# Patient Record
Sex: Male | Born: 1940 | Race: White | Hispanic: No | Smoking: Former smoker
Health system: Southern US, Community
[De-identification: ages and names within clinical notes are randomized; demographics above are authoritative.]

## PROBLEM LIST (undated history)

## (undated) DIAGNOSIS — I10 Essential (primary) hypertension: Secondary | ICD-10-CM

## (undated) DIAGNOSIS — E785 Hyperlipidemia, unspecified: Secondary | ICD-10-CM

## (undated) DIAGNOSIS — N289 Disorder of kidney and ureter, unspecified: Secondary | ICD-10-CM

## (undated) DIAGNOSIS — K219 Gastro-esophageal reflux disease without esophagitis: Secondary | ICD-10-CM

## (undated) DIAGNOSIS — R0600 Dyspnea, unspecified: Secondary | ICD-10-CM

## (undated) DIAGNOSIS — T4145XA Adverse effect of unspecified anesthetic, initial encounter: Secondary | ICD-10-CM

## (undated) DIAGNOSIS — T8859XA Other complications of anesthesia, initial encounter: Secondary | ICD-10-CM

## (undated) DIAGNOSIS — E119 Type 2 diabetes mellitus without complications: Secondary | ICD-10-CM

## (undated) DIAGNOSIS — J449 Chronic obstructive pulmonary disease, unspecified: Secondary | ICD-10-CM

## (undated) DIAGNOSIS — I509 Heart failure, unspecified: Secondary | ICD-10-CM

## (undated) DIAGNOSIS — C801 Malignant (primary) neoplasm, unspecified: Secondary | ICD-10-CM

## (undated) DIAGNOSIS — N189 Chronic kidney disease, unspecified: Secondary | ICD-10-CM

## (undated) DIAGNOSIS — N186 End stage renal disease: Secondary | ICD-10-CM

## (undated) DIAGNOSIS — T884XXA Failed or difficult intubation, initial encounter: Secondary | ICD-10-CM

## (undated) DIAGNOSIS — J90 Pleural effusion, not elsewhere classified: Secondary | ICD-10-CM

## (undated) HISTORY — DX: Chronic obstructive pulmonary disease, unspecified: J44.9

## (undated) HISTORY — PX: APPENDECTOMY: SHX54

## (undated) HISTORY — DX: Hyperlipidemia, unspecified: E78.5

## (undated) HISTORY — DX: Heart failure, unspecified: I50.9

## (undated) HISTORY — PX: JOINT REPLACEMENT: SHX530

## (undated) HISTORY — DX: Type 2 diabetes mellitus without complications: E11.9

## (undated) HISTORY — DX: Chronic kidney disease, unspecified: N18.9

## (undated) HISTORY — DX: Essential (primary) hypertension: I10

## (undated) HISTORY — PX: PROSTATE SURGERY: SHX751

## (undated) HISTORY — PX: DIALYSIS/PERMA CATHETER INSERTION: CATH118288

## (undated) HISTORY — DX: Malignant (primary) neoplasm, unspecified: C80.1

## (undated) HISTORY — PX: OTHER SURGICAL HISTORY: SHX169

---

## 2016-04-23 ENCOUNTER — Ambulatory Visit (INDEPENDENT_AMBULATORY_CARE_PROVIDER_SITE_OTHER): Payer: Medicare Other | Admitting: Vascular Surgery

## 2016-04-23 ENCOUNTER — Encounter (INDEPENDENT_AMBULATORY_CARE_PROVIDER_SITE_OTHER): Payer: Self-pay | Admitting: Vascular Surgery

## 2016-04-23 VITALS — BP 156/60 | HR 75 | Resp 14 | Ht 67.5 in | Wt 234.0 lb

## 2016-04-23 DIAGNOSIS — Z992 Dependence on renal dialysis: Secondary | ICD-10-CM | POA: Diagnosis not present

## 2016-04-23 DIAGNOSIS — I10 Essential (primary) hypertension: Secondary | ICD-10-CM | POA: Diagnosis not present

## 2016-04-23 DIAGNOSIS — N186 End stage renal disease: Secondary | ICD-10-CM | POA: Diagnosis not present

## 2016-04-23 DIAGNOSIS — E785 Hyperlipidemia, unspecified: Secondary | ICD-10-CM | POA: Diagnosis not present

## 2016-04-23 DIAGNOSIS — E1122 Type 2 diabetes mellitus with diabetic chronic kidney disease: Secondary | ICD-10-CM | POA: Diagnosis not present

## 2016-04-23 DIAGNOSIS — Z794 Long term (current) use of insulin: Secondary | ICD-10-CM

## 2016-04-23 NOTE — Assessment & Plan Note (Signed)
blood pressure control important in reducing the progression of atherosclerotic disease. On appropriate oral medications.  

## 2016-04-23 NOTE — Assessment & Plan Note (Signed)
Recommend:  At this time the patient does not have appropriate extremity access for dialysis  We will obtain a vein mapping for further evaluation of his options for fistula or graft creation. We discussed the differences between fistulas and grafts. We discussed while both are superior to catheter-based therapies.  The risks, benefits and alternative therapies were reviewed in detail with the patient.  All questions were answered.  The patient agrees to proceed with evaluation for access surgery.

## 2016-04-23 NOTE — Patient Instructions (Signed)
AV Fistula Placement Introduction Arteriovenous (AV) fistula placement is a surgical procedure to create a connection between a blood vessel that carries blood away from your heart (artery) and a blood vessel that returns blood to your heart (vein). The connection is called a fistula. It is often made in the forearm or upper arm. You may need this procedure if you are getting hemodialysis treatments for kidney disease. An AV fistula makes your vein larger and stronger over several months. This makes the vein a safe and easy spot to insert the needles that are used for hemodialysis. Tell a health care provider about:  Any allergies you have.  All medicines you are taking, including vitamins, herbs, eye drops, creams, and over-the-counter medicines.  Any problems you or family members have had with anesthetic medicines.  Any blood disorders you have.  Any surgeries you have had.  Any medical conditions you have. What are the risks? Generally, this is a safe procedure. However, problems may occur, including:  Infection.  Blood clot (thrombosis).  Reduced blood flow (stenosis).  Weakening or ballooning out of the fistula (aneurysm).  Bleeding.  Allergic reactions to medicines.  Nerve damage.  Swelling near the fistula (lymphedema).  Weakening of your heart (congestive heart failure).  Failure of the procedure. What happens before the procedure?  Imaging tests of your arm may be done to find the best place for the fistula.  Ask your health care provider about:  Changing or stopping your regular medicines. This is especially important if you are taking diabetes medicines or blood thinners.  Taking medicines such as aspirin and ibuprofen. These medicines can thin your blood. Do not take these medicines before your procedure if your health care provider instructs you not to.  Follow instructions from your health care provider about eating or drinking restrictions.  You may  be given antibiotic medicine to help prevent infection.  Ask your health care provider how your surgical site will be marked or identified.  Plan to have someone take you home after the procedure. What happens during the procedure?  To reduce your risk of infection:  Your health care team will wash or sanitize their hands.  Your skin will be washed with soap.  Hair may be removed from the surgical area.  An IV tube will be started in one of your veins.  You will be given one or more of the following:  A medicine to help you relax (sedative).  A medicine to numb the area (local anesthetic).  A medicine to make you fall asleep (general anesthetic).  A medicine that is injected into an area of your body to numb everything below the injection site (regional anesthetic).  The fistula site will be cleaned with a germ-killing solution (antiseptic).  A cut (incision) will be made on the inner side of your arm.  A vein and an artery will be opened and connected with stitches (sutures).  The incision will be closed with sutures or clips.  A bandage (dressing) will be placed over the area. The procedure may vary among health care providers and hospitals. What happens after the procedure?  Your blood pressure, heart rate, breathing rate, and blood oxygen level will be monitored often until the medicines you were given have worn off.  Your fistula site will be checked for bleeding or swelling.  You will be given pain medication as needed.  Do not drive for 24 hours if you received a sedative. This information is not intended to replace   advice given to you by your health care provider. Make sure you discuss any questions you have with your health care provider. Document Released: 02/27/2015 Document Revised: 08/24/2015 Document Reviewed: 06/08/2014  2017 Elsevier  

## 2016-04-23 NOTE — Progress Notes (Signed)
Patient ID: Calvin Good, male   DOB: 04-Sep-1940, 76 y.o.   MRN: [id number]  Chief Complaint  Patient presents with  . New Evaluation    AV graft placement    HPI Calvin Good is a 76 y.o. male.  I am asked to see the patient by Dr. Juleen China for evaluation of permanent dialysis access for renal insufficiency.  The patient is seen for evaluation for dialysis access. The patient has ESRD and has been on dialysis for two months with a catheter.  The catheter is not working that well.  This was placed in [place].  Patient's blood pressures been relatively well controlled. There are mild uremic symptoms which appear to be relatively well tolerated at this time. The patient is right-handed.  The patient has been considering the various methods of dialysis and wishes to proceed with hemodialysis and therefore creation of AV access.  The patient denies amaurosis fugax or recent TIA symptoms. There are no recent neurological changes noted. The patient denies claudication symptoms or rest pain symptoms. The patient denies history of DVT, PE or superficial thrombophlebitis. The patient denies recent episodes of angina or shortness of breath.    Past Medical History:  Diagnosis Date  . Cancer [facility])    Prostate  . CHF (congestive heart failure) ([place])   . Chronic kidney disease   . COPD (chronic obstructive pulmonary disease) ([place])   . Diabetes mellitus without complication ([place])   . Hyperlipidemia   . Hypertension     No past surgical history on file.  Family History  Problem Relation Age of Onset  . Cancer Father   No bleeding disorders, clotting disorders, or autoimmune diseases  Social History Social History  Substance Use Topics  . Smoking status: Former Research scientist (life sciences)  . Smokeless tobacco: Never Used  . Alcohol use Yes  No IV drug use  Allergies  Allergen Reactions  . Tetracyclines & Related   . Morphine Other (See Comments)    confusion    Current Outpatient Prescriptions    Medication Sig Dispense Refill  . amLODipine (NORVASC) 10 MG tablet Take 10 mg by mouth daily.    Marland Kitchen aspirin 81 MG chewable tablet Chew by mouth daily.    Marland Kitchen atorvastatin (LIPITOR) 20 MG tablet Take 20 mg by mouth daily.    . carvedilol (COREG) 25 MG tablet Take 25 mg by mouth 2 (two) times daily with a meal.    . doxazosin (CARDURA) 1 MG tablet Take 1 mg by mouth daily.    Marland Kitchen lisinopril (PRINIVIL,ZESTRIL) 2.5 MG tablet Take 2.5 mg by mouth daily.    Marland Kitchen omeprazole (PRILOSEC) 20 MG capsule Take 20 mg by mouth 2 (two) times daily before a meal.     No current facility-administered medications for this visit.       REVIEW OF SYSTEMS (Negative unless checked)  Constitutional: [] Weight loss  [] Fever  [] Chills Cardiac: [] Chest pain   [] Chest pressure   [] Palpitations   [] Shortness of breath when laying flat   [] Shortness of breath at rest   [] Shortness of breath with exertion. Vascular:  [] Pain in legs with walking   [] Pain in legs at rest   [] Pain in legs when laying flat   [] Claudication   [] Pain in feet when walking  [] Pain in feet at rest  [] Pain in feet when laying flat   [] History of DVT   [] Phlebitis   [x] Swelling in legs   [] Varicose veins   [] Non-healing ulcers Pulmonary:   [] Uses  home oxygen   [] Productive cough   [] Hemoptysis   [] Wheeze  [] COPD   [] Asthma Neurologic:  [] Dizziness  [] Blackouts   [] Seizures   [] History of stroke   [] History of TIA  [] Aphasia   [] Temporary blindness   [] Dysphagia   [] Weakness or numbness in arms   [] Weakness or numbness in legs Musculoskeletal:  [] Arthritis   [] Joint swelling   [] Joint pain   [] Low back pain Hematologic:  [] Easy bruising  [] Easy bleeding   [] Hypercoagulable state   [] Anemic  [] Hepatitis Gastrointestinal:  [] Blood in stool   [] Vomiting blood  [] Gastroesophageal reflux/heartburn   [] Abdominal pain Genitourinary:  [x] Chronic kidney disease   [] Difficult urination  [] Frequent urination  [] Burning with urination   [] Hematuria Skin:  [] Rashes    [] Ulcers   [] Wounds Psychological:  [] History of anxiety   []  History of major depression.    Physical Exam BP (!) 156/60 (BP Location: Right Arm)   Pulse 75   Resp 14   Ht 5' 7.5" (1.715 m)   Wt 234 lb (106.1 kg)   BMI 36.11 kg/m  Gen:  WD/WN, NAD Head: [place]/AT, No temporalis wasting. Prominent temp pulse not noted. Ear/Nose/Throat: Hearing grossly intact, nares w/o erythema or drainage, oropharynx w/o Erythema/Exudate Eyes: Conjunctiva clear, sclera non-icteric  Neck: trachea midline.  No JVD.  Pulmonary:  Good air movement, respirations not labored, no use of accessory muscles.  Cardiac: RRR, normal S1, S2 Vascular: PermCath in place exiting in the right subclavicular region Vessel Right Left  Radial Palpable Palpable                                   Gastrointestinal: soft, non-tender/non-distended. No guarding/reflex. No masses, surgical incisions, or scars. Musculoskeletal: M/S 5/5 throughout.  Extremities without ischemic changes.  No deformity or atrophy. 1+ lower extremity edema. Neurologic: Sensation grossly intact in extremities.  Symmetrical.  Speech is fluent. Motor exam as listed above. Psychiatric: Judgment intact, Mood & affect appropriate for pt's clinical situation. Dermatologic: No rashes or ulcers noted.  No cellulitis or open wounds. Lymph : No Cervical, Axillary, or Inguinal lymphadenopathy.   Radiology No results found.  Labs No results found for this or any previous visit (from the past 2160 hour(s)).  Assessment/Plan:  Essential hypertension, benign blood pressure control important in reducing the progression of atherosclerotic disease. On appropriate oral medications.   Hyperlipemia lipid control important in reducing the progression of atherosclerotic disease. Continue statin therapy   Diabetes ([place]) blood glucose control important in reducing the progression of atherosclerotic disease. Also, involved in wound healing. On appropriate  medications.   ESRD on dialysis [facility]) Recommend:  At this time the patient does not have appropriate extremity access for dialysis  We will obtain a vein mapping for further evaluation of his options for fistula or graft creation. We discussed the differences between fistulas and grafts. We discussed while both are superior to catheter-based therapies.  The risks, benefits and alternative therapies were reviewed in detail with the patient.  All questions were answered.  The patient agrees to proceed with evaluation for access surgery.        Leotis Pain 04/23/2016, 4:05 PM   This note was created with Dragon medical transcription system.  Any errors from dictation are unintentional.

## 2016-04-23 NOTE — Assessment & Plan Note (Signed)
lipid control important in reducing the progression of atherosclerotic disease. Continue statin therapy  

## 2016-04-23 NOTE — Assessment & Plan Note (Signed)
blood glucose control important in reducing the progression of atherosclerotic disease. Also, involved in wound healing. On appropriate medications.  

## 2016-05-03 ENCOUNTER — Encounter (INDEPENDENT_AMBULATORY_CARE_PROVIDER_SITE_OTHER): Payer: Medicare Other | Admitting: Vascular Surgery

## 2016-05-13 ENCOUNTER — Encounter: Payer: Self-pay | Admitting: Podiatry

## 2016-05-13 ENCOUNTER — Ambulatory Visit (INDEPENDENT_AMBULATORY_CARE_PROVIDER_SITE_OTHER): Payer: Medicare Other | Admitting: Podiatry

## 2016-05-13 DIAGNOSIS — E1149 Type 2 diabetes mellitus with other diabetic neurological complication: Secondary | ICD-10-CM | POA: Diagnosis not present

## 2016-05-13 DIAGNOSIS — B351 Tinea unguium: Secondary | ICD-10-CM

## 2016-05-13 DIAGNOSIS — M205X9 Other deformities of toe(s) (acquired), unspecified foot: Secondary | ICD-10-CM | POA: Diagnosis not present

## 2016-05-13 DIAGNOSIS — M2020 Hallux rigidus, unspecified foot: Secondary | ICD-10-CM

## 2016-05-13 NOTE — Progress Notes (Signed)
   Subjective:    Patient ID: Calvin Good, male    DOB: 04-02-1940, 76 y.o.   MRN: [id number]  Diabetes   This patient presents to the office  for preventive footcare services. He says his nails are painful walking and wearing his shoes.  He is diabetic and requests diabetic shoes.  He admits to pain in calf walking.  He presents to the office for evaluation and treatment.   Review of Systems  HENT: Positive for hearing loss.   Respiratory: Positive for apnea, chest tightness and shortness of breath.   Cardiovascular: Positive for leg swelling.  Endocrine: Positive for cold intolerance.  Musculoskeletal:       Muscle pain in calf        Objective:   Physical Exam GENERAL APPEARANCE: Alert, conversant. Appropriately groomed. No acute distress.  VASCULAR: Pedal pulses are barely  palpable at  [facility] and PT bilateral.  Capillary refill time is immediate to all digits,  Normal temperature gradient.   NEUROLOGIC: sensation is absent to 5.07 monofilament at 5/5 sites left foot.  LOPS barely felt right foot.  Light touch is intact bilateral, Muscle strength normal.  MUSCULOSKELETAL: acceptable muscle strength, tone and stability bilateral.  Intrinsic muscluature intact bilateral.  Limited ROM  1st MPJ  B/L  DERMATOLOGIC: skin color, texture, and turgor are within normal limits.  No preulcerative lesions or ulcers  are seen, no interdigital maceration noted.  No open lesions present.   No drainage noted.  NAILS  Thick disfigured discolored nails both feet.         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   Gardiner Barefoot DPM

## 2016-05-14 ENCOUNTER — Ambulatory Visit (INDEPENDENT_AMBULATORY_CARE_PROVIDER_SITE_OTHER): Payer: Medicare Other

## 2016-05-14 ENCOUNTER — Encounter (INDEPENDENT_AMBULATORY_CARE_PROVIDER_SITE_OTHER): Payer: Self-pay | Admitting: Vascular Surgery

## 2016-05-14 ENCOUNTER — Ambulatory Visit (INDEPENDENT_AMBULATORY_CARE_PROVIDER_SITE_OTHER): Payer: Medicare Other | Admitting: Vascular Surgery

## 2016-05-14 ENCOUNTER — Encounter (INDEPENDENT_AMBULATORY_CARE_PROVIDER_SITE_OTHER): Payer: Self-pay

## 2016-05-14 VITALS — BP 149/61 | HR 72 | Resp 16 | Wt 233.0 lb

## 2016-05-14 DIAGNOSIS — Z992 Dependence on renal dialysis: Secondary | ICD-10-CM

## 2016-05-14 DIAGNOSIS — I10 Essential (primary) hypertension: Secondary | ICD-10-CM | POA: Diagnosis not present

## 2016-05-14 DIAGNOSIS — E785 Hyperlipidemia, unspecified: Secondary | ICD-10-CM

## 2016-05-14 DIAGNOSIS — N186 End stage renal disease: Secondary | ICD-10-CM | POA: Diagnosis not present

## 2016-05-14 DIAGNOSIS — E1122 Type 2 diabetes mellitus with diabetic chronic kidney disease: Secondary | ICD-10-CM

## 2016-05-14 NOTE — Assessment & Plan Note (Signed)
His vein mapping study today shows adequate cephalic vein throughout the right upper extremity and from the antecubital fossa proximally in the left upper extremity. He has triphasic arterial waveforms throughout both upper extremities. He is right-hand dominant. Given these findings, a left brachiocephalic AV fistula will be created. Risks and benefits of this procedure were discussed including non-maturation, need for adjuvant treatments, bleeding, infection, and injury to nearby structures. In addition, he has elected to have a peritoneal dialysis catheter placed. He would like to do the simultaneously which is certainly reasonable. I discussed the risks and benefits of peritoneal dialysis catheter placement as well. These will be scheduled at his convenience in the near future.

## 2016-05-14 NOTE — Progress Notes (Signed)
MRN : [id number]  Calvin Good is a 76 y.o. (02-16-1941) male who presents with chief complaint of  Chief Complaint  Patient presents with  . Follow-up  .  History of Present Illness: Patient returns today in follow up of renal failure and evaluation for dialysis access.  He wants to do peritoneal dialysis, but has decided to have a fistula placed as well as a PD catheter to have access both ways.  His vein mapping study today Good adequate cephalic vein throughout the right upper extremity and from the antecubital fossa proximally in the left upper extremity. He has triphasic arterial waveforms throughout both upper extremities. He is right-hand dominant.       Past Medical History:  Diagnosis Date  . Cancer [facility])    Prostate  . CHF (congestive heart failure) ([place])   . Chronic kidney disease   . COPD (chronic obstructive pulmonary disease) ([place])   . Diabetes mellitus without complication ([place])   . Hyperlipidemia   . Hypertension     No past surgical history on file.       Family History  Problem Relation Age of Onset  . Cancer Father   No bleeding disorders, clotting disorders, or autoimmune diseases  Social History     Social History  Substance Use Topics  . Smoking status: Former Research scientist (life sciences)  . Smokeless tobacco: Never Used  . Alcohol use Yes  No IV drug use       Allergies  Allergen Reactions  . Tetracyclines & Related   . Morphine Other (See Comments)    confusion          Current Outpatient Prescriptions  Medication Sig Dispense Refill  . amLODipine (NORVASC) 10 MG tablet Take 10 mg by mouth daily.    Marland Kitchen aspirin 81 MG chewable tablet Chew by mouth daily.    Marland Kitchen atorvastatin (LIPITOR) 20 MG tablet Take 20 mg by mouth daily.    . carvedilol (COREG) 25 MG tablet Take 25 mg by mouth 2 (two) times daily with a meal.    . doxazosin (CARDURA) 1 MG tablet Take 1 mg by mouth daily.    Marland Kitchen lisinopril (PRINIVIL,ZESTRIL) 2.5 MG tablet Take  2.5 mg by mouth daily.    Marland Kitchen omeprazole (PRILOSEC) 20 MG capsule Take 20 mg by mouth 2 (two) times daily before a meal.     No current facility-administered medications for this visit.       REVIEW OF SYSTEMS (Negative unless checked)  Constitutional: [] Weight loss  [] Fever  [] Chills Cardiac: [] Chest pain   [] Chest pressure   [] Palpitations   [] Shortness of breath when laying flat   [] Shortness of breath at rest   [] Shortness of breath with exertion. Vascular:  [] Pain in legs with walking   [] Pain in legs at rest   [] Pain in legs when laying flat   [] Claudication   [] Pain in feet when walking  [] Pain in feet at rest  [] Pain in feet when laying flat   [] History of DVT   [] Phlebitis   [x] Swelling in legs   [] Varicose veins   [] Non-healing ulcers Pulmonary:   [] Uses home oxygen   [] Productive cough   [] Hemoptysis   [] Wheeze  [] COPD   [] Asthma Neurologic:  [] Dizziness  [] Blackouts   [] Seizures   [] History of stroke   [] History of TIA  [] Aphasia   [] Temporary blindness   [] Dysphagia   [] Weakness or numbness in arms   [] Weakness or numbness in legs Musculoskeletal:  [] Arthritis   []   Joint swelling   [] Joint pain   [] Low back pain Hematologic:  [] Easy bruising  [] Easy bleeding   [] Hypercoagulable state   [] Anemic  [] Hepatitis Gastrointestinal:  [] Blood in stool   [] Vomiting blood  [] Gastroesophageal reflux/heartburn   [] Abdominal pain Genitourinary:  [x] Chronic kidney disease   [] Difficult urination  [] Frequent urination  [] Burning with urination   [] Hematuria Skin:  [] Rashes   [] Ulcers   [] Wounds Psychological:  [] History of anxiety   []  History of major depression.    Physical Exam BP (!) 156/60 (BP Location: Right Arm)   Pulse 75   Resp 14   Ht 5' 7.5" (1.715 m)   Wt 234 lb (106.1 kg)   BMI 36.11 kg/m  Gen:  WD/WN, NAD Head: [place]/AT, No temporalis wasting. Prominent temp pulse not noted. Ear/Nose/Throat: Hearing grossly intact, nares w/o erythema or drainage, oropharynx w/o  Erythema/Exudate Eyes: Conjunctiva clear, sclera non-icteric  Neck: trachea midline.  No JVD.  Pulmonary:  Good air movement, respirations not labored, no use of accessory muscles.  Cardiac: RRR, normal S1, S2 Vascular: PermCath in place exiting in the right subclavicular region Vessel Right Left  Radial Palpable Palpable                                   Gastrointestinal: soft, non-tender/non-distended. No guarding/reflex. No masses, surgical incisions, or scars. Musculoskeletal: M/S 5/5 throughout.  Extremities without ischemic changes.  No deformity or atrophy. 1+ lower extremity edema. Neurologic: Sensation grossly intact in extremities.  Symmetrical.  Speech is fluent. Motor exam as listed above. Psychiatric: Judgment intact, Mood & affect appropriate for pt's clinical situation. Dermatologic: No rashes or ulcers noted.  No cellulitis or open wounds. Lymph : No Cervical, Axillary, or Inguinal lymphadenopathy.    Labs No results found for this or any previous visit (from the past 2160 hour(s)).  Radiology No results found.    Assessment/Plan  Essential hypertension, benign blood pressure control important in reducing the progression of atherosclerotic disease. On appropriate oral medications.   Hyperlipemia lipid control important in reducing the progression of atherosclerotic disease. Continue statin therapy   Diabetes ([place]) blood glucose control important in reducing the progression of atherosclerotic disease. Also, involved in wound healing. On appropriate medications.  ESRD on dialysis [facility]) His vein mapping study today Good adequate cephalic vein throughout the right upper extremity and from the antecubital fossa proximally in the left upper extremity. He has triphasic arterial waveforms throughout both upper extremities. He is right-hand dominant. Given these findings, a left brachiocephalic AV fistula will be created. Risks  and benefits of this procedure were discussed including non-maturation, need for adjuvant treatments, bleeding, infection, and injury to nearby structures. In addition, he has elected to have a peritoneal dialysis catheter placed. He would like to do the simultaneously which is certainly reasonable. I discussed the risks and benefits of peritoneal dialysis catheter placement as well. These will be scheduled at his convenience in the near future.    Leotis Pain, MD  05/14/2016 2:27 PM    This note was created with Dragon medical transcription system.  Any errors from dictation are purely unintentional

## 2016-05-16 ENCOUNTER — Other Ambulatory Visit (INDEPENDENT_AMBULATORY_CARE_PROVIDER_SITE_OTHER): Payer: Self-pay

## 2016-05-16 DIAGNOSIS — N186 End stage renal disease: Secondary | ICD-10-CM

## 2016-05-16 DIAGNOSIS — I5032 Chronic diastolic (congestive) heart failure: Secondary | ICD-10-CM | POA: Insufficient documentation

## 2016-05-16 DIAGNOSIS — Z992 Dependence on renal dialysis: Principal | ICD-10-CM

## 2016-05-16 DIAGNOSIS — C61 Malignant neoplasm of prostate: Secondary | ICD-10-CM | POA: Insufficient documentation

## 2016-05-16 DIAGNOSIS — E1142 Type 2 diabetes mellitus with diabetic polyneuropathy: Secondary | ICD-10-CM | POA: Insufficient documentation

## 2016-05-21 ENCOUNTER — Encounter
Admission: RE | Admit: 2016-05-21 | Discharge: 2016-05-21 | Disposition: A | Discharge status: Home / Self Care | Payer: Medicare Other | Source: Ambulatory Visit | Attending: Vascular Surgery | Admitting: Vascular Surgery

## 2016-05-21 DIAGNOSIS — Z01812 Encounter for preprocedural laboratory examination: Secondary | ICD-10-CM | POA: Diagnosis present

## 2016-05-21 DIAGNOSIS — I10 Essential (primary) hypertension: Secondary | ICD-10-CM | POA: Diagnosis present

## 2016-05-21 DIAGNOSIS — Z01818 Encounter for other preprocedural examination: Secondary | ICD-10-CM

## 2016-05-21 DIAGNOSIS — Z01810 Encounter for preprocedural cardiovascular examination: Secondary | ICD-10-CM | POA: Insufficient documentation

## 2016-05-21 HISTORY — DX: Gastro-esophageal reflux disease without esophagitis: K21.9

## 2016-05-21 HISTORY — DX: Dyspnea, unspecified: R06.00

## 2016-05-21 LAB — BASIC METABOLIC PANEL
Anion gap: 11 (ref 5–15)
BUN: 46 mg/dL — ABNORMAL HIGH (ref 6–20)
CO2: 25 mmol/L (ref 22–32)
Calcium: 8.3 mg/dL — ABNORMAL LOW (ref 8.9–10.3)
Chloride: 95 mmol/L — ABNORMAL LOW (ref 101–111)
Creatinine, Ser: 5.01 mg/dL — ABNORMAL HIGH (ref 0.61–1.24)
GFR calc non Af Amer: 10 mL/min — ABNORMAL LOW (ref >60)
GFR, EST AFRICAN AMERICAN: 12 mL/min — AB (ref >60)
Glucose, Bld: 224 mg/dL — ABNORMAL HIGH (ref 65–99)
Potassium: 3.3 mmol/L — ABNORMAL LOW (ref 3.5–5.1)
SODIUM: 131 mmol/L — AB (ref 135–145)

## 2016-05-21 LAB — CBC
HEMATOCRIT: 33 % — AB (ref 40.0–52.0)
Hemoglobin: 11.7 g/dL — ABNORMAL LOW (ref 13.0–18.0)
MCH: 30.3 pg (ref 26.0–34.0)
MCHC: 35.4 g/dL (ref 32.0–36.0)
MCV: 85.6 fL (ref 80.0–100.0)
PLATELETS: 177 10*3/uL (ref 150–440)
RBC: 3.85 MIL/uL — ABNORMAL LOW (ref 4.40–5.90)
RDW: 13.8 % (ref 11.5–14.5)
WBC: 10.4 10*3/uL (ref 3.8–10.6)

## 2016-05-21 LAB — TYPE AND SCREEN
ABO/RH(D): O POS
Antibody Screen: NEGATIVE

## 2016-05-21 LAB — SURGICAL PCR SCREEN
MRSA, PCR: NEGATIVE
STAPHYLOCOCCUS AUREUS: NEGATIVE

## 2016-05-21 NOTE — Patient Instructions (Signed)
  Your procedure is scheduled on: 05/30/16 Thurs Report to Same Day Surgery 2nd floor medical mall [facility] Entrance-take elevator on left to 2nd floor.  Check in with surgery information desk.) To find out your arrival time please call [phone] between 1PM - 3PM on 05/29/16 Wed  Remember: Instructions that are not followed completely may result in serious medical risk, up to and including death, or upon the discretion of your surgeon and anesthesiologist your surgery may need to be rescheduled.    _x___ 1. Do not eat food or drink liquids after midnight. No gum chewing or hard candies.     __x__ 2. No Alcohol for 24 hours before or after surgery.   __x__3. No Smoking for 24 prior to surgery.   ____  4. Bring all medications with you on the day of surgery if instructed.    __x__ 5. Notify your doctor if there is any change in your medical condition     (cold, fever, infections).     Do not wear jewelry, make-up, hairpins, clips or nail polish.  Do not wear lotions, powders, or perfumes. You may wear deodorant.  Do not shave 48 hours prior to surgery. Men may shave face and neck.  Do not bring valuables to the hospital.    [facility] is not responsible for any belongings or valuables.               Contacts, dentures or bridgework may not be worn into surgery.  Leave your suitcase in the car. After surgery it may be brought to your room.  For patients admitted to the hospital, discharge time is determined by your treatment team.   Patients discharged the day of surgery will not be allowed to drive home.  You will need someone to drive you home and stay with you the night of your procedure.    Please read over the following fact sheets that you were given:   [facility] Preparing for Surgery and or MRSA Information   _x___ Take these medicines the morning of surgery with A SIP OF WATER:    1. atorvastatin (LIPITOR  2.carvedilol (COREG  3.doxazosin (CARDURA  4.omeprazole  (PRILOSEC)   5.  6.  ____Fleets enema or Magnesium Citrate as directed.   _x___ Use CHG Soap or sage wipes as directed on instruction sheet   ____ Use inhalers on the day of surgery and bring to hospital day of surgery  ____ Stop metformin 2 days prior to surgery    _x___ Take 1/2 of usual insulin dose the night before surgery and none on the morning of           surgery.   ____ Stop Aspirin, Coumadin, Pllavix ,Eliquis, Effient, or Pradaxa  x__ Stop Anti-inflammatories such as Advil, Aleve, Ibuprofen, Motrin, Naproxen,          Naprosyn, Goodies powders or aspirin products. Ok to take Tylenol.   ____ Stop supplements until after surgery.    ____ Bring C-Pap to the hospital.

## 2016-05-21 NOTE — Pre-Procedure Instructions (Signed)
MEDICAL CLEARANCE REQUEST/EKG,AS INSTRUCTED BY DR Donata Duff, CALLED AND FAXED TO DR Ouida Sills AND DR Lucky Cowboy. LM AT NURSES'S LINE DR Lucky Cowboy AND SPOKE WITH ASHLEY AT DR ANDERSON'S

## 2016-05-21 NOTE — Pre-Procedure Instructions (Signed)
Abnormal EKG reported to [facility].

## 2016-05-21 NOTE — Pre-Procedure Instructions (Signed)
Potassium 3.3 a supplement requested.

## 2016-05-23 NOTE — Pre-Procedure Instructions (Signed)
CLEARED BY DR Ouida Sills. SUGGESTS NEPHROLOGY FOLLOW CLOSE FOR FLUID STATUS POST OP

## 2016-05-30 ENCOUNTER — Encounter: Payer: Self-pay | Admitting: Anesthesiology

## 2016-05-30 ENCOUNTER — Ambulatory Visit
Admission: RE | Admit: 2016-05-30 | Discharge: 2016-05-30 | Disposition: A | Discharge status: Home / Self Care | Payer: Medicare Other | Source: Ambulatory Visit | Attending: Vascular Surgery | Admitting: Vascular Surgery

## 2016-05-30 ENCOUNTER — Encounter
Admission: RE | Disposition: A | Discharge status: Home / Self Care | Payer: Self-pay | Source: Ambulatory Visit | Attending: Vascular Surgery

## 2016-05-30 ENCOUNTER — Ambulatory Visit: Payer: Medicare Other | Admitting: Anesthesiology

## 2016-05-30 DIAGNOSIS — Z992 Dependence on renal dialysis: Secondary | ICD-10-CM | POA: Insufficient documentation

## 2016-05-30 DIAGNOSIS — Z881 Allergy status to other antibiotic agents status: Secondary | ICD-10-CM | POA: Insufficient documentation

## 2016-05-30 DIAGNOSIS — K219 Gastro-esophageal reflux disease without esophagitis: Secondary | ICD-10-CM | POA: Diagnosis not present

## 2016-05-30 DIAGNOSIS — I5032 Chronic diastolic (congestive) heart failure: Secondary | ICD-10-CM | POA: Insufficient documentation

## 2016-05-30 DIAGNOSIS — Z87891 Personal history of nicotine dependence: Secondary | ICD-10-CM | POA: Diagnosis not present

## 2016-05-30 DIAGNOSIS — E1122 Type 2 diabetes mellitus with diabetic chronic kidney disease: Secondary | ICD-10-CM | POA: Insufficient documentation

## 2016-05-30 DIAGNOSIS — J449 Chronic obstructive pulmonary disease, unspecified: Secondary | ICD-10-CM | POA: Diagnosis not present

## 2016-05-30 DIAGNOSIS — Z794 Long term (current) use of insulin: Secondary | ICD-10-CM | POA: Diagnosis not present

## 2016-05-30 DIAGNOSIS — Z8546 Personal history of malignant neoplasm of prostate: Secondary | ICD-10-CM | POA: Diagnosis not present

## 2016-05-30 DIAGNOSIS — Z885 Allergy status to narcotic agent status: Secondary | ICD-10-CM | POA: Diagnosis not present

## 2016-05-30 DIAGNOSIS — E1142 Type 2 diabetes mellitus with diabetic polyneuropathy: Secondary | ICD-10-CM | POA: Insufficient documentation

## 2016-05-30 DIAGNOSIS — I132 Hypertensive heart and chronic kidney disease with heart failure and with stage 5 chronic kidney disease, or end stage renal disease: Secondary | ICD-10-CM | POA: Insufficient documentation

## 2016-05-30 DIAGNOSIS — Z7982 Long term (current) use of aspirin: Secondary | ICD-10-CM | POA: Insufficient documentation

## 2016-05-30 DIAGNOSIS — N186 End stage renal disease: Secondary | ICD-10-CM | POA: Diagnosis not present

## 2016-05-30 DIAGNOSIS — E782 Mixed hyperlipidemia: Secondary | ICD-10-CM | POA: Diagnosis not present

## 2016-05-30 HISTORY — PX: CAPD INSERTION: SHX5233

## 2016-05-30 HISTORY — DX: Failed or difficult intubation, initial encounter: T88.4XXA

## 2016-05-30 HISTORY — PX: AV FISTULA PLACEMENT: SHX1204

## 2016-05-30 LAB — GLUCOSE, CAPILLARY: Glucose-Capillary: 279 mg/dL — ABNORMAL HIGH (ref 65–99)

## 2016-05-30 LAB — POCT I-STAT 4, (NA,K, GLUC, HGB,HCT)
GLUCOSE: 299 mg/dL — AB (ref 65–99)
HCT: 33 % — ABNORMAL LOW (ref 39.0–52.0)
Hemoglobin: 11.2 g/dL — ABNORMAL LOW (ref 13.0–17.0)
Potassium: 3.7 mmol/L (ref 3.5–5.1)
Sodium: 132 mmol/L — ABNORMAL LOW (ref 135–145)

## 2016-05-30 LAB — ABO/RH: ABO/RH(D): O POS

## 2016-05-30 SURGERY — ARTERIOVENOUS (AV) FISTULA CREATION
Anesthesia: General | Wound class: Clean

## 2016-05-30 MED ORDER — PHENYLEPHRINE HCL 10 MG/ML IJ SOLN
INTRAMUSCULAR | Status: DC | PRN
  Administered 2016-05-30 (×2): 100 ug via INTRAVENOUS

## 2016-05-30 MED ORDER — ROPIVACAINE HCL 5 MG/ML IJ SOLN
INTRAMUSCULAR | Status: DC | PRN
  Administered 2016-05-30: 20 mL via PERINEURAL
  Administered 2016-05-30: 10 mL via PERINEURAL

## 2016-05-30 MED ORDER — MIDAZOLAM HCL 2 MG/2ML IJ SOLN
1.0000 mg | Freq: Once | INTRAMUSCULAR | Status: AC
Start: 2016-05-30 — End: 2016-05-30
  Administered 2016-05-30: 1 mg via INTRAVENOUS

## 2016-05-30 MED ORDER — GLYCOPYRROLATE 0.2 MG/ML IJ SOLN
INTRAMUSCULAR | Status: DC | PRN
  Administered 2016-05-30: 1 mg via INTRAVENOUS

## 2016-05-30 MED ORDER — SODIUM CHLORIDE 0.9 % IV SOLN
INTRAVENOUS | Status: DC | PRN
  Administered 2016-05-30: 30 ug/min via INTRAVENOUS

## 2016-05-30 MED ORDER — ROCURONIUM BROMIDE 100 MG/10ML IV SOLN
INTRAVENOUS | Status: DC | PRN
  Administered 2016-05-30: 30 mg via INTRAVENOUS
  Administered 2016-05-30 (×2): 5 mg via INTRAVENOUS

## 2016-05-30 MED ORDER — FENTANYL CITRATE (PF) 100 MCG/2ML IJ SOLN
25.0000 ug | INTRAMUSCULAR | Status: AC | PRN
  Administered 2016-05-30 (×6): 25 ug via INTRAVENOUS

## 2016-05-30 MED ORDER — GLYCOPYRROLATE 0.2 MG/ML IJ SOLN
INTRAMUSCULAR | Status: AC
  Filled 2016-05-30: qty 5

## 2016-05-30 MED ORDER — MIDAZOLAM HCL 2 MG/2ML IJ SOLN
INTRAMUSCULAR | Status: AC
  Administered 2016-05-30: 1 mg via INTRAVENOUS
  Filled 2016-05-30: qty 2

## 2016-05-30 MED ORDER — SODIUM CHLORIDE 0.9 % IV SOLN
INTRAVENOUS | Status: DC
  Administered 2016-05-30: 11:00:00 via INTRAVENOUS

## 2016-05-30 MED ORDER — OXYCODONE HCL 5 MG PO TABS
5.0000 mg | ORAL_TABLET | Freq: Once | ORAL | Status: AC | PRN
  Administered 2016-05-30: 5 mg via ORAL

## 2016-05-30 MED ORDER — ROPIVACAINE HCL 5 MG/ML IJ SOLN
INTRAMUSCULAR | Status: AC
  Filled 2016-05-30: qty 40

## 2016-05-30 MED ORDER — CEFAZOLIN IN D5W 1 GM/50ML IV SOLN
INTRAVENOUS | Status: AC
  Filled 2016-05-30: qty 50

## 2016-05-30 MED ORDER — LIDOCAINE HCL (PF) 1 % IJ SOLN
INTRAMUSCULAR | Status: AC
  Filled 2016-05-30: qty 5

## 2016-05-30 MED ORDER — HEPARIN SODIUM (PORCINE) 5000 UNIT/ML IJ SOLN
INTRAMUSCULAR | Status: AC
  Filled 2016-05-30: qty 1

## 2016-05-30 MED ORDER — ONDANSETRON HCL 4 MG/2ML IJ SOLN
INTRAMUSCULAR | Status: DC | PRN
  Administered 2016-05-30: 4 mg via INTRAVENOUS

## 2016-05-30 MED ORDER — OXYCODONE HCL 5 MG PO TABS
ORAL_TABLET | ORAL | Status: AC
  Administered 2016-05-30: 5 mg via ORAL
  Filled 2016-05-30: qty 1

## 2016-05-30 MED ORDER — LIDOCAINE HCL (PF) 1 % IJ SOLN
INTRAMUSCULAR | Status: AC
  Filled 2016-05-30: qty 2

## 2016-05-30 MED ORDER — NEOSTIGMINE METHYLSULFATE 5 MG/5ML IV SOSY
PREFILLED_SYRINGE | INTRAVENOUS | Status: AC
Start: 2016-05-30 — End: ?
  Filled 2016-05-30: qty 5

## 2016-05-30 MED ORDER — ONDANSETRON HCL 4 MG/2ML IJ SOLN
INTRAMUSCULAR | Status: AC
  Filled 2016-05-30: qty 2

## 2016-05-30 MED ORDER — SODIUM CHLORIDE 0.9 % IV SOLN: INTRAVENOUS | Status: DC

## 2016-05-30 MED ORDER — SUCCINYLCHOLINE CHLORIDE 20 MG/ML IJ SOLN
INTRAMUSCULAR | Status: AC
Start: 2016-05-30 — End: ?
  Filled 2016-05-30: qty 1

## 2016-05-30 MED ORDER — LIDOCAINE HCL (PF) 2 % IJ SOLN
INTRAMUSCULAR | Status: AC
  Filled 2016-05-30: qty 2

## 2016-05-30 MED ORDER — HEPARIN SODIUM (PORCINE) 1000 UNIT/ML IJ SOLN
INTRAMUSCULAR | Status: DC | PRN
Start: 2016-05-30 — End: 2016-05-30
  Administered 2016-05-30: 3000 [IU] via INTRAVENOUS

## 2016-05-30 MED ORDER — FENTANYL CITRATE (PF) 100 MCG/2ML IJ SOLN
INTRAMUSCULAR | Status: AC
  Administered 2016-05-30: 25 ug via INTRAVENOUS
  Filled 2016-05-30: qty 2

## 2016-05-30 MED ORDER — CEFAZOLIN IN D5W 1 GM/50ML IV SOLN
INTRAVENOUS | Status: DC | PRN
  Administered 2016-05-30: 1 g via INTRAVENOUS

## 2016-05-30 MED ORDER — CEFAZOLIN IN D5W 1 GM/50ML IV SOLN: 1.0000 g | Freq: Once | INTRAVENOUS | Status: DC

## 2016-05-30 MED ORDER — PAPAVERINE HCL 30 MG/ML IJ SOLN
INTRAMUSCULAR | Status: AC
  Filled 2016-05-30: qty 2

## 2016-05-30 MED ORDER — OXYCODONE HCL 5 MG/5ML PO SOLN: 5.0000 mg | Freq: Once | ORAL | Status: AC | PRN

## 2016-05-30 MED ORDER — NEOSTIGMINE METHYLSULFATE 10 MG/10ML IV SOLN
INTRAVENOUS | Status: DC | PRN
  Administered 2016-05-30: 5 mg via INTRAVENOUS

## 2016-05-30 MED ORDER — LIDOCAINE HCL (CARDIAC) 20 MG/ML IV SOLN
INTRAVENOUS | Status: DC | PRN
  Administered 2016-05-30: 80 mg via INTRAVENOUS

## 2016-05-30 MED ORDER — HYDROCODONE-ACETAMINOPHEN 5-325 MG PO TABS: 1.0000 | ORAL_TABLET | Freq: Four times a day (QID) | ORAL | 0 refills | Status: DC | PRN

## 2016-05-30 MED ORDER — SUCCINYLCHOLINE CHLORIDE 20 MG/ML IJ SOLN
INTRAMUSCULAR | Status: DC | PRN
  Administered 2016-05-30: 120 mg via INTRAVENOUS

## 2016-05-30 MED ORDER — LIDOCAINE HCL (PF) 1 % IJ SOLN
INTRAMUSCULAR | Status: DC | PRN
  Administered 2016-05-30: 1 mL via INTRADERMAL

## 2016-05-30 MED ORDER — BUPIVACAINE-EPINEPHRINE (PF) 0.5% -1:200000 IJ SOLN
INTRAMUSCULAR | Status: AC
  Filled 2016-05-30: qty 30

## 2016-05-30 MED ORDER — PROPOFOL 10 MG/ML IV BOLUS
INTRAVENOUS | Status: AC
  Filled 2016-05-30: qty 20

## 2016-05-30 MED ORDER — FENTANYL CITRATE (PF) 100 MCG/2ML IJ SOLN
INTRAMUSCULAR | Status: DC | PRN
  Administered 2016-05-30: 50 ug via INTRAVENOUS
  Administered 2016-05-30: 25 ug via INTRAVENOUS

## 2016-05-30 MED ORDER — FENTANYL CITRATE (PF) 100 MCG/2ML IJ SOLN
INTRAMUSCULAR | Status: AC
  Filled 2016-05-30: qty 2

## 2016-05-30 MED ORDER — ROCURONIUM BROMIDE 50 MG/5ML IV SOLN
INTRAVENOUS | Status: AC
  Filled 2016-05-30: qty 1

## 2016-05-30 MED ORDER — PROPOFOL 10 MG/ML IV BOLUS
INTRAVENOUS | Status: DC | PRN
  Administered 2016-05-30: 150 mg via INTRAVENOUS

## 2016-05-30 SURGICAL SUPPLY — 67 items
ADAPTER BETA CAP QUINTON DIALY (ADAPTER) IMPLANT
ADAPTER CATH DIALYSIS 18.75 (CATHETERS) ×3 IMPLANT
ADAPTER CATH DIALYSIS 18.75CM (CATHETERS) ×1
BAG DECANTER FOR FLEXI CONT (MISCELLANEOUS) ×4 IMPLANT
BLADE CLIPPER SURG (BLADE) ×4 IMPLANT
BLADE SURG SZ11 CARB STEEL (BLADE) ×4 IMPLANT
BOOT SUTURE AID YELLOW STND (SUTURE) ×4 IMPLANT
BRUSH SCRUB 4% CHG (MISCELLANEOUS) ×4 IMPLANT
CANISTER SUCT 1200ML W/VALVE (MISCELLANEOUS) ×4 IMPLANT
CATH DLYS SWAN NECK 62.5CM (CATHETERS) ×4 IMPLANT
CHLORAPREP W/TINT 26ML (MISCELLANEOUS) ×4 IMPLANT
CLIP SPRNG 6MM S-JAW DBL (CLIP) ×4
COVER LIGHT HANDLE STERIS (MISCELLANEOUS) ×8 IMPLANT
DERMABOND ADVANCED (GAUZE/BANDAGES/DRESSINGS) ×4
DERMABOND ADVANCED .7 DNX12 (GAUZE/BANDAGES/DRESSINGS) ×4 IMPLANT
ELECT CAUTERY BLADE 6.4 (BLADE) ×4 IMPLANT
ELECT REM PT RETURN 9FT ADLT (ELECTROSURGICAL) ×4
ELECTRODE REM PT RTRN 9FT ADLT (ELECTROSURGICAL) ×2 IMPLANT
GAUZE SPONGE 4X4 12PLY STRL (GAUZE/BANDAGES/DRESSINGS) ×4 IMPLANT
GEL ULTRASOUND 20GR AQUASONIC (MISCELLANEOUS) IMPLANT
GLOVE BIO SURGEON STRL SZ7 (GLOVE) ×20 IMPLANT
GLOVE INDICATOR 7.5 STRL GRN (GLOVE) ×4 IMPLANT
GOWN STRL REUS W/ TWL LRG LVL3 (GOWN DISPOSABLE) ×6 IMPLANT
GOWN STRL REUS W/ TWL XL LVL3 (GOWN DISPOSABLE) ×2 IMPLANT
GOWN STRL REUS W/TWL LRG LVL3 (GOWN DISPOSABLE) ×6
GOWN STRL REUS W/TWL XL LVL3 (GOWN DISPOSABLE) ×2
HEMOSTAT SURGICEL 2X3 (HEMOSTASIS) ×4 IMPLANT
IV NS 500ML (IV SOLUTION) ×2
IV NS 500ML BAXH (IV SOLUTION) ×2 IMPLANT
KIT RM TURNOVER STRD PROC AR (KITS) ×4 IMPLANT
LABEL OR SOLS (LABEL) ×4 IMPLANT
LOOP RED MAXI  1X406MM (MISCELLANEOUS) ×2
LOOP VESSEL MAXI 1X406 RED (MISCELLANEOUS) ×2 IMPLANT
LOOP VESSEL MINI 0.8X406 BLUE (MISCELLANEOUS) ×2 IMPLANT
LOOPS BLUE MINI 0.8X406MM (MISCELLANEOUS) ×2
MINICAP W/POVIDONE IODINE SOL (MISCELLANEOUS) ×4 IMPLANT
NEEDLE FILTER BLUNT 18X 1/2SAF (NEEDLE) ×2
NEEDLE FILTER BLUNT 18X1 1/2 (NEEDLE) ×2 IMPLANT
NEEDLE HYPO 30X.5 LL (NEEDLE) IMPLANT
NS IRRIG 500ML POUR BTL (IV SOLUTION) ×4 IMPLANT
PACK EXTREMITY ARMC (MISCELLANEOUS) ×4 IMPLANT
PACK LAP CHOLECYSTECTOMY (MISCELLANEOUS) ×4 IMPLANT
PAD PREP 24X41 OB/GYN DISP (PERSONAL CARE ITEMS) ×4 IMPLANT
PENCIL ELECTRO HAND CTR (MISCELLANEOUS) ×4 IMPLANT
SET CYSTO W/LG BORE CLAMP LF (SET/KITS/TRAYS/PACK) ×4 IMPLANT
SET TRANSFER 6 W/TWIST CLAMP 5 (SET/KITS/TRAYS/PACK) ×4 IMPLANT
SOLUTION CELL SAVER (CLIP) ×2 IMPLANT
SPONGE VERSALON 4X4 4PLY (MISCELLANEOUS) ×4 IMPLANT
STOCKINETTE STRL 4IN 9604848 (GAUZE/BANDAGES/DRESSINGS) ×4 IMPLANT
SUT MNCRL AB 4-0 PS2 18 (SUTURE) ×8 IMPLANT
SUT PROLENE 6 0 BV (SUTURE) ×16 IMPLANT
SUT SILK 2 0 (SUTURE) ×2
SUT SILK 2-0 18XBRD TIE 12 (SUTURE) ×2 IMPLANT
SUT SILK 3 0 (SUTURE) ×2
SUT SILK 3-0 18XBRD TIE 12 (SUTURE) ×2 IMPLANT
SUT SILK 4 0 (SUTURE) ×2
SUT SILK 4-0 18XBRD TIE 12 (SUTURE) ×2 IMPLANT
SUT VIC AB 2-0 UR6 27 (SUTURE) ×4 IMPLANT
SUT VIC AB 3-0 SH 27 (SUTURE) ×4
SUT VIC AB 3-0 SH 27X BRD (SUTURE) ×4 IMPLANT
SUT VICRYL+ 3-0 36IN CT-1 (SUTURE) ×4 IMPLANT
SYR 20CC LL (SYRINGE) ×4 IMPLANT
SYR 3ML LL SCALE MARK (SYRINGE) ×4 IMPLANT
SYR TB 1ML 27GX1/2 LL (SYRINGE) IMPLANT
TOWEL OR 17X26 4PK STRL BLUE (TOWEL DISPOSABLE) IMPLANT
TROCAR XCEL NON-BLD 11X100MML (ENDOMECHANICALS) ×4 IMPLANT
TUBING INSUFFLATOR HI FLOW (MISCELLANEOUS) ×4 IMPLANT

## 2016-05-30 NOTE — Op Note (Signed)
  OPERATIVE NOTE   PROCEDURE: 1. Laparoscopic peritoneal dialysis catheter placement.  PRE-OPERATIVE DIAGNOSIS: 1. ESRD   POST-OPERATIVE DIAGNOSIS: Same  SURGEON: Leotis Pain, MD  ASSISTANT(S): Arvilla Meres, RNFA-S  ANESTHESIA: general  ESTIMATED BLOOD LOSS: Minimal   FINDING(S): 1. None  SPECIMEN(S): None  INDICATIONS:  Patient presents with ESRD. The patient is considering peritoneal dialysis for his long-term dialysis. We are also placing an AVF as he is unsure which he will use long term. Risks and benefits of placement were discussed and he is agreeable to proceed.  Differences between peritoneal dialysis and hemodialysis were discussed.    DESCRIPTION: After obtaining full informed written consent, the patient was brought back to the operating room and placed supine upon the operating table. The patient received IV antibiotics prior to induction. After obtaining adequate anesthesia, the abdomen was prepped and draped in the standard fashion. A small transverse incision was created just to the left of the umbilicus and we dissected down to the fascia and placed a pursestring Vicryl suture. I then entered the peritoneum with an 73mm Optiview trocar placed in the right upper quadrant and insufflated the abdomen with carbon dioxide. I then entered the peritoneum just beside the umbilicus with a trocar and the peritoneal dialysis catheter under direct visualization. The coiled portion of the catheter was parked into the pelvis under direct laparoscopic guidance. The deep cuff was secured to the fascial pursestring suture. A small counterincision was made in the left abdomen and the catheter was brought out this site. The appropriate distal connectors were placed, and I then placed 500 cc of saline through the catheter into the pelvis. The abdomen was desufflated. Immediately, over 400 cc of effluent returned through the catheter when the bag was placed to gravity. I took one more  look with the camera to ensure that the catheter was in the pelvis and it was. The 27mm trocar was then removed. I then closed the incisions with 3-0 Vicryl and 4-0 Monocryl and placed Dermabond as dressing. Dry dressing was placed around the catheter exit site. The patient was then awakened from anesthesia and taken to the recovery room in stable condition having tolerated the procedure well.  COMPLICATIONS: None  CONDITION: None  Leotis Pain, MD 05/30/2016 2:03 PM   This note was created with Dragon Medical transcription system. Any errors in dictation are purely unintentional.

## 2016-05-30 NOTE — Anesthesia Procedure Notes (Signed)
Anesthesia Regional Block: Supraclavicular block   Pre-Anesthetic Checklist: ,, timeout performed, Correct Patient, Correct Site, Correct Laterality, Correct Procedure, Correct Position, site marked, Risks and benefits discussed,  Surgical consent,  Pre-op evaluation,  At surgeon's request and post-op pain management  Laterality: Upper and Left  Prep: chloraprep       Needles:  Injection technique: Single-shot  Needle Type: Stimiplex     Needle Length: 5cm  Needle Gauge: 22     Additional Needles:   Procedures: ultrasound guided,,,,,,,,  Narrative:  Start time: 05/30/2016 11:37 AM End time: 05/30/2016 11:40 AM Injection made incrementally with aspirations every 5 mL.  Performed by: Personally  Anesthesiologist: Katy Fitch K  Additional Notes: Functioning IV was confirmed and monitors were applied.  A 70mm 22ga Stimuplex needle was used. Sterile prep,hand hygiene and sterile gloves were used.  Negative aspiration and negative test dose prior to incremental administration of local anesthetic. The patient tolerated the procedure well with no immediate complications.

## 2016-05-30 NOTE — Discharge Instructions (Signed)

## 2016-05-30 NOTE — Transfer of Care (Signed)
Immediate Anesthesia Transfer of Care Note  Patient: [name]  Procedure(s) Performed: Procedure(s): ARTERIOVENOUS (AV) FISTULA CREATION ( BRACHIOCEPHALIC ) (Left) LAPAROSCOPIC INSERTION CONTINUOUS AMBULATORY PERITONEAL DIALYSIS  (CAPD) CATHETER (N/A)  Patient Location: PACU  Anesthesia Type:General  Level of Consciousness: sedated and responds to stimulation  Airway & Oxygen Therapy: Patient Spontanous Breathing and Patient connected to nasal cannula oxygen  Post-op Assessment: Report given to RN and Post -op Vital signs reviewed and stable  Post vital signs: Reviewed and stable  Last Vitals:  Vitals:   05/30/16 1200 05/30/16 1403  BP: (!) 145/59 (!) 157/60  Pulse: 64 76  Resp:  (!) 21  Temp:      Last Pain: There were no vitals filed for this visit.       Complications: No apparent anesthesia complications

## 2016-05-30 NOTE — Anesthesia Postprocedure Evaluation (Signed)
Anesthesia Post Note  Patient: [name]  Procedure(s) Performed: Procedure(s) (LRB): ARTERIOVENOUS (AV) FISTULA CREATION ( BRACHIOCEPHALIC ) (Left) LAPAROSCOPIC INSERTION CONTINUOUS AMBULATORY PERITONEAL DIALYSIS  (CAPD) CATHETER (N/A)  Patient location during evaluation: PACU Anesthesia Type: General Level of consciousness: awake and alert Pain management: pain level controlled Vital Signs Assessment: post-procedure vital signs reviewed and stable Respiratory status: spontaneous breathing, nonlabored ventilation, respiratory function stable and patient connected to nasal cannula oxygen Cardiovascular status: blood pressure returned to baseline and stable Postop Assessment: no signs of nausea or vomiting Anesthetic complications: no     Last Vitals:  Vitals:   05/30/16 1455 05/30/16 1500  BP: (!) 158/49   Pulse: 62 63  Resp: 14 17  Temp:      Last Pain:  Vitals:   05/30/16 1450  PainSc: 3                  Loida Calamia K Brigido Mera

## 2016-05-30 NOTE — Anesthesia Procedure Notes (Signed)
Procedure Name: Intubation Performed by: Lance Muss Pre-anesthesia Checklist: Patient identified, Patient being monitored, Timeout performed, Emergency Drugs available and Suction available Patient Re-evaluated:Patient Re-evaluated prior to inductionOxygen Delivery Method: Circle system utilized Preoxygenation: Pre-oxygenation with 100% oxygen Intubation Type: IV induction Ventilation: Mask ventilation without difficulty Laryngoscope Size: Glidescope and 4 Grade View: Grade IV Tube type: Oral Tube size: 7.5 mm Number of attempts: 2 Airway Equipment and Method: Stylet Placement Confirmation: ETT inserted through vocal cords under direct vision,  positive ETCO2 and breath sounds checked- equal and bilateral Secured at: 22 cm Tube secured with: Tape Dental Injury: Teeth and Oropharynx as per pre-operative assessment  Difficulty Due To: Difficult Airway- due to anterior larynx Future Recommendations: Recommend- induction with short-acting agent, and alternative techniques readily available Comments: First attempt with #3 MAC, grade 4 view, anterior airway. Second attempt with Glidescope #4, grade 2 view. Recommend using video-laryngoscopy.

## 2016-05-30 NOTE — Anesthesia Post-op Follow-up Note (Cosign Needed)
Anesthesia QCDR form completed.        

## 2016-05-30 NOTE — Op Note (Signed)
[facility]   OPERATIVE NOTE   PROCEDURE: Left brachiocephalic arteriovenous fistula placement  PRE-OPERATIVE DIAGNOSIS: 1.  ESRD        POST-OPERATIVE DIAGNOSIS: 1. ESRD       SURGEON: Leotis Pain, MD  ASSISTANT(S): Arvilla Meres, RNFA-S  ANESTHESIA: general  ESTIMATED BLOOD LOSS: 10 cc  FINDING(S): Adequate cephalic vein for fistula creation  SPECIMEN(S):  none  INDICATIONS:   Calvin Good is a 76 y.o. male who presents with renal failure in need of pemanent dialysis acces.  The patient is scheduled for left arm AVF placement. We are also placing a PD catheter as he is not sure what type of dialysis he will do long term. The patient is aware the risks include but are not limited to: bleeding, infection, steal syndrome, nerve damage, ischemic monomelic neuropathy, failure to mature, and need for additional procedures.  The patient is aware of the risks of the procedure and elects to proceed forward.  DESCRIPTION: After full informed written consent was obtained from the patient, the patient was brought back to the operating room and placed supine upon the operating table.  Prior to induction, the patient received IV antibiotics.   After obtaining adequate anesthesia, the patient was then prepped and draped in the standard fashion for a left arm access procedure.  I made a curvilinear incision at the level of the antecubital fossa and dissected through the subcutaneous tissue and fascia to gain exposure of the brachial artery.  This was noted to be patent and adequate in size for fistula creation.  This was dissected out proximally and distally and prepared for control with vessel loops .  I then dissected out the cephalic vein.  This was noted to be patent and adequate in size for fistula creation.  I then gave the patient 3000 units of intravenous heparin.  The vein was marked for orientation and the distal segment of the vein was ligated with a  2-0 silk, and  the vein was transected.  I then instilled the heparinized saline into the vein and clamped it.  At this point, I reset my exposure of the brachial artery and pulled up control on the vessel loops.  I made an arteriotomy with a #11 blade, and then I extended the arteriotomy with a Potts scissor.  I injected heparinized saline proximal and distal to this arteriotomy.  The vein was then sewn to the artery in an end-to-side configuration with a running stitch of 6-0 Prolene.  Prior to completing this anastomosis, I allowed the vein and artery to backbleed.  There was no evidence of clot from any vessels.  I completed the anastomosis in the usual fashion and then released all vessel loops and clamps.  There was a palpable  thrill in the venous outflow, and there was a palpable pulse in the artery distal to the anastomosis.  At this point, I irrigated out the surgical wound.  Surgicel was placed. There was no further active bleeding.  The subcutaneous tissue was reapproximated with a running stitch of 3-0 Vicryl.  The skin was then closed with a 4-0 Monocryl suture.  The skin was then cleaned, dried, and reinforced with Dermabond.  The patient tolerated this procedure well and was taken to the recovery room in stable condition  COMPLICATIONS: None  CONDITION: Stable   Leotis Pain    05/30/2016, 2:00 PM  This note was created with Dragon Medical transcription system. Any errors in dictation are purely unintentional.

## 2016-05-30 NOTE — Anesthesia Preprocedure Evaluation (Signed)
Anesthesia Evaluation  Patient identified by MRN, date of birth, ID band Patient awake    Reviewed: Allergy & Precautions, H&P , NPO status , Patient's Chart, lab work & pertinent test results  History of Anesthesia Complications Negative for: history of anesthetic complications  Airway Mallampati: III  TM Distance: >3 FB Neck ROM: limited    Dental  (+) Poor Dentition, Chipped, Caps   Pulmonary shortness of breath and with exertion, COPD, former smoker,    Pulmonary exam normal breath sounds clear to auscultation       Cardiovascular Exercise Tolerance: Good hypertension, (-) angina+CHF  (-) Past MI Normal cardiovascular exam Rhythm:regular Rate:Normal     Neuro/Psych negative neurological ROS  negative psych ROS   GI/Hepatic Neg liver ROS, GERD  Controlled and Medicated,  Endo/Other  diabetes, Type 2  Renal/GU DialysisRenal disease     Musculoskeletal   Abdominal   Peds  Hematology negative hematology ROS (+)   Anesthesia Other Findings Past Medical History: No date: Cancer ([place])     Comment: Prostate No date: CHF (congestive heart failure) (HCC) No date: Chronic kidney disease No date: COPD (chronic obstructive pulmonary disease) (* No date: Diabetes mellitus without complication (HCC) No date: Dyspnea No date: GERD (gastroesophageal reflux disease) No date: Hyperlipidemia No date: Hypertension  Past Surgical History: No date: APPENDECTOMY No date: DIALYSIS/PERMA CATHETER INSERTION No date: JOINT REPLACEMENT     Comment: right knee  No date: postate removal  No date: PROSTATE SURGERY  BMI    Body Mass Index:  35.28 kg/m      Reproductive/Obstetrics negative OB ROS                             Anesthesia Physical Anesthesia Plan  ASA: IV  Anesthesia Plan: General ETT   Post-op Pain Management: GA combined w/ Regional for post-op pain   Induction:   Airway  Management Planned:   Additional Equipment:   Intra-op Plan:   Post-operative Plan:   Informed Consent: I have reviewed the patients History and Physical, chart, labs and discussed the procedure including the risks, benefits and alternatives for the proposed anesthesia with the patient or authorized representative who has indicated his/her understanding and acceptance.   Dental Advisory Given  Plan Discussed with: Anesthesiologist, CRNA and Surgeon  Anesthesia Plan Comments:         Anesthesia Quick Evaluation

## 2016-05-30 NOTE — H&P (Signed)
[facility] History & Physical Update  The patient was interviewed and re-examined.  The patient's previous History and Physical has been reviewed and is unchanged.  There is no change in the plan of care. We plan to proceed with the scheduled procedure.  Leotis Pain, MD  05/30/2016, 11:06 AM

## 2016-05-31 ENCOUNTER — Encounter: Payer: Self-pay | Admitting: Vascular Surgery

## 2016-06-11 ENCOUNTER — Telehealth: Payer: Self-pay | Admitting: *Deleted

## 2016-06-11 NOTE — Telephone Encounter (Signed)
Pt would like to know the status of the Diabetic Shoes he is to get from Medicare.

## 2016-06-12 ENCOUNTER — Encounter (INDEPENDENT_AMBULATORY_CARE_PROVIDER_SITE_OTHER): Payer: Self-pay

## 2016-06-12 ENCOUNTER — Telehealth (INDEPENDENT_AMBULATORY_CARE_PROVIDER_SITE_OTHER): Payer: Self-pay

## 2016-06-12 NOTE — Telephone Encounter (Signed)
Daughter called and stated that her Dad's arm is a little red and swollen form the fistula creation on 05/30/16/, and she wants to know if this is normal or regular? I spoke to [place] and she stated that the redness and swelling are normal.

## 2016-06-20 ENCOUNTER — Telehealth: Payer: Self-pay | Admitting: Podiatry

## 2016-06-20 NOTE — Telephone Encounter (Signed)
Patients daughter called the office said the patient was seen here on 05/13/16 by Dr. Prudence Davidson for diabetic foot care. Patient requested new diabetic shoes with Dr. Prudence Davidson and wanted to know the status of his paperwork being sent / started with his PCP Frazier Richards.   Daughter [name] would like a call back to advise her if the paperwork was ever sent out to the PCP and if there is anything that she needs to do to expedite this.  Its been over a month and the patient thought we had already ordered his shoes.  Daughter can be reached at [phone]. Thanks!

## 2016-06-20 NOTE — Telephone Encounter (Signed)
Thank you. I will follow up.

## 2016-06-21 NOTE — Telephone Encounter (Signed)
Per [employer], forms were faxed to Dr. Ouida Sills yesterday.  I contacted patient and informed him that we are now waiting on the doctor to sign and send back to [employer].

## 2016-06-25 ENCOUNTER — Encounter (INDEPENDENT_AMBULATORY_CARE_PROVIDER_SITE_OTHER): Payer: Self-pay | Admitting: Vascular Surgery

## 2016-06-25 ENCOUNTER — Encounter (INDEPENDENT_AMBULATORY_CARE_PROVIDER_SITE_OTHER): Payer: Self-pay

## 2016-06-25 ENCOUNTER — Ambulatory Visit (INDEPENDENT_AMBULATORY_CARE_PROVIDER_SITE_OTHER): Payer: Medicare Other | Admitting: Vascular Surgery

## 2016-06-25 VITALS — BP 158/56 | HR 79 | Resp 17 | Wt 232.0 lb

## 2016-06-25 DIAGNOSIS — Z992 Dependence on renal dialysis: Secondary | ICD-10-CM

## 2016-06-25 DIAGNOSIS — E785 Hyperlipidemia, unspecified: Secondary | ICD-10-CM

## 2016-06-25 DIAGNOSIS — E1122 Type 2 diabetes mellitus with diabetic chronic kidney disease: Secondary | ICD-10-CM

## 2016-06-25 DIAGNOSIS — N186 End stage renal disease: Secondary | ICD-10-CM

## 2016-06-25 NOTE — Assessment & Plan Note (Signed)
blood glucose control important in reducing the progression of atherosclerotic disease. Also, involved in wound healing. On appropriate medications.  

## 2016-06-25 NOTE — Assessment & Plan Note (Signed)
His AV fistula is healing well and should have the ability to be used for dialysis in about 2 weeks. He can start using his peritoneal dialysis catheter as well. Once we know we have an alternative permanent access that is working, his PermCath to be removed. I will see him back in 3-4 months in the office.

## 2016-06-25 NOTE — Progress Notes (Signed)
Patient ID: Calvin Good, male   DOB: 12-02-1940, 76 y.o.   MRN: [id number]  Chief Complaint  Patient presents with  . Wound Check    HPI Calvin Good is a 76 y.o. male.  Patient returns in follow-up 4 weeks after left brachiocephalic AV fistula creation and peritoneal dialysis catheter. His incisions are all well-healed. His fistula has a nice thrill. He starts training for PD next week. He is still using his PermCath currently.   Past Medical History:  Diagnosis Date  . Cancer [facility])    Prostate  . CHF (congestive heart failure) ([place])   . Chronic kidney disease   . COPD (chronic obstructive pulmonary disease) ([place])   . Diabetes mellitus without complication ([place])   . Difficult intubation   . Dyspnea   . GERD (gastroesophageal reflux disease)   . Hyperlipidemia   . Hypertension     Past Surgical History:  Procedure Laterality Date  . APPENDECTOMY    . AV FISTULA PLACEMENT Left 05/30/2016   Procedure: ARTERIOVENOUS (AV) FISTULA CREATION ( BRACHIOCEPHALIC );  Surgeon: [name], MD;  Location: [facility] ORS;  Service: Vascular;  Laterality: Left;  . CAPD INSERTION N/A 05/30/2016   Procedure: LAPAROSCOPIC INSERTION CONTINUOUS AMBULATORY PERITONEAL DIALYSIS  (CAPD) CATHETER;  Surgeon: [name], MD;  Location: [facility] ORS;  Service: Vascular;  Laterality: N/A;  . DIALYSIS/PERMA CATHETER INSERTION    . JOINT REPLACEMENT     right knee   . postate removal     . PROSTATE SURGERY        Allergies  Allergen Reactions  . Tetracyclines & Related Other (See Comments)    unknown  . Morphine Other (See Comments)    confusion    Current Outpatient Prescriptions  Medication Sig Dispense Refill  . acetaminophen (TYLENOL) 650 MG CR tablet Take 1,300 mg by mouth every 8 (eight) hours as needed for pain.    Marland Kitchen albuterol (PROVENTIL HFA;VENTOLIN HFA) 108 (90 Base) MCG/ACT inhaler Inhale 2 puffs into the lungs every 6 (six) hours as needed for wheezing or shortness of breath.    Marland Kitchen  amLODipine (NORVASC) 10 MG tablet Take 10 mg by mouth every evening.     . Ascorbic Acid (VITAMIN C ADULT GUMMIES PO) Take 3 Doses by mouth daily.    Marland Kitchen aspirin 81 MG chewable tablet Chew 81 mg by mouth daily.     Marland Kitchen atorvastatin (LIPITOR) 20 MG tablet Take 20 mg by mouth daily.    . calcium acetate (PHOSLO) 667 MG capsule Take 1,334 mg by mouth 3 (three) times daily with meals.     . carvedilol (COREG) 25 MG tablet Take 12.5 mg by mouth 2 (two) times daily with a meal.     . doxazosin (CARDURA) 8 MG tablet Take 8 mg by mouth daily.    . furosemide (LASIX) 40 MG tablet Take 40 mg by mouth 2 (two) times daily.    Marland Kitchen HYDROcodone-acetaminophen (NORCO) 5-325 MG tablet Take 1 tablet by mouth every 6 (six) hours as needed for moderate pain. 30 tablet 0  . indomethacin (INDOCIN) 50 MG capsule Take 50 mg by mouth 2 (two) times daily as needed (gout).    . insulin lispro (HUMALOG) 100 UNIT/ML injection Inject 10 Units into the skin 3 (three) times daily before meals.    . insulin NPH Human (HUMULIN N,NOVOLIN N) 100 UNIT/ML injection Inject 60-65 Units into the skin 2 (two) times daily before a meal. 65 units in the morning  and 60 units in the evening    . lisinopril (PRINIVIL,ZESTRIL) 10 MG tablet Take 10 mg by mouth every evening.    . Melatonin 3 MG CAPS Take 1 capsule by mouth daily.    . Multiple Vitamins-Minerals (ADULT GUMMY PO) Take 2 tablets by mouth daily.    Marland Kitchen omeprazole (PRILOSEC) 20 MG capsule Take 20 mg by mouth 2 (two) times daily before a meal.    . VITAMIN D-VITAMIN K PO Take 1 drop by mouth daily. Vit D3 and Vit K liquid 25 mcg of Vit K2 1208 units of D3     Current Facility-Administered Medications  Medication Dose Route Frequency Provider Last Rate Last Dose  . ceFAZolin (ANCEF) IVPB 1 g/50 mL premix  1 g Intravenous Once [name], MD            Physical Exam BP (!) 158/56   Pulse 79   Resp 17   Wt 232 lb (105.2 kg)   BMI 35.28 kg/m  Gen:  WD/WN, NAD Skin: incision  C/D/I. AV fistula with good thrill present. PD catheter site clean, dry, and intact     Assessment/Plan:  Hyperlipemia lipid control important in reducing the progression of atherosclerotic disease. Continue statin therapy   Diabetes ([place]) blood glucose control important in reducing the progression of atherosclerotic disease. Also, involved in wound healing. On appropriate medications.   ESRD on dialysis [facility]) His AV fistula is healing well and should have the ability to be used for dialysis in about 2 weeks. He can start using his peritoneal dialysis catheter as well. Once we know we have an alternative permanent access that is working, his PermCath to be removed. I will see him back in 3-4 months in the office.      Leotis Pain 06/25/2016, 3:15 PM   This note was created with Dragon medical transcription system.  Any errors from dictation are unintentional.

## 2016-06-25 NOTE — Assessment & Plan Note (Signed)
lipid control important in reducing the progression of atherosclerotic disease. Continue statin therapy  

## 2016-07-22 ENCOUNTER — Telehealth (INDEPENDENT_AMBULATORY_CARE_PROVIDER_SITE_OTHER): Payer: Self-pay

## 2016-07-26 ENCOUNTER — Encounter (INDEPENDENT_AMBULATORY_CARE_PROVIDER_SITE_OTHER): Payer: Self-pay

## 2016-07-29 ENCOUNTER — Other Ambulatory Visit (INDEPENDENT_AMBULATORY_CARE_PROVIDER_SITE_OTHER): Payer: Self-pay | Admitting: Vascular Surgery

## 2016-08-01 ENCOUNTER — Encounter
Admission: RE | Admit: 2016-08-01 | Discharge: 2016-08-01 | Disposition: A | Discharge status: Home / Self Care | Payer: Medicare Other | Source: Ambulatory Visit | Attending: Vascular Surgery | Admitting: Vascular Surgery

## 2016-08-01 DIAGNOSIS — Z01810 Encounter for preprocedural cardiovascular examination: Secondary | ICD-10-CM | POA: Diagnosis present

## 2016-08-01 DIAGNOSIS — I252 Old myocardial infarction: Secondary | ICD-10-CM | POA: Insufficient documentation

## 2016-08-01 DIAGNOSIS — N186 End stage renal disease: Secondary | ICD-10-CM | POA: Diagnosis not present

## 2016-08-01 DIAGNOSIS — I4510 Unspecified right bundle-branch block: Secondary | ICD-10-CM | POA: Diagnosis not present

## 2016-08-01 DIAGNOSIS — Z01812 Encounter for preprocedural laboratory examination: Secondary | ICD-10-CM | POA: Insufficient documentation

## 2016-08-01 HISTORY — DX: Adverse effect of unspecified anesthetic, initial encounter: T41.45XA

## 2016-08-01 HISTORY — DX: Other complications of anesthesia, initial encounter: T88.59XA

## 2016-08-01 LAB — TYPE AND SCREEN
ABO/RH(D): O POS
Antibody Screen: NEGATIVE

## 2016-08-01 LAB — APTT: APTT: 30 s (ref 24–36)

## 2016-08-01 LAB — CBC WITH DIFFERENTIAL/PLATELET
BASOS ABS: 0 10*3/uL (ref 0–0.1)
BASOS PCT: 0 %
EOS ABS: 0.4 10*3/uL (ref 0–0.7)
EOS PCT: 5 %
HCT: 33.7 % — ABNORMAL LOW (ref 40.0–52.0)
Hemoglobin: 11.8 g/dL — ABNORMAL LOW (ref 13.0–18.0)
Lymphocytes Relative: 23 %
Lymphs Abs: 2 10*3/uL (ref 1.0–3.6)
MCH: 30.8 pg (ref 26.0–34.0)
MCHC: 35.1 g/dL (ref 32.0–36.0)
MCV: 87.9 fL (ref 80.0–100.0)
MONO ABS: 0.8 10*3/uL (ref 0.2–1.0)
Monocytes Relative: 9 %
NEUTROS ABS: 5.6 10*3/uL (ref 1.4–6.5)
Neutrophils Relative %: 63 %
PLATELETS: 177 10*3/uL (ref 150–440)
RBC: 3.83 MIL/uL — ABNORMAL LOW (ref 4.40–5.90)
RDW: 14.5 % (ref 11.5–14.5)
WBC: 8.9 10*3/uL (ref 3.8–10.6)

## 2016-08-01 LAB — BASIC METABOLIC PANEL
ANION GAP: 10 (ref 5–15)
BUN: 41 mg/dL — AB (ref 6–20)
CALCIUM: 8.8 mg/dL — AB (ref 8.9–10.3)
CO2: 27 mmol/L (ref 22–32)
Chloride: 98 mmol/L — ABNORMAL LOW (ref 101–111)
Creatinine, Ser: 4.4 mg/dL — ABNORMAL HIGH (ref 0.61–1.24)
GFR calc Af Amer: 14 mL/min — ABNORMAL LOW (ref >60)
GFR, EST NON AFRICAN AMERICAN: 12 mL/min — AB (ref >60)
GLUCOSE: 101 mg/dL — AB (ref 65–99)
Potassium: 3.3 mmol/L — ABNORMAL LOW (ref 3.5–5.1)
Sodium: 135 mmol/L (ref 135–145)

## 2016-08-01 LAB — SURGICAL PCR SCREEN

## 2016-08-01 LAB — PROTIME-INR
INR: 1
PROTHROMBIN TIME: 13.2 s (ref 11.4–15.2)

## 2016-08-01 NOTE — Patient Instructions (Signed)
Your procedure is scheduled on: Thursday 08/08/16 Report to [facility]. 2ND FLOOR MEDICAL MALL ENTRANCE. To find out your arrival time please call [phone] between 1PM - 3PM on Wednesday 08/07/16.  Remember: Instructions that are not followed completely may result in serious medical risk, up to and including death, or upon the discretion of your surgeon and anesthesiologist your surgery may need to be rescheduled.    __X__ 1. Do not eat food or drink liquids after midnight. No gum chewing or hard candies.     __X__ 2. No Alcohol for 24 hours before or after surgery.   ____ 3. Bring all medications with you on the day of surgery if instructed.    __X__ 4. Notify your doctor if there is any change in your medical condition     (cold, fever, infections).             ___X__5. No smoking within 24 hours of your surgery.     Do not wear jewelry, make-up, hairpins, clips or nail polish.  Do not wear lotions, powders, or perfumes.   Do not shave 48 hours prior to surgery. Men may shave face and neck.  Do not bring valuables to the hospital.    [facility] is not responsible for any belongings or valuables.               Contacts, dentures or bridgework may not be worn into surgery.  Leave your suitcase in the car. After surgery it may be brought to your room.  For patients admitted to the hospital, discharge time is determined by your                treatment team.   Patients discharged the day of surgery will not be allowed to drive home.   Please read over the following fact sheets that you were given:   MRSA Information   __X__ Take these medicines the morning of surgery with A SIP OF WATER:    1. ATORVASTATIN  2. CARVEDILOL  3. DOXAZOSIN  4. OMEPRAZOLE  5.  6.  ____ Fleet Enema (as directed)   __X__ Use CHG Soap as directed  __X__ Use inhalers on the day of surgery  ____ Stop metformin 2 days prior to surgery    __X__ Take 1/2 of usual insulin dose the night before  surgery and none on the morning of surgery.   __X__ Stop Coumadin/Plavix/aspirin on CAN CONTINUE ASPIRIN BUT DO NOT TAKE THE DAY OF SURGERY  __X__ Stop Anti-inflammatories such as Advil, Aleve, Ibuprofen, Motrin, Naproxen, Naprosyn, Goodies,powder, or aspirin products.  OK to take Tylenol.   __X__ Stop supplements until after surgery.  STOP 7 DAYS BEFORE SURGERY  ____ Bring C-Pap to the hospital.

## 2016-08-01 NOTE — Pre-Procedure Instructions (Signed)
EKG VIEWED BY DR Randa Lynn AND SAME AS 2/18

## 2016-08-02 NOTE — Pre-Procedure Instructions (Signed)
Faxed request for new H&P (outdated).

## 2016-08-05 NOTE — Pre-Procedure Instructions (Signed)
EKG TO DR Ouida Sills AFTER REVIEW BY DR Marcello Moores. SPOKE WITH ASHLEY. SPOKE WITH April AT DR DEW'S

## 2016-08-05 NOTE — Pre-Procedure Instructions (Signed)
SPOKE WITH PATTI ,NURSE AT DR ANDERSON'S.STATES IF NEEDS TO BE CLEARED FOR SURGERY AND HAS NOT BEEN SEEN WITHIN 2 WEEKS WILL NEED TO BE SEEN IN OFFICE. SHE IS TO CONTACT PATIENT. DR Marcello Moores NOTIFED AND STILL REQUESTS PATENT BE OPTIMIZED FOR SURGERY NOTING PREOP EKG

## 2016-08-06 ENCOUNTER — Encounter
Admission: RE | Admit: 2016-08-06 | Discharge: 2016-08-06 | Disposition: A | Discharge status: Home / Self Care | Payer: Medicare Other | Source: Ambulatory Visit | Attending: Vascular Surgery | Admitting: Vascular Surgery

## 2016-08-06 DIAGNOSIS — Z8546 Personal history of malignant neoplasm of prostate: Secondary | ICD-10-CM | POA: Diagnosis not present

## 2016-08-06 DIAGNOSIS — Z87891 Personal history of nicotine dependence: Secondary | ICD-10-CM | POA: Diagnosis not present

## 2016-08-06 DIAGNOSIS — Z794 Long term (current) use of insulin: Secondary | ICD-10-CM | POA: Diagnosis not present

## 2016-08-06 DIAGNOSIS — Z992 Dependence on renal dialysis: Secondary | ICD-10-CM | POA: Diagnosis not present

## 2016-08-06 DIAGNOSIS — J449 Chronic obstructive pulmonary disease, unspecified: Secondary | ICD-10-CM | POA: Diagnosis not present

## 2016-08-06 DIAGNOSIS — Z7982 Long term (current) use of aspirin: Secondary | ICD-10-CM | POA: Diagnosis not present

## 2016-08-06 DIAGNOSIS — Z4902 Encounter for fitting and adjustment of peritoneal dialysis catheter: Secondary | ICD-10-CM | POA: Diagnosis present

## 2016-08-06 DIAGNOSIS — E1122 Type 2 diabetes mellitus with diabetic chronic kidney disease: Secondary | ICD-10-CM | POA: Diagnosis not present

## 2016-08-06 DIAGNOSIS — E785 Hyperlipidemia, unspecified: Secondary | ICD-10-CM | POA: Diagnosis not present

## 2016-08-06 DIAGNOSIS — I5032 Chronic diastolic (congestive) heart failure: Secondary | ICD-10-CM | POA: Diagnosis not present

## 2016-08-06 DIAGNOSIS — I132 Hypertensive heart and chronic kidney disease with heart failure and with stage 5 chronic kidney disease, or end stage renal disease: Secondary | ICD-10-CM | POA: Diagnosis not present

## 2016-08-06 DIAGNOSIS — N186 End stage renal disease: Secondary | ICD-10-CM | POA: Diagnosis not present

## 2016-08-06 DIAGNOSIS — Z79899 Other long term (current) drug therapy: Secondary | ICD-10-CM | POA: Diagnosis not present

## 2016-08-06 LAB — SURGICAL PCR SCREEN
MRSA, PCR: NEGATIVE
Staphylococcus aureus: NEGATIVE

## 2016-08-07 NOTE — Pre-Procedure Instructions (Addendum)
PATTI FROM DR Ouida Sills CALLED AND PATIENT SEEING ROB TUMEY PA IN AM AT 1000 BEFORE  SURGERY. LAURA AT DR DEW'S NOTIFIED

## 2016-08-08 ENCOUNTER — Ambulatory Visit: Payer: Medicare Other | Admitting: Podiatry

## 2016-08-08 ENCOUNTER — Encounter
Admission: RE | Disposition: A | Discharge status: Home / Self Care | Payer: Self-pay | Source: Ambulatory Visit | Attending: Vascular Surgery

## 2016-08-08 ENCOUNTER — Ambulatory Visit
Admission: RE | Admit: 2016-08-08 | Discharge: 2016-08-08 | Disposition: A | Discharge status: Home / Self Care | Payer: Medicare Other | Source: Ambulatory Visit | Attending: Vascular Surgery | Admitting: Vascular Surgery

## 2016-08-08 ENCOUNTER — Ambulatory Visit: Payer: Medicare Other | Admitting: Anesthesiology

## 2016-08-08 ENCOUNTER — Ambulatory Visit: Payer: Medicare Other | Admitting: Certified Registered"

## 2016-08-08 DIAGNOSIS — Z87891 Personal history of nicotine dependence: Secondary | ICD-10-CM | POA: Insufficient documentation

## 2016-08-08 DIAGNOSIS — Z8546 Personal history of malignant neoplasm of prostate: Secondary | ICD-10-CM | POA: Insufficient documentation

## 2016-08-08 DIAGNOSIS — I132 Hypertensive heart and chronic kidney disease with heart failure and with stage 5 chronic kidney disease, or end stage renal disease: Secondary | ICD-10-CM | POA: Insufficient documentation

## 2016-08-08 DIAGNOSIS — I5032 Chronic diastolic (congestive) heart failure: Secondary | ICD-10-CM | POA: Insufficient documentation

## 2016-08-08 DIAGNOSIS — E1122 Type 2 diabetes mellitus with diabetic chronic kidney disease: Secondary | ICD-10-CM | POA: Insufficient documentation

## 2016-08-08 DIAGNOSIS — Z794 Long term (current) use of insulin: Secondary | ICD-10-CM | POA: Insufficient documentation

## 2016-08-08 DIAGNOSIS — Z79899 Other long term (current) drug therapy: Secondary | ICD-10-CM | POA: Insufficient documentation

## 2016-08-08 DIAGNOSIS — J449 Chronic obstructive pulmonary disease, unspecified: Secondary | ICD-10-CM | POA: Insufficient documentation

## 2016-08-08 DIAGNOSIS — Z992 Dependence on renal dialysis: Secondary | ICD-10-CM | POA: Insufficient documentation

## 2016-08-08 DIAGNOSIS — N186 End stage renal disease: Secondary | ICD-10-CM | POA: Insufficient documentation

## 2016-08-08 DIAGNOSIS — Z4902 Encounter for fitting and adjustment of peritoneal dialysis catheter: Secondary | ICD-10-CM | POA: Diagnosis not present

## 2016-08-08 DIAGNOSIS — Z7982 Long term (current) use of aspirin: Secondary | ICD-10-CM | POA: Insufficient documentation

## 2016-08-08 DIAGNOSIS — E785 Hyperlipidemia, unspecified: Secondary | ICD-10-CM | POA: Insufficient documentation

## 2016-08-08 HISTORY — PX: REMOVAL OF A DIALYSIS CATHETER: SHX6053

## 2016-08-08 LAB — POCT I-STAT 4, (NA,K, GLUC, HGB,HCT)
Glucose, Bld: 201 mg/dL — ABNORMAL HIGH (ref 65–99)
HEMATOCRIT: 34 % — AB (ref 39.0–52.0)
Hemoglobin: 11.6 g/dL — ABNORMAL LOW (ref 13.0–17.0)
Potassium: 3.8 mmol/L (ref 3.5–5.1)
Sodium: 138 mmol/L (ref 135–145)

## 2016-08-08 LAB — GLUCOSE, CAPILLARY: GLUCOSE-CAPILLARY: 214 mg/dL — AB (ref 65–99)

## 2016-08-08 SURGERY — REMOVAL, DIALYSIS CATHETER
Anesthesia: Monitor Anesthesia Care

## 2016-08-08 MED ORDER — FENTANYL CITRATE (PF) 100 MCG/2ML IJ SOLN
INTRAMUSCULAR | Status: AC
  Filled 2016-08-08: qty 2

## 2016-08-08 MED ORDER — PROPOFOL 10 MG/ML IV BOLUS
INTRAVENOUS | Status: DC | PRN
  Administered 2016-08-08: 30 mg via INTRAVENOUS

## 2016-08-08 MED ORDER — CHLORHEXIDINE GLUCONATE CLOTH 2 % EX PADS: 6.0000 | MEDICATED_PAD | Freq: Once | CUTANEOUS | Status: DC

## 2016-08-08 MED ORDER — BUPIVACAINE HCL (PF) 0.5 % IJ SOLN
INTRAMUSCULAR | Status: AC
  Filled 2016-08-08: qty 30

## 2016-08-08 MED ORDER — BUPIVACAINE HCL (PF) 0.5 % IJ SOLN
INTRAMUSCULAR | Status: DC | PRN
  Administered 2016-08-08: 25 mL

## 2016-08-08 MED ORDER — BACITRACIN ZINC 500 UNIT/GM EX OINT
TOPICAL_OINTMENT | CUTANEOUS | Status: DC | PRN
  Administered 2016-08-08: 1 via TOPICAL

## 2016-08-08 MED ORDER — CEFAZOLIN SODIUM-DEXTROSE 2-4 GM/100ML-% IV SOLN
INTRAVENOUS | Status: AC
  Filled 2016-08-08: qty 100

## 2016-08-08 MED ORDER — SODIUM CHLORIDE 0.9 % IV SOLN
INTRAVENOUS | Status: DC
  Administered 2016-08-08: 12:00:00 via INTRAVENOUS

## 2016-08-08 MED ORDER — BACITRACIN ZINC 500 UNIT/GM EX OINT
TOPICAL_OINTMENT | CUTANEOUS | Status: AC
  Filled 2016-08-08: qty 0.9

## 2016-08-08 MED ORDER — LIDOCAINE HCL (CARDIAC) 20 MG/ML IV SOLN
INTRAVENOUS | Status: DC | PRN
  Administered 2016-08-08: 60 mg via INTRAVENOUS

## 2016-08-08 MED ORDER — CEFAZOLIN SODIUM-DEXTROSE 2-4 GM/100ML-% IV SOLN
2.0000 g | INTRAVENOUS | Status: AC
  Administered 2016-08-08: 2 g via INTRAVENOUS

## 2016-08-08 MED ORDER — MIDAZOLAM HCL 2 MG/2ML IJ SOLN
INTRAMUSCULAR | Status: AC
  Filled 2016-08-08: qty 2

## 2016-08-08 MED ORDER — LIDOCAINE HCL (PF) 2 % IJ SOLN
INTRAMUSCULAR | Status: AC
  Filled 2016-08-08: qty 2

## 2016-08-08 MED ORDER — HYDROCODONE-ACETAMINOPHEN 5-325 MG PO TABS: 1.0000 | ORAL_TABLET | Freq: Four times a day (QID) | ORAL | 0 refills | Status: DC | PRN

## 2016-08-08 MED ORDER — PROPOFOL 500 MG/50ML IV EMUL
INTRAVENOUS | Status: DC | PRN
  Administered 2016-08-08: 25 ug/kg/min via INTRAVENOUS

## 2016-08-08 MED ORDER — ONDANSETRON HCL 4 MG/2ML IJ SOLN: 4.0000 mg | Freq: Once | INTRAMUSCULAR | Status: DC | PRN

## 2016-08-08 MED ORDER — FENTANYL CITRATE (PF) 100 MCG/2ML IJ SOLN: 25.0000 ug | INTRAMUSCULAR | Status: DC | PRN

## 2016-08-08 MED ORDER — PROPOFOL 10 MG/ML IV BOLUS
INTRAVENOUS | Status: AC
  Filled 2016-08-08: qty 20

## 2016-08-08 SURGICAL SUPPLY — 21 items
CANISTER SUCT 1200ML W/VALVE (MISCELLANEOUS) ×3 IMPLANT
CHLORAPREP W/TINT 26ML (MISCELLANEOUS) ×3 IMPLANT
DERMABOND ADVANCED (GAUZE/BANDAGES/DRESSINGS) ×2
DERMABOND ADVANCED .7 DNX12 (GAUZE/BANDAGES/DRESSINGS) ×1 IMPLANT
DRAPE LAPAROTOMY TRNSV 106X77 (MISCELLANEOUS) ×3 IMPLANT
ELECT REM PT RETURN 9FT ADLT (ELECTROSURGICAL) ×3
ELECTRODE REM PT RTRN 9FT ADLT (ELECTROSURGICAL) ×1 IMPLANT
GAUZE SPONGE 4X4 12PLY STRL (GAUZE/BANDAGES/DRESSINGS) ×3 IMPLANT
GLOVE BIO SURGEON STRL SZ7 (GLOVE) ×3 IMPLANT
GOWN STRL REUS W/ TWL LRG LVL3 (GOWN DISPOSABLE) ×1 IMPLANT
GOWN STRL REUS W/ TWL XL LVL3 (GOWN DISPOSABLE) ×1 IMPLANT
GOWN STRL REUS W/TWL LRG LVL3 (GOWN DISPOSABLE) ×2
GOWN STRL REUS W/TWL XL LVL3 (GOWN DISPOSABLE) ×2
KIT RM TURNOVER STRD PROC AR (KITS) ×3 IMPLANT
LABEL OR SOLS (LABEL) ×3 IMPLANT
NEEDLE HYPO 25X1 1.5 SAFETY (NEEDLE) ×3 IMPLANT
NS IRRIG 500ML POUR BTL (IV SOLUTION) ×3 IMPLANT
PACK BASIN MINOR ARMC (MISCELLANEOUS) ×3 IMPLANT
SUT MNCRL AB 4-0 PS2 18 (SUTURE) ×3 IMPLANT
SUT VICRYL+ 3-0 36IN CT-1 (SUTURE) ×3 IMPLANT
SYRINGE 10CC LL (SYRINGE) ×3 IMPLANT

## 2016-08-08 NOTE — H&P (Signed)
[facility] History & Physical Update  The patient was interviewed and re-examined.  The patient's previous History and Physical has been reviewed and is unchanged.  There is no change in the plan of care. We plan to proceed with the scheduled procedure.  Janaria Mccammon, MD  08/08/2016, 12:23 PM    

## 2016-08-08 NOTE — Anesthesia Post-op Follow-up Note (Cosign Needed)
Anesthesia QCDR form completed.        

## 2016-08-08 NOTE — Op Note (Signed)
[facility]   OPERATIVE NOTE  DATE: 08/08/2016  PRE-OPERATIVE DIAGNOSIS: ESRD, going to Hemodialysis  POST-OPERATIVE DIAGNOSIS: same as above  PROCEDURE: 1.   Removal of peritoneal dialysis catheter  SURGEON: Leotis Pain, MD  ASSISTANT(S): Hezzie Bump, PA-C  ANESTHESIA: general  ESTIMATED BLOOD LOSS: 5 cc  FINDING(S): 1.  none  SPECIMEN(S):  PD catheter  INDICATIONS:   Patient is a 76 y.o.male with ESRD and is now going to hemodialysis.  The peritoneal catheter needs to be removed.  Risks and benefits are discussed.  DESCRIPTION: After obtaining full informed written consent, the patient was brought back to the operating room and placed supine upon the operating table.  The patient received IV antibiotics prior to induction.  After obtaining adequate anesthesia, the patient was prepped and draped in the standard fashion. The previous incision beside the umbilicus were the deep cuff was located was reopened, and the cuff was dissected out at the fascia without difficulty.  The catheter was then removed from the peritoneal cavity and a 0 Vicryl pursestring suture was used to close this opening.  The superficial cuff was then dissected out and freed and the catheter was then transected and removed from the field.  The transverse periumbilical incision was then closed with a 3-0 Vicryl and a 4-0 Monocryl and dermabond was placed.  A dry dressing was placed at the previous catheter exit site.  The patient was then awakened from anesthesia and taken to the recovery room in stable condition.  COMPLICATIONS: None  CONDITION: Stable   Leotis Pain 08/08/2016 3:16 PM

## 2016-08-08 NOTE — Anesthesia Procedure Notes (Signed)
Procedure Name: MAC Performed by: Dionne Bucy Pre-anesthesia Checklist: Patient identified, Emergency Drugs available, Suction available, Patient being monitored and Timeout performed Patient Re-evaluated:Patient Re-evaluated prior to inductionOxygen Delivery Method: Nasal cannula

## 2016-08-08 NOTE — Discharge Instructions (Signed)
AMBULATORY SURGERY  °DISCHARGE INSTRUCTIONS ° ° °1) The drugs that you were given will stay in your system until tomorrow so for the next 24 hours you should not: ° °A) Drive an automobile °B) Make any legal decisions °C) Drink any alcoholic beverage ° ° °2) You may resume regular meals tomorrow.  Today it is better to start with liquids and gradually work up to solid foods. ° °You may eat anything you prefer, but it is better to start with liquids, then soup and crackers, and gradually work up to solid foods. ° ° °3) Please notify your doctor immediately if you have any unusual bleeding, trouble breathing, redness and pain at the surgery site, drainage, fever, or pain not relieved by medication. ° °

## 2016-08-08 NOTE — Anesthesia Postprocedure Evaluation (Signed)
Anesthesia Post Note  Patient: Calvin Good  Procedure(s) Performed: Procedure(s) (LRB): REMOVAL OF A DIALYSIS CATHETER (N/A)  Patient location during evaluation: PACU Anesthesia Type: MAC Level of consciousness: awake and alert and oriented Pain management: pain level controlled Vital Signs Assessment: post-procedure vital signs reviewed and stable Respiratory status: spontaneous breathing Cardiovascular status: blood pressure returned to baseline Anesthetic complications: no     Last Vitals:  Vitals:   08/08/16 1617 08/08/16 1641  BP: (!) 169/52 (!) 168/48  Pulse: 85 83  Resp: 16 18  Temp: 36.4 C     Last Pain:  Vitals:   08/08/16 1617  TempSrc: Temporal  PainSc:                  Buffi Ewton

## 2016-08-08 NOTE — Transfer of Care (Signed)
Immediate Anesthesia Transfer of Care Note  Patient: Calvin Good  Procedure(s) Performed: Procedure(s): REMOVAL OF A DIALYSIS CATHETER (N/A)  Patient Location: PACU  Anesthesia Type:MAC   Level of Consciousness: awake, alert  and oriented  Airway & Oxygen Therapy: Patient Spontanous Breathing  Post-op Assessment: Report given to RN and Post -op Vital signs reviewed and stable  Post vital signs: Reviewed and stable  Last Vitals:  Vitals:   08/08/16 1213 08/08/16 1533  BP: (!) 157/64 (!) 144/51  Pulse: 72 70  Resp: 18 16  Temp: 36.5 C 36.3 C    Last Pain:  Vitals:   08/08/16 1213  TempSrc: Oral         Complications: No apparent anesthesia complications

## 2016-08-08 NOTE — Anesthesia Preprocedure Evaluation (Signed)
Anesthesia Evaluation  Patient identified by MRN, date of birth, ID band Patient awake    Reviewed: Allergy & Precautions, H&P , NPO status , Patient's Chart, lab work & pertinent test results, reviewed documented beta blocker date and time   History of Anesthesia Complications (+) DIFFICULT AIRWAY and history of anesthetic complications  Airway Mallampati: II  TM Distance: >3 FB Neck ROM: full    Dental no notable dental hx. (+) Teeth Intact   Pulmonary neg pulmonary ROS, shortness of breath and with exertion, COPD, former smoker,    Pulmonary exam normal breath sounds clear to auscultation       Cardiovascular Exercise Tolerance: Good hypertension, +CHF  negative cardio ROS   Rhythm:regular Rate:Normal     Neuro/Psych negative neurological ROS  negative psych ROS   GI/Hepatic negative GI ROS, Neg liver ROS, GERD  Medicated,  Endo/Other  negative endocrine ROSdiabetes  Renal/GU ESRFRenal disease     Musculoskeletal   Abdominal   Peds  Hematology negative hematology ROS (+)   Anesthesia Other Findings   Reproductive/Obstetrics negative OB ROS                             Anesthesia Physical Anesthesia Plan  ASA: II  Anesthesia Plan: General   Post-op Pain Management:    Induction:   Airway Management Planned:   Additional Equipment:   Intra-op Plan:   Post-operative Plan:   Informed Consent: I have reviewed the patients History and Physical, chart, labs and discussed the procedure including the risks, benefits and alternatives for the proposed anesthesia with the patient or authorized representative who has indicated his/her understanding and acceptance.     Plan Discussed with: CRNA  Anesthesia Plan Comments:         Anesthesia Quick Evaluation

## 2016-08-08 NOTE — Pre-Procedure Instructions (Signed)
RECEIVED CLEARANCE NOTE FROM ROB TIUMEY PA AND FAXED TO [employer]. SPOKE WITH SUSAN

## 2016-08-09 ENCOUNTER — Encounter: Payer: Self-pay | Admitting: Vascular Surgery

## 2016-08-19 ENCOUNTER — Inpatient Hospital Stay
Admission: EM | Admit: 2016-08-19 | Discharge: 2016-08-22 | DRG: 280 | Disposition: A | Discharge status: Home / Self Care | Payer: Medicare Other | Attending: Internal Medicine | Admitting: Internal Medicine

## 2016-08-19 ENCOUNTER — Other Ambulatory Visit (INDEPENDENT_AMBULATORY_CARE_PROVIDER_SITE_OTHER): Payer: Self-pay | Admitting: Vascular Surgery

## 2016-08-19 ENCOUNTER — Encounter: Payer: Self-pay | Admitting: Emergency Medicine

## 2016-08-19 ENCOUNTER — Encounter (INDEPENDENT_AMBULATORY_CARE_PROVIDER_SITE_OTHER): Payer: Self-pay

## 2016-08-19 ENCOUNTER — Emergency Department: Payer: Medicare Other

## 2016-08-19 DIAGNOSIS — E1122 Type 2 diabetes mellitus with diabetic chronic kidney disease: Secondary | ICD-10-CM

## 2016-08-19 DIAGNOSIS — Z992 Dependence on renal dialysis: Secondary | ICD-10-CM

## 2016-08-19 DIAGNOSIS — J449 Chronic obstructive pulmonary disease, unspecified: Secondary | ICD-10-CM | POA: Diagnosis present

## 2016-08-19 DIAGNOSIS — I509 Heart failure, unspecified: Secondary | ICD-10-CM | POA: Diagnosis present

## 2016-08-19 DIAGNOSIS — Z6833 Body mass index (BMI) 33.0-33.9, adult: Secondary | ICD-10-CM

## 2016-08-19 DIAGNOSIS — R0789 Other chest pain: Secondary | ICD-10-CM | POA: Diagnosis present

## 2016-08-19 DIAGNOSIS — N186 End stage renal disease: Secondary | ICD-10-CM | POA: Diagnosis not present

## 2016-08-19 DIAGNOSIS — Z9079 Acquired absence of other genital organ(s): Secondary | ICD-10-CM

## 2016-08-19 DIAGNOSIS — E785 Hyperlipidemia, unspecified: Secondary | ICD-10-CM | POA: Diagnosis present

## 2016-08-19 DIAGNOSIS — R079 Chest pain, unspecified: Secondary | ICD-10-CM | POA: Diagnosis not present

## 2016-08-19 DIAGNOSIS — D631 Anemia in chronic kidney disease: Secondary | ICD-10-CM | POA: Diagnosis present

## 2016-08-19 DIAGNOSIS — Z794 Long term (current) use of insulin: Secondary | ICD-10-CM

## 2016-08-19 DIAGNOSIS — I10 Essential (primary) hypertension: Secondary | ICD-10-CM | POA: Diagnosis present

## 2016-08-19 DIAGNOSIS — I132 Hypertensive heart and chronic kidney disease with heart failure and with stage 5 chronic kidney disease, or end stage renal disease: Secondary | ICD-10-CM | POA: Diagnosis present

## 2016-08-19 DIAGNOSIS — Z87891 Personal history of nicotine dependence: Secondary | ICD-10-CM

## 2016-08-19 DIAGNOSIS — Z8546 Personal history of malignant neoplasm of prostate: Secondary | ICD-10-CM

## 2016-08-19 DIAGNOSIS — J810 Acute pulmonary edema: Secondary | ICD-10-CM

## 2016-08-19 DIAGNOSIS — N2581 Secondary hyperparathyroidism of renal origin: Secondary | ICD-10-CM | POA: Diagnosis present

## 2016-08-19 DIAGNOSIS — I214 Non-ST elevation (NSTEMI) myocardial infarction: Secondary | ICD-10-CM | POA: Diagnosis not present

## 2016-08-19 DIAGNOSIS — K219 Gastro-esophageal reflux disease without esophagitis: Secondary | ICD-10-CM | POA: Diagnosis present

## 2016-08-19 DIAGNOSIS — R0902 Hypoxemia: Secondary | ICD-10-CM | POA: Diagnosis present

## 2016-08-19 DIAGNOSIS — Z7982 Long term (current) use of aspirin: Secondary | ICD-10-CM

## 2016-08-19 LAB — TROPONIN I: TROPONIN I: 0.11 ng/mL — AB (ref <0.03)

## 2016-08-19 LAB — BASIC METABOLIC PANEL
ANION GAP: 8 (ref 5–15)
BUN: 24 mg/dL — AB (ref 6–20)
CALCIUM: 8.7 mg/dL — AB (ref 8.9–10.3)
CO2: 29 mmol/L (ref 22–32)
CREATININE: 3.85 mg/dL — AB (ref 0.61–1.24)
Chloride: 97 mmol/L — ABNORMAL LOW (ref 101–111)
GFR calc Af Amer: 16 mL/min — ABNORMAL LOW (ref >60)
GFR, EST NON AFRICAN AMERICAN: 14 mL/min — AB (ref >60)
GLUCOSE: 380 mg/dL — AB (ref 65–99)
Potassium: 4 mmol/L (ref 3.5–5.1)
Sodium: 134 mmol/L — ABNORMAL LOW (ref 135–145)

## 2016-08-19 LAB — CBC
HCT: 33.3 % — ABNORMAL LOW (ref 40.0–52.0)
Hemoglobin: 11.5 g/dL — ABNORMAL LOW (ref 13.0–18.0)
MCH: 31 pg (ref 26.0–34.0)
MCHC: 34.6 g/dL (ref 32.0–36.0)
MCV: 89.7 fL (ref 80.0–100.0)
PLATELETS: 154 10*3/uL (ref 150–440)
RBC: 3.71 MIL/uL — ABNORMAL LOW (ref 4.40–5.90)
RDW: 14.4 % (ref 11.5–14.5)
WBC: 9.4 10*3/uL (ref 3.8–10.6)

## 2016-08-19 MED ORDER — FUROSEMIDE 10 MG/ML IJ SOLN
40.0000 mg | Freq: Once | INTRAMUSCULAR | Status: AC
  Administered 2016-08-20: 40 mg via INTRAVENOUS
  Filled 2016-08-19: qty 4

## 2016-08-19 MED ORDER — ASPIRIN 81 MG PO CHEW: 324.0000 mg | CHEWABLE_TABLET | Freq: Once | ORAL | Status: DC

## 2016-08-19 NOTE — ED Notes (Signed)
Pt's room air saturation reading dropped to 88%. Pt placed on 2L via nasal canula. Pt's current saturation 93% on 2L

## 2016-08-19 NOTE — ED Triage Notes (Signed)
Pt arrived via ems from home. Pt reports that roughly 2 hours ago he had mid stern pain that felt like pressure. EMS was called and ems gave pt 324 of asa. Upon arrival pt has no complaints of pain. Pt is alert and oriented. Pt goes to dialysis on MWF and has a fistula in his left arm and port in his right chest. Pt reports going to dialysis today.

## 2016-08-19 NOTE — ED Notes (Signed)
EMS reports initial o2 reading of 89%. Pt placed on 2L nasal canula with an oxygen increase to 97%.

## 2016-08-19 NOTE — ED Provider Notes (Signed)
[facility] Emergency Department Provider Note  Time seen: 10:52 PM  I have reviewed the triage vital signs and the nursing notes.   HISTORY  Chief Complaint Chest Pain    HPI Calvin Good is a 76 y.o. male with a past medical history of CHF, CKD, diabetes, gastric reflux, hypertension, hyperlipidemia, presents to the emergency department for difficulty breathing and chest discomfort. According to the patient he is on dialysis Monday, Wednesday, Friday. He did a full dialysis session today which ended around 4 PM. He states around 7 PM he developed mid central chest discomfort a tightness sensation and difficulty breathing. He also notes over the past few days his feet have been more swollen than normal. Patient does not have a baseline oxygen requirement. Upon arrival patient has a low O2 saturation which case it dips into the upper 80s on room air. States very mild chest discomfort currently which he describes as more of a tightness sensation with mild shortness of breath. Patient states a mild headache, otherwise largely negative review of systems.  Past Medical History:  Diagnosis Date  . Cancer [facility])    Prostate  . CHF (congestive heart failure) ([place])   . Chronic kidney disease   . Complication of anesthesia    hard to wake up  . COPD (chronic obstructive pulmonary disease) ([place])   . Diabetes mellitus without complication ([place])   . Difficult intubation   . Dyspnea   . GERD (gastroesophageal reflux disease)   . Hyperlipidemia   . Hypertension     Patient Active Problem List   Diagnosis Date Noted  . Onychomycosis of toenail 05/13/2016  . Essential hypertension, benign 04/23/2016  . Hyperlipemia 04/23/2016  . Diabetes ([place]) 04/23/2016  . ESRD on dialysis [facility]) 04/23/2016    Past Surgical History:  Procedure Laterality Date  . APPENDECTOMY    . AV FISTULA PLACEMENT Left 05/30/2016   Procedure: ARTERIOVENOUS (AV) FISTULA CREATION ( BRACHIOCEPHALIC  );  Surgeon: [name], MD;  Location: [facility] ORS;  Service: Vascular;  Laterality: Left;  . CAPD INSERTION N/A 05/30/2016   Procedure: LAPAROSCOPIC INSERTION CONTINUOUS AMBULATORY PERITONEAL DIALYSIS  (CAPD) CATHETER;  Surgeon: [name], MD;  Location: [facility] ORS;  Service: Vascular;  Laterality: N/A;  . DIALYSIS/PERMA CATHETER INSERTION    . JOINT REPLACEMENT     right knee   . postate removal     . PROSTATE SURGERY    . REMOVAL OF A DIALYSIS CATHETER N/A 08/08/2016   Procedure: REMOVAL OF A DIALYSIS CATHETER;  Surgeon: [name], MD;  Location: [facility] ORS;  Service: Vascular;  Laterality: N/A;    Prior to Admission medications   Medication Sig Start Date End Date Taking? Authorizing Provider  acetaminophen (TYLENOL) 650 MG CR tablet Take 1,300 mg by mouth every 8 (eight) hours as needed for pain.    [provider]  albuterol (PROVENTIL HFA;VENTOLIN HFA) 108 (90 Base) MCG/ACT inhaler Inhale 2 puffs into the lungs every 6 (six) hours as needed for wheezing or shortness of breath.    [provider]  amLODipine (NORVASC) 10 MG tablet Take 10 mg by mouth every evening.     [provider]  Ascorbic Acid (VITAMIN C ADULT GUMMIES PO) Take 3 Doses by mouth daily.    [provider]  aspirin 81 MG chewable tablet Chew 81 mg by mouth daily.     [provider]  atorvastatin (LIPITOR) 20 MG tablet Take 20 mg by mouth daily.  [provider]  calcium acetate (PHOSLO) 667 MG capsule Take 1,334 mg by mouth 3 (three) times daily with meals.     [provider]  carvedilol (COREG) 25 MG tablet Take 12.5 mg by mouth 2 (two) times daily with a meal.     [provider]  doxazosin (CARDURA) 8 MG tablet Take 8 mg by mouth daily.    [provider]  furosemide (LASIX) 40 MG tablet Take 40 mg by mouth 2 (two) times daily.    [provider]  HYDROcodone-acetaminophen (NORCO) 5-325 MG tablet Take 1 tablet by mouth every  6 (six) hours as needed for moderate pain. 08/08/16   [name], MD  indomethacin (INDOCIN) 50 MG capsule Take 50 mg by mouth 2 (two) times daily as needed (gout).    [provider]  insulin lispro (HUMALOG) 100 UNIT/ML injection Inject 10-15 Units into the skin 3 (three) times daily before meals.     [provider]  insulin NPH Human (HUMULIN N,NOVOLIN N) 100 UNIT/ML injection Inject 60-65 Units into the skin 2 (two) times daily before a meal. 65 units in the morning and 60 units in the evening    [provider]  lisinopril (PRINIVIL,ZESTRIL) 40 MG tablet Take 40 mg by mouth every evening.     [provider]  Melatonin 3 MG CAPS Take 1 capsule by mouth at bedtime.     [provider]  Multiple Vitamins-Minerals (ADULT GUMMY PO) Take 2 tablets by mouth daily.    [provider]  omeprazole (PRILOSEC) 20 MG capsule Take 20 mg by mouth 2 (two) times daily before a meal.    [provider]  VITAMIN D-VITAMIN K PO Take 1 drop by mouth daily. Vit D3 and Vit K liquid 25 mcg of Vit K2 1208 units of D3    [provider]    Allergies  Allergen Reactions  . Tetracyclines & Related Other (See Comments)    unknown  . Morphine Other (See Comments)    confusion    Family History  Problem Relation Age of Onset  . Cancer Father     Social History Social History  Substance Use Topics  . Smoking status: Former Smoker    Quit date: 05/21/1982  . Smokeless tobacco: Never Used  . Alcohol use 1.8 oz/week    3 Glasses of wine per week    Review of Systems Constitutional: Negative for fever. Eyes: Negative for visual changes. ENT: Negative for congestion Cardiovascular: Mild chest pain Respiratory: Mild shortness of breath Gastrointestinal: Negative for abdominal pain Genitourinary: Negative for dysuria. Musculoskeletal: Negative for back pain. Skin: Negative for rash. Neurological: Mild headache. Denies focal  weakness or numbness. All other ROS negative  ____________________________________________   PHYSICAL EXAM:  VITAL SIGNS: ED Triage Vitals  Enc Vitals Group     BP 08/19/16 2229 (!) 148/79     Pulse Rate 08/19/16 2229 (!) 101     Resp 08/19/16 2229 15     Temp 08/19/16 2229 97.8 F (36.6 C)     Temp Source 08/19/16 2229 Oral     SpO2 08/19/16 2229 94 %     Weight 08/19/16 2223 235 lb (106.6 kg)     Height 08/19/16 2223 5\' 8"  (1.727 m)     Head Circumference --      Peak Flow --      Pain Score 08/19/16 2222 0     Pain Loc --  Pain Edu? --      Excl. in [place]? --     Constitutional: Alert and oriented. Well appearing and in no distress. Eyes: Normal exam ENT   Head: Normocephalic and atraumatic.Marland Kitchen   Mouth/Throat: Mucous membranes are moist. Cardiovascular: Normal rate, regular rhythm.  Respiratory: Normal respiratory effort without tachypnea nor retractions. Breath sounds are clear. Right subclavian dialysis line present. Gastrointestinal: Soft and nontender. No distention.   Musculoskeletal: Nontender with normal range of motion in all extremities. Moderate pedal edema, equal bilaterally. Neurologic:  Normal speech and language. No gross focal neurologic deficits Skin:  Skin is warm, dry and intact.  Psychiatric: Mood and affect are normal.   ____________________________________________    EKG  EKG reviewed and interpreted by myself shows normal sinus rhythm at 99 bpm, widened QRS with a normal axis, prolonged QTC of 528 ms, nonspecific ST changes. Lateral ST depressions. ST depressions are somewhat present on prior EKG 08/01/16.  ____________________________________________    RADIOLOGY  Chest x-ray shows bibasilar airspace opacities likely interstitial edema.  ____________________________________________   INITIAL IMPRESSION / ASSESSMENT AND PLAN / ED COURSE  Pertinent labs & imaging results that were available during my care of the patient were  reviewed by me and considered in my medical decision making (see chart for details).  Patient presents to the emergency department for mild chest discomfort/shortness of breath which started several hours ago. The patient's EKG is largely unchanged morphology from prior EKG. Patient's chest x-ray shows interstitial edema. Patient has a low O2 saturation, 88% on room air, placed on 2 L via nasal cannula. No home O2 requirement. Given interstitial edema on chest x-ray with hypoxia on room air anticipate likely admission to the hospital for further diuresis. Labs are pending at this time.  Patient's heart enzyme is elevated 0.11 which is likely due to his renal disease however we have no old troponins for comparison. Patient remains borderline hypoxic 90% on room air with no home O2 requirement. Given the patient's interstitial edema on chest x-ray with hypoxia and elevated troponin we will admit the patient to the hospital for further treatment and likely diuresis. Patient states he urinates most days but not a large amount.  ____________________________________________   FINAL CLINICAL IMPRESSION(S) / ED DIAGNOSES  Pulmonary edema Hypoxia Chest pain    Harvest Dark, MD 08/19/16 2337

## 2016-08-20 ENCOUNTER — Encounter: Payer: Self-pay | Admitting: Emergency Medicine

## 2016-08-20 DIAGNOSIS — Z87891 Personal history of nicotine dependence: Secondary | ICD-10-CM | POA: Diagnosis not present

## 2016-08-20 DIAGNOSIS — I214 Non-ST elevation (NSTEMI) myocardial infarction: Secondary | ICD-10-CM | POA: Diagnosis present

## 2016-08-20 DIAGNOSIS — K219 Gastro-esophageal reflux disease without esophagitis: Secondary | ICD-10-CM | POA: Diagnosis present

## 2016-08-20 DIAGNOSIS — Z9079 Acquired absence of other genital organ(s): Secondary | ICD-10-CM | POA: Diagnosis not present

## 2016-08-20 DIAGNOSIS — I509 Heart failure, unspecified: Secondary | ICD-10-CM | POA: Diagnosis present

## 2016-08-20 DIAGNOSIS — E1122 Type 2 diabetes mellitus with diabetic chronic kidney disease: Secondary | ICD-10-CM | POA: Diagnosis present

## 2016-08-20 DIAGNOSIS — J449 Chronic obstructive pulmonary disease, unspecified: Secondary | ICD-10-CM | POA: Diagnosis present

## 2016-08-20 DIAGNOSIS — N186 End stage renal disease: Secondary | ICD-10-CM | POA: Diagnosis present

## 2016-08-20 DIAGNOSIS — R079 Chest pain, unspecified: Secondary | ICD-10-CM | POA: Diagnosis present

## 2016-08-20 DIAGNOSIS — D631 Anemia in chronic kidney disease: Secondary | ICD-10-CM | POA: Diagnosis present

## 2016-08-20 DIAGNOSIS — Z992 Dependence on renal dialysis: Secondary | ICD-10-CM | POA: Diagnosis not present

## 2016-08-20 DIAGNOSIS — Z8546 Personal history of malignant neoplasm of prostate: Secondary | ICD-10-CM | POA: Diagnosis not present

## 2016-08-20 DIAGNOSIS — R0902 Hypoxemia: Secondary | ICD-10-CM | POA: Diagnosis present

## 2016-08-20 DIAGNOSIS — Z794 Long term (current) use of insulin: Secondary | ICD-10-CM | POA: Diagnosis not present

## 2016-08-20 DIAGNOSIS — N2581 Secondary hyperparathyroidism of renal origin: Secondary | ICD-10-CM | POA: Diagnosis present

## 2016-08-20 DIAGNOSIS — R0789 Other chest pain: Secondary | ICD-10-CM | POA: Diagnosis present

## 2016-08-20 DIAGNOSIS — I132 Hypertensive heart and chronic kidney disease with heart failure and with stage 5 chronic kidney disease, or end stage renal disease: Secondary | ICD-10-CM | POA: Diagnosis present

## 2016-08-20 DIAGNOSIS — Z7982 Long term (current) use of aspirin: Secondary | ICD-10-CM | POA: Diagnosis not present

## 2016-08-20 DIAGNOSIS — Z6833 Body mass index (BMI) 33.0-33.9, adult: Secondary | ICD-10-CM | POA: Diagnosis not present

## 2016-08-20 DIAGNOSIS — E785 Hyperlipidemia, unspecified: Secondary | ICD-10-CM | POA: Diagnosis present

## 2016-08-20 LAB — BASIC METABOLIC PANEL
Anion gap: 9 (ref 5–15)
BUN: 27 mg/dL — AB (ref 6–20)
CALCIUM: 8.3 mg/dL — AB (ref 8.9–10.3)
CO2: 28 mmol/L (ref 22–32)
CREATININE: 4.07 mg/dL — AB (ref 0.61–1.24)
Chloride: 97 mmol/L — ABNORMAL LOW (ref 101–111)
GFR calc Af Amer: 15 mL/min — ABNORMAL LOW (ref >60)
GFR calc non Af Amer: 13 mL/min — ABNORMAL LOW (ref >60)
GLUCOSE: 354 mg/dL — AB (ref 65–99)
Potassium: 3.8 mmol/L (ref 3.5–5.1)
Sodium: 134 mmol/L — ABNORMAL LOW (ref 135–145)

## 2016-08-20 LAB — GLUCOSE, CAPILLARY
GLUCOSE-CAPILLARY: 263 mg/dL — AB (ref 65–99)
GLUCOSE-CAPILLARY: 196 mg/dL — AB (ref 65–99)
GLUCOSE-CAPILLARY: 178 mg/dL — AB (ref 65–99)
GLUCOSE-CAPILLARY: 228 mg/dL — AB (ref 65–99)

## 2016-08-20 LAB — CBC
HCT: 32.8 % — ABNORMAL LOW (ref 40.0–52.0)
HEMOGLOBIN: 11.4 g/dL — AB (ref 13.0–18.0)
MCH: 31.2 pg (ref 26.0–34.0)
MCHC: 34.7 g/dL (ref 32.0–36.0)
MCV: 89.9 fL (ref 80.0–100.0)
PLATELETS: 152 10*3/uL (ref 150–440)
RBC: 3.65 MIL/uL — ABNORMAL LOW (ref 4.40–5.90)
RDW: 14.4 % (ref 11.5–14.5)
WBC: 8.9 10*3/uL (ref 3.8–10.6)

## 2016-08-20 LAB — TROPONIN I
TROPONIN I: 2.61 ng/mL — AB (ref <0.03)
TROPONIN I: 3.13 ng/mL — AB (ref <0.03)
TROPONIN I: 2.28 ng/mL — AB (ref <0.03)

## 2016-08-20 LAB — PROTIME-INR
INR: 1.11
Prothrombin Time: 14.4 seconds (ref 11.4–15.2)

## 2016-08-20 MED ORDER — INSULIN ASPART 100 UNIT/ML ~~LOC~~ SOLN
0.0000 [IU] | Freq: Every day | SUBCUTANEOUS | Status: DC
  Administered 2016-08-20: 2 [IU] via SUBCUTANEOUS
  Filled 2016-08-20: qty 2

## 2016-08-20 MED ORDER — ASPIRIN EC 81 MG PO TBEC
81.0000 mg | DELAYED_RELEASE_TABLET | Freq: Every day | ORAL | Status: DC
  Administered 2016-08-20: 81 mg via ORAL
  Filled 2016-08-20: qty 1

## 2016-08-20 MED ORDER — CALCIUM ACETATE (PHOS BINDER) 667 MG PO CAPS
1334.0000 mg | ORAL_CAPSULE | Freq: Three times a day (TID) | ORAL | Status: DC
  Administered 2016-08-20 – 2016-08-22 (×4): 1334 mg via ORAL
  Filled 2016-08-20 (×4): qty 2

## 2016-08-20 MED ORDER — ONDANSETRON HCL 4 MG PO TABS: 4.0000 mg | ORAL_TABLET | Freq: Four times a day (QID) | ORAL | Status: DC | PRN

## 2016-08-20 MED ORDER — HEPARIN SODIUM (PORCINE) 5000 UNIT/ML IJ SOLN
5000.0000 [IU] | Freq: Three times a day (TID) | INTRAMUSCULAR | Status: DC
  Administered 2016-08-20 – 2016-08-22 (×6): 5000 [IU] via SUBCUTANEOUS
  Filled 2016-08-20 (×6): qty 1

## 2016-08-20 MED ORDER — ATORVASTATIN CALCIUM 20 MG PO TABS: 20.0000 mg | ORAL_TABLET | Freq: Every day | ORAL | Status: DC

## 2016-08-20 MED ORDER — PANTOPRAZOLE SODIUM 40 MG PO TBEC
40.0000 mg | DELAYED_RELEASE_TABLET | Freq: Two times a day (BID) | ORAL | Status: DC
  Administered 2016-08-20 – 2016-08-21 (×3): 40 mg via ORAL
  Filled 2016-08-20 (×3): qty 1

## 2016-08-20 MED ORDER — AMLODIPINE BESYLATE 10 MG PO TABS
10.0000 mg | ORAL_TABLET | Freq: Every evening | ORAL | Status: DC
  Administered 2016-08-20: 10 mg via ORAL
  Filled 2016-08-20: qty 2

## 2016-08-20 MED ORDER — OXYCODONE HCL 5 MG PO TABS: 5.0000 mg | ORAL_TABLET | ORAL | Status: DC | PRN

## 2016-08-20 MED ORDER — SODIUM CHLORIDE 0.9 % IV SOLN
INTRAVENOUS | Status: DC
  Administered 2016-08-21: 07:00:00 via INTRAVENOUS

## 2016-08-20 MED ORDER — DOXAZOSIN MESYLATE 4 MG PO TABS
8.0000 mg | ORAL_TABLET | Freq: Every day | ORAL | Status: DC
  Administered 2016-08-20: 8 mg via ORAL
  Filled 2016-08-20 (×2): qty 1

## 2016-08-20 MED ORDER — ACETAMINOPHEN 650 MG RE SUPP: 650.0000 mg | Freq: Four times a day (QID) | RECTAL | Status: DC | PRN

## 2016-08-20 MED ORDER — SODIUM CHLORIDE 0.9 % IV SOLN
250.0000 mL | INTRAVENOUS | Status: DC | PRN
Start: 2016-08-20 — End: 2016-08-21

## 2016-08-20 MED ORDER — ACETAMINOPHEN 325 MG PO TABS: 650.0000 mg | ORAL_TABLET | Freq: Four times a day (QID) | ORAL | Status: DC | PRN

## 2016-08-20 MED ORDER — FUROSEMIDE 40 MG PO TABS
40.0000 mg | ORAL_TABLET | Freq: Two times a day (BID) | ORAL | Status: DC
  Administered 2016-08-20 – 2016-08-22 (×3): 40 mg via ORAL
  Filled 2016-08-20 (×3): qty 1

## 2016-08-20 MED ORDER — SODIUM CHLORIDE 0.9% FLUSH: 3.0000 mL | INTRAVENOUS | Status: DC | PRN

## 2016-08-20 MED ORDER — SODIUM CHLORIDE 0.9% FLUSH
3.0000 mL | Freq: Two times a day (BID) | INTRAVENOUS | Status: DC
  Administered 2016-08-20: 3 mL via INTRAVENOUS

## 2016-08-20 MED ORDER — ONDANSETRON HCL 4 MG/2ML IJ SOLN: 4.0000 mg | Freq: Four times a day (QID) | INTRAMUSCULAR | Status: DC | PRN

## 2016-08-20 MED ORDER — ATORVASTATIN CALCIUM 20 MG PO TABS
40.0000 mg | ORAL_TABLET | Freq: Every day | ORAL | Status: DC
  Administered 2016-08-20: 40 mg via ORAL
  Filled 2016-08-20: qty 2

## 2016-08-20 MED ORDER — LISINOPRIL 20 MG PO TABS
40.0000 mg | ORAL_TABLET | Freq: Every evening | ORAL | Status: DC
  Administered 2016-08-20: 40 mg via ORAL
  Filled 2016-08-20: qty 4

## 2016-08-20 MED ORDER — ASPIRIN 81 MG PO CHEW
81.0000 mg | CHEWABLE_TABLET | ORAL | Status: AC
  Administered 2016-08-21: 81 mg via ORAL
  Filled 2016-08-20: qty 1

## 2016-08-20 MED ORDER — INSULIN ASPART 100 UNIT/ML ~~LOC~~ SOLN
0.0000 [IU] | Freq: Three times a day (TID) | SUBCUTANEOUS | Status: DC
  Administered 2016-08-20 (×2): 2 [IU] via SUBCUTANEOUS
  Administered 2016-08-20: 6 [IU] via SUBCUTANEOUS
  Administered 2016-08-21 – 2016-08-22 (×2): 3 [IU] via SUBCUTANEOUS
  Filled 2016-08-20: qty 3
  Filled 2016-08-20: qty 6
  Filled 2016-08-20: qty 3
  Filled 2016-08-20 (×2): qty 2

## 2016-08-20 MED ORDER — CARVEDILOL 12.5 MG PO TABS
12.5000 mg | ORAL_TABLET | Freq: Two times a day (BID) | ORAL | Status: DC
  Administered 2016-08-20 – 2016-08-22 (×4): 12.5 mg via ORAL
  Filled 2016-08-20: qty 1
  Filled 2016-08-20 (×3): qty 2

## 2016-08-20 NOTE — Care Management (Signed)
Patient placed in observation for chest pain pain and has since been admitted due to continued rise in his troponins.  Patient is esrd and receives chronic dialysis at [facility] Elvera Bicker with [employer] notified

## 2016-08-20 NOTE — Progress Notes (Signed)
[facility]  ROUNDING NOTE   Subjective:   [name] admitted to [facility] on 08/19/2016 for Acute pulmonary edema ([place]) [J81.0] Hypoxia [R09.02] Chest pain, unspecified type [R07.9]   Last dialysis Monday  Objective:  Vital signs in last 24 hours:  Temp:  [97.8 F (36.6 C)-98.3 F (36.8 C)] 98.3 F (36.8 C) (05/22 1137) Pulse Rate:  [71-101] 71 (05/22 1137) Resp:  [15-20] 20 (05/22 1137) BP: (131-154)/(46-79) 144/46 (05/22 1137) SpO2:  [85 %-96 %] 91 % (05/22 1137) Weight:  [105.8 kg (233 lb 3.2 oz)-106.6 kg (235 lb)] 105.8 kg (233 lb 3.2 oz) (05/22 0334)  Weight change:  Filed Weights   08/19/16 2223 08/20/16 0334  Weight: 106.6 kg (235 lb) 105.8 kg (233 lb 3.2 oz)    Intake/Output: I/O last 3 completed shifts: In: -  Out: 200 [Urine:200]   Intake/Output this shift:  No intake/output data recorded.  Physical Exam: General: NAD, sitting up in bed  Head: Normocephalic, atraumatic. Moist oral mucosal membranes  Eyes: Anicteric, PERRL  Neck: Supple, trachea midline  Lungs:  Clear to auscultation  Heart: Regular rate and rhythm  Abdomen:  Soft, nontender,   Extremities: no peripheral edema.  Neurologic: Nonfocal, moving all four extremities  Skin: No lesions  Access: Right AVF    Basic Metabolic Panel:  Recent Labs Lab 08/19/16 2233 08/20/16 0314  NA 134* 134*  K 4.0 3.8  CL 97* 97*  CO2 29 28  GLUCOSE 380* 354*  BUN 24* 27*  CREATININE 3.85* 4.07*  CALCIUM 8.7* 8.3*    Liver Function Tests: No results for input(s): AST, ALT, ALKPHOS, BILITOT, PROT, ALBUMIN in the last 168 hours. No results for input(s): LIPASE, AMYLASE in the last 168 hours. No results for input(s): AMMONIA in the last 168 hours.  CBC:  Recent Labs Lab 08/19/16 2233 08/20/16 0314  WBC 9.4 8.9  HGB 11.5* 11.4*  HCT 33.3* 32.8*  MCV 89.7 89.9  PLT 154 152    Cardiac Enzymes:  Recent Labs Lab 08/19/16 2233 08/20/16 0203 08/20/16 0314 08/20/16 0846   TROPONINI 0.11* 1.81* 2.61* 3.13*    BNP: Invalid input(s): POCBNP  CBG:  Recent Labs Lab 08/20/16 0759 08/20/16 1137  GLUCAP 263* 196*    Microbiology: Results for orders placed or performed during the hospital encounter of 08/06/16  Surgical pcr screen     Status: None   Collection Time: 08/06/16 11:53 AM  Result Value Ref Range Status   MRSA, PCR NEGATIVE NEGATIVE Final   Staphylococcus aureus NEGATIVE NEGATIVE Final    Comment:        The Xpert SA Assay (FDA approved for NASAL specimens in patients over 78 years of age), is one component of a comprehensive surveillance program.  Test performance has been validated by [facility] for patients greater than or equal to 78 year old. It is not intended to diagnose infection nor to guide or monitor treatment.     Coagulation Studies: No results for input(s): LABPROT, INR in the last 72 hours.  Urinalysis: No results for input(s): COLORURINE, LABSPEC, PHURINE, GLUCOSEU, HGBUR, BILIRUBINUR, KETONESUR, PROTEINUR, UROBILINOGEN, NITRITE, LEUKOCYTESUR in the last 72 hours.  Invalid input(s): APPERANCEUR    Imaging: Dg Chest Portable 1 View  Result Date: 08/19/2016 CLINICAL DATA:  Acute onset of midsternal chest pain. Initial encounter. EXAM: PORTABLE CHEST 1 VIEW COMPARISON:  None. FINDINGS: The lungs are well-aerated. Mild bibasilar opacities may reflect mild interstitial edema or atelectasis. There is no evidence of pleural  effusion or pneumothorax. The cardiomediastinal silhouette is borderline enlarged. No acute osseous abnormalities are seen. A right-sided dual-lumen catheter is noted ending about the cavoatrial junction. IMPRESSION: Mild bibasilar airspace opacities may reflect mild interstitial edema or atelectasis. Borderline cardiomegaly. Electronically Signed   By: Garald Balding M.D.   On: 08/19/2016 22:50     Medications:    . amLODipine  10 mg Oral QPM  . aspirin EC  81 mg Oral Daily  . atorvastatin   20 mg Oral Daily  . calcium acetate  1,334 mg Oral TID WC  . carvedilol  12.5 mg Oral BID WC  . doxazosin  8 mg Oral Daily  . furosemide  40 mg Oral BID  . heparin  5,000 Units Subcutaneous Q8H  . insulin aspart  0-5 Units Subcutaneous QHS  . insulin aspart  0-9 Units Subcutaneous TID WC  . lisinopril  40 mg Oral QPM  . pantoprazole  40 mg Oral BID   acetaminophen **OR** acetaminophen, ondansetron **OR** ondansetron (ZOFRAN) IV, oxyCODONE  Assessment/ Plan:  [name] is a 76 y.o. white male with diabetes mellitus type II insulin dependent, hypertension, hyperlipidemia, prostate cancer status post prostatectomy, right total knee, appendectomy.   MWF [facility] [place] AVF  1. End Stage Renal Disease MWF: last dialysis Monday. No acute indication for dialysis. Next treatment for tomorrow.   2. Hypertension: blood pressure at goal.  - carvedilol, amlodipine, lisinopril, doxazosin  3. Anemia of chronic kidney disease: hemoglobin 11.4. Mircera as outpatient.   4. Secondary Hyperparathyroidism: outpatient PTH 263 on 5/18.  - calcium acetate    LOS: 0 Wilmont Olund 5/22/201811:49 AM

## 2016-08-20 NOTE — Progress Notes (Signed)
Inpatient Diabetes Program Recommendations  AACE/ADA: New Consensus Statement on Inpatient Glycemic Control (2015)  Target Ranges:  Prepandial:   less than 140 mg/dL      Peak postprandial:   less than 180 mg/dL (1-2 hours)      Critically ill patients:  140 - 180 mg/dL   Lab Results  Component Value Date   GLUCAP 263 (H) 08/20/2016    Review of Glycemic Control:  Results for DAKOTAH, ORREGO (MRN [id number]) as of 08/20/2016 11:00  Ref. Range 08/20/2016 07:59  Glucose-Capillary Latest Ref Range: 65 - 99 mg/dL 263 (H)    Diabetes history: Type 2 diabetes Outpatient Diabetes medications: NPH 60 units q HS, Humalog 10 units tid with meals Current orders for Inpatient glycemic control:  Novolog sensitive tid with meals and HS  Inpatient Diabetes Program Recommendations:    If CBG's>200 mg/dL, consider adding Levemir 20 units daily. Note lunchtime CBG down to 196 mg/dL.  Will follow.    Thanks, Adah Perl, RN, BC-ADM Inpatient Diabetes Coordinator Pager [phone] (8a-5p)

## 2016-08-20 NOTE — Progress Notes (Signed)
Patient resting in the bed at this time, awaiting to see dr Jerrye Beavers , trop, 2.28 called to dr patel, no c/o of chest pain at this time

## 2016-08-20 NOTE — Care Management Obs Status (Signed)
[facility] NOTIFICATION   Patient Details  Name: Calvin Good MRN: [id number] Date of Birth: [birth year]   Medicare Observation Status Notification Given:  No Made inpatient < 24 hours   Katrina Stack, RN 08/20/2016, 10:02 AM

## 2016-08-20 NOTE — H&P (Signed)
[facility] [place] NAME: Calvin Good    MR#:  [id number]  DATE OF BIRTH:  January 31, 1941  DATE OF ADMISSION:  08/19/2016  PRIMARY CARE PHYSICIAN: [name], MD   REQUESTING/REFERRING PHYSICIAN: Kerman Passey, MD  CHIEF COMPLAINT:   Chief Complaint  Patient presents with  . Chest Pain    HISTORY OF PRESENT ILLNESS:  Calvin Good  is a 76 y.o. male who presents with Chest heaviness. Patient is dialysis patient, and states that he had transient episodes of chest heaviness the past, but these always improved with rest. Tonight the sensation would not go away. He came to the ED for evaluation. Here his troponin was found to be mildly elevated at 0.11. Hospitalists were called for admission and further evaluation  PAST MEDICAL HISTORY:   Past Medical History:  Diagnosis Date  . Cancer [facility])    Prostate  . CHF (congestive heart failure) ([place])   . Chronic kidney disease   . Complication of anesthesia    hard to wake up  . COPD (chronic obstructive pulmonary disease) ([place])   . Diabetes mellitus without complication ([place])   . Difficult intubation   . Dyspnea   . GERD (gastroesophageal reflux disease)   . Hyperlipidemia   . Hypertension     PAST SURGICAL HISTORY:   Past Surgical History:  Procedure Laterality Date  . APPENDECTOMY    . AV FISTULA PLACEMENT Left 05/30/2016   Procedure: ARTERIOVENOUS (AV) FISTULA CREATION ( BRACHIOCEPHALIC );  Surgeon: [name], MD;  Location: [facility] ORS;  Service: Vascular;  Laterality: Left;  . CAPD INSERTION N/A 05/30/2016   Procedure: LAPAROSCOPIC INSERTION CONTINUOUS AMBULATORY PERITONEAL DIALYSIS  (CAPD) CATHETER;  Surgeon: [name], MD;  Location: [facility] ORS;  Service: Vascular;  Laterality: N/A;  . DIALYSIS/PERMA CATHETER INSERTION    . JOINT REPLACEMENT     right knee   . postate removal     . PROSTATE SURGERY    . REMOVAL OF A DIALYSIS CATHETER N/A 08/08/2016   Procedure:  REMOVAL OF A DIALYSIS CATHETER;  Surgeon: [name], MD;  Location: [facility] ORS;  Service: Vascular;  Laterality: N/A;    SOCIAL HISTORY:   Social History  Substance Use Topics  . Smoking status: Former Smoker    Quit date: 05/21/1982  . Smokeless tobacco: Never Used  . Alcohol use 1.8 oz/week    3 Glasses of wine per week    FAMILY HISTORY:   Family History  Problem Relation Age of Onset  . Cancer Father     DRUG ALLERGIES:   Allergies  Allergen Reactions  . Tetracyclines & Related Other (See Comments)    Reaction: unknown  . Morphine Other (See Comments)    Reaction: confusion    MEDICATIONS AT HOME:   Prior to Admission medications   Medication Sig Start Date End Date Taking? Authorizing Provider  amLODipine (NORVASC) 10 MG tablet Take 10 mg by mouth every evening.    Yes [provider]  aspirin 81 MG EC tablet Chew 81 mg by mouth daily.    Yes [provider]  atorvastatin (LIPITOR) 20 MG tablet Take 20 mg by mouth daily.   Yes [provider]  calcium acetate (PHOSLO) 667 MG capsule Take 1,334 mg by mouth 3 (three) times daily with meals.    Yes [provider]  carvedilol (COREG) 25 MG tablet Take 12.5 mg by mouth 2 (two) times daily with a  meal.    Yes [provider]  doxazosin (CARDURA) 8 MG tablet Take 8 mg by mouth daily.   Yes [provider]  furosemide (LASIX) 40 MG tablet Take 40 mg by mouth 2 (two) times daily.   Yes [provider]  indomethacin (INDOCIN) 50 MG capsule Take 50 mg by mouth 2 (two) times daily as needed (gout).   Yes [provider]  insulin lispro (HUMALOG) 100 UNIT/ML injection Inject 10 Units into the skin 3 (three) times daily before meals.    Yes [provider]  insulin NPH Human (HUMULIN N,NOVOLIN N) 100 UNIT/ML injection Inject 60 Units into the skin at bedtime.    Yes [provider]  lisinopril (PRINIVIL,ZESTRIL) 40 MG tablet Take 40 mg by  mouth every evening.    Yes [provider]  omeprazole (PRILOSEC) 20 MG capsule Take 20 mg by mouth 2 (two) times daily before a meal.   Yes [provider]    REVIEW OF SYSTEMS:  Review of Systems  Constitutional: Negative for chills, fever, malaise/fatigue and weight loss.  HENT: Negative for ear pain, hearing loss and tinnitus.   Eyes: Negative for blurred vision, double vision, pain and redness.  Respiratory: Negative for cough, hemoptysis and shortness of breath.   Cardiovascular: Positive for chest pain. Negative for palpitations, orthopnea and leg swelling.  Gastrointestinal: Negative for abdominal pain, constipation, diarrhea, nausea and vomiting.  Genitourinary: Negative for dysuria, frequency and hematuria.  Musculoskeletal: Negative for back pain, joint pain and neck pain.  Skin:       No acne, rash, or lesions  Neurological: Negative for dizziness, tremors, focal weakness and weakness.  Endo/Heme/Allergies: Negative for polydipsia. Does not bruise/bleed easily.  Psychiatric/Behavioral: Negative for depression. The patient is not nervous/anxious and does not have insomnia.      VITAL SIGNS:   Vitals:   08/19/16 2223 08/19/16 2229 08/20/16 0000 08/20/16 0030  BP:  (!) 148/79 (!) 131/50 (!) 154/54  Pulse:  (!) 101 76 76  Resp:  15  16  Temp:  97.8 F (36.6 C)    TempSrc:  Oral    SpO2:  94% (!) 89% 96%  Weight: 106.6 kg (235 lb)     Height: 5\' 8"  (1.727 m)      Wt Readings from Last 3 Encounters:  08/19/16 106.6 kg (235 lb)  08/01/16 104.3 kg (230 lb)  06/25/16 105.2 kg (232 lb)    PHYSICAL EXAMINATION:  Physical Exam  Vitals reviewed. Constitutional: He is oriented to person, place, and time. He appears well-developed and well-nourished. No distress.  HENT:  Head: Normocephalic and atraumatic.  Mouth/Throat: Oropharynx is clear and moist.  Eyes: Conjunctivae and EOM are normal. Pupils are equal, round, and reactive to light. No scleral  icterus.  Neck: Normal range of motion. Neck supple. No JVD present. No thyromegaly present.  Cardiovascular: Normal rate, regular rhythm and intact distal pulses.  Exam reveals no gallop and no friction rub.   No murmur heard. Respiratory: Effort normal and breath sounds normal. No respiratory distress. He has no wheezes. He has no rales.  GI: Soft. Bowel sounds are normal. He exhibits no distension. There is no tenderness.  Musculoskeletal: Normal range of motion. He exhibits no edema.  No arthritis, no gout  Lymphadenopathy:    He has no cervical adenopathy.  Neurological: He is alert and oriented to person, place, and time. No cranial nerve deficit.  No dysarthria, no aphasia  Skin: Skin is warm  and dry. No rash noted. No erythema.  Psychiatric: He has a normal mood and affect. His behavior is normal. Judgment and thought content normal.    LABORATORY PANEL:   CBC  Recent Labs Lab 08/19/16 2233  WBC 9.4  HGB 11.5*  HCT 33.3*  PLT 154   ------------------------------------------------------------------------------------------------------------------  Chemistries   Recent Labs Lab 08/19/16 2233  NA 134*  K 4.0  CL 97*  CO2 29  GLUCOSE 380*  BUN 24*  CREATININE 3.85*  CALCIUM 8.7*   ------------------------------------------------------------------------------------------------------------------  Cardiac Enzymes  Recent Labs Lab 08/19/16 2233  TROPONINI 0.11*   ------------------------------------------------------------------------------------------------------------------  RADIOLOGY:  Dg Chest Portable 1 View  Result Date: 08/19/2016 CLINICAL DATA:  Acute onset of midsternal chest pain. Initial encounter. EXAM: PORTABLE CHEST 1 VIEW COMPARISON:  None. FINDINGS: The lungs are well-aerated. Mild bibasilar opacities may reflect mild interstitial edema or atelectasis. There is no evidence of pleural effusion or pneumothorax. The cardiomediastinal silhouette is  borderline enlarged. No acute osseous abnormalities are seen. A right-sided dual-lumen catheter is noted ending about the cavoatrial junction. IMPRESSION: Mild bibasilar airspace opacities may reflect mild interstitial edema or atelectasis. Borderline cardiomegaly. Electronically Signed   By: Garald Balding M.D.   On: 08/19/2016 22:50    EKG:   Orders placed or performed during the hospital encounter of 08/19/16  . ED EKG within 10 minutes  . ED EKG within 10 minutes    IMPRESSION AND PLAN:  Principal Problem:   Chest tightness - troponin mildly elevated, we will trend them serially tonight, get an echocardiogram and cardiology consult the morning. Active Problems:   ESRD on dialysis [facility]) - nephrology consult for dialysis support   Essential hypertension, benign - stable, continue home meds   Diabetes ([place]) - sliding scale insulin with corresponding glucose checks   Hyperlipemia - continue home meds   GERD (gastroesophageal reflux disease) - home dose PPI  All the records are reviewed and case discussed with ED provider. Management plans discussed with the patient and/or family.  DVT PROPHYLAXIS: SubQ heparin  GI PROPHYLAXIS: PPI  ADMISSION STATUS: Observation  CODE STATUS: Full Code Status History    This patient does not have a recorded code status. Please follow your organizational policy for patients in this situation.    Advance Directive Documentation     Most Recent Value  Type of Advance Directive  Healthcare Power of Attorney  Pre-existing out of facility DNR order (yellow form or pink MOST form)  -  "MOST" Form in Place?  -      TOTAL TIME TAKING CARE OF THIS PATIENT: 40 minutes.   Spyridon Hornstein [place] 08/20/2016, 12:46 AM  [facility]  Office  [phone]  CC: Primary care physician; [name], MD  Note:  This document was prepared using Dragon voice recognition software and may include unintentional dictation errors.

## 2016-08-20 NOTE — Progress Notes (Addendum)
[facility] [place] NAME: Calvin Good    MR#:  [id number]  DATE OF BIRTH:  July 19, 1940  SUBJECTIVE:   Came in with chest tighness and found to have elevated troponin REVIEW OF SYSTEMS:   Review of Systems  Constitutional: Negative for chills, fever and weight loss.  HENT: Negative for ear discharge, ear pain and nosebleeds.   Eyes: Negative for blurred vision, pain and discharge.  Respiratory: Negative for sputum production, shortness of breath, wheezing and stridor.   Cardiovascular: Negative for chest pain, palpitations, orthopnea and PND.  Gastrointestinal: Negative for abdominal pain, diarrhea, nausea and vomiting.  Genitourinary: Negative for frequency and urgency.  Musculoskeletal: Negative for back pain and joint pain.  Neurological: Negative for sensory change, speech change, focal weakness and weakness.  Psychiatric/Behavioral: Negative for depression and hallucinations. The patient is not nervous/anxious.    Tolerating Diet:npo Tolerating PT: pending  DRUG ALLERGIES:   Allergies  Allergen Reactions  . Tetracyclines & Related Other (See Comments)    Reaction: unknown  . Morphine Other (See Comments)    Reaction: confusion    VITALS:  Blood pressure (!) 144/47, pulse 72, temperature 98.1 F (36.7 C), temperature source Oral, resp. rate 18, height 5\' 9"  (1.753 m), weight 105.8 kg (233 lb 3.2 oz), SpO2 95 %.  PHYSICAL EXAMINATION:   Physical Exam  GENERAL:  76 y.o.-year-old patient lying in the bed with no acute distress.  EYES: Pupils equal, round, reactive to light and accommodation. No scleral icterus. Extraocular muscles intact.  HEENT: Head atraumatic, normocephalic. Oropharynx and nasopharynx clear.  NECK:  Supple, no jugular venous distention. No thyroid enlargement, no tenderness.  LUNGS: Normal breath sounds bilaterally, no wheezing, rales, rhonchi. No use of accessory muscles of respiration.   CARDIOVASCULAR: S1, S2 normal. No murmurs, rubs, or gallops.  ABDOMEN: Soft, nontender, nondistended. Bowel sounds present. No organomegaly or mass.  EXTREMITIES: No cyanosis, clubbing or edema b/l.    NEUROLOGIC: Cranial nerves II through XII are intact. No focal Motor or sensory deficits b/l.   PSYCHIATRIC:  patient is alert and oriented x 3.  SKIN: No obvious rash, lesion, or ulcer.   LABORATORY PANEL:  CBC  Recent Labs Lab 08/20/16 0314  WBC 8.9  HGB 11.4*  HCT 32.8*  PLT 152    Chemistries   Recent Labs Lab 08/20/16 0314  NA 134*  K 3.8  CL 97*  CO2 28  GLUCOSE 354*  BUN 27*  CREATININE 4.07*  CALCIUM 8.3*   Cardiac Enzymes  Recent Labs Lab 08/20/16 0846  TROPONINI 3.13*   RADIOLOGY:  Dg Chest Portable 1 View  Result Date: 08/19/2016 CLINICAL DATA:  Acute onset of midsternal chest pain. Initial encounter. EXAM: PORTABLE CHEST 1 VIEW COMPARISON:  None. FINDINGS: The lungs are well-aerated. Mild bibasilar opacities may reflect mild interstitial edema or atelectasis. There is no evidence of pleural effusion or pneumothorax. The cardiomediastinal silhouette is borderline enlarged. No acute osseous abnormalities are seen. A right-sided dual-lumen catheter is noted ending about the cavoatrial junction. IMPRESSION: Mild bibasilar airspace opacities may reflect mild interstitial edema or atelectasis. Borderline cardiomegaly. Electronically Signed   By: Garald Balding M.D.   On: 08/19/2016 22:50   ASSESSMENT AND PLAN:  Calvin Good  is a 76 y.o. male who presents with Chest heaviness. Patient is dialysis patient, and states that he had transient episodes of chest heaviness the past, but these always improved with rest. Tonight the sensation would not go  away. He came to the ED for evaluation. Here his troponin was found to be mildly elevated at 0.11  1. Non-Q wave MI  Patient came in with -Chest tightness/heaviness yesterday without radiation of pain -pt has risk  factors with hypertension and diabetes -Troponin 0.11 --1.81 --2.61 --- 3.13  -EKG shows normal sinus rhythm with T-wave changes in lateral leads -Dr Clayborn Bigness to see pt. Likely cath this afternoon  2.ESRD on dialysis [facility]) - nephrology consult for dialysis support  3.Essential hypertension, benign - stable, continue home meds  4.Diabetes ([place]) - sliding scale insulin with corresponding glucose checks  5.Hyperlipemia - continue home meds  6.GERD (gastroesophageal reflux disease) - home dose PPI  Case discussed with Care Management/Social Worker. Management plans discussed with the patient, family and they are in agreement.  CODE STATUS: FULL  DVT Prophylaxis: heparin  TOTAL TIME TAKING CARE OF THIS PATIENT: 30 minutes.  >50% time spent on counselling and coordination of care  POSSIBLE D/C IN 1-2 DAYS, DEPENDING ON CLINICAL CONDITION.  Note: This dictation was prepared with Dragon dictation along with smaller phrase technology. Any transcriptional errors that result from this process are unintentional.  Emersen Mascari M.D on 08/20/2016 at 10:22 AM  Between 7am to 6pm - Pager - [phone]  After 6pm go to [email or web address] - password [email or web address] [facility]  Office  [phone]  CC: Primary care physician; [name], MD

## 2016-08-21 ENCOUNTER — Ambulatory Visit: Admission: RE | Admit: 2016-08-21 | Payer: Medicare Other | Source: Ambulatory Visit | Admitting: Vascular Surgery

## 2016-08-21 ENCOUNTER — Inpatient Hospital Stay
Admit: 2016-08-21 | Discharge: 2016-08-21 | Disposition: A | Discharge status: Home / Self Care | Payer: Medicare Other | Attending: Internal Medicine | Admitting: Internal Medicine

## 2016-08-21 ENCOUNTER — Encounter
Admission: EM | Disposition: A | Discharge status: Home / Self Care | Payer: Self-pay | Source: Home / Self Care | Attending: Internal Medicine

## 2016-08-21 HISTORY — PX: DIALYSIS/PERMA CATHETER REMOVAL: CATH118289

## 2016-08-21 HISTORY — PX: LEFT HEART CATH AND CORONARY ANGIOGRAPHY: CATH118249

## 2016-08-21 LAB — RENAL FUNCTION PANEL
Albumin: 3.2 g/dL — ABNORMAL LOW (ref 3.5–5.0)
Anion gap: 10 (ref 5–15)
BUN: 44 mg/dL — AB (ref 6–20)
CALCIUM: 8.5 mg/dL — AB (ref 8.9–10.3)
CO2: 26 mmol/L (ref 22–32)
Chloride: 98 mmol/L — ABNORMAL LOW (ref 101–111)
Creatinine, Ser: 6.41 mg/dL — ABNORMAL HIGH (ref 0.61–1.24)
GFR calc Af Amer: 9 mL/min — ABNORMAL LOW (ref >60)
GFR calc non Af Amer: 7 mL/min — ABNORMAL LOW (ref >60)
GLUCOSE: 300 mg/dL — AB (ref 65–99)
PHOSPHORUS: 5.2 mg/dL — AB (ref 2.5–4.6)
Potassium: 4 mmol/L (ref 3.5–5.1)
SODIUM: 134 mmol/L — AB (ref 135–145)

## 2016-08-21 LAB — GLUCOSE, CAPILLARY
GLUCOSE-CAPILLARY: 240 mg/dL — AB (ref 65–99)
GLUCOSE-CAPILLARY: 223 mg/dL — AB (ref 65–99)
GLUCOSE-CAPILLARY: 150 mg/dL — AB (ref 65–99)

## 2016-08-21 LAB — CBC
HCT: 30.7 % — ABNORMAL LOW (ref 40.0–52.0)
Hemoglobin: 10.6 g/dL — ABNORMAL LOW (ref 13.0–18.0)
MCH: 30.7 pg (ref 26.0–34.0)
MCHC: 34.5 g/dL (ref 32.0–36.0)
MCV: 89 fL (ref 80.0–100.0)
Platelets: 130 10*3/uL — ABNORMAL LOW (ref 150–440)
RBC: 3.45 MIL/uL — ABNORMAL LOW (ref 4.40–5.90)
RDW: 14.5 % (ref 11.5–14.5)
WBC: 6.9 10*3/uL (ref 3.8–10.6)

## 2016-08-21 LAB — TROPONIN I: Troponin I: 1.81 ng/mL (ref <0.03)

## 2016-08-21 SURGERY — DIALYSIS/PERMA CATHETER REMOVAL
Anesthesia: Moderate Sedation

## 2016-08-21 SURGERY — LEFT HEART CATH AND CORONARY ANGIOGRAPHY
Anesthesia: Moderate Sedation

## 2016-08-21 MED ORDER — FENTANYL CITRATE (PF) 100 MCG/2ML IJ SOLN
INTRAMUSCULAR | Status: DC | PRN
  Administered 2016-08-21: 25 ug via INTRAVENOUS

## 2016-08-21 MED ORDER — SODIUM CHLORIDE 0.9% FLUSH
3.0000 mL | Freq: Two times a day (BID) | INTRAVENOUS | Status: DC
  Administered 2016-08-21: 3 mL via INTRAVENOUS

## 2016-08-21 MED ORDER — ONDANSETRON HCL 4 MG/2ML IJ SOLN: 4.0000 mg | Freq: Four times a day (QID) | INTRAMUSCULAR | Status: DC | PRN

## 2016-08-21 MED ORDER — IOPAMIDOL (ISOVUE-300) INJECTION 61%
INTRAVENOUS | Status: DC | PRN
  Administered 2016-08-21: 180 mL via INTRA_ARTERIAL

## 2016-08-21 MED ORDER — SODIUM CHLORIDE 0.9% FLUSH
3.0000 mL | INTRAVENOUS | Status: DC | PRN
  Administered 2016-08-21: 3 mL via INTRAVENOUS
  Filled 2016-08-21: qty 3

## 2016-08-21 MED ORDER — LIDOCAINE-EPINEPHRINE (PF) 2 %-1:200000 IJ SOLN
INTRAMUSCULAR | Status: AC
  Filled 2016-08-21: qty 20

## 2016-08-21 MED ORDER — SODIUM CHLORIDE FLUSH 0.9 % IV SOLN
INTRAVENOUS | Status: AC
  Filled 2016-08-21: qty 10

## 2016-08-21 MED ORDER — MIDAZOLAM HCL 2 MG/2ML IJ SOLN
INTRAMUSCULAR | Status: AC
  Filled 2016-08-21: qty 2

## 2016-08-21 MED ORDER — ACETAMINOPHEN 325 MG PO TABS: 650.0000 mg | ORAL_TABLET | ORAL | Status: DC | PRN

## 2016-08-21 MED ORDER — MIDAZOLAM HCL 2 MG/2ML IJ SOLN
INTRAMUSCULAR | Status: DC | PRN
Start: 2016-08-21 — End: 2016-08-21
  Administered 2016-08-21: 0.5 mg via INTRAVENOUS

## 2016-08-21 MED ORDER — SODIUM CHLORIDE 0.9 % IV SOLN: 250.0000 mL | INTRAVENOUS | Status: DC | PRN

## 2016-08-21 MED ORDER — FENTANYL CITRATE (PF) 100 MCG/2ML IJ SOLN
INTRAMUSCULAR | Status: AC
Start: 2016-08-21 — End: 2016-08-21
  Filled 2016-08-21: qty 2

## 2016-08-21 MED ORDER — HEPARIN (PORCINE) IN NACL 2-0.9 UNIT/ML-% IJ SOLN
INTRAMUSCULAR | Status: AC
  Filled 2016-08-21: qty 500

## 2016-08-21 MED ORDER — LIDOCAINE-EPINEPHRINE (PF) 1 %-1:200000 IJ SOLN
INTRAMUSCULAR | Status: DC | PRN
  Administered 2016-08-21: 20 mL via INTRADERMAL

## 2016-08-21 SURGICAL SUPPLY — 9 items
CATH INFINITI 5 FR 3DRC (CATHETERS) ×3 IMPLANT
CATH INFINITI 5FR ANG PIGTAIL (CATHETERS) ×3 IMPLANT
CATH INFINITI 5FR JL4 (CATHETERS) ×3 IMPLANT
CATH INFINITI JR4 5F (CATHETERS) ×3 IMPLANT
GUIDEWIRE 3MM J TIP .035 145 (WIRE) ×3 IMPLANT
KIT MANI 3VAL PERCEP (MISCELLANEOUS) ×3 IMPLANT
NEEDLE PERC 18GX7CM (NEEDLE) ×3 IMPLANT
PACK CARDIAC CATH (CUSTOM PROCEDURE TRAY) ×3 IMPLANT
SHEATH AVANTI 5FR X 11CM (SHEATH) ×3 IMPLANT

## 2016-08-21 SURGICAL SUPPLY — 4 items
FCP FG STRG 5.5XNS LF DISP (INSTRUMENTS) ×1
FORCEPS FG STRG 5.5XNS LF DISP (INSTRUMENTS) ×1 IMPLANT
FORCEPS KELLY 5.5 STR (INSTRUMENTS) ×2
TRAY LACERAT/PLASTIC (MISCELLANEOUS) ×3 IMPLANT

## 2016-08-21 NOTE — Consult Note (Signed)
[facility] Vascular Consult Note  MRN : [id number]  Calvin Good is a 76 y.o. (12-17-1940) male who presents with chief complaint of  Chief Complaint  Patient presents with  . Chest Pain  .  History of Present Illness: I am asked by Dr. Juleen China to see the patient for Permcath removal. He has a functional fistula and no longer needs his PermCath. He is here with chest pain and is for a cardiac catheterization today to evaluate his coronary disease for possible intervention. He has no fevers or chills. He was originally scheduled to have this removed as an outpatient but as he is admitted, we are consult to remove this while he is here.  Current Facility-Administered Medications  Medication Dose Route Frequency Provider Last Rate Last Dose  . 0.9 %  sodium chloride infusion  250 mL Intravenous PRN Callwood, Dwayne D, MD      . 0.9 %  sodium chloride infusion   Intravenous Continuous Callwood, Dwayne D, MD 10 mL/hr at 08/21/16 [phone]    . [MAR Hold] acetaminophen (TYLENOL) tablet 650 mg  650 mg Oral Q6H PRN Lance Coon, MD       Or  . Doug Sou Hold] acetaminophen (TYLENOL) suppository 650 mg  650 mg Rectal Q6H PRN Lance Coon, MD      . Doug Sou Hold] amLODipine (NORVASC) tablet 10 mg  10 mg Oral QPM Lance Coon, MD   10 mg at 08/20/16 1709  . [MAR Hold] aspirin EC tablet 81 mg  81 mg Oral Daily Lance Coon, MD   81 mg at 08/20/16 1022  . [MAR Hold] atorvastatin (LIPITOR) tablet 40 mg  40 mg Oral Daily Fritzi Mandes, MD   40 mg at 08/20/16 1709  . [MAR Hold] calcium acetate (PHOSLO) capsule 1,334 mg  1,334 mg Oral TID WC Lance Coon, MD   1,334 mg at 08/20/16 1710  . [MAR Hold] carvedilol (COREG) tablet 12.5 mg  12.5 mg Oral BID WC Lance Coon, MD   12.5 mg at 08/21/16 1011  . [MAR Hold] doxazosin (CARDURA) tablet 8 mg  8 mg Oral Daily Lance Coon, MD   8 mg at 08/20/16 1022  . [MAR Hold] furosemide (LASIX) tablet 40 mg  40 mg Oral BID Lance Coon, MD   40 mg at  08/20/16 1710  . [MAR Hold] heparin injection 5,000 Units  5,000 Units Subcutaneous Camelia Phenes Lance Coon, MD   5,000 Units at 08/21/16 [phone]  . [MAR Hold] insulin aspart (novoLOG) injection 0-5 Units  0-5 Units Subcutaneous QHS Lance Coon, MD   2 Units at 08/20/16 2146  . [MAR Hold] insulin aspart (novoLOG) injection 0-9 Units  0-9 Units Subcutaneous TID WC Lance Coon, MD   3 Units at 08/21/16 [phone]  . [MAR Hold] lisinopril (PRINIVIL,ZESTRIL) tablet 40 mg  40 mg Oral QPM Lance Coon, MD   40 mg at 08/20/16 1709  . [MAR Hold] ondansetron (ZOFRAN) tablet 4 mg  4 mg Oral Q6H PRN Lance Coon, MD       Or  . Doug Sou Hold] ondansetron [facility]) injection 4 mg  4 mg Intravenous Q6H PRN Lance Coon, MD      . Doug Sou Hold] oxyCODONE (Oxy IR/ROXICODONE) immediate release tablet 5 mg  5 mg Oral Q4H PRN Lance Coon, MD      . Doug Sou Hold] pantoprazole (PROTONIX) EC tablet 40 mg  40 mg Oral BID Lance Coon, MD   40 mg at 08/20/16 2146  . sodium chloride flush (NS)  0.9 % injection 3 mL  3 mL Intravenous Q12H Callwood, Dwayne D, MD   3 mL at 08/20/16 2157  . sodium chloride flush (NS) 0.9 % injection 3 mL  3 mL Intravenous PRN Callwood, Dwayne D, MD        Past Medical History:  Diagnosis Date  . Cancer [facility])    Prostate  . CHF (congestive heart failure) ([place])   . Chronic kidney disease   . Complication of anesthesia    hard to wake up  . COPD (chronic obstructive pulmonary disease) ([place])   . Diabetes mellitus without complication ([place])   . Difficult intubation   . Dyspnea   . GERD (gastroesophageal reflux disease)   . Hyperlipidemia   . Hypertension     Past Surgical History:  Procedure Laterality Date  . APPENDECTOMY    . AV FISTULA PLACEMENT Left 05/30/2016   Procedure: ARTERIOVENOUS (AV) FISTULA CREATION ( BRACHIOCEPHALIC );  Surgeon: [name], MD;  Location: [facility] ORS;  Service: Vascular;  Laterality: Left;  . CAPD INSERTION N/A 05/30/2016   Procedure: LAPAROSCOPIC INSERTION CONTINUOUS  AMBULATORY PERITONEAL DIALYSIS  (CAPD) CATHETER;  Surgeon: [name], MD;  Location: [facility] ORS;  Service: Vascular;  Laterality: N/A;  . DIALYSIS/PERMA CATHETER INSERTION    . JOINT REPLACEMENT     right knee   . postate removal     . PROSTATE SURGERY    . REMOVAL OF A DIALYSIS CATHETER N/A 08/08/2016   Procedure: REMOVAL OF A DIALYSIS CATHETER;  Surgeon: [name], MD;  Location: [facility] ORS;  Service: Vascular;  Laterality: N/A;    Social History Social History  Substance Use Topics  . Smoking status: Former Smoker    Quit date: 05/21/1982  . Smokeless tobacco: Never Used  . Alcohol use 1.8 oz/week    3 Glasses of wine per week  No IV drug use  Family History Family History  Problem Relation Age of Onset  . Cancer Father   No bleeding disorders, clotting disorders, autoimmune diseases, or aneurysms  Allergies  Allergen Reactions  . Tetracyclines & Related Other (See Comments)    Reaction: unknown  . Morphine Other (See Comments)    Reaction: confusion     REVIEW OF SYSTEMS (Negative unless checked)  Constitutional: [] Weight loss  [] Fever  [] Chills Cardiac: [x] Chest pain   [] Chest pressure   [x] Palpitations   [] Shortness of breath when laying flat   [] Shortness of breath at rest   [x] Shortness of breath with exertion. Vascular:  [] Pain in legs with walking   [] Pain in legs at rest   [] Pain in legs when laying flat   [] Claudication   [] Pain in feet when walking  [] Pain in feet at rest  [] Pain in feet when laying flat   [] History of DVT   [] Phlebitis   [x] Swelling in legs   [] Varicose veins   [] Non-healing ulcers Pulmonary:   [] Uses home oxygen   [x] Productive cough   [] Hemoptysis   [] Wheeze  [] COPD   [] Asthma Neurologic:  [] Dizziness  [] Blackouts   [] Seizures   [] History of stroke   [] History of TIA  [] Aphasia   [] Temporary blindness   [] Dysphagia   [] Weakness or numbness in arms   [] Weakness or numbness in legs Musculoskeletal:  [] Arthritis   [] Joint swelling   [] Joint pain    [] Low back pain Hematologic:  [] Easy bruising  [] Easy bleeding   [] Hypercoagulable state   [] Anemic  [] Hepatitis Gastrointestinal:  [] Blood in stool   [] Vomiting blood  []   Gastroesophageal reflux/heartburn   [] Difficulty swallowing. Genitourinary:  [x] Chronic kidney disease   [] Difficult urination  [] Frequent urination  [] Burning with urination   [] Blood in urine Skin:  [] Rashes   [] Ulcers   [] Wounds Psychological:  [] History of anxiety   []  History of major depression.  Physical Examination  Vitals:   08/20/16 2157 08/21/16 0410 08/21/16 0500 08/21/16 1048  BP:  (!) 151/52  (!) 147/58  Pulse:  73  72  Resp:  19  20  Temp:  98.1 F (36.7 C)  98.1 F (36.7 C)  TempSrc:  Oral  Oral  SpO2:  95%  92%  Weight: 105 kg (231 lb 6.4 oz)  105.6 kg (232 lb 12.8 oz) 105.2 kg (232 lb)  Height:    5\' 9"  (1.753 m)   Body mass index is 34.26 kg/m. Gen:  WD/WN, NAD Head: /AT, No temporalis wasting. Prominent temp pulse not noted. Ear/Nose/Throat: Hearing grossly intact, nares w/o erythema or drainage, oropharynx w/o Erythema/Exudate Eyes: Sclera non-icteric, conjunctiva clear Neck: Trachea midline.  No JVD.  Pulmonary:  Good air movement, respirations not labored, equal bilaterally.  Cardiac: RRR, normal S1, S2. Vascular: Good thrill in AV fistula Vessel Right Left  Radial Palpable Palpable                                   Gastrointestinal: soft, non-tender/non-distended.   Musculoskeletal: M/S 5/5 throughout.  Extremities without ischemic changes.  No deformity or atrophy Neurologic: Sensation grossly intact in extremities.  Symmetrical.  Speech is fluent. Motor exam as listed above. Psychiatric: Judgment intact, Mood & affect appropriate for pt's clinical situation. Dermatologic: No rashes or ulcers noted.  No cellulitis or open wounds.       CBC Lab Results  Component Value Date   WBC 8.9 08/20/2016   HGB 11.4 (L) 08/20/2016   HCT 32.8 (L) 08/20/2016   MCV 89.9  08/20/2016   PLT 152 08/20/2016    BMET    Component Value Date/Time   NA 134 (L) 08/20/2016 0314   K 3.8 08/20/2016 0314   CL 97 (L) 08/20/2016 0314   CO2 28 08/20/2016 0314   GLUCOSE 354 (H) 08/20/2016 0314   BUN 27 (H) 08/20/2016 0314   CREATININE 4.07 (H) 08/20/2016 0314   CALCIUM 8.3 (L) 08/20/2016 0314   GFRNONAA 13 (L) 08/20/2016 0314   GFRAA 15 (L) 08/20/2016 0314   Estimated Creatinine Clearance: 17.9 mL/min (A) (by C-G formula based on SCr of 4.07 mg/dL (H)).  COAG Lab Results  Component Value Date   INR 1.11 08/20/2016   INR 1.00 08/01/2016    Radiology Dg Chest Portable 1 View  Result Date: 08/19/2016 CLINICAL DATA:  Acute onset of midsternal chest pain. Initial encounter. EXAM: PORTABLE CHEST 1 VIEW COMPARISON:  None. FINDINGS: The lungs are well-aerated. Mild bibasilar opacities may reflect mild interstitial edema or atelectasis. There is no evidence of pleural effusion or pneumothorax. The cardiomediastinal silhouette is borderline enlarged. No acute osseous abnormalities are seen. A right-sided dual-lumen catheter is noted ending about the cavoatrial junction. IMPRESSION: Mild bibasilar airspace opacities may reflect mild interstitial edema or atelectasis. Borderline cardiomegaly. Electronically Signed   By: Garald Balding M.D.   On: 08/19/2016 22:50      Assessment/Plan 1. ESRD with functional AVF.  Can remove permcath and asked to do so while he is here today. Was originally scheduled as an outpatient but nephrology would like  to have this removed now which I think is reasonable. Risks and benefits discussed.  2. Hypertension. Stable outpatient medications. 3. Coronary disease. For cardiac catheterization today. 4. Diabetes. Stable outpatient medications and blood glucose control important in reducing the progression of atherosclerotic disease. Also, involved in wound healing. On appropriate medications.    Leotis Pain, MD  08/21/2016 10:53 AM    This  note was created with Dragon medical transcription system.  Any error is purely unintentional

## 2016-08-21 NOTE — Op Note (Signed)
Operative Note     Preoperative diagnosis:   1. ESRD with functional permanent access  Postoperative diagnosis:  1. ESRD with functional permanent access  Procedure:  Removal of right jugular Permcath  Surgeon:  Leotis Pain, MD  Anesthesia:  Local  EBL:  Minimal  Indication for the Procedure:  The patient has a functional permanent dialysis access and no longer needs their permcath.  This can be removed.  Risks and benefits are discussed and informed consent is obtained.  Description of the Procedure:  The patient's right neck, chest and existing catheter were sterilely prepped and draped. The area around the catheter was anesthetized copiously with 1% lidocaine. The catheter was dissected out with curved hemostats until the cuff was freed from the surrounding fibrous sheath. The fiber sheath was transected, and the catheter was then removed in its entirety using gentle traction. Pressure was held and sterile dressings were placed. The patient tolerated the procedure well and was taken to the recovery room in stable condition.     Leotis Pain  08/21/2016, 11:24 AM This note was created with Dragon Medical transcription system. Any errors in dictation are purely unintentional.

## 2016-08-21 NOTE — Plan of Care (Signed)
Problem: Safety: Goal: Ability to remain free from injury will improve Outcome: Progressing Pt up independently, tolerating well.   Problem: Pain Managment: Goal: General experience of comfort will improve Outcome: Progressing No complaints of pain this shift, will continue to monitor.  Problem: Tissue Perfusion: Goal: Risk factors for ineffective tissue perfusion will decrease Outcome: Progressing Heparin subcutaneously for VTE.   Problem: Cardiac: Goal: Ability to achieve and maintain adequate cardiovascular perfusion will improve Outcome: Progressing Pt is scheduled to go for a cardiac cath today.

## 2016-08-21 NOTE — Consult Note (Signed)
Reason for Consult: Unstable angina elevated troponin possible non-STEMI Referring Physician: Fritzi Mandes hospitalist Frazier Richards  Calvin Good is an 76 y.o. male.  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 Patient was admitted found to have elevated troponin so cardiology was recommended for further assessment and evaluation possibly cardiac catheter. Patient denies any significant recent cardiac evaluation. Patient had persistent recurrent moderate chest discomfort over the last several weeks but got particularly worse immediately prior to coming to the emergency room.  Past Medical History:  Diagnosis Date  . Cancer [facility])    Prostate  . CHF (congestive heart failure) ([place])   . Chronic kidney disease   . Complication of anesthesia    hard to wake up  . COPD (chronic obstructive pulmonary disease) ([place])   . Diabetes mellitus without complication ([place])   . Difficult intubation   . Dyspnea   . GERD (gastroesophageal reflux disease)   . Hyperlipidemia   . Hypertension     Past Surgical History:  Procedure Laterality Date  . APPENDECTOMY    . AV FISTULA PLACEMENT Left 05/30/2016   Procedure: ARTERIOVENOUS (AV) FISTULA CREATION ( BRACHIOCEPHALIC );  Surgeon: [name], MD;  Location: [facility] ORS;  Service: Vascular;  Laterality: Left;  . CAPD INSERTION N/A 05/30/2016   Procedure: LAPAROSCOPIC INSERTION CONTINUOUS AMBULATORY PERITONEAL DIALYSIS  (CAPD) CATHETER;  Surgeon: [name], MD;  Location: [facility] ORS;  Service: Vascular;  Laterality: N/A;  . DIALYSIS/PERMA CATHETER INSERTION    . JOINT  REPLACEMENT     right knee   . postate removal     . PROSTATE SURGERY    . REMOVAL OF A DIALYSIS CATHETER N/A 08/08/2016   Procedure: REMOVAL OF A DIALYSIS CATHETER;  Surgeon: [name], MD;  Location: [facility] ORS;  Service: Vascular;  Laterality: N/A;    Family History  Problem Relation Age of Onset  . Cancer Father     Social History:  reports that he quit smoking about 34 years ago. He has never used smokeless tobacco. He reports that he drinks about 1.8 oz of alcohol per week . He reports that he does not use drugs.  Allergies:  Allergies  Allergen Reactions  . Tetracyclines & Related Other (See Comments)    Reaction: unknown  . Morphine Other (See Comments)    Reaction: confusion    Medications: I have reviewed the patient's current medications.  Results for orders placed or performed during the hospital encounter of 08/19/16 (from the past 48 hour(s))  Basic metabolic panel     Status: Abnormal   Collection Time: 08/19/16 10:33 PM  Result Value Ref Range   Sodium 134 (L) 135 - 145 mmol/L   Potassium 4.0 3.5 - 5.1 mmol/L   Chloride 97 (L) 101 - 111 mmol/L   CO2 29 22 - 32 mmol/L   Glucose, Bld 380 (H) 65 - 99 mg/dL   BUN 24 (H) 6 - 20 mg/dL   Creatinine, Ser 3.85 (H) 0.61 - 1.24 mg/dL   Calcium 8.7 (L) 8.9 - 10.3 mg/dL   GFR calc non Af Amer 14 (L) >60 mL/min   GFR calc Af Wyvonnia Lora  16 (L) >60 mL/min    Comment: (NOTE) The eGFR has been calculated using the CKD EPI equation. This calculation has not been validated in all clinical situations. eGFR's persistently <60 mL/min signify possible Chronic Kidney Disease.    Anion gap 8 5 - 15  CBC     Status: Abnormal   Collection Time: 08/19/16 10:33 PM  Result Value Ref Range   WBC 9.4 3.8 - 10.6 K/uL   RBC 3.71 (L) 4.40 - 5.90 MIL/uL   Hemoglobin 11.5 (L) 13.0 - 18.0 g/dL   HCT 33.3 (L) 40.0 - 52.0 %   MCV 89.7 80.0 - 100.0 fL   MCH 31.0 26.0 - 34.0 pg   MCHC 34.6 32.0 - 36.0 g/dL   RDW 14.4 11.5 - 14.5 %    Platelets 154 150 - 440 K/uL  Troponin I     Status: Abnormal   Collection Time: 08/19/16 10:33 PM  Result Value Ref Range   Troponin I 0.11 (HH) <0.03 ng/mL    Comment: CRITICAL RESULT CALLED TO, READ BACK BY AND VERIFIED WITH KAYLIE WALKER RN AT 2320 08/19/16 MSS.   Troponin I     Status: Abnormal   Collection Time: 08/20/16  2:03 AM  Result Value Ref Range   Troponin I 1.81 (HH) <0.03 ng/mL    Comment: CRITICAL VALUE NOTED. VALUE IS CONSISTENT WITH PREVIOUSLY REPORTED/CALLED VALUE.[facility]  Basic metabolic panel     Status: Abnormal   Collection Time: 08/20/16  3:14 AM  Result Value Ref Range   Sodium 134 (L) 135 - 145 mmol/L   Potassium 3.8 3.5 - 5.1 mmol/L   Chloride 97 (L) 101 - 111 mmol/L   CO2 28 22 - 32 mmol/L   Glucose, Bld 354 (H) 65 - 99 mg/dL   BUN 27 (H) 6 - 20 mg/dL   Creatinine, Ser 4.07 (H) 0.61 - 1.24 mg/dL   Calcium 8.3 (L) 8.9 - 10.3 mg/dL   GFR calc non Af Amer 13 (L) >60 mL/min   GFR calc Af Amer 15 (L) >60 mL/min    Comment: (NOTE) The eGFR has been calculated using the CKD EPI equation. This calculation has not been validated in all clinical situations. eGFR's persistently <60 mL/min signify possible Chronic Kidney Disease.    Anion gap 9 5 - 15  CBC     Status: Abnormal   Collection Time: 08/20/16  3:14 AM  Result Value Ref Range   WBC 8.9 3.8 - 10.6 K/uL   RBC 3.65 (L) 4.40 - 5.90 MIL/uL   Hemoglobin 11.4 (L) 13.0 - 18.0 g/dL   HCT 32.8 (L) 40.0 - 52.0 %   MCV 89.9 80.0 - 100.0 fL   MCH 31.2 26.0 - 34.0 pg   MCHC 34.7 32.0 - 36.0 g/dL   RDW 14.4 11.5 - 14.5 %   Platelets 152 150 - 440 K/uL  Troponin I     Status: Abnormal   Collection Time: 08/20/16  3:14 AM  Result Value Ref Range   Troponin I 2.61 (HH) <0.03 ng/mL    Comment: CRITICAL VALUE NOTED. VALUE IS CONSISTENT WITH PREVIOUSLY REPORTED/CALLED VALUE...[facility]  Glucose, capillary     Status: Abnormal   Collection Time: 08/20/16  7:59 AM  Result Value Ref Range   Glucose-Capillary 263 (H) 65 -  99 mg/dL  Troponin I     Status: Abnormal   Collection Time: 08/20/16  8:46 AM  Result Value Ref Range   Troponin I 3.13 (HH) <0.03 ng/mL      Comment: CRITICAL VALUE NOTED. VALUE IS CONSISTENT WITH PREVIOUSLY REPORTED/CALLED VALUE K[facility]  Glucose, capillary     Status: Abnormal   Collection Time: 08/20/16 11:37 AM  Result Value Ref Range   Glucose-Capillary 196 (H) 65 - 99 mg/dL  Troponin I     Status: Abnormal   Collection Time: 08/20/16  3:13 PM  Result Value Ref Range   Troponin I 2.28 (HH) <0.03 ng/mL    Comment: CRITICAL RESULT CALLED TO, READ BACK BY AND VERIFIED WITH SIMONE COURE AT 1552 ON 08/20/2016 JJB   Glucose, capillary     Status: Abnormal   Collection Time: 08/20/16  4:55 PM  Result Value Ref Range   Glucose-Capillary 178 (H) 65 - 99 mg/dL  Glucose, capillary     Status: Abnormal   Collection Time: 08/20/16  9:18 PM  Result Value Ref Range   Glucose-Capillary 228 (H) 65 - 99 mg/dL  Protime-INR     Status: None   Collection Time: 08/20/16 10:29 PM  Result Value Ref Range   Prothrombin Time 14.4 11.4 - 15.2 seconds   INR 1.11   Glucose, capillary     Status: Abnormal   Collection Time: 08/21/16  5:42 AM  Result Value Ref Range   Glucose-Capillary 240 (H) 65 - 99 mg/dL  Glucose, capillary     Status: Abnormal   Collection Time: 08/21/16  7:39 AM  Result Value Ref Range   Glucose-Capillary 223 (H) 65 - 99 mg/dL    Dg Chest Portable 1 View  Result Date: 08/19/2016 CLINICAL DATA:  Acute onset of midsternal chest pain. Initial encounter. EXAM: PORTABLE CHEST 1 VIEW COMPARISON:  None. FINDINGS: The lungs are well-aerated. Mild bibasilar opacities may reflect mild interstitial edema or atelectasis. There is no evidence of pleural effusion or pneumothorax. The cardiomediastinal silhouette is borderline enlarged. No acute osseous abnormalities are seen. A right-sided dual-lumen catheter is noted ending about the cavoatrial junction. IMPRESSION: Mild bibasilar airspace  opacities may reflect mild interstitial edema or atelectasis. Borderline cardiomegaly. Electronically Signed   By: Garald Balding M.D.   On: 08/19/2016 22:50    Review of Systems  Constitutional: Positive for diaphoresis and malaise/fatigue.  HENT: Positive for congestion.   Eyes: Negative.   Respiratory: Positive for shortness of breath.   Cardiovascular: Positive for orthopnea, claudication, leg swelling and PND.  Gastrointestinal: Negative.   Genitourinary: Negative.   Musculoskeletal: Positive for myalgias.  Skin: Negative.   Neurological: Positive for dizziness and weakness.  Endo/Heme/Allergies: Negative.   Psychiatric/Behavioral: Negative.    Blood pressure (!) 151/52, pulse 73, temperature 98.1 F (36.7 C), temperature source Oral, resp. rate 19, height 5' 9" (1.753 m), weight 105.6 kg (232 lb 12.8 oz), SpO2 95 %. Physical Exam  Nursing note and vitals reviewed. Constitutional: He is oriented to person, place, and time. He appears well-developed and well-nourished.  HENT:  Head: Normocephalic and atraumatic.  Eyes: Conjunctivae and EOM are normal. Pupils are equal, round, and reactive to light.  Neck: Normal range of motion. Neck supple.  Cardiovascular: Normal rate and regular rhythm.   Murmur heard. Respiratory: Effort normal and breath sounds normal.  GI: Soft. Bowel sounds are normal.  Musculoskeletal: Normal range of motion. He exhibits edema.  Neurological: He is alert and oriented to person, place, and time. He has normal reflexes.  Skin: Skin is warm and dry.  Psychiatric: He has a normal mood and affect.    Assessment/Plan: Unstable angina End-stage renal disease on dialysis Hypertension Congestive heart failure COPD  Diabetes Elevated troponin GERD Hyperlipidemia Former smoker . Plan Agree with admission to telemetry rule out for myocardial infarction Continue to follow-up EKGs and troponins Echocardiogram may be helpful for assessment of left  ventricular function wall motion Continue dialysis therapy for renal insufficiency Elevated troponin may be related to end-stage renal disease but cannot completely rule out minor non-STEMI versus demand ischemia Continue hypertension control amlodipine Coreg and Prinivil Continue omeprazole for reflux symptoms Diabetes management control with Humalog insulin Continue diuretic therapy Consider cardiac catheter for what appears to be unstable anginal symptoms possible non-STEMI with elevated troponin    D  08/21/2016, 8:10 AM     

## 2016-08-21 NOTE — Progress Notes (Signed)
[facility]  ROUNDING NOTE   Subjective:   Son at bedside.  Cardiac catheterization for today.  Also will have permcath removed.   Objective:  Vital signs in last 24 hours:  Temp:  [97.8 F (36.6 C)-98.1 F (36.7 C)] 98.1 F (36.7 C) (05/23 1048) Pulse Rate:  [71-79] 73 (05/23 1430) Resp:  [14-20] 18 (05/23 1430) BP: (147-167)/(48-63) 154/58 (05/23 1430) SpO2:  [81 %-97 %] 96 % (05/23 1430) Weight:  [105 kg (231 lb 6.4 oz)-105.6 kg (232 lb 12.8 oz)] 105.2 kg (232 lb) (05/23 1048)  Weight change: -1.633 kg (-3 lb 9.6 oz) Filed Weights   08/20/16 2157 08/21/16 0500 08/21/16 1048  Weight: 105 kg (231 lb 6.4 oz) 105.6 kg (232 lb 12.8 oz) 105.2 kg (232 lb)    Intake/Output: I/O last 3 completed shifts: In: 65 [P.O.:600; I.V.:3] Out: 200 [Urine:200]   Intake/Output this shift:  No intake/output data recorded.  Physical Exam: General: NAD, sitting up in bed  Head: Normocephalic, atraumatic. Moist oral mucosal membranes  Eyes: Anicteric, PERRL  Neck: Supple, trachea midline  Lungs:  Clear to auscultation  Heart: Regular rate and rhythm  Abdomen:  Soft, nontender,   Extremities: no peripheral edema.  Neurologic: Nonfocal, moving all four extremities  Skin: No lesions  Access: Right AVF, RIJ permcath    Basic Metabolic Panel:  Recent Labs Lab 08/19/16 2233 08/20/16 0314  NA 134* 134*  K 4.0 3.8  CL 97* 97*  CO2 29 28  GLUCOSE 380* 354*  BUN 24* 27*  CREATININE 3.85* 4.07*  CALCIUM 8.7* 8.3*    Liver Function Tests: No results for input(s): AST, ALT, ALKPHOS, BILITOT, PROT, ALBUMIN in the last 168 hours. No results for input(s): LIPASE, AMYLASE in the last 168 hours. No results for input(s): AMMONIA in the last 168 hours.  CBC:  Recent Labs Lab 08/19/16 2233 08/20/16 0314  WBC 9.4 8.9  HGB 11.5* 11.4*  HCT 33.3* 32.8*  MCV 89.7 89.9  PLT 154 152    Cardiac Enzymes:  Recent Labs Lab 08/19/16 2233 08/20/16 0203 08/20/16 0314  08/20/16 0846 08/20/16 1513  TROPONINI 0.11* 1.81* 2.61* 3.13* 2.28*    BNP: Invalid input(s): POCBNP  CBG:  Recent Labs Lab 08/20/16 1137 08/20/16 1655 08/20/16 2118 08/21/16 0542 08/21/16 0739  GLUCAP 196* 178* 228* 240* 223*    Microbiology: Results for orders placed or performed during the hospital encounter of 08/06/16  Surgical pcr screen     Status: None   Collection Time: 08/06/16 11:53 AM  Result Value Ref Range Status   MRSA, PCR NEGATIVE NEGATIVE Final   Staphylococcus aureus NEGATIVE NEGATIVE Final    Comment:        The Xpert SA Assay (FDA approved for NASAL specimens in patients over 84 years of age), is one component of a comprehensive surveillance program.  Test performance has been validated by [facility] for patients greater than or equal to 24 year old. It is not intended to diagnose infection nor to guide or monitor treatment.     Coagulation Studies:  Recent Labs  08/20/16 2229  LABPROT 14.4  INR 1.11    Urinalysis: No results for input(s): COLORURINE, LABSPEC, PHURINE, GLUCOSEU, HGBUR, BILIRUBINUR, KETONESUR, PROTEINUR, UROBILINOGEN, NITRITE, LEUKOCYTESUR in the last 72 hours.  Invalid input(s): APPERANCEUR    Imaging: Dg Chest Portable 1 View  Result Date: 08/19/2016 CLINICAL DATA:  Acute onset of midsternal chest pain. Initial encounter. EXAM: PORTABLE CHEST 1 VIEW COMPARISON:  None. FINDINGS:  The lungs are well-aerated. Mild bibasilar opacities may reflect mild interstitial edema or atelectasis. There is no evidence of pleural effusion or pneumothorax. The cardiomediastinal silhouette is borderline enlarged. No acute osseous abnormalities are seen. A right-sided dual-lumen catheter is noted ending about the cavoatrial junction. IMPRESSION: Mild bibasilar airspace opacities may reflect mild interstitial edema or atelectasis. Borderline cardiomegaly. Electronically Signed   By: Garald Balding M.D.   On: 08/19/2016 22:50      Medications:   . sodium chloride    . sodium chloride 10 mL/hr at 08/21/16 0635  . sodium chloride     . [MAR Hold] amLODipine  10 mg Oral QPM  . [MAR Hold] aspirin EC  81 mg Oral Daily  . [MAR Hold] atorvastatin  40 mg Oral Daily  . [MAR Hold] calcium acetate  1,334 mg Oral TID WC  . [MAR Hold] carvedilol  12.5 mg Oral BID WC  . [MAR Hold] doxazosin  8 mg Oral Daily  . [MAR Hold] furosemide  40 mg Oral BID  . [MAR Hold] heparin  5,000 Units Subcutaneous Q8H  . [MAR Hold] insulin aspart  0-5 Units Subcutaneous QHS  . [MAR Hold] insulin aspart  0-9 Units Subcutaneous TID WC  . [MAR Hold] lisinopril  40 mg Oral QPM  . [MAR Hold] pantoprazole  40 mg Oral BID  . sodium chloride flush  3 mL Intravenous Q12H  . sodium chloride flush  3 mL Intravenous Q12H  . sodium chloride flush       sodium chloride, sodium chloride, [MAR Hold] acetaminophen **OR** [MAR Hold] acetaminophen, acetaminophen, [MAR Hold] ondansetron **OR** [MAR Hold] ondansetron (ZOFRAN) IV, ondansetron (ZOFRAN) IV, [MAR Hold] oxyCODONE, sodium chloride flush, sodium chloride flush  Assessment/ Plan:  [name] is a 76 y.o. white male with diabetes mellitus type II insulin dependent, hypertension, hyperlipidemia, prostate cancer status post prostatectomy, right total knee, appendectomy.   MWF [facility] [place] AVF  1. End Stage Renal Disease MWF: Dialysis for later today. Will see if vascular may remove permcath today.   2. Hypertension: blood pressure at goal.  - carvedilol, amlodipine, lisinopril, doxazosin  3. Anemia of chronic kidney disease: hemoglobin 11.4. Mircera as outpatient.   4. Secondary Hyperparathyroidism: outpatient PTH 263 on 5/18.  - calcium acetate    LOS: 1 Calvin Good 5/23/20183:23 PM

## 2016-08-22 ENCOUNTER — Encounter: Payer: Self-pay | Admitting: Vascular Surgery

## 2016-08-22 LAB — GLUCOSE, CAPILLARY: Glucose-Capillary: 223 mg/dL — ABNORMAL HIGH (ref 65–99)

## 2016-08-22 LAB — CARDIAC CATHETERIZATION: CATHEFQUANT: 50 %

## 2016-08-22 LAB — ECHOCARDIOGRAM COMPLETE
Height: 69 in
Weight: 3668.45 oz

## 2016-08-22 LAB — HEPATITIS B SURFACE ANTIGEN: Hepatitis B Surface Ag: NEGATIVE

## 2016-08-22 MED ORDER — ATORVASTATIN CALCIUM 20 MG PO TABS: 40.0000 mg | ORAL_TABLET | Freq: Every day | ORAL | 0 refills | Status: DC

## 2016-08-22 MED ORDER — ISOSORBIDE MONONITRATE ER 30 MG PO TB24: 30.0000 mg | ORAL_TABLET | Freq: Every day | ORAL | 0 refills | Status: DC

## 2016-08-22 NOTE — Discharge Summary (Signed)
[facility] [name] at [facility], 76 y.o., DOB [birth year], MRN [id number]. 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 male who presents with Chest heaviness. Patient is dialysis patient, and states that he had transient episodes of chest heaviness the past, but these always improved with rest. Tonight the sensation would not go away. He came to the ED for evaluation. Patient's troponin subsequently went up into a MI range. Patient was seen by cardiology and underwent a cardiac catheterization. Cardiac catheter showed chronically occluded RCA with collaterals likely the culprit. Per cardiology medical management was recommended. They recommended continuing his aspirin therapy cholesterol lowering regimen as well as start him on Imdur. Patient also had a PermCath that was removed during the hospitalization. His fistula is being used to dialyze him.           Consults  cardiology, nephrology  Significant Tests:  See full reports for all details    Dg Chest Portable 1 View  Result Date: 08/19/2016 CLINICAL DATA:  Acute onset of midsternal chest pain. Initial encounter. EXAM: PORTABLE CHEST 1 VIEW COMPARISON:  None. FINDINGS: The lungs are well-aerated. Mild bibasilar opacities may reflect mild interstitial edema or atelectasis. There is no evidence of pleural effusion or pneumothorax. The cardiomediastinal silhouette is borderline enlarged.  No acute osseous abnormalities are seen. A right-sided dual-lumen catheter is noted ending about the cavoatrial junction. IMPRESSION: Mild bibasilar airspace opacities may reflect mild interstitial edema or atelectasis. Borderline cardiomegaly. Electronically Signed   By: Garald Balding M.D.   On: 08/19/2016 22:50       Today   Subjective:   Calvin Good  patient feeling better wants to go home  Objective:   Blood pressure (!) 151/45, pulse 80, temperature 98.3 F (36.8 C), temperature source Oral, resp. rate 18, height 5\' 9"  (1.753 m), weight 227 lb 3.2 oz (103.1 kg), SpO2 94 %.  .  Intake/Output Summary (Last 24 hours) at 08/22/16 1255 Last data filed at 08/22/16 0910  Gross per 24 hour  Intake              240 ml  Output             1500 ml  Net            -1260 ml    Exam VITAL SIGNS: Blood pressure (!) 151/45, pulse 80, temperature 98.3 F (36.8 C), temperature source Oral, resp. rate 18, height 5\' 9"  (1.753 m), weight 227 lb 3.2 oz (103.1 kg), SpO2 94 %.  GENERAL:  76 y.o.-year-old patient lying in the bed with no acute distress.  EYES: Pupils equal, round, reactive to light and accommodation. No scleral icterus. Extraocular muscles intact.  HEENT: Head atraumatic, normocephalic. Oropharynx and nasopharynx clear.  NECK:  Supple, no jugular venous distention. No thyroid enlargement, no tenderness.  LUNGS: Normal breath sounds bilaterally, no wheezing, rales,rhonchi or crepitation. No use of accessory muscles of respiration.  CARDIOVASCULAR: S1, S2 normal. No  murmurs, rubs, or gallops.  ABDOMEN: Soft, nontender, nondistended. Bowel sounds present. No organomegaly or mass.  EXTREMITIES: No pedal edema, cyanosis, or clubbing.  NEUROLOGIC: Cranial nerves II through XII are intact. Muscle strength 5/5 in all extremities. Sensation intact. Gait not checked.  PSYCHIATRIC: The patient is alert and oriented x 3.  SKIN: No obvious rash, lesion, or ulcer.   Data Review     CBC w  Diff: Lab Results  Component Value Date   WBC 6.9 08/21/2016   HGB 10.6 (L) 08/21/2016   HCT 30.7 (L) 08/21/2016   PLT 130 (L) 08/21/2016   LYMPHOPCT 23 08/01/2016   MONOPCT 9 08/01/2016   EOSPCT 5 08/01/2016   BASOPCT 0 08/01/2016   CMP: Lab Results  Component Value Date   NA 134 (L) 08/21/2016   K 4.0 08/21/2016   CL 98 (L) 08/21/2016   CO2 26 08/21/2016   BUN 44 (H) 08/21/2016   CREATININE 6.41 (H) 08/21/2016   ALBUMIN 3.2 (L) 08/21/2016  .  Micro Results No results found for this or any previous visit (from the past 240 hour(s)).      Code Status Orders        Start     Ordered   08/20/16 0306  Full code  Continuous     08/20/16 0305    Code Status History    Date Active Date Inactive Code Status Order ID Comments User Context   This patient has a current code status but no historical code status.    Advance Directive Documentation     Most Recent Value  Type of Advance Directive  Healthcare Power of Attorney  Pre-existing out of facility DNR order (yellow form or pink MOST form)  -  "MOST" Form in Place?  -          Follow-up Information    [name], MD. Go on 08/28/2016.   Specialty:  Internal Medicine Why:  Time: 8:15 a.m. Contact information: [place] [address] [phone]        [name], MD. Go on 09/05/2016.   Specialties:  Cardiology, Internal Medicine Why:  Time: 11:15 a.m. Contact information: [place] [address] [phone]           Discharge Medications   Allergies as of 08/22/2016      Reactions   Tetracyclines & Related Other (See Comments)   Reaction: unknown   Morphine Other (See Comments)   Reaction: confusion      Medication List    TAKE these medications   amLODipine 10 MG tablet Commonly known as:  NORVASC Take 10 mg by mouth every evening.   aspirin 81 MG EC tablet Chew 81  mg by mouth daily.   atorvastatin 20 MG tablet Commonly known as:  LIPITOR Take 2 tablets (40 mg total) by mouth daily. What changed:  how much to take   calcium acetate 667 MG capsule Commonly known as:  PHOSLO Take 1,334 mg by mouth 3 (three) times daily with meals.   carvedilol 25 MG tablet Commonly known as:  COREG Take 12.5 mg by mouth 2 (two) times daily with a meal.   doxazosin 8 MG tablet Commonly known as:  CARDURA Take 8 mg by mouth daily.   furosemide 40 MG tablet Commonly known as:  LASIX Take 40 mg by mouth 2 (two) times daily.   indomethacin 50 MG  capsule Commonly known as:  INDOCIN Take 50 mg by mouth 2 (two) times daily as needed (gout).   insulin lispro 100 UNIT/ML injection Commonly known as:  HUMALOG Inject 10 Units into the skin 3 (three) times daily before meals.   insulin NPH Human 100 UNIT/ML injection Commonly known as:  HUMULIN N,NOVOLIN N Inject 60 Units into the skin at bedtime.   isosorbide mononitrate 30 MG 24 hr tablet Commonly known as:  IMDUR Take 1 tablet (30 mg total) by mouth daily.   lisinopril 40 MG tablet Commonly known as:  PRINIVIL,ZESTRIL Take 40 mg by mouth every evening.   omeprazole 20 MG capsule Commonly known as:  PRILOSEC Take 20 mg by mouth 2 (two) times daily before a meal.          Total Time in preparing paper work, data evaluation and todays exam - 35 minutes  Dustin Flock M.D on 08/22/2016 at 12:55 PM  [facility]   Office  [phone]

## 2016-08-22 NOTE — Progress Notes (Signed)
Calvin Good at [place] NAME: Calvin Good    MR#:  [id number]  DATE OF BIRTH:  29-Nov-1940  SUBJECTIVE:   Pt awating cath has no complaints today REVIEW OF SYSTEMS:   Review of Systems  Constitutional: Negative for chills, fever and weight loss.  HENT: Negative for ear discharge, ear pain and nosebleeds.   Eyes: Negative for blurred vision, pain and discharge.  Respiratory: Negative for sputum production, shortness of breath, wheezing and stridor.   Cardiovascular: Negative for chest pain, palpitations, orthopnea and PND.  Gastrointestinal: Negative for abdominal pain, diarrhea, nausea and vomiting.  Genitourinary: Negative for frequency and urgency.  Musculoskeletal: Negative for back pain and joint pain.  Neurological: Negative for sensory change, speech change, focal weakness and weakness.  Psychiatric/Behavioral: Negative for depression and hallucinations. The patient is not nervous/anxious.    Tolerating Diet:npo Tolerating PT: pending  DRUG ALLERGIES:   Allergies  Allergen Reactions  . Tetracyclines & Related Other (See Comments)    Reaction: unknown  . Morphine Other (See Comments)    Reaction: confusion    VITALS:  Blood pressure (!) 151/45, pulse 80, temperature 98.3 F (36.8 C), temperature source Oral, resp. rate 18, height 5\' 9"  (1.753 m), weight 227 lb 3.2 oz (103.1 kg), SpO2 94 %.  PHYSICAL EXAMINATION:   Physical Exam  GENERAL:  76 y.o.-year-old patient lying in the bed with no acute distress.  EYES: Pupils equal, round, reactive to light and accommodation. No scleral icterus. Extraocular muscles intact.  HEENT: Head atraumatic, normocephalic. Oropharynx and nasopharynx clear.  NECK:  Supple, no jugular venous distention. No thyroid enlargement, no tenderness.  LUNGS: Normal breath sounds bilaterally, no wheezing, rales, rhonchi. No use of accessory muscles of respiration.  CARDIOVASCULAR: S1, S2 normal. No  murmurs, rubs, or gallops.  ABDOMEN: Soft, nontender, nondistended. Bowel sounds present. No organomegaly or mass.  EXTREMITIES: No cyanosis, clubbing or edema b/l.    NEUROLOGIC: Cranial nerves II through XII are intact. No focal Motor or sensory deficits b/l.   PSYCHIATRIC:  patient is alert and oriented x 3.  SKIN: No obvious rash, lesion, or ulcer.   LABORATORY PANEL:  CBC  Recent Labs Lab 08/21/16 0830  WBC 6.9  HGB 10.6*  HCT 30.7*  PLT 130*    Chemistries   Recent Labs Lab 08/21/16 1610  NA 134*  K 4.0  CL 98*  CO2 26  GLUCOSE 300*  BUN 44*  CREATININE 6.41*  CALCIUM 8.5*   Cardiac Enzymes  Recent Labs Lab 08/20/16 1513  TROPONINI 2.28*   RADIOLOGY:  No results found. ASSESSMENT AND PLAN:  Calvin Good  is a 76 y.o. male who presents with Chest heaviness. Patient is dialysis patient, and states that he had transient episodes of chest heaviness the past, but these always improved with rest. Tonight the sensation would not go away. He came to the ED for evaluation. Here his troponin was found to be mildly elevated at 0.11  1. Non-Q wave MI  cariac cath later today  2.ESRD on dialysis [facility]) - nephrology consult for dialysis support he will have hd today  3.Essential hypertension, benign - stable, continue home meds  4.Diabetes ([place]) - sliding scale insulin with corresponding glucose checks  5.Hyperlipemia - continue home meds  6.GERD (gastroesophageal reflux disease) - home dose PPI  Case discussed with Care Management/Social Worker. Management plans discussed with the patient, family and they are in agreement.  CODE STATUS: FULL  DVT Prophylaxis:  heparin  TOTAL TIME TAKING CARE OF THIS PATIENT: 30 minutes.  >50% time spent on counselling and coordination of care  POSSIBLE D/C IN 1-2 DAYS, DEPENDING ON CLINICAL CONDITION.  Note: This dictation was prepared with Dragon dictation along with smaller phrase technology. Any transcriptional errors  that result from this process are unintentional.  Dustin Flock M.D on 08/22/2016 at 7:57 AM  Between 7am to 6pm - Pager - [phone]  After 6pm go to [email or web address] - password [email or web address] [facility]  Office  [phone]  CC: Primary care physician; [name], MD

## 2016-08-22 NOTE — Progress Notes (Signed)
Pt oxygen saturation is 91% at rest on room air. Pt ambulated around the nurses station, oxygen saturation is 93-94 with exertion. I will continue to assess.

## 2016-08-22 NOTE — Progress Notes (Signed)
Pt is impatient, waiting on MD to round. MD notified via text that patient is threatening to walk out after breakfast. I will continue to assess.

## 2016-08-23 ENCOUNTER — Encounter: Payer: Self-pay | Admitting: Internal Medicine

## 2016-08-26 ENCOUNTER — Encounter: Payer: Self-pay | Admitting: Internal Medicine

## 2016-08-26 ENCOUNTER — Inpatient Hospital Stay
Admission: EM | Admit: 2016-08-26 | Discharge: 2016-08-28 | DRG: 299 | Disposition: A | Discharge status: Home Health | Payer: Medicare Other | Attending: Internal Medicine | Admitting: Internal Medicine

## 2016-08-26 ENCOUNTER — Emergency Department: Payer: Medicare Other

## 2016-08-26 DIAGNOSIS — R229 Localized swelling, mass and lump, unspecified: Secondary | ICD-10-CM | POA: Diagnosis not present

## 2016-08-26 DIAGNOSIS — K219 Gastro-esophageal reflux disease without esophagitis: Secondary | ICD-10-CM | POA: Diagnosis present

## 2016-08-26 DIAGNOSIS — I724 Aneurysm of artery of lower extremity: Secondary | ICD-10-CM

## 2016-08-26 DIAGNOSIS — I729 Aneurysm of unspecified site: Secondary | ICD-10-CM | POA: Diagnosis not present

## 2016-08-26 DIAGNOSIS — Z96651 Presence of right artificial knee joint: Secondary | ICD-10-CM | POA: Diagnosis present

## 2016-08-26 DIAGNOSIS — I9789 Other postprocedural complications and disorders of the circulatory system, not elsewhere classified: Secondary | ICD-10-CM

## 2016-08-26 DIAGNOSIS — I252 Old myocardial infarction: Secondary | ICD-10-CM

## 2016-08-26 DIAGNOSIS — R0902 Hypoxemia: Secondary | ICD-10-CM | POA: Diagnosis present

## 2016-08-26 DIAGNOSIS — Z7982 Long term (current) use of aspirin: Secondary | ICD-10-CM

## 2016-08-26 DIAGNOSIS — Z881 Allergy status to other antibiotic agents status: Secondary | ICD-10-CM

## 2016-08-26 DIAGNOSIS — N186 End stage renal disease: Secondary | ICD-10-CM | POA: Diagnosis present

## 2016-08-26 DIAGNOSIS — I509 Heart failure, unspecified: Secondary | ICD-10-CM | POA: Diagnosis present

## 2016-08-26 DIAGNOSIS — E1122 Type 2 diabetes mellitus with diabetic chronic kidney disease: Secondary | ICD-10-CM | POA: Diagnosis present

## 2016-08-26 DIAGNOSIS — I132 Hypertensive heart and chronic kidney disease with heart failure and with stage 5 chronic kidney disease, or end stage renal disease: Secondary | ICD-10-CM | POA: Diagnosis present

## 2016-08-26 DIAGNOSIS — I2510 Atherosclerotic heart disease of native coronary artery without angina pectoris: Secondary | ICD-10-CM | POA: Diagnosis present

## 2016-08-26 DIAGNOSIS — J449 Chronic obstructive pulmonary disease, unspecified: Secondary | ICD-10-CM | POA: Diagnosis present

## 2016-08-26 DIAGNOSIS — Z794 Long term (current) use of insulin: Secondary | ICD-10-CM

## 2016-08-26 DIAGNOSIS — Z992 Dependence on renal dialysis: Secondary | ICD-10-CM

## 2016-08-26 DIAGNOSIS — D631 Anemia in chronic kidney disease: Secondary | ICD-10-CM | POA: Diagnosis present

## 2016-08-26 DIAGNOSIS — Z8546 Personal history of malignant neoplasm of prostate: Secondary | ICD-10-CM

## 2016-08-26 DIAGNOSIS — T81718A Complication of other artery following a procedure, not elsewhere classified, initial encounter: Secondary | ICD-10-CM

## 2016-08-26 DIAGNOSIS — Z809 Family history of malignant neoplasm, unspecified: Secondary | ICD-10-CM

## 2016-08-26 DIAGNOSIS — Z885 Allergy status to narcotic agent status: Secondary | ICD-10-CM

## 2016-08-26 DIAGNOSIS — E785 Hyperlipidemia, unspecified: Secondary | ICD-10-CM | POA: Diagnosis present

## 2016-08-26 DIAGNOSIS — S301XXA Contusion of abdominal wall, initial encounter: Secondary | ICD-10-CM

## 2016-08-26 DIAGNOSIS — R1909 Other intra-abdominal and pelvic swelling, mass and lump: Secondary | ICD-10-CM

## 2016-08-26 DIAGNOSIS — Z87891 Personal history of nicotine dependence: Secondary | ICD-10-CM

## 2016-08-26 DIAGNOSIS — N2581 Secondary hyperparathyroidism of renal origin: Secondary | ICD-10-CM | POA: Diagnosis present

## 2016-08-26 LAB — CBC WITH DIFFERENTIAL/PLATELET
BASOS ABS: 0.1 10*3/uL (ref 0–0.1)
BASOS PCT: 1 %
Eosinophils Absolute: 0.4 10*3/uL (ref 0–0.7)
Eosinophils Relative: 4 %
HEMATOCRIT: 29.7 % — AB (ref 40.0–52.0)
Hemoglobin: 10.1 g/dL — ABNORMAL LOW (ref 13.0–18.0)
LYMPHS PCT: 21 %
Lymphs Abs: 2.1 10*3/uL (ref 1.0–3.6)
MCH: 30.2 pg (ref 26.0–34.0)
MCHC: 34.1 g/dL (ref 32.0–36.0)
MCV: 88.6 fL (ref 80.0–100.0)
Monocytes Absolute: 0.7 10*3/uL (ref 0.2–1.0)
Monocytes Relative: 7 %
NEUTROS ABS: 6.6 10*3/uL — AB (ref 1.4–6.5)
NEUTROS PCT: 67 %
Platelets: 146 10*3/uL — ABNORMAL LOW (ref 150–440)
RBC: 3.36 MIL/uL — AB (ref 4.40–5.90)
RDW: 14.4 % (ref 11.5–14.5)
WBC: 9.9 10*3/uL (ref 3.8–10.6)

## 2016-08-26 LAB — COMPREHENSIVE METABOLIC PANEL
ALBUMIN: 3.5 g/dL (ref 3.5–5.0)
ALT: 23 U/L (ref 17–63)
AST: 29 U/L (ref 15–41)
Alkaline Phosphatase: 64 U/L (ref 38–126)
Anion gap: 14 (ref 5–15)
BILIRUBIN TOTAL: 0.7 mg/dL (ref 0.3–1.2)
BUN: 53 mg/dL — AB (ref 6–20)
CHLORIDE: 96 mmol/L — AB (ref 101–111)
CO2: 26 mmol/L (ref 22–32)
CREATININE: 6.72 mg/dL — AB (ref 0.61–1.24)
Calcium: 8.5 mg/dL — ABNORMAL LOW (ref 8.9–10.3)
GFR calc Af Amer: 8 mL/min — ABNORMAL LOW (ref >60)
GFR, EST NON AFRICAN AMERICAN: 7 mL/min — AB (ref >60)
GLUCOSE: 254 mg/dL — AB (ref 65–99)
Potassium: 3.9 mmol/L (ref 3.5–5.1)
Sodium: 136 mmol/L (ref 135–145)
Total Protein: 7 g/dL (ref 6.5–8.1)

## 2016-08-26 LAB — CBC
HCT: 28.2 % — ABNORMAL LOW (ref 40.0–52.0)
Hemoglobin: 9.6 g/dL — ABNORMAL LOW (ref 13.0–18.0)
MCH: 30.7 pg (ref 26.0–34.0)
MCHC: 34.1 g/dL (ref 32.0–36.0)
MCV: 90 fL (ref 80.0–100.0)
PLATELETS: 121 10*3/uL — AB (ref 150–440)
RBC: 3.14 MIL/uL — AB (ref 4.40–5.90)
RDW: 14.7 % — ABNORMAL HIGH (ref 11.5–14.5)
WBC: 8 10*3/uL (ref 3.8–10.6)

## 2016-08-26 LAB — BASIC METABOLIC PANEL
Anion gap: 10 (ref 5–15)
BUN: 58 mg/dL — ABNORMAL HIGH (ref 6–20)
CALCIUM: 8.4 mg/dL — AB (ref 8.9–10.3)
CO2: 28 mmol/L (ref 22–32)
CREATININE: 6.92 mg/dL — AB (ref 0.61–1.24)
Chloride: 100 mmol/L — ABNORMAL LOW (ref 101–111)
GFR calc non Af Amer: 7 mL/min — ABNORMAL LOW (ref >60)
GFR, EST AFRICAN AMERICAN: 8 mL/min — AB (ref >60)
GLUCOSE: 243 mg/dL — AB (ref 65–99)
Potassium: 4 mmol/L (ref 3.5–5.1)
Sodium: 138 mmol/L (ref 135–145)

## 2016-08-26 LAB — APTT: aPTT: 32 seconds (ref 24–36)

## 2016-08-26 LAB — PROTIME-INR
INR: 1.06
PROTHROMBIN TIME: 13.8 s (ref 11.4–15.2)

## 2016-08-26 LAB — GLUCOSE, CAPILLARY
GLUCOSE-CAPILLARY: 278 mg/dL — AB (ref 65–99)
Glucose-Capillary: 346 mg/dL — ABNORMAL HIGH (ref 65–99)
Glucose-Capillary: 267 mg/dL — ABNORMAL HIGH (ref 65–99)

## 2016-08-26 MED ORDER — DOXAZOSIN MESYLATE 4 MG PO TABS
8.0000 mg | ORAL_TABLET | Freq: Every day | ORAL | Status: DC
  Administered 2016-08-26 – 2016-08-28 (×3): 8 mg via ORAL
  Filled 2016-08-26 (×3): qty 2

## 2016-08-26 MED ORDER — SODIUM CHLORIDE 0.9% FLUSH
3.0000 mL | Freq: Two times a day (BID) | INTRAVENOUS | Status: DC
  Administered 2016-08-26 – 2016-08-28 (×4): 3 mL via INTRAVENOUS

## 2016-08-26 MED ORDER — ISOSORBIDE MONONITRATE ER 30 MG PO TB24
30.0000 mg | ORAL_TABLET | Freq: Every day | ORAL | Status: DC
  Administered 2016-08-26 – 2016-08-27 (×2): 30 mg via ORAL
  Filled 2016-08-26 (×2): qty 1

## 2016-08-26 MED ORDER — SENNOSIDES-DOCUSATE SODIUM 8.6-50 MG PO TABS: 1.0000 | ORAL_TABLET | Freq: Every evening | ORAL | Status: DC | PRN

## 2016-08-26 MED ORDER — ATORVASTATIN CALCIUM 20 MG PO TABS
40.0000 mg | ORAL_TABLET | Freq: Every day | ORAL | Status: DC
  Administered 2016-08-26 – 2016-08-28 (×3): 40 mg via ORAL
  Filled 2016-08-26 (×3): qty 2

## 2016-08-26 MED ORDER — INSULIN DETEMIR 100 UNIT/ML ~~LOC~~ SOLN
60.0000 [IU] | Freq: Every day | SUBCUTANEOUS | Status: DC
  Administered 2016-08-26: 60 [IU] via SUBCUTANEOUS
  Filled 2016-08-26 (×3): qty 0.6

## 2016-08-26 MED ORDER — CALCIUM ACETATE (PHOS BINDER) 667 MG PO CAPS
1334.0000 mg | ORAL_CAPSULE | Freq: Three times a day (TID) | ORAL | Status: DC
  Administered 2016-08-26 – 2016-08-28 (×6): 1334 mg via ORAL
  Filled 2016-08-26 (×6): qty 2

## 2016-08-26 MED ORDER — ONDANSETRON HCL 4 MG PO TABS: 4.0000 mg | ORAL_TABLET | Freq: Four times a day (QID) | ORAL | Status: DC | PRN

## 2016-08-26 MED ORDER — SODIUM CHLORIDE 0.9% FLUSH: 3.0000 mL | INTRAVENOUS | Status: DC | PRN

## 2016-08-26 MED ORDER — SODIUM CHLORIDE 0.9 % IV SOLN: 250.0000 mL | INTRAVENOUS | Status: DC | PRN

## 2016-08-26 MED ORDER — ACETAMINOPHEN 650 MG RE SUPP: 650.0000 mg | Freq: Four times a day (QID) | RECTAL | Status: DC | PRN

## 2016-08-26 MED ORDER — AMLODIPINE BESYLATE 10 MG PO TABS
10.0000 mg | ORAL_TABLET | Freq: Every evening | ORAL | Status: DC
  Administered 2016-08-26 – 2016-08-27 (×2): 10 mg via ORAL
  Filled 2016-08-26 (×2): qty 1

## 2016-08-26 MED ORDER — INSULIN ASPART 100 UNIT/ML ~~LOC~~ SOLN
0.0000 [IU] | Freq: Three times a day (TID) | SUBCUTANEOUS | Status: DC
  Administered 2016-08-26: 7 [IU] via SUBCUTANEOUS
  Administered 2016-08-27 (×2): 1 [IU] via SUBCUTANEOUS
  Administered 2016-08-27 – 2016-08-28 (×2): 2 [IU] via SUBCUTANEOUS
  Filled 2016-08-26 (×2): qty 1
  Filled 2016-08-26: qty 7
  Filled 2016-08-26 (×2): qty 2

## 2016-08-26 MED ORDER — PANTOPRAZOLE SODIUM 40 MG PO TBEC
40.0000 mg | DELAYED_RELEASE_TABLET | Freq: Every day | ORAL | Status: DC
  Administered 2016-08-26 – 2016-08-28 (×3): 40 mg via ORAL
  Filled 2016-08-26 (×3): qty 1

## 2016-08-26 MED ORDER — CARVEDILOL 12.5 MG PO TABS
12.5000 mg | ORAL_TABLET | Freq: Two times a day (BID) | ORAL | Status: DC
  Administered 2016-08-26 – 2016-08-27 (×4): 12.5 mg via ORAL
  Filled 2016-08-26 (×4): qty 1

## 2016-08-26 MED ORDER — ONDANSETRON HCL 4 MG/2ML IJ SOLN: 4.0000 mg | Freq: Four times a day (QID) | INTRAMUSCULAR | Status: DC | PRN

## 2016-08-26 MED ORDER — INSULIN ASPART 100 UNIT/ML ~~LOC~~ SOLN
10.0000 [IU] | Freq: Three times a day (TID) | SUBCUTANEOUS | Status: DC
  Administered 2016-08-26 – 2016-08-28 (×5): 10 [IU] via SUBCUTANEOUS
  Filled 2016-08-26 (×5): qty 10

## 2016-08-26 MED ORDER — ACETAMINOPHEN 325 MG PO TABS: 650.0000 mg | ORAL_TABLET | Freq: Four times a day (QID) | ORAL | Status: DC | PRN

## 2016-08-26 MED ORDER — LISINOPRIL 20 MG PO TABS
40.0000 mg | ORAL_TABLET | Freq: Every evening | ORAL | Status: DC
  Administered 2016-08-26 – 2016-08-27 (×2): 40 mg via ORAL
  Filled 2016-08-26 (×2): qty 2

## 2016-08-26 MED ORDER — ALUM & MAG HYDROXIDE-SIMETH 200-200-20 MG/5ML PO SUSP
30.0000 mL | Freq: Once | ORAL | Status: AC
  Administered 2016-08-26: 30 mL via ORAL
  Filled 2016-08-26: qty 30

## 2016-08-26 MED ORDER — INSULIN NPH (HUMAN) (ISOPHANE) 100 UNIT/ML ~~LOC~~ SUSP: 60.0000 [IU] | Freq: Every day | SUBCUTANEOUS | Status: DC

## 2016-08-26 MED ORDER — INDOMETHACIN 50 MG PO CAPS
50.0000 mg | ORAL_CAPSULE | Freq: Two times a day (BID) | ORAL | Status: DC | PRN
  Filled 2016-08-26: qty 1

## 2016-08-26 NOTE — ED Provider Notes (Signed)
[facility] Emergency Department Provider Note  ____________________________________________  Time seen: Approximately 2:17 AM  I have reviewed the triage vital signs and the nursing notes.   HISTORY  Chief Complaint Post-op Problem   HPI Calvin Good is a 76 y.o. male POD 5 from [place] with arterial access via R groin who presents for evaluation of swelling and bruising of the R groin area. Patient reports that he was in his usual state of health until this evening. When he laid in bed he felt a warm sensation in his right groin. When he looked he noticed swelling and significant amount of bruising. He denies any bruising and swelling this morning when he took a shower. He has no pain. No numbness, weakness, or paresthesias of his right lower extremities. Patient denies chest pain or shortness of breath. His symptoms have been constant since 30 min PTA. Bruising is moderate.   Past Medical History:  Diagnosis Date  . Cancer [facility])    Prostate  . CHF (congestive heart failure) ([place])   . Chronic kidney disease   . Complication of anesthesia    hard to wake up  . COPD (chronic obstructive pulmonary disease) ([place])   . Diabetes mellitus without complication ([place])   . Difficult intubation   . Dyspnea   . GERD (gastroesophageal reflux disease)   . Hyperlipidemia   . Hypertension     Patient Active Problem List   Diagnosis Date Noted  . Chest tightness 08/20/2016  . GERD (gastroesophageal reflux disease) 08/20/2016  . NSTEMI (non-ST elevated myocardial infarction) ([place]) 08/20/2016  . Onychomycosis of toenail 05/13/2016  . Essential hypertension, benign 04/23/2016  . Hyperlipemia 04/23/2016  . Diabetes ([place]) 04/23/2016  . ESRD on dialysis [facility]) 04/23/2016    Past Surgical History:  Procedure Laterality Date  . APPENDECTOMY    . AV FISTULA PLACEMENT Left 05/30/2016   Procedure: ARTERIOVENOUS (AV) FISTULA CREATION ( BRACHIOCEPHALIC );  Surgeon: [name], MD;  Location: [facility] ORS;  Service: Vascular;  Laterality: Left;  . CAPD INSERTION N/A 05/30/2016   Procedure: LAPAROSCOPIC INSERTION CONTINUOUS AMBULATORY PERITONEAL DIALYSIS  (CAPD) CATHETER;  Surgeon: [name], MD;  Location: [facility] ORS;  Service: Vascular;  Laterality: N/A;  . DIALYSIS/PERMA CATHETER INSERTION    . DIALYSIS/PERMA CATHETER REMOVAL N/A 08/21/2016   Procedure: Dialysis/Perma Catheter Removal;  Surgeon: [name], MD;  Location: [place] CV LAB;  Service: Cardiovascular;  Laterality: N/A;  . JOINT REPLACEMENT     right knee   . LEFT HEART CATH AND CORONARY ANGIOGRAPHY N/A 08/21/2016   Procedure: Left Heart Cath and Coronary Angiography possible PCI;  Surgeon: [name], MD;  Location: [place] CV LAB;  Service: Cardiovascular;  Laterality: N/A;  . postate removal     . PROSTATE SURGERY    . REMOVAL OF A DIALYSIS CATHETER N/A 08/08/2016   Procedure: REMOVAL OF A DIALYSIS CATHETER;  Surgeon: [name], MD;  Location: [facility] ORS;  Service: Vascular;  Laterality: N/A;    Prior to Admission medications   Medication Sig Start Date End Date Taking? Authorizing Provider  amLODipine (NORVASC) 10 MG tablet Take 10 mg by mouth every evening.     [provider]  aspirin 81 MG EC tablet Chew 81 mg by mouth daily.     [provider]  atorvastatin (LIPITOR) 20 MG tablet Take 2 tablets (40 mg total) by mouth daily. 08/22/16   Dustin Flock, MD  calcium acetate [facility]) [phone]  MG capsule Take 1,334 mg by mouth 3 (three) times daily with meals.     [provider]  carvedilol (COREG) 25 MG tablet Take 12.5 mg by mouth 2 (two) times daily with a meal.     [provider]  doxazosin (CARDURA) 8 MG tablet Take 8 mg by mouth daily.    [provider]  furosemide (LASIX) 40 MG tablet Take 40 mg by mouth 2 (two) times daily.    [provider]  indomethacin (INDOCIN) 50 MG capsule Take 50 mg by mouth 2 (two) times daily as  needed (gout).    [provider]  insulin lispro (HUMALOG) 100 UNIT/ML injection Inject 10 Units into the skin 3 (three) times daily before meals.     [provider]  insulin NPH Human (HUMULIN N,NOVOLIN N) 100 UNIT/ML injection Inject 60 Units into the skin at bedtime.     [provider]  isosorbide mononitrate (IMDUR) 30 MG 24 hr tablet Take 1 tablet (30 mg total) by mouth daily. 08/22/16   Dustin Flock, MD  lisinopril (PRINIVIL,ZESTRIL) 40 MG tablet Take 40 mg by mouth every evening.     [provider]  omeprazole (PRILOSEC) 20 MG capsule Take 20 mg by mouth 2 (two) times daily before a meal.    [provider]    Allergies Tetracyclines & related and Morphine  Family History  Problem Relation Age of Onset  . Cancer Father     Social History Social History  Substance Use Topics  . Smoking status: Former Smoker    Quit date: 05/21/1982  . Smokeless tobacco: Never Used  . Alcohol use 1.8 oz/week    3 Glasses of wine per week    Review of Systems  Constitutional: Negative for fever. Eyes: Negative for visual changes. ENT: Negative for sore throat. Neck: No neck pain  Cardiovascular: Negative for chest pain. + R groin swelling and bruising Respiratory: Negative for shortness of breath. Gastrointestinal: Negative for abdominal pain, vomiting or diarrhea. Genitourinary: Negative for dysuria.  Musculoskeletal: Negative for back pain. Skin: Negative for rash. Neurological: Negative for headaches, weakness or numbness. Psych: No SI or HI  ____________________________________________   PHYSICAL EXAM:  VITAL SIGNS: ED Triage Vitals [08/26/16 0033]  Enc Vitals Group     BP (!) 141/45     Pulse Rate 74     Resp (!) 22     Temp 98 F (36.7 C)     Temp src      SpO2 92 %     Weight      Height      Head Circumference      Peak Flow      Pain Score      Pain Loc      Pain Edu?      Excl. in [place]?     Constitutional:  Alert and oriented. Well appearing and in no apparent distress. HEENT:      Head: Normocephalic and atraumatic.         Eyes: Conjunctivae are normal. Sclera is non-icteric.       Mouth/Throat: Mucous membranes are moist.       Neck: Supple with no signs of meningismus. Cardiovascular: Regular rate and rhythm. No murmurs, gallops, or rubs. 2+ symmetrical distal pulses are present in all extremities. No JVD.  Respiratory: Normal respiratory effort. Lungs are clear to auscultation bilaterally. No wheezes, crackles, or rhonchi.  Gastrointestinal: Soft, non tender, and non distended with positive  bowel sounds. No rebound or guarding. Musculoskeletal:There is significant amount of swelling and bruising overlying the site of catheterization, no ttp. RLE is warm and well perfused with normal distal pulses. Neurologic: Normal speech and language. Face is symmetric. Moving all extremities. No gross focal neurologic deficits are appreciated. Skin: Skin is warm, dry and intact. No rash noted. Psychiatric: Mood and affect are normal. Speech and behavior are normal.  ____________________________________________   LABS (all labs ordered are listed, but only abnormal results are displayed)  Labs Reviewed  CBC WITH DIFFERENTIAL/PLATELET - Abnormal; Notable for the following:       Result Value   RBC 3.36 (*)    Hemoglobin 10.1 (*)    HCT 29.7 (*)    Platelets 146 (*)    Neutro Abs 6.6 (*)    All other components within normal limits  COMPREHENSIVE METABOLIC PANEL - Abnormal; Notable for the following:    Chloride 96 (*)    Glucose, Bld 254 (*)    BUN 53 (*)    Creatinine, Ser 6.72 (*)    Calcium 8.5 (*)    GFR calc non Af Amer 7 (*)    GFR calc Af Amer 8 (*)    All other components within normal limits  PROTIME-INR  APTT   ____________________________________________  EKG  none  ____________________________________________  RADIOLOGY  [place]: 1. Focal 2.4 x 1.6 x 1.4 cm patent  pseudoaneurysm noted at the right inguinal region, 2 cm below the skin surface, with an associated patent neck to the common femoral artery, demonstrating to and fro flow. 2. Complex elongated fluid collection at the right inguinal region measures 5.8 x 3.9 x 1.0 cm, likely reflecting a soft tissue hematoma. ____________________________________________   PROCEDURES  Procedure(s) performed: None Procedures Critical Care performed:  None ____________________________________________   INITIAL IMPRESSION / ASSESSMENT AND PLAN / ED COURSE  76 y.o. male POD 5 from [place] with arterial access via R groin who presents for evaluation of swelling and bruising of the R groin area overlying the site of catheterization. RLE is warm and well perfused. Ddx pseudoaneurysm, aneurysm, hematoma, DVT. Discussed with Radiologist who recommended [place]. [place] ordered.      _________________________ 4:00 AM on 08/26/2016 -----------------------------------------  Spoke with Radiologist Dr. Radene Knee who interpreted patient's [place]. US showed 2.4 x 1.6 x 1.4 cm pseudoaneurysm of the R femoral artery with associated hematoma. Patient will be admitted to the Hospitalist service.  Pertinent labs & imaging results that were available during my care of the patient were reviewed by me and considered in my medical decision making (see chart for details).    ____________________________________________   FINAL CLINICAL IMPRESSION(S) / ED DIAGNOSES  Final diagnoses:  Groin swelling  Pseudoaneurysm of right femoral artery (HCC)  Hematoma of groin, initial encounter      NEW MEDICATIONS STARTED DURING THIS VISIT:  New Prescriptions   No medications on file     Note:  This document was prepared using Dragon voice recognition software and may include unintentional dictation errors.    Alfred Levins, [place], MD 08/26/16 [phone]

## 2016-08-26 NOTE — Care Management Obs Status (Signed)
[facility] NOTIFICATION   Patient Details  Name: Calvin Good MRN: [id number] Date of Birth: 01/06/1941   Medicare Observation Status Notification Given:  Yes    Katrina Stack, RN 08/26/2016, 11:24 AM

## 2016-08-26 NOTE — Consult Note (Signed)
Reason for Consult: Right CFA Pseudoaneurysm Referring Physician: Dr. Royal Hawthorn Good is an 76 y.o. male.  HPI: Calvin Good with CKD on HD via Good left brachiocephalic AV fistula, CAD- s/p cardiac cath on 08/21/2016 for chest pain- no intervention performed- via Good right 5Fr sheath/Minx closure. No periprocedural events or complaints. Patient noted discoloration of the right groin and thigh last night and presented to the ED. Denied groin or leg pain. US revealed Good 2.4cm X 1.6 cm CFA pseudoaneurysm and 5.8cm hematoma- no active extravasation. Patient evaluated in HD Unit.  Past Medical History:  Diagnosis Date  . Cancer [facility])    Prostate  . CHF (congestive heart failure) ([place])   . Chronic kidney disease   . Complication of anesthesia    hard to wake up  . COPD (chronic obstructive pulmonary disease) ([place])   . Diabetes mellitus without complication ([place])   . Difficult intubation   . Dyspnea   . GERD (gastroesophageal reflux disease)   . Hyperlipidemia   . Hypertension     Past Surgical History:  Procedure Laterality Date  . APPENDECTOMY    . AV FISTULA PLACEMENT Left 05/30/2016   Procedure: ARTERIOVENOUS (AV) FISTULA CREATION ( BRACHIOCEPHALIC );  Surgeon: [name], MD;  Location: [facility] ORS;  Service: Vascular;  Laterality: Left;  . CAPD INSERTION N/Good 05/30/2016   Procedure: LAPAROSCOPIC INSERTION CONTINUOUS AMBULATORY PERITONEAL DIALYSIS  (CAPD) CATHETER;  Surgeon: [name], MD;  Location: [facility] ORS;  Service: Vascular;  Laterality: N/Good;  . DIALYSIS/PERMA CATHETER INSERTION    . DIALYSIS/PERMA CATHETER REMOVAL N/Good 08/21/2016   Procedure: Dialysis/Perma Catheter Removal;  Surgeon: [name], MD;  Location: [place] CV LAB;  Service: Cardiovascular;  Laterality: N/Good;  . JOINT REPLACEMENT     right knee   . LEFT HEART CATH AND CORONARY ANGIOGRAPHY N/Good 08/21/2016   Procedure: Left Heart Cath and Coronary Angiography possible PCI;  Surgeon: [name], MD;  Location: [place] CV LAB;  Service: Cardiovascular;  Laterality: N/Good;  . postate removal     . PROSTATE SURGERY    . REMOVAL OF Good DIALYSIS CATHETER N/Good 08/08/2016   Procedure: REMOVAL OF Good DIALYSIS CATHETER;  Surgeon: [name], MD;  Location: [facility] ORS;  Service: Vascular;  Laterality: N/Good;    Family History  Problem Relation Age of Onset  . Cancer Father   . CAD Neg Hx   . Diabetes Neg Hx     Social History:  reports that he quit smoking about 34 years ago. He has never used smokeless tobacco. He reports that he drinks about 1.8 oz of alcohol per week . He reports that he does not use drugs.  Allergies:  Allergies  Allergen Reactions  . Tetracyclines & Related Other (See Comments)    Reaction: unknown  . Morphine Other (See Comments)    Reaction: confusion    Medications: I have reviewed the patient's current medications.  Results for orders placed or performed during the hospital encounter of 08/26/16 (from the past 48 hour(s))  CBC with Differential     Status: Abnormal   Collection Time: 08/26/16 12:46 AM  Result Value Ref Range   WBC 9.9 3.8 - 10.6 K/uL   RBC 3.36 (L) 4.40 - 5.90 MIL/uL   Hemoglobin 10.1 (L) 13.0 - 18.0 g/dL   HCT 29.7 (L) 40.0 - 52.0 %   MCV 88.6 80.0 - 100.0 fL   MCH 30.2 26.0 - 34.0 pg   MCHC  34.1 32.0 - 36.0 g/dL   RDW 14.4 11.5 - 14.5 %   Platelets 146 (L) 150 - 440 K/uL   Neutrophils Relative % 67 %   Neutro Abs 6.6 (H) 1.4 - 6.5 K/uL   Lymphocytes Relative 21 %   Lymphs Abs 2.1 1.0 - 3.6 K/uL   Monocytes Relative 7 %   Monocytes Absolute 0.7 0.2 - 1.0 K/uL   Eosinophils Relative 4 %   Eosinophils Absolute 0.4 0 - 0.7 K/uL   Basophils Relative 1 %   Basophils Absolute 0.1 0 - 0.1 K/uL  Comprehensive metabolic panel     Status: Abnormal   Collection Time: 08/26/16 12:46 AM  Result Value Ref Range   Sodium 136 135 - 145 mmol/L   Potassium 3.9 3.5 - 5.1 mmol/L   Chloride 96 (L) 101 - 111 mmol/L   CO2 26 22 - 32 mmol/L   Glucose, Bld 254 (H)  65 - 99 mg/dL   BUN 53 (H) 6 - 20 mg/dL   Creatinine, Ser 6.72 (H) 0.61 - 1.24 mg/dL   Calcium 8.5 (L) 8.9 - 10.3 mg/dL   Total Protein 7.0 6.5 - 8.1 g/dL   Albumin 3.5 3.5 - 5.0 g/dL   AST 29 15 - 41 U/L   ALT 23 17 - 63 U/L   Alkaline Phosphatase 64 38 - 126 U/L   Total Bilirubin 0.7 0.3 - 1.2 mg/dL   GFR calc non Af Amer 7 (L) >60 mL/min   GFR calc Af Amer 8 (L) >60 mL/min    Comment: (NOTE) The eGFR has been calculated using the CKD EPI equation. This calculation has not been validated in all clinical situations. eGFR's persistently <60 mL/min signify possible Chronic Kidney Disease.    Anion gap 14 5 - 15  Protime-INR     Status: None   Collection Time: 08/26/16 12:46 AM  Result Value Ref Range   Prothrombin Time 13.8 11.4 - 15.2 seconds   INR 1.06   APTT     Status: None   Collection Time: 08/26/16 12:46 AM  Result Value Ref Range   aPTT 32 24 - 36 seconds  Basic metabolic panel     Status: Abnormal   Collection Time: 08/26/16  7:33 AM  Result Value Ref Range   Sodium 138 135 - 145 mmol/L   Potassium 4.0 3.5 - 5.1 mmol/L   Chloride 100 (L) 101 - 111 mmol/L   CO2 28 22 - 32 mmol/L   Glucose, Bld 243 (H) 65 - 99 mg/dL   BUN 58 (H) 6 - 20 mg/dL   Creatinine, Ser 6.92 (H) 0.61 - 1.24 mg/dL   Calcium 8.4 (L) 8.9 - 10.3 mg/dL   GFR calc non Af Amer 7 (L) >60 mL/min   GFR calc Af Amer 8 (L) >60 mL/min    Comment: (NOTE) The eGFR has been calculated using the CKD EPI equation. This calculation has not been validated in all clinical situations. eGFR's persistently <60 mL/min signify possible Chronic Kidney Disease.    Anion gap 10 5 - 15  CBC     Status: Abnormal   Collection Time: 08/26/16  7:33 AM  Result Value Ref Range   WBC 8.0 3.8 - 10.6 K/uL   RBC 3.14 (L) 4.40 - 5.90 MIL/uL   Hemoglobin 9.6 (L) 13.0 - 18.0 g/dL   HCT 28.2 (L) 40.0 - 52.0 %   MCV 90.0 80.0 - 100.0 fL   MCH 30.7 26.0 - 34.0 pg  MCHC 34.1 32.0 - 36.0 g/dL   RDW 14.7 (H) 11.5 - 14.5 %    Platelets 121 (L) 150 - 440 K/uL  Glucose, capillary     Status: Abnormal   Collection Time: 08/26/16  8:35 AM  Result Value Ref Range   Glucose-Capillary 278 (H) 65 - 99 mg/dL    [place] Lower Ext Art Right Ltd  Result Date: 08/26/2016 CLINICAL DATA:  Acute onset of right groin bruising blood status post recent cardiac catheterization at the right groin. EXAM: UNILATERAL RIGHT LOWER EXTREMITY ARTERIAL DUPLEX SCAN TECHNIQUE: Gray-scale sonography as well as color Doppler and duplex ultrasound was performed to evaluate the arteries of the lower extremity. COMPARISON:  None. FINDINGS: There appears to be Good focal 2.4 x 1.6 x 1.4 cm patent pseudoaneurysm at the right inguinal region, 2 cm below the skin surface, with an associated patent neck to the common femoral artery, demonstrating to and fro flow. There is also Good nearby complex elongated fluid collection at the right inguinal region, measuring 5.8 x 3.9 x 1.0 cm, likely reflecting Good soft tissue hematoma. IMPRESSION: 1. Focal 2.4 x 1.6 x 1.4 cm patent pseudoaneurysm noted at the right inguinal region, 2 cm below the skin surface, with an associated patent neck to the common femoral artery, demonstrating to and fro flow. 2. Complex elongated fluid collection at the right inguinal region measures 5.8 x 3.9 x 1.0 cm, likely reflecting Good soft tissue hematoma. These results were called by telephone at the time of interpretation on 08/26/2016 at 3:52 am to Dr. Rudene Re, who verbally acknowledged these results. Electronically Signed   By: Garald Balding M.D.   On: 08/26/2016 03:53    Review of Systems  Constitutional: Negative.  Negative for fever.  Respiratory: Negative.   Cardiovascular: Negative for chest pain, palpitations, claudication and leg swelling.  Musculoskeletal: Negative.    Blood pressure 112/70, pulse 68, temperature 97.9 F (36.6 C), temperature source Oral, resp. rate 16, height 5' 9"  (1.753 m), weight 108.1 kg (238 lb 5.1 oz), SpO2  96 %. Physical Exam  Nursing note and vitals reviewed. Constitutional: He is oriented to person, place, and time. He appears well-developed and well-nourished.  Cardiovascular: Normal rate and regular rhythm.   Right Groin: ecchymosis over medial thigh and groin, nontender, small soft hematoma, skin intact- no ulceration, Palpable femoral pulse, foot warm, well perfused  Respiratory: Effort normal and breath sounds normal.  GI: Soft. He exhibits no distension. There is no tenderness. There is no rebound and no guarding.  Musculoskeletal: He exhibits no edema or tenderness.  Neurological: He is alert and oriented to person, place, and time.  Skin: Skin is warm and dry.    Assessment/Plan:  Small right CFA pseudoaneurysm and hematoma  The pseudoaneurysm will likely resolve on its own without intervention.  Hold anticoagulation for now. Repeat US tomorrow/Wednesday. If increase in size may require US guided compression or Thrombin injection.   Calvin Good, Calvin Good 08/26/2016, 12:28 PM

## 2016-08-26 NOTE — ED Notes (Signed)
Pt transported to US

## 2016-08-26 NOTE — Progress Notes (Addendum)
[place] at [facility]                                                                                                                                                                                  Patient Demographics   Calvin Good, is a 76 y.o. male, DOB - 27-Feb-1941, MVE:[id number]  Admit date - 08/26/2016   Admitting Physician Saundra Shelling, MD  Outpatient Primary MD for the patient is [name], MD   LOS - 0  Subjective: Patient was recently hospitalized and had a cardiac catheterization at that time was noted to have RCA occlusion and medical management was recommended. Patient returns back with right groin discoloration. Noted to have some hematoma as well as pseudoaneurysm. Patient denies any chest pain or shortness of breath currently    Review of Systems:   CONSTITUTIONAL: No documented fever. No fatigue, weakness. No weight gain, no weight loss.  EYES: No blurry or double vision.  ENT: No tinnitus. No postnasal drip. No redness of the oropharynx.  RESPIRATORY: No cough, no wheeze, no hemoptysis. No dyspnea.  CARDIOVASCULAR: No chest pain. No orthopnea. No palpitations. No syncope.  GASTROINTESTINAL: No nausea, no vomiting or diarrhea. No abdominal pain. No melena or hematochezia.  GENITOURINARY: No dysuria or hematuria.  ENDOCRINE: No polyuria or nocturia. No heat or cold intolerance.  HEMATOLOGY: No anemia. No bruising. No bleeding.  INTEGUMENTARY: Right groin discoloration  MUSCULOSKELETAL: No arthritis. No swelling. No gout.  NEUROLOGIC: No numbness, tingling, or ataxia. No seizure-type activity.  PSYCHIATRIC: No anxiety. No insomnia. No ADD.    Vitals:   Vitals:   08/26/16 1100 08/26/16 1130 08/26/16 1200 08/26/16 1229  BP: 112/70 (!) 152/52 (!) 156/51 (!) 153/56  Pulse: 68  72 67  Resp: 16 16 16 16   Temp:      TempSrc:      SpO2: 96%     Weight:      Height:        Wt Readings from Last 3 Encounters:  08/26/16  238 lb 5.1 oz (108.1 kg)  08/22/16 227 lb 3.2 oz (103.1 kg)  08/01/16 230 lb (104.3 kg)     Intake/Output Summary (Last 24 hours) at 08/26/16 1252 Last data filed at 08/26/16 1031  Gross per 24 hour  Intake              240 ml  Output                0 ml  Net              240 ml    Physical Exam:   GENERAL: Pleasant-appearing in no apparent  distress.  HEAD, EYES, EARS, NOSE AND THROAT: Atraumatic, normocephalic. Extraocular muscles are intact. Pupils equal and reactive to light. Sclerae anicteric. No conjunctival injection. No oro-pharyngeal erythema.  NECK: Supple. There is no jugular venous distention. No bruits, no lymphadenopathy, no thyromegaly.  HEART: Regular rate and rhythm,. No murmurs, no rubs, no clicks.  LUNGS: Clear to auscultation bilaterally. No rales or rhonchi. No wheezes.  ABDOMEN: Soft, flat, nontender, nondistended. Has good bowel sounds. No hepatosplenomegaly appreciated.  EXTREMITIES: Right groin hematoma and discoloration NEUROLOGIC: The patient is alert, awake, and oriented x3 with no focal motor or sensory deficits appreciated bilaterally.  SKIN: Moist and warm with no rashes appreciated.  Psych: Not anxious, depressed LN: No inguinal LN enlargement    Antibiotics   Anti-infectives    None      Medications   Scheduled Meds: . amLODipine  10 mg Oral QPM  . atorvastatin  40 mg Oral Daily  . calcium acetate  1,334 mg Oral TID WC  . carvedilol  12.5 mg Oral BID WC  . doxazosin  8 mg Oral Daily  . insulin aspart  0-9 Units Subcutaneous TID WC  . insulin aspart  10 Units Subcutaneous TID WC  . insulin detemir  60 Units Subcutaneous QHS  . isosorbide mononitrate  30 mg Oral Daily  . lisinopril  40 mg Oral QPM  . pantoprazole  40 mg Oral Daily  . sodium chloride flush  3 mL Intravenous Q12H   Continuous Infusions: . sodium chloride     PRN Meds:.sodium chloride, acetaminophen **OR** acetaminophen, indomethacin, ondansetron **OR** ondansetron  (ZOFRAN) IV, senna-docusate, sodium chloride flush   Data Review:   Micro Results No results found for this or any previous visit (from the past 240 hour(s)).  Radiology Reports [place]  Result Date: 08/26/2016 CLINICAL DATA:  Acute onset of right groin bruising blood status post recent cardiac catheterization at the right groin. EXAM: UNILATERAL RIGHT LOWER EXTREMITY ARTERIAL DUPLEX SCAN TECHNIQUE: Gray-scale sonography as well as color Doppler and duplex ultrasound was performed to evaluate the arteries of the lower extremity. COMPARISON:  None. FINDINGS: There appears to be a focal 2.4 x 1.6 x 1.4 cm patent pseudoaneurysm at the right inguinal region, 2 cm below the skin surface, with an associated patent neck to the common femoral artery, demonstrating to and fro flow. There is also a nearby complex elongated fluid collection at the right inguinal region, measuring 5.8 x 3.9 x 1.0 cm, likely reflecting a soft tissue hematoma. IMPRESSION: 1. Focal 2.4 x 1.6 x 1.4 cm patent pseudoaneurysm noted at the right inguinal region, 2 cm below the skin surface, with an associated patent neck to the common femoral artery, demonstrating to and fro flow. 2. Complex elongated fluid collection at the right inguinal region measures 5.8 x 3.9 x 1.0 cm, likely reflecting a soft tissue hematoma. These results were called by telephone at the time of interpretation on 08/26/2016 at 3:52 am to Dr. Rudene Re, who verbally acknowledged these results. Electronically Signed   By: Garald Balding M.D.   On: 08/26/2016 03:53   Dg Chest Portable 1 View  Result Date: 08/19/2016 CLINICAL DATA:  Acute onset of midsternal chest pain. Initial encounter. EXAM: PORTABLE CHEST 1 VIEW COMPARISON:  None. FINDINGS: The lungs are well-aerated. Mild bibasilar opacities may reflect mild interstitial edema or atelectasis. There is no evidence of pleural effusion or pneumothorax. The cardiomediastinal silhouette is  borderline enlarged. No acute osseous abnormalities are seen.  A right-sided dual-lumen catheter is noted ending about the cavoatrial junction. IMPRESSION: Mild bibasilar airspace opacities may reflect mild interstitial edema or atelectasis. Borderline cardiomegaly. Electronically Signed   By: Garald Balding M.D.   On: 08/19/2016 22:50     CBC  Recent Labs Lab 08/19/16 2233 08/20/16 0314 08/21/16 0830 08/26/16 0046 08/26/16 0733  WBC 9.4 8.9 6.9 9.9 8.0  HGB 11.5* 11.4* 10.6* 10.1* 9.6*  HCT 33.3* 32.8* 30.7* 29.7* 28.2*  PLT 154 152 130* 146* 121*  MCV 89.7 89.9 89.0 88.6 90.0  MCH 31.0 31.2 30.7 30.2 30.7  MCHC 34.6 34.7 34.5 34.1 34.1  RDW 14.4 14.4 14.5 14.4 14.7*  LYMPHSABS  --   --   --  2.1  --   MONOABS  --   --   --  0.7  --   EOSABS  --   --   --  0.4  --   BASOSABS  --   --   --  0.1  --     Chemistries   Recent Labs Lab 08/19/16 2233 08/20/16 0314 08/21/16 1610 08/26/16 0046 08/26/16 0733  NA 134* 134* 134* 136 138  K 4.0 3.8 4.0 3.9 4.0  CL 97* 97* 98* 96* 100*  CO2 29 28 26 26 28   GLUCOSE 380* 354* 300* 254* 243*  BUN 24* 27* 44* 53* 58*  CREATININE 3.85* 4.07* 6.41* 6.72* 6.92*  CALCIUM 8.7* 8.3* 8.5* 8.5* 8.4*  AST  --   --   --  29  --   ALT  --   --   --  23  --   ALKPHOS  --   --   --  64  --   BILITOT  --   --   --  0.7  --    ------------------------------------------------------------------------------------------------------------------ estimated creatinine clearance is 10.7 mL/min (A) (by C-G formula based on SCr of 6.92 mg/dL (H)). ------------------------------------------------------------------------------------------------------------------ No results for input(s): HGBA1C in the last 72 hours. ------------------------------------------------------------------------------------------------------------------ No results for input(s): CHOL, HDL, LDLCALC, TRIG, CHOLHDL, LDLDIRECT in the last 72  hours. ------------------------------------------------------------------------------------------------------------------ No results for input(s): TSH, T4TOTAL, T3FREE, THYROIDAB in the last 72 hours.  Invalid input(s): FREET3 ------------------------------------------------------------------------------------------------------------------ No results for input(s): VITAMINB12, FOLATE, FERRITIN, TIBC, IRON, RETICCTPCT in the last 72 hours.  Coagulation profile  Recent Labs Lab 08/20/16 2229 08/26/16 0046  INR 1.11 1.06    No results for input(s): DDIMER in the last 72 hours.  Cardiac Enzymes  Recent Labs Lab 08/20/16 0314 08/20/16 0846 08/20/16 1513  TROPONINI 2.61* 3.13* 2.28*   ------------------------------------------------------------------------------------------------------------------ Invalid input(s): POCBNP    Assessment & Plan  Patient is a 76 year old with recent cardiac catheterization 1. Right inguinal hematoma and right inguinal pseuoaneursm vascular surgery consult pending, seen by cards, hemoglobin is stable 2. End-stage renal disease patient will have hemodialysis later today 3. Type 2 diabetes meters continue sliding scale insulin and Levemir 4. Coronary artery disease continue Coreg and Imdur 5. Hyperlipidemia unspecified continue Lipitor 6. Essential hypertension continue therapy with amlodipine and Coreg and Cardura 7. Miscellaneous SCDs for DVT prophylaxis     Code Status Orders        Start     Ordered   08/26/16 0645  Full code  Continuous     08/26/16 0644    Code Status History    Date Active Date Inactive Code Status Order ID Comments User Context   08/20/2016  3:05 AM 08/22/2016  1:01 PM Full Code [id number]  Lance Coon, MD ED  Advance Directive Documentation     Most Recent Value  Type of Advance Directive  Healthcare Power of Attorney  Pre-existing out of facility DNR order (yellow form or pink MOST form)  -  "MOST" Form in  Place?  -           Consults  Cadriology, vascular, nephrology  DVT Prophylaxis  scd's  Lab Results  Component Value Date   PLT 121 (L) 08/26/2016     Time Spent in minutes  58min Greater than 50% of time spent in care coordination and counseling patient regarding the condition and plan of care.   Dustin Flock M.D on 08/26/2016 at 12:52 PM  Between 7am to 6pm - Pager - [phone]  After 6pm go to [email or web address] - password [email or web address] [place]   Office  [phone]

## 2016-08-26 NOTE — ED Notes (Signed)
Patient with noted bruising to right groin area.

## 2016-08-26 NOTE — Consult Note (Signed)
[facility] Cardiology  CARDIOLOGY CONSULT NOTE  Patient ID: Calvin Good MRN: [id number] DOB/AGE: [birth year] 78 y.o.  Admit date: 08/26/2016 Referring Physician Posey Pronto Primary Physician [facility] Primary Cardiologist [facility] Reason for Consultation Right femoral artery pseudoanuerysm  HPI: 76 year old gentleman referred for evaluation of right femoral artery pseudoaneurysm. The patient underwent cardiac catheterization 12 days ago which revealed normal left ventricular function, diffuse disease RCA with collaterals from the LAD, and 75% stenosis proximal left circumflex. The patient has been chest pain-free. Last evening, he noted bruising in the right groin and presented to [facility] emergency room. Doppler ultrasound was performed which revealed evidence for pseudoaneurysm.  Review of systems complete and found to be negative unless listed above     Past Medical History:  Diagnosis Date  . Cancer [facility])    Prostate  . CHF (congestive heart failure) ([place])   . Chronic kidney disease   . Complication of anesthesia    hard to wake up  . COPD (chronic obstructive pulmonary disease) ([place])   . Diabetes mellitus without complication ([place])   . Difficult intubation   . Dyspnea   . GERD (gastroesophageal reflux disease)   . Hyperlipidemia   . Hypertension     Past Surgical History:  Procedure Laterality Date  . APPENDECTOMY    . AV FISTULA PLACEMENT Left 05/30/2016   Procedure: ARTERIOVENOUS (AV) FISTULA CREATION ( BRACHIOCEPHALIC );  Surgeon: [name], MD;  Location: [facility] ORS;  Service: Vascular;  Laterality: Left;  . CAPD INSERTION N/A 05/30/2016   Procedure: LAPAROSCOPIC INSERTION CONTINUOUS AMBULATORY PERITONEAL DIALYSIS  (CAPD) CATHETER;  Surgeon: [name], MD;  Location: [facility] ORS;  Service: Vascular;  Laterality: N/A;  . DIALYSIS/PERMA CATHETER INSERTION    . DIALYSIS/PERMA CATHETER REMOVAL N/A 08/21/2016   Procedure: Dialysis/Perma Catheter Removal;  Surgeon: [name], MD;  Location:  [place] CV LAB;  Service: Cardiovascular;  Laterality: N/A;  . JOINT REPLACEMENT     right knee   . LEFT HEART CATH AND CORONARY ANGIOGRAPHY N/A 08/21/2016   Procedure: Left Heart Cath and Coronary Angiography possible PCI;  Surgeon: [name], MD;  Location: [place] CV LAB;  Service: Cardiovascular;  Laterality: N/A;  . postate removal     . PROSTATE SURGERY    . REMOVAL OF A DIALYSIS CATHETER N/A 08/08/2016   Procedure: REMOVAL OF A DIALYSIS CATHETER;  Surgeon: [name], MD;  Location: [facility] ORS;  Service: Vascular;  Laterality: N/A;    Prescriptions Prior to Admission  Medication Sig Dispense Refill Last Dose  . amLODipine (NORVASC) 10 MG tablet Take 10 mg by mouth every evening.    08/19/2016 at Unknown time  . aspirin 81 MG EC tablet Chew 81 mg by mouth daily.    08/19/2016 at Unknown time  . atorvastatin (LIPITOR) 20 MG tablet Take 2 tablets (40 mg total) by mouth daily. 30 tablet 0   . calcium acetate (PHOSLO) 667 MG capsule Take 1,334 mg by mouth 3 (three) times daily with meals.    08/19/2016 at Unknown time  . carvedilol (COREG) 25 MG tablet Take 12.5 mg by mouth 2 (two) times daily with a meal.    08/19/2016 at Unknown time  . doxazosin (CARDURA) 8 MG tablet Take 8 mg by mouth daily.   08/19/2016 at Unknown time  . furosemide (LASIX) 40 MG tablet Take 40 mg by mouth 2 (two) times daily.   08/19/2016 at Unknown time  . indomethacin (INDOCIN) 50 MG capsule Take 50 mg  by mouth 2 (two) times daily as needed (gout).   prn at prn  . insulin lispro (HUMALOG) 100 UNIT/ML injection Inject 10 Units into the skin 3 (three) times daily before meals.    08/19/2016 at Unknown time  . insulin NPH Human (HUMULIN N,NOVOLIN N) 100 UNIT/ML injection Inject 60 Units into the skin at bedtime.    08/18/2016 at Unknown time  . isosorbide mononitrate (IMDUR) 30 MG 24 hr tablet Take 1 tablet (30 mg total) by mouth daily. 60 tablet 0   . lisinopril (PRINIVIL,ZESTRIL) 40 MG tablet Take 40 mg by  mouth every evening.    08/19/2016 at Unknown time  . omeprazole (PRILOSEC) 20 MG capsule Take 20 mg by mouth 2 (two) times daily before a meal.   08/19/2016 at Unknown time   Social History   Social History  . Marital status: Widowed    Spouse name: N/A  . Number of children: N/A  . Years of education: N/A   Occupational History  . retired    Social History Main Topics  . Smoking status: Former Smoker    Quit date: 05/21/1982  . Smokeless tobacco: Never Used  . Alcohol use 1.8 oz/week    3 Glasses of wine per week  . Drug use: No  . Sexual activity: No   Other Topics Concern  . Not on file   Social History Narrative  . No narrative on file    Family History  Problem Relation Age of Onset  . Cancer Father   . CAD Neg Hx   . Diabetes Neg Hx       Review of systems complete and found to be negative unless listed above      PHYSICAL EXAM  General: Well developed, well nourished, in no acute distress HEENT:  Normocephalic and atramatic Neck:  No JVD.  Lungs: Clear bilaterally to auscultation and percussion. Heart: HRRR . Normal S1 and S2 without gallops or murmurs.  Abdomen: Bowel sounds are positive, abdomen soft and non-tender  Msk:  Back normal, normal gait. Normal strength and tone for age. Extremities: Small hematoma right groin with surrounding bruising and discoloration  Neuro: Alert and oriented X 3. Psych:  Good affect, responds appropriately  Labs:   Lab Results  Component Value Date   WBC 8.0 08/26/2016   HGB 9.6 (L) 08/26/2016   HCT 28.2 (L) 08/26/2016   MCV 90.0 08/26/2016   PLT 121 (L) 08/26/2016    Recent Labs Lab 08/26/16 0046 08/26/16 0733  NA 136 138  K 3.9 4.0  CL 96* 100*  CO2 26 28  BUN 53* 58*  CREATININE 6.72* 6.92*  CALCIUM 8.5* 8.4*  PROT 7.0  --   BILITOT 0.7  --   ALKPHOS 64  --   ALT 23  --   AST 29  --   GLUCOSE 254* 243*   Lab Results  Component Value Date   TROPONINI 2.28 ([place]) 08/20/2016   No results found  for: CHOL No results found for: HDL No results found for: LDLCALC No results found for: TRIG No results found for: CHOLHDL No results found for: LDLDIRECT    Radiology: [place] Lower Ext Art Right Ltd  Result Date: 08/26/2016 CLINICAL DATA:  Acute onset of right groin bruising blood status post recent cardiac catheterization at the right groin. EXAM: UNILATERAL RIGHT LOWER EXTREMITY ARTERIAL DUPLEX SCAN TECHNIQUE: Gray-scale sonography as well as color Doppler and duplex ultrasound was performed to evaluate the arteries of the lower  extremity. COMPARISON:  None. FINDINGS: There appears to be a focal 2.4 x 1.6 x 1.4 cm patent pseudoaneurysm at the right inguinal region, 2 cm below the skin surface, with an associated patent neck to the common femoral artery, demonstrating to and fro flow. There is also a nearby complex elongated fluid collection at the right inguinal region, measuring 5.8 x 3.9 x 1.0 cm, likely reflecting a soft tissue hematoma. IMPRESSION: 1. Focal 2.4 x 1.6 x 1.4 cm patent pseudoaneurysm noted at the right inguinal region, 2 cm below the skin surface, with an associated patent neck to the common femoral artery, demonstrating to and fro flow. 2. Complex elongated fluid collection at the right inguinal region measures 5.8 x 3.9 x 1.0 cm, likely reflecting a soft tissue hematoma. These results were called by telephone at the time of interpretation on 08/26/2016 at 3:52 am to Dr. Rudene Re, who verbally acknowledged these results. Electronically Signed   By: Garald Balding M.D.   On: 08/26/2016 03:53   Dg Chest Portable 1 View  Result Date: 08/19/2016 CLINICAL DATA:  Acute onset of midsternal chest pain. Initial encounter. EXAM: PORTABLE CHEST 1 VIEW COMPARISON:  None. FINDINGS: The lungs are well-aerated. Mild bibasilar opacities may reflect mild interstitial edema or atelectasis. There is no evidence of pleural effusion or pneumothorax. The cardiomediastinal silhouette is borderline  enlarged. No acute osseous abnormalities are seen. A right-sided dual-lumen catheter is noted ending about the cavoatrial junction. IMPRESSION: Mild bibasilar airspace opacities may reflect mild interstitial edema or atelectasis. Borderline cardiomegaly. Electronically Signed   By: Garald Balding M.D.   On: 08/19/2016 22:50    EKG: Normal sinus rhythm, right bundle branch block  ASSESSMENT AND PLAN:   1. Right femoral artery pseudoaneurysm following cardiac catheterization, without evidence for infection or vascular compromise 2. Two-vessel coronary artery disease, stable, no chest pain 3. End-stage renal disease  Recommendations  1. Agree with current therapy 2. Vascular consult pending 3. Hold aspirin for now  Signed: Isaias Cowman MD,PhD, [facility] 08/26/2016, 9:25 AM

## 2016-08-26 NOTE — Progress Notes (Signed)
[facility]  ROUNDING NOTE   Subjective:   Admitted for Groin swelling [R19.09] Pseudoaneurysm of right femoral artery (HCC) [I72.4] Hematoma of groin, initial encounter [S30.1XXA]   Seen and examined on hemodialysis. Tolerating treatment well. UF of 1.5 litres  Objective:  Vital signs in last 24 hours:  Temp:  [97.8 F (36.6 C)-98 F (36.7 C)] 97.9 F (36.6 C) (05/28 0925) Pulse Rate:  [68-74] 68 (05/28 1100) Resp:  [16-22] 16 (05/28 1100) BP: (112-160)/(45-140) 112/70 (05/28 1100) SpO2:  [92 %-97 %] 96 % (05/28 1100) Weight:  [106.9 kg (235 lb 9.6 oz)-108.1 kg (238 lb 5.1 oz)] 108.1 kg (238 lb 5.1 oz) (05/28 0925)  Weight change:  Filed Weights   08/26/16 0702 08/26/16 0925  Weight: 106.9 kg (235 lb 9.6 oz) 108.1 kg (238 lb 5.1 oz)    Intake/Output: No intake/output data recorded.   Intake/Output this shift:  Total I/O In: 240 [P.O.:240] Out: -   Physical Exam: General: NAD, sitting up in bed  Head: Normocephalic, atraumatic. Moist oral mucosal membranes  Eyes: Anicteric, PERRL  Neck: Supple, trachea midline  Lungs:  Clear to auscultation  Heart: Regular rate and rhythm  Abdomen:  Soft, nontender,   Extremities: no peripheral edema.  Neurologic: Nonfocal, moving all four extremities  Skin: No lesions  GU: Right femoral hematoma  Access: Right AVF, RIJ permcath    Basic Metabolic Panel:  Recent Labs Lab 08/19/16 2233 08/20/16 0314 08/21/16 1610 08/26/16 0046 08/26/16 0733  NA 134* 134* 134* 136 138  K 4.0 3.8 4.0 3.9 4.0  CL 97* 97* 98* 96* 100*  CO2 29 28 26 26 28   GLUCOSE 380* 354* 300* 254* 243*  BUN 24* 27* 44* 53* 58*  CREATININE 3.85* 4.07* 6.41* 6.72* 6.92*  CALCIUM 8.7* 8.3* 8.5* 8.5* 8.4*  PHOS  --   --  5.2*  --   --     Liver Function Tests:  Recent Labs Lab 08/21/16 1610 08/26/16 0046  AST  --  29  ALT  --  23  ALKPHOS  --  64  BILITOT  --  0.7  PROT  --  7.0  ALBUMIN 3.2* 3.5   No results for input(s):  LIPASE, AMYLASE in the last 168 hours. No results for input(s): AMMONIA in the last 168 hours.  CBC:  Recent Labs Lab 08/19/16 2233 08/20/16 0314 08/21/16 0830 08/26/16 0046 08/26/16 0733  WBC 9.4 8.9 6.9 9.9 8.0  NEUTROABS  --   --   --  6.6*  --   HGB 11.5* 11.4* 10.6* 10.1* 9.6*  HCT 33.3* 32.8* 30.7* 29.7* 28.2*  MCV 89.7 89.9 89.0 88.6 90.0  PLT 154 152 130* 146* 121*    Cardiac Enzymes:  Recent Labs Lab 08/19/16 2233 08/20/16 0203 08/20/16 0314 08/20/16 0846 08/20/16 1513  TROPONINI 0.11* 1.81* 2.61* 3.13* 2.28*    BNP: Invalid input(s): POCBNP  CBG:  Recent Labs Lab 08/21/16 0542 08/21/16 0739 08/21/16 2102 08/22/16 0754 08/26/16 0835  GLUCAP 240* 223* 150* 75* 34*    Microbiology: Results for orders placed or performed during the hospital encounter of 08/06/16  Surgical pcr screen     Status: None   Collection Time: 08/06/16 11:53 AM  Result Value Ref Range Status   MRSA, PCR NEGATIVE NEGATIVE Final   Staphylococcus aureus NEGATIVE NEGATIVE Final    Comment:        The Xpert SA Assay (FDA approved for NASAL specimens in patients over 23 years of age),  is one component of a comprehensive surveillance program.  Test performance has been validated by [facility] for patients greater than or equal to 7 year old. It is not intended to diagnose infection nor to guide or monitor treatment.     Coagulation Studies:  Recent Labs  08/26/16 0046  LABPROT 13.8  INR 1.06    Urinalysis: No results for input(s): COLORURINE, LABSPEC, PHURINE, GLUCOSEU, HGBUR, BILIRUBINUR, KETONESUR, PROTEINUR, UROBILINOGEN, NITRITE, LEUKOCYTESUR in the last 72 hours.  Invalid input(s): APPERANCEUR    Imaging: [place] Lower Ext Art Right Ltd  Result Date: 08/26/2016 CLINICAL DATA:  Acute onset of right groin bruising blood status post recent cardiac catheterization at the right groin. EXAM: UNILATERAL RIGHT LOWER EXTREMITY ARTERIAL DUPLEX SCAN TECHNIQUE:  Gray-scale sonography as well as color Doppler and duplex ultrasound was performed to evaluate the arteries of the lower extremity. COMPARISON:  None. FINDINGS: There appears to be a focal 2.4 x 1.6 x 1.4 cm patent pseudoaneurysm at the right inguinal region, 2 cm below the skin surface, with an associated patent neck to the common femoral artery, demonstrating to and fro flow. There is also a nearby complex elongated fluid collection at the right inguinal region, measuring 5.8 x 3.9 x 1.0 cm, likely reflecting a soft tissue hematoma. IMPRESSION: 1. Focal 2.4 x 1.6 x 1.4 cm patent pseudoaneurysm noted at the right inguinal region, 2 cm below the skin surface, with an associated patent neck to the common femoral artery, demonstrating to and fro flow. 2. Complex elongated fluid collection at the right inguinal region measures 5.8 x 3.9 x 1.0 cm, likely reflecting a soft tissue hematoma. These results were called by telephone at the time of interpretation on 08/26/2016 at 3:52 am to Dr. Rudene Re, who verbally acknowledged these results. Electronically Signed   By: Garald Balding M.D.   On: 08/26/2016 03:53     Medications:   . sodium chloride     . amLODipine  10 mg Oral QPM  . atorvastatin  40 mg Oral Daily  . calcium acetate  1,334 mg Oral TID WC  . carvedilol  12.5 mg Oral BID WC  . doxazosin  8 mg Oral Daily  . insulin aspart  0-9 Units Subcutaneous TID WC  . insulin aspart  10 Units Subcutaneous TID WC  . insulin detemir  60 Units Subcutaneous QHS  . isosorbide mononitrate  30 mg Oral Daily  . lisinopril  40 mg Oral QPM  . pantoprazole  40 mg Oral Daily  . sodium chloride flush  3 mL Intravenous Q12H   sodium chloride, acetaminophen **OR** acetaminophen, indomethacin, ondansetron **OR** ondansetron (ZOFRAN) IV, senna-docusate, sodium chloride flush  Assessment/ Plan:  [name] is a 76 y.o. white male with diabetes mellitus type II insulin dependent, hypertension,  hyperlipidemia, prostate cancer status post prostatectomy, right total knee, appendectomy.   MWF [facility] AVF  1. End Stage Renal Disease MWF: Seen and examined on hemodialysis. Tolerating treatment well.   2. Hypertension: blood pressure at goal.  - carvedilol, amlodipine, lisinopril, doxazosin  3. Anemia of chronic kidney disease: hemoglobin 9.6.  - Mircera as outpatient.   4. Secondary Hyperparathyroidism: outpatient PTH 263 on 5/18.  - calcium acetate    LOS: 0 Lakeyta Vandenheuvel 5/28/201811:35 AM

## 2016-08-26 NOTE — ED Notes (Signed)
Spoke with Dr. Alfred Levins, orders received.

## 2016-08-26 NOTE — H&P (Signed)
[place] [facility] [place] NAME: Calvin Good    MR#:  [id number]  DATE OF BIRTH:  1940/04/17  DATE OF ADMISSION:  08/26/2016  PRIMARY CARE PHYSICIAN: [name], MD   REQUESTING/REFERRING PHYSICIAN:   CHIEF COMPLAINT:   Chief Complaint  Patient presents with  . Post-op Problem    HISTORY OF PRESENT ILLNESS: Calvin Good  is a 76 y.o. male with a known history of prostate cancer, congestive heart failure, chronic kidney disease, COPD, diabetes mellitus type 2, GERD, hyperlipidemia, hypertension presented to the emergency room with the discoloration in the groin area. Patient had cardiac catheter done last Wednesday that is 11 days ago. He noticed a discoloration in the right groin area since yesterday. No complaints of any pain or any tenderness. No complaints of any chest pain, shortness of breath. Patient was evaluated with a vascular ultrasound of the right groin and thigh area which showed hematoma and pseudoaneurysm. Hospitalist service was consulted for further care of the patient. No fever, chills and cough.  PAST MEDICAL HISTORY:   Past Medical History:  Diagnosis Date  . Cancer [facility])    Prostate  . CHF (congestive heart failure) ([place])   . Chronic kidney disease   . Complication of anesthesia    hard to wake up  . COPD (chronic obstructive pulmonary disease) ([place])   . Diabetes mellitus without complication ([place])   . Difficult intubation   . Dyspnea   . GERD (gastroesophageal reflux disease)   . Hyperlipidemia   . Hypertension     PAST SURGICAL HISTORY: Past Surgical History:  Procedure Laterality Date  . APPENDECTOMY    . AV FISTULA PLACEMENT Left 05/30/2016   Procedure: ARTERIOVENOUS (AV) FISTULA CREATION ( BRACHIOCEPHALIC );  Surgeon: [name], MD;  Location: [facility] ORS;  Service: Vascular;  Laterality: Left;  . CAPD INSERTION N/A 05/30/2016   Procedure: LAPAROSCOPIC INSERTION CONTINUOUS AMBULATORY PERITONEAL  DIALYSIS  (CAPD) CATHETER;  Surgeon: [name], MD;  Location: [facility] ORS;  Service: Vascular;  Laterality: N/A;  . DIALYSIS/PERMA CATHETER INSERTION    . DIALYSIS/PERMA CATHETER REMOVAL N/A 08/21/2016   Procedure: Dialysis/Perma Catheter Removal;  Surgeon: [name], MD;  Location: [place] CV LAB;  Service: Cardiovascular;  Laterality: N/A;  . JOINT REPLACEMENT     right knee   . LEFT HEART CATH AND CORONARY ANGIOGRAPHY N/A 08/21/2016   Procedure: Left Heart Cath and Coronary Angiography possible PCI;  Surgeon: [name], MD;  Location: [place] CV LAB;  Service: Cardiovascular;  Laterality: N/A;  . postate removal     . PROSTATE SURGERY    . REMOVAL OF A DIALYSIS CATHETER N/A 08/08/2016   Procedure: REMOVAL OF A DIALYSIS CATHETER;  Surgeon: [name], MD;  Location: [facility] ORS;  Service: Vascular;  Laterality: N/A;    SOCIAL HISTORY:  Social History  Substance Use Topics  . Smoking status: Former Smoker    Quit date: 05/21/1982  . Smokeless tobacco: Never Used  . Alcohol use 1.8 oz/week    3 Glasses of wine per week    FAMILY HISTORY:  Family History  Problem Relation Age of Onset  . Cancer Father   . CAD Neg Hx   . Diabetes Neg Hx     DRUG ALLERGIES:  Allergies  Allergen Reactions  . Tetracyclines & Related Other (See Comments)    Reaction: unknown  . Morphine Other (See Comments)    Reaction: confusion  REVIEW OF SYSTEMS:   CONSTITUTIONAL: No fever, fatigue or weakness.  EYES: No blurred or double vision.  EARS, NOSE, AND THROAT: No tinnitus or ear pain.  RESPIRATORY: No cough, shortness of breath, wheezing or hemoptysis.  CARDIOVASCULAR: No chest pain, orthopnea, edema.  GASTROINTESTINAL: No nausea, vomiting, diarrhea or abdominal pain.  GENITOURINARY: No dysuria, hematuria.  ENDOCRINE: No polyuria, nocturia,  HEMATOLOGY: No anemia, easy bruising or bleeding SKIN: Discoloration of skin right groin area. MUSCULOSKELETAL: No joint pain or  arthritis.   NEUROLOGIC: No tingling, numbness, weakness.  PSYCHIATRY: No anxiety or depression.   MEDICATIONS AT HOME:  Prior to Admission medications   Medication Sig Start Date End Date Taking? Authorizing Provider  amLODipine (NORVASC) 10 MG tablet Take 10 mg by mouth every evening.    Yes [provider]  aspirin 81 MG EC tablet Chew 81 mg by mouth daily.    Yes [provider]  atorvastatin (LIPITOR) 20 MG tablet Take 2 tablets (40 mg total) by mouth daily. 08/22/16  Yes Dustin Flock, MD  calcium acetate (PHOSLO) 667 MG capsule Take 1,334 mg by mouth 3 (three) times daily with meals.    Yes [provider]  carvedilol (COREG) 25 MG tablet Take 12.5 mg by mouth 2 (two) times daily with a meal.    Yes [provider]  doxazosin (CARDURA) 8 MG tablet Take 8 mg by mouth daily.   Yes [provider]  furosemide (LASIX) 40 MG tablet Take 40 mg by mouth 2 (two) times daily.   Yes [provider]  indomethacin (INDOCIN) 50 MG capsule Take 50 mg by mouth 2 (two) times daily as needed (gout).   Yes [provider]  insulin lispro (HUMALOG) 100 UNIT/ML injection Inject 10 Units into the skin 3 (three) times daily before meals.    Yes [provider]  insulin NPH Human (HUMULIN N,NOVOLIN N) 100 UNIT/ML injection Inject 60 Units into the skin at bedtime.    Yes [provider]  isosorbide mononitrate (IMDUR) 30 MG 24 hr tablet Take 1 tablet (30 mg total) by mouth daily. 08/22/16  Yes Dustin Flock, MD  lisinopril (PRINIVIL,ZESTRIL) 40 MG tablet Take 40 mg by mouth every evening.    Yes [provider]  omeprazole (PRILOSEC) 20 MG capsule Take 20 mg by mouth 2 (two) times daily before a meal.   Yes [provider]      PHYSICAL EXAMINATION:   VITAL SIGNS: Blood pressure (!) 152/53, pulse 72, temperature 98 F (36.7 C), resp. rate (!) 22, SpO2 97 %.  GENERAL:  76 y.o.-year-old patient lying in the  bed with no acute distress.  EYES: Pupils equal, round, reactive to light and accommodation. No scleral icterus. Extraocular muscles intact.  HEENT: Head atraumatic, normocephalic. Oropharynx and nasopharynx clear.  NECK:  Supple, no jugular venous distention. No thyroid enlargement, no tenderness.  LUNGS: Normal breath sounds bilaterally, no wheezing, rales,rhonchi or crepitation. No use of accessory muscles of respiration.  CARDIOVASCULAR: S1, S2 normal. No murmurs, rubs, or gallops.  ABDOMEN: Soft, nontender, nondistended. Bowel sounds present. No organomegaly or mass.  EXTREMITIES: No pedal edema, cyanosis, or clubbing. Right groin hematoma and swelling noted  NEUROLOGIC: Cranial nerves II through XII are intact. Muscle strength 5/5 in all extremities. Sensation intact. Gait not checked.  PSYCHIATRIC: The patient is alert and oriented x 3.  SKIN: No obvious rash, lesion, or ulcer.   LABORATORY PANEL:   CBC  Recent Labs Lab 08/19/16 2233 08/20/16  2992 08/21/16 0830 08/26/16 0046  WBC 9.4 8.9 6.9 9.9  HGB 11.5* 11.4* 10.6* 10.1*  HCT 33.3* 32.8* 30.7* 29.7*  PLT 154 152 130* 146*  MCV 89.7 89.9 89.0 88.6  MCH 31.0 31.2 30.7 30.2  MCHC 34.6 34.7 34.5 34.1  RDW 14.4 14.4 14.5 14.4  LYMPHSABS  --   --   --  2.1  MONOABS  --   --   --  0.7  EOSABS  --   --   --  0.4  BASOSABS  --   --   --  0.1   ------------------------------------------------------------------------------------------------------------------  Chemistries   Recent Labs Lab 08/19/16 2233 08/20/16 0314 08/21/16 1610 08/26/16 0046  NA 134* 134* 134* 136  K 4.0 3.8 4.0 3.9  CL 97* 97* 98* 96*  CO2 29 28 26 26   GLUCOSE 380* 354* 300* 254*  BUN 24* 27* 44* 53*  CREATININE 3.85* 4.07* 6.41* 6.72*  CALCIUM 8.7* 8.3* 8.5* 8.5*  AST  --   --   --  29  ALT  --   --   --  23  ALKPHOS  --   --   --  64  BILITOT  --   --   --  0.7    ------------------------------------------------------------------------------------------------------------------ estimated creatinine clearance is 10.7 mL/min (A) (by C-G formula based on SCr of 6.72 mg/dL (H)). ------------------------------------------------------------------------------------------------------------------ No results for input(s): TSH, T4TOTAL, T3FREE, THYROIDAB in the last 72 hours.  Invalid input(s): FREET3   Coagulation profile  Recent Labs Lab 08/20/16 2229 08/26/16 0046  INR 1.11 1.06   ------------------------------------------------------------------------------------------------------------------- No results for input(s): DDIMER in the last 72 hours. -------------------------------------------------------------------------------------------------------------------  Cardiac Enzymes  Recent Labs Lab 08/20/16 0314 08/20/16 0846 08/20/16 1513  TROPONINI 2.61* 3.13* 2.28*   ------------------------------------------------------------------------------------------------------------------ Invalid input(s): POCBNP  ---------------------------------------------------------------------------------------------------------------  Urinalysis No results found for: COLORURINE, APPEARANCEUR, LABSPEC, PHURINE, GLUCOSEU, HGBUR, BILIRUBINUR, KETONESUR, PROTEINUR, UROBILINOGEN, NITRITE, LEUKOCYTESUR   RADIOLOGY: [place] Lower Ext Art Right Ltd  Result Date: 08/26/2016 CLINICAL DATA:  Acute onset of right groin bruising blood status post recent cardiac catheterization at the right groin. EXAM: UNILATERAL RIGHT LOWER EXTREMITY ARTERIAL DUPLEX SCAN TECHNIQUE: Gray-scale sonography as well as color Doppler and duplex ultrasound was performed to evaluate the arteries of the lower extremity. COMPARISON:  None. FINDINGS: There appears to be a focal 2.4 x 1.6 x 1.4 cm patent pseudoaneurysm at the right inguinal region, 2 cm below the skin surface, with an associated patent  neck to the common femoral artery, demonstrating to and fro flow. There is also a nearby complex elongated fluid collection at the right inguinal region, measuring 5.8 x 3.9 x 1.0 cm, likely reflecting a soft tissue hematoma. IMPRESSION: 1. Focal 2.4 x 1.6 x 1.4 cm patent pseudoaneurysm noted at the right inguinal region, 2 cm below the skin surface, with an associated patent neck to the common femoral artery, demonstrating to and fro flow. 2. Complex elongated fluid collection at the right inguinal region measures 5.8 x 3.9 x 1.0 cm, likely reflecting a soft tissue hematoma. These results were called by telephone at the time of interpretation on 08/26/2016 at 3:52 am to Dr. Rudene Re, who verbally acknowledged these results. Electronically Signed   By: Garald Balding M.D.   On: 08/26/2016 03:53    EKG: Orders placed or performed during the hospital encounter of 08/19/16  . ED EKG within 10 minutes  . ED EKG within 10 minutes  . EKG    IMPRESSION AND PLAN: 76 year old male  patient with history of end-stage renal disease, congestive heart failure, COPD, diabetes mellitus, GERD, hyperlipidemia, hypertension presented to the emergency room with discoloration and swelling in the right groin area. Admitting diagnosis 1. Right inguinal hematoma 2. Right inguinal pseudoaneurysm 3. Congestive heart failure 4. Type 2 diabetes meters 5. End-stage renal disease Treatment plan Admit patient to telemetry observation bed Hold aspirin and blood thinners Cardiology consultation Vascular surgery consultation Resume cardiac medications Monitor hemoglobin and hematocrit DVT prophylaxis sequential compression device to lower extremities  All the records are reviewed and case discussed with ED provider. Management plans discussed with the patient, family and they are in agreement.  CODE STATUS:FULL CODE Surrogate decision-maker : Daughter Code Status History    Date Active Date Inactive Code  Status Order ID Comments User Context   08/20/2016  3:05 AM 08/22/2016  1:01 PM Full Code [id number]  Lance Coon, MD ED       TOTAL TIME TAKING CARE OF THIS PATIENT: 50 minutes.    Saundra Shelling M.D on 08/26/2016 at 4:47 AM  Between 7am to 6pm - Pager - [phone]  After 6pm go to [email or web address] - password [email or web address] [facility]  [place] [facility]  Office  [phone]  CC: Primary care physician; [name], MD

## 2016-08-26 NOTE — ED Triage Notes (Signed)
Patient had cardiac cath 4 days ago reports area to right groin sticky and discolored approximately 30 minutes prior to arrival.  Reports today was walking around but denies any heavy lifting.

## 2016-08-27 ENCOUNTER — Observation Stay: Payer: Medicare Other

## 2016-08-27 ENCOUNTER — Encounter
Admission: EM | Disposition: A | Discharge status: Home Health | Payer: Self-pay | Source: Home / Self Care | Attending: Internal Medicine

## 2016-08-27 DIAGNOSIS — I724 Aneurysm of artery of lower extremity: Secondary | ICD-10-CM

## 2016-08-27 HISTORY — PX: PSEUDOANERYSM COMPRESSION: CATH118259

## 2016-08-27 LAB — CBC
HCT: 25 % — ABNORMAL LOW (ref 40.0–52.0)
Hemoglobin: 8.7 g/dL — ABNORMAL LOW (ref 13.0–18.0)
MCH: 30.8 pg (ref 26.0–34.0)
MCHC: 34.7 g/dL (ref 32.0–36.0)
MCV: 88.6 fL (ref 80.0–100.0)
PLATELETS: 123 10*3/uL — AB (ref 150–440)
RBC: 2.82 MIL/uL — ABNORMAL LOW (ref 4.40–5.90)
RDW: 14.4 % (ref 11.5–14.5)
WBC: 8.6 10*3/uL (ref 3.8–10.6)

## 2016-08-27 LAB — RENAL FUNCTION PANEL
Albumin: 3.2 g/dL — ABNORMAL LOW (ref 3.5–5.0)
Anion gap: 9 (ref 5–15)
BUN: 37 mg/dL — AB (ref 6–20)
CHLORIDE: 99 mmol/L — AB (ref 101–111)
CO2: 30 mmol/L (ref 22–32)
Calcium: 8.5 mg/dL — ABNORMAL LOW (ref 8.9–10.3)
Creatinine, Ser: 5.23 mg/dL — ABNORMAL HIGH (ref 0.61–1.24)
GFR calc Af Amer: 11 mL/min — ABNORMAL LOW (ref >60)
GFR calc non Af Amer: 9 mL/min — ABNORMAL LOW (ref >60)
Glucose, Bld: 161 mg/dL — ABNORMAL HIGH (ref 65–99)
Phosphorus: 3.7 mg/dL (ref 2.5–4.6)
Potassium: 3.9 mmol/L (ref 3.5–5.1)
Sodium: 138 mmol/L (ref 135–145)

## 2016-08-27 LAB — GLUCOSE, CAPILLARY
Glucose-Capillary: 134 mg/dL — ABNORMAL HIGH (ref 65–99)
Glucose-Capillary: 165 mg/dL — ABNORMAL HIGH (ref 65–99)
Glucose-Capillary: 131 mg/dL — ABNORMAL HIGH (ref 65–99)
Glucose-Capillary: 155 mg/dL — ABNORMAL HIGH (ref 65–99)

## 2016-08-27 SURGERY — PSEUDOANERYSM COMPRESSION
Anesthesia: IV Sedation (MBSC Only) | Laterality: Right

## 2016-08-27 MED ORDER — SODIUM CHLORIDE FLUSH 0.9 % IV SOLN
INTRAVENOUS | Status: AC
  Filled 2016-08-27: qty 10

## 2016-08-27 MED ORDER — MIDAZOLAM HCL 5 MG/5ML IJ SOLN
INTRAMUSCULAR | Status: AC
  Filled 2016-08-27: qty 5

## 2016-08-27 MED ORDER — THROMBIN 5000 UNITS EX SOLR
CUTANEOUS | Status: DC | PRN
  Administered 2016-08-27: 2000 [IU] via TOPICAL

## 2016-08-27 MED ORDER — SODIUM CHLORIDE 0.9 % IJ SOLN
INTRAMUSCULAR | Status: AC
  Filled 2016-08-27: qty 50

## 2016-08-27 MED ORDER — THROMBIN 20000 UNITS EX KIT
20000.0000 [IU] | PACK | Freq: Once | CUTANEOUS | Status: DC
  Filled 2016-08-27 (×2): qty 1

## 2016-08-27 MED ORDER — THROMBIN 5000 UNITS EX SOLR
10000.0000 [IU] | Freq: Once | CUTANEOUS | Status: AC
  Administered 2016-08-27: 5000 [IU] via TOPICAL
  Filled 2016-08-27: qty 10000

## 2016-08-27 MED ORDER — FENTANYL CITRATE (PF) 100 MCG/2ML IJ SOLN
INTRAMUSCULAR | Status: AC
  Filled 2016-08-27: qty 2

## 2016-08-27 MED ORDER — MIDAZOLAM HCL 2 MG/2ML IJ SOLN
INTRAMUSCULAR | Status: DC | PRN
  Administered 2016-08-27 (×2): 2 mg via INTRAVENOUS

## 2016-08-27 MED ORDER — LIDOCAINE-PRILOCAINE 2.5-2.5 % EX CREA
TOPICAL_CREAM | CUTANEOUS | Status: DC
  Administered 2016-08-28: 09:00:00 via TOPICAL
  Filled 2016-08-27: qty 5

## 2016-08-27 SURGICAL SUPPLY — 1 items: NEEDLE ENTRY 21GA 7CM ECHOTIP (NEEDLE) ×3 IMPLANT

## 2016-08-27 NOTE — Op Note (Signed)
    OPERATIVE NOTE   PROCEDURE: Right femoral pseudoaneurysm thrombin injection  PRE-OPERATIVE DIAGNOSIS: Right femoral pseudoaneurysm  POST-OPERATIVE DIAGNOSIS: Same  SURGEON: Hortencia Pilar  ASSISTANT(S): none  ANESTHESIA: IV sedation  ESTIMATED BLOOD LOSS: 10 cc  FINDING(S): 3 cm pseudoaneurysm  SPECIMEN(S):  None  INDICATIONS:   Calvin Good is a 76 y.o. male who presents with right groin pseudoaneurysm status post cardiac catheter we have documented increase in size of the pseudoaneurysm over the past 24 hours and therefore he is undergoing thrombin injection for resolution.  DESCRIPTION: After full informed written consent was obtained from the patient, the patient was brought back to the operating room and placed supine upon the operating table.  Prior to induction, the patient received IV antibiotics.   After obtaining adequate anesthesia, the patient was then prepped and draped in the standard fashion for a  thrombin injection under ultrasound guidance.  The microneedle is inserted into the pseudoaneurysm capsule under direct ultrasound visualization and 1/2 cc of thrombin was injected under direct ultrasound visualization. Immediately following the injection there is now heterogeneous material noted within the pseudoaneurysm and there is complete absence of flow.  Dorsalis pedis Doppler signal at the conclusion of the procedure remains triphasic   The patient tolerated this procedure well.   COMPLICATIONS: None  CONDITION: [name]  Office: [phone]   08/27/2016, 4:49 PM

## 2016-08-27 NOTE — Progress Notes (Signed)
[facility]  ROUNDING NOTE   Subjective:   Admitted for Groin swelling [R19.09] Pseudoaneurysm of right femoral artery (HCC) [I72.4] Hematoma of groin, initial encounter [S30.1XXA]   Still reports some SOB and requiring oxygen States hematoma site is better  Objective:  Vital signs in last 24 hours:  Temp:  [97.8 F (36.6 C)-98.3 F (36.8 C)] 98.3 F (36.8 C) (05/29 0350) Pulse Rate:  [67-72] 72 (05/29 0350) Resp:  [16-18] 18 (05/29 0350) BP: (132-156)/(42-113) 133/42 (05/29 0350) SpO2:  [95 %-96 %] 96 % (05/29 0350) Weight:  [106.6 kg (235 lb 0.2 oz)] 106.6 kg (235 lb 0.2 oz) (05/28 1405)  Weight change:  Filed Weights   08/26/16 0702 08/26/16 0925 08/26/16 1405  Weight: 106.9 kg (235 lb 9.6 oz) 108.1 kg (238 lb 5.1 oz) 106.6 kg (235 lb 0.2 oz)    Intake/Output: I/O last 3 completed shifts: In: 74 [P.O.:480; I.V.:3] Out: 1250 [Other:1250]   Intake/Output this shift:  Total I/O In: 240 [P.O.:240] Out: -   Physical Exam: General: NAD,    Head: Normocephalic, atraumatic. Moist oral mucosal membranes  Eyes: Anicteric,    Neck: Supple, trachea midline  Lungs:  Clear to auscultation  Heart: Regular rate and rhythm  Abdomen:  Soft, nontender,   Extremities: no peripheral edema.  Neurologic: Nonfocal, moving all four extremities  Skin: No lesions  GU: Right femoral hematoma  Access: Right AVF     Basic Metabolic Panel:  Recent Labs Lab 08/21/16 1610 08/26/16 0046 08/26/16 0733 08/27/16 0417  NA 134* 136 138 138  K 4.0 3.9 4.0 3.9  CL 98* 96* 100* 99*  CO2 26 26 28 30   GLUCOSE 300* 254* 243* 161*  BUN 44* 53* 58* 37*  CREATININE 6.41* 6.72* 6.92* 5.23*  CALCIUM 8.5* 8.5* 8.4* 8.5*  PHOS 5.2*  --   --  3.7    Liver Function Tests:  Recent Labs Lab 08/21/16 1610 08/26/16 0046 08/27/16 0417  AST  --  29  --   ALT  --  23  --   ALKPHOS  --  64  --   BILITOT  --  0.7  --   PROT  --  7.0  --   ALBUMIN 3.2* 3.5 3.2*   No results  for input(s): LIPASE, AMYLASE in the last 168 hours. No results for input(s): AMMONIA in the last 168 hours.  CBC:  Recent Labs Lab 08/21/16 0830 08/26/16 0046 08/26/16 0733 08/27/16 0417  WBC 6.9 9.9 8.0 8.6  NEUTROABS  --  6.6*  --   --   HGB 10.6* 10.1* 9.6* 8.7*  HCT 30.7* 29.7* 28.2* 25.0*  MCV 89.0 88.6 90.0 88.6  PLT 130* 146* 121* 123*    Cardiac Enzymes:  Recent Labs Lab 08/20/16 1513  TROPONINI 2.28*    BNP: Invalid input(s): POCBNP  CBG:  Recent Labs Lab 08/22/16 0754 08/26/16 0835 08/26/16 1709 08/26/16 2042 08/27/16 0739  GLUCAP 223* 278* 346* 74* 134*    Microbiology: Results for orders placed or performed during the hospital encounter of 08/06/16  Surgical pcr screen     Status: None   Collection Time: 08/06/16 11:53 AM  Result Value Ref Range Status   MRSA, PCR NEGATIVE NEGATIVE Final   Staphylococcus aureus NEGATIVE NEGATIVE Final    Comment:        The Xpert SA Assay (FDA approved for NASAL specimens in patients over 78 years of age), is one component of a comprehensive surveillance program.  Test  performance has been validated by [facility] for patients greater than or equal to 56 year old. It is not intended to diagnose infection nor to guide or monitor treatment.     Coagulation Studies:  Recent Labs  08/26/16 0046  LABPROT 13.8  INR 1.06    Urinalysis: No results for input(s): COLORURINE, LABSPEC, PHURINE, GLUCOSEU, HGBUR, BILIRUBINUR, KETONESUR, PROTEINUR, UROBILINOGEN, NITRITE, LEUKOCYTESUR in the last 72 hours.  Invalid input(s): APPERANCEUR    Imaging: [place] Lower Ext [employer]  Result Date: 08/26/2016 CLINICAL DATA:  Acute onset of right groin bruising blood status post recent cardiac catheterization at the right groin. EXAM: UNILATERAL RIGHT LOWER EXTREMITY ARTERIAL DUPLEX SCAN TECHNIQUE: Gray-scale sonography as well as color Doppler and duplex ultrasound was performed to evaluate the arteries of the  lower extremity. COMPARISON:  None. FINDINGS: There appears to be a focal 2.4 x 1.6 x 1.4 cm patent pseudoaneurysm at the right inguinal region, 2 cm below the skin surface, with an associated patent neck to the common femoral artery, demonstrating to and fro flow. There is also a nearby complex elongated fluid collection at the right inguinal region, measuring 5.8 x 3.9 x 1.0 cm, likely reflecting a soft tissue hematoma. IMPRESSION: 1. Focal 2.4 x 1.6 x 1.4 cm patent pseudoaneurysm noted at the right inguinal region, 2 cm below the skin surface, with an associated patent neck to the common femoral artery, demonstrating to and fro flow. 2. Complex elongated fluid collection at the right inguinal region measures 5.8 x 3.9 x 1.0 cm, likely reflecting a soft tissue hematoma. These results were called by telephone at the time of interpretation on 08/26/2016 at 3:52 am to Dr. Rudene Re, who verbally acknowledged these results. Electronically Signed   By: Garald Balding M.D.   On: 08/26/2016 03:53     Medications:   . sodium chloride     . amLODipine  10 mg Oral QPM  . atorvastatin  40 mg Oral Daily  . calcium acetate  1,334 mg Oral TID WC  . carvedilol  12.5 mg Oral BID WC  . doxazosin  8 mg Oral Daily  . insulin aspart  0-9 Units Subcutaneous TID WC  . insulin aspart  10 Units Subcutaneous TID WC  . insulin detemir  60 Units Subcutaneous QHS  . isosorbide mononitrate  30 mg Oral Daily  . lisinopril  40 mg Oral QPM  . pantoprazole  40 mg Oral Daily  . sodium chloride flush  3 mL Intravenous Q12H   sodium chloride, acetaminophen **OR** acetaminophen, indomethacin, ondansetron **OR** ondansetron (ZOFRAN) IV, senna-docusate, sodium chloride flush  Assessment/ Plan:  [name] is a 76 y.o. white male with diabetes mellitus type II insulin dependent, hypertension, hyperlipidemia, prostate cancer status post prostatectomy, right total knee, appendectomy.   MWF [facility] [place]  AVF  1. End Stage Renal Disease MWF:  Next HD tomorrow - inpatient or outpatient  2. Hypertension: blood pressure at goal.  - carvedilol, amlodipine, lisinopril, doxazosin  3. Anemia of chronic kidney disease: hemoglobin 8.7 - Mircera as outpatient.   4. Secondary Hyperparathyroidism: outpatient PTH 263 on 5/18.  - calcium acetate   5. Small rt femoral artery Pseudoaneurysm - follow vascular recommendations   LOS: 0 Teniya Filter 5/29/201811:56 AM

## 2016-08-27 NOTE — Progress Notes (Signed)
Pt returns from special. VSS, no complaints of pain, SR on telemetry, right groin dressing is clean, dry and intact. Area is soft with no change to bruise which was present on admission. I will continue to assess.

## 2016-08-27 NOTE — Progress Notes (Signed)
Inpatient Diabetes Program Recommendations  AACE/ADA: New Consensus Statement on Inpatient Glycemic Control (2015)  Target Ranges:  Prepandial:   less than 140 mg/dL      Peak postprandial:   less than 180 mg/dL (1-2 hours)      Critically ill patients:  140 - 180 mg/dL   Lab Results  Component Value Date   GLUCAP 165 (H) 08/27/2016    Review of Glycemic Control  Results for [name], Calvin Good (MRN [id number]) as of 08/27/2016 15:25  Ref. Range 08/26/2016 08:35 08/26/2016 17:09 08/26/2016 20:42 08/27/2016 07:39 08/27/2016 12:05  Glucose-Capillary Latest Ref Range: 65 - 99 mg/dL 278 (H) 346 (H) 267 (H) 134 (H) 165 (H)    Diabetes history: Type 2 Outpatient Diabetes medications: NPH 60 units qhs, Humalog 10 units tid Current orders for Inpatient glycemic control: Levemir 60 units qhs, Novolog 10 units tid, Novolog 0-9 units tid  Inpatient Diabetes Program Recommendations:   Agree with current medications for blood sugar management.   Gentry Fitz, RN, BA, MHA, CDE Diabetes Coordinator Inpatient Diabetes Program  [phone] (Team Pager) [phone] ([place]) 08/27/2016 3:27 PM

## 2016-08-27 NOTE — Progress Notes (Signed)
SATURATION QUALIFICATIONS: (This note is used to comply with regulatory documentation for home oxygen)  Patient Saturations on Room Air at Rest = 87%  Patient Saturations on Room Air while Ambulating = n/a%  Patient Saturations on 1.5 Liters of oxygen while resting = 92%  Please briefly explain why patient needs home oxygen:

## 2016-08-27 NOTE — Progress Notes (Signed)
[facility]  SUBJECTIVE: Patient reports feeling better, and wishes to be discharged. He denies pain of the hematoma. He denies chest pain.    Vitals:   08/26/16 1400 08/26/16 1405 08/26/16 1920 08/27/16 0350  BP: (!) 145/53 (!) 145/53 (!) 132/42 (!) 133/42  Pulse: 71 70 72 72  Resp: 16 16 16 18   Temp:  97.8 F (36.6 C) 98.1 F (36.7 C) 98.3 F (36.8 C)  TempSrc:  Oral Oral   SpO2:  95% 96% 96%  Weight:  106.6 kg (235 lb 0.2 oz)    Height:         Intake/Output Summary (Last 24 hours) at 08/27/16 0802 Last data filed at 08/27/16 0451  Gross per 24 hour  Intake              483 ml  Output             1250 ml  Net             -767 ml      PHYSICAL EXAM  General: Well developed, well nourished, in no acute distress HEENT:  Normocephalic and atramatic Neck:  No JVD.  Lungs: Clear bilaterally to auscultation Heart: HRRR . Normal S1 and S2 without gallops or murmurs.  Abdomen: Bowel sounds are positive, abdomen soft and non-tender  Msk:  Back normal, sitting on side of bed Extremities: ecchymosis of right groin to upper thigh, no ulceration, skin intact, nontender Neuro: Alert and oriented X 3. Psych:  Good affect, responds appropriately   LABS: Basic Metabolic Panel:  Recent Labs  08/26/16 0733 08/27/16 0417  NA 138 138  K 4.0 3.9  CL 100* 99*  CO2 28 30  GLUCOSE 243* 161*  BUN 58* 37*  CREATININE 6.92* 5.23*  CALCIUM 8.4* 8.5*  PHOS  --  3.7   Liver Function Tests:  Recent Labs  08/26/16 0046 08/27/16 0417  AST 29  --   ALT 23  --   ALKPHOS 64  --   BILITOT 0.7  --   PROT 7.0  --   ALBUMIN 3.5 3.2*   No results for input(s): LIPASE, AMYLASE in the last 72 hours. CBC:  Recent Labs  08/26/16 0046 08/26/16 0733 08/27/16 0417  WBC 9.9 8.0 8.6  NEUTROABS 6.6*  --   --   HGB 10.1* 9.6* 8.7*  HCT 29.7* 28.2* 25.0*  MCV 88.6 90.0 88.6  PLT 146* 121* 123*   Cardiac Enzymes: No results for input(s): CKTOTAL, CKMB, CKMBINDEX, TROPONINI in  the last 72 hours. BNP: Invalid input(s): POCBNP D-Dimer: No results for input(s): DDIMER in the last 72 hours. Hemoglobin A1C: No results for input(s): HGBA1C in the last 72 hours. Fasting Lipid Panel: No results for input(s): CHOL, HDL, LDLCALC, TRIG, CHOLHDL, LDLDIRECT in the last 72 hours. Thyroid Function Tests: No results for input(s): TSH, T4TOTAL, T3FREE, THYROIDAB in the last 72 hours.  Invalid input(s): FREET3 Anemia Panel: No results for input(s): VITAMINB12, FOLATE, FERRITIN, TIBC, IRON, RETICCTPCT in the last 72 hours.  [place] Lower Ext Art Right Ltd  Result Date: 08/26/2016 CLINICAL DATA:  Acute onset of right groin bruising blood status post recent cardiac catheterization at the right groin. EXAM: UNILATERAL RIGHT LOWER EXTREMITY ARTERIAL DUPLEX SCAN TECHNIQUE: Gray-scale sonography as well as color Doppler and duplex ultrasound was performed to evaluate the arteries of the lower extremity. COMPARISON:  None. FINDINGS: There appears to be a focal 2.4 x 1.6 x 1.4 cm patent pseudoaneurysm at the right  inguinal region, 2 cm below the skin surface, with an associated patent neck to the common femoral artery, demonstrating to and fro flow. There is also a nearby complex elongated fluid collection at the right inguinal region, measuring 5.8 x 3.9 x 1.0 cm, likely reflecting a soft tissue hematoma. IMPRESSION: 1. Focal 2.4 x 1.6 x 1.4 cm patent pseudoaneurysm noted at the right inguinal region, 2 cm below the skin surface, with an associated patent neck to the common femoral artery, demonstrating to and fro flow. 2. Complex elongated fluid collection at the right inguinal region measures 5.8 x 3.9 x 1.0 cm, likely reflecting a soft tissue hematoma. These results were called by telephone at the time of interpretation on 08/26/2016 at 3:52 am to Dr. Rudene Re, who verbally acknowledged these results. Electronically Signed   By: Garald Balding M.D.   On: 08/26/2016 03:53     Echo  60-65%  TELEMETRY: NSR, 71 bpm  ASSESSMENT AND PLAN:  Active Problems:   Pseudoaneurysm following procedure ([place])    1. Right femoral artery pseudoaneurysm and hematoma, likely to resolve on own without intervention, per vascular consult 2. Two-vessel coronary artery disease, stable, no chest pain 3. End-stage renal disease on HD  Recommendations: 1. Agree with current therapy 2. Continue to hold anticoagulation for now 3. Follow-up as outpatient in about 1 week  Sign off for now; call with any questions.  Clabe Seal, PA-C 08/27/2016 8:02 AM

## 2016-08-27 NOTE — Care Management (Signed)
Patient has qualified for home oxygen.  Went to discuss with patient and vascular was speaking with patient about necessary intervention for his hematoma and pseudoaneurysm.  Patient has qualified for home oxygen.  Notified attending and requested order. Went to speak with patient about need for home 02 and he had already been taken off the unit for the procedure .  Vascular was still on the unit entering orders for the procedure. heads up referral to [facility].  Anticipate discharge 5/30 if all goes well.

## 2016-08-27 NOTE — Progress Notes (Signed)
[place] [facility]                                                                                                                                                                                  Patient Demographics   Calvin Good, is a 76 y.o. 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 Patient returns back with right groin discoloration. Noted to have some hematoma as well as pseudoaneurysm.   c/o shortness of breath on exertion.  Review of Systems:   CONSTITUTIONAL: No documented fever. No fatigue, weakness. No weight gain, no weight loss.  EYES: No blurry or double vision.  ENT: No tinnitus. No postnasal drip. No redness of the oropharynx.  RESPIRATORY: No cough, no wheeze, no hemoptysis. No dyspnea.  CARDIOVASCULAR: No chest pain. No orthopnea. No palpitations. No syncope.  GASTROINTESTINAL: No nausea, no vomiting or diarrhea. No abdominal pain. No melena or hematochezia.  GENITOURINARY: No dysuria or hematuria.  ENDOCRINE: No polyuria or nocturia. No heat or cold intolerance.  HEMATOLOGY: No anemia. No bruising. No bleeding.  INTEGUMENTARY: Right groin discoloration  MUSCULOSKELETAL: No arthritis. No swelling. No gout.  NEUROLOGIC: No numbness, tingling, or ataxia. No seizure-type activity.  PSYCHIATRIC: No anxiety. No insomnia. No ADD.    Vitals:   Vitals:   08/26/16 1405 08/26/16 1920 08/27/16 0350 08/27/16 1207  BP: (!) 145/53 (!) 132/42 (!) 133/42 (!) 132/44  Pulse: 70 72 72 72  Resp: 16 16 18 18   Temp: 97.8 F (36.6 C) 98.1 F (36.7 C) 98.3 F (36.8 C) 98.2 F (36.8 C)  TempSrc: Oral Oral  Oral  SpO2: 95% 96% 96% 90%  Weight:  106.6 kg (235 lb 0.2 oz)     Height:        Wt Readings from Last 3 Encounters:  08/26/16 106.6 kg (235 lb 0.2 oz)  08/22/16 103.1 kg (227 lb 3.2 oz)  08/01/16 104.3 kg (230 lb)     Intake/Output Summary (Last 24 hours) at 08/27/16 1335 Last data filed at 08/27/16 0944  Gross per 24 hour  Intake              483 ml  Output             1250 ml  Net             -767 ml  Physical Exam:   GENERAL: Pleasant-appearing in no apparent distress.  HEAD, EYES, EARS, NOSE AND THROAT: Atraumatic, normocephalic. Extraocular muscles are intact. Pupils equal and reactive to light. Sclerae anicteric. No conjunctival injection. No oro-pharyngeal erythema.  NECK: Supple. There is no jugular venous distention. No bruits, no lymphadenopathy, no thyromegaly.  HEART: Regular rate and rhythm,. No murmurs, no rubs, no clicks.  LUNGS: Clear to auscultation bilaterally. No rales or rhonchi. No wheezes.  ABDOMEN: Soft, flat, nontender, nondistended. Has good bowel sounds. No hepatosplenomegaly appreciated.  EXTREMITIES: Right groin hematoma and discoloration NEUROLOGIC: The patient is alert, awake, and oriented x3 with no focal motor or sensory deficits appreciated bilaterally.  SKIN: Moist and warm with no rashes appreciated.  Psych: Not anxious, depressed LN: No inguinal LN enlargement    Antibiotics   Anti-infectives    None      Medications   Scheduled Meds: . amLODipine  10 mg Oral QPM  . atorvastatin  40 mg Oral Daily  . calcium acetate  1,334 mg Oral TID WC  . carvedilol  12.5 mg Oral BID WC  . doxazosin  8 mg Oral Daily  . insulin aspart  0-9 Units Subcutaneous TID WC  . insulin aspart  10 Units Subcutaneous TID WC  . insulin detemir  60 Units Subcutaneous QHS  . isosorbide mononitrate  30 mg Oral Daily  . lisinopril  40 mg Oral QPM  . pantoprazole  40 mg Oral Daily  . sodium chloride flush  3 mL Intravenous Q12H   Continuous Infusions: . sodium chloride     PRN Meds:.sodium  chloride, acetaminophen **OR** acetaminophen, indomethacin, ondansetron **OR** ondansetron (ZOFRAN) IV, senna-docusate, sodium chloride flush   Data Review:   Micro Results No results found for this or any previous visit (from the past 240 hour(s)).  Radiology Reports [place]  Result Date: 08/27/2016 CLINICAL DATA:  History of cardiac catheterization. Follow-up right groin pseudoaneurysm. EXAM: UNILATERAL RIGHT LOWER EXTREMITY ARTERIAL DUPLEX SCAN TECHNIQUE: Gray-scale sonography as well as color Doppler and duplex ultrasound was performed to evaluate the arteries of the lower extremity. COMPARISON:  08/26/2016 FINDINGS: Images were obtained in the right groin. There continues to be evidence for a right groin pseudoaneurysm with a narrow neck. Neck measures roughly 0.3 cm in diameter. The pseudoaneurysm measures 2.5 x 1.6 x 2.4 cm and previously measured 2.4 x 1.6 x 1.4 cm. Right femoral arteries in the right groin remain patent. There continues to be a heterogeneous hematoma in the right groin. Hematoma appears larger than the recent comparison examination. The hematoma measures roughly 8.5 x 2.4 x 6.2 cm and previously measured 5.8 x 3.9 x 1.0 cm. IMPRESSION: Right groin pseudoaneurysm has slightly enlarged in size. There continues to be a neck between a right femoral artery and the pseudo aneurysm. Enlargement of the right groin hematoma. Electronically Signed   By: Markus Daft M.D.   On: 08/27/2016 13:10   [place] Lower [employer]  Result Date: 08/26/2016 CLINICAL DATA:  Acute onset of right groin bruising blood status post recent cardiac catheterization at the right groin. EXAM: UNILATERAL RIGHT LOWER EXTREMITY ARTERIAL DUPLEX SCAN TECHNIQUE: Gray-scale sonography as well as color Doppler and duplex ultrasound was performed to evaluate the arteries of the lower extremity. COMPARISON:  None. FINDINGS: There appears to be a focal 2.4 x 1.6 x 1.4 cm patent pseudoaneurysm at the  right inguinal region, 2 cm below the skin surface, with an associated patent neck to  the common femoral artery, demonstrating to and fro flow. There is also a nearby complex elongated fluid collection at the right inguinal region, measuring 5.8 x 3.9 x 1.0 cm, likely reflecting a soft tissue hematoma. IMPRESSION: 1. Focal 2.4 x 1.6 x 1.4 cm patent pseudoaneurysm noted at the right inguinal region, 2 cm below the skin surface, with an associated patent neck to the common femoral artery, demonstrating to and fro flow. 2. Complex elongated fluid collection at the right inguinal region measures 5.8 x 3.9 x 1.0 cm, likely reflecting a soft tissue hematoma. These results were called by telephone at the time of interpretation on 08/26/2016 at 3:52 am to Dr. Rudene Re, who verbally acknowledged these results. Electronically Signed   By: Garald Balding M.D.   On: 08/26/2016 03:53   Dg Chest Portable 1 View  Result Date: 08/19/2016 CLINICAL DATA:  Acute onset of midsternal chest pain. Initial encounter. EXAM: PORTABLE CHEST 1 VIEW COMPARISON:  None. FINDINGS: The lungs are well-aerated. Mild bibasilar opacities may reflect mild interstitial edema or atelectasis. There is no evidence of pleural effusion or pneumothorax. The cardiomediastinal silhouette is borderline enlarged. No acute osseous abnormalities are seen. A right-sided dual-lumen catheter is noted ending about the cavoatrial junction. IMPRESSION: Mild bibasilar airspace opacities may reflect mild interstitial edema or atelectasis. Borderline cardiomegaly. Electronically Signed   By: Garald Balding M.D.   On: 08/19/2016 22:50     CBC  Recent Labs Lab 08/21/16 0830 08/26/16 0046 08/26/16 0733 08/27/16 0417  WBC 6.9 9.9 8.0 8.6  HGB 10.6* 10.1* 9.6* 8.7*  HCT 30.7* 29.7* 28.2* 25.0*  PLT 130* 146* 121* 123*  MCV 89.0 88.6 90.0 88.6  MCH 30.7 30.2 30.7 30.8  MCHC 34.5 34.1 34.1 34.7  RDW 14.5 14.4 14.7* 14.4  LYMPHSABS  --  2.1  --   --    MONOABS  --  0.7  --   --   EOSABS  --  0.4  --   --   BASOSABS  --  0.1  --   --     Chemistries   Recent Labs Lab 08/21/16 1610 08/26/16 0046 08/26/16 0733 08/27/16 0417  NA 134* 136 138 138  K 4.0 3.9 4.0 3.9  CL 98* 96* 100* 99*  CO2 26 26 28 30   GLUCOSE 300* 254* 243* 161*  BUN 44* 53* 58* 37*  CREATININE 6.41* 6.72* 6.92* 5.23*  CALCIUM 8.5* 8.5* 8.4* 8.5*  AST  --  29  --   --   ALT  --  23  --   --   ALKPHOS  --  64  --   --   BILITOT  --  0.7  --   --    ------------------------------------------------------------------------------------------------------------------ estimated creatinine clearance is 14 mL/min (A) (by C-G formula based on SCr of 5.23 mg/dL (H)). ------------------------------------------------------------------------------------------------------------------ No results for input(s): HGBA1C in the last 72 hours. ------------------------------------------------------------------------------------------------------------------ No results for input(s): CHOL, HDL, LDLCALC, TRIG, CHOLHDL, LDLDIRECT in the last 72 hours. ------------------------------------------------------------------------------------------------------------------ No results for input(s): TSH, T4TOTAL, T3FREE, THYROIDAB in the last 72 hours.  Invalid input(s): FREET3 ------------------------------------------------------------------------------------------------------------------ No results for input(s): VITAMINB12, FOLATE, FERRITIN, TIBC, IRON, RETICCTPCT in the last 72 hours.  Coagulation profile  Recent Labs Lab 08/20/16 2229 08/26/16 0046  INR 1.11 1.06    No results for input(s): DDIMER in the last 72 hours.  Cardiac Enzymes  Recent Labs Lab 08/20/16 1513  TROPONINI 2.28*   ------------------------------------------------------------------------------------------------------------------ Invalid input(s): POCBNP    Assessment &  Plan  Patient is a 76 year old  with recent cardiac catheterization 1. Right inguinal hematoma and right inguinal pseuoaneursm  In by vascular, ultrasound yesterday, today . today ultrasound showed slight increase in size. According to vascular note,from 5/28/he may need  US guided compression. Or thrombin  Injection.Will Discuss with vascular for final intervention and disposition plan.  2. End-stage renal disease patient will have hemodialysis and a, Wednesday, Friday.  3. Type 2 diabetes meters continue sliding scale insulin and Levemir 4. Coronary artery disease continue Coreg and Imdur 5. Hyperlipidemia unspecified continue Lipitor 6. Essential hypertension continue therapy with amlodipine and Coreg and Cardura 7. Miscellaneous SCDs for DVT prophylaxis #8 shortness of breath on exertion: Check 6 minute  Walking  challenge test to see if he qualifies for home oxygen D/w RN.    Code Status Orders        Start     Ordered   08/26/16 0645  Full code  Continuous     08/26/16 0644    Code Status History    Date Active Date Inactive Code Status Order ID Comments User Context   08/20/2016  3:05 AM 08/22/2016  1:01 PM Full Code [id number]  Lance Coon, MD ED    Advance Directive Documentation     Most Recent Value  Type of Advance Directive  Healthcare Power of Attorney  Pre-existing out of facility DNR order (yellow form or pink MOST form)  -  "MOST" Form in Place?  -           Consults  Cadriology, vascular, nephrology  DVT Prophylaxis  scd's  Lab Results  Component Value Date   PLT 123 (L) 08/27/2016     Time Spent in minutes  14min Greater than 50% of time spent in care coordination and counseling patient regarding the condition and plan of care.   Epifanio Lesches M.D on 08/27/2016 at 1:35 PM  Between 7am to 6pm - Pager - [phone]  After 6pm go to [email or web address] - password [email or web address]   Office  [phone]

## 2016-08-28 ENCOUNTER — Observation Stay: Payer: Medicare Other

## 2016-08-28 ENCOUNTER — Encounter: Payer: Self-pay | Admitting: Vascular Surgery

## 2016-08-28 DIAGNOSIS — Z7982 Long term (current) use of aspirin: Secondary | ICD-10-CM | POA: Diagnosis not present

## 2016-08-28 DIAGNOSIS — I729 Aneurysm of unspecified site: Secondary | ICD-10-CM | POA: Diagnosis present

## 2016-08-28 DIAGNOSIS — Z96651 Presence of right artificial knee joint: Secondary | ICD-10-CM | POA: Diagnosis present

## 2016-08-28 DIAGNOSIS — N2581 Secondary hyperparathyroidism of renal origin: Secondary | ICD-10-CM | POA: Diagnosis present

## 2016-08-28 DIAGNOSIS — I132 Hypertensive heart and chronic kidney disease with heart failure and with stage 5 chronic kidney disease, or end stage renal disease: Secondary | ICD-10-CM | POA: Diagnosis present

## 2016-08-28 DIAGNOSIS — E785 Hyperlipidemia, unspecified: Secondary | ICD-10-CM | POA: Diagnosis present

## 2016-08-28 DIAGNOSIS — R0902 Hypoxemia: Secondary | ICD-10-CM | POA: Diagnosis present

## 2016-08-28 DIAGNOSIS — J449 Chronic obstructive pulmonary disease, unspecified: Secondary | ICD-10-CM | POA: Diagnosis present

## 2016-08-28 DIAGNOSIS — I252 Old myocardial infarction: Secondary | ICD-10-CM | POA: Diagnosis not present

## 2016-08-28 DIAGNOSIS — N186 End stage renal disease: Secondary | ICD-10-CM | POA: Diagnosis present

## 2016-08-28 DIAGNOSIS — Z809 Family history of malignant neoplasm, unspecified: Secondary | ICD-10-CM | POA: Diagnosis not present

## 2016-08-28 DIAGNOSIS — Z885 Allergy status to narcotic agent status: Secondary | ICD-10-CM | POA: Diagnosis not present

## 2016-08-28 DIAGNOSIS — R229 Localized swelling, mass and lump, unspecified: Secondary | ICD-10-CM | POA: Diagnosis present

## 2016-08-28 DIAGNOSIS — Z8546 Personal history of malignant neoplasm of prostate: Secondary | ICD-10-CM | POA: Diagnosis not present

## 2016-08-28 DIAGNOSIS — I724 Aneurysm of artery of lower extremity: Secondary | ICD-10-CM | POA: Diagnosis present

## 2016-08-28 DIAGNOSIS — I509 Heart failure, unspecified: Secondary | ICD-10-CM | POA: Diagnosis present

## 2016-08-28 DIAGNOSIS — K219 Gastro-esophageal reflux disease without esophagitis: Secondary | ICD-10-CM | POA: Diagnosis present

## 2016-08-28 DIAGNOSIS — I2510 Atherosclerotic heart disease of native coronary artery without angina pectoris: Secondary | ICD-10-CM | POA: Diagnosis present

## 2016-08-28 DIAGNOSIS — Z881 Allergy status to other antibiotic agents status: Secondary | ICD-10-CM | POA: Diagnosis not present

## 2016-08-28 DIAGNOSIS — Z992 Dependence on renal dialysis: Secondary | ICD-10-CM | POA: Diagnosis not present

## 2016-08-28 DIAGNOSIS — Z87891 Personal history of nicotine dependence: Secondary | ICD-10-CM | POA: Diagnosis not present

## 2016-08-28 DIAGNOSIS — Z794 Long term (current) use of insulin: Secondary | ICD-10-CM | POA: Diagnosis not present

## 2016-08-28 DIAGNOSIS — D631 Anemia in chronic kidney disease: Secondary | ICD-10-CM | POA: Diagnosis present

## 2016-08-28 DIAGNOSIS — E1122 Type 2 diabetes mellitus with diabetic chronic kidney disease: Secondary | ICD-10-CM | POA: Diagnosis present

## 2016-08-28 LAB — GLUCOSE, CAPILLARY
Glucose-Capillary: 186 mg/dL — ABNORMAL HIGH (ref 65–99)
Glucose-Capillary: 113 mg/dL — ABNORMAL HIGH (ref 65–99)

## 2016-08-28 NOTE — Discharge Instructions (Signed)
Femoral Site Care Refer to this sheet in the next few weeks. These instructions provide you with information about caring for yourself after your procedure. Your health care provider may also give you more specific instructions. Your treatment has been planned according to current medical practices, but problems sometimes occur. Call your health care provider if you have any problems or questions after your procedure. What can I expect after the procedure? After your procedure, it is typical to have the following:  Bruising at the site that usually fades within 1-2 weeks.  Blood collecting in the tissue (hematoma) that may be painful to the touch. It should usually decrease in size and tenderness within 1-2 weeks. Follow these instructions at home:  Take medicines only as directed by your health care provider.  You may shower 24-48 hours after the procedure or as directed by your health care provider. Remove the bandage (dressing) and gently wash the site with plain soap and water. Pat the area dry with a clean towel. Do not rub the site, because this may cause bleeding.  Do not take baths, swim, or use a hot tub until your health care provider approves.  Check your insertion site every day for redness, swelling, or drainage.  Do not apply powder or lotion to the site.  Limit use of stairs to twice a day for the first 2-3 days or as directed by your health care provider.  Do not squat for the first 2-3 days or as directed by your health care provider.  Do not lift over 10 lb (4.5 kg) for 5 days after your procedure or as directed by your health care provider.  Ask your health care provider when it is okay to:  Return to work or school.  Resume usual physical activities or sports.  Resume sexual activity.  Do not drive home if you are discharged the same day as the procedure. Have someone else drive you.  You may drive 24 hours after the procedure unless otherwise instructed by  your health care provider.  Do not operate machinery or power tools for 24 hours after the procedure or as directed by your health care provider.  If your procedure was done as an outpatient procedure, which means that you went home the same day as your procedure, a responsible adult should be with you for the first 24 hours after you arrive home.  Keep all follow-up visits as directed by your health care provider. This is important. Contact a health care provider if:  You have a fever.  You have chills.  You have increased bleeding from the site. Hold pressure on the site. Get help right away if:  You have unusual pain at the site.  You have redness, warmth, or swelling at the site.  You have drainage (other than a small amount of blood on the dressing) from the site.  The site is bleeding, and the bleeding does not stop after 30 minutes of holding steady pressure on the site.  Your leg or foot becomes pale, cool, tingly, or numb. This information is not intended to replace advice given to you by your health care provider. Make sure you discuss any questions you have with your health care provider. Document Released: 11/19/2013 Document Revised: 08/24/2015 Document Reviewed: 10/05/2013 Elsevier Interactive Patient Education  2017 [employer]   Pseudoaneurysm An aneurysm is a bulge in an artery. A pseudoaneurysm happens when an artery is injured and blood leaks out to form a sac-like bulge. The  bulge can break open, causing bleeding in the nearby tissues. What are the causes? The most common cause of this condition is a procedure such as an angiogram in which a thin tube (catheter) is inserted into an artery. After an angiogram, the insertion site on the artery should close back up all the way. If it does not, blood may leak out of the artery. Other causes of a pseudoaneurysminclude:  Trauma to the walls of an artery, such as from a stabbing injury or a deep cut.  Bypass  artery grafting surgery, which is a type of surgery that makes blood flow to the heart better.  An infection that affects the walls of an artery.  A heart attack (myocardial infarction).  An aneurysm that bursts (ruptures). What are the signs or symptoms? Symptoms of this condition include:  Pain, soreness, or tenderness at the site of the pseudoaneurysm.  Swelling.  Bruising or a change in skin color.  A throbbing mass or lump at the site. How is this diagnosed? This condition may be diagnosed based on your symptoms, a physical exam, and an imaging test called a Doppler ultrasound. This imaging test uses sound waves to show the blood flow in the arteries and the pseudoaneurysm. How is this treated? This condition may go away on its own without treatment. To help prevent bleeding that cannot be controlled, or to help prevent other problems, your health care provider may suggest one of these treatments:  Injecting a blood-clotting enzyme, such as thrombin, into the site.  Fixing the artery with surgery.  Putting pressure (compression) on the pseudoaneurysm. Follow these instructions at home:  Take over-the-counter and prescription medicines only as told by your health care provider.  Return to your normal activities as told by your health care provider. Ask your health care provider what activities are safe for you.  Keep all follow-up visits as told by your health care provider. This is important. Contact a health care provider if:  Your pain, soreness, or tenderness at the pseudoaneurysm site keeps getting worse.  You have swelling at the site. Get help right away if:  You have severe or ongoing (persistent) pain at the site of the pseudoaneurysm.  There is bleeding or drainage from the site.  The part of your body where the pseudoaneurysm is located changes color or becomes painful, cold, or numb.  You have chest pain or shortness of breath.  You feel like you might  faint or you faint. This information is not intended to replace advice given to you by your health care provider. Make sure you discuss any questions you have with your health care provider. Document Released: 09/04/2007 Document Revised: 12/29/2015 Document Reviewed: 12/29/2015 Elsevier Interactive Patient Education  2017 [employer].

## 2016-08-28 NOTE — Care Management (Signed)
Continue to anticipate need for home oxygen and patient in agreement.  No agency preference.  Referral to [facility].  Patient is in agreement  with home health nurse. No agency preference. Heads up referral to [facility].  Patient leaves his home on [address] around 11A for his dialysis treatments.

## 2016-08-28 NOTE — Progress Notes (Signed)
Patient discharged via wheelchair and private vehicle. IV removed and catheter intact. All discharge instructions given and patient verbalizes understanding. Tele removed and returned. No prescriptions given to patient No distress noted. All discharge instructions reviewed with patients daughter over the phone she verbalized understanding

## 2016-08-28 NOTE — Care Management (Signed)
Patient continues with hypoxia due to his CHF despite changes in cardiac meds and dialysis

## 2016-08-28 NOTE — Progress Notes (Signed)
[facility]  ROUNDING NOTE   Subjective:   Admitted for Groin swelling [R19.09] Pseudoaneurysm of right femoral artery (HCC) [I72.4] Hematoma of groin, initial encounter [S30.1XXA]   Underwent HD earlier today 1.5 L removed Thrombin injected by Dr Delana Meyer over pseudoaneurysm, site  Objective:  Vital signs in last 24 hours:  Temp:  [97.7 F (36.5 C)-98.1 F (36.7 C)] 98.1 F (36.7 C) (05/30 1400) Pulse Rate:  [66-83] 83 (05/30 1400) Resp:  [8-19] 18 (05/30 1400) BP: (134-162)/(42-137) 150/137 (05/30 1400) SpO2:  [93 %-98 %] 97 % (05/30 1335) Weight:  [105.9 kg (233 lb 7.5 oz)-109.4 kg (241 lb 2.9 oz)] 108 kg (238 lb 1.6 oz) (05/30 1335)  Weight change: -0.272 kg (-9.6 oz) Filed Weights   08/28/16 0419 08/28/16 0950 08/28/16 1335  Weight: 105.9 kg (233 lb 7.5 oz) 109.4 kg (241 lb 2.9 oz) 108 kg (238 lb 1.6 oz)    Intake/Output: I/O last 3 completed shifts: In: 40 [P.O.:480; I.V.:3] Out: 0    Intake/Output this shift:  Total I/O In: -  Out: 1500 [Other:1500]  Physical Exam: General: NAD,    Head: Normocephalic, atraumatic. Moist oral mucosal membranes  Eyes: Anicteric,    Neck: Supple, trachea midline  Lungs:  Clear to auscultation  Heart: Regular rate and rhythm  Abdomen:  Soft, nontender,   Extremities: no peripheral edema.  Neurologic: Nonfocal, moving all four extremities  Skin: No lesions  GU: Right femoral hematoma  Access: Left upper arm AVF     Basic Metabolic Panel:  Recent Labs Lab 08/26/16 0046 08/26/16 0733 08/27/16 0417  NA 136 138 138  K 3.9 4.0 3.9  CL 96* 100* 99*  CO2 26 28 30   GLUCOSE 254* 243* 161*  BUN 53* 58* 37*  CREATININE 6.72* 6.92* 5.23*  CALCIUM 8.5* 8.4* 8.5*  PHOS  --   --  3.7    Liver Function Tests:  Recent Labs Lab 08/26/16 0046 08/27/16 0417  AST 29  --   ALT 23  --   ALKPHOS 64  --   BILITOT 0.7  --   PROT 7.0  --   ALBUMIN 3.5 3.2*   No results for input(s): LIPASE, AMYLASE in the  last 168 hours. No results for input(s): AMMONIA in the last 168 hours.  CBC:  Recent Labs Lab 08/26/16 0046 08/26/16 0733 08/27/16 0417  WBC 9.9 8.0 8.6  NEUTROABS 6.6*  --   --   HGB 10.1* 9.6* 8.7*  HCT 29.7* 28.2* 25.0*  MCV 88.6 90.0 88.6  PLT 146* 121* 123*    Cardiac Enzymes: No results for input(s): CKTOTAL, CKMB, CKMBINDEX, TROPONINI in the last 168 hours.  BNP: Invalid input(s): POCBNP  CBG:  Recent Labs Lab 08/27/16 1205 08/27/16 1713 08/27/16 2047 08/28/16 0756 08/28/16 1405  GLUCAP 165* 131* 155* 186* 113*    Microbiology: Results for orders placed or performed during the hospital encounter of 08/06/16  Surgical pcr screen     Status: None   Collection Time: 08/06/16 11:53 AM  Result Value Ref Range Status   MRSA, PCR NEGATIVE NEGATIVE Final   Staphylococcus aureus NEGATIVE NEGATIVE Final    Comment:        The Xpert SA Assay (FDA approved for NASAL specimens in patients over 62 years of age), is one component of a comprehensive surveillance program.  Test performance has been validated by [facility] for patients greater than or equal to 21 year old. It is not intended to diagnose infection  nor to guide or monitor treatment.     Coagulation Studies:  Recent Labs  08/26/16 0046  LABPROT 13.8  INR 1.06    Urinalysis: No results for input(s): COLORURINE, LABSPEC, PHURINE, GLUCOSEU, HGBUR, BILIRUBINUR, KETONESUR, PROTEINUR, UROBILINOGEN, NITRITE, LEUKOCYTESUR in the last 72 hours.  Invalid input(s): APPERANCEUR    Imaging: [place] [employer]  Result Date: 08/28/2016 CLINICAL DATA:  History of pseudo aneurysm, post thrombin injection. EXAM: UNILATERAL RIGHT LOWER EXTREMITY ARTERIAL DUPLEX SCAN TECHNIQUE: Gray-scale sonography as well as color Doppler and duplex ultrasound was performed to evaluate the arteries of the lower extremity. COMPARISON:  Unilateral right lower extremity arterial duplex ultrasound - 08/27/2016;  08/26/2016 FINDINGS: No flow is demonstrated within previously identified right groin pseudoaneurysm. The aneurysm appears to have decreased in size the interval, currently measuring 1.7 x 1.8 x 1.7 cm, previously, 2.5 x 1.6 x 2.4 cm. Previous identified pseudoaneurysm neck is no longer visualized. The adjacent right femoral vein and artery appear widely patent. Adjacent ill-defined hematoma is also likely minimally decreased in size the interval, currently measuring 7.4 x 3.3 x 2.5 cm, previously, 8.5 x 2.4 x 6.2 cm IMPRESSION: 1. 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 The now thrombosed pseudoaneurysm has also decreased in size, currently measuring 1.8 cm, previously, 2.5 cm. 2. Slight decrease in size of ill-defined right groin hematoma currently measuring 7.4 cm, previously, 8.5 cm. Electronically Signed   By: Sandi Mariscal M.D.   On: 08/28/2016 15:10   [place] [employer]  Result Date: 08/27/2016 CLINICAL DATA:  History of cardiac catheterization. Follow-up right groin pseudoaneurysm. EXAM: UNILATERAL RIGHT LOWER EXTREMITY ARTERIAL DUPLEX SCAN TECHNIQUE: Gray-scale sonography as well as color Doppler and duplex ultrasound was performed to evaluate the arteries of the lower extremity. COMPARISON:  08/26/2016 FINDINGS: Images were obtained in the right groin. There continues to be evidence for a right groin pseudoaneurysm with a narrow neck. Neck measures roughly 0.3 cm in diameter. The pseudoaneurysm measures 2.5 x 1.6 x 2.4 cm and previously measured 2.4 x 1.6 x 1.4 cm. Right femoral arteries in the right groin remain patent. There continues to be a heterogeneous hematoma in the right groin. Hematoma appears larger than the recent comparison examination. The hematoma measures roughly 8.5 x 2.4 x 6.2 cm and previously measured 5.8 x 3.9 x 1.0 cm. IMPRESSION: Right groin pseudoaneurysm has slightly enlarged in size.  There continues to be a neck between a right femoral artery and the pseudo aneurysm. Enlargement of the right groin hematoma. Electronically Signed   By: Markus Daft M.D.   On: 08/27/2016 13:10     Medications:   . sodium chloride     . amLODipine  10 mg Oral QPM  . atorvastatin  40 mg Oral Daily  . calcium acetate  1,334 mg Oral TID WC  . carvedilol  12.5 mg Oral BID WC  . doxazosin  8 mg Oral Daily  . insulin aspart  0-9 Units Subcutaneous TID WC  . insulin aspart  10 Units Subcutaneous TID WC  . insulin detemir  60 Units Subcutaneous QHS  . isosorbide mononitrate  30 mg Oral Daily  . lidocaine-prilocaine   Topical UD  . lisinopril  40 mg Oral QPM  . pantoprazole  40 mg Oral Daily  . sodium chloride flush  3 mL Intravenous Q12H  . thrombin  20,000 Units Topical Once   sodium chloride, acetaminophen **OR** acetaminophen, indomethacin,  ondansetron **OR** ondansetron (ZOFRAN) IV, senna-docusate, sodium chloride flush  Assessment/ Plan:  Mr. Varick Keys is a 76 y.o. white male with diabetes mellitus type II insulin dependent, hypertension, hyperlipidemia, prostate cancer status post prostatectomy, right total knee, appendectomy.   MWF [facility] AVF  1. End Stage Renal Disease MWF:  1.5 L removed with HD today  2. Hypertension: blood pressure at goal.  - carvedilol, amlodipine, lisinopril, doxazosin  3. Anemia of chronic kidney disease: hemoglobin 8.7 - Mircera as outpatient.   4. Secondary Hyperparathyroidism: outpatient PTH 263 on 5/18.  - calcium acetate   5. Small rt femoral artery Pseudoaneurysm - follow vascular recommendations   LOS: 0 Jaycee Pelzer 5/30/20184:50 PM

## 2016-08-28 NOTE — Progress Notes (Signed)
[place] [facility]                                                                                                                                                                                  Patient Demographics   Nasir Bright, is a 76 y.o. male, DOB - 1940-11-11, NLG:[id number]  Admit date - 08/26/2016   Admitting Physician Saundra Shelling, MD  Outpatient Primary MD for the patient is [name], MD   LOS - 0  Subjective: Status post . Thrombin injection for  pseudoaneurysm yesterday. Seen during hemodialysis today, he feels better, no shortness of breath, no groin pain,right  groin hematoma is fading away.  Review of Systems:   CONSTITUTIONAL: No documented fever. No fatigue, weakness. No weight gain, no weight loss.  EYES: No blurry or double vision.  ENT: No tinnitus. No postnasal drip. No redness of the oropharynx.  RESPIRATORY: No cough, no wheeze, no hemoptysis. No dyspnea.  CARDIOVASCULAR: No chest pain. No orthopnea. No palpitations. No syncope.  GASTROINTESTINAL: No nausea, no vomiting or diarrhea. No abdominal pain. No melena or hematochezia.  GENITOURINARY: No dysuria or hematuria.  ENDOCRINE: No polyuria or nocturia. No heat or cold intolerance.  HEMATOLOGY: No anemia. No bruising. No bleeding.  INTEGUMENTARY: Right groin discoloration  MUSCULOSKELETAL: No arthritis. No swelling. No gout.  NEUROLOGIC: No numbness, tingling, or ataxia. No seizure-type activity.  PSYCHIATRIC: No anxiety. No insomnia. No ADD.    Vitals:   Vitals:   08/28/16 1230 08/28/16 1300 08/28/16 1335 08/28/16 1400  BP: (!) 143/52 (!) 138/51 (!) 162/63 (!) 150/137  Pulse: 66 68 73 83  Resp: 16 19 (!) 8 18  Temp:   97.9 F (36.6 C) 98.1 F (36.7 C)  TempSrc:   Oral Oral  SpO2:  98% 97%   Weight:   108 kg (238 lb 1.6 oz)   Height:        Wt Readings from Last 3 Encounters:  08/28/16 108 kg (238 lb 1.6 oz)  08/22/16 103.1 kg (227 lb 3.2 oz)   08/01/16 104.3 kg (230 lb)     Intake/Output Summary (Last 24 hours) at 08/28/16 1408 Last data filed at 08/28/16 1335  Gross per 24 hour  Intake              240 ml  Output             1500 ml  Net            -1260 ml    Physical Exam:   GENERAL: Pleasant-appearing in no apparent distress.  HEAD, EYES, EARS, NOSE AND THROAT: Atraumatic, normocephalic. Extraocular muscles are intact.  Pupils equal and reactive to light. Sclerae anicteric. No conjunctival injection. No oro-pharyngeal erythema.  NECK: Supple. There is no jugular venous distention. No bruits, no lymphadenopathy, no thyromegaly.  HEART: Regular rate and rhythm,. No murmurs, no rubs, no clicks.  LUNGS: Clear to auscultation bilaterally. No rales or rhonchi. No wheezes.  ABDOMEN: Soft, flat, nontender, nondistended. Has good bowel sounds. No hepatosplenomegaly appreciated.  EXTREMITIES: Right groin hematoma and discoloration,fading  Less blue NEUROLOGIC: The patient is alert, awake, and oriented x3 with no focal motor or sensory deficits appreciated bilaterally.  SKIN: Moist and warm with no rashes appreciated.  Psych: Not anxious, depressed LN: No inguinal LN enlargement    Antibiotics   Anti-infectives    None      Medications   Scheduled Meds: . amLODipine  10 mg Oral QPM  . atorvastatin  40 mg Oral Daily  . calcium acetate  1,334 mg Oral TID WC  . carvedilol  12.5 mg Oral BID WC  . doxazosin  8 mg Oral Daily  . insulin aspart  0-9 Units Subcutaneous TID WC  . insulin aspart  10 Units Subcutaneous TID WC  . insulin detemir  60 Units Subcutaneous QHS  . isosorbide mononitrate  30 mg Oral Daily  . lidocaine-prilocaine   Topical UD  . lisinopril  40 mg Oral QPM  . pantoprazole  40 mg Oral Daily  . sodium chloride flush  3 mL Intravenous Q12H  . thrombin  20,000 Units Topical Once   Continuous Infusions: . sodium chloride     PRN Meds:.sodium chloride, acetaminophen **OR** acetaminophen, indomethacin,  ondansetron **OR** ondansetron (ZOFRAN) IV, senna-docusate, sodium chloride flush   Data Review:   Micro Results No results found for this or any previous visit (from the past 240 hour(s)).  Radiology Reports [place]  Result Date: 08/27/2016 CLINICAL DATA:  History of cardiac catheterization. Follow-up right groin pseudoaneurysm. EXAM: UNILATERAL RIGHT LOWER EXTREMITY ARTERIAL DUPLEX SCAN TECHNIQUE: Gray-scale sonography as well as color Doppler and duplex ultrasound was performed to evaluate the arteries of the lower extremity. COMPARISON:  08/26/2016 FINDINGS: Images were obtained in the right groin. There continues to be evidence for a right groin pseudoaneurysm with a narrow neck. Neck measures roughly 0.3 cm in diameter. The pseudoaneurysm measures 2.5 x 1.6 x 2.4 cm and previously measured 2.4 x 1.6 x 1.4 cm. Right femoral arteries in the right groin remain patent. There continues to be a heterogeneous hematoma in the right groin. Hematoma appears larger than the recent comparison examination. The hematoma measures roughly 8.5 x 2.4 x 6.2 cm and previously measured 5.8 x 3.9 x 1.0 cm. IMPRESSION: Right groin pseudoaneurysm has slightly enlarged in size. There continues to be a neck between a right femoral artery and the pseudo aneurysm. Enlargement of the right groin hematoma. Electronically Signed   By: Markus Daft M.D.   On: 08/27/2016 13:10   [place] Lower [employer]  Result Date: 08/26/2016 CLINICAL DATA:  Acute onset of right groin bruising blood status post recent cardiac catheterization at the right groin. EXAM: UNILATERAL RIGHT LOWER EXTREMITY ARTERIAL DUPLEX SCAN TECHNIQUE: Gray-scale sonography as well as color Doppler and duplex ultrasound was performed to evaluate the arteries of the lower extremity. COMPARISON:  None. FINDINGS: There appears to be a focal 2.4 x 1.6 x 1.4 cm patent pseudoaneurysm at the right inguinal region, 2 cm below the skin surface, with an  associated patent neck to the common femoral artery, demonstrating  to and fro flow. There is also a nearby complex elongated fluid collection at the right inguinal region, measuring 5.8 x 3.9 x 1.0 cm, likely reflecting a soft tissue hematoma. IMPRESSION: 1. Focal 2.4 x 1.6 x 1.4 cm patent pseudoaneurysm noted at the right inguinal region, 2 cm below the skin surface, with an associated patent neck to the common femoral artery, demonstrating to and fro flow. 2. Complex elongated fluid collection at the right inguinal region measures 5.8 x 3.9 x 1.0 cm, likely reflecting a soft tissue hematoma. These results were called by telephone at the time of interpretation on 08/26/2016 at 3:52 am to Dr. Rudene Re, who verbally acknowledged these results. Electronically Signed   By: Garald Balding M.D.   On: 08/26/2016 03:53   Dg Chest Portable 1 View  Result Date: 08/19/2016 CLINICAL DATA:  Acute onset of midsternal chest pain. Initial encounter. EXAM: PORTABLE CHEST 1 VIEW COMPARISON:  None. FINDINGS: The lungs are well-aerated. Mild bibasilar opacities may reflect mild interstitial edema or atelectasis. There is no evidence of pleural effusion or pneumothorax. The cardiomediastinal silhouette is borderline enlarged. No acute osseous abnormalities are seen. A right-sided dual-lumen catheter is noted ending about the cavoatrial junction. IMPRESSION: Mild bibasilar airspace opacities may reflect mild interstitial edema or atelectasis. Borderline cardiomegaly. Electronically Signed   By: Garald Balding M.D.   On: 08/19/2016 22:50     CBC  Recent Labs Lab 08/26/16 0046 08/26/16 0733 08/27/16 0417  WBC 9.9 8.0 8.6  HGB 10.1* 9.6* 8.7*  HCT 29.7* 28.2* 25.0*  PLT 146* 121* 123*  MCV 88.6 90.0 88.6  MCH 30.2 30.7 30.8  MCHC 34.1 34.1 34.7  RDW 14.4 14.7* 14.4  LYMPHSABS 2.1  --   --   MONOABS 0.7  --   --   EOSABS 0.4  --   --   BASOSABS 0.1  --   --     Chemistries   Recent Labs Lab  08/21/16 1610 08/26/16 0046 08/26/16 0733 08/27/16 0417  NA 134* 136 138 138  K 4.0 3.9 4.0 3.9  CL 98* 96* 100* 99*  CO2 26 26 28 30   GLUCOSE 300* 254* 243* 161*  BUN 44* 53* 58* 37*  CREATININE 6.41* 6.72* 6.92* 5.23*  CALCIUM 8.5* 8.5* 8.4* 8.5*  AST  --  29  --   --   ALT  --  23  --   --   ALKPHOS  --  64  --   --   BILITOT  --  0.7  --   --    ------------------------------------------------------------------------------------------------------------------ estimated creatinine clearance is 14.1 mL/min (A) (by C-G formula based on SCr of 5.23 mg/dL (H)). ------------------------------------------------------------------------------------------------------------------ No results for input(s): HGBA1C in the last 72 hours. ------------------------------------------------------------------------------------------------------------------ No results for input(s): CHOL, HDL, LDLCALC, TRIG, CHOLHDL, LDLDIRECT in the last 72 hours. ------------------------------------------------------------------------------------------------------------------ No results for input(s): TSH, T4TOTAL, T3FREE, THYROIDAB in the last 72 hours.  Invalid input(s): FREET3 ------------------------------------------------------------------------------------------------------------------ No results for input(s): VITAMINB12, FOLATE, FERRITIN, TIBC, IRON, RETICCTPCT in the last 72 hours.  Coagulation profile  Recent Labs Lab 08/26/16 0046  INR 1.06    No results for input(s): DDIMER in the last 72 hours.  Cardiac Enzymes No results for input(s): CKMB, TROPONINI, MYOGLOBIN in the last 168 hours.  Invalid input(s): CK ------------------------------------------------------------------------------------------------------------------ Invalid input(s): [place]  Patient is a 76 year old with recent cardiac catheterization 1. Right inguinal hematoma and right inguinal pseuoaneursm  In  by vascular,  Status  post thrombin injection yesterday for pseudoaneurysm resolution. Repeat ultrasound ordered for right groin by vascular.. If there is no worsening of pseudoaneurysm and its shows resolution patient can be discharged home. Discharge instructions are already in the computer. And patient agreed for this. \  2. End-stage renal disease patient will have hemodialysis and a, Wednesday, Friday.  S/p hemodialysis today. 3. Type 2 diabetes meters continue sliding scale insulin and Levemir 4. Coronary artery disease continue Coreg and Imdur 5. Hyperlipidemia unspecified continue Lipitor 6. Essential hypertension continue therapy with amlodipine and Coreg and Cardura 7. Miscellaneous SCDs for DVT prophylaxis #8 shortness of breath on exertion: Hypoxia on exertion with sats less than 88%, patient qualified for oxygen 2 L by nasal cannula for CHF. D/w RN.    Code Status Orders        Start     Ordered   08/26/16 0645  Full code  Continuous     08/26/16 0644    Code Status History    Date Active Date Inactive Code Status Order ID Comments User Context   08/20/2016  3:05 AM 08/22/2016  1:01 PM Full Code [id number]  Lance Coon, MD ED    Advance Directive Documentation     Most Recent Value  Type of Advance Directive  Healthcare Power of Attorney  Pre-existing out of facility DNR order (yellow form or pink MOST form)  -  "MOST" Form in Place?  -           Consults  Cadriology, vascular, nephrology  DVT Prophylaxis  scd's  Lab Results  Component Value Date   PLT 123 (L) 08/27/2016     Time Spent in minutes  99min Greater than 50% of time spent in care coordination and counseling patient regarding the condition and plan of care.   Epifanio Lesches M.D on 08/28/2016 at 2:08 PM  Between 7am to 6pm - Pager - [phone]  After 6pm go to [email or web address] - password [email or web address] [facility]   Office  [phone]

## 2016-08-28 NOTE — Plan of Care (Signed)
Problem: Pain Managment: Goal: General experience of comfort will improve Outcome: Completed/Met Date Met: 08/28/16 No complaints of pain this shift, will continue to monitor.  Problem: Fluid Volume: Goal: Compliance with measures to maintain balanced fluid volume will improve Outcome: Progressing Pt will go for dialysis today   Problem: Cardiovascular: Goal: Vascular access site(s) Level 0-1 will be maintained Outcome: Not Progressing Right groin site still with bruising, which is what he came in with, but it is not painful will continue to monitor.

## 2016-08-29 ENCOUNTER — Ambulatory Visit (INDEPENDENT_AMBULATORY_CARE_PROVIDER_SITE_OTHER): Payer: Medicare Other | Admitting: Podiatry

## 2016-08-29 ENCOUNTER — Encounter: Payer: Self-pay | Admitting: Podiatry

## 2016-08-29 DIAGNOSIS — E1149 Type 2 diabetes mellitus with other diabetic neurological complication: Secondary | ICD-10-CM | POA: Diagnosis not present

## 2016-08-29 DIAGNOSIS — M2020 Hallux rigidus, unspecified foot: Secondary | ICD-10-CM

## 2016-08-29 DIAGNOSIS — B351 Tinea unguium: Secondary | ICD-10-CM

## 2016-08-29 DIAGNOSIS — M205X9 Other deformities of toe(s) (acquired), unspecified foot: Secondary | ICD-10-CM

## 2016-08-29 NOTE — Progress Notes (Signed)
Complaint:  Visit Type: Patient returns to my office for continued preventative foot care services. Complaint: Patient states" my nails have grown long and thick and become painful to walk and wear shoes" Patient has been diagnosed with DM with no foot complications. The patient presents for preventative foot care services. No changes to ROS  Podiatric Exam: Vascular: dorsalis pedis and posterior tibial pulses are palpable bilateral. Capillary return is immediate. Temperature gradient is WNL. Skin turgor WNL  Sensorium: Normal Semmes Weinstein monofilament test. Normal tactile sensation bilaterally. Nail Exam: Pt has thick disfigured discolored nails with subungual debris noted bilateral entire nail hallux through fifth toenails Ulcer Exam: There is no evidence of ulcer or pre-ulcerative changes or infection. Orthopedic Exam: Muscle tone and strength are WNL. No limitations in general ROM. No crepitus or effusions noted. Foot type and digits show no abnormalities. Bony prominences are unremarkable. Skin: No Porokeratosis. No infection or ulcers  Diagnosis:  Onychomycosis, , Pain in right toe, pain in left toes,  Diabetes with angiopathy and neuropathy.  Treatment & Plan Procedures and Treatment: Consent by patient was obtained for treatment procedures. The patient understood the discussion of treatment and procedures well. All questions were answered thoroughly reviewed. Debridement of mycotic and hypertrophic toenails, 1 through 5 bilateral and clearing of subungual debris. No ulceration, no infection noted. 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 Return Visit-Office Procedure: Patient instructed to return to the office for a follow up visit 3 months for continued evaluation and treatment.    Gardiner Barefoot DPM

## 2016-09-03 DIAGNOSIS — Z720 Tobacco use: Secondary | ICD-10-CM | POA: Insufficient documentation

## 2016-09-03 NOTE — Discharge Summary (Signed)
Calvin Good, is a 76 y.o. male  DOB 30-Jul-1940  MRN [id number].  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 Cancer [facility])    Prostate  . CHF (congestive heart failure) ([place])   . Chronic kidney disease   . Complication of anesthesia    hard to wake up  . COPD (chronic obstructive pulmonary disease) ([place])   . Diabetes mellitus without complication ([place])   . Difficult intubation   . Dyspnea   . GERD (gastroesophageal reflux disease)   . Hyperlipidemia   . Hypertension     Past Surgical History:  Procedure Laterality Date  . APPENDECTOMY    . AV FISTULA PLACEMENT Left 05/30/2016   Procedure: ARTERIOVENOUS (AV) FISTULA CREATION ( BRACHIOCEPHALIC );  Surgeon: [name], MD;  Location: [facility] ORS;  Service: Vascular;  Laterality: Left;  . CAPD INSERTION N/A 05/30/2016   Procedure: LAPAROSCOPIC INSERTION CONTINUOUS AMBULATORY PERITONEAL DIALYSIS  (CAPD) CATHETER;  Surgeon: [name], MD;  Location: [facility] ORS;  Service: Vascular;  Laterality: N/A;  . DIALYSIS/PERMA CATHETER INSERTION    . DIALYSIS/PERMA CATHETER REMOVAL N/A 08/21/2016   Procedure: Dialysis/Perma Catheter Removal;  Surgeon: [name], MD;   Location: [place] CV LAB;  Service: Cardiovascular;  Laterality: N/A;  . JOINT REPLACEMENT     right knee   . LEFT HEART CATH AND CORONARY ANGIOGRAPHY N/A 08/21/2016   Procedure: Left Heart Cath and Coronary Angiography possible PCI;  Surgeon: [name], MD;  Location: [place] CV LAB;  Service: Cardiovascular;  Laterality: N/A;  . postate removal     . PROSTATE SURGERY    . PSEUDOANERYSM COMPRESSION Right 08/27/2016   Procedure: Pseudoanerysm Compression;  Surgeon: Katha Cabal, MD;  Location: [place] CV LAB;  Service: Cardiovascular;  Laterality: Right;  . REMOVAL OF A DIALYSIS CATHETER N/A 08/08/2016   Procedure: REMOVAL OF A DIALYSIS CATHETER;  Surgeon: [name], MD;  Location: [facility] ORS;  Service: Vascular;  Laterality: N/A;       History of present illness and  Hospital Course:     Kindly see H&P for history of present illness and admission details, please review complete Labs, Consult reports and Test reports for all details in brief  HPI  from the history and physical done on the day of admission    Hospital Course  1.Right inguinal hematoma and right inguinal pseuoaneursm Seen by vascular, ,Status post thrombin injection  for pseudoaneurysm resolution. Repeat ultrasound  Of  right groin not show worsening of aneurysm and decrease in the size of aneurysm after thrombin injection so we discharged the patient home.\  2. End-stage renal disease patient will have hemodialysis Monday, Wednesday, Friday.  S/p hemodialysis on discharge day. Continue with nephro for routine dialysis needs. 3.Type 2 diabetes meters ; Lisipro, NPH insulin. 4. Coronary artery disease ;continue Coreg and Imdur 5. Hyperlipidemia  unspecified continue Lipitor 6. Essential hypertension; continue therapy with amlodipine and Coreg and Cardura 7. Miscellaneous SCDs for DVT prophylaxis #8 shortness of breath on exertion: Hypoxia on exertion with sats less than 88%, patient  qualified for oxygen 2 L by nasal cannula for CHF.     Discharge Condition: stable   Follow UP  Follow-up Information    [place] [place] Follow up.   Specialty:  Home Health Services Why:  Home health nurse Contact information: [place] [address] [place] Follow up.   Why:  oxygen Contact information: [place] [address] [phone]        [name], MD Follow up in 1 week(s).   Specialty:  Internal Medicine Contact information: [address] [facility] [place] [address] [phone]             Discharge Instructions  and  Discharge Medications     Discharge Instructions    Face-to-face encounter (required for Medicare/Medicaid patients)    Complete by:  As directed    I [facility] certify that this patient is under my care and that I, or a nurse practitioner or physician's assistant working with me, had a face-to-face encounter that meets the physician face-to-face encounter requirements with this patient on 08/28/2016. The encounter with the patient was in whole, or in part for the following medical condition(s) which is the primary reason for home health care  Sob ESRD DMII   The encounter with the patient was in whole, or in part, for the following medical condition, which is the primary reason for home health care:  whole   I certify that, based on my findings, the following services are medically necessary home health services:  Nursing   Reason for Medically Necessary Home Health Services:  Therapy- Therapeutic Exercises to Increase Strength and Endurance   My clinical findings support the need for the above services:  Shortness of breath with activity   Further, I certify that my clinical findings support that this patient is homebound due to:  Shortness of Breath with activity   Home Health     Complete by:  As directed    To provide the following care/treatments:  RN     Allergies as of 08/28/2016      Reactions   Tetracyclines & Related Other (See Comments)   Reaction: unknown   Morphine Other (See Comments)   Reaction: confusion      Medication List    STOP taking these medications   indomethacin 50 MG capsule Commonly known as:  INDOCIN     TAKE these medications   amLODipine 10 MG tablet Commonly known as:  NORVASC Take 10 mg by mouth every evening.   aspirin 81 MG EC tablet Chew 81 mg by mouth daily.   atorvastatin 20 MG tablet Commonly known as:  LIPITOR Take 2 tablets (40 mg total) by mouth daily.   calcium acetate 667 MG capsule Commonly known as:  PHOSLO Take 1,334 mg by mouth 3 (three) times daily with meals.   carvedilol 25 MG tablet Commonly known as:  COREG Take 12.5 mg by mouth 2 (two) times daily with a meal.   doxazosin 8 MG tablet Commonly known as:  CARDURA Take 8 mg by mouth daily.   furosemide 40 MG tablet Commonly known as:  LASIX Take 40 mg  by mouth 2 (two) times daily.   insulin lispro 100 UNIT/ML injection Commonly known as:  HUMALOG Inject 10 Units into the skin 3 (three) times daily before meals.   insulin NPH Human 100 UNIT/ML injection Commonly known as:  HUMULIN N,NOVOLIN N Inject 60 Units into the skin at bedtime.   isosorbide mononitrate 30 MG 24 hr tablet Commonly known as:  IMDUR Take 1 tablet (30 mg total) by mouth daily.   lisinopril 40 MG tablet Commonly known as:  PRINIVIL,ZESTRIL Take 40 mg by mouth every evening.   omeprazole 20 MG capsule Commonly known as:  PRILOSEC Take 20 mg by mouth 2 (two) times daily before a meal.         Diet and Activity recommendation: See Discharge Instructions above   Consults obtained - vascular,nephrology,   Major procedures and Radiology Reports - PLEASE review detailed and final reports for all details, in brief -      [place]  Result Date: 08/28/2016 CLINICAL DATA:  History of pseudo aneurysm, post thrombin injection. EXAM: UNILATERAL RIGHT LOWER EXTREMITY ARTERIAL DUPLEX SCAN TECHNIQUE: Gray-scale sonography as well as color Doppler and duplex ultrasound was performed to evaluate the arteries of the lower extremity. COMPARISON:  Unilateral right lower extremity arterial duplex ultrasound - 08/27/2016; 08/26/2016 FINDINGS: No flow is demonstrated within previously identified right groin pseudoaneurysm. The aneurysm appears to have decreased in size the interval, currently measuring 1.7 x 1.8 x 1.7 cm, previously, 2.5 x 1.6 x 2.4 cm. Previous identified pseudoaneurysm neck is no longer visualized. The adjacent right femoral vein and artery appear widely patent. Adjacent ill-defined hematoma is also likely minimally decreased in size the interval, currently measuring 7.4 x 3.3 x 2.5 cm, previously, 8.5 x 2.4 x 6.2 cm IMPRESSION: 1. 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 The now thrombosed pseudoaneurysm has also decreased in size, currently measuring 1.8 cm, previously, 2.5 cm. 2. Slight decrease in size of ill-defined right groin hematoma currently measuring 7.4 cm, previously, 8.5 cm. Electronically Signed   By: Sandi Mariscal M.D.   On: 08/28/2016 15:10   [place] Lower Ext [employer]  Result Date: 08/27/2016 CLINICAL DATA:  History of cardiac catheterization. Follow-up right groin pseudoaneurysm. EXAM: UNILATERAL RIGHT LOWER EXTREMITY ARTERIAL DUPLEX SCAN TECHNIQUE: Gray-scale sonography as well as color Doppler and duplex ultrasound was performed to evaluate the arteries of the lower extremity. COMPARISON:  08/26/2016 FINDINGS: Images were obtained in the right groin. There continues to be evidence for a right groin pseudoaneurysm with a narrow neck. Neck measures roughly 0.3 cm in diameter. The pseudoaneurysm measures 2.5 x 1.6 x 2.4 cm and  previously measured 2.4 x 1.6 x 1.4 cm. Right femoral arteries in the right groin remain patent. There continues to be a heterogeneous hematoma in the right groin. Hematoma appears larger than the recent comparison examination. The hematoma measures roughly 8.5 x 2.4 x 6.2 cm and previously measured 5.8 x 3.9 x 1.0 cm. IMPRESSION: Right groin pseudoaneurysm has slightly enlarged in size. There continues to be a neck between a right femoral artery and the pseudo aneurysm. Enlargement of the right groin hematoma. Electronically Signed   By: Markus Daft M.D.   On: 08/27/2016 13:10   [place] Lower Ext Art Right [employer]  Result Date: 08/26/2016 CLINICAL DATA:  Acute onset of right groin bruising blood status post recent cardiac catheterization at the right groin. EXAM: UNILATERAL RIGHT LOWER  EXTREMITY ARTERIAL DUPLEX SCAN TECHNIQUE: Gray-scale sonography as well as color Doppler and duplex ultrasound was performed to evaluate the arteries of the lower extremity. COMPARISON:  None. FINDINGS: There appears to be a focal 2.4 x 1.6 x 1.4 cm patent pseudoaneurysm at the right inguinal region, 2 cm below the skin surface, with an associated patent neck to the common femoral artery, demonstrating to and fro flow. There is also a nearby complex elongated fluid collection at the right inguinal region, measuring 5.8 x 3.9 x 1.0 cm, likely reflecting a soft tissue hematoma. IMPRESSION: 1. Focal 2.4 x 1.6 x 1.4 cm patent pseudoaneurysm noted at the right inguinal region, 2 cm below the skin surface, with an associated patent neck to the common femoral artery, demonstrating to and fro flow. 2. Complex elongated fluid collection at the right inguinal region measures 5.8 x 3.9 x 1.0 cm, likely reflecting a soft tissue hematoma. These results were called by telephone at the time of interpretation on 08/26/2016 at 3:52 am to Dr. Rudene Re, who verbally acknowledged these results. Electronically Signed   By: Garald Balding M.D.   On:  08/26/2016 03:53   Dg Chest Portable 1 View  Result Date: 08/19/2016 CLINICAL DATA:  Acute onset of midsternal chest pain. Initial encounter. EXAM: PORTABLE CHEST 1 VIEW COMPARISON:  None. FINDINGS: The lungs are well-aerated. Mild bibasilar opacities may reflect mild interstitial edema or atelectasis. There is no evidence of pleural effusion or pneumothorax. The cardiomediastinal silhouette is borderline enlarged. No acute osseous abnormalities are seen. A right-sided dual-lumen catheter is noted ending about the cavoatrial junction. IMPRESSION: Mild bibasilar airspace opacities may reflect mild interstitial edema or atelectasis. Borderline cardiomegaly. Electronically Signed   By: Garald Balding M.D.   On: 08/19/2016 22:50    Micro Results    No results found for this or any previous visit (from the past 240 hour(s)).     Today   Subjective:   Calvin Good today has no headache,no chest abdominal pain,no new weakness tingling or numbness, feels much better wants to go home today.   Objective:   Blood pressure (!) 150/137, pulse 83, temperature 98.1 F (36.7 C), temperature source Oral, resp. rate 18, height 5\' 9"  (1.753 m), weight 108 kg (238 lb 1.6 oz), SpO2 97 %.  No intake or output data in the 24 hours ending 09/03/16 1207  Exam Awake Alert, Oriented x 3, No new F.N deficits, Normal affect [place].AT,PERRAL Supple Neck,No JVD, No cervical lymphadenopathy appriciated.  Symmetrical Chest wall movement, Good air movement bilaterally, CTAB RRR,No Gallops,Rubs or new Murmurs, No Parasternal Heave +ve B.Sounds, Abd Soft, Non tender, No organomegaly appriciated, No rebound -guarding or rigidity. No Cyanosis, Clubbing or edema, No new Rash or bruise  Data Review   CBC w Diff:  Lab Results  Component Value Date   WBC 8.6 08/27/2016   HGB 8.7 (L) 08/27/2016   HCT 25.0 (L) 08/27/2016   PLT 123 (L) 08/27/2016   LYMPHOPCT 21 08/26/2016   MONOPCT 7 08/26/2016   EOSPCT 4 08/26/2016    BASOPCT 1 08/26/2016    CMP:  Lab Results  Component Value Date   NA 138 08/27/2016   K 3.9 08/27/2016   CL 99 (L) 08/27/2016   CO2 30 08/27/2016   BUN 37 (H) 08/27/2016   CREATININE 5.23 (H) 08/27/2016   PROT 7.0 08/26/2016   ALBUMIN 3.2 (L) 08/27/2016   BILITOT 0.7 08/26/2016   ALKPHOS 64 08/26/2016   AST 29 08/26/2016  ALT 23 08/26/2016  .   Total Time in preparing paper work, data evaluation and todays exam - 69 minutes  Sophina Mitten M.D on 09/03/2016 at 12:07 PM    Note: This dictation was prepared with Dragon dictation along with smaller phrase technology. Any transcriptional errors that result from this process are unintentional.

## 2016-09-05 ENCOUNTER — Encounter (INDEPENDENT_AMBULATORY_CARE_PROVIDER_SITE_OTHER): Payer: Self-pay | Admitting: Vascular Surgery

## 2016-09-05 ENCOUNTER — Ambulatory Visit (INDEPENDENT_AMBULATORY_CARE_PROVIDER_SITE_OTHER): Payer: Medicare Other | Admitting: Vascular Surgery

## 2016-09-05 VITALS — BP 125/55 | HR 70 | Resp 16 | Wt 231.0 lb

## 2016-09-05 DIAGNOSIS — N186 End stage renal disease: Secondary | ICD-10-CM | POA: Diagnosis not present

## 2016-09-05 DIAGNOSIS — I729 Aneurysm of unspecified site: Secondary | ICD-10-CM | POA: Diagnosis not present

## 2016-09-05 DIAGNOSIS — I9789 Other postprocedural complications and disorders of the circulatory system, not elsewhere classified: Secondary | ICD-10-CM | POA: Diagnosis not present

## 2016-09-05 DIAGNOSIS — Z992 Dependence on renal dialysis: Secondary | ICD-10-CM | POA: Diagnosis not present

## 2016-09-05 DIAGNOSIS — T81718A Complication of other artery following a procedure, not elsewhere classified, initial encounter: Secondary | ICD-10-CM

## 2016-09-05 NOTE — Progress Notes (Signed)
Subjective:    Patient ID: Calvin Good, male    DOB: [birth year], 76 y.o.   MRN: [id number] Chief Complaint  Patient presents with  . Follow-up   The patient presents status post a repair of a right femoral pseudoaneurysm on Aug 27, 2016. His postprocedure course has been uneventful. He presents today without complaint. He has not removed his procedure dressing to the right groin. He denies any drainage to the dressing. He denies any lower extremity pain. He is experiencing some shortness of breath with activity. He is going to follow up with his cardiologist next. Denies any fever nausea vomiting.   Review of Systems  Constitutional: Negative.   HENT: Negative.   Eyes: Negative.   Respiratory: Negative.   Cardiovascular: Negative.   Gastrointestinal: Negative.   Endocrine: Negative.   Genitourinary: Negative.   Musculoskeletal: Negative.   Skin: Negative.   Allergic/Immunologic: Negative.   Neurological: Negative.   Hematological: Negative.   Psychiatric/Behavioral: Negative.        Objective:   Physical Exam  Constitutional: He is oriented to person, place, and time. He appears well-developed and well-nourished.  On oxygen  HENT:  Head: Normocephalic and atraumatic.  Eyes: Conjunctivae are normal. Pupils are equal, round, and reactive to light.  Neck: Normal range of motion.  Cardiovascular: Normal rate, regular rhythm, normal heart sounds and intact distal pulses.   Pulses:      Carotid pulses are 2+ on the right side, and 2+ on the left side.      Dorsalis pedis pulses are 1+ on the right side, and 1+ on the left side.       Posterior tibial pulses are 1+ on the right side, and 1+ on the left side.  Left upper extremity access: Good. And thrill. Skin intact. There is some ecchymosis around the site.  Right groin: Removed procedure dressing. Incision is healed. No swelling. No drainage. No infection noted.  Pulmonary/Chest: Effort normal.  Musculoskeletal: Normal  range of motion.  Neurological: He is alert and oriented to person, place, and time.  Skin: Skin is warm and dry.  Psychiatric: He has a normal mood and affect. His behavior is normal. Judgment and thought content normal.    BP (!) 125/55   Pulse 70   Resp 16   Wt 231 lb (104.8 kg)   BMI 34.11 kg/m   Past Medical History:  Diagnosis Date  . Cancer [facility])    Prostate  . CHF (congestive heart failure) ([place])   . Chronic kidney disease   . Complication of anesthesia    hard to wake up  . COPD (chronic obstructive pulmonary disease) ([place])   . Diabetes mellitus without complication ([place])   . Difficult intubation   . Dyspnea   . GERD (gastroesophageal reflux disease)   . Hyperlipidemia   . Hypertension     Social History   Social History  . Marital status: Widowed    Spouse name: N/A  . Number of children: N/A  . Years of education: N/A   Occupational History  . retired    Social History Main Topics  . Smoking status: Former Smoker    Quit date: 05/21/1982  . Smokeless tobacco: Never Used  . Alcohol use 1.8 oz/week    3 Glasses of wine per week  . Drug use: No  . Sexual activity: No   Other Topics Concern  . Not on file   Social History Narrative  . No narrative on file  Past Surgical History:  Procedure Laterality Date  . APPENDECTOMY    . AV FISTULA PLACEMENT Left 05/30/2016   Procedure: ARTERIOVENOUS (AV) FISTULA CREATION ( BRACHIOCEPHALIC );  Surgeon: [name], MD;  Location: [facility] ORS;  Service: Vascular;  Laterality: Left;  . CAPD INSERTION N/A 05/30/2016   Procedure: LAPAROSCOPIC INSERTION CONTINUOUS AMBULATORY PERITONEAL DIALYSIS  (CAPD) CATHETER;  Surgeon: [name], MD;  Location: [facility] ORS;  Service: Vascular;  Laterality: N/A;  . DIALYSIS/PERMA CATHETER INSERTION    . DIALYSIS/PERMA CATHETER REMOVAL N/A 08/21/2016   Procedure: Dialysis/Perma Catheter Removal;  Surgeon: [name], MD;  Location: [place] CV LAB;  Service: Cardiovascular;   Laterality: N/A;  . JOINT REPLACEMENT     right knee   . LEFT HEART CATH AND CORONARY ANGIOGRAPHY N/A 08/21/2016   Procedure: Left Heart Cath and Coronary Angiography possible PCI;  Surgeon: [name], MD;  Location: [place] CV LAB;  Service: Cardiovascular;  Laterality: N/A;  . postate removal     . PROSTATE SURGERY    . PSEUDOANERYSM COMPRESSION Right 08/27/2016   Procedure: Pseudoanerysm Compression;  Surgeon: Katha Cabal, MD;  Location: [place] CV LAB;  Service: Cardiovascular;  Laterality: Right;  . REMOVAL OF A DIALYSIS CATHETER N/A 08/08/2016   Procedure: REMOVAL OF A DIALYSIS CATHETER;  Surgeon: [name], MD;  Location: [facility] ORS;  Service: Vascular;  Laterality: N/A;    Family History  Problem Relation Age of Onset  . Cancer Father   . CAD Neg Hx   . Diabetes Neg Hx     Allergies  Allergen Reactions  . Tetracyclines & Related Other (See Comments)    Reaction: unknown  . Morphine Other (See Comments)    Reaction: confusion       Assessment & Plan:  The patient presents status post a repair of a right femoral pseudoaneurysm on Aug 27, 2016. His postprocedure course has been uneventful. He presents today without complaint. He has not removed his procedure dressing to the right groin. He denies any drainage to the dressing. He denies any lower extremity pain. He is experiencing some shortness of breath with activity. He is going to follow up with his cardiologist next. Denies any fever nausea vomiting.  1. ESRD on dialysis ([place]) - Stable Followed regularly with surveillance duplex. Patient states access is functioning well. No intervention at this time.  2. Pseudoaneurysm following procedure ([place]) - new Right groin access point healing well. Patient is without complaint. Denies any claudication or rest pain or ulcer development is lower extremity. No intervention at this time indicated. Patient to follow up in 1 month with duplex to assess  pseudoaneurysm.  - VAS [place] ABI WITH/WO TBI; Future - VAS US AORTA/IVC/ILIACS; Future - VAS [place] LOWER EXTREMITY ARTERIAL DUPLEX; Future  Current Outpatient Prescriptions on File Prior to Visit  Medication Sig Dispense Refill  . amLODipine (NORVASC) 10 MG tablet Take 10 mg by mouth every evening.     Marland Kitchen aspirin 81 MG EC tablet Chew 81 mg by mouth daily.     Marland Kitchen atorvastatin (LIPITOR) 20 MG tablet Take 2 tablets (40 mg total) by mouth daily. 30 tablet 0  . calcium acetate (PHOSLO) 667 MG capsule Take 1,334 mg by mouth 3 (three) times daily with meals.     . carvedilol (COREG) 25 MG tablet Take 12.5 mg by mouth 2 (two) times daily with a meal.     . doxazosin (CARDURA) 8 MG tablet Take 8  mg by mouth daily.    . furosemide (LASIX) 40 MG tablet Take 40 mg by mouth 2 (two) times daily.    . insulin lispro (HUMALOG) 100 UNIT/ML injection Inject 10 Units into the skin 3 (three) times daily before meals.     . insulin NPH Human (HUMULIN N,NOVOLIN N) 100 UNIT/ML injection Inject 60 Units into the skin at bedtime.     . isosorbide mononitrate (IMDUR) 30 MG 24 hr tablet Take 1 tablet (30 mg total) by mouth daily. 60 tablet 0  . lisinopril (PRINIVIL,ZESTRIL) 40 MG tablet Take 40 mg by mouth every evening.     Marland Kitchen omeprazole (PRILOSEC) 20 MG capsule Take 20 mg by mouth 2 (two) times daily before a meal.     No current facility-administered medications on file prior to visit.     There are no Patient Instructions on file for this visit. No Follow-up on file.   Salwa Bai A Effrey Davidow, PA-C

## 2016-09-14 ENCOUNTER — Inpatient Hospital Stay: Payer: Medicare Other

## 2016-09-14 ENCOUNTER — Emergency Department: Payer: Medicare Other

## 2016-09-14 ENCOUNTER — Encounter: Payer: Self-pay | Admitting: *Deleted

## 2016-09-14 ENCOUNTER — Inpatient Hospital Stay
Admission: EM | Admit: 2016-09-14 | Discharge: 2016-09-23 | DRG: 193 | Disposition: A | Discharge status: Skilled Nursing Facility | Payer: Medicare Other | Attending: Internal Medicine | Admitting: Internal Medicine

## 2016-09-14 DIAGNOSIS — Z8546 Personal history of malignant neoplasm of prostate: Secondary | ICD-10-CM | POA: Diagnosis not present

## 2016-09-14 DIAGNOSIS — I5032 Chronic diastolic (congestive) heart failure: Secondary | ICD-10-CM | POA: Diagnosis present

## 2016-09-14 DIAGNOSIS — Z79899 Other long term (current) drug therapy: Secondary | ICD-10-CM

## 2016-09-14 DIAGNOSIS — J90 Pleural effusion, not elsewhere classified: Secondary | ICD-10-CM | POA: Diagnosis present

## 2016-09-14 DIAGNOSIS — R0602 Shortness of breath: Secondary | ICD-10-CM

## 2016-09-14 DIAGNOSIS — J9622 Acute and chronic respiratory failure with hypercapnia: Secondary | ICD-10-CM | POA: Diagnosis present

## 2016-09-14 DIAGNOSIS — N186 End stage renal disease: Secondary | ICD-10-CM | POA: Diagnosis present

## 2016-09-14 DIAGNOSIS — J9612 Chronic respiratory failure with hypercapnia: Secondary | ICD-10-CM | POA: Diagnosis not present

## 2016-09-14 DIAGNOSIS — G9341 Metabolic encephalopathy: Secondary | ICD-10-CM | POA: Diagnosis present

## 2016-09-14 DIAGNOSIS — E1122 Type 2 diabetes mellitus with diabetic chronic kidney disease: Secondary | ICD-10-CM | POA: Diagnosis present

## 2016-09-14 DIAGNOSIS — Z87891 Personal history of nicotine dependence: Secondary | ICD-10-CM | POA: Diagnosis not present

## 2016-09-14 DIAGNOSIS — E1165 Type 2 diabetes mellitus with hyperglycemia: Secondary | ICD-10-CM | POA: Diagnosis present

## 2016-09-14 DIAGNOSIS — I132 Hypertensive heart and chronic kidney disease with heart failure and with stage 5 chronic kidney disease, or end stage renal disease: Secondary | ICD-10-CM | POA: Diagnosis present

## 2016-09-14 DIAGNOSIS — Z992 Dependence on renal dialysis: Secondary | ICD-10-CM | POA: Diagnosis not present

## 2016-09-14 DIAGNOSIS — J9621 Acute and chronic respiratory failure with hypoxia: Secondary | ICD-10-CM | POA: Diagnosis present

## 2016-09-14 DIAGNOSIS — R7881 Bacteremia: Secondary | ICD-10-CM | POA: Diagnosis present

## 2016-09-14 DIAGNOSIS — E785 Hyperlipidemia, unspecified: Secondary | ICD-10-CM | POA: Diagnosis present

## 2016-09-14 DIAGNOSIS — G253 Myoclonus: Secondary | ICD-10-CM | POA: Diagnosis present

## 2016-09-14 DIAGNOSIS — K219 Gastro-esophageal reflux disease without esophagitis: Secondary | ICD-10-CM | POA: Diagnosis present

## 2016-09-14 DIAGNOSIS — Z794 Long term (current) use of insulin: Secondary | ICD-10-CM | POA: Diagnosis not present

## 2016-09-14 DIAGNOSIS — I2510 Atherosclerotic heart disease of native coronary artery without angina pectoris: Secondary | ICD-10-CM | POA: Diagnosis present

## 2016-09-14 DIAGNOSIS — Y95 Nosocomial condition: Secondary | ICD-10-CM | POA: Diagnosis present

## 2016-09-14 DIAGNOSIS — G9340 Encephalopathy, unspecified: Secondary | ICD-10-CM | POA: Diagnosis not present

## 2016-09-14 DIAGNOSIS — Z96651 Presence of right artificial knee joint: Secondary | ICD-10-CM

## 2016-09-14 DIAGNOSIS — R4701 Aphasia: Secondary | ICD-10-CM | POA: Diagnosis not present

## 2016-09-14 DIAGNOSIS — J189 Pneumonia, unspecified organism: Secondary | ICD-10-CM | POA: Diagnosis present

## 2016-09-14 DIAGNOSIS — Z888 Allergy status to other drugs, medicaments and biological substances status: Secondary | ICD-10-CM

## 2016-09-14 DIAGNOSIS — B9689 Other specified bacterial agents as the cause of diseases classified elsewhere: Secondary | ICD-10-CM | POA: Diagnosis present

## 2016-09-14 DIAGNOSIS — Z9981 Dependence on supplemental oxygen: Secondary | ICD-10-CM

## 2016-09-14 DIAGNOSIS — R0902 Hypoxemia: Secondary | ICD-10-CM

## 2016-09-14 DIAGNOSIS — J440 Chronic obstructive pulmonary disease with acute lower respiratory infection: Secondary | ICD-10-CM | POA: Diagnosis present

## 2016-09-14 DIAGNOSIS — N2581 Secondary hyperparathyroidism of renal origin: Secondary | ICD-10-CM | POA: Diagnosis present

## 2016-09-14 DIAGNOSIS — D631 Anemia in chronic kidney disease: Secondary | ICD-10-CM | POA: Diagnosis present

## 2016-09-14 DIAGNOSIS — R278 Other lack of coordination: Secondary | ICD-10-CM | POA: Diagnosis not present

## 2016-09-14 DIAGNOSIS — Z885 Allergy status to narcotic agent status: Secondary | ICD-10-CM

## 2016-09-14 LAB — CBC WITH DIFFERENTIAL/PLATELET
BASOS ABS: 0.1 10*3/uL (ref 0–0.1)
Basophils Relative: 1 %
EOS PCT: 3 %
Eosinophils Absolute: 0.2 10*3/uL (ref 0–0.7)
HCT: 29.4 % — ABNORMAL LOW (ref 40.0–52.0)
Hemoglobin: 10.3 g/dL — ABNORMAL LOW (ref 13.0–18.0)
LYMPHS PCT: 20 %
Lymphs Abs: 1.6 10*3/uL (ref 1.0–3.6)
MCH: 30.3 pg (ref 26.0–34.0)
MCHC: 34.9 g/dL (ref 32.0–36.0)
MCV: 86.9 fL (ref 80.0–100.0)
Monocytes Absolute: 0.5 10*3/uL (ref 0.2–1.0)
Monocytes Relative: 7 %
NEUTROS ABS: 5.8 10*3/uL (ref 1.4–6.5)
Neutrophils Relative %: 71 %
PLATELETS: 175 10*3/uL (ref 150–440)
RBC: 3.39 MIL/uL — AB (ref 4.40–5.90)
RDW: 14.2 % (ref 11.5–14.5)
WBC: 8.2 10*3/uL (ref 3.8–10.6)

## 2016-09-14 LAB — BASIC METABOLIC PANEL
Anion gap: 10 (ref 5–15)
BUN: 27 mg/dL — ABNORMAL HIGH (ref 6–20)
CO2: 28 mmol/L (ref 22–32)
Calcium: 8.9 mg/dL (ref 8.9–10.3)
Chloride: 96 mmol/L — ABNORMAL LOW (ref 101–111)
Creatinine, Ser: 3.98 mg/dL — ABNORMAL HIGH (ref 0.61–1.24)
GFR calc Af Amer: 15 mL/min — ABNORMAL LOW (ref >60)
GFR, EST NON AFRICAN AMERICAN: 13 mL/min — AB (ref >60)
GLUCOSE: 280 mg/dL — AB (ref 65–99)
Potassium: 3.9 mmol/L (ref 3.5–5.1)
Sodium: 134 mmol/L — ABNORMAL LOW (ref 135–145)

## 2016-09-14 LAB — BODY FLUID CELL COUNT WITH DIFFERENTIAL
EOS FL: 0 %
Lymphs, Fluid: 76 %
Monocyte-Macrophage-Serous Fluid: 23 %
Neutrophil Count, Fluid: 1 %
Other Cells, Fluid: 0 %
WBC FLUID: 292 uL

## 2016-09-14 LAB — EXPECTORATED SPUTUM ASSESSMENT W REFEX TO RESP CULTURE

## 2016-09-14 LAB — PROTEIN, PLEURAL OR PERITONEAL FLUID

## 2016-09-14 LAB — GLUCOSE, CAPILLARY
GLUCOSE-CAPILLARY: 388 mg/dL — AB (ref 65–99)
Glucose-Capillary: 238 mg/dL — ABNORMAL HIGH (ref 65–99)

## 2016-09-14 LAB — TROPONIN I

## 2016-09-14 LAB — LACTATE DEHYDROGENASE, PLEURAL OR PERITONEAL FLUID: LD FL: 68 U/L — AB (ref 3–23)

## 2016-09-14 LAB — GLUCOSE, PLEURAL OR PERITONEAL FLUID: Glucose, Fluid: 269 mg/dL

## 2016-09-14 LAB — BRAIN NATRIURETIC PEPTIDE: B NATRIURETIC PEPTIDE 5: 639 pg/mL — AB (ref 0.0–100.0)

## 2016-09-14 MED ORDER — ENOXAPARIN SODIUM 30 MG/0.3ML ~~LOC~~ SOLN: 30.0000 mg | SUBCUTANEOUS | Status: DC

## 2016-09-14 MED ORDER — VANCOMYCIN HCL IN DEXTROSE 1-5 GM/200ML-% IV SOLN
1000.0000 mg | Freq: Once | INTRAVENOUS | Status: AC
  Administered 2016-09-14: 1000 mg via INTRAVENOUS
  Filled 2016-09-14: qty 200

## 2016-09-14 MED ORDER — CALCIUM ACETATE (PHOS BINDER) 667 MG PO CAPS
1334.0000 mg | ORAL_CAPSULE | Freq: Three times a day (TID) | ORAL | Status: DC
  Administered 2016-09-15 – 2016-09-23 (×23): 1334 mg via ORAL
  Filled 2016-09-14 (×26): qty 2

## 2016-09-14 MED ORDER — DEXTROSE 5 % IV SOLN
2.0000 g | Freq: Once | INTRAVENOUS | Status: AC
  Administered 2016-09-14: 2 g via INTRAVENOUS
  Filled 2016-09-14: qty 2

## 2016-09-14 MED ORDER — INSULIN DETEMIR 100 UNIT/ML ~~LOC~~ SOLN
40.0000 [IU] | Freq: Two times a day (BID) | SUBCUTANEOUS | Status: DC
  Administered 2016-09-15 (×2): 40 [IU] via SUBCUTANEOUS
  Filled 2016-09-14 (×4): qty 0.4

## 2016-09-14 MED ORDER — IPRATROPIUM-ALBUTEROL 0.5-2.5 (3) MG/3ML IN SOLN
3.0000 mL | Freq: Once | RESPIRATORY_TRACT | Status: AC
  Administered 2016-09-14: 3 mL via RESPIRATORY_TRACT
  Filled 2016-09-14: qty 3

## 2016-09-14 MED ORDER — LISINOPRIL 20 MG PO TABS
20.0000 mg | ORAL_TABLET | Freq: Every day | ORAL | Status: DC
Start: 2016-09-15 — End: 2016-09-24
  Administered 2016-09-15 – 2016-09-23 (×8): 20 mg via ORAL
  Filled 2016-09-14 (×10): qty 1

## 2016-09-14 MED ORDER — ONDANSETRON HCL 4 MG PO TABS: 4.0000 mg | ORAL_TABLET | Freq: Four times a day (QID) | ORAL | Status: DC | PRN

## 2016-09-14 MED ORDER — ACETAMINOPHEN 650 MG RE SUPP: 650.0000 mg | Freq: Four times a day (QID) | RECTAL | Status: DC | PRN

## 2016-09-14 MED ORDER — ORAL CARE MOUTH RINSE
15.0000 mL | Freq: Two times a day (BID) | OROMUCOSAL | Status: DC
  Administered 2016-09-14 – 2016-09-23 (×13): 15 mL via OROMUCOSAL

## 2016-09-14 MED ORDER — FUROSEMIDE 10 MG/ML IJ SOLN
20.0000 mg | Freq: Two times a day (BID) | INTRAMUSCULAR | Status: DC
  Administered 2016-09-14 – 2016-09-18 (×8): 20 mg via INTRAVENOUS
  Filled 2016-09-14 (×8): qty 2

## 2016-09-14 MED ORDER — PANTOPRAZOLE SODIUM 40 MG PO TBEC
40.0000 mg | DELAYED_RELEASE_TABLET | Freq: Every day | ORAL | Status: DC
  Administered 2016-09-15 – 2016-09-23 (×8): 40 mg via ORAL
  Filled 2016-09-14 (×8): qty 1

## 2016-09-14 MED ORDER — ATORVASTATIN CALCIUM 20 MG PO TABS
40.0000 mg | ORAL_TABLET | Freq: Every day | ORAL | Status: DC
  Administered 2016-09-15 – 2016-09-22 (×7): 40 mg via ORAL
  Filled 2016-09-14 (×7): qty 2

## 2016-09-14 MED ORDER — VANCOMYCIN HCL IN DEXTROSE 1-5 GM/200ML-% IV SOLN
1000.0000 mg | Freq: Once | INTRAVENOUS | Status: AC
Start: 2016-09-14 — End: 2016-09-14
  Administered 2016-09-14: 1000 mg via INTRAVENOUS
  Filled 2016-09-14: qty 200

## 2016-09-14 MED ORDER — ISOSORBIDE MONONITRATE ER 30 MG PO TB24
30.0000 mg | ORAL_TABLET | Freq: Every day | ORAL | Status: DC
  Administered 2016-09-15 – 2016-09-23 (×8): 30 mg via ORAL
  Filled 2016-09-14 (×9): qty 1

## 2016-09-14 MED ORDER — AMLODIPINE BESYLATE 5 MG PO TABS
5.0000 mg | ORAL_TABLET | Freq: Every evening | ORAL | Status: DC
  Administered 2016-09-14 – 2016-09-23 (×9): 5 mg via ORAL
  Filled 2016-09-14 (×11): qty 1

## 2016-09-14 MED ORDER — ACETAMINOPHEN 325 MG PO TABS
650.0000 mg | ORAL_TABLET | Freq: Four times a day (QID) | ORAL | Status: DC | PRN
  Administered 2016-09-18: 650 mg via ORAL
  Filled 2016-09-14 (×2): qty 2

## 2016-09-14 MED ORDER — BUPIVACAINE HCL (PF) 0.5 % IJ SOLN
30.0000 mL | Freq: Once | INTRAMUSCULAR | Status: AC
  Administered 2016-09-14: 10 mL
  Filled 2016-09-14: qty 30

## 2016-09-14 MED ORDER — CEFEPIME-DEXTROSE 1 GM/50ML IV SOLR
1.0000 g | INTRAVENOUS | Status: DC
  Filled 2016-09-14: qty 50

## 2016-09-14 MED ORDER — DOXAZOSIN MESYLATE 8 MG PO TABS
8.0000 mg | ORAL_TABLET | Freq: Every day | ORAL | Status: DC
  Administered 2016-09-15 – 2016-09-23 (×8): 8 mg via ORAL
  Filled 2016-09-14 (×9): qty 1

## 2016-09-14 MED ORDER — INSULIN ASPART 100 UNIT/ML ~~LOC~~ SOLN
0.0000 [IU] | Freq: Three times a day (TID) | SUBCUTANEOUS | Status: DC
  Administered 2016-09-15: 1 [IU] via SUBCUTANEOUS
  Administered 2016-09-15 – 2016-09-17 (×4): 5 [IU] via SUBCUTANEOUS
  Administered 2016-09-17 – 2016-09-18 (×3): 3 [IU] via SUBCUTANEOUS
  Administered 2016-09-18: 2 [IU] via SUBCUTANEOUS
  Administered 2016-09-19: 1 [IU] via SUBCUTANEOUS
  Administered 2016-09-19: 3 [IU] via SUBCUTANEOUS
  Administered 2016-09-19: 2 [IU] via SUBCUTANEOUS
  Administered 2016-09-20: 1 [IU] via SUBCUTANEOUS
  Administered 2016-09-20: 5 [IU] via SUBCUTANEOUS
  Administered 2016-09-20: 2 [IU] via SUBCUTANEOUS
  Administered 2016-09-21: 5 [IU] via SUBCUTANEOUS
  Administered 2016-09-21: 2 [IU] via SUBCUTANEOUS
  Administered 2016-09-21: 1 [IU] via SUBCUTANEOUS
  Administered 2016-09-22: 3 [IU] via SUBCUTANEOUS
  Administered 2016-09-22: 5 [IU] via SUBCUTANEOUS
  Administered 2016-09-22 – 2016-09-23 (×2): 3 [IU] via SUBCUTANEOUS
  Administered 2016-09-23: 2 [IU] via SUBCUTANEOUS
  Filled 2016-09-14 (×23): qty 1

## 2016-09-14 MED ORDER — INSULIN DETEMIR 100 UNIT/ML ~~LOC~~ SOLN
40.0000 [IU] | Freq: Two times a day (BID) | SUBCUTANEOUS | Status: DC
  Filled 2016-09-14 (×2): qty 0.4

## 2016-09-14 MED ORDER — VANCOMYCIN HCL IN DEXTROSE 1-5 GM/200ML-% IV SOLN: 1000.0000 mg | INTRAVENOUS | Status: DC

## 2016-09-14 MED ORDER — INSULIN ASPART 100 UNIT/ML ~~LOC~~ SOLN
0.0000 [IU] | Freq: Every day | SUBCUTANEOUS | Status: DC
  Administered 2016-09-14: 5 [IU] via SUBCUTANEOUS
  Administered 2016-09-15 – 2016-09-17 (×2): 2 [IU] via SUBCUTANEOUS
  Administered 2016-09-17 – 2016-09-21 (×2): 3 [IU] via SUBCUTANEOUS
  Administered 2016-09-22: 2 [IU] via SUBCUTANEOUS
  Filled 2016-09-14 (×8): qty 1

## 2016-09-14 MED ORDER — SPIRONOLACTONE 25 MG PO TABS
25.0000 mg | ORAL_TABLET | Freq: Every day | ORAL | Status: DC
  Administered 2016-09-15 – 2016-09-17 (×3): 25 mg via ORAL
  Filled 2016-09-14 (×3): qty 1

## 2016-09-14 MED ORDER — ONDANSETRON HCL 4 MG/2ML IJ SOLN
4.0000 mg | Freq: Four times a day (QID) | INTRAMUSCULAR | Status: DC | PRN
  Administered 2016-09-17: 4 mg via INTRAVENOUS
  Filled 2016-09-14: qty 2

## 2016-09-14 MED ORDER — AMLODIPINE BESYLATE 5 MG PO TABS
10.0000 mg | ORAL_TABLET | Freq: Once | ORAL | Status: AC
  Administered 2016-09-14: 10 mg via ORAL
  Filled 2016-09-14: qty 2

## 2016-09-14 MED ORDER — CARVEDILOL 6.25 MG PO TABS
6.2500 mg | ORAL_TABLET | Freq: Two times a day (BID) | ORAL | Status: DC
  Administered 2016-09-14 – 2016-09-23 (×17): 6.25 mg via ORAL
  Filled 2016-09-14 (×18): qty 1

## 2016-09-14 MED ORDER — ASPIRIN EC 81 MG PO TBEC
81.0000 mg | DELAYED_RELEASE_TABLET | Freq: Every day | ORAL | Status: DC
  Administered 2016-09-15 – 2016-09-23 (×8): 81 mg via ORAL
  Filled 2016-09-14 (×8): qty 1

## 2016-09-14 MED ORDER — HEPARIN SODIUM (PORCINE) 5000 UNIT/ML IJ SOLN
5000.0000 [IU] | Freq: Three times a day (TID) | INTRAMUSCULAR | Status: DC
  Administered 2016-09-14 – 2016-09-23 (×25): 5000 [IU] via SUBCUTANEOUS
  Filled 2016-09-14 (×25): qty 1

## 2016-09-14 NOTE — ED Notes (Signed)
Chlorohex applied, Local marcaine administered by Quentin Cornwall MD

## 2016-09-14 NOTE — ED Notes (Signed)
Patient assisted to seated position by [employer]. Right side confirmed and marked by Quentin Cornwall MD

## 2016-09-14 NOTE — Progress Notes (Signed)
PHARMACIST - PHYSICIAN COMMUNICATION  CONCERNING: NPH insulin   ACTION:  Per [facility] policy NPH insulin has been changed to Levemir.   Pernell Dupre, PharmD, BCPS Clinical Pharmacist 09/14/2016 6:50 PM

## 2016-09-14 NOTE — ED Notes (Signed)
Catheter in place, + fluid return

## 2016-09-14 NOTE — ED Provider Notes (Addendum)
[facility] Emergency Department Provider Note    First MD Initiated Contact with Patient 09/14/16 1255     (approximate)  I have reviewed the triage vital signs and the nursing notes.   HISTORY  Chief Complaint Shortness of Breath    HPI Calvin Good is a 76 y.o. male history of prostate cancer, congestive heart failure, chronic kidney disease on Monday Wednesday Friday dialysis as well as COPD and history of CAD  presents to the ER with several days of worsening exertional dyspnea. Patient with multiple recent hospitalizations with a history of chronic oxygen requirement typically on 2 L of nasal cannula but has been having significant desaturations at home requiring increase titration of his oxygen. States that he is having worsening orthopnea and is limited from ambulating any more than a few steps without having to stop and rest. Went to dialysis yesterday without any complications. States that he's actually euvolemic based on his weight right now. He denies any chest pains. No fevers. States his swelling and pain from the pseudoaneurysm and hematoma has resolved. He went to the urgent care where they immediately sent him to the ER.   Past Medical History:  Diagnosis Date  . Cancer [facility])    Prostate  . CHF (congestive heart failure) ([place])   . Chronic kidney disease   . Complication of anesthesia    hard to wake up  . COPD (chronic obstructive pulmonary disease) ([place])   . Diabetes mellitus without complication ([place])   . Difficult intubation   . Dyspnea   . GERD (gastroesophageal reflux disease)   . Hyperlipidemia   . Hypertension    Family History  Problem Relation Age of Onset  . Cancer Father   . CAD Neg Hx   . Diabetes Neg Hx    Past Surgical History:  Procedure Laterality Date  . APPENDECTOMY    . AV FISTULA PLACEMENT Left 05/30/2016   Procedure: ARTERIOVENOUS (AV) FISTULA CREATION ( BRACHIOCEPHALIC );  Surgeon: [name], MD;   Location: [facility] ORS;  Service: Vascular;  Laterality: Left;  . CAPD INSERTION N/A 05/30/2016   Procedure: LAPAROSCOPIC INSERTION CONTINUOUS AMBULATORY PERITONEAL DIALYSIS  (CAPD) CATHETER;  Surgeon: [name], MD;  Location: [facility] ORS;  Service: Vascular;  Laterality: N/A;  . DIALYSIS/PERMA CATHETER INSERTION    . DIALYSIS/PERMA CATHETER REMOVAL N/A 08/21/2016   Procedure: Dialysis/Perma Catheter Removal;  Surgeon: [name], MD;  Location: [place] [facility];  Service: Cardiovascular;  Laterality: N/A;  . JOINT REPLACEMENT     right knee   . LEFT HEART CATH AND CORONARY ANGIOGRAPHY N/A 08/21/2016   Procedure: Left Heart Cath and Coronary Angiography possible PCI;  Surgeon: [name], MD;  Location: [place] [facility];  Service: Cardiovascular;  Laterality: N/A;  . postate removal     . PROSTATE SURGERY    . PSEUDOANERYSM COMPRESSION Right 08/27/2016   Procedure: Pseudoanerysm Compression;  Surgeon: Katha Cabal, MD;  Location: [place] [facility];  Service: Cardiovascular;  Laterality: Right;  . REMOVAL OF A DIALYSIS CATHETER N/A 08/08/2016   Procedure: REMOVAL OF A DIALYSIS CATHETER;  Surgeon: [name], MD;  Location: [facility] ORS;  Service: Vascular;  Laterality: N/A;   Patient Active Problem List   Diagnosis Date Noted  . HCAP (healthcare-associated pneumonia) 09/14/2016  . Aneurysm ([place]) 08/28/2016  . Pseudoaneurysm following procedure ([place]) 08/26/2016  . Chest tightness 08/20/2016  . GERD (gastroesophageal reflux disease) 08/20/2016  . NSTEMI (non-ST  elevated myocardial infarction) ([place]) 08/20/2016  . Onychomycosis of toenail 05/13/2016  . Essential hypertension, benign 04/23/2016  . Hyperlipemia 04/23/2016  . Diabetes ([place]) 04/23/2016  . ESRD on dialysis ([place]) 04/23/2016      Prior to Admission medications   Medication Sig Start Date End Date Taking? Authorizing Provider  amLODipine (NORVASC) 5 MG tablet Take 5 mg by mouth every evening.    Yes [provider]  aspirin 81 MG EC tablet Chew 81 mg by mouth daily.    Yes [provider]  atorvastatin (LIPITOR) 20 MG tablet Take 2 tablets (40 mg total) by mouth daily. Patient taking differently: Take 20 mg by mouth daily.  08/22/16  Yes Dustin Flock, MD  calcium acetate (PHOSLO) 667 MG capsule Take 1,334 mg by mouth 3 (three) times daily with meals.    Yes [provider]  carvedilol (COREG) 6.25 MG tablet Take 6.25 mg by mouth 2 (two) times daily. 09/05/16  Yes [provider]  doxazosin (CARDURA) 8 MG tablet Take 8 mg by mouth daily.   Yes [provider]  furosemide (LASIX) 40 MG tablet Take 40 mg by mouth daily.    Yes [provider]  insulin lispro (HUMALOG) 100 UNIT/ML injection Inject 10 Units into the skin 3 (three) times daily before meals.    Yes [provider]  insulin NPH Human (HUMULIN N,NOVOLIN N) 100 UNIT/ML injection Inject 65 Units into the skin 2 (two) times daily before a meal.    Yes [provider]  isosorbide mononitrate (IMDUR) 30 MG 24 hr tablet Take 1 tablet (30 mg total) by mouth daily. 08/22/16  Yes Dustin Flock, MD  lidocaine-prilocaine (EMLA) cream Apply 1 application topically as needed.   Yes [provider]  lisinopril (PRINIVIL,ZESTRIL) 20 MG tablet Take 20 mg by mouth daily.    Yes [provider]  omeprazole (PRILOSEC) 20 MG capsule Take 20 mg by mouth 2 (two) times daily before a meal.   Yes [provider]  spironolactone (ALDACTONE) 25 MG tablet Take 25 mg by mouth daily.   Yes [provider]    Allergies Tetracyclines & related and Morphine    Social History Social History  Substance Use Topics  . Smoking status: Former Smoker    Quit date: 05/21/1982  . Smokeless tobacco: Never Used  . Alcohol use 1.8 oz/week    3 Glasses of wine per week    Review of Systems Patient denies headaches, rhinorrhea, blurry vision, numbness, shortness of  breath, chest pain, edema, cough, abdominal pain, nausea, vomiting, diarrhea, dysuria, fevers, rashes or hallucinations unless otherwise stated above in HPI. ____________________________________________   PHYSICAL EXAM:  VITAL SIGNS: Vitals:   09/14/16 1656 09/14/16 1659  BP: 121/66 (!) 184/76  Pulse: 73   Resp: 16   Temp:      Constitutional: Alert and oriented. Chronically ill appearing and in no acute distress Eyes: Conjunctivae are normal.  Head: Atraumatic. Nose: No congestion/rhinnorhea. Mouth/Throat: Mucous membranes are moist.   Neck: No stridor. Painless ROM.  Cardiovascular: Normal rate, regular rhythm. Grossly normal heart sounds.  Good peripheral circulation. Respiratory: resting tachypnea with diminished bibasilar breathsounds Gastrointestinal: Soft and nontender. No distention. No abdominal bruits. No CVA tenderness. Musculoskeletal: No lower extremity tenderness.  + bilateral LE edema.  No joint effusions.  Non tender non mobile mass and knot on left anterior chest wall Neurologic:  Normal speech and language. No gross focal neurologic deficits are appreciated. No facial droop Skin:  Skin is warm, dry and intact. No rash noted. Psychiatric: Mood and affect are normal. Speech and behavior are normal.  ____________________________________________   LABS (all labs ordered are listed, but only abnormal results are displayed)  Results for orders placed or performed during the hospital encounter of 09/14/16 (from the past 24 hour(s))  CBC with Differential/Platelet     Status: Abnormal   Collection Time: 09/14/16 12:49 PM  Result Value Ref Range   WBC 8.2 3.8 - 10.6 K/uL   RBC 3.39 (L) 4.40 - 5.90 MIL/uL   Hemoglobin 10.3 (L) 13.0 - 18.0 g/dL   HCT 29.4 (L) 40.0 - 52.0 %   MCV 86.9 80.0 - 100.0 fL   MCH 30.3 26.0 - 34.0 pg   MCHC 34.9 32.0 - 36.0 g/dL   RDW 14.2 11.5 - 14.5 %   Platelets 175 150 - 440 K/uL   Neutrophils Relative % 71 %   Neutro Abs 5.8 1.4 -  6.5 K/uL   Lymphocytes Relative 20 %   Lymphs Abs 1.6 1.0 - 3.6 K/uL   Monocytes Relative 7 %   Monocytes Absolute 0.5 0.2 - 1.0 K/uL   Eosinophils Relative 3 %   Eosinophils Absolute 0.2 0 - 0.7 K/uL   Basophils Relative 1 %   Basophils Absolute 0.1 0 - 0.1 K/uL  Basic metabolic panel     Status: Abnormal   Collection Time: 09/14/16 12:49 PM  Result Value Ref Range   Sodium 134 (L) 135 - 145 mmol/L   Potassium 3.9 3.5 - 5.1 mmol/L   Chloride 96 (L) 101 - 111 mmol/L   CO2 28 22 - 32 mmol/L   Glucose, Bld 280 (H) 65 - 99 mg/dL   BUN 27 (H) 6 - 20 mg/dL   Creatinine, Ser 3.98 (H) 0.61 - 1.24 mg/dL   Calcium 8.9 8.9 - 10.3 mg/dL   GFR calc non Af Amer 13 (L) >60 mL/min   GFR calc Af Amer 15 (L) >60 mL/min   Anion gap 10 5 - 15  Brain natriuretic peptide     Status: Abnormal   Collection Time: 09/14/16 12:49 PM  Result Value Ref Range   B Natriuretic Peptide 639.0 (H) 0.0 - 100.0 pg/mL  Troponin I     Status: None   Collection Time: 09/14/16 12:49 PM  Result Value Ref Range   Troponin I <0.03 <0.03 ng/mL  Glucose, pleural or peritoneal fluid     Status: None   Collection Time: 09/14/16  3:47 PM  Result Value Ref Range   Glucose, Fluid 269 mg/dL   Fluid Type-FGLU PLEURAL   Protein, pleural or peritoneal fluid     Status: None   Collection Time: 09/14/16  3:47 PM  Result Value Ref Range   Total protein, fluid <3.0 g/dL   Fluid Type-FTP PLEURAL   Lactate dehydrogenase (pleural or peritoneal fluid)     Status: Abnormal   Collection Time: 09/14/16  3:47 PM  Result Value Ref Range   LD, Fluid 68 (H) 3 - 23 U/L   Fluid Type-FLDH PLEURAL    ____________________________________________  EKG My review and personal interpretation at Time: 12:45   Indication: sob  Rate: 70  Rhythm: sinus Axis: normal Other: RBBB, inferior t wave changes consistent with previous 5/25 ____________________________________________  RADIOLOGY  I personally reviewed all radiographic images ordered  to evaluate for the above acute complaints and reviewed radiology reports and findings.  These findings were personally discussed with the patient.  Please see  medical record for radiology report.  ____________________________________________   PROCEDURES  Procedure(s) performed:  THORACENTESIS BEDSIDE Date/Time: 09/14/2016 5:15 PM Performed by: Merlyn Lot Authorized by: Merlyn Lot   Consent:    Consent obtained:  Written   Consent given by:  Patient   Risks discussed:  Bleeding, incomplete drainage, infection, nerve damage, pain and pneumothorax   Alternatives discussed:  No treatment Procedure details:    Patient position:  Sitting   Location:  R midscapular line   Intercostal space:  7th   Puncture method:  Over-the-needle catheter   Ultrasound guidance: yes     Indwelling catheter placed: no     Needle gauge:  18   Catheter size:  8 Fr   Number of attempts:  1   Drainage characteristics:  Clear Post-procedure details:    Chest x-ray performed: yes     Chest x-ray findings:  Pleural effusion improved   Patient tolerance of procedure:  Tolerated well, no immediate complications      Critical Care performed: no ____________________________________________   INITIAL IMPRESSION / ASSESSMENT AND PLAN / ED COURSE  Pertinent labs & imaging results that were available during my care of the patient were reviewed by me and considered in my medical decision making (see chart for details).  DDX: Asthma, copd, CHF, pna, ptx, malignancy, Pe, anemia   Jondavid Schreier is a 76 y.o. who presents to the ED with with several weeks of worsening dyspnea and acute on chronic respiratory failure with hypoxia resents for severe dyspnea. She afebrile but is in moderate respiratory distress with absent breath sounds on the right. Chest x-ray shows evidence of persistent effusion and given his failure of improvement with dialysis CT imaging ordered to evaluate for mass,  consolidation, located effusion. There is a large right-sided pleural effusion with dense consolidation of the right lower lobe. Fluid will be sent for culture and studies after thoracentesis  Clinical Course as of Sep 14 1725  Sat Sep 14, 2016  1614 Patient tolerated thoracentesis without any, location. Does feel some subjective improvement in dyspnea.  [PR]    Clinical Course User Index [PR] Merlyn Lot, MD   Have discussed with the patient and available family all diagnostics and treatments performed thus far and all questions were answered to the best of my ability. The patient demonstrates understanding and agreement with plan.   ____________________________________________   FINAL CLINICAL IMPRESSION(S) / ED DIAGNOSES  Final diagnoses:  Shortness of breath  Pleural effusion  Acute on chronic respiratory failure with hypoxia ([place])  HCAP (healthcare-associated pneumonia)      NEW MEDICATIONS STARTED DURING THIS VISIT:  New Prescriptions   No medications on file     Note:  This document was prepared using Dragon voice recognition software and may include unintentional dictation errors.    Merlyn Lot, MD 09/14/16 1726    Merlyn Lot, MD 09/14/16 [phone]

## 2016-09-14 NOTE — ED Notes (Signed)
60cc initial draw complete

## 2016-09-14 NOTE — Progress Notes (Addendum)
  Family Meeting Note  Advance Directive:yes  Today a meeting took place with the Patient, daughter at bedside     The following clinical team members were present during this meeting:MD  The following were discussed:Patient's diagnosis: Plan of care discussed with the patient and daughter at bedside , Patient's progosis: Unable to determine and Goals for treatment: Full Code, daughter is the healthcare power of attorney  Additional follow-up to be provided: M.D.  Time spent during discussion:16 min  Calvin Good, Illene Silver, MD

## 2016-09-14 NOTE — H&P (Signed)
Calvin Good    MR#:  [id number]  DATE OF BIRTH:  11-18-1940  DATE OF ADMISSION:  09/14/2016  PRIMARY CARE PHYSICIAN: [name], MD   REQUESTING/REFERRING PHYSICIAN: Dr. Quentin Cornwall  CHIEF COMPLAINT:   Shortness of breath HISTORY OF PRESENT ILLNESS:  Calvin Good  is a 76 y.o. male with a known history of End-stage renal disease on hemodialysis, diabetes mellitus, hypertension and other medical problems is presenting to the ED with a chief complaint of shortness of breath. Chest x-ray has revealed borderline mild CHF, patchy right lung base opacity within the right and left pleural effusions. Patient had a right-sided thoracentesis in the ED by the ED physician and one and half liters of amber-colored fluid was collected and patient started feeling better. Repeat chest x-ray with no pneumothorax. Hospitalist team is called to admit the patient and patient is started on empiric antibiotics  PAST MEDICAL HISTORY:   Past Medical History:  Diagnosis Date  . Cancer [facility])    Prostate  . CHF (congestive heart failure) ([place])   . Chronic kidney disease   . Complication of anesthesia    hard to wake up  . COPD (chronic obstructive pulmonary disease) ([place])   . Diabetes mellitus without complication ([place])   . Difficult intubation   . Dyspnea   . GERD (gastroesophageal reflux disease)   . Hyperlipidemia   . Hypertension     PAST SURGICAL HISTOIRY:   Past Surgical History:  Procedure Laterality Date  . APPENDECTOMY    . AV FISTULA PLACEMENT Left 05/30/2016   Procedure: ARTERIOVENOUS (AV) FISTULA CREATION ( BRACHIOCEPHALIC );  Surgeon: [name], MD;  Location: [facility] ORS;  Service: Vascular;  Laterality: Left;  . CAPD INSERTION N/A 05/30/2016   Procedure: LAPAROSCOPIC INSERTION CONTINUOUS AMBULATORY PERITONEAL DIALYSIS  (CAPD) CATHETER;  Surgeon: [name], MD;  Location: [facility] ORS;  Service: Vascular;   Laterality: N/A;  . DIALYSIS/PERMA CATHETER INSERTION    . DIALYSIS/PERMA CATHETER REMOVAL N/A 08/21/2016   Procedure: Dialysis/Perma Catheter Removal;  Surgeon: [name], MD;  Location: [place] CV LAB;  Service: Cardiovascular;  Laterality: N/A;  . JOINT REPLACEMENT     right knee   . LEFT HEART CATH AND CORONARY ANGIOGRAPHY N/A 08/21/2016   Procedure: Left Heart Cath and Coronary Angiography possible PCI;  Surgeon: [name], MD;  Location: [place] [facility] LAB;  Service: Cardiovascular;  Laterality: N/A;  . postate removal     . PROSTATE SURGERY    . PSEUDOANERYSM COMPRESSION Right 08/27/2016   Procedure: Pseudoanerysm Compression;  Surgeon: Katha Cabal, MD;  Location: [place] [facility];  Service: Cardiovascular;  Laterality: Right;  . REMOVAL OF A DIALYSIS CATHETER N/A 08/08/2016   Procedure: REMOVAL OF A DIALYSIS CATHETER;  Surgeon: [name], MD;  Location: [facility] ORS;  Service: Vascular;  Laterality: N/A;    SOCIAL HISTORY:   Social History  Substance Use Topics  . Smoking status: Former Smoker    Quit date: 05/21/1982  . Smokeless tobacco: Never Used  . Alcohol use 1.8 oz/week    3 Glasses of wine per week    FAMILY HISTORY:   Family History  Problem Relation Age of Onset  . Cancer Father   . CAD Neg Hx   . Diabetes Neg Hx     DRUG ALLERGIES:   Allergies  Allergen Reactions  . Tetracyclines & Related Other (See Comments)  Reaction: unknown happened a long time ago and pt doesn't remember reaction   . Morphine Other (See Comments)    Reaction: confusion    REVIEW OF SYSTEMS:  CONSTITUTIONAL: No fever, fatigue or weakness.  EYES: No blurred or double vision.  EARS, NOSE, AND THROAT: No tinnitus or ear pain.  RESPIRATORY: reports cough, shortness of breath, nowheezing or hemoptysis.  CARDIOVASCULAR: No chest pain, orthopnea, edema.  GASTROINTESTINAL: No nausea, vomiting, diarrhea or abdominal pain.  GENITOURINARY: No dysuria,  hematuria.  ENDOCRINE: No polyuria, nocturia,  HEMATOLOGY: No anemia, easy bruising or bleeding SKIN: No rash or lesion. MUSCULOSKELETAL: No joint pain or arthritis.   NEUROLOGIC: No tingling, numbness, weakness.  PSYCHIATRY: No anxiety or depression.   MEDICATIONS AT HOME:   Prior to Admission medications   Medication Sig Start Date End Date Taking? Authorizing Provider  amLODipine (NORVASC) 5 MG tablet Take 5 mg by mouth every evening.    Yes [provider]  aspirin 81 MG EC tablet Chew 81 mg by mouth daily.    Yes [provider]  atorvastatin (LIPITOR) 20 MG tablet Take 2 tablets (40 mg total) by mouth daily. Patient taking differently: Take 20 mg by mouth daily.  08/22/16  Yes Dustin Flock, MD  calcium acetate (PHOSLO) 667 MG capsule Take 1,334 mg by mouth 3 (three) times daily with meals.    Yes [provider]  carvedilol (COREG) 6.25 MG tablet Take 6.25 mg by mouth 2 (two) times daily. 09/05/16  Yes [provider]  doxazosin (CARDURA) 8 MG tablet Take 8 mg by mouth daily.   Yes [provider]  furosemide (LASIX) 40 MG tablet Take 40 mg by mouth daily.    Yes [provider]  insulin lispro (HUMALOG) 100 UNIT/ML injection Inject 10 Units into the skin 3 (three) times daily before meals.    Yes [provider]  insulin NPH Human (HUMULIN N,NOVOLIN N) 100 UNIT/ML injection Inject 65 Units into the skin 2 (two) times daily before a meal.    Yes [provider]  isosorbide mononitrate (IMDUR) 30 MG 24 hr tablet Take 1 tablet (30 mg total) by mouth daily. 08/22/16  Yes Dustin Flock, MD  lidocaine-prilocaine (EMLA) cream Apply 1 application topically as needed.   Yes [provider]  lisinopril (PRINIVIL,ZESTRIL) 20 MG tablet Take 20 mg by mouth daily.    Yes [provider]  omeprazole (PRILOSEC) 20 MG capsule Take 20 mg by mouth 2 (two) times daily before a meal.   Yes [provider]   spironolactone (ALDACTONE) 25 MG tablet Take 25 mg by mouth daily.   Yes [provider]      VITAL SIGNS:  Blood pressure (!) 184/76, pulse 73, temperature 97.4 F (36.3 C), temperature source Oral, resp. rate 16, height 5\' 9"  (1.753 m), weight 106.6 kg (235 lb), SpO2 95 %.  PHYSICAL EXAMINATION:  GENERAL:  76 y.o.-year-old patient lying in the bed with no acute distress.  EYES: Pupils equal, round, reactive to light and accommodation. No scleral icterus. Extraocular muscles intact.  HEENT: Head atraumatic, normocephalic. Oropharynx and nasopharynx clear.  NECK:  Supple, no jugular venous distention. No thyroid enlargement, no tenderness.  LUNGS: Moderate breath sounds bilaterally, no wheezing, has bilateral lower lobe rales,rhonchi or crepitation. No use of accessory muscles of respiration.  CARDIOVASCULAR: S1, S2 normal. No murmurs, rubs, or gallops.  ABDOMEN: Soft, nontender, nondistended. Bowel sounds present. No organomegaly or mass.  EXTREMITIES: 1+ pedal edema, no cyanosis,  or clubbing.  NEUROLOGIC: Cranial nerves II through XII are intact. Muscle strength 5/5 in all extremities. Sensation intact. Gait not checked.  PSYCHIATRIC: The patient is alert and oriented x 3.  SKIN: No obvious rash, lesion, or ulcer.   LABORATORY PANEL:   CBC  Recent Labs Lab 09/14/16 1249  WBC 8.2  HGB 10.3*  HCT 29.4*  PLT 175   ------------------------------------------------------------------------------------------------------------------  Chemistries   Recent Labs Lab 09/14/16 1249  NA 134*  K 3.9  CL 96*  CO2 28  GLUCOSE 280*  BUN 27*  CREATININE 3.98*  CALCIUM 8.9   ------------------------------------------------------------------------------------------------------------------  Cardiac Enzymes  Recent Labs Lab 09/14/16 1249  TROPONINI <0.03    ------------------------------------------------------------------------------------------------------------------  RADIOLOGY:  Dg Chest 2 View  Result Date: 09/14/2016 CLINICAL DATA:  Dyspnea EXAM: CHEST  2 VIEW COMPARISON:  08/19/2016 chest radiograph. FINDINGS: Stable cardiomediastinal silhouette with mild cardiomegaly and aortic atherosclerosis. No pneumothorax. Small right pleural effusion. Trace left pleural effusion. Borderline mild pulmonary edema. Patchy right lung base opacity. IMPRESSION: 1. Borderline mild congestive heart failure. 2. Small right and trace left pleural effusions. 3. Patchy right lung base opacity, favor atelectasis, difficult to exclude a component of aspiration or pneumonia. Electronically Signed   By: Ilona Sorrel M.D.   On: 09/14/2016 13:50   Ct Chest Wo Contrast  Result Date: 09/14/2016 CLINICAL DATA:  Increasing dyspnea. EXAM: CT CHEST WITHOUT CONTRAST TECHNIQUE: Multidetector CT imaging of the chest was performed following the standard protocol without IV contrast. COMPARISON:  Chest radiograph 09/14/2016 FINDINGS: Cardiovascular: Mildly enlarged heart size. No pericardial effusion. Heavy calcific atherosclerotic disease of the aorta and coronary arteries. Tortuosity of the aorta. Mediastinum/Nodes: Bilateral mediastinal lymph nodes with borderline thickening measuring up to 1.5 cm short axis. Similarly mildly prominent bilateral hilar lymph nodes. Lungs/Pleura: Large in size right pleural effusion. Right lower lobe peribronchial airspace consolidation. Minimal left pleural effusion. Minimal interstitial pulmonary edema. Upper Abdomen: Cholelithiasis. Visualized portion of the upper abdominal organs otherwise normal. Musculoskeletal: No chest wall mass or suspicious bone lesions identified. IMPRESSION: Mildly enlarged cardiac silhouette. Calcific atherosclerotic disease of the coronary arteries. Large right pleural effusion. Right lower lobe peribronchial airspace  consolidation may represent atelectasis or infectious consolidation. Tiny left pleural effusion. Minimal interstitial pulmonary edema. Mediastinal and hilar lymphadenopathy, which may be reactive or malignant. Aortic Atherosclerosis (ICD10-I70.0). Electronically Signed   By: Fidela Salisbury M.D.   On: 09/14/2016 14:45   Dg Chest Portable 1 View  Result Date: 09/14/2016 CLINICAL DATA:  POST THORACENTESIS. EXAM: PORTABLE CHEST 1 VIEW COMPARISON:  09/14/2016 FINDINGS: The heart is enlarged. There are interstitial changes consistent with pulmonary edema. There has been improvement in the aeration at right lung base since the prior study. No pneumothorax. IMPRESSION: Interstitial pulmonary edema. Improved aeration in the right lower lobe.  No pneumothorax. Electronically Signed   By: Nolon Nations M.D.   On: 09/14/2016 17:27    EKG:   Orders placed or performed during the hospital encounter of 09/14/16  . ED EKG  . ED EKG    IMPRESSION AND PLAN:   Calvin Good  is a 76 y.o. male with a known history of End-stage renal disease on hemodialysis, diabetes mellitus, hypertension and other medical problems is presenting to the ED with a chief complaint of shortness of breath. Chest x-ray has revealed borderline mild CHF, patchy right lung base opacity within the right and left pleural effusions. Patient had a right-sided thoracentesis in the ED by the ED physician and one and half  liters of amber-colored fluid was collected and patient started feeling better  #Acute respiratory distress secondary to healthcare associated pneumonia with parapneumonic effusions Admit to MedSurg unit Status post right-sided thoracentesis in the ED and one and half liters of fluid extracted Sputum culture and sensitivity and empiric IV antibiotics with cefepime and vancomycin  #Mild CHF exacerbation IV Lasix, continue home medication Aldactone, Coreg Intake and output Daily weights  #Diabetes mellitus Continue  NPH insulin changed to 50 units from 65 Sliding scale insulin  # hypertension continue Coreg, Cardura and Aldactone On IV Lasix    All the records are reviewed and case discussed with ED provider. Management plans discussed with the patient, family and they are in agreement.  CODE STATUS: fc, daughter is the healthcare power of attorney  TOTAL TIME TAKING CARE OF THIS PATIENT: 45 minutes.   Note: This dictation was prepared with Dragon dictation along with smaller phrase technology. Any transcriptional errors that result from this process are unintentional.  Nicholes Mango M.D on 09/14/2016 at 5:30 PM  Between 7am to 6pm - Pager - [phone]  After 6pm go to [email or web address] - password [email or web address] [facility]  [place] [facility]  Office  [phone]  CC: Primary care physician; [name], MD

## 2016-09-14 NOTE — ED Triage Notes (Signed)
Pt was placed on continuous oxygen during a hospital stay in May, pt complains of increased dyspnea, denies pain pt had dialysis yesterday, pt is dyspneic at rest , able to speak full sentence

## 2016-09-14 NOTE — Progress Notes (Signed)
Pharmacy Antibiotic Note  Calvin Good is a 76 y.o. male admitted on 09/14/2016 with pneumonia/HCAP.  Pharmacy has been consulted for Vancomycin and cefepime dosing. Patient received vancomycin 1gm IV and cefepime 2g IV x 1 dose. Patient has ESRD and received HD MWF. Patient aldo had recent admission on 5/28   Plan: Will order additional Vancomycin 1g IV (total: 2g loading dose), followed by vancomycin 1g IV every HD session (MWF). Random vanc level prior to 3rd HD.  MRSA PCR screen from 5/8 was negative. Recommend discontinuation of vancomycin.   Will start the patient on cefepime 1g IV daily, given after HD on HD days.    Height: 5\' 9"  (175.3 cm) Weight: 235 lb (106.6 kg) IBW/kg (Calculated) : 70.7  Temp (24hrs), Avg:97.4 F (36.3 C), Min:97.4 F (36.3 C), Max:97.4 F (36.3 C)   Recent Labs Lab 09/14/16 1249  WBC 8.2  CREATININE 3.98*    Estimated Creatinine Clearance: 18.4 mL/min (A) (by C-G formula based on SCr of 3.98 mg/dL (H)).    Allergies  Allergen Reactions  . Tetracyclines & Related Other (See Comments)    Reaction: unknown happened a long time ago and pt doesn't remember reaction   . Morphine Other (See Comments)    Reaction: confusion    Antimicrobials this admission: 6/16 vancomycin  >>  6/16 Cefepime >>   Dose adjustments this admission:  Microbiology results: 6/16 BCx: sent 5/8  MRSA PCR: negative   Thank you for allowing pharmacy to be a part of this patient's care.  Pernell Dupre, PharmD, BCPS Clinical Pharmacist 09/14/2016 5:48 PM

## 2016-09-14 NOTE — ED Notes (Signed)
Thoracentesis event beginning. Quentin Cornwall MD, Swindle RN, Christoper Fabian RN at bedside. Sterile procedure confirmed, time out complete.

## 2016-09-14 NOTE — ED Notes (Signed)
Incision made, patient tolerated well

## 2016-09-14 NOTE — ED Notes (Signed)
1020ml out at this point. Patient tolerating well.

## 2016-09-14 NOTE — ED Notes (Signed)
Dr Quentin Cornwall has evaluated pt - pt states that he started a few days ago with increased shortness of breath with any activity - he is concerned that the fluid is building up around his lungs despite dialysis 3 times a week (last went yesterday with a normal amount of fluid pulled off)

## 2016-09-15 ENCOUNTER — Inpatient Hospital Stay: Payer: Medicare Other

## 2016-09-15 LAB — COMPREHENSIVE METABOLIC PANEL
ALT: 13 U/L — AB (ref 17–63)
AST: 13 U/L — AB (ref 15–41)
Albumin: 3.4 g/dL — ABNORMAL LOW (ref 3.5–5.0)
Alkaline Phosphatase: 54 U/L (ref 38–126)
Anion gap: 8 (ref 5–15)
BILIRUBIN TOTAL: 0.9 mg/dL (ref 0.3–1.2)
BUN: 39 mg/dL — AB (ref 6–20)
CO2: 28 mmol/L (ref 22–32)
CREATININE: 4.81 mg/dL — AB (ref 0.61–1.24)
Calcium: 8.6 mg/dL — ABNORMAL LOW (ref 8.9–10.3)
Chloride: 99 mmol/L — ABNORMAL LOW (ref 101–111)
GFR, EST AFRICAN AMERICAN: 12 mL/min — AB (ref >60)
GFR, EST NON AFRICAN AMERICAN: 10 mL/min — AB (ref >60)
Glucose, Bld: 260 mg/dL — ABNORMAL HIGH (ref 65–99)
Potassium: 3.5 mmol/L (ref 3.5–5.1)
Sodium: 135 mmol/L (ref 135–145)
TOTAL PROTEIN: 6.5 g/dL (ref 6.5–8.1)

## 2016-09-15 LAB — GLUCOSE, CAPILLARY
GLUCOSE-CAPILLARY: 282 mg/dL — AB (ref 65–99)
Glucose-Capillary: 275 mg/dL — ABNORMAL HIGH (ref 65–99)
Glucose-Capillary: 145 mg/dL — ABNORMAL HIGH (ref 65–99)
Glucose-Capillary: 218 mg/dL — ABNORMAL HIGH (ref 65–99)

## 2016-09-15 LAB — MISC LABCORP TEST (SEND OUT): Labcorp test code: 19588

## 2016-09-15 LAB — BLOOD CULTURE ID PANEL (REFLEXED)
Acinetobacter baumannii: NOT DETECTED
CANDIDA GLABRATA: NOT DETECTED
CANDIDA TROPICALIS: NOT DETECTED
Candida albicans: NOT DETECTED
Candida krusei: NOT DETECTED
Candida parapsilosis: NOT DETECTED
Enterobacter cloacae complex: NOT DETECTED
Enterobacteriaceae species: NOT DETECTED
Enterococcus species: NOT DETECTED
Escherichia coli: NOT DETECTED
HAEMOPHILUS INFLUENZAE: NOT DETECTED
KLEBSIELLA PNEUMONIAE: NOT DETECTED
Klebsiella oxytoca: NOT DETECTED
Listeria monocytogenes: NOT DETECTED
NEISSERIA MENINGITIDIS: NOT DETECTED
PROTEUS SPECIES: NOT DETECTED
Pseudomonas aeruginosa: NOT DETECTED
SERRATIA MARCESCENS: NOT DETECTED
STREPTOCOCCUS AGALACTIAE: NOT DETECTED
STREPTOCOCCUS SPECIES: NOT DETECTED
Staphylococcus aureus (BCID): NOT DETECTED
Staphylococcus species: NOT DETECTED
Streptococcus pneumoniae: NOT DETECTED
Streptococcus pyogenes: NOT DETECTED

## 2016-09-15 LAB — CBC
HEMATOCRIT: 29 % — AB (ref 40.0–52.0)
Hemoglobin: 9.9 g/dL — ABNORMAL LOW (ref 13.0–18.0)
MCH: 29.7 pg (ref 26.0–34.0)
MCHC: 34.1 g/dL (ref 32.0–36.0)
MCV: 87 fL (ref 80.0–100.0)
Platelets: 160 10*3/uL (ref 150–440)
RBC: 3.34 MIL/uL — AB (ref 4.40–5.90)
RDW: 14 % (ref 11.5–14.5)
WBC: 10.5 10*3/uL (ref 3.8–10.6)

## 2016-09-15 MED ORDER — IPRATROPIUM-ALBUTEROL 0.5-2.5 (3) MG/3ML IN SOLN
3.0000 mL | Freq: Four times a day (QID) | RESPIRATORY_TRACT | Status: DC | PRN
  Administered 2016-09-15 – 2016-09-17 (×3): 3 mL via RESPIRATORY_TRACT
  Filled 2016-09-15 (×4): qty 3

## 2016-09-15 MED ORDER — HYDROCOD POLST-CPM POLST ER 10-8 MG/5ML PO SUER
5.0000 mL | Freq: Two times a day (BID) | ORAL | Status: DC
  Administered 2016-09-15 – 2016-09-17 (×5): 5 mL via ORAL
  Filled 2016-09-15 (×5): qty 5

## 2016-09-15 MED ORDER — DEXTROSE 5 % IV SOLN
1.0000 g | INTRAVENOUS | Status: DC
  Administered 2016-09-15 – 2016-09-16 (×2): 1 g via INTRAVENOUS
  Filled 2016-09-15 (×3): qty 1

## 2016-09-15 MED ORDER — HYDROCOD POLST-CPM POLST ER 10-8 MG/5ML PO SUER: 5.0000 mL | Freq: Two times a day (BID) | ORAL | Status: DC

## 2016-09-15 MED ORDER — CHLORHEXIDINE GLUCONATE CLOTH 2 % EX PADS
6.0000 | MEDICATED_PAD | Freq: Every day | CUTANEOUS | Status: AC
  Administered 2016-09-16 – 2016-09-20 (×5): 6 via TOPICAL

## 2016-09-15 MED ORDER — ALPRAZOLAM 0.25 MG PO TABS
0.2500 mg | ORAL_TABLET | Freq: Three times a day (TID) | ORAL | Status: DC | PRN
  Administered 2016-09-15: 0.25 mg via ORAL
  Filled 2016-09-15: qty 1

## 2016-09-15 MED ORDER — MUPIROCIN 2 % EX OINT
1.0000 "application " | TOPICAL_OINTMENT | Freq: Two times a day (BID) | CUTANEOUS | Status: AC
  Administered 2016-09-15 – 2016-09-20 (×10): 1 via NASAL
  Filled 2016-09-15 (×2): qty 22

## 2016-09-15 MED ORDER — BENZONATATE 100 MG PO CAPS
100.0000 mg | ORAL_CAPSULE | Freq: Three times a day (TID) | ORAL | Status: DC
  Administered 2016-09-15 – 2016-09-23 (×21): 100 mg via ORAL
  Filled 2016-09-15 (×23): qty 1

## 2016-09-15 MED ORDER — GUAIFENESIN-DM 100-10 MG/5ML PO SYRP
5.0000 mL | ORAL_SOLUTION | ORAL | Status: DC | PRN
  Administered 2016-09-15: 5 mL via ORAL
  Filled 2016-09-15 (×3): qty 5

## 2016-09-15 NOTE — Progress Notes (Signed)
[facility]  ROUNDING NOTE   Subjective:   [name] admitted to [facility] on 09/14/2016 for Shortness of breath [R06.02] Pleural effusion [J90] HCAP (healthcare-associated pneumonia) [J18.9] Acute on chronic respiratory failure with hypoxia ([place]) [J96.21]  Patient found to have right sided pleural effusion. Thoracentesis done removing 1060 mL pleural fluid removed.  Patient continues to have wheezing and shortness of breath.   Objective:  Vital signs in last 24 hours:  Temp:  [97.8 F (36.6 C)-98.2 F (36.8 C)] 98.2 F (36.8 C) (06/17 1223) Pulse Rate:  [71-95] 79 (06/17 1223) Resp:  [10-25] 18 (06/17 1223) BP: (121-234)/(46-147) 156/54 (06/17 1223) SpO2:  [90 %-100 %] 90 % (06/17 1223)  Weight change:  Filed Weights   09/14/16 1240  Weight: 106.6 kg (235 lb)    Intake/Output: I/O last 3 completed shifts: In: 450 [P.O.:250; IV Piggyback:200] Out: 100 [Urine:100]   Intake/Output this shift:  No intake/output data recorded.  Physical Exam: General: NAD,    Head: Normocephalic, atraumatic. Moist oral mucosal membranes  Eyes: Anicteric,    Neck: Supple, trachea midline  Lungs:  Bilateral wheezing  Heart: Regular rate and rhythm  Abdomen:  Soft, nontender,   Extremities: no peripheral edema.  Neurologic: Nonfocal, moving all four extremities  Skin: No lesions  GU: Right femoral hematoma  Access: Left upper arm AVF     Basic Metabolic Panel:  Recent Labs Lab 09/14/16 1249 09/15/16 0510  NA 134* 135  K 3.9 3.5  CL 96* 99*  CO2 28 28  GLUCOSE 280* 260*  BUN 27* 39*  CREATININE 3.98* 4.81*  CALCIUM 8.9 8.6*    Liver Function Tests:  Recent Labs Lab 09/15/16 0510  AST 13*  ALT 13*  ALKPHOS 54  BILITOT 0.9  PROT 6.5  ALBUMIN 3.4*   No results for input(s): LIPASE, AMYLASE in the last 168 hours. No results for input(s): AMMONIA in the last 168 hours.  CBC:  Recent Labs Lab 09/14/16 1249 09/15/16 0510  WBC 8.2 10.5   NEUTROABS 5.8  --   HGB 10.3* 9.9*  HCT 29.4* 29.0*  MCV 86.9 87.0  PLT 175 160    Cardiac Enzymes:  Recent Labs Lab 09/14/16 1249  TROPONINI <0.03    BNP: Invalid input(s): POCBNP  CBG:  Recent Labs Lab 09/14/16 1755 09/14/16 2014 09/15/16 0731 09/15/16 1127  GLUCAP 238* 388* 282* 275*    Microbiology: Results for orders placed or performed during the hospital encounter of 09/14/16  Blood Culture (routine x 2)     Status: None (Preliminary result)   Collection Time: 09/14/16  2:58 PM  Result Value Ref Range Status   Specimen Description BLOOD BLOOD LEFT HAND  Final   Special Requests   Final    BOTTLES DRAWN AEROBIC AND ANAEROBIC Blood Culture adequate volume   Culture  Setup Time   Final    GRAM POSITIVE RODS AEROBIC BOTTLE ONLY CRITICAL RESULT CALLED TO, READ BACK BY AND VERIFIED WITH: MATT MCBANE AT 3500 ON 09/15/16 RWW CONFIRMED BY [facility] Performed at [facility], [place] [address] [place], [place] [address]    Culture GRAM POSITIVE RODS  Final   Report Status PENDING  Incomplete  Blood Culture ID Panel (Reflexed)     Status: None   Collection Time: 09/14/16  2:58 PM  Result Value Ref Range Status   Enterococcus species NOT DETECTED NOT DETECTED Final   Listeria monocytogenes NOT DETECTED NOT DETECTED Final   Staphylococcus species NOT DETECTED  NOT DETECTED Final   Staphylococcus aureus NOT DETECTED NOT DETECTED Final   Streptococcus species NOT DETECTED NOT DETECTED Final   Streptococcus agalactiae NOT DETECTED NOT DETECTED Final   Streptococcus pneumoniae NOT DETECTED NOT DETECTED Final   Streptococcus pyogenes NOT DETECTED NOT DETECTED Final   Acinetobacter baumannii NOT DETECTED NOT DETECTED Final   Enterobacteriaceae species NOT DETECTED NOT DETECTED Final   Enterobacter cloacae complex NOT DETECTED NOT DETECTED Final   Escherichia coli NOT DETECTED NOT DETECTED Final   Klebsiella oxytoca NOT DETECTED NOT DETECTED Final   Klebsiella  pneumoniae NOT DETECTED NOT DETECTED Final   Proteus species NOT DETECTED NOT DETECTED Final   Serratia marcescens NOT DETECTED NOT DETECTED Final   Haemophilus influenzae NOT DETECTED NOT DETECTED Final   Neisseria meningitidis NOT DETECTED NOT DETECTED Final   Pseudomonas aeruginosa NOT DETECTED NOT DETECTED Final   Candida albicans NOT DETECTED NOT DETECTED Final   Candida glabrata NOT DETECTED NOT DETECTED Final   Candida krusei NOT DETECTED NOT DETECTED Final   Candida parapsilosis NOT DETECTED NOT DETECTED Final   Candida tropicalis NOT DETECTED NOT DETECTED Final  Body fluid culture (includes gram stain)     Status: None (Preliminary result)   Collection Time: 09/14/16  3:47 PM  Result Value Ref Range Status   Specimen Description PLEURAL  Final   Special Requests PLEURAL  Final   Gram Stain   Final    RARE WBC PRESENT, PREDOMINANTLY MONONUCLEAR NO ORGANISMS SEEN    Culture   Final    NO GROWTH < 12 HOURS Performed at [facility], [address] [place], [place] [address]    Report Status PENDING  Incomplete  Culture, sputum-assessment     Status: None   Collection Time: 09/14/16  6:32 PM  Result Value Ref Range Status   Specimen Description SPUTUM  Final   Special Requests Immunocompromised  Final   Sputum evaluation THIS SPECIMEN IS ACCEPTABLE FOR SPUTUM CULTURE  Final   Report Status 09/14/2016 FINAL  Final  Culture, respiratory (NON-Expectorated)     Status: None (Preliminary result)   Collection Time: 09/14/16  6:32 PM  Result Value Ref Range Status   Specimen Description SPUTUM  Final   Special Requests Immunocompromised Reflexed from [id number]  Final   Gram Stain   Final    FEW WBC PRESENT,BOTH PMN AND MONONUCLEAR FEW GRAM NEGATIVE RODS FEW GRAM NEGATIVE COCCI IN PAIRS    Culture   Final    TOO YOUNG TO READ Performed at [facility], [place] [address], [place], [place] [address]    Report Status PENDING  Incomplete    Coagulation Studies: No  results for input(s): LABPROT, INR in the last 72 hours.  Urinalysis: No results for input(s): COLORURINE, LABSPEC, PHURINE, GLUCOSEU, HGBUR, BILIRUBINUR, KETONESUR, PROTEINUR, UROBILINOGEN, NITRITE, LEUKOCYTESUR in the last 72 hours.  Invalid input(s): APPERANCEUR    Imaging: Dg Chest 2 View  Result Date: 09/15/2016 CLINICAL DATA:  SOB EXAM: CHEST  2 VIEW COMPARISON:  09/14/2016 FINDINGS: Heart size is upper limits normal. Right lower lobe infiltrate is again noted. Prominent interstitial markings appear stable. IMPRESSION: Persistent right lower lobe infiltrate. Electronically Signed   By: Nolon Nations M.D.   On: 09/15/2016 10:51   Dg Chest 2 View  Result Date: 09/14/2016 CLINICAL DATA:  Dyspnea EXAM: CHEST  2 VIEW COMPARISON:  08/19/2016 chest radiograph. FINDINGS: Stable cardiomediastinal silhouette with mild cardiomegaly and aortic atherosclerosis. No pneumothorax. Small right pleural effusion. Trace  left pleural effusion. Borderline mild pulmonary edema. Patchy right lung base opacity. IMPRESSION: 1. Borderline mild congestive heart failure. 2. Small right and trace left pleural effusions. 3. Patchy right lung base opacity, favor atelectasis, difficult to exclude a component of aspiration or pneumonia. Electronically Signed   By: Ilona Sorrel M.D.   On: 09/14/2016 13:50   Ct Chest Wo Contrast  Result Date: 09/14/2016 CLINICAL DATA:  Increasing dyspnea. EXAM: CT CHEST WITHOUT CONTRAST TECHNIQUE: Multidetector CT imaging of the chest was performed following the standard protocol without IV contrast. COMPARISON:  Chest radiograph 09/14/2016 FINDINGS: Cardiovascular: Mildly enlarged heart size. No pericardial effusion. Heavy calcific atherosclerotic disease of the aorta and coronary arteries. Tortuosity of the aorta. Mediastinum/Nodes: Bilateral mediastinal lymph nodes with borderline thickening measuring up to 1.5 cm short axis. Similarly mildly prominent bilateral hilar lymph nodes.  Lungs/Pleura: Large in size right pleural effusion. Right lower lobe peribronchial airspace consolidation. Minimal left pleural effusion. Minimal interstitial pulmonary edema. Upper Abdomen: Cholelithiasis. Visualized portion of the upper abdominal organs otherwise normal. Musculoskeletal: No chest wall mass or suspicious bone lesions identified. IMPRESSION: Mildly enlarged cardiac silhouette. Calcific atherosclerotic disease of the coronary arteries. Large right pleural effusion. Right lower lobe peribronchial airspace consolidation may represent atelectasis or infectious consolidation. Tiny left pleural effusion. Minimal interstitial pulmonary edema. Mediastinal and hilar lymphadenopathy, which may be reactive or malignant. Aortic Atherosclerosis (ICD10-I70.0). Electronically Signed   By: Fidela Salisbury M.D.   On: 09/14/2016 14:45   Dg Chest Portable 1 View  Result Date: 09/14/2016 CLINICAL DATA:  POST THORACENTESIS. EXAM: PORTABLE CHEST 1 VIEW COMPARISON:  09/14/2016 FINDINGS: The heart is enlarged. There are interstitial changes consistent with pulmonary edema. There has been improvement in the aeration at right lung base since the prior study. No pneumothorax. IMPRESSION: Interstitial pulmonary edema. Improved aeration in the right lower lobe.  No pneumothorax. Electronically Signed   By: Nolon Nations M.D.   On: 09/14/2016 17:27     Medications:   . ceFEPime (MAXIPIME) IV     . amLODipine  5 mg Oral QPM  . aspirin EC  81 mg Oral Daily  . atorvastatin  40 mg Oral Daily  . calcium acetate  1,334 mg Oral TID WC  . carvedilol  6.25 mg Oral BID  . doxazosin  8 mg Oral Daily  . furosemide  20 mg Intravenous Q12H  . heparin subcutaneous  5,000 Units Subcutaneous Q8H  . insulin aspart  0-5 Units Subcutaneous QHS  . insulin aspart  0-9 Units Subcutaneous TID WC  . insulin detemir  40 Units Subcutaneous BID  . isosorbide mononitrate  30 mg Oral Daily  . lisinopril  20 mg Oral Daily  .  mouth rinse  15 mL Mouth Rinse BID  . pantoprazole  40 mg Oral Daily  . spironolactone  25 mg Oral Daily   acetaminophen **OR** acetaminophen, ALPRAZolam, guaiFENesin-dextromethorphan, ipratropium-albuterol, ondansetron **OR** ondansetron (ZOFRAN) IV  Assessment/ Plan:  [name] is a 76 y.o. white male with diabetes mellitus type II insulin dependent, hypertension, coronary artery disease, hyperlipidemia, prostate cancer status post prostatectomy, right total knee, appendectomy.   MWF [facility] [place] [facility] [facility]  1. End Stage Renal Disease MWF: dialysis for tomorrow. Continue MWF schedule  2. Hypertension: blood pressure at goal.  - carvedilol, amlodipine, lisinopril, doxazosin, spironolactone and furosemide  3. Anemia of chronic kidney disease: hemoglobin 9.9 - Mircera as outpatient.   4. Secondary Hyperparathyroidism:   - calcium acetate with meals  5. Pneumonia: with pleural  effusion, not consistent with empyema. Status post right thoracentesis 6/16.  - IV cefepime - supportive care.   6. 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  - continue glucose    LOS: [place], [place] 6/17/20181:13 PM

## 2016-09-15 NOTE — Progress Notes (Signed)
PHARMACY - PHYSICIAN COMMUNICATION CRITICAL VALUE ALERT - BLOOD CULTURE IDENTIFICATION (BCID)  Results for orders placed or performed during the hospital encounter of 09/14/16  Blood Culture ID Panel (Reflexed) (Collected: 09/14/2016  2:58 PM)  Result Value Ref Range   Enterococcus species NOT DETECTED NOT DETECTED   Listeria monocytogenes NOT DETECTED NOT DETECTED   Staphylococcus species NOT DETECTED NOT DETECTED   Staphylococcus aureus NOT DETECTED NOT DETECTED   Streptococcus species NOT DETECTED NOT DETECTED   Streptococcus agalactiae NOT DETECTED NOT DETECTED   Streptococcus pneumoniae NOT DETECTED NOT DETECTED   Streptococcus pyogenes NOT DETECTED NOT DETECTED   Acinetobacter baumannii NOT DETECTED NOT DETECTED   Enterobacteriaceae species NOT DETECTED NOT DETECTED   Enterobacter cloacae complex NOT DETECTED NOT DETECTED   Escherichia coli NOT DETECTED NOT DETECTED   Klebsiella oxytoca NOT DETECTED NOT DETECTED   Klebsiella pneumoniae NOT DETECTED NOT DETECTED   Proteus species NOT DETECTED NOT DETECTED   Serratia marcescens NOT DETECTED NOT DETECTED   Haemophilus influenzae NOT DETECTED NOT DETECTED   Neisseria meningitidis NOT DETECTED NOT DETECTED   Pseudomonas aeruginosa NOT DETECTED NOT DETECTED   Candida albicans NOT DETECTED NOT DETECTED   Candida glabrata NOT DETECTED NOT DETECTED   Candida krusei NOT DETECTED NOT DETECTED   Candida parapsilosis NOT DETECTED NOT DETECTED   Candida tropicalis NOT DETECTED NOT DETECTED   Gram stain: GPR  Name of physician (or Provider) ContactedMarcille Blanco  Changes to prescribed antibiotics required: n/a  Lyndle Pang S 09/15/2016  5:36 AM

## 2016-09-15 NOTE — Progress Notes (Signed)
[place] [facility] [place] NAME: Calvin Good    MR#:  [id number]  DATE OF BIRTH:  03-Mar-1941  SUBJECTIVE:  CHIEF COMPLAINT:   Chief Complaint  Patient presents with  . Shortness of Breath   - Dialysis patient admitted with shortness of breath and noted to have pleural effusion, that was drained yesterday in the emergency room where thoracentesis. -Still continues to feel short of breath today. Requiring supplemental oxygen.  REVIEW OF SYSTEMS:  Review of Systems  Constitutional: Positive for malaise/fatigue. Negative for chills, diaphoresis and fever.  Eyes: Negative for blurred vision and double vision.  Respiratory: Positive for shortness of breath. Negative for cough and wheezing.   Cardiovascular: Negative for chest pain, palpitations and leg swelling.  Gastrointestinal: Negative for abdominal pain, constipation, diarrhea, nausea and vomiting.  Genitourinary: Negative for dysuria.  Musculoskeletal: Negative for myalgias.  Neurological: Negative for dizziness, speech change, focal weakness, seizures and headaches.  Psychiatric/Behavioral: Negative for depression.    DRUG ALLERGIES:   Allergies  Allergen Reactions  . Tetracyclines & Related Other (See Comments)    Reaction: unknown happened a long time ago and pt doesn't remember reaction   . Morphine Other (See Comments)    Reaction: confusion    VITALS:  Blood pressure (!) 156/54, pulse 79, temperature 98.2 F (36.8 C), temperature source Oral, resp. rate 18, height 5\' 9"  (1.753 m), weight 106.6 kg (235 lb), SpO2 90 %.  PHYSICAL EXAMINATION:  Physical Exam  GENERAL:  76 y.o.-year-old patient lying in the bed, Feels dyspneic EYES: Pupils equal, round, reactive to light and accommodation. No scleral icterus. Extraocular muscles intact.  HEENT: Head atraumatic, normocephalic. Oropharynx and nasopharynx clear.  NECK:  Supple, no jugular venous distention. No thyroid  enlargement, no tenderness.  LUNGS: Normal breath sounds bilaterally, no wheezing, rales,rhonchi or crepitation. No use of accessory muscles of respiration. Decreased bibasilar breath sounds significantly CARDIOVASCULAR: S1, S2 normal. No  rubs, or gallops. 2/6 systolic murmur present ABDOMEN: Soft, nontender, nondistended. Bowel sounds present. No organomegaly or mass.  EXTREMITIES: No pedal edema, cyanosis, or clubbing. Left arm AV fistula NEUROLOGIC: Cranial nerves II through XII are intact. Muscle strength 5/5 in all extremities. Sensation intact. Gait not checked.  PSYCHIATRIC: The patient is alert and oriented x 3. Very anxious at this time  SKIN: No obvious rash, lesion, or ulcer.    LABORATORY PANEL:   CBC  Recent Labs Lab 09/15/16 0510  WBC 10.5  HGB 9.9*  HCT 29.0*  PLT 160   ------------------------------------------------------------------------------------------------------------------  Chemistries   Recent Labs Lab 09/15/16 0510  NA 135  K 3.5  CL 99*  CO2 28  GLUCOSE 260*  BUN 39*  CREATININE 4.81*  CALCIUM 8.6*  AST 13*  ALT 13*  ALKPHOS 54  BILITOT 0.9   ------------------------------------------------------------------------------------------------------------------  Cardiac Enzymes  Recent Labs Lab 09/14/16 1249  TROPONINI <0.03   ------------------------------------------------------------------------------------------------------------------  RADIOLOGY:  Dg Chest 2 View  Result Date: 09/15/2016 CLINICAL DATA:  SOB EXAM: CHEST  2 VIEW COMPARISON:  09/14/2016 FINDINGS: Heart size is upper limits normal. Right lower lobe infiltrate is again noted. Prominent interstitial markings appear stable. IMPRESSION: Persistent right lower lobe infiltrate. Electronically Signed   By: Nolon Nations M.D.   On: 09/15/2016 10:51   Dg Chest 2 View  Result Date: 09/14/2016 CLINICAL DATA:  Dyspnea EXAM: CHEST  2 VIEW COMPARISON:  08/19/2016 chest  radiograph. FINDINGS: Stable cardiomediastinal silhouette with mild cardiomegaly and aortic atherosclerosis. No pneumothorax.  Small right pleural effusion. Trace left pleural effusion. Borderline mild pulmonary edema. Patchy right lung base opacity. IMPRESSION: 1. Borderline mild congestive heart failure. 2. Small right and trace left pleural effusions. 3. Patchy right lung base opacity, favor atelectasis, difficult to exclude a component of aspiration or pneumonia. Electronically Signed   By: Ilona Sorrel M.D.   On: 09/14/2016 13:50   Ct Chest Wo Contrast  Result Date: 09/14/2016 CLINICAL DATA:  Increasing dyspnea. EXAM: CT CHEST WITHOUT CONTRAST TECHNIQUE: Multidetector CT imaging of the chest was performed following the standard protocol without IV contrast. COMPARISON:  Chest radiograph 09/14/2016 FINDINGS: Cardiovascular: Mildly enlarged heart size. No pericardial effusion. Heavy calcific atherosclerotic disease of the aorta and coronary arteries. Tortuosity of the aorta. Mediastinum/Nodes: Bilateral mediastinal lymph nodes with borderline thickening measuring up to 1.5 cm short axis. Similarly mildly prominent bilateral hilar lymph nodes. Lungs/Pleura: Large in size right pleural effusion. Right lower lobe peribronchial airspace consolidation. Minimal left pleural effusion. Minimal interstitial pulmonary edema. Upper Abdomen: Cholelithiasis. Visualized portion of the upper abdominal organs otherwise normal. Musculoskeletal: No chest wall mass or suspicious bone lesions identified. IMPRESSION: Mildly enlarged cardiac silhouette. Calcific atherosclerotic disease of the coronary arteries. Large right pleural effusion. Right lower lobe peribronchial airspace consolidation may represent atelectasis or infectious consolidation. Tiny left pleural effusion. Minimal interstitial pulmonary edema. Mediastinal and hilar lymphadenopathy, which may be reactive or malignant. Aortic Atherosclerosis (ICD10-I70.0).  Electronically Signed   By: Fidela Salisbury M.D.   On: 09/14/2016 14:45   Dg Chest Portable 1 View  Result Date: 09/14/2016 CLINICAL DATA:  POST THORACENTESIS. EXAM: PORTABLE CHEST 1 VIEW COMPARISON:  09/14/2016 FINDINGS: The heart is enlarged. There are interstitial changes consistent with pulmonary edema. There has been improvement in the aeration at right lung base since the prior study. No pneumothorax. IMPRESSION: Interstitial pulmonary edema. Improved aeration in the right lower lobe.  No pneumothorax. Electronically Signed   By: Nolon Nations M.D.   On: 09/14/2016 17:27    EKG:   Orders placed or performed during the hospital encounter of 09/14/16  . ED EKG  . 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  #1 acute hypoxic respiratory failure-secondary to pleural effusion and also right lower lobe infiltrate. -MRSA PCR negative last month, discontinue vancomycin at this time. Continue cefepime. -Patient is status post thoracentesis which has only 1% neutrophils, less likely to be empyema. -Likely has lung reexpansion related to stress, encourage to do incentive spirometry. -Continue to be in oxygen as tolerated. Currently on 3 L.  #2 end-stage renal disease on hemodialysis-on Monday, Wednesday and Friday schedule. -Nephrology consulted and for dialysis tomorrow per schedule  #3 CAD-status post cardiac catheterization last month, has significant RCA disease and medical management was recommended at the time. -Continue aspirin, statin and Coreg.  #4 hypertension-continue home medications patient on Norvasc, Coreg, Lasix, Imdur, spironolactone and lisinopril.  #5 diabetes mellitus-on Levemir and sliding scale insulin in  the hospital.  #6 DVT prophylaxis-subcutaneous heparin.     All the records are reviewed and case discussed with Care Management/Social Workerr. Management plans discussed with the patient, family and they are in agreement.  CODE STATUS: Full code  TOTAL TIME TAKING CARE OF THIS PATIENT: 38 minutes.   POSSIBLE D/C IN 1-2 DAYS, DEPENDING ON  CLINICAL CONDITION.   Gladstone Lighter M.D on 09/15/2016 at 1:01 PM  Between 7am to 6pm - Pager - [phone]  After 6pm go to [email or web address] - password [email or web address] [facility]  Office  [phone]  CC: Primary care physician; [name], MD

## 2016-09-15 NOTE — Progress Notes (Signed)
Pnt had an uneventful night. Pnt appeared to be asleep and comfortable overnight. Pnt now awake and sitting on edge of bed with c/o SOB. Pnt does not appear to be any resp. distress. Pnt is able to speak in complete sentences, no nasal flaring noted or grunting. Oxygen 4L [place] and o2 sats 93%. Pnt continues to have productive cough with  clear, thin sputum. Pnt BP slightly elevated this AM SBP=168(MD aware). Pnt states "I just don't fell well".  Pnt concerned of being febrile, no fevers overnight this shift. Bed low, locked and call bell in reach. No other issues or concerns at this time. Will continue to monitor and assess.

## 2016-09-15 NOTE — Progress Notes (Signed)
Pt reports worsening cough which results in SOB causing pt to be anxious and is distress. MD notified and ordered tessalon and tussionex scheduled. Will administer these meds and continue to monitor.

## 2016-09-16 LAB — BASIC METABOLIC PANEL
ANION GAP: 8 (ref 5–15)
BUN: 49 mg/dL — ABNORMAL HIGH (ref 6–20)
CALCIUM: 8.8 mg/dL — AB (ref 8.9–10.3)
CO2: 28 mmol/L (ref 22–32)
Chloride: 100 mmol/L — ABNORMAL LOW (ref 101–111)
Creatinine, Ser: 6.16 mg/dL — ABNORMAL HIGH (ref 0.61–1.24)
GFR, EST AFRICAN AMERICAN: 9 mL/min — AB (ref >60)
GFR, EST NON AFRICAN AMERICAN: 8 mL/min — AB (ref >60)
Glucose, Bld: 98 mg/dL (ref 65–99)
POTASSIUM: 3.5 mmol/L (ref 3.5–5.1)
SODIUM: 136 mmol/L (ref 135–145)

## 2016-09-16 LAB — BLOOD CULTURE ID PANEL (REFLEXED)

## 2016-09-16 LAB — GLUCOSE, CAPILLARY
GLUCOSE-CAPILLARY: 72 mg/dL (ref 65–99)
GLUCOSE-CAPILLARY: 265 mg/dL — AB (ref 65–99)
GLUCOSE-CAPILLARY: 295 mg/dL — AB (ref 65–99)

## 2016-09-16 LAB — CBC
HCT: 27.4 % — ABNORMAL LOW (ref 40.0–52.0)
Hemoglobin: 9.3 g/dL — ABNORMAL LOW (ref 13.0–18.0)
MCH: 29.5 pg (ref 26.0–34.0)
MCHC: 33.7 g/dL (ref 32.0–36.0)
MCV: 87.6 fL (ref 80.0–100.0)
PLATELETS: 139 10*3/uL — AB (ref 150–440)
RBC: 3.13 MIL/uL — AB (ref 4.40–5.90)
RDW: 13.8 % (ref 11.5–14.5)
WBC: 8.6 10*3/uL (ref 3.8–10.6)

## 2016-09-16 LAB — MRSA PCR SCREENING: MRSA by PCR: POSITIVE — AB

## 2016-09-16 MED ORDER — VANCOMYCIN HCL IN DEXTROSE 1-5 GM/200ML-% IV SOLN
1000.0000 mg | INTRAVENOUS | Status: DC
  Administered 2016-09-16 – 2016-09-20 (×3): 1000 mg via INTRAVENOUS
  Filled 2016-09-16 (×7): qty 200

## 2016-09-16 MED ORDER — INSULIN DETEMIR 100 UNIT/ML ~~LOC~~ SOLN
20.0000 [IU] | Freq: Every day | SUBCUTANEOUS | Status: DC
  Administered 2016-09-17: 20 [IU] via SUBCUTANEOUS
  Filled 2016-09-16 (×2): qty 0.2

## 2016-09-16 NOTE — Progress Notes (Signed)
[place] [facility] [place] NAME: Calvin Good    MR#:  [id number]  DATE OF BIRTH:  08-14-1940  SUBJECTIVE:  CHIEF COMPLAINT:   Chief Complaint  Patient presents with  . Shortness of Breath   - Dialysis patient admitted with shortness of breath and noted to have pleural effusion, that was drained  in the emergency room where thoracentesis. -Still continues to feel short of breath today. Requiring supplemental oxygen.  REVIEW OF SYSTEMS:  Review of Systems  Constitutional: Positive for malaise/fatigue. Negative for chills, diaphoresis and fever.  Eyes: Negative for blurred vision and double vision.  Respiratory: Positive for shortness of breath. Negative for cough and wheezing.   Cardiovascular: Negative for chest pain, palpitations and leg swelling.  Gastrointestinal: Negative for abdominal pain, constipation, diarrhea, nausea and vomiting.  Genitourinary: Negative for dysuria.  Musculoskeletal: Negative for myalgias.  Neurological: Negative for dizziness, speech change, focal weakness, seizures and headaches.  Psychiatric/Behavioral: Negative for depression.    DRUG ALLERGIES:   Allergies  Allergen Reactions  . Tetracyclines & Related Other (See Comments)    Reaction: unknown happened a long time ago and pt doesn't remember reaction   . Morphine Other (See Comments)    Reaction: confusion    VITALS:  Blood pressure (!) 142/69, pulse 84, temperature 98 F (36.7 C), temperature source Oral, resp. rate 19, height 5\' 9"  (1.753 m), weight 109 kg (240 lb 4.8 oz), SpO2 90 %.  PHYSICAL EXAMINATION:  Physical Exam  GENERAL:  76 y.o.-year-old patient lying in the bed, Feels dyspneic EYES: Pupils equal, round, reactive to light and accommodation. No scleral icterus. Extraocular muscles intact.  HEENT: Head atraumatic, normocephalic. Oropharynx and nasopharynx clear.  NECK:  Supple, no jugular venous distention. No thyroid enlargement, no  tenderness.  LUNGS: Normal breath sounds bilaterally, no wheezing, rales,rhonchi or crepitation. No use of accessory muscles of respiration. Decreased bibasilar breath sounds significantly CARDIOVASCULAR: S1, S2 normal. No  rubs, or gallops. 2/6 systolic murmur present ABDOMEN: Soft, nontender, nondistended. Bowel sounds present. No organomegaly or mass.  EXTREMITIES: No pedal edema, cyanosis, or clubbing. Left arm AV fistula NEUROLOGIC: Cranial nerves II through XII are intact. Muscle strength 4/5 in all extremities. Sensation intact. Gait not checked.  PSYCHIATRIC: The patient is alert and oriented x 3. Very anxious at this time  SKIN: No obvious rash, lesion, or ulcer.    LABORATORY PANEL:   CBC  Recent Labs Lab 09/16/16 0450  WBC 8.6  HGB 9.3*  HCT 27.4*  PLT 139*   ------------------------------------------------------------------------------------------------------------------  Chemistries   Recent Labs Lab 09/15/16 0510 09/16/16 0450  NA 135 136  K 3.5 3.5  CL 99* 100*  CO2 28 28  GLUCOSE 260* 98  BUN 39* 49*  CREATININE 4.81* 6.16*  CALCIUM 8.6* 8.8*  AST 13*  --   ALT 13*  --   ALKPHOS 54  --   BILITOT 0.9  --    ------------------------------------------------------------------------------------------------------------------  Cardiac Enzymes  Recent Labs Lab 09/14/16 1249  TROPONINI <0.03   ------------------------------------------------------------------------------------------------------------------  RADIOLOGY:  Dg Chest 2 View  Result Date: 09/15/2016 CLINICAL DATA:  SOB EXAM: CHEST  2 VIEW COMPARISON:  09/14/2016 FINDINGS: Heart size is upper limits normal. Right lower lobe infiltrate is again noted. Prominent interstitial markings appear stable. IMPRESSION: Persistent right lower lobe infiltrate. Electronically Signed   By: Nolon Nations M.D.   On: 09/15/2016 10:51    EKG:   Orders placed or performed during the hospital  encounter of  09/14/16  . ED EKG  . 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  #1 acute hypoxic respiratory failure-secondary to pleural effusion and also right lower lobe infiltrate. -MRSA PCR negative last month, positive MRSA this time- cont vanc. Continue cefepime. -Patient is status post thoracentesis which has only 1% neutrophils, less likely to be empyema. -Likely has lung reexpansion related to stress, encourage to do incentive spirometry. -Continue to be in oxygen as tolerated. Currently on 3 L.  #2 end-stage renal disease on hemodialysis-on Monday, Wednesday and Friday schedule. -Nephrology consulted and for dialysis tomorrow per schedule  #3 CAD-status post cardiac catheterization last month, has significant RCA disease and medical management was recommended at the time. -Continue aspirin, statin and Coreg.  #4 hypertension-continue home medications patient on Norvasc, Coreg, Lasix, Imdur, spironolactone and lisinopril.  #5 diabetes mellitus-on Levemir and sliding scale insulin in the hospital.  #6 DVT prophylaxis-subcutaneous heparin.  PT eval to help d/c planning.   All the records are reviewed and case discussed with Care Management/Social Workerr. Management plans discussed with the patient, family and they are in agreement.  CODE STATUS: Full code  TOTAL TIME TAKING CARE OF THIS PATIENT: 35 minutes.   POSSIBLE D/C IN 1-2 DAYS, DEPENDING ON CLINICAL CONDITION.   Vaughan Basta M.D on 09/16/2016 at 5:19 PM  Between 7am to 6pm - Pager - [phone]  After 6pm go to [email or web address] - password [email or web address] [facility]  Office  [phone]  CC: Primary care physician; [name],  MD

## 2016-09-16 NOTE — Progress Notes (Signed)
Pre hd info 

## 2016-09-16 NOTE — Progress Notes (Signed)
Hd start 

## 2016-09-16 NOTE — Care Management (Signed)
Chronic HD patient [facility] [address]  Elvera Bicker HD liaison notified of admission.

## 2016-09-16 NOTE — Progress Notes (Signed)
Start of hd 

## 2016-09-16 NOTE — Progress Notes (Signed)
Pre hd assessment  

## 2016-09-16 NOTE — Progress Notes (Signed)
Post hd vitals 

## 2016-09-16 NOTE — Progress Notes (Signed)
Pharmacy Antibiotic Note  Calvin Good is a 76 y.o. male admitted on 09/14/2016 with pneumonia/HCAP.  Pharmacy has been consulted for Vancomycin and cefepime dosing. Patient received vancomycin 1gm IV and cefepime 2g IV x 1 dose. Patient has ESRD and received HD MWF. Patient also had recent admission on 5/28   6/18: Vancomycin was dcd on 6/17, then pharmacy consulted again for vancomycin on 6/18.   Plan: Will restart  vancomycin 1g IV every HD session (MWF). Random vanc level prior to 3rd HD.   Continue patient on cefepime 1g IV daily, given after HD on HD days.    Height: 5\' 9"  (175.3 cm) Weight: 235 lb (106.6 kg) IBW/kg (Calculated) : 70.7  Temp (24hrs), Avg:98.2 F (36.8 C), Min:97.8 F (36.6 C), Max:98.5 F (36.9 C)   Recent Labs Lab 09/14/16 1249 09/15/16 0510 09/16/16 0450  WBC 8.2 10.5  --   CREATININE 3.98* 4.81* 6.16*    Estimated Creatinine Clearance: 11.9 mL/min (A) (by C-G formula based on SCr of 6.16 mg/dL (H)).    Allergies  Allergen Reactions  . Tetracyclines & Related Other (See Comments)    Reaction: unknown happened a long time ago and pt doesn't remember reaction   . Morphine Other (See Comments)    Reaction: confusion    Antimicrobials this admission: 6/16 vancomycin  >>  6/16 Cefepime >>   Dose adjustments this admission:  Microbiology results: 6/16 BCx: sent 5/8  MRSA PCR: negative   Thank you for allowing pharmacy to be a part of this patient's care.  Pernell Dupre, PharmD, BCPS Clinical Pharmacist 09/16/2016 9:06 AM

## 2016-09-16 NOTE — Progress Notes (Signed)
  End of hd 

## 2016-09-16 NOTE — Progress Notes (Signed)
Post hd assessment 

## 2016-09-17 ENCOUNTER — Inpatient Hospital Stay: Payer: Medicare Other

## 2016-09-17 LAB — BLOOD GAS, ARTERIAL
ACID-BASE EXCESS: 5.2 mmol/L — AB (ref 0.0–2.0)
BICARBONATE: 31.6 mmol/L — AB (ref 20.0–28.0)
FIO2: 0.4
O2 SAT: 86.9 %
PCO2 ART: 56 mmHg — AB (ref 32.0–48.0)
Patient temperature: 37
pH, Arterial: 7.36 (ref 7.350–7.450)
pO2, Arterial: 55 mmHg — ABNORMAL LOW (ref 83.0–108.0)

## 2016-09-17 LAB — GLUCOSE, CAPILLARY
GLUCOSE-CAPILLARY: 254 mg/dL — AB (ref 65–99)
GLUCOSE-CAPILLARY: 207 mg/dL — AB (ref 65–99)
GLUCOSE-CAPILLARY: 246 mg/dL — AB (ref 65–99)
Glucose-Capillary: 206 mg/dL — ABNORMAL HIGH (ref 65–99)

## 2016-09-17 LAB — CULTURE, RESPIRATORY

## 2016-09-17 LAB — CYTOLOGY - NON PAP

## 2016-09-17 LAB — CULTURE, BLOOD (ROUTINE X 2)
SPECIAL REQUESTS: ADEQUATE
Special Requests: ADEQUATE

## 2016-09-17 LAB — CULTURE, RESPIRATORY W GRAM STAIN: Culture: NORMAL

## 2016-09-17 MED ORDER — EPOETIN ALFA 10000 UNIT/ML IJ SOLN
4000.0000 [IU] | INTRAMUSCULAR | Status: DC
  Administered 2016-09-18: 12:00:00 via INTRAVENOUS
  Administered 2016-09-20 – 2016-09-23 (×2): 4000 [IU] via INTRAVENOUS
  Filled 2016-09-17: qty 0.4
  Filled 2016-09-17 (×2): qty 1

## 2016-09-17 MED ORDER — DEXTROSE 5 % IV SOLN
2.0000 g | INTRAVENOUS | Status: DC
  Filled 2016-09-17: qty 2

## 2016-09-17 MED ORDER — INSULIN DETEMIR 100 UNIT/ML ~~LOC~~ SOLN
30.0000 [IU] | Freq: Every day | SUBCUTANEOUS | Status: DC
  Administered 2016-09-17 – 2016-09-22 (×5): 30 [IU] via SUBCUTANEOUS
  Filled 2016-09-17 (×7): qty 0.3

## 2016-09-17 MED ORDER — IPRATROPIUM-ALBUTEROL 0.5-2.5 (3) MG/3ML IN SOLN
3.0000 mL | RESPIRATORY_TRACT | Status: DC | PRN
  Administered 2016-09-18: 3 mL via RESPIRATORY_TRACT
  Filled 2016-09-17: qty 3

## 2016-09-17 MED ORDER — DEXTROSE 5 % IV SOLN
1.0000 g | Freq: Once | INTRAVENOUS | Status: AC
  Administered 2016-09-17: 1 g via INTRAVENOUS
  Filled 2016-09-17: qty 1

## 2016-09-17 NOTE — Progress Notes (Signed)
[facility]  ROUNDING NOTE   Subjective:   [name] admitted to [facility] on 09/14/2016 for Shortness of breath [R06.02] Pleural effusion [J90] HCAP (healthcare-associated pneumonia) [J18.9] Acute on chronic respiratory failure with hypoxia ([place]) [J96.21]  Patient found to have right sided pleural effusion. Thoracentesis done removing 1060 mL pleural fluid removed.    HEMODIALYSIS FLOWSHEET:  Blood Flow Rate (mL/min): 350 mL/min Arterial Pressure (mmHg): -130 mmHg Venous Pressure (mmHg): 140 mmHg Transmembrane Pressure (mmHg): 80 mmHg Ultrafiltration Rate (mL/min): 920 mL/min Dialysate Flow Rate (mL/min): 600 ml/min Conductivity: Machine : 15.4 Conductivity: Machine : 15.4 Dialysis Fluid Bolus: Normal Saline Bolus Amount (mL): 250 mL Dialysate Change:  (3k)    Objective:  Vital signs in last 24 hours:  Temp:  [97.7 F (36.5 C)-98.5 F (36.9 C)] 98.4 F (36.9 C) (06/19 0432) Pulse Rate:  [71-84] 84 (06/19 0432) Resp:  [14-20] 20 (06/19 0432) BP: (114-176)/(48-126) 147/50 (06/19 1024) SpO2:  [90 %-94 %] 90 % (06/19 1031)  Weight change:  Filed Weights   09/14/16 1240 09/16/16 0940  Weight: 106.6 kg (235 lb) 109 kg (240 lb 4.8 oz)    Intake/Output: I/O last 3 completed shifts: In: 170 [P.O.:120; IV Piggyback:50] Out: 2000 [Other:2000]   Intake/Output this shift:  No intake/output data recorded.  Physical Exam: General: NAD,    Head: Normocephalic, atraumatic. Moist oral mucosal membranes  Eyes: Anicteric,    Neck: Supple, trachea midline  Lungs:  Bilateral wheezing  Heart: Regular rate and rhythm  Abdomen:  Soft, nontender,   Extremities: no peripheral edema.  Neurologic: Nonfocal, moving all four extremities  Skin: No lesions  GU: Right femoral hematoma  Access: Left upper arm AVF     Basic Metabolic Panel:  Recent Labs Lab 09/14/16 1249 09/15/16 0510 09/16/16 0450  NA 134* 135 136  K 3.9 3.5 3.5  CL 96* 99* 100*  CO2 28 28  28   GLUCOSE 280* 260* 98  BUN 27* 39* 49*  CREATININE 3.98* 4.81* 6.16*  CALCIUM 8.9 8.6* 8.8*    Liver Function Tests:  Recent Labs Lab 09/15/16 0510  AST 13*  ALT 13*  ALKPHOS 54  BILITOT 0.9  PROT 6.5  ALBUMIN 3.4*   No results for input(s): LIPASE, AMYLASE in the last 168 hours. No results for input(s): AMMONIA in the last 168 hours.  CBC:  Recent Labs Lab 09/14/16 1249 09/15/16 0510 09/16/16 0450  WBC 8.2 10.5 8.6  NEUTROABS 5.8  --   --   HGB 10.3* 9.9* 9.3*  HCT 29.4* 29.0* 27.4*  MCV 86.9 87.0 87.6  PLT 175 160 139*    Cardiac Enzymes:  Recent Labs Lab 09/14/16 1249  TROPONINI <0.03    BNP: Invalid input(s): POCBNP  CBG:  Recent Labs Lab 09/15/16 2121 09/16/16 0737 09/16/16 1636 09/16/16 2203 09/17/16 0731  GLUCAP 218* 72 265* 295* 206*    Microbiology: Results for orders placed or performed during the hospital encounter of 09/14/16  Blood Culture (routine x 2)     Status: None (Preliminary result)   Collection Time: 09/14/16  2:53 PM  Result Value Ref Range Status   Specimen Description BLOOD RIGHT AC  Final   Special Requests   Final    BOTTLES DRAWN AEROBIC AND ANAEROBIC Blood Culture adequate volume   Culture  Setup Time   Final    GRAM POSITIVE COCCI AEROBIC BOTTLE ONLY CRITICAL RESULT CALLED TO, READ BACK BY AND VERIFIED WITH: Sheema Hallaji @ 0800 09/15/16 by [facility]  Culture   Final    GRAM POSITIVE COCCI IDENTIFICATION TO FOLLOW Performed at [facility], [place] [address] [place], [place] [address]    Report Status PENDING  Incomplete  Blood Culture ID Panel (Reflexed)     Status: Abnormal   Collection Time: 09/14/16  2:53 PM  Result Value Ref Range Status   Enterococcus species (A) NOT DETECTED Final    THIS TEST WAS ORDERED IN ERROR AND HAS BEEN CREDITED.    Comment: INCORRECT PANEL SET UP. SPECIMEN AGE LIMIT EXCEEDED WHEN ERROR WAS DISCOVERED. CALLED TO CHRISTINE KATSOUDAS 09/16/16 1100. SGD   Listeria  monocytogenes TEST WILL BE CREDITED (A) NOT DETECTED Final   Staphylococcus species TEST WILL BE CREDITED (A) NOT DETECTED Final   Staphylococcus aureus TEST WILL BE CREDITED (A) NOT DETECTED Final   Streptococcus species TEST WILL BE CREDITED (A) NOT DETECTED Final   Streptococcus agalactiae TEST WILL BE CREDITED (A) NOT DETECTED Final   Streptococcus pneumoniae TEST WILL BE CREDITED (A) NOT DETECTED Final   Streptococcus pyogenes TEST WILL BE CREDITED (A) NOT DETECTED Final   Acinetobacter baumannii TEST WILL BE CREDITED (A) NOT DETECTED Final   Enterobacteriaceae species TEST WILL BE CREDITED (A) NOT DETECTED Final   Enterobacter cloacae complex TEST WILL BE CREDITED (A) NOT DETECTED Final   Escherichia coli TEST WILL BE CREDITED (A) NOT DETECTED Final   Klebsiella oxytoca TEST WILL BE CREDITED (A) NOT DETECTED Final   Klebsiella pneumoniae TEST WILL BE CREDITED (A) NOT DETECTED Final   Proteus species TEST WILL BE CREDITED (A) NOT DETECTED Final   Serratia marcescens TEST WILL BE CREDITED (A) NOT DETECTED Final   Haemophilus influenzae TEST WILL BE CREDITED (A) NOT DETECTED Final   Neisseria meningitidis TEST WILL BE CREDITED (A) NOT DETECTED Final   Pseudomonas aeruginosa TEST WILL BE CREDITED (A) NOT DETECTED Final   Candida albicans TEST WILL BE CREDITED (A) NOT DETECTED Final   Candida glabrata TEST WILL BE CREDITED (A) NOT DETECTED Final   Candida krusei TEST WILL BE CREDITED (A) NOT DETECTED Final   Candida parapsilosis TEST WILL BE CREDITED (A) NOT DETECTED Final   Candida tropicalis TEST WILL BE CREDITED (A) NOT DETECTED Final  Blood Culture (routine x 2)     Status: Abnormal   Collection Time: 09/14/16  2:58 PM  Result Value Ref Range Status   Specimen Description BLOOD BLOOD LEFT HAND  Final   Special Requests   Final    BOTTLES DRAWN AEROBIC AND ANAEROBIC Blood Culture adequate volume   Culture  Setup Time   Final    GRAM POSITIVE RODS AEROBIC BOTTLE ONLY CRITICAL  RESULT CALLED TO, READ BACK BY AND VERIFIED WITH: MATT MCBANE AT 8315 ON 09/15/16 RWW CONFIRMED BY [facility]    Culture (A)  Final    BACILLUS SPECIES Standardized susceptibility testing for this organism is not available. Performed at [facility], [place] [address] [place], [place] [address]    Report Status 09/17/2016 FINAL  Final  Blood Culture ID Panel (Reflexed)     Status: None   Collection Time: 09/14/16  2:58 PM  Result Value Ref Range Status   Enterococcus species NOT DETECTED NOT DETECTED Final   Listeria monocytogenes NOT DETECTED NOT DETECTED Final   Staphylococcus species NOT DETECTED NOT DETECTED Final   Staphylococcus aureus NOT DETECTED NOT DETECTED Final   Streptococcus species NOT DETECTED NOT DETECTED Final   Streptococcus agalactiae NOT DETECTED NOT DETECTED Final  Streptococcus pneumoniae NOT DETECTED NOT DETECTED Final   Streptococcus pyogenes NOT DETECTED NOT DETECTED Final   Acinetobacter baumannii NOT DETECTED NOT DETECTED Final   Enterobacteriaceae species NOT DETECTED NOT DETECTED Final   Enterobacter cloacae complex NOT DETECTED NOT DETECTED Final   Escherichia coli NOT DETECTED NOT DETECTED Final   Klebsiella oxytoca NOT DETECTED NOT DETECTED Final   Klebsiella pneumoniae NOT DETECTED NOT DETECTED Final   Proteus species NOT DETECTED NOT DETECTED Final   Serratia marcescens NOT DETECTED NOT DETECTED Final   Haemophilus influenzae NOT DETECTED NOT DETECTED Final   Neisseria meningitidis NOT DETECTED NOT DETECTED Final   Pseudomonas aeruginosa NOT DETECTED NOT DETECTED Final   Candida albicans NOT DETECTED NOT DETECTED Final   Candida glabrata NOT DETECTED NOT DETECTED Final   Candida krusei NOT DETECTED NOT DETECTED Final   Candida parapsilosis NOT DETECTED NOT DETECTED Final   Candida tropicalis NOT DETECTED NOT DETECTED Final  Body fluid culture (includes gram stain)     Status: None (Preliminary result)   Collection Time: 09/14/16  3:47 PM  Result  Value Ref Range Status   Specimen Description PLEURAL  Final   Special Requests PLEURAL  Final   Gram Stain   Final    RARE WBC PRESENT, PREDOMINANTLY MONONUCLEAR NO ORGANISMS SEEN    Culture   Final    NO GROWTH 1 DAY Performed at [facility], [address], [place], [place] [address]    Report Status PENDING  Incomplete  Culture, sputum-assessment     Status: None   Collection Time: 09/14/16  6:32 PM  Result Value Ref Range Status   Specimen Description SPUTUM  Final   Special Requests Immunocompromised  Final   Sputum evaluation THIS SPECIMEN IS ACCEPTABLE FOR SPUTUM CULTURE  Final   Report Status 09/14/2016 FINAL  Final  Culture, respiratory (NON-Expectorated)     Status: None   Collection Time: 09/14/16  6:32 PM  Result Value Ref Range Status   Specimen Description SPUTUM  Final   Special Requests Immunocompromised Reflexed from [id number]  Final   Gram Stain   Final    FEW WBC PRESENT,BOTH PMN AND MONONUCLEAR FEW GRAM NEGATIVE RODS FEW GRAM NEGATIVE COCCI IN PAIRS    Culture   Final    Consistent with normal respiratory flora. Performed at [facility], [place] [address] [place], [place] [address]    Report Status 09/17/2016 FINAL  Final  MRSA PCR Screening     Status: Abnormal   Collection Time: 09/15/16  8:30 PM  Result Value Ref Range Status   MRSA by PCR POSITIVE (A) NEGATIVE Corrected    Comment: MARSHA HATCH  AT 2155 ON 09/15/16 RWW        The GeneXpert MRSA Assay (FDA approved for NASAL specimens only), is one component of a comprehensive MRSA colonization surveillance program. It is not intended to diagnose MRSA infection nor to guide or monitor treatment for MRSA infections. CORRECTED ON 06/18 AT 0544: PREVIOUSLY REPORTED AS POSITIVE        The GeneXpert MRSA Assay (FDA approved for NASAL specimens only), is one component of a comprehensive MRSA colonization surveillance program. It is not intended to diagnose MRSA infection  nor to guide or  monitor treatment for MRSA infections. RESULT CALLED TO, READ BACK BY AND VERIFIED WITH: MARSHAATCH AT 2155 ON 09/15/16 RWW     Coagulation Studies: No results for input(s): LABPROT, INR in the last 72 hours.  Urinalysis: No results  for input(s): COLORURINE, LABSPEC, [place]un[place], GLUCOSEU, HGBUR, BILIRUBINUR, KETONESUR, PROTEINUR, UROBILINOGEN, NITRITE, LEUKOCYTESUR in the last 72 hours.  Invalid input(s): APPERANCEUR    Imaging: Dg Chest 2 View  Result Date: 09/17/2016 CLINICAL DATA:  Shortness of breath. History of CHF and COPD. Former smoker. EXAM: CHEST  2 VIEW COMPARISON:  PA and lateral chest x-ray of September 15, 2016 FINDINGS: Dense infiltrate in the right lower lobe has improved. There is persistent increased interstitial density within both lungs. The heart is normal in size. The pulmonary vascularity is not engorged. There is calcification in the wall of the aortic arch. The bony thorax exhibits no acute abnormality. IMPRESSION: Improving right lower lobe pneumonia. Persistent increased interstitial markings likely reflect the patient's COPD and smoking history. Thoracic aortic atherosclerosis. Electronically Signed   By: David  Martinique M.D.   On: 09/17/2016 08:23     Medications:   . ceFEPime (MAXIPIME) IV Stopped (09/16/16 1859)  . vancomycin Stopped (09/16/16 1256)   . amLODipine  5 mg Oral QPM  . aspirin EC  81 mg Oral Daily  . atorvastatin  40 mg Oral Daily  . benzonatate  100 mg Oral TID  . calcium acetate  1,334 mg Oral TID WC  . carvedilol  6.25 mg Oral BID  . Chlorhexidine Gluconate Cloth  6 each Topical Q0600  . chlorpheniramine-HYDROcodone  5 mL Oral Q12H  . doxazosin  8 mg Oral Daily  . furosemide  20 mg Intravenous Q12H  . heparin subcutaneous  5,000 Units Subcutaneous Q8H  . insulin aspart  0-5 Units Subcutaneous QHS  . insulin aspart  0-9 Units Subcutaneous TID WC  . insulin detemir  20 Units Subcutaneous QHS  . isosorbide mononitrate  30 mg Oral Daily  .  lisinopril  20 mg Oral Daily  . mouth rinse  15 mL Mouth Rinse BID  . mupirocin ointment  1 application Nasal BID  . pantoprazole  40 mg Oral Daily  . spironolactone  25 mg Oral Daily   acetaminophen **OR** acetaminophen, ALPRAZolam, guaiFENesin-dextromethorphan, ipratropium-albuterol, ondansetron **OR** ondansetron (ZOFRAN) IV  Assessment/ Plan:  Mr. Deandre Brannan is a 76 y.o. white male with diabetes mellitus type II insulin dependent, hypertension, coronary artery disease, hyperlipidemia, prostate cancer status post prostatectomy, right total knee, appendectomy.   MWF [facility] [place] AVF  1. End Stage Renal Disease MWF: Patient seen during dialysis Tolerating well   2.  Anemia of chronic kidney disease: hemoglobin 9.9 - [facility] as outpatient.   3.  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  Continue good control of blood sugars  4. Secondary Hyperparathyroidism:   - calcium acetate with meals  5. Pneumonia: with pleural effusion, not consistent with empyema. Status post right thoracentesis 6/16.  - IV cefepime - supportive care.       LOS: 3 Esteban Kobashigawa 6/19/201810:43 AM  Late entry

## 2016-09-17 NOTE — Consult Note (Signed)
Pharmacy Antibiotic Note  Calvin Good is a 76 y.o. male admitted on 09/14/2016 with pneumonia.  Pharmacy has been consulted for vancomycin and ceftazidime dosing.  Plan: Continue vancomycin 1g q HD (MWF) Ceftazidime 1g once today then 2g q HD (MWF)  Height: 5\' 9"  (175.3 cm) Weight: 240 lb 4.8 oz (109 kg) IBW/kg (Calculated) : 70.7  Temp (24hrs), Avg:98.3 F (36.8 C), Min:98.1 F (36.7 C), Max:98.5 F (36.9 C)   Recent Labs Lab 09/14/16 1249 09/15/16 0510 09/16/16 0450  WBC 8.2 10.5 8.6  CREATININE 3.98* 4.81* 6.16*    Estimated Creatinine Clearance: 12 mL/min (A) (by C-G formula based on SCr of 6.16 mg/dL (H)).    Allergies  Allergen Reactions  . Tetracyclines & Related Other (See Comments)    Reaction: unknown happened a long time ago and pt doesn't remember reaction   . Morphine Other (See Comments)    Reaction: confusion    Antimicrobials this admission: 6/16 vancomycin  >>  6/16 Cefepime >> 6/19 6/19 Ceftaz>>  Dose adjustments this admission:   Microbiology results: 6/16 BCx: bacillus species  6/16 Sputum: normal flora  6/17 MRSA PCR: positive  Thank you for allowing pharmacy to be a part of this patient's care.  Ramond Dial, Pharm.D, BCPS Clinical Pharmacist  09/17/2016 3:31 PM

## 2016-09-17 NOTE — Progress Notes (Addendum)
PHARMACY CONSULT NOTE FOR:  OUTPATIENT  PARENTERAL ANTIBIOTIC THERAPY (OPAT)  Indication: HCAP/bacteremia Regimen: vancomycin 1g, ceftazidime 2g q HD MWF End date: 09/23/2016  IV antibiotic discharge orders are pended. To discharging provider:  please sign these orders via discharge navigator,  Select New Orders & click on the button choice - Manage This Unsigned Work.     Thank you for allowing pharmacy to be a part of this patient's care.  Ramond Dial 09/17/2016, 3:41 PM

## 2016-09-17 NOTE — Progress Notes (Signed)
[facility]  ROUNDING NOTE   Subjective:   [name] admitted to [facility] on 09/14/2016 for Shortness of breath [R06.02] Pleural effusion [J90] HCAP (healthcare-associated pneumonia) [J18.9] Acute on chronic respiratory failure with hypoxia ([place]) [J96.21]  Patient found to have right sided pleural effusion. Thoracentesis done. 1060 mL pleural fluid removed.    Overall feels better but still has cough and wheezing   Objective:  Vital signs in last 24 hours:  Temp:  [98.1 F (36.7 C)-98.5 F (36.9 C)] 98.1 F (36.7 C) (06/19 1214) Pulse Rate:  [78-84] 78 (06/19 1214) Resp:  [18-20] 18 (06/19 1214) BP: (114-176)/(46-74) 167/46 (06/19 1214) SpO2:  [90 %-94 %] 94 % (06/19 1214)  Weight change:  Filed Weights   09/14/16 1240 09/16/16 0940  Weight: 106.6 kg (235 lb) 109 kg (240 lb 4.8 oz)    Intake/Output: I/O last 3 completed shifts: In: 170 [P.O.:120; IV Piggyback:50] Out: 2000 [Other:2000]   Intake/Output this shift:  No intake/output data recorded.  Physical Exam: General: NAD,    Head: Normocephalic, atraumatic. Moist oral mucosal membranes  Eyes: Anicteric,    Neck: Supple, trachea midline  Lungs:  Bilateral wheezing, crackles at bases  Heart: Regular rate and rhythm  Abdomen:  Soft, nontender,   Extremities: no peripheral edema.  Neurologic: Nonfocal, moving all four extremities  Skin: No lesions     Access: Left upper arm AVF     Basic Metabolic Panel:  Recent Labs Lab 09/14/16 1249 09/15/16 0510 09/16/16 0450  NA 134* 135 136  K 3.9 3.5 3.5  CL 96* 99* 100*  CO2 28 28 28   GLUCOSE 280* 260* 98  BUN 27* 39* 49*  CREATININE 3.98* 4.81* 6.16*  CALCIUM 8.9 8.6* 8.8*    Liver Function Tests:  Recent Labs Lab 09/15/16 0510  AST 13*  ALT 13*  ALKPHOS 54  BILITOT 0.9  PROT 6.5  ALBUMIN 3.4*   No results for input(s): LIPASE, AMYLASE in the last 168 hours. No results for input(s): AMMONIA in the last 168  hours.  CBC:  Recent Labs Lab 09/14/16 1249 09/15/16 0510 09/16/16 0450  WBC 8.2 10.5 8.6  NEUTROABS 5.8  --   --   HGB 10.3* 9.9* 9.3*  HCT 29.4* 29.0* 27.4*  MCV 86.9 87.0 87.6  PLT 175 160 139*    Cardiac Enzymes:  Recent Labs Lab 09/14/16 1249  TROPONINI <0.03    BNP: Invalid input(s): POCBNP  CBG:  Recent Labs Lab 09/16/16 1636 09/16/16 2203 09/17/16 0731 09/17/16 1141 09/17/16 1643  GLUCAP 265* 295* 206* 254* 207*    Microbiology: Results for orders placed or performed during the hospital encounter of 09/14/16  Blood Culture (routine x 2)     Status: Abnormal   Collection Time: 09/14/16  2:53 PM  Result Value Ref Range Status   Specimen Description BLOOD RIGHT AC  Final   Special Requests   Final    BOTTLES DRAWN AEROBIC AND ANAEROBIC Blood Culture adequate volume   Culture  Setup Time   Final    GRAM POSITIVE COCCI AEROBIC BOTTLE ONLY CRITICAL RESULT CALLED TO, READ BACK BY AND VERIFIED WITH: Sheema Hallaji @ 0800 09/15/16 by [facility]    Culture (A)  Final    STAPHYLOCOCCUS SPECIES (COAGULASE NEGATIVE) THE SIGNIFICANCE OF ISOLATING THIS ORGANISM FROM A SINGLE SET OF BLOOD CULTURES WHEN MULTIPLE SETS ARE DRAWN IS UNCERTAIN. PLEASE NOTIFY THE MICROBIOLOGY DEPARTMENT WITHIN ONE WEEK IF SPECIATION AND SENSITIVITIES ARE REQUIRED. Performed at [facility] Lab, [place] [address], [place], [place] [address]    Report Status 09/17/2016 FINAL  Final  Blood Culture ID Panel (Reflexed)     Status: Abnormal   Collection Time: 09/14/16  2:53 PM  Result Value Ref Range Status   Enterococcus species (A) NOT DETECTED Final    THIS TEST WAS ORDERED IN ERROR AND HAS BEEN CREDITED.    Comment: INCORRECT PANEL SET UP. SPECIMEN AGE LIMIT EXCEEDED WHEN ERROR WAS DISCOVERED. CALLED TO CHRISTINE KATSOUDAS 09/16/16 1100. SGD   Listeria monocytogenes TEST WILL BE CREDITED (A) NOT DETECTED Final   Staphylococcus species TEST WILL BE CREDITED (A) NOT DETECTED Final    Staphylococcus aureus TEST WILL BE CREDITED (A) NOT DETECTED Final   Streptococcus species TEST WILL BE CREDITED (A) NOT DETECTED Final   Streptococcus agalactiae TEST WILL BE CREDITED (A) NOT DETECTED Final   Streptococcus pneumoniae TEST WILL BE CREDITED (A) NOT DETECTED Final   Streptococcus pyogenes TEST WILL BE CREDITED (A) NOT DETECTED Final   Acinetobacter baumannii TEST WILL BE CREDITED (A) NOT DETECTED Final   Enterobacteriaceae species TEST WILL BE CREDITED (A) NOT DETECTED Final   Enterobacter cloacae complex TEST WILL BE CREDITED (A) NOT DETECTED Final   Escherichia coli TEST WILL BE CREDITED (A) NOT DETECTED Final   Klebsiella oxytoca TEST WILL BE CREDITED (A) NOT DETECTED Final   Klebsiella pneumoniae TEST WILL BE CREDITED (A) NOT DETECTED Final   Proteus species TEST WILL BE CREDITED (A) NOT DETECTED Final   Serratia marcescens TEST WILL BE CREDITED (A) NOT DETECTED Final   Haemophilus influenzae TEST WILL BE CREDITED (A) NOT DETECTED Final   Neisseria meningitidis TEST WILL BE CREDITED (A) NOT DETECTED Final   Pseudomonas aeruginosa TEST WILL BE CREDITED (A) NOT DETECTED Final   Candida albicans TEST WILL BE CREDITED (A) NOT DETECTED Final   Candida glabrata TEST WILL BE CREDITED (A) NOT DETECTED Final   Candida krusei TEST WILL BE CREDITED (A) NOT DETECTED Final   Candida parapsilosis TEST WILL BE CREDITED (A) NOT DETECTED Final   Candida tropicalis TEST WILL BE CREDITED (A) NOT DETECTED Final  Blood Culture (routine x 2)     Status: Abnormal   Collection Time: 09/14/16  2:58 PM  Result Value Ref Range Status   Specimen Description BLOOD BLOOD LEFT HAND  Final   Special Requests   Final    BOTTLES DRAWN AEROBIC AND ANAEROBIC Blood Culture adequate volume   Culture  Setup Time   Final    GRAM POSITIVE RODS AEROBIC BOTTLE ONLY CRITICAL RESULT CALLED TO, READ BACK BY AND VERIFIED WITH: MATT MCBANE AT 3976 ON 09/15/16 RWW CONFIRMED BY [facility]    Culture (A)  Final     BACILLUS SPECIES Standardized susceptibility testing for this organism is not available. Performed at [facility], [place] [address], [place], [place] [address]    Report Status 09/17/2016 FINAL  Final  Blood Culture ID Panel (Reflexed)     Status: None   Collection Time: 09/14/16  2:58 PM  Result Value Ref Range Status   Enterococcus species NOT DETECTED NOT DETECTED Final   Listeria monocytogenes NOT DETECTED NOT DETECTED Final   Staphylococcus species NOT DETECTED NOT DETECTED Final   Staphylococcus aureus NOT DETECTED NOT DETECTED Final   Streptococcus species NOT DETECTED NOT DETECTED Final   Streptococcus agalactiae NOT DETECTED NOT DETECTED Final   Streptococcus pneumoniae NOT DETECTED NOT DETECTED Final   Streptococcus pyogenes NOT DETECTED NOT DETECTED  Final   Acinetobacter baumannii NOT DETECTED NOT DETECTED Final   Enterobacteriaceae species NOT DETECTED NOT DETECTED Final   Enterobacter cloacae complex NOT DETECTED NOT DETECTED Final   Escherichia coli NOT DETECTED NOT DETECTED Final   Klebsiella oxytoca NOT DETECTED NOT DETECTED Final   Klebsiella pneumoniae NOT DETECTED NOT DETECTED Final   Proteus species NOT DETECTED NOT DETECTED Final   Serratia marcescens NOT DETECTED NOT DETECTED Final   Haemophilus influenzae NOT DETECTED NOT DETECTED Final   Neisseria meningitidis NOT DETECTED NOT DETECTED Final   Pseudomonas aeruginosa NOT DETECTED NOT DETECTED Final   Candida albicans NOT DETECTED NOT DETECTED Final   Candida glabrata NOT DETECTED NOT DETECTED Final   Candida krusei NOT DETECTED NOT DETECTED Final   Candida parapsilosis NOT DETECTED NOT DETECTED Final   Candida tropicalis NOT DETECTED NOT DETECTED Final  Body fluid culture (includes gram stain)     Status: None (Preliminary result)   Collection Time: 09/14/16  3:47 PM  Result Value Ref Range Status   Specimen Description PLEURAL  Final   Special Requests PLEURAL  Final   Gram Stain   Final    RARE WBC  PRESENT, PREDOMINANTLY MONONUCLEAR NO ORGANISMS SEEN    Culture   Final    NO GROWTH 3 DAYS Performed at [facility], [address], [place], [place] [address]    Report Status PENDING  Incomplete  Culture, sputum-assessment     Status: None   Collection Time: 09/14/16  6:32 PM  Result Value Ref Range Status   Specimen Description SPUTUM  Final   Special Requests Immunocompromised  Final   Sputum evaluation THIS SPECIMEN IS ACCEPTABLE FOR SPUTUM CULTURE  Final   Report Status 09/14/2016 FINAL  Final  Culture, respiratory (NON-Expectorated)     Status: None   Collection Time: 09/14/16  6:32 PM  Result Value Ref Range Status   Specimen Description SPUTUM  Final   Special Requests Immunocompromised Reflexed from [id number]  Final   Gram Stain   Final    FEW WBC PRESENT,BOTH PMN AND MONONUCLEAR FEW GRAM NEGATIVE RODS FEW GRAM NEGATIVE COCCI IN PAIRS    Culture   Final    Consistent with normal respiratory flora. Performed at [facility], [place] [address], [place], [place] [address]    Report Status 09/17/2016 FINAL  Final  MRSA PCR Screening     Status: Abnormal   Collection Time: 09/15/16  8:30 PM  Result Value Ref Range Status   MRSA by PCR POSITIVE (A) NEGATIVE Corrected    Comment: MARSHA HATCH  AT 2155 ON 09/15/16 RWW        The GeneXpert MRSA Assay (FDA approved for NASAL specimens only), is one component of a comprehensive MRSA colonization surveillance program. It is not intended to diagnose MRSA infection nor to guide or monitor treatment for MRSA infections. CORRECTED ON 06/18 AT 0544: PREVIOUSLY REPORTED AS POSITIVE        The GeneXpert MRSA Assay (FDA approved for NASAL specimens only), is one component of a comprehensive MRSA colonization surveillance program. It is not intended to diagnose MRSA infection  nor to guide or monitor treatment for MRSA infections. RESULT CALLED TO, READ BACK BY AND VERIFIED WITH: MARSHAATCH AT 2155 ON 09/15/16 RWW      Coagulation Studies: No results for input(s): LABPROT, INR in the last 72 hours.  Urinalysis: No results for input(s): COLORURINE, LABSPEC, PHURINE, GLUCOSEU, HGBUR, BILIRUBINUR, KETONESUR, PROTEINUR, UROBILINOGEN, NITRITE, LEUKOCYTESUR in the  last 72 hours.  Invalid input(s): APPERANCEUR    Imaging: Dg Chest 2 View  Result Date: 09/17/2016 CLINICAL DATA:  Shortness of breath. History of CHF and COPD. Former smoker. EXAM: CHEST  2 VIEW COMPARISON:  PA and lateral chest x-ray of September 15, 2016 FINDINGS: Dense infiltrate in the right lower lobe has improved. There is persistent increased interstitial density within both lungs. The heart is normal in size. The pulmonary vascularity is not engorged. There is calcification in the wall of the aortic arch. The bony thorax exhibits no acute abnormality. IMPRESSION: Improving right lower lobe pneumonia. Persistent increased interstitial markings likely reflect the patient's COPD and smoking history. Thoracic aortic atherosclerosis. Electronically Signed   By: David  Martinique M.D.   On: 09/17/2016 08:23     Medications:   . cefTAZidime (FORTAZ)  IV    . [START ON 09/18/2016] cefTAZidime (FORTAZ)  IV    . vancomycin Stopped (09/16/16 1256)   . amLODipine  5 mg Oral QPM  . aspirin EC  81 mg Oral Daily  . atorvastatin  40 mg Oral Daily  . benzonatate  100 mg Oral TID  . calcium acetate  1,334 mg Oral TID WC  . carvedilol  6.25 mg Oral BID  . Chlorhexidine Gluconate Cloth  6 each Topical Q0600  . chlorpheniramine-HYDROcodone  5 mL Oral Q12H  . doxazosin  8 mg Oral Daily  . furosemide  20 mg Intravenous Q12H  . heparin subcutaneous  5,000 Units Subcutaneous Q8H  . insulin aspart  0-5 Units Subcutaneous QHS  . insulin aspart  0-9 Units Subcutaneous TID WC  . insulin detemir  30 Units Subcutaneous QHS  . isosorbide mononitrate  30 mg Oral Daily  . lisinopril  20 mg Oral Daily  . mouth rinse  15 mL Mouth Rinse BID  . mupirocin ointment  1  application Nasal BID  . pantoprazole  40 mg Oral Daily  . spironolactone  25 mg Oral Daily   acetaminophen **OR** acetaminophen, ALPRAZolam, guaiFENesin-dextromethorphan, ipratropium-albuterol, ondansetron **OR** ondansetron (ZOFRAN) IV  Assessment/ Plan:  [name] is a 76 y.o. white male with diabetes mellitus type II insulin dependent, hypertension, coronary artery disease, hyperlipidemia, prostate cancer status post prostatectomy, right total knee, appendectomy.   MWF [facility] [place] [facility]  1. End Stage Renal Disease MWF: Next HD scheduled for tomorrow  2.  Anemia of chronic kidney disease: hemoglobin 9.3 - Mircera as outpatient.  - EPO while inpatient  3.  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  Continue good control of blood sugars  4. Secondary Hyperparathyroidism:   - calcium acetate with meals  5. Pneumonia: with pleural effusion, not consistent with empyema. Status post right thoracentesis 6/16.  - IV Abx as per ID team       LOS: 3 Mishika Flippen 6/19/20185:12 PM  Late entry

## 2016-09-17 NOTE — Evaluation (Signed)
Physical Therapy Evaluation Patient Details Name: Calvin Good MRN: [id number] DOB: 05/19/1940 Today's Date: 09/17/2016   History of Present Illness  Pt is a 76 y/o M who presented with SOB.  Chest x-ray revealed borderline mild CHF, patchy right lung base opacity within the right and left pleural effusions. Patient had a right-sided thoracentesis in the ED.  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    Clinical Impression  Pt admitted with above diagnosis. Pt currently with functional limitations due to the deficits listed below (see PT Problem List). Mr. Hohn was enthusiastic to participate with therapy. Session limited due to low SpO2 readings. SpO2 85-91 seated EOB and in static standing on 2L O2 with cues for pursed lip breathing and upright posture. SpO2 drops to 81% on 2L O2 when ambulating with mild SOB.  Distance limited to 30 ft due to SpO2 reading. Pt's main impairment in endurance and SOB would best be addressed in Pulmonary Rehab.  Pt will benefit from skilled PT to increase their independence and safety with mobility to allow discharge to the venue listed below.      Follow Up Recommendations No PT follow up    Equipment Recommendations  None recommended by PT    Recommendations for Other Services Other (comment) (Pulmonary Rehab)     Precautions / Restrictions Precautions Precautions: Fall Restrictions Weight Bearing Restrictions: No      Mobility  Bed Mobility Overal bed mobility: Modified Independent             General bed mobility comments: No physical assist or cues needed.  Increased time and effort.  Transfers Overall transfer level: Needs assistance Equipment used: None Transfers: Sit to/from Stand Sit to Stand: Min guard         General transfer comment: Min guard for safety.  Pt with LOB x1 when turning to sit but able to steady without outside assist.    Ambulation/Gait Ambulation/Gait assistance: Min  guard Ambulation Distance (Feet): 30 Feet Assistive device: None Gait Pattern/deviations: Step-through pattern;Decreased stride length Gait velocity: decreased Gait velocity interpretation: Below normal speed for age/gender General Gait Details: Slow and steady gait.  SpO2 drops to 81% on 2L O2 with mild SOB.  Distance limited due to SpO2 reading.   Stairs            Wheelchair Mobility    Modified Rankin (Stroke Patients Only)       Balance Overall balance assessment: Needs assistance Sitting-balance support: No upper extremity supported;Feet supported Sitting balance-Leahy Scale: Good     Standing balance support: No upper extremity supported;During functional activity Standing balance-Leahy Scale: Fair Standing balance comment: Pt able to stand statically without physical assist or AD but likely would lose his balance with outside perturbation                             Pertinent Vitals/Pain Pain Assessment: No/denies pain    Home Living Family/patient expects to be discharged to:: Private residence Living Arrangements: Children Available Help at Discharge: Family;Available 24 hours/day Type of Home: House Home Access: Stairs to enter Entrance Stairs-Rails: Chemical engineer of Steps: 4 Home Layout: Two level;Able to live on main level with bedroom/bathroom Home Equipment: Kasandra Knudsen - single point;Shower seat;Grab bars - tub/shower;Grab bars - toilet      Prior Function Level of Independence: Needs assistance   Gait / Transfers Assistance Needed: Pt limited to ambulating household distances due to  fatigue (30-50 ft).  Pt does not use AD.  No falls in the past 6 months.  Uses 2L O2 at baseline.  ADL's / Homemaking Assistance Needed: Pt's daughter does the driving, cooking, cleaning.          Hand Dominance   Dominant Hand: Right    Extremity/Trunk Assessment   Upper Extremity Assessment Upper Extremity Assessment: Generalized  weakness    Lower Extremity Assessment Lower Extremity Assessment: RLE deficits/detail;LLE deficits/detail RLE Deficits / Details: Strength grossly 3+/5 LLE Deficits / Details: Strength grossly 4/5    Cervical / Trunk Assessment Cervical / Trunk Assessment: Kyphotic  Communication   Communication: No difficulties  Cognition Arousal/Alertness: Awake/alert Behavior During Therapy: WFL for tasks assessed/performed Overall Cognitive Status: Within Functional Limits for tasks assessed                                        General Comments General comments (skin integrity, edema, etc.): SpO2 85-91 seated EOB and in static standing on 2L O2 with cues for pursed lip breathing and upright posture.      Exercises     Assessment/Plan    PT Assessment Patient needs continued PT services  PT Problem List Decreased strength;Decreased activity tolerance;Decreased balance;Cardiopulmonary status limiting activity       PT Treatment Interventions DME instruction;Gait training;Stair training;Functional mobility training;Therapeutic activities;Therapeutic exercise;Balance training;Neuromuscular re-education;Patient/family education    PT Goals (Current goals can be found in the Care Plan section)  Acute Rehab PT Goals Patient Stated Goal: to go home and to build up endurance PT Goal Formulation: With patient Time For Goal Achievement: 10/01/16 Potential to Achieve Goals: Good    Frequency Min 2X/week   Barriers to discharge        Co-evaluation               AM-PAC PT "6 Clicks" Daily Activity  Outcome Measure Difficulty turning over in bed (including adjusting bedclothes, sheets and blankets)?: None Difficulty moving from lying on back to sitting on the side of the bed? : A Little Difficulty sitting down on and standing up from a chair with arms (e.g., wheelchair, bedside commode, etc,.)?: A Little Help needed moving to and from a bed to chair (including a  wheelchair)?: A Little Help needed walking in hospital room?: A Little Help needed climbing 3-5 steps with a railing? : A Little 6 Click Score: 19    End of Session Equipment Utilized During Treatment: Gait belt;Oxygen Activity Tolerance: Treatment limited secondary to medical complications (Comment) (hypoxia) Patient left: in chair;with call bell/phone within reach;with chair alarm set Nurse Communication: Mobility status;Other (comment) (SpO2 readings) PT Visit Diagnosis: Muscle weakness (generalized) (M62.81);Difficulty in walking, not elsewhere classified (R26.2)    Time: 0272-5366 PT Time Calculation (min) (ACUTE ONLY): 33 min   Charges:   PT Evaluation $PT Eval Low Complexity: 1 Procedure PT Treatments $Therapeutic Activity: 8-22 mins   PT G Codes:        Collie Siad PT, DPT 09/17/2016, 1:25 PM

## 2016-09-17 NOTE — Care Management (Signed)
Patient admitted for acute hypoxic respiratory failure.  Patient has chronic home O2 through [facility].  Patient open with [facility] for nursing and PT.  Sarah from [facility] aware of admission.   PT has assessed patient and does not recommend any PT follow up. Patient has cane in the home for ambulation. Plan for patient to discharge with outpatient antibiotics administered at HD.  [facility] HD liaison notified.  Patient will require resumption of home health nursing at discharge.  RNCM following.

## 2016-09-17 NOTE — Progress Notes (Signed)
Infectious Disease Long Term IV Antibiotic Orders  Diagnosis: HCAP Bacillus bacteremia  Culture results BCX - Bacillus species  Allergies:  Allergies  Allergen Reactions  . Tetracyclines & Related Other (See Comments)    Reaction: unknown happened a long time ago and pt doesn't remember reaction   . Morphine Other (See Comments)    Reaction: confusion    Discharge antibiotics Vancomycin            1000      mg  MWF at HD Goal vancomycin trough 15-20.    Pharmacy to adjust dosing based on levels Ceftazidime 2 gm after each HD session PICC Care per protocol Labs weekly while on IV antibiotics      CBC w diff   Comprehensive met panel Vancomycin Trough    Planned duration of antibiotics -10 days Stop date 09/23/16 Follow up clinic date No need for ID fu  FAX weekly labs to [phone]  Leonel Ramsay, MD

## 2016-09-17 NOTE — Consult Note (Signed)
[facility] Infectious Disease     Reason for Consult:Bacteremia    Referring Physician: Dolores Frame Date of Admission:  09/14/2016   Active Problems:   HCAP (healthcare-associated pneumonia)   HPI: Calvin Good is a 76 y.o. male with ESRD on HD admitted with SOB and found to have bil pleural effusions and possible infiltrate. He had no fevers on admit and wbc was  8-10. He had bcx + bacillus species. He does report a cough productive of thick sputum but overall feels better after a thoracentesis with 1.5 L removed. Cx of pleural fluid negative   Past Medical History:  Diagnosis Date  . Cancer [facility])    Prostate  . CHF (congestive heart failure) ([place])   . Chronic kidney disease   . Complication of anesthesia    hard to wake up  . COPD (chronic obstructive pulmonary disease) ([place])   . Diabetes mellitus without complication ([place])   . Difficult intubation   . Dyspnea   . GERD (gastroesophageal reflux disease)   . Hyperlipidemia   . Hypertension    Past Surgical History:  Procedure Laterality Date  . APPENDECTOMY    . AV FISTULA PLACEMENT Left 05/30/2016   Procedure: ARTERIOVENOUS (AV) FISTULA CREATION ( BRACHIOCEPHALIC );  Surgeon: [name], MD;  Location: [facility] ORS;  Service: Vascular;  Laterality: Left;  . CAPD INSERTION N/A 05/30/2016   Procedure: LAPAROSCOPIC INSERTION CONTINUOUS AMBULATORY PERITONEAL DIALYSIS  (CAPD) CATHETER;  Surgeon: [name], MD;  Location: [facility] ORS;  Service: Vascular;  Laterality: N/A;  . DIALYSIS/PERMA CATHETER INSERTION    . DIALYSIS/PERMA CATHETER REMOVAL N/A 08/21/2016   Procedure: Dialysis/Perma Catheter Removal;  Surgeon: [name], MD;  Location: [place] [facility];  Service: Cardiovascular;  Laterality: N/A;  . JOINT REPLACEMENT     right knee   . LEFT HEART CATH AND CORONARY ANGIOGRAPHY N/A 08/21/2016   Procedure: Left Heart Cath and Coronary Angiography possible PCI;  Surgeon: [name], MD;  Location: [place] [facility];   Service: Cardiovascular;  Laterality: N/A;  . postate removal     . PROSTATE SURGERY    . 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 REMOVAL OF A DIALYSIS CATHETER N/A 08/08/2016   Procedure: REMOVAL OF A DIALYSIS CATHETER;  Surgeon: [name], MD;  Location: [facility] ORS;  Service: Vascular;  Laterality: N/A;   Social History  Substance Use Topics  . Smoking status: Former Smoker    Quit date: 05/21/1982  . Smokeless tobacco: Never Used  . Alcohol use 1.8 oz/week    3 Glasses of wine per week   Family History  Problem Relation Age of Onset  . Cancer Father   . CAD Neg Hx   . Diabetes Neg Hx     Allergies:  Allergies  Allergen Reactions  . Tetracyclines & Related Other (See Comments)    Reaction: unknown happened a long time ago and pt doesn't remember reaction   . Morphine Other (See Comments)    Reaction: confusion    Current antibiotics: Antibiotics Given (last 72 hours)    Date/Time Action Medication Dose Rate   09/14/16 1528 New Bag/Given   vancomycin (VANCOCIN) IVPB 1000 mg/200 mL premix 1,000 mg 200 mL/hr   09/14/16 1700 New Bag/Given   ceFEPIme (MAXIPIME) 2 g in dextrose 5 % 50 mL IVPB 2 g 100  mL/hr   09/14/16 1855 New Bag/Given   vancomycin (VANCOCIN) IVPB 1000 mg/200 mL premix 1,000 mg 200 mL/hr   09/15/16 1803 New Bag/Given   ceFEPIme (MAXIPIME) 1 g in dextrose 5 % 50 mL IVPB 1 g 100 mL/hr   09/16/16 1156 New Bag/Given   vancomycin (VANCOCIN) IVPB 1000 mg/200 mL premix 1,000 mg 200 mL/hr   09/16/16 1829 New Bag/Given   ceFEPIme (MAXIPIME) 1 g in dextrose 5 % 50 mL IVPB 1 g 100 mL/hr      MEDICATIONS: . amLODipine  5 mg Oral QPM  . aspirin EC  81 mg Oral Daily  . atorvastatin  40 mg Oral Daily  . benzonatate  100 mg Oral TID  . calcium acetate  1,334 mg Oral TID WC  . carvedilol  6.25 mg Oral BID  .  Chlorhexidine Gluconate Cloth  6 each Topical Q0600  . chlorpheniramine-HYDROcodone  5 mL Oral Q12H  . doxazosin  8 mg Oral Daily  . furosemide  20 mg Intravenous Q12H  . heparin subcutaneous  5,000 Units Subcutaneous Q8H  . insulin aspart  0-5 Units Subcutaneous QHS  . insulin aspart  0-9 Units Subcutaneous TID WC  . insulin detemir  30 Units Subcutaneous QHS  . isosorbide mononitrate  30 mg Oral Daily  . lisinopril  20 mg Oral Daily  . mouth rinse  15 mL Mouth Rinse BID  . mupirocin ointment  1 application Nasal BID  . pantoprazole  40 mg Oral Daily  . spironolactone  25 mg Oral Daily    Review of Systems - 11 systems reviewed and negative per HPI   OBJECTIVE: Temp:  [98.1 F (36.7 C)-98.5 F (36.9 C)] 98.1 F (36.7 C) (06/19 1214) Pulse Rate:  [78-84] 78 (06/19 1214) Resp:  [18-20] 18 (06/19 1214) BP: (114-176)/(46-74) 167/46 (06/19 1214) SpO2:  [90 %-94 %] 94 % (06/19 1214) Physical Exam  Constitutional: He is oriented to person, place, and time. Frail appearing HENT: anicteric  Mouth/Throat: Oropharynx is clear and moist. No oropharyngeal exudate.  Cardiovascular: Normal rate, regular rhythm and normal heart sounds.  Pulmonary/Chest: bil rhonchi  Abdominal: Soft. Bowel sounds are normal. He exhibits no distension. There is no tenderness.  Lymphadenopathy: He has no cervical adenopathy.  Ext LUE avf wnl Neurological: He is alert and oriented to person, place, and time.  Skin: Skin is warm and dry. No rash noted. No erythema.  Psychiatric: He has a normal mood and affect. His behavior is normal.     LABS: Results for orders placed or performed during the hospital encounter of 09/14/16 (from the past 48 hour(s))  Glucose, capillary     Status: Abnormal   Collection Time: 09/15/16  5:03 PM  Result Value Ref Range   Glucose-Capillary 145 (H) 65 - 99 mg/dL   Comment 1 Notify RN   MRSA PCR Screening     Status: Abnormal   Collection Time: 09/15/16  8:30 PM  Result  Value Ref Range   MRSA by PCR POSITIVE (A) NEGATIVE    Comment: MARSHA HATCH  AT 2155 ON 09/15/16 RWW        The GeneXpert MRSA Assay (FDA approved for NASAL specimens only), is one component of a comprehensive MRSA colonization surveillance program. It is not intended to diagnose MRSA infection nor to guide or monitor treatment for MRSA infections. CORRECTED ON 06/18 AT 0544: PREVIOUSLY REPORTED AS POSITIVE        The GeneXpert MRSA Assay (FDA approved for NASAL specimens  only), is one component of a comprehensive MRSA colonization surveillance program. It is not intended to diagnose MRSA infection  nor to guide or monitor treatment for MRSA infections. RESULT CALLED TO, READ BACK BY AND VERIFIED WITH: MARSHAATCH AT 2155 ON 09/15/16 RWW   Glucose, capillary     Status: Abnormal   Collection Time: 09/15/16  9:21 PM  Result Value Ref Range   Glucose-Capillary 218 (H) 65 - 99 mg/dL  Basic metabolic panel     Status: Abnormal   Collection Time: 09/16/16  4:50 AM  Result Value Ref Range   Sodium 136 135 - 145 mmol/L   Potassium 3.5 3.5 - 5.1 mmol/L   Chloride 100 (L) 101 - 111 mmol/L   CO2 28 22 - 32 mmol/L   Glucose, Bld 98 65 - 99 mg/dL   BUN 49 (H) 6 - 20 mg/dL   Creatinine, Ser 6.16 (H) 0.61 - 1.24 mg/dL   Calcium 8.8 (L) 8.9 - 10.3 mg/dL   GFR calc non Af Amer 8 (L) >60 mL/min   GFR calc Af Amer 9 (L) >60 mL/min    Comment: (NOTE) The eGFR has been calculated using the CKD EPI equation. This calculation has not been validated in all clinical situations. eGFR's persistently <60 mL/min signify possible Chronic Kidney Disease.    Anion gap 8 5 - 15  CBC     Status: Abnormal   Collection Time: 09/16/16  4:50 AM  Result Value Ref Range   WBC 8.6 3.8 - 10.6 K/uL   RBC 3.13 (L) 4.40 - 5.90 MIL/uL   Hemoglobin 9.3 (L) 13.0 - 18.0 g/dL   HCT 27.4 (L) 40.0 - 52.0 %   MCV 87.6 80.0 - 100.0 fL   MCH 29.5 26.0 - 34.0 pg   MCHC 33.7 32.0 - 36.0 g/dL   RDW 13.8 11.5 - 14.5 %    Platelets 139 (L) 150 - 440 K/uL  Glucose, capillary     Status: None   Collection Time: 09/16/16  7:37 AM  Result Value Ref Range   Glucose-Capillary 72 65 - 99 mg/dL   Comment 1 Notify RN   Glucose, capillary     Status: Abnormal   Collection Time: 09/16/16  4:36 PM  Result Value Ref Range   Glucose-Capillary 265 (H) 65 - 99 mg/dL   Comment 1 Notify RN   Glucose, capillary     Status: Abnormal   Collection Time: 09/16/16 10:03 PM  Result Value Ref Range   Glucose-Capillary 295 (H) 65 - 99 mg/dL   Comment 1 Notify RN   Glucose, capillary     Status: Abnormal   Collection Time: 09/17/16  7:31 AM  Result Value Ref Range   Glucose-Capillary 206 (H) 65 - 99 mg/dL   Comment 1 Notify RN   Glucose, capillary     Status: Abnormal   Collection Time: 09/17/16 11:41 AM  Result Value Ref Range   Glucose-Capillary 254 (H) 65 - 99 mg/dL   Comment 1 Notify RN    No components found for: ESR, C REACTIVE PROTEIN MICRO: Recent Results (from the past 720 hour(s))  Blood Culture (routine x 2)     Status: None (Preliminary result)   Collection Time: 09/14/16  2:53 PM  Result Value Ref Range Status   Specimen Description BLOOD RIGHT [facility]  Final   Special Requests   Final    BOTTLES DRAWN AEROBIC AND ANAEROBIC Blood Culture adequate volume   Culture  Setup Time  Final    GRAM POSITIVE COCCI AEROBIC BOTTLE ONLY CRITICAL RESULT CALLED TO, READ BACK BY AND VERIFIED WITH: Sheema Hallaji @ 0800 09/15/16 by [facility]    Culture   Final    GRAM POSITIVE COCCI IDENTIFICATION TO FOLLOW Performed at [facility], [place] [address] [place], [place] [address]    Report Status PENDING  Incomplete  Blood Culture ID Panel (Reflexed)     Status: Abnormal   Collection Time: 09/14/16  2:53 PM  Result Value Ref Range Status   Enterococcus species (A) NOT DETECTED Final    THIS TEST WAS ORDERED IN ERROR AND HAS BEEN CREDITED.    Comment: INCORRECT PANEL SET UP. SPECIMEN AGE LIMIT EXCEEDED WHEN ERROR WAS  DISCOVERED. CALLED TO CHRISTINE KATSOUDAS 09/16/16 1100. SGD   Listeria monocytogenes TEST WILL BE CREDITED (A) NOT DETECTED Final   Staphylococcus species TEST WILL BE CREDITED (A) NOT DETECTED Final   Staphylococcus aureus TEST WILL BE CREDITED (A) NOT DETECTED Final   Streptococcus species TEST WILL BE CREDITED (A) NOT DETECTED Final   Streptococcus agalactiae TEST WILL BE CREDITED (A) NOT DETECTED Final   Streptococcus pneumoniae TEST WILL BE CREDITED (A) NOT DETECTED Final   Streptococcus pyogenes TEST WILL BE CREDITED (A) NOT DETECTED Final   Acinetobacter baumannii TEST WILL BE CREDITED (A) NOT DETECTED Final   Enterobacteriaceae species TEST WILL BE CREDITED (A) NOT DETECTED Final   Enterobacter cloacae complex TEST WILL BE CREDITED (A) NOT DETECTED Final   Escherichia coli TEST WILL BE CREDITED (A) NOT DETECTED Final   Klebsiella oxytoca TEST WILL BE CREDITED (A) NOT DETECTED Final   Klebsiella pneumoniae TEST WILL BE CREDITED (A) NOT DETECTED Final   Proteus species TEST WILL BE CREDITED (A) NOT DETECTED Final   Serratia marcescens TEST WILL BE CREDITED (A) NOT DETECTED Final   Haemophilus influenzae TEST WILL BE CREDITED (A) NOT DETECTED Final   Neisseria meningitidis TEST WILL BE CREDITED (A) NOT DETECTED Final   Pseudomonas aeruginosa TEST WILL BE CREDITED (A) NOT DETECTED Final   Candida albicans TEST WILL BE CREDITED (A) NOT DETECTED Final   Candida glabrata TEST WILL BE CREDITED (A) NOT DETECTED Final   Candida krusei TEST WILL BE CREDITED (A) NOT DETECTED Final   Candida parapsilosis TEST WILL BE CREDITED (A) NOT DETECTED Final   Candida tropicalis TEST WILL BE CREDITED (A) NOT DETECTED Final  Blood Culture (routine x 2)     Status: Abnormal   Collection Time: 09/14/16  2:58 PM  Result Value Ref Range Status   Specimen Description BLOOD BLOOD LEFT HAND  Final   Special Requests   Final    BOTTLES DRAWN AEROBIC AND ANAEROBIC Blood Culture adequate volume   Culture  Setup  Time   Final    GRAM POSITIVE RODS AEROBIC BOTTLE ONLY CRITICAL RESULT CALLED TO, READ BACK BY AND VERIFIED WITH: MATT MCBANE AT 8101 ON 09/15/16 RWW CONFIRMED BY [facility]    Culture (A)  Final    BACILLUS SPECIES Standardized susceptibility testing for this organism is not available. Performed at [facility], [place] [address], [place], [place] [address]    Report Status 09/17/2016 FINAL  Final  Blood Culture ID Panel (Reflexed)     Status: None   Collection Time: 09/14/16  2:58 PM  Result Value Ref Range Status   Enterococcus species NOT DETECTED NOT DETECTED Final   Listeria monocytogenes NOT DETECTED NOT DETECTED Final   Staphylococcus species NOT DETECTED NOT  DETECTED Final   Staphylococcus aureus NOT DETECTED NOT DETECTED Final   Streptococcus species NOT DETECTED NOT DETECTED Final   Streptococcus agalactiae NOT DETECTED NOT DETECTED Final   Streptococcus pneumoniae NOT DETECTED NOT DETECTED Final   Streptococcus pyogenes NOT DETECTED NOT DETECTED Final   Acinetobacter baumannii NOT DETECTED NOT DETECTED Final   Enterobacteriaceae species NOT DETECTED NOT DETECTED Final   Enterobacter cloacae complex NOT DETECTED NOT DETECTED Final   Escherichia coli NOT DETECTED NOT DETECTED Final   Klebsiella oxytoca NOT DETECTED NOT DETECTED Final   Klebsiella pneumoniae NOT DETECTED NOT DETECTED Final   Proteus species NOT DETECTED NOT DETECTED Final   Serratia marcescens NOT DETECTED NOT DETECTED Final   Haemophilus influenzae NOT DETECTED NOT DETECTED Final   Neisseria meningitidis NOT DETECTED NOT DETECTED Final   Pseudomonas aeruginosa NOT DETECTED NOT DETECTED Final   Candida albicans NOT DETECTED NOT DETECTED Final   Candida glabrata NOT DETECTED NOT DETECTED Final   Candida krusei NOT DETECTED NOT DETECTED Final   Candida parapsilosis NOT DETECTED NOT DETECTED Final   Candida tropicalis NOT DETECTED NOT DETECTED Final  Body fluid culture (includes gram stain)     Status: None  (Preliminary result)   Collection Time: 09/14/16  3:47 PM  Result Value Ref Range Status   Specimen Description PLEURAL  Final   Special Requests PLEURAL  Final   Gram Stain   Final    RARE WBC PRESENT, PREDOMINANTLY MONONUCLEAR NO ORGANISMS SEEN    Culture   Final    NO GROWTH 3 DAYS Performed at [facility], [address] [place], [place] [address]    Report Status PENDING  Incomplete  Culture, sputum-assessment     Status: None   Collection Time: 09/14/16  6:32 PM  Result Value Ref Range Status   Specimen Description SPUTUM  Final   Special Requests Immunocompromised  Final   Sputum evaluation THIS SPECIMEN IS ACCEPTABLE FOR SPUTUM CULTURE  Final   Report Status 09/14/2016 FINAL  Final  Culture, respiratory (NON-Expectorated)     Status: None   Collection Time: 09/14/16  6:32 PM  Result Value Ref Range Status   Specimen Description SPUTUM  Final   Special Requests Immunocompromised Reflexed from [id number]  Final   Gram Stain   Final    FEW WBC PRESENT,BOTH PMN AND MONONUCLEAR FEW GRAM NEGATIVE RODS FEW GRAM NEGATIVE COCCI IN PAIRS    Culture   Final    Consistent with normal respiratory flora. Performed at [facility], [place] [address], [place], [place] [address]    Report Status 09/17/2016 FINAL  Final  MRSA PCR Screening     Status: Abnormal   Collection Time: 09/15/16  8:30 PM  Result Value Ref Range Status   MRSA by PCR POSITIVE (A) NEGATIVE Corrected    Comment: MARSHA HATCH  AT 2155 ON 09/15/16 RWW        The GeneXpert MRSA Assay (FDA approved for NASAL specimens only), is one component of a comprehensive MRSA colonization surveillance program. It is not intended to diagnose MRSA infection nor to guide or monitor treatment for MRSA infections. CORRECTED ON 06/18 AT 0544: PREVIOUSLY REPORTED AS POSITIVE        The GeneXpert MRSA Assay (FDA approved for NASAL specimens only), is one component of a comprehensive MRSA colonization surveillance  program. It is not intended to diagnose MRSA infection  nor to guide or monitor treatment for MRSA infections. RESULT CALLED TO, READ BACK  BY AND VERIFIED WITH: [facility] AT 2155 ON 09/15/16 RWW     IMAGING: Dg Chest 2 View  Result Date: 09/17/2016 CLINICAL DATA:  Shortness of breath. History of CHF and COPD. Former smoker. EXAM: CHEST  2 VIEW COMPARISON:  PA and lateral chest x-ray of September 15, 2016 FINDINGS: Dense infiltrate in the right lower lobe has improved. There is persistent increased interstitial density within both lungs. The heart is normal in size. The pulmonary vascularity is not engorged. There is calcification in the wall of the aortic arch. The bony thorax exhibits no acute abnormality. IMPRESSION: Improving right lower lobe pneumonia. Persistent increased interstitial markings likely reflect the patient's COPD and smoking history. Thoracic aortic atherosclerosis. Electronically Signed   By: Karmen Altamirano  [place] M.D.   On: 09/17/2016 08:23   Dg Chest 2 View  Result Date: 09/15/2016 CLINICAL DATA:  SOB EXAM: CHEST  2 VIEW COMPARISON:  09/14/2016 FINDINGS: Heart size is upper limits normal. Right lower lobe infiltrate is again noted. Prominent interstitial markings appear stable. IMPRESSION: Persistent right lower lobe infiltrate. Electronically Signed   By: Nolon Nations M.D.   On: 09/15/2016 10:51   Dg Chest 2 View  Result Date: 09/14/2016 CLINICAL DATA:  Dyspnea EXAM: CHEST  2 VIEW COMPARISON:  08/19/2016 chest radiograph. FINDINGS: Stable cardiomediastinal silhouette with mild cardiomegaly and aortic atherosclerosis. No pneumothorax. Small right pleural effusion. Trace left pleural effusion. Borderline mild pulmonary edema. Patchy right lung base opacity. IMPRESSION: 1. Borderline mild congestive heart failure. 2. Small right and trace left pleural effusions. 3. Patchy right lung base opacity, favor atelectasis, difficult to exclude a component of aspiration or pneumonia. Electronically  Signed   By: Ilona Sorrel M.D.   On: 09/14/2016 13:50   Ct Chest Wo Contrast  Result Date: 09/14/2016 CLINICAL DATA:  Increasing dyspnea. EXAM: CT CHEST WITHOUT CONTRAST TECHNIQUE: Multidetector CT imaging of the chest was performed following the standard protocol without IV contrast. COMPARISON:  Chest radiograph 09/14/2016 FINDINGS: Cardiovascular: Mildly enlarged heart size. No pericardial effusion. Heavy calcific atherosclerotic disease of the aorta and coronary arteries. Tortuosity of the aorta. Mediastinum/Nodes: Bilateral mediastinal lymph nodes with borderline thickening measuring up to 1.5 cm short axis. Similarly mildly prominent bilateral hilar lymph nodes. Lungs/Pleura: Large in size right pleural effusion. Right lower lobe peribronchial airspace consolidation. Minimal left pleural effusion. Minimal interstitial pulmonary edema. Upper Abdomen: Cholelithiasis. Visualized portion of the upper abdominal organs otherwise normal. Musculoskeletal: No chest wall mass or suspicious bone lesions identified. IMPRESSION: Mildly enlarged cardiac silhouette. Calcific atherosclerotic disease of the coronary arteries. Large right pleural effusion. Right lower lobe peribronchial airspace consolidation may represent atelectasis or infectious consolidation. Tiny left pleural effusion. Minimal interstitial pulmonary edema. Mediastinal and hilar lymphadenopathy, which may be reactive or malignant. Aortic Atherosclerosis (ICD10-I70.0). Electronically Signed   By: Fidela Salisbury M.D.   On: 09/14/2016 14:45   [place] Lower Ext Art Right [employer]  Result Date: 08/28/2016 CLINICAL DATA:  History of pseudo aneurysm, post thrombin injection. EXAM: UNILATERAL RIGHT LOWER EXTREMITY ARTERIAL DUPLEX SCAN TECHNIQUE: Gray-scale sonography as well as color Doppler and duplex ultrasound was performed to evaluate the arteries of the lower extremity. COMPARISON:  Unilateral right lower extremity arterial duplex ultrasound - 08/27/2016;  08/26/2016 FINDINGS: No flow is demonstrated within previously identified right groin pseudoaneurysm. The aneurysm appears to have decreased in size the interval, currently measuring 1.7 x 1.8 x 1.7 cm, previously, 2.5 x 1.6 x 2.4 cm. Previous identified pseudoaneurysm neck is no longer visualized. The adjacent right femoral  vein and artery appear widely patent. Adjacent ill-defined hematoma is also likely minimally decreased in size the interval, currently measuring 7.4 x 3.3 x 2.5 cm, previously, 8.5 x 2.4 x 6.2 cm IMPRESSION: 1. 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 The now thrombosed pseudoaneurysm has also decreased in size, currently measuring 1.8 cm, previously, 2.5 cm. 2. Slight decrease in size of ill-defined right groin hematoma currently measuring 7.4 cm, previously, 8.5 cm. Electronically Signed   By: Sandi Mariscal M.D.   On: 08/28/2016 15:10   [place] Lower Ext [employer]  Result Date: 08/27/2016 CLINICAL DATA:  History of cardiac catheterization. Follow-up right groin pseudoaneurysm. EXAM: UNILATERAL RIGHT LOWER EXTREMITY ARTERIAL DUPLEX SCAN TECHNIQUE: Gray-scale sonography as well as color Doppler and duplex ultrasound was performed to evaluate the arteries of the lower extremity. COMPARISON:  08/26/2016 FINDINGS: Images were obtained in the right groin. There continues to be evidence for a right groin pseudoaneurysm with a narrow neck. Neck measures roughly 0.3 cm in diameter. The pseudoaneurysm measures 2.5 x 1.6 x 2.4 cm and previously measured 2.4 x 1.6 x 1.4 cm. Right femoral arteries in the right groin remain patent. There continues to be a heterogeneous hematoma in the right groin. Hematoma appears larger than the recent comparison examination. The hematoma measures roughly 8.5 x 2.4 x 6.2 cm and previously measured 5.8 x 3.9 x 1.0 cm. IMPRESSION: Right groin pseudoaneurysm has slightly enlarged in size.  There continues to be a neck between a right femoral artery and the pseudo aneurysm. Enlargement of the right groin hematoma. Electronically Signed   By: Markus Daft M.D.   On: 08/27/2016 13:10   [place] Lower Ext Art Right [employer]  Result Date: 08/26/2016 CLINICAL DATA:  Acute onset of right groin bruising blood status post recent cardiac catheterization at the right groin. EXAM: UNILATERAL RIGHT LOWER EXTREMITY ARTERIAL DUPLEX SCAN TECHNIQUE: Gray-scale sonography as well as color Doppler and duplex ultrasound was performed to evaluate the arteries of the lower extremity. COMPARISON:  None. FINDINGS: There appears to be a focal 2.4 x 1.6 x 1.4 cm patent pseudoaneurysm at the right inguinal region, 2 cm below the skin surface, with an associated patent neck to the common femoral artery, demonstrating to and fro flow. There is also a nearby complex elongated fluid collection at the right inguinal region, measuring 5.8 x 3.9 x 1.0 cm, likely reflecting a soft tissue hematoma. IMPRESSION: 1. Focal 2.4 x 1.6 x 1.4 cm patent pseudoaneurysm noted at the right inguinal region, 2 cm below the skin surface, with an associated patent neck to the common femoral artery, demonstrating to and fro flow. 2. Complex elongated fluid collection at the right inguinal region measures 5.8 x 3.9 x 1.0 cm, likely reflecting a soft tissue hematoma. These results were called by telephone at the time of interpretation on 08/26/2016 at 3:52 am to Dr. Rudene Re, who verbally acknowledged these results. Electronically Signed   By: Garald Balding M.D.   On: 08/26/2016 03:53   Dg Chest Portable 1 View  Result Date: 09/14/2016 CLINICAL DATA:  POST THORACENTESIS. EXAM: PORTABLE CHEST 1 VIEW COMPARISON:  09/14/2016 FINDINGS: The heart is enlarged. There are interstitial changes consistent with pulmonary edema. There has been improvement in the aeration at right lung base since the prior study. No pneumothorax. IMPRESSION: Interstitial pulmonary  edema. Improved aeration in the right lower lobe.  No pneumothorax. Electronically Signed   By: Nolon Nations M.D.  On: 09/14/2016 17:27   Dg Chest Portable 1 View  Result Date: 08/19/2016 CLINICAL DATA:  Acute onset of midsternal chest pain. Initial encounter. EXAM: PORTABLE CHEST 1 VIEW COMPARISON:  None. FINDINGS: The lungs are well-aerated. Mild bibasilar opacities may reflect mild interstitial edema or atelectasis. There is no evidence of pleural effusion or pneumothorax. The cardiomediastinal silhouette is borderline enlarged. No acute osseous abnormalities are seen. A right-sided dual-lumen catheter is noted ending about the cavoatrial junction. IMPRESSION: Mild bibasilar airspace opacities may reflect mild interstitial edema or atelectasis. Borderline cardiomegaly. Electronically Signed   By: Garald Balding M.D.   On: 08/19/2016 22:50    Assessment:   Esco Joslyn is a 76 y.o. male with ESRD on HD admitted with SOB and found to have bil pleural effusions and infiltrate. BCX + bacillus x1 and CNS x 1.  Pleural fluid studies do not suggest infection and cx negative. Clinically improving but still with cough with thick sputum.   Recommendations Would rec treatment for HCAP with vanco and ceftazidime. These can be given at HD. Rec a 10 day course Can be given at HD and can follow up with renal as otpt.  Thank you very much for allowing me to participate in the care of this patient. Please call with questions.   Cheral Marker. Ola Spurr, MD

## 2016-09-17 NOTE — Progress Notes (Signed)
[facility] [place] NAME: Calvin Good    MR#:  [id number]  DATE OF BIRTH:  11/15/1940  SUBJECTIVE:  CHIEF COMPLAINT:   Chief Complaint  Patient presents with  . Shortness of Breath   - Dialysis patient admitted with shortness of breath and noted to have pleural effusion, that was drained  in the emergency room where thoracentesis. -Still continues to feel some short of breath today. Much better than he came here. Requiring supplemental oxygen. At baseline- home O2.  REVIEW OF SYSTEMS:  Review of Systems  Constitutional: Positive for malaise/fatigue. Negative for chills, diaphoresis and fever.  Eyes: Negative for blurred vision and double vision.  Respiratory: Positive for shortness of breath. Negative for cough and wheezing.   Cardiovascular: Negative for chest pain, palpitations and leg swelling.  Gastrointestinal: Negative for abdominal pain, constipation, diarrhea, nausea and vomiting.  Genitourinary: Negative for dysuria.  Musculoskeletal: Negative for myalgias.  Neurological: Negative for dizziness, speech change, focal weakness, seizures and headaches.  Psychiatric/Behavioral: Negative for depression.    DRUG ALLERGIES:   Allergies  Allergen Reactions  . Tetracyclines & Related Other (See Comments)    Reaction: unknown happened a long time ago and pt doesn't remember reaction   . Morphine Other (See Comments)    Reaction: confusion    VITALS:  Blood pressure (!) 167/46, pulse 78, temperature 98.1 F (36.7 C), temperature source Oral, resp. rate 18, height 5\' 9"  (1.753 m), weight 109 kg (240 lb 4.8 oz), SpO2 94 %.  PHYSICAL EXAMINATION:  Physical Exam  GENERAL:  76 y.o.-year-old patient lying in the bed, Feels dyspneic EYES: Pupils equal, round, reactive to light and accommodation. No scleral icterus. Extraocular muscles intact.  HEENT: Head atraumatic, normocephalic. Oropharynx and nasopharynx clear.  NECK:   Supple, no jugular venous distention. No thyroid enlargement, no tenderness.  LUNGS: Normal breath sounds bilaterally, no wheezing, rales,rhonchi or crepitation. No use of accessory muscles of respiration. Decreased bibasilar breath sounds  CARDIOVASCULAR: S1, S2 normal. No  rubs, or gallops. 2/6 systolic murmur present ABDOMEN: Soft, nontender, nondistended. Bowel sounds present. No organomegaly or mass.  EXTREMITIES: No pedal edema, cyanosis, or clubbing. Left arm AV fistula NEUROLOGIC: Cranial nerves II through XII are intact. Muscle strength 4/5 in all extremities. Sensation intact. Gait not checked.  PSYCHIATRIC: The patient is alert and oriented x 3. Very anxious at this time  SKIN: No obvious rash, lesion, or ulcer.    LABORATORY PANEL:   CBC  Recent Labs Lab 09/16/16 0450  WBC 8.6  HGB 9.3*  HCT 27.4*  PLT 139*   ------------------------------------------------------------------------------------------------------------------  Chemistries   Recent Labs Lab 09/15/16 0510 09/16/16 0450  NA 135 136  K 3.5 3.5  CL 99* 100*  CO2 28 28  GLUCOSE 260* 98  BUN 39* 49*  CREATININE 4.81* 6.16*  CALCIUM 8.6* 8.8*  AST 13*  --   ALT 13*  --   ALKPHOS 54  --   BILITOT 0.9  --    ------------------------------------------------------------------------------------------------------------------  Cardiac Enzymes  Recent Labs Lab 09/14/16 1249  TROPONINI <0.03   ------------------------------------------------------------------------------------------------------------------  RADIOLOGY:  Dg Chest 2 View  Result Date: 09/17/2016 CLINICAL DATA:  Shortness of breath. History of CHF and COPD. Former smoker. EXAM: CHEST  2 VIEW COMPARISON:  PA and lateral chest x-ray of September 15, 2016 FINDINGS: Dense infiltrate in the right lower lobe has improved. There is persistent increased interstitial density within both lungs. The heart is normal in  size. The pulmonary vascularity is  not engorged. There is calcification in the wall of the aortic arch. The bony thorax exhibits no acute abnormality. IMPRESSION: Improving right lower lobe pneumonia. Persistent increased interstitial markings likely reflect the patient's COPD and smoking history. Thoracic aortic atherosclerosis. Electronically Signed   By: David  Martinique M.D.   On: 09/17/2016 08:23    EKG:   Orders placed or performed during the hospital encounter of 09/14/16  . ED EKG  . 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  #1 acute hypoxic respiratory failure-secondary to pleural effusion and also right lower lobe pneumonia. -MRSA PCR negative last month, positive MRSA this time- cont vanc. Continue cefepime. -Patient is status post thoracentesis which has only 1% neutrophils, less likely to be empyema. -Likely has lung reexpansion related to stress, encourage to do incentive spirometry. -Continue to be in oxygen as tolerated. Currently on 3 L. - have home o2. - have some cough, likely - d/c tomorrow with Abx with HD.  # Bacillus bacteremia- on blood cx.   ID suggest to treat IV vanc + IV ceftazidime, may d.c tomorrow.  #2 end-stage renal disease on hemodialysis-on Monday, Wednesday and Friday schedule. -Nephrology consulted and for dialysis   #3 CAD-status post cardiac catheterization last month, has significant RCA disease and medical management was recommended at the time. -Continue aspirin, statin and Coreg.  #4 hypertension-continue home medications patient on Norvasc, Coreg, Lasix, Imdur, spironolactone and lisinopril.  #5 diabetes mellitus-on Levemir and sliding scale insulin in the hospital.   Increase levemir to 30 U as had  hyperglycemia.  #6 DVT prophylaxis-subcutaneous heparin.  PT eval to help d/c planning.   All the records are reviewed and case discussed with Care Management/Social Workerr. Management plans discussed with the patient, family and they are in agreement.  CODE STATUS: Full code  TOTAL TIME TAKING CARE OF THIS PATIENT: 35 minutes.   POSSIBLE D/C IN 1-2 DAYS, DEPENDING ON CLINICAL CONDITION. Discussed with ID, Nephrologist and  pt's daughter.  Vaughan Basta M.D on 09/17/2016 at 3:25 PM  Between 7am to 6pm - Pager - [phone]  After 6pm go to [email or web address] - password [email or web address] [facility]  Office  [phone]  CC: Primary care physician; [name], MD

## 2016-09-18 ENCOUNTER — Inpatient Hospital Stay: Payer: Medicare Other

## 2016-09-18 DIAGNOSIS — J9622 Acute and chronic respiratory failure with hypercapnia: Secondary | ICD-10-CM

## 2016-09-18 LAB — BODY FLUID CULTURE: Culture: NO GROWTH

## 2016-09-18 LAB — CBC
HCT: 26.5 % — ABNORMAL LOW (ref 40.0–52.0)
Hemoglobin: 8.8 g/dL — ABNORMAL LOW (ref 13.0–18.0)
MCH: 29.6 pg (ref 26.0–34.0)
MCHC: 33.4 g/dL (ref 32.0–36.0)
MCV: 88.8 fL (ref 80.0–100.0)
Platelets: 146 10*3/uL — ABNORMAL LOW (ref 150–440)
RBC: 2.98 MIL/uL — ABNORMAL LOW (ref 4.40–5.90)
RDW: 14.1 % (ref 11.5–14.5)
WBC: 8.2 10*3/uL (ref 3.8–10.6)

## 2016-09-18 LAB — RENAL FUNCTION PANEL
Albumin: 3.3 g/dL — ABNORMAL LOW (ref 3.5–5.0)
Anion gap: 10 (ref 5–15)
BUN: 62 mg/dL — AB (ref 6–20)
CHLORIDE: 94 mmol/L — AB (ref 101–111)
CO2: 26 mmol/L (ref 22–32)
CREATININE: 7.61 mg/dL — AB (ref 0.61–1.24)
Calcium: 8.6 mg/dL — ABNORMAL LOW (ref 8.9–10.3)
GFR calc Af Amer: 7 mL/min — ABNORMAL LOW (ref >60)
GFR calc non Af Amer: 6 mL/min — ABNORMAL LOW (ref >60)
GLUCOSE: 168 mg/dL — AB (ref 65–99)
Phosphorus: 4.8 mg/dL — ABNORMAL HIGH (ref 2.5–4.6)
Potassium: 4.5 mmol/L (ref 3.5–5.1)
Sodium: 130 mmol/L — ABNORMAL LOW (ref 135–145)

## 2016-09-18 LAB — GLUCOSE, CAPILLARY
Glucose-Capillary: 204 mg/dL — ABNORMAL HIGH (ref 65–99)
Glucose-Capillary: 191 mg/dL — ABNORMAL HIGH (ref 65–99)
Glucose-Capillary: 181 mg/dL — ABNORMAL HIGH (ref 65–99)
Glucose-Capillary: 178 mg/dL — ABNORMAL HIGH (ref 65–99)

## 2016-09-18 MED ORDER — DEXTROSE 5 % IV SOLN
2.0000 g | INTRAVENOUS | Status: DC
  Filled 2016-09-18: qty 2

## 2016-09-18 MED ORDER — CEFTAZIDIME AND DEXTROSE 2 GM/50ML IV SOLR
2.0000 g | INTRAVENOUS | Status: DC
  Administered 2016-09-18: 2 g via INTRAVENOUS
  Filled 2016-09-18 (×2): qty 50

## 2016-09-18 MED ORDER — METRONIDAZOLE 500 MG PO TABS
500.0000 mg | ORAL_TABLET | Freq: Two times a day (BID) | ORAL | Status: DC
  Administered 2016-09-18 – 2016-09-20 (×5): 500 mg via ORAL
  Filled 2016-09-18 (×6): qty 1

## 2016-09-18 MED ORDER — LABETALOL HCL 5 MG/ML IV SOLN
5.0000 mg | INTRAVENOUS | Status: DC | PRN
  Administered 2016-09-18: 10 mg via INTRAVENOUS
  Administered 2016-09-18: 5 mg via INTRAVENOUS
  Filled 2016-09-18 (×3): qty 4

## 2016-09-18 NOTE — Progress Notes (Signed)
Inpatient Diabetes Program Recommendations  AACE/ADA: New Consensus Statement on Inpatient Glycemic Control (2015)  Target Ranges:  Prepandial:   less than 140 mg/dL      Peak postprandial:   less than 180 mg/dL (1-2 hours)      Critically ill patients:  140 - 180 mg/dL   Lab Results  Component Value Date   GLUCAP 204 (H) 09/18/2016    Review of Glycemic ControlResults for DERONTE, SOLIS (MRN [id number]) as of 09/18/2016 09:30  Ref. Range 09/17/2016 11:41 09/17/2016 16:43 09/17/2016 21:10 09/18/2016 07:33  Glucose-Capillary Latest Ref Range: 65 - 99 mg/dL 254 (H) 207 (H) 246 (H) 204 (H)    Diabetes history: Type 2 diabetes Outpatient Diabetes medications: NPH 65 units bid, Humalog 10 units tid with meals Current orders for Inpatient glycemic control:  Novolog sensitive tid with meals and HS Levemir 30 units daily Inpatient Diabetes Program Recommendations:    Please consider increasing Levemir to 30 units bid.    Thanks, Adah Perl, RN, BC-ADM Inpatient Diabetes Coordinator Pager [phone] (8a-5p)

## 2016-09-18 NOTE — Consult Note (Signed)
Pharmacy Antibiotic Note  Calvin Good is a 76 y.o. male admitted on 09/14/2016 with pneumonia.  Pharmacy has been consulted for vancomycin and ceftazidime dosing.  Plan: Continue vancomycin 1g q HD (MWF) Random vancomycin level scheduled with the third dialysis session (6/22) goal pre-HD vancomycin level= 15-25 mcg/ml Continue ceftazidime  2g q HD (MWF)  Height: 5\' 9"  (175.3 cm) Weight: 231 lb 7.7 oz (105 kg) IBW/kg (Calculated) : 70.7  Temp (24hrs), Avg:99.2 F (37.3 C), Min:98.1 F (36.7 C), Max:100 F (37.8 C)   Recent Labs Lab 09/14/16 1249 09/15/16 0510 09/16/16 0450  WBC 8.2 10.5 8.6  CREATININE 3.98* 4.81* 6.16*    Estimated Creatinine Clearance: 11.8 mL/min (A) (by C-G formula based on SCr of 6.16 mg/dL (H)).    Allergies  Allergen Reactions  . Tetracyclines & Related Other (See Comments)    Reaction: unknown happened a long time ago and pt doesn't remember reaction   . Morphine Other (See Comments)    Reaction: confusion    Antimicrobials this admission: 6/16 vancomycin  >>  6/16 Cefepime >> 6/19 6/19 Ceftaz>>  Dose adjustments this admission:   Microbiology results: 6/16 BCx: bacillus species/CNS/NGTD  6/16 Sputum: normal flora  6/17 MRSA PCR: positive  Thank you for allowing pharmacy to be a part of this patient's care.  Ulice Dash, PharmD Clinical Pharmacist  09/18/2016 12:08 PM

## 2016-09-18 NOTE — Progress Notes (Signed)
Pre hd assessment  

## 2016-09-18 NOTE — Progress Notes (Signed)
Post hd assessment, primary rn present

## 2016-09-18 NOTE — Consult Note (Signed)
Name: Calvin Good MRN: [id number] DOB: [birth year]    ADMISSION DATE:  09/14/2016 CONSULTATION DATE: 09/18/2016  REFERRING MD : Dr. Darvin Neighbours   CHIEF COMPLAINT: Shortness of Breath   BRIEF PATIENT DESCRIPTION:  76 yo male admitted 06/16 with acute on chronic hypoxic hypercapnic respiratory failure secondary to HCAP, mild CHF exacerbation, and bilateral para pneumonic pleural effusions s/p right sided thoracentesis. Transferred to [facility] 06/20 for somnolence secondary to hypercapnia requiring Bipap.   SIGNIFICANT EVENTS  06/16-Pt admitted to [facility] 06/20-Pt transferred to [facility] Unit  STUDIES:  CT Chest 06/16>>Mildly enlarged cardiac silhouette. Calcific atherosclerotic disease of the coronary arteries. Large right pleural effusion. Right lower lobe peribronchial airspace consolidation may represent atelectasis or infectious consolidation. Tiny left pleural effusion. Minimal interstitial pulmonary edema. Mediastinal and hilar lymphadenopathy, which may be reactive or malignant. Aortic Atherosclerosis.  HISTORY OF PRESENT ILLNESS:   This is a 76 yo male with a PMH of HTN, Hyperlipidemia, GERD, Dyspnea, Difficult Intubation, Diabetes Mellitus, COPD, Chronic Home O2 @ 2L via nasal canula, CKD on Hemodialysis M-W-F, CHF, and Prostate Cancer.  He presented to [facility] ER 06/16 with c/o shortness of breath worse with exertion and worsening orthopnea onset of symtoms several days prior to presentation to the ER.  Per ER notes he has had multiple hospitalizations recently due to respiratory failure.  Prior to presentation to the ER he received hemodialysis 06/15 and post dialysis was euvolemic.  He also has a hx of swelling and pain from a pseudoaneurysm and hematoma recently that has since resolved. Due to respiratory symptoms he went to an Urgent Care and was immediately sent to the ER.  In the ER CT Chest and CXR revealed a large right sided pleural effusion.  Therefore, in the ER a right  sided thoracentesis was performed and fluid was sent for culture and studies.  He was subsequently admitted to the [facility] unit by the hospitalist team for further workup and treatment.  He was transferred to the [facility] Unit 06/20 due to acute encephalopathy secondary to hypercapnia requiring Bipap PCCM consulted.  PAST MEDICAL HISTORY :   has a past medical history of Cancer [facility]); CHF (congestive heart failure) ([place]); Chronic kidney disease; Complication of anesthesia; COPD (chronic obstructive pulmonary disease) ([place]); Diabetes mellitus without complication ([place]); Difficult intubation; Dyspnea; GERD (gastroesophageal reflux disease); Hyperlipidemia; and Hypertension.  has a past surgical history that includes postate removal ; Appendectomy; Prostate surgery; Joint replacement; Dialysis/Perma Catheter Insertion; AV fistula placement (Left, 05/30/2016); CAPD insertion (N/A, 05/30/2016); Removal of a dialysis catheter (N/A, 08/08/2016); Dialysis/Perma Catheter Removal (N/A, 08/21/2016); Left Heart Cath and Coronary Angiography (N/A, 08/21/2016); and Pseudoanerysm Compression (Right, 08/27/2016). Prior to Admission medications   Medication Sig Start Date End Date Taking? Authorizing Provider  amLODipine (NORVASC) 5 MG tablet Take 5 mg by mouth every evening.    Yes [provider]  aspirin 81 MG EC tablet Chew 81 mg by mouth daily.    Yes [provider]  atorvastatin (LIPITOR) 20 MG tablet Take 2 tablets (40 mg total) by mouth daily. Patient taking differently: Take 20 mg by mouth daily.  08/22/16  Yes Dustin Flock, MD  calcium acetate (PHOSLO) 667 MG capsule Take 1,334 mg by mouth 3 (three) times daily with meals.    Yes [provider]  carvedilol (COREG) 6.25 MG tablet Take 6.25 mg by mouth 2 (two) times daily. 09/05/16  Yes [provider]  doxazosin (CARDURA) 8 MG tablet Take 8 mg by mouth daily.  Yes [provider]  furosemide (LASIX) 40 MG tablet Take 40  mg by mouth daily.    Yes [provider]  insulin lispro (HUMALOG) 100 UNIT/ML injection Inject 10 Units into the skin 3 (three) times daily before meals.    Yes [provider]  insulin NPH Human (HUMULIN N,NOVOLIN N) 100 UNIT/ML injection Inject 65 Units into the skin 2 (two) times daily before a meal.    Yes [provider]  isosorbide mononitrate (IMDUR) 30 MG 24 hr tablet Take 1 tablet (30 mg total) by mouth daily. 08/22/16  Yes Dustin Flock, MD  lidocaine-prilocaine (EMLA) cream Apply 1 application topically as needed.   Yes [provider]  lisinopril (PRINIVIL,ZESTRIL) 20 MG tablet Take 20 mg by mouth daily.    Yes [provider]  omeprazole (PRILOSEC) 20 MG capsule Take 20 mg by mouth 2 (two) times daily before a meal.   Yes [provider]  spironolactone (ALDACTONE) 25 MG tablet Take 25 mg by mouth daily.   Yes [provider]   Allergies  Allergen Reactions  . Tetracyclines & Related Other (See Comments)    Reaction: unknown happened a long time ago and pt doesn't remember reaction   . Morphine Other (See Comments)    Reaction: confusion    FAMILY HISTORY:  family history includes Cancer in his father. SOCIAL HISTORY:  reports that he quit smoking about 34 years ago. He has never used smokeless tobacco. He reports that he drinks about 1.8 oz of alcohol per week . He reports that he does not use drugs.  REVIEW OF SYSTEMS: Positives in BOLD  Constitutional: Negative for fever, chills, weight loss, malaise/fatigue and diaphoresis.  HENT: Negative for hearing loss, ear pain, nosebleeds, congestion, sore throat, neck pain, tinnitus and ear discharge.   Eyes: Negative for blurred vision, double vision, photophobia, pain, discharge and redness.  Respiratory: Negative for cough, hemoptysis, sputum production, shortness of breath, wheezing and stridor.   Cardiovascular: Negative for chest pain, palpitations, orthopnea,  claudication, leg swelling and PND.  Gastrointestinal: Negative for heartburn, nausea, vomiting, abdominal pain, diarrhea, constipation, blood in stool and melena.  Genitourinary: Negative for dysuria, urgency, frequency, hematuria and flank pain.  Musculoskeletal: Negative for myalgias, back pain, joint pain and falls.  Skin: Negative for itching and rash.  Neurological: Negative for dizziness, tingling, tremors, sensory change, speech change, focal weakness, seizures, loss of consciousness, weakness and headaches.  Endo/Heme/Allergies: Negative for environmental allergies and polydipsia. Does not bruise/bleed easily.  SUBJECTIVE:  No complaints at this time pt resting comfortably off Bipap.  VITAL SIGNS: Temp:  [98.1 F (36.7 C)-100 F (37.8 C)] 100 F (37.8 C) (06/20 1100) Pulse Rate:  [78-130] 86 (06/20 1100) Resp:  [11-28] 11 (06/20 1100) BP: (150-182)/(42-71) 150/71 (06/20 1100) SpO2:  [76 %-100 %] 100 % (06/20 1100) Weight:  [105 kg (231 lb 7.7 oz)] 105 kg (231 lb 7.7 oz) (06/20 1050)  PHYSICAL EXAMINATION: General: well developed, well nourished, NAD  Neuro: alert and oriented, follows commands HEENT: supple, no JVD Cardiovascular: nsr, s1s2, no M/R/G Lungs: diminished throughout, even, non labored  Abdomen: +BS x4, soft, non tender, non distended  Musculoskeletal: normal bulk and tone, trace bilateral lower extremity edema Skin: intact no lesions    Recent Labs Lab 09/14/16 1249 09/15/16 0510 09/16/16 0450  NA 134* 135 136  K 3.9 3.5 3.5  CL 96* 99* 100*  CO2 28 28 28   BUN 27* 39* 49*  CREATININE 3.98* 4.81*  6.16*  GLUCOSE 280* 260* 98    Recent Labs Lab 09/14/16 1249 09/15/16 0510 09/16/16 0450  HGB 10.3* 9.9* 9.3*  HCT 29.4* 29.0* 27.4*  WBC 8.2 10.5 8.6  PLT 175 160 139*   Dg Chest 2 View  Result Date: 09/17/2016 CLINICAL DATA:  Shortness of breath. History of CHF and COPD. Former smoker. EXAM: CHEST  2 VIEW COMPARISON:  PA and lateral chest  x-ray of September 15, 2016 FINDINGS: Dense infiltrate in the right lower lobe has improved. There is persistent increased interstitial density within both lungs. The heart is normal in size. The pulmonary vascularity is not engorged. There is calcification in the wall of the aortic arch. The bony thorax exhibits no acute abnormality. IMPRESSION: Improving right lower lobe pneumonia. Persistent increased interstitial markings likely reflect the patient's COPD and smoking history. Thoracic aortic atherosclerosis. Electronically Signed   By: Jasmene Goswami  Martinique M.D.   On: 09/17/2016 08:23   Dg Chest Port 1 View  Result Date: 09/18/2016 CLINICAL DATA:  Pt's oxygen decreased. Pt is SOB. Hx of CHF, COPD, Diabetes and HTN. Hx of dialysis catheter. EXAM: PORTABLE CHEST - 1 VIEW COMPARISON:  09/17/2016 FINDINGS: Improvement in the patchy right lower lung airspace disease. Interstitial opacities in both lung bases are stable. No new infiltrate or overt edema. Heart size appears mildly enlarged for technique. Atheromatous aorta. No effusion.  No pneumothorax. Visualized bones unremarkable. IMPRESSION: Improving right lower lobe infiltrate. Aortic Atherosclerosis (ICD10-170.0) Electronically Signed   By: Lucrezia Europe M.D.   On: 09/18/2016 08:21    ASSESSMENT / PLAN: Acute on chronic hypoxic hypercapnic respiratory failure secondary to CHF exacerbation and HCAP with bilateral pleural effusions worse on the right  BCX + for Bacillus Species and MRSA PCR + 06/17  Acute encephalopathy secondary to hypercapnia may be secondary to sedating medication-resolved S/P right sided thoracentesis secondary to large right sided pleural effusion (thoracentesis 06/16 drained 1.5L pleural fluid does not suggest infection) ESRF: Hemodialysis M-W-F Hx: HTN, COPD, and Diabetes Mellitus P: Prn Bipap for dyspnea or AMS secondary to hypercapnia Maintain O2 sats 88% to 92% Continue prn bronchodilator therapy  Intermittent CXR and ABG's Pulmonary  hygiene  Will discontinue tussionex Trend WBC and monitor fever curve  Follow cultures  ID consulted appreciate input recommendations are to treat HCAP with vancomycin and ceftazidime x 10 day course  Nephrology consulted appreciate input hemodialysis per nephrology recommendations  Trend BMP Replace electrolytes as indicated  CBG's ac/hs and SSI  Subq heparin for VTE prophylaxis  Monitor for s/sx of bleeding   -Will keep in Stepdown Unit overnight and reassess in the am 06/21 for potential transfer out of Stepdown Unit.   Marda Stalker, [place] Pager [phone] (please enter 7 digits) PCCM Consult Pager [phone] (please enter 7 digits)    PCCM ATTENDING ATTESTATION:  I have evaluated patient with the APP [place], reviewed database in its entirety and discussed care plan in detail. In addition, this patient was discussed on multidisciplinary rounds.   Cognition now intact No respiratory distress on [place] O2 Chest clear  CXR: minimal RLL infiltrate. No re-accumulation of R effusion evident  Plan as above  Avoid all sedating medications including opiate cough suppressants   Merton Border, MD PCCM service Mobile [phone] Pager [phone] 09/18/2016 2:40 PM

## 2016-09-18 NOTE — Progress Notes (Signed)
  End of hd 

## 2016-09-18 NOTE — Progress Notes (Signed)
   Pre hd vitals 

## 2016-09-18 NOTE — Progress Notes (Signed)
Post hd vitals 

## 2016-09-18 NOTE — Progress Notes (Signed)
When RN approach pt.'s room pt was very lethargic, would only respond when RN would sternum rub. Pt.'s responses were delayed, alert to self. Pt was also having a lot of jerking movements in his arms and legs and stated to RN that "it was hard to breath and felt really bad." RN immediately took vital signs, BP 182/66, pulse 130, respirations 25, O2- 76 on 4 L of oxygen. RN titrated O2 to 6L to receive a reading of 93%. Prime doctor was notified of new findings. Prime doctor arrived to pt.'s room, respiratory was notified. New orders placed by prime doctor. Daughter was called and notified that pt is being transferred to ICU bed 4. Pt was transported with respiratory, NT, and RN. Report given Romie Minus, RN.   Raahil Ong [employer]

## 2016-09-18 NOTE — Progress Notes (Signed)
[facility] [facility] INFECTIOUS DISEASE PROGRESS NOTE Date of Admission:  09/14/2016     ID: Calvin Good is a 76 y.o. male with HCAP Active Problems:   HCAP (healthcare-associated pneumonia)   Subjective: Transferred to ICU for increasing confusion and resp distress. Tolerated HD. Still with cough. Still confused  ROS  Unable to obtain   Medications:  Antibiotics Given (last 72 hours)    Date/Time Action Medication Dose Rate   09/15/16 1803 New Bag/Given   ceFEPIme (MAXIPIME) 1 g in dextrose 5 % 50 mL IVPB 1 g 100 mL/hr   09/16/16 1156 New Bag/Given   vancomycin (VANCOCIN) IVPB 1000 mg/200 mL premix 1,000 mg 200 mL/hr   09/16/16 1829 New Bag/Given   ceFEPIme (MAXIPIME) 1 g in dextrose 5 % 50 mL IVPB 1 g 100 mL/hr   09/17/16 1759 New Bag/Given   cefTAZidime (FORTAZ) 1 g in dextrose 5 % 50 mL IVPB 1 g 100 mL/hr   09/18/16 1319 New Bag/Given   vancomycin (VANCOCIN) IVPB 1000 mg/200 mL premix 1,000 mg 200 mL/hr     . amLODipine  5 mg Oral QPM  . aspirin EC  81 mg Oral Daily  . atorvastatin  40 mg Oral Daily  . benzonatate  100 mg Oral TID  . calcium acetate  1,334 mg Oral TID WC  . carvedilol  6.25 mg Oral BID  . Chlorhexidine Gluconate Cloth  6 each Topical Q0600  . doxazosin  8 mg Oral Daily  . epoetin (EPOGEN/PROCRIT) injection  4,000 Units Intravenous Q M,W,F-HD  . heparin subcutaneous  5,000 Units Subcutaneous Q8H  . insulin aspart  0-5 Units Subcutaneous QHS  . insulin aspart  0-9 Units Subcutaneous TID WC  . insulin detemir  30 Units Subcutaneous QHS  . isosorbide mononitrate  30 mg Oral Daily  . lisinopril  20 mg Oral Daily  . mouth rinse  15 mL Mouth Rinse BID  . mupirocin ointment  1 application Nasal BID  . pantoprazole  40 mg Oral Daily    Objective: Vital signs in last 24 hours: Temp:  [98.2 F (36.8 C)-100.3 F (37.9 C)] 98.7 F (37.1 C) (06/20 1400) Pulse Rate:  [84-130] 108 (06/20 1410) Resp:  [10-28] 14 (06/20 1410) BP: (139-182)/(42-137) 162/52 (06/20  1410) SpO2:  [76 %-100 %] 100 % (06/20 1410) Weight:  [105 kg (231 lb 7.7 oz)] 105 kg (231 lb 7.7 oz) (06/20 1050) Constitutional: He is confused. Frail appearing HENT: anicteric  Mouth/Throat: Oropharynx is clear and moist. No oropharyngeal exudate.  Cardiovascular: Normal rate, regular rhythm and normal heart sounds.  Pulmonary/Chest: bil rhonchi  Abdominal: Soft. Bowel sounds are normal. He exhibits no distension.   Lymphadenopathy: He has no cervical adenopathy.  Ext LUE avf wnl Neurological: He is alert and oriented to person, place, and time.  Skin: Skin is warm and dry. No rash noted. No erythema.  Psychiatric: He has a normal mood and affect. His behavior is normal.   Lab Results  Recent Labs  09/16/16 0450 09/18/16 1200  WBC 8.6 8.2  HGB 9.3* 8.8*  HCT 27.4* 26.5*  NA 136 130*  K 3.5 4.5  CL 100* 94*  CO2 28 26  BUN 49* 62*  CREATININE 6.16* 7.61*    Microbiology: Results for orders placed or performed during the hospital encounter of 09/14/16  Blood Culture (routine x 2)     Status: Abnormal   Collection Time: 09/14/16  2:53 PM  Result Value Ref Range Status   Specimen Description  BLOOD RIGHT AC  Final   Special Requests   Final    BOTTLES DRAWN AEROBIC AND ANAEROBIC Blood Culture adequate volume   Culture  Setup Time   Final    GRAM POSITIVE COCCI AEROBIC BOTTLE ONLY CRITICAL RESULT CALLED TO, READ BACK BY AND VERIFIED WITH: Sheema Hallaji @ 0800 09/15/16 by [facility]    Culture (A)  Final    STAPHYLOCOCCUS SPECIES (COAGULASE NEGATIVE) THE SIGNIFICANCE OF ISOLATING THIS ORGANISM FROM A SINGLE SET OF BLOOD CULTURES WHEN MULTIPLE SETS ARE DRAWN IS UNCERTAIN. PLEASE NOTIFY THE MICROBIOLOGY DEPARTMENT WITHIN ONE WEEK IF SPECIATION AND SENSITIVITIES ARE REQUIRED. Performed at [facility], [place] [address], [place], [place] [address]    Report Status 09/17/2016 FINAL  Final  Blood Culture ID Panel (Reflexed)     Status: Abnormal   Collection Time: 09/14/16   2:53 PM  Result Value Ref Range Status   Enterococcus species (A) NOT DETECTED Final    THIS TEST WAS ORDERED IN ERROR AND HAS BEEN CREDITED.    Comment: INCORRECT PANEL SET UP. SPECIMEN AGE LIMIT EXCEEDED WHEN ERROR WAS DISCOVERED. CALLED TO CHRISTINE KATSOUDAS 09/16/16 1100. SGD   Listeria monocytogenes TEST WILL BE CREDITED (A) NOT DETECTED Final   Staphylococcus species TEST WILL BE CREDITED (A) NOT DETECTED Final   Staphylococcus aureus TEST WILL BE CREDITED (A) NOT DETECTED Final   Streptococcus species TEST WILL BE CREDITED (A) NOT DETECTED Final   Streptococcus agalactiae TEST WILL BE CREDITED (A) NOT DETECTED Final   Streptococcus pneumoniae TEST WILL BE CREDITED (A) NOT DETECTED Final   Streptococcus pyogenes TEST WILL BE CREDITED (A) NOT DETECTED Final   Acinetobacter baumannii TEST WILL BE CREDITED (A) NOT DETECTED Final   Enterobacteriaceae species TEST WILL BE CREDITED (A) NOT DETECTED Final   Enterobacter cloacae complex TEST WILL BE CREDITED (A) NOT DETECTED Final   Escherichia coli TEST WILL BE CREDITED (A) NOT DETECTED Final   Klebsiella oxytoca TEST WILL BE CREDITED (A) NOT DETECTED Final   Klebsiella pneumoniae TEST WILL BE CREDITED (A) NOT DETECTED Final   Proteus species TEST WILL BE CREDITED (A) NOT DETECTED Final   Serratia marcescens TEST WILL BE CREDITED (A) NOT DETECTED Final   Haemophilus influenzae TEST WILL BE CREDITED (A) NOT DETECTED Final   Neisseria meningitidis TEST WILL BE CREDITED (A) NOT DETECTED Final   Pseudomonas aeruginosa TEST WILL BE CREDITED (A) NOT DETECTED Final   Candida albicans TEST WILL BE CREDITED (A) NOT DETECTED Final   Candida glabrata TEST WILL BE CREDITED (A) NOT DETECTED Final   Candida krusei TEST WILL BE CREDITED (A) NOT DETECTED Final   Candida parapsilosis TEST WILL BE CREDITED (A) NOT DETECTED Final   Candida tropicalis TEST WILL BE CREDITED (A) NOT DETECTED Final  Blood Culture (routine x 2)     Status: Abnormal   Collection  Time: 09/14/16  2:58 PM  Result Value Ref Range Status   Specimen Description BLOOD BLOOD LEFT HAND  Final   Special Requests   Final    BOTTLES DRAWN AEROBIC AND ANAEROBIC Blood Culture adequate volume   Culture  Setup Time   Final    GRAM POSITIVE RODS AEROBIC BOTTLE ONLY CRITICAL RESULT CALLED TO, READ BACK BY AND VERIFIED WITH: MATT MCBANE AT 6010 ON 09/15/16 RWW CONFIRMED BY [facility]    Culture (A)  Final    BACILLUS SPECIES Standardized susceptibility testing for this organism is not available. Performed at [facility], [place]  [address], [place], [place] [address]    Report Status 09/17/2016 FINAL  Final  Blood Culture ID Panel (Reflexed)     Status: None   Collection Time: 09/14/16  2:58 PM  Result Value Ref Range Status   Enterococcus species NOT DETECTED NOT DETECTED Final   Listeria monocytogenes NOT DETECTED NOT DETECTED Final   Staphylococcus species NOT DETECTED NOT DETECTED Final   Staphylococcus aureus NOT DETECTED NOT DETECTED Final   Streptococcus species NOT DETECTED NOT DETECTED Final   Streptococcus agalactiae NOT DETECTED NOT DETECTED Final   Streptococcus pneumoniae NOT DETECTED NOT DETECTED Final   Streptococcus pyogenes NOT DETECTED NOT DETECTED Final   Acinetobacter baumannii NOT DETECTED NOT DETECTED Final   Enterobacteriaceae species NOT DETECTED NOT DETECTED Final   Enterobacter cloacae complex NOT DETECTED NOT DETECTED Final   Escherichia coli NOT DETECTED NOT DETECTED Final   Klebsiella oxytoca NOT DETECTED NOT DETECTED Final   Klebsiella pneumoniae NOT DETECTED NOT DETECTED Final   Proteus species NOT DETECTED NOT DETECTED Final   Serratia marcescens NOT DETECTED NOT DETECTED Final   Haemophilus influenzae NOT DETECTED NOT DETECTED Final   Neisseria meningitidis NOT DETECTED NOT DETECTED Final   Pseudomonas aeruginosa NOT DETECTED NOT DETECTED Final   Candida albicans NOT DETECTED NOT DETECTED Final   Candida glabrata NOT DETECTED NOT DETECTED  Final   Candida krusei NOT DETECTED NOT DETECTED Final   Candida parapsilosis NOT DETECTED NOT DETECTED Final   Candida tropicalis NOT DETECTED NOT DETECTED Final  Body fluid culture (includes gram stain)     Status: None   Collection Time: 09/14/16  3:47 PM  Result Value Ref Range Status   Specimen Description PLEURAL  Final   Special Requests PLEURAL  Final   Gram Stain   Final    RARE WBC PRESENT, PREDOMINANTLY MONONUCLEAR NO ORGANISMS SEEN    Culture   Final    NO GROWTH 3 DAYS Performed at [facility], [address], [place], [place] [address]    Report Status 09/18/2016 FINAL  Final  Culture, sputum-assessment     Status: None   Collection Time: 09/14/16  6:32 PM  Result Value Ref Range Status   Specimen Description SPUTUM  Final   Special Requests Immunocompromised  Final   Sputum evaluation THIS SPECIMEN IS ACCEPTABLE FOR SPUTUM CULTURE  Final   Report Status 09/14/2016 FINAL  Final  Culture, respiratory (NON-Expectorated)     Status: None   Collection Time: 09/14/16  6:32 PM  Result Value Ref Range Status   Specimen Description SPUTUM  Final   Special Requests Immunocompromised Reflexed from [id number]  Final   Gram Stain   Final    FEW WBC PRESENT,BOTH PMN AND MONONUCLEAR FEW GRAM NEGATIVE RODS FEW GRAM NEGATIVE COCCI IN PAIRS    Culture   Final    Consistent with normal respiratory flora. Performed at [facility], [place] [address], [place],  [address]    Report Status 09/17/2016 FINAL  Final  MRSA PCR Screening     Status: Abnormal   Collection Time: 09/15/16  8:30 PM  Result Value Ref Range Status   MRSA by PCR POSITIVE (A) NEGATIVE Corrected    Comment: MARSHA HATCH  AT 2155 ON 09/15/16 RWW        The GeneXpert MRSA Assay (FDA approved for NASAL specimens only), is one component of a comprehensive MRSA colonization surveillance program. It is not intended to diagnose MRSA infection nor to guide or monitor  treatment for MRSA  infections. CORRECTED ON 06/18 AT 0544: PREVIOUSLY REPORTED AS POSITIVE        The GeneXpert MRSA Assay (FDA approved for NASAL specimens only), is one component of a comprehensive MRSA colonization surveillance program. It is not intended to diagnose MRSA infection  nor to guide or monitor treatment for MRSA infections. RESULT CALLED TO, READ BACK BY AND VERIFIED WITH: [facility] AT 2155 ON 09/15/16 RWW     Studies/Results: Dg Chest 2 View  Result Date: 09/17/2016 CLINICAL DATA:  Shortness of breath. History of CHF and COPD. Former smoker. EXAM: CHEST  2 VIEW COMPARISON:  PA and lateral chest x-ray of September 15, 2016 FINDINGS: Dense infiltrate in the right lower lobe has improved. There is persistent increased interstitial density within both lungs. The heart is normal in size. The pulmonary vascularity is not engorged. There is calcification in the wall of the aortic arch. The bony thorax exhibits no acute abnormality. IMPRESSION: Improving right lower lobe pneumonia. Persistent increased interstitial markings likely reflect the patient's COPD and smoking history. Thoracic aortic atherosclerosis. Electronically Signed   By: Mirl Hillery  [place] M.D.   On: 09/17/2016 08:23   Dg Chest Port 1 View  Result Date: 09/18/2016 CLINICAL DATA:  Pt's oxygen decreased. Pt is SOB. Hx of CHF, COPD, Diabetes and HTN. Hx of dialysis catheter. EXAM: PORTABLE CHEST - 1 VIEW COMPARISON:  09/17/2016 FINDINGS: Improvement in the patchy right lower lung airspace disease. Interstitial opacities in both lung bases are stable. No new infiltrate or overt edema. Heart size appears mildly enlarged for technique. Atheromatous aorta. No effusion.  No pneumothorax. Visualized bones unremarkable. IMPRESSION: Improving right lower lobe infiltrate. Aortic Atherosclerosis (ICD10-170.0) Electronically Signed   By: Lucrezia Europe M.D.   On: 09/18/2016 08:21    Assessment/Plan: Calvin Good is a 76 y.o. 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 BCX + bacillus x1 and CNS x 1.  Pleural fluid studies do not suggest infection and cx negative. Was clinically improving but declined due to resp distress and in Unit now.  Recommendations Cont  treatment for HCAP with vanco and ceftazidime. These can be given at HD. Rec a 10 day course Check sputum cx. Since he is febrile and confused now I am concerned for aspiration so will start flagyl. Thank you very much for the consult. Will follow with you.  [place] Kappes P   09/18/2016, 2:21 PM

## 2016-09-18 NOTE — Progress Notes (Signed)
Hd start 

## 2016-09-18 NOTE — Progress Notes (Signed)
[facility]  ROUNDING NOTE   Subjective:   [name] admitted to [facility] on 09/14/2016 for Shortness of breath [R06.02] Pleural effusion [J90] HCAP (healthcare-associated pneumonia) [J18.9] Acute on chronic respiratory failure with hypoxia ([place]) [J96.21]  Patient found to have right sided pleural effusion. Thoracentesis done. 1060 mL pleural fluid removed.  This morning transferred to stepdown in the ICU for respiratory distress. Currently has NIPPV Very somnolent   Objective:  Vital signs in last 24 hours:  Temp:  [98.2 F (36.8 C)-100 F (37.8 C)] 100 F (37.8 C) (06/20 1100) Pulse Rate:  [84-130] 87 (06/20 1215) Resp:  [11-28] 11 (06/20 1215) BP: (139-182)/(42-90) 158/46 (06/20 1215) SpO2:  [76 %-100 %] 97 % (06/20 1215) Weight:  [105 kg (231 lb 7.7 oz)] 105 kg (231 lb 7.7 oz) (06/20 1050)  Weight change:  Filed Weights   09/14/16 1240 09/16/16 0940 09/18/16 1050  Weight: 106.6 kg (235 lb) 109 kg (240 lb 4.8 oz) 105 kg (231 lb 7.7 oz)    Intake/Output: I/O last 3 completed shifts: In: 170 [P.O.:120; IV Piggyback:50] Out: -    Intake/Output this shift:  No intake/output data recorded.  Physical Exam: General: NAD,    Head: Moist oral mucosal membranes  Eyes: Anicteric,    Neck: Supple,  Lungs:  Bilateral wheezing, crackles at bases, NIPPV  Heart: No rub  Abdomen:  Soft, nontender,   Extremities: no peripheral edema.  Neurologic: Somnolent, answering a few questions   Skin: No lesions     Access: Left upper arm AVF     Basic Metabolic Panel:  Recent Labs Lab 09/14/16 1249 09/15/16 0510 09/16/16 0450  NA 134* 135 136  K 3.9 3.5 3.5  CL 96* 99* 100*  CO2 28 28 28   GLUCOSE 280* 260* 98  BUN 27* 39* 49*  CREATININE 3.98* 4.81* 6.16*  CALCIUM 8.9 8.6* 8.8*    Liver Function Tests:  Recent Labs Lab 09/15/16 0510  AST 13*  ALT 13*  ALKPHOS 54  BILITOT 0.9  PROT 6.5  ALBUMIN 3.4*   No results for input(s): LIPASE, AMYLASE  in the last 168 hours. No results for input(s): AMMONIA in the last 168 hours.  CBC:  Recent Labs Lab 09/14/16 1249 09/15/16 0510 09/16/16 0450 09/18/16 1200  WBC 8.2 10.5 8.6 8.2  NEUTROABS 5.8  --   --   --   HGB 10.3* 9.9* 9.3* 8.8*  HCT 29.4* 29.0* 27.4* 26.5*  MCV 86.9 87.0 87.6 88.8  PLT 175 160 139* 146*    Cardiac Enzymes:  Recent Labs Lab 09/14/16 1249  TROPONINI <0.03    BNP: Invalid input(s): POCBNP  CBG:  Recent Labs Lab 09/17/16 1141 09/17/16 1643 09/17/16 2110 09/18/16 0733 09/18/16 0957  GLUCAP 254* 207* 246* 204* 191*    Microbiology: Results for orders placed or performed during the hospital encounter of 09/14/16  Blood Culture (routine x 2)     Status: Abnormal   Collection Time: 09/14/16  2:53 PM  Result Value Ref Range Status   Specimen Description BLOOD RIGHT [facility]  Final   Special Requests   Final    BOTTLES DRAWN AEROBIC AND ANAEROBIC Blood Culture adequate volume   Culture  Setup Time   Final    GRAM POSITIVE COCCI AEROBIC BOTTLE ONLY CRITICAL RESULT CALLED TO, READ BACK BY AND VERIFIED WITH: Sheema Hallaji @ 0800 09/15/16 by [facility]    Culture (A)  Final    STAPHYLOCOCCUS SPECIES (COAGULASE NEGATIVE) THE SIGNIFICANCE OF  ISOLATING THIS ORGANISM FROM A SINGLE SET OF BLOOD CULTURES WHEN MULTIPLE SETS ARE DRAWN IS UNCERTAIN. PLEASE NOTIFY THE MICROBIOLOGY DEPARTMENT WITHIN ONE WEEK IF SPECIATION AND SENSITIVITIES ARE REQUIRED. Performed at [facility], [address] [place], [place] [address]    Report Status 09/17/2016 FINAL  Final  Blood Culture ID Panel (Reflexed)     Status: Abnormal   Collection Time: 09/14/16  2:53 PM  Result Value Ref Range Status   Enterococcus species (A) NOT DETECTED Final    THIS TEST WAS ORDERED IN ERROR AND HAS BEEN CREDITED.    Comment: INCORRECT PANEL SET UP. SPECIMEN AGE LIMIT EXCEEDED WHEN ERROR WAS DISCOVERED. CALLED TO CHRISTINE KATSOUDAS 09/16/16 1100. SGD   Listeria monocytogenes TEST WILL  BE CREDITED (A) NOT DETECTED Final   Staphylococcus species TEST WILL BE CREDITED (A) NOT DETECTED Final   Staphylococcus aureus TEST WILL BE CREDITED (A) NOT DETECTED Final   Streptococcus species TEST WILL BE CREDITED (A) NOT DETECTED Final   Streptococcus agalactiae TEST WILL BE CREDITED (A) NOT DETECTED Final   Streptococcus pneumoniae TEST WILL BE CREDITED (A) NOT DETECTED Final   Streptococcus pyogenes TEST WILL BE CREDITED (A) NOT DETECTED Final   Acinetobacter baumannii TEST WILL BE CREDITED (A) NOT DETECTED Final   Enterobacteriaceae species TEST WILL BE CREDITED (A) NOT DETECTED Final   Enterobacter cloacae complex TEST WILL BE CREDITED (A) NOT DETECTED Final   Escherichia coli TEST WILL BE CREDITED (A) NOT DETECTED Final   Klebsiella oxytoca TEST WILL BE CREDITED (A) NOT DETECTED Final   Klebsiella pneumoniae TEST WILL BE CREDITED (A) NOT DETECTED Final   Proteus species TEST WILL BE CREDITED (A) NOT DETECTED Final   Serratia marcescens TEST WILL BE CREDITED (A) NOT DETECTED Final   Haemophilus influenzae TEST WILL BE CREDITED (A) NOT DETECTED Final   Neisseria meningitidis TEST WILL BE CREDITED (A) NOT DETECTED Final   Pseudomonas aeruginosa TEST WILL BE CREDITED (A) NOT DETECTED Final   Candida albicans TEST WILL BE CREDITED (A) NOT DETECTED Final   Candida glabrata TEST WILL BE CREDITED (A) NOT DETECTED Final   Candida krusei TEST WILL BE CREDITED (A) NOT DETECTED Final   Candida parapsilosis TEST WILL BE CREDITED (A) NOT DETECTED Final   Candida tropicalis TEST WILL BE CREDITED (A) NOT DETECTED Final  Blood Culture (routine x 2)     Status: Abnormal   Collection Time: 09/14/16  2:58 PM  Result Value Ref Range Status   Specimen Description BLOOD BLOOD LEFT HAND  Final   Special Requests   Final    BOTTLES DRAWN AEROBIC AND ANAEROBIC Blood Culture adequate volume   Culture  Setup Time   Final    GRAM POSITIVE RODS AEROBIC BOTTLE ONLY CRITICAL RESULT CALLED TO, READ BACK  BY AND VERIFIED WITH: MATT MCBANE AT 6283 ON 09/15/16 RWW CONFIRMED BY [facility]    Culture (A)  Final    BACILLUS SPECIES Standardized susceptibility testing for this organism is not available. Performed at [facility], [place] [address], [place], [place] [address]    Report Status 09/17/2016 FINAL  Final  Blood Culture ID Panel (Reflexed)     Status: None   Collection Time: 09/14/16  2:58 PM  Result Value Ref Range Status   Enterococcus species NOT DETECTED NOT DETECTED Final   Listeria monocytogenes NOT DETECTED NOT DETECTED Final   Staphylococcus species NOT DETECTED NOT DETECTED Final   Staphylococcus aureus NOT DETECTED NOT DETECTED Final  Streptococcus species NOT DETECTED NOT DETECTED Final   Streptococcus agalactiae NOT DETECTED NOT DETECTED Final   Streptococcus pneumoniae NOT DETECTED NOT DETECTED Final   Streptococcus pyogenes NOT DETECTED NOT DETECTED Final   Acinetobacter baumannii NOT DETECTED NOT DETECTED Final   Enterobacteriaceae species NOT DETECTED NOT DETECTED Final   Enterobacter cloacae complex NOT DETECTED NOT DETECTED Final   Escherichia coli NOT DETECTED NOT DETECTED Final   Klebsiella oxytoca NOT DETECTED NOT DETECTED Final   Klebsiella pneumoniae NOT DETECTED NOT DETECTED Final   Proteus species NOT DETECTED NOT DETECTED Final   Serratia marcescens NOT DETECTED NOT DETECTED Final   Haemophilus influenzae NOT DETECTED NOT DETECTED Final   Neisseria meningitidis NOT DETECTED NOT DETECTED Final   Pseudomonas aeruginosa NOT DETECTED NOT DETECTED Final   Candida albicans NOT DETECTED NOT DETECTED Final   Candida glabrata NOT DETECTED NOT DETECTED Final   Candida krusei NOT DETECTED NOT DETECTED Final   Candida parapsilosis NOT DETECTED NOT DETECTED Final   Candida tropicalis NOT DETECTED NOT DETECTED Final  Body fluid culture (includes gram stain)     Status: None   Collection Time: 09/14/16  3:47 PM  Result Value Ref Range Status   Specimen Description  PLEURAL  Final   Special Requests PLEURAL  Final   Gram Stain   Final    RARE WBC PRESENT, PREDOMINANTLY MONONUCLEAR NO ORGANISMS SEEN    Culture   Final    NO GROWTH 3 DAYS Performed at [facility], [address] [place], [place] [address]    Report Status 09/18/2016 FINAL  Final  Culture, sputum-assessment     Status: None   Collection Time: 09/14/16  6:32 PM  Result Value Ref Range Status   Specimen Description SPUTUM  Final   Special Requests Immunocompromised  Final   Sputum evaluation THIS SPECIMEN IS ACCEPTABLE FOR SPUTUM CULTURE  Final   Report Status 09/14/2016 FINAL  Final  Culture, respiratory (NON-Expectorated)     Status: None   Collection Time: 09/14/16  6:32 PM  Result Value Ref Range Status   Specimen Description SPUTUM  Final   Special Requests Immunocompromised Reflexed from [id number]  Final   Gram Stain   Final    FEW WBC PRESENT,BOTH PMN AND MONONUCLEAR FEW GRAM NEGATIVE RODS FEW GRAM NEGATIVE COCCI IN PAIRS    Culture   Final    Consistent with normal respiratory flora. Performed at [facility], [place] [address] [place], [place] [address]    Report Status 09/17/2016 FINAL  Final  MRSA PCR Screening     Status: Abnormal   Collection Time: 09/15/16  8:30 PM  Result Value Ref Range Status   MRSA by PCR POSITIVE (A) NEGATIVE Corrected    Comment: MARSHA HATCH  AT 2155 ON 09/15/16 RWW        The GeneXpert MRSA Assay (FDA approved for NASAL specimens only), is one component of a comprehensive MRSA colonization surveillance program. It is not intended to diagnose MRSA infection nor to guide or monitor treatment for MRSA infections. CORRECTED ON 06/18 AT 0544: PREVIOUSLY REPORTED AS POSITIVE        The GeneXpert MRSA Assay (FDA approved for NASAL specimens only), is one component of a comprehensive MRSA colonization surveillance program. It is not intended to diagnose MRSA infection  nor to guide or monitor treatment for MRSA infections. RESULT  CALLED TO, READ BACK BY AND VERIFIED WITH: [place] AT 2155 ON 09/15/16 RWW  Coagulation Studies: No results for input(s): LABPROT, INR in the last 72 hours.  Urinalysis: No results for input(s): COLORURINE, LABSPEC, PHURINE, GLUCOSEU, HGBUR, BILIRUBINUR, KETONESUR, PROTEINUR, UROBILINOGEN, NITRITE, LEUKOCYTESUR in the last 72 hours.  Invalid input(s): APPERANCEUR    Imaging: Dg Chest 2 View  Result Date: 09/17/2016 CLINICAL DATA:  Shortness of breath. History of CHF and COPD. Former smoker. EXAM: CHEST  2 VIEW COMPARISON:  PA and lateral chest x-ray of September 15, 2016 FINDINGS: Dense infiltrate in the right lower lobe has improved. There is persistent increased interstitial density within both lungs. The heart is normal in size. The pulmonary vascularity is not engorged. There is calcification in the wall of the aortic arch. The bony thorax exhibits no acute abnormality. IMPRESSION: Improving right lower lobe pneumonia. Persistent increased interstitial markings likely reflect the patient's COPD and smoking history. Thoracic aortic atherosclerosis. Electronically Signed   By: David  Martinique M.D.   On: 09/17/2016 08:23   Dg Chest Port 1 View  Result Date: 09/18/2016 CLINICAL DATA:  Pt's oxygen decreased. Pt is SOB. Hx of CHF, COPD, Diabetes and HTN. Hx of dialysis catheter. EXAM: PORTABLE CHEST - 1 VIEW COMPARISON:  09/17/2016 FINDINGS: Improvement in the patchy right lower lung airspace disease. Interstitial opacities in both lung bases are stable. No new infiltrate or overt edema. Heart size appears mildly enlarged for technique. Atheromatous aorta. No effusion.  No pneumothorax. Visualized bones unremarkable. IMPRESSION: Improving right lower lobe infiltrate. Aortic Atherosclerosis (ICD10-170.0) Electronically Signed   By: Lucrezia Europe M.D.   On: 09/18/2016 08:21     Medications:   . cefTAZidime (FORTAZ)  IV    . vancomycin Stopped (09/16/16 1256)   . amLODipine  5 mg Oral QPM  .  aspirin EC  81 mg Oral Daily  . atorvastatin  40 mg Oral Daily  . benzonatate  100 mg Oral TID  . calcium acetate  1,334 mg Oral TID WC  . carvedilol  6.25 mg Oral BID  . Chlorhexidine Gluconate Cloth  6 each Topical Q0600  . doxazosin  8 mg Oral Daily  . epoetin (EPOGEN/PROCRIT) injection  4,000 Units Intravenous Q M,W,F-HD  . heparin subcutaneous  5,000 Units Subcutaneous Q8H  . insulin aspart  0-5 Units Subcutaneous QHS  . insulin aspart  0-9 Units Subcutaneous TID WC  . insulin detemir  30 Units Subcutaneous QHS  . isosorbide mononitrate  30 mg Oral Daily  . lisinopril  20 mg Oral Daily  . mouth rinse  15 mL Mouth Rinse BID  . mupirocin ointment  1 application Nasal BID  . pantoprazole  40 mg Oral Daily   acetaminophen **OR** acetaminophen, guaiFENesin-dextromethorphan, ipratropium-albuterol, labetalol, ondansetron **OR** ondansetron (ZOFRAN) IV  Assessment/ Plan:  [name] is a 76 y.o. white male with diabetes mellitus type II insulin dependent, hypertension, coronary artery disease, hyperlipidemia, prostate cancer status post prostatectomy, right total knee, appendectomy.   MWF [facility] AVF  1. End Stage Renal Disease MWF: Urgent Hemodialysis today for volume removal  2.  Anemia of chronic kidney disease: hemoglobin 8.8 - Mircera as outpatient.  - EPO while inpatient  3.  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  Continue good control of blood sugars  4. Secondary Hyperparathyroidism:   - calcium acetate with meals  5. Acute respiratory failure Pneumonia: with pleural effusion, not consistent with empyema. Status post right thoracentesis 6/16.  - IV Abx as per ID team  Currently requiringNIPPV  LOS: 4 Icyss Skog 6/20/201812:27 PM  Late entry

## 2016-09-18 NOTE — Progress Notes (Signed)
[facility] [facility] [place] NAME: Calvin Good    MR#:  [id number]  DATE OF BIRTH:  08-06-1940  SUBJECTIVE:  CHIEF COMPLAINT:   Chief Complaint  Patient presents with  . Shortness of Breath   - Dialysis patient admitted with shortness of breath and noted to have pleural effusion, that was drained  in the emergency room - thoracentesis.  Acutely ill. Drowzy. Jerks. 91% on 6 L O2  REVIEW OF SYSTEMS:  Review of Systems  Unable to perform ROS: Mental status change    DRUG ALLERGIES:   Allergies  Allergen Reactions  . Tetracyclines & Related Other (See Comments)    Reaction: unknown happened a long time ago and pt doesn't remember reaction   . Morphine Other (See Comments)    Reaction: confusion    VITALS:  Blood pressure (!) 167/60, pulse 97, temperature 98.2 F (36.8 C), temperature source Oral, resp. rate 18, height 5\' 9"  (1.753 m), weight 109 kg (240 lb 4.8 oz), SpO2 100 %.  PHYSICAL EXAMINATION:  Physical Exam  GENERAL:  76-year-old patient lying in the bed. Looks critically ill EYES: Pupils equal, round, reactive to light and accommodation. No scleral icterus. Extraocular muscles intact.  HEENT: Head atraumatic, normocephalic. Oropharynx and nasopharynx clear.  NECK:  Supple, no jugular venous distention. No thyroid enlargement, no tenderness.  LUNGS: Bibasilar crackles R>L CARDIOVASCULAR: S1, S2 normal. No  rubs, or gallops. 2/6 systolic murmur present ABDOMEN: Soft, nontender, nondistended. Bowel sounds present. No organomegaly or mass.  EXTREMITIES: No pedal edema, cyanosis, or clubbing. Left arm AV fistula NEUROLOGIC: Cranial nerves II through XII are intact. Muscle strength 4/5 in all extremities. Sensation intact. Gait not checked.  PSYCHIATRIC: The patient is drowzy SKIN: No obvious rash, lesion, or ulcer.    LABORATORY PANEL:   CBC  Recent Labs Lab 09/16/16 0450  WBC 8.6  HGB 9.3*  HCT 27.4*  PLT 139*     ------------------------------------------------------------------------------------------------------------------  Chemistries   Recent Labs Lab 09/15/16 0510 09/16/16 0450  NA 135 136  K 3.5 3.5  CL 99* 100*  CO2 28 28  GLUCOSE 260* 98  BUN 39* 49*  CREATININE 4.81* 6.16*  CALCIUM 8.6* 8.8*  AST 13*  --   ALT 13*  --   ALKPHOS 54  --   BILITOT 0.9  --    ------------------------------------------------------------------------------------------------------------------  Cardiac Enzymes  Recent Labs Lab 09/14/16 1249  TROPONINI <0.03   ------------------------------------------------------------------------------------------------------------------  RADIOLOGY:  Dg Chest 2 View  Result Date: 09/17/2016 CLINICAL DATA:  Shortness of breath. History of CHF and COPD. Former smoker. EXAM: CHEST  2 VIEW COMPARISON:  PA and lateral chest x-ray of September 15, 2016 FINDINGS: Dense infiltrate in the right lower lobe has improved. There is persistent increased interstitial density within both lungs. The heart is normal in size. The pulmonary vascularity is not engorged. There is calcification in the wall of the aortic arch. The bony thorax exhibits no acute abnormality. IMPRESSION: Improving right lower lobe pneumonia. Persistent increased interstitial markings likely reflect the patient's COPD and smoking history. Thoracic aortic atherosclerosis. Electronically Signed   By: David  Martinique M.D.   On: 09/17/2016 08:23   Dg Chest Port 1 View  Result Date: 09/18/2016 CLINICAL DATA:  Pt's oxygen decreased. Pt is SOB. Hx of CHF, COPD, Diabetes and HTN. Hx of dialysis catheter. EXAM: PORTABLE CHEST - 1 VIEW COMPARISON:  09/17/2016 FINDINGS: Improvement in the patchy right lower lung airspace disease. Interstitial opacities  in both lung bases are stable. No new infiltrate or overt edema. Heart size appears mildly enlarged for technique. Atheromatous aorta. No effusion.  No pneumothorax. Visualized  bones unremarkable. IMPRESSION: Improving right lower lobe infiltrate. Aortic Atherosclerosis (ICD10-170.0) Electronically Signed   By: Lucrezia Europe M.D.   On: 09/18/2016 08:21    EKG:   Orders placed or performed during the hospital encounter of 09/14/16  . ED EKG  . 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  # Acute hypoxic respiratory failure-secondary to pleural effusion and also right lower lobe infiltrate. Worsening - positive MRSA PCR- cont vanc, cefepime. -Patient is status post thoracentesis. Transudate Start Bipap and transfer to ICU Discussed with Dr. Alva Garnet Critically ill  # Acute encephalopathy likely due to hypercarbia Has myoclonic jerks and drowzy Start Bipap  # End-stage renal disease on hemodialysis-on Monday, Wednesday and Friday schedule. -Nephrology consulted and for dialysis. HD today  # CAD-status post cardiac catheterization last month, has significant RCA disease and medical management was recommended at the time. -Continue aspirin, statin and Coreg.  # hypertension-continue home medications patient on Norvasc, Coreg, Lasix, Imdur, spironolactone and lisinopril.  # diabetes mellitus on Levemir and sliding scale insulin in the hospital.  # DVT prophylaxis-subcutaneous heparin.  All the records are reviewed and case discussed with Care Management/Social Worker. Management plans discussed with the patient, family and they are in agreement.  CODE STATUS: Full code  TOTAL CC TIME TAKING CARE OF THIS PATIENT: 35 minutes.   POSSIBLE D/C IN 1-2 DAYS, DEPENDING ON CLINICAL CONDITION.   Hillary Bow R M.D on 09/18/2016 at 8:46 AM  Between 7am to 6pm - Pager - [phone]  After 6pm go to  [email or web address] - password [email or web address] [facility]  Office  [phone]  CC: Primary care physician; [name], MD

## 2016-09-19 ENCOUNTER — Inpatient Hospital Stay: Payer: Medicare Other

## 2016-09-19 DIAGNOSIS — R4701 Aphasia: Secondary | ICD-10-CM

## 2016-09-19 DIAGNOSIS — G9341 Metabolic encephalopathy: Secondary | ICD-10-CM

## 2016-09-19 DIAGNOSIS — R278 Other lack of coordination: Secondary | ICD-10-CM

## 2016-09-19 DIAGNOSIS — J9612 Chronic respiratory failure with hypercapnia: Secondary | ICD-10-CM

## 2016-09-19 DIAGNOSIS — G9340 Encephalopathy, unspecified: Secondary | ICD-10-CM

## 2016-09-19 LAB — GLUCOSE, CAPILLARY
GLUCOSE-CAPILLARY: 218 mg/dL — AB (ref 65–99)
GLUCOSE-CAPILLARY: 195 mg/dL — AB (ref 65–99)
Glucose-Capillary: 186 mg/dL — ABNORMAL HIGH (ref 65–99)
Glucose-Capillary: 150 mg/dL — ABNORMAL HIGH (ref 65–99)

## 2016-09-19 LAB — CBC
HCT: 30 % — ABNORMAL LOW (ref 40.0–52.0)
Hemoglobin: 10.2 g/dL — ABNORMAL LOW (ref 13.0–18.0)
MCH: 30.4 pg (ref 26.0–34.0)
MCHC: 34.1 g/dL (ref 32.0–36.0)
MCV: 89.2 fL (ref 80.0–100.0)
PLATELETS: 145 10*3/uL — AB (ref 150–440)
RBC: 3.36 MIL/uL — ABNORMAL LOW (ref 4.40–5.90)
RDW: 13.8 % (ref 11.5–14.5)
WBC: 8 10*3/uL (ref 3.8–10.6)

## 2016-09-19 LAB — COMPREHENSIVE METABOLIC PANEL
ALBUMIN: 3.3 g/dL — AB (ref 3.5–5.0)
ALK PHOS: 43 U/L (ref 38–126)
ALT: 11 U/L — ABNORMAL LOW (ref 17–63)
ANION GAP: 8 (ref 5–15)
AST: 11 U/L — ABNORMAL LOW (ref 15–41)
BUN: 45 mg/dL — ABNORMAL HIGH (ref 6–20)
CO2: 31 mmol/L (ref 22–32)
Calcium: 8.6 mg/dL — ABNORMAL LOW (ref 8.9–10.3)
Chloride: 97 mmol/L — ABNORMAL LOW (ref 101–111)
Creatinine, Ser: 6.05 mg/dL — ABNORMAL HIGH (ref 0.61–1.24)
GFR calc non Af Amer: 8 mL/min — ABNORMAL LOW (ref >60)
GFR, EST AFRICAN AMERICAN: 9 mL/min — AB (ref >60)
GLUCOSE: 148 mg/dL — AB (ref 65–99)
POTASSIUM: 4.1 mmol/L (ref 3.5–5.1)
Sodium: 136 mmol/L (ref 135–145)
Total Bilirubin: 0.8 mg/dL (ref 0.3–1.2)
Total Protein: 7 g/dL (ref 6.5–8.1)

## 2016-09-19 NOTE — Plan of Care (Signed)
Problem: Safety: Goal: Ability to remain free from injury will improve Outcome: Progressing Patient have no new indications of injuries on body. Will continue to turn patient as need and assess skin.   Problem: Pain Managment: Goal: General experience of comfort will improve Outcome: Progressing Patient did not complain of pain for nurse. Was able to sleep comfortable throughout the shift.

## 2016-09-19 NOTE — Progress Notes (Signed)
[facility] [place] NAME: Calvin Good    MR#:  [id number]  DATE OF BIRTH:  07-11-1940  SUBJECTIVE:  CHIEF COMPLAINT:   Chief Complaint  Patient presents with  . Shortness of Breath   - Dialysis patient admitted with shortness of breath and noted to have pleural effusion, that was drained  in the emergency room - thoracentesis.  Acutely ill. Drowzy. Not talking much and 91% on 2 L O2  REVIEW OF SYSTEMS:  Review of Systems  Unable to perform ROS: Mental status change    DRUG ALLERGIES:   Allergies  Allergen Reactions  . Tetracyclines & Related Other (See Comments)    Reaction: unknown happened a long time ago and pt doesn't remember reaction   . Morphine Other (See Comments)    Reaction: confusion    VITALS:  Blood pressure (!) 152/47, pulse (!) 101, temperature 98.4 F (36.9 C), temperature source Oral, resp. rate 12, height 5\' 9"  (1.753 m), weight 105 kg (231 lb 7.7 oz), SpO2 92 %.  PHYSICAL EXAMINATION:  Physical Exam  GENERAL:  76 y.o.-year-old patient lying in the bed. Looks critically ill EYES: Pupils equal, round, reactive to light and accommodation. No scleral icterus. Extraocular muscles intact.  HEENT: Head atraumatic, normocephalic. Oropharynx and nasopharynx clear.  NECK:  Supple, no jugular venous distention. No thyroid enlargement, no tenderness.  LUNGS: Bibasilar crackles R>L CARDIOVASCULAR: S1, S2 normal. No  rubs, or gallops. 2/6 systolic murmur present ABDOMEN: Soft, nontender, nondistended. Bowel sounds present. No organomegaly or mass.  EXTREMITIES: No pedal edema, cyanosis, or clubbing. Left arm AV fistula NEUROLOGIC: The patient is very lethargic and not communicating much Muscle strength 4/5 in all extremities.  Gait not checked.  PSYCHIATRIC: The patient is drowzy SKIN: No obvious rash, lesion, or ulcer.    LABORATORY PANEL:   CBC  Recent Labs Lab 09/19/16 0418  WBC 8.0  HGB 10.2*  HCT  30.0*  PLT 145*   ------------------------------------------------------------------------------------------------------------------  Chemistries   Recent Labs Lab 09/19/16 0418  NA 136  K 4.1  CL 97*  CO2 31  GLUCOSE 148*  BUN 45*  CREATININE 6.05*  CALCIUM 8.6*  AST 11*  ALT 11*  ALKPHOS 43  BILITOT 0.8   ------------------------------------------------------------------------------------------------------------------  Cardiac Enzymes  Recent Labs Lab 09/14/16 1249  TROPONINI <0.03   ------------------------------------------------------------------------------------------------------------------  RADIOLOGY:  Ct Head Wo Contrast  Result Date: 09/19/2016 CLINICAL DATA:  Altered mental status.  Hypoxia. EXAM: CT HEAD WITHOUT CONTRAST TECHNIQUE: Contiguous axial images were obtained from the base of the skull through the vertex without intravenous contrast. COMPARISON:  None. FINDINGS: Brain: There is mild diffuse atrophy. There is no intracranial mass, hemorrhage, extra-axial fluid collection, or midline shift. There is mild small vessel disease in the centra semiovale bilaterally. Elsewhere gray-white compartments appear normal. No evident acute infarct. Vascular: There is no appreciable hyperdense vessel. There is calcification in carotid siphon. Skull: The bony calvarium appears intact. Sinuses/Orbits: There is mucosal thickening in several ethmoid air cells bilaterally. Other visualized paranasal sinuses are clear. Orbits bilaterally appear symmetric. Other: Visualized mastoid air cells are clear. IMPRESSION: Mild atrophy with mild periventricular small vessel disease. No intracranial mass hemorrhage, or extra-axial fluid collection. No acute infarct evident. Foci of arterial vascular calcification noted. Mild ethmoid sinus disease. Electronically Signed   By: Lowella Grip III M.D.   On: 09/19/2016 12:28   Dg Chest Port 1 View  Result Date: 09/18/2016 CLINICAL DATA:   Pt's  oxygen decreased. Pt is SOB. Hx of CHF, COPD, Diabetes and HTN. Hx of dialysis catheter. EXAM: PORTABLE CHEST - 1 VIEW COMPARISON:  09/17/2016 FINDINGS: Improvement in the patchy right lower lung airspace disease. Interstitial opacities in both lung bases are stable. No new infiltrate or overt edema. Heart size appears mildly enlarged for technique. Atheromatous aorta. No effusion.  No pneumothorax. Visualized bones unremarkable. IMPRESSION: Improving right lower lobe infiltrate. Aortic Atherosclerosis (ICD10-170.0) Electronically Signed   By: Lucrezia Europe M.D.   On: 09/18/2016 08:21    EKG:   Orders placed or performed during the hospital encounter of 09/14/16  . ED EKG  . 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  # Acute hypoxic respiratory failure-secondary to pleural effusion and also right lower lobe infiltrate. Better today, not on BiPAP - positive MRSA PCR- cont vanc, cefepime. -Patient is status post thoracentesis. Transudate -Follow up with pulmonology and infectious disease  # Acute encephalopathy -etiology unclear Has myoclonic jerks and drowzy CT head negative follow-up with neurology  # End-stage renal disease on hemodialysis-on Monday, Wednesday and Friday schedule. -Nephrology consulted and for dialysis.   # CAD-status post cardiac catheterization last month, has significant RCA disease and medical management was recommended at the time. -Continue aspirin, statin and Coreg.  # hypertension-continue home medications patient on Norvasc, Coreg, Lasix, Imdur, spironolactone and lisinopril.  # diabetes mellitus on Levemir and sliding scale insulin in the hospital.  # DVT prophylaxis-subcutaneous  heparin.  All the records are reviewed and case discussed with Care Management/Social Worker. Management plans discussed with the patient, family and they are in agreement.  CODE STATUS: Full code  TOTAL TIME TAKING CARE OF THIS PATIENT: 35 minutes.   POSSIBLE D/C IN 2-3 DAYS, DEPENDING ON CLINICAL CONDITION.   Nicholes Mango M.D on 09/19/2016 at 12:33 PM  Between 7am to 6pm - Pager - [phone]  After 6pm go to [email or web address] - password [email or web address] [facility]  Office  [phone]  CC: Primary care physician; [name], MD

## 2016-09-19 NOTE — Progress Notes (Signed)
[facility]  ROUNDING NOTE   Subjective:   Mr. Breion Novacek admitted to [facility] on 09/14/2016 for Shortness of breath [R06.02] Pleural effusion [J90] HCAP (healthcare-associated pneumonia) [J18.9] Acute on chronic respiratory failure with hypoxia ([place]) [J96.21]  Patient found to have right sided pleural effusion. Thoracentesis done. 1060 mL pleural fluid removed.  Underwent urgent HD yesterday. 2.2 L of fluid was removed Able to follow commands but not talking    Objective:  Vital signs in last 24 hours:  Temp:  [98.4 F (36.9 C)-101.6 F (38.7 C)] 98.4 F (36.9 C) (06/21 0800) Pulse Rate:  [77-108] 101 (06/21 0800) Resp:  [9-28] 12 (06/21 0800) BP: (116-182)/(35-137) 152/47 (06/21 0800) SpO2:  [89 %-100 %] 92 % (06/21 0800) FiO2 (%):  [28 %] 28 % (06/20 2000) Weight:  [105 kg (231 lb 7.7 oz)] 105 kg (231 lb 7.7 oz) (06/20 1050)  Weight change:  Filed Weights   09/14/16 1240 09/16/16 0940 09/18/16 1050  Weight: 106.6 kg (235 lb) 109 kg (240 lb 4.8 oz) 105 kg (231 lb 7.7 oz)    Intake/Output: I/O last 3 completed shifts: In: 200 [IV Piggyback:200] Out: 2250 [Urine:250; Other:2000]   Intake/Output this shift:  No intake/output data recorded.  Physical Exam: General: NAD,    Head: Moist oral mucosal membranes  Eyes: Anicteric,    Neck: Supple,  Lungs:  Mild  crackles at right  base, Reed O2  Heart: No rub  Abdomen:  Soft, nontender,   Extremities: + peripheral edema.  Neurologic: Somnolent, not speaking but able to follow commands         Access: Left upper arm AVF     Basic Metabolic Panel:  Recent Labs Lab 09/14/16 1249 09/15/16 0510 09/16/16 0450 09/18/16 1200 09/19/16 0418  NA 134* 135 136 130* 136  K 3.9 3.5 3.5 4.5 4.1  CL 96* 99* 100* 94* 97*  CO2 28 28 28 26 31   GLUCOSE 280* 260* 98 168* 148*  BUN 27* 39* 49* 62* 45*  CREATININE 3.98* 4.81* 6.16* 7.61* 6.05*  CALCIUM 8.9 8.6* 8.8* 8.6* 8.6*  PHOS  --   --   --  4.8*  --      Liver Function Tests:  Recent Labs Lab 09/15/16 0510 09/18/16 1200 09/19/16 0418  AST 13*  --  11*  ALT 13*  --  11*  ALKPHOS 54  --  43  BILITOT 0.9  --  0.8  PROT 6.5  --  7.0  ALBUMIN 3.4* 3.3* 3.3*   No results for input(s): LIPASE, AMYLASE in the last 168 hours. No results for input(s): AMMONIA in the last 168 hours.  CBC:  Recent Labs Lab 09/14/16 1249 09/15/16 0510 09/16/16 0450 09/18/16 1200 09/19/16 0418  WBC 8.2 10.5 8.6 8.2 8.0  NEUTROABS 5.8  --   --   --   --   HGB 10.3* 9.9* 9.3* 8.8* 10.2*  HCT 29.4* 29.0* 27.4* 26.5* 30.0*  MCV 86.9 87.0 87.6 88.8 89.2  PLT 175 160 139* 146* 145*    Cardiac Enzymes:  Recent Labs Lab 09/14/16 1249  TROPONINI <0.03    BNP: Invalid input(s): POCBNP  CBG:  Recent Labs Lab 09/18/16 0733 09/18/16 0957 09/18/16 1647 09/18/16 2138 09/19/16 0721  GLUCAP 204* 191* 181* 178* 186*    Microbiology: Results for orders placed or performed during the hospital encounter of 09/14/16  Blood Culture (routine x 2)     Status: Abnormal   Collection Time: 09/14/16  2:53  PM  Result Value Ref Range Status   Specimen Description BLOOD RIGHT AC  Final   Special Requests   Final    BOTTLES DRAWN AEROBIC AND ANAEROBIC Blood Culture adequate volume   Culture  Setup Time   Final    GRAM POSITIVE COCCI AEROBIC BOTTLE ONLY CRITICAL RESULT CALLED TO, READ BACK BY AND VERIFIED WITH: Sheema Hallaji @ 0800 09/15/16 by [facility]    Culture (A)  Final    STAPHYLOCOCCUS SPECIES (COAGULASE NEGATIVE) THE SIGNIFICANCE OF ISOLATING THIS ORGANISM FROM A SINGLE SET OF BLOOD CULTURES WHEN MULTIPLE SETS ARE DRAWN IS UNCERTAIN. PLEASE NOTIFY THE MICROBIOLOGY DEPARTMENT WITHIN ONE WEEK IF SPECIATION AND SENSITIVITIES ARE REQUIRED. Performed at [facility], [place] [address], [place], [place] [address]    Report Status 09/17/2016 FINAL  Final  Blood Culture ID Panel (Reflexed)     Status: Abnormal   Collection Time: 09/14/16  2:53 PM   Result Value Ref Range Status   Enterococcus species (A) NOT DETECTED Final    THIS TEST WAS ORDERED IN ERROR AND HAS BEEN CREDITED.    Comment: INCORRECT PANEL SET UP. SPECIMEN AGE LIMIT EXCEEDED WHEN ERROR WAS DISCOVERED. CALLED TO CHRISTINE KATSOUDAS 09/16/16 1100. SGD   Listeria monocytogenes TEST WILL BE CREDITED (A) NOT DETECTED Final   Staphylococcus species TEST WILL BE CREDITED (A) NOT DETECTED Final   Staphylococcus aureus TEST WILL BE CREDITED (A) NOT DETECTED Final   Streptococcus species TEST WILL BE CREDITED (A) NOT DETECTED Final   Streptococcus agalactiae TEST WILL BE CREDITED (A) NOT DETECTED Final   Streptococcus pneumoniae TEST WILL BE CREDITED (A) NOT DETECTED Final   Streptococcus pyogenes TEST WILL BE CREDITED (A) NOT DETECTED Final   Acinetobacter baumannii TEST WILL BE CREDITED (A) NOT DETECTED Final   Enterobacteriaceae species TEST WILL BE CREDITED (A) NOT DETECTED Final   Enterobacter cloacae complex TEST WILL BE CREDITED (A) NOT DETECTED Final   Escherichia coli TEST WILL BE CREDITED (A) NOT DETECTED Final   Klebsiella oxytoca TEST WILL BE CREDITED (A) NOT DETECTED Final   Klebsiella pneumoniae TEST WILL BE CREDITED (A) NOT DETECTED Final   Proteus species TEST WILL BE CREDITED (A) NOT DETECTED Final   Serratia marcescens TEST WILL BE CREDITED (A) NOT DETECTED Final   Haemophilus influenzae TEST WILL BE CREDITED (A) NOT DETECTED Final   Neisseria meningitidis TEST WILL BE CREDITED (A) NOT DETECTED Final   Pseudomonas aeruginosa TEST WILL BE CREDITED (A) NOT DETECTED Final   Candida albicans TEST WILL BE CREDITED (A) NOT DETECTED Final   Candida glabrata TEST WILL BE CREDITED (A) NOT DETECTED Final   Candida krusei TEST WILL BE CREDITED (A) NOT DETECTED Final   Candida parapsilosis TEST WILL BE CREDITED (A) NOT DETECTED Final   Candida tropicalis TEST WILL BE CREDITED (A) NOT DETECTED Final  Blood Culture (routine x 2)     Status: Abnormal   Collection Time:  09/14/16  2:58 PM  Result Value Ref Range Status   Specimen Description BLOOD BLOOD LEFT HAND  Final   Special Requests   Final    BOTTLES DRAWN AEROBIC AND ANAEROBIC Blood Culture adequate volume   Culture  Setup Time   Final    GRAM POSITIVE RODS AEROBIC BOTTLE ONLY CRITICAL RESULT CALLED TO, READ BACK BY AND VERIFIED WITH: MATT MCBANE AT 1308 ON 09/15/16 RWW CONFIRMED BY [facility]    Culture (A)  Final    BACILLUS SPECIES Standardized susceptibility testing for this organism  is not available. Performed at [facility], [place] [address] [place], [place] [address]    Report Status 09/17/2016 FINAL  Final  Blood Culture ID Panel (Reflexed)     Status: None   Collection Time: 09/14/16  2:58 PM  Result Value Ref Range Status   Enterococcus species NOT DETECTED NOT DETECTED Final   Listeria monocytogenes NOT DETECTED NOT DETECTED Final   Staphylococcus species NOT DETECTED NOT DETECTED Final   Staphylococcus aureus NOT DETECTED NOT DETECTED Final   Streptococcus species NOT DETECTED NOT DETECTED Final   Streptococcus agalactiae NOT DETECTED NOT DETECTED Final   Streptococcus pneumoniae NOT DETECTED NOT DETECTED Final   Streptococcus pyogenes NOT DETECTED NOT DETECTED Final   Acinetobacter baumannii NOT DETECTED NOT DETECTED Final   Enterobacteriaceae species NOT DETECTED NOT DETECTED Final   Enterobacter cloacae complex NOT DETECTED NOT DETECTED Final   Escherichia coli NOT DETECTED NOT DETECTED Final   Klebsiella oxytoca NOT DETECTED NOT DETECTED Final   Klebsiella pneumoniae NOT DETECTED NOT DETECTED Final   Proteus species NOT DETECTED NOT DETECTED Final   Serratia marcescens NOT DETECTED NOT DETECTED Final   Haemophilus influenzae NOT DETECTED NOT DETECTED Final   Neisseria meningitidis NOT DETECTED NOT DETECTED Final   Pseudomonas aeruginosa NOT DETECTED NOT DETECTED Final   Candida albicans NOT DETECTED NOT DETECTED Final   Candida glabrata NOT DETECTED NOT DETECTED Final    Candida krusei NOT DETECTED NOT DETECTED Final   Candida parapsilosis NOT DETECTED NOT DETECTED Final   Candida tropicalis NOT DETECTED NOT DETECTED Final  Body fluid culture (includes gram stain)     Status: None   Collection Time: 09/14/16  3:47 PM  Result Value Ref Range Status   Specimen Description PLEURAL  Final   Special Requests PLEURAL  Final   Gram Stain   Final    RARE WBC PRESENT, PREDOMINANTLY MONONUCLEAR NO ORGANISMS SEEN    Culture   Final    NO GROWTH 3 DAYS Performed at [facility], [address], [place], [place] [address]    Report Status 09/18/2016 FINAL  Final  Culture, sputum-assessment     Status: None   Collection Time: 09/14/16  6:32 PM  Result Value Ref Range Status   Specimen Description SPUTUM  Final   Special Requests Immunocompromised  Final   Sputum evaluation THIS SPECIMEN IS ACCEPTABLE FOR SPUTUM CULTURE  Final   Report Status 09/14/2016 FINAL  Final  Culture, respiratory (NON-Expectorated)     Status: None   Collection Time: 09/14/16  6:32 PM  Result Value Ref Range Status   Specimen Description SPUTUM  Final   Special Requests Immunocompromised Reflexed from [id number]  Final   Gram Stain   Final    FEW WBC PRESENT,BOTH PMN AND MONONUCLEAR FEW GRAM NEGATIVE RODS FEW GRAM NEGATIVE COCCI IN PAIRS    Culture   Final    Consistent with normal respiratory flora. Performed at [facility], [place] [address], [place], [place] [address]    Report Status 09/17/2016 FINAL  Final  MRSA PCR Screening     Status: Abnormal   Collection Time: 09/15/16  8:30 PM  Result Value Ref Range Status   MRSA by PCR POSITIVE (A) NEGATIVE Corrected    Comment: MARSHA HATCH  AT 2155 ON 09/15/16 RWW        The GeneXpert MRSA Assay (FDA approved for NASAL specimens only), is one component of a comprehensive MRSA colonization surveillance program. It is  not intended to diagnose MRSA infection nor to guide or monitor treatment for MRSA  infections. CORRECTED ON 06/18 AT 0544: PREVIOUSLY REPORTED AS POSITIVE        The GeneXpert MRSA Assay (FDA approved for NASAL specimens only), is one component of a comprehensive MRSA colonization surveillance program. It is not intended to diagnose MRSA infection  nor to guide or monitor treatment for MRSA infections. RESULT CALLED TO, READ BACK BY AND VERIFIED WITH: MARSHAATCH AT 2155 ON 09/15/16 RWW     Coagulation Studies: No results for input(s): LABPROT, INR in the last 72 hours.  Urinalysis: No results for input(s): COLORURINE, LABSPEC, PHURINE, GLUCOSEU, HGBUR, BILIRUBINUR, KETONESUR, PROTEINUR, UROBILINOGEN, NITRITE, LEUKOCYTESUR in the last 72 hours.  Invalid input(s): APPERANCEUR    Imaging: Dg Chest Port 1 View  Result Date: 09/18/2016 CLINICAL DATA:  Pt's oxygen decreased. Pt is SOB. Hx of CHF, COPD, Diabetes and HTN. Hx of dialysis catheter. EXAM: PORTABLE CHEST - 1 VIEW COMPARISON:  09/17/2016 FINDINGS: Improvement in the patchy right lower lung airspace disease. Interstitial opacities in both lung bases are stable. No new infiltrate or overt edema. Heart size appears mildly enlarged for technique. Atheromatous aorta. No effusion.  No pneumothorax. Visualized bones unremarkable. IMPRESSION: Improving right lower lobe infiltrate. Aortic Atherosclerosis (ICD10-170.0) Electronically Signed   By: Lucrezia Europe M.D.   On: 09/18/2016 08:21     Medications:   . cefTAZidime-D5W Stopped (09/18/16 1834)  . vancomycin Stopped (09/18/16 1419)   . amLODipine  5 mg Oral QPM  . aspirin EC  81 mg Oral Daily  . atorvastatin  40 mg Oral Daily  . benzonatate  100 mg Oral TID  . calcium acetate  1,334 mg Oral TID WC  . carvedilol  6.25 mg Oral BID  . Chlorhexidine Gluconate Cloth  6 each Topical Q0600  . doxazosin  8 mg Oral Daily  . epoetin (EPOGEN/PROCRIT) injection  4,000 Units Intravenous Q M,W,F-HD  . heparin subcutaneous  5,000 Units Subcutaneous Q8H  . insulin aspart  0-5 Units  Subcutaneous QHS  . insulin aspart  0-9 Units Subcutaneous TID WC  . insulin detemir  30 Units Subcutaneous QHS  . isosorbide mononitrate  30 mg Oral Daily  . lisinopril  20 mg Oral Daily  . mouth rinse  15 mL Mouth Rinse BID  . metroNIDAZOLE  500 mg Oral Q12H  . mupirocin ointment  1 application Nasal BID  . pantoprazole  40 mg Oral Daily   acetaminophen **OR** acetaminophen, guaiFENesin-dextromethorphan, ipratropium-albuterol, labetalol, ondansetron **OR** ondansetron (ZOFRAN) IV  Assessment/ Plan:  Mr. Mikolaj Woolstenhulme is a 76 y.o. white male with diabetes mellitus type II insulin dependent, hypertension, coronary artery disease, hyperlipidemia, prostate cancer status post prostatectomy, right total knee, appendectomy.   MWF [facility] [place] AVF  1. End Stage Renal Disease MWF:  2.2 L removed with HD yesterday Next HD scheduled for tomorrow  2.  Anemia of chronic kidney disease: hemoglobin 10.2 - Mircera as outpatient.  - EPO while inpatient  3.  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  Continue good control of blood sugars  4. Secondary Hyperparathyroidism:   - calcium acetate with meals  5. Acute respiratory failure Pneumonia: with pleural effusion, not consistent with empyema. Status post right thoracentesis 6/16.  - IV Abx as per ID team  Of BiPAP now      LOS: 5 Aubert Choyce 6/21/20188:54 AM  Late entry

## 2016-09-19 NOTE — Progress Notes (Signed)
PT Cancellation Note  Patient Details Name: Calvin Good MRN: [id number] DOB: 1940/08/27   Cancelled Treatment:    Reason Eval/Treat Not Completed: Patient not medically ready Pt with change in status and has been transferred to CCU.  Pt will need new PT orders when he is appropriate for PT, current orders will be completed. Kreg Shropshire, DPT 09/19/2016, 4:45 PM

## 2016-09-19 NOTE — Progress Notes (Addendum)
No respiratory distress. Comfortable on nasal cannula oxygen. Seems to have expresses aphasia.  Vitals:   09/19/16 0520 09/19/16 0600 09/19/16 0700 09/19/16 0800  BP: (!) 147/66 138/60 (!) 124/49 (!) 152/47  Pulse: (!) 105 94 100 (!) 101  Resp: 10 10 10 12   Temp:    98.4 F (36.9 C)  TempSrc:    Oral  SpO2: (!) 89% 90% 93% 92%  Weight:      Height:        NAD HEENT WNL No JVD No wheezes Regular, no murmurs Abdomen soft, bowel sounds present Extremities warm without edema Cranial nerves intact, moves all extremities, equal strength and DTRs Asterixis present - worse on right than left Difficulty with word finding  BMP Latest Ref Rng & Units 09/19/2016 09/18/2016 09/16/2016  Glucose 65 - 99 mg/dL 148(H) 168(H) 98  BUN 6 - 20 mg/dL 45(H) 62(H) 49(H)  Creatinine 0.61 - 1.24 mg/dL 6.05(H) 7.61(H) 6.16(H)  Sodium 135 - 145 mmol/L 136 130(L) 136  Potassium 3.5 - 5.1 mmol/L 4.1 4.5 3.5  Chloride 101 - 111 mmol/L 97(L) 94(L) 100(L)  CO2 22 - 32 mmol/L 31 26 28   Calcium 8.9 - 10.3 mg/dL 8.6(L) 8.6(L) 8.8(L)      CBC Latest Ref Rng & Units 09/19/2016 09/18/2016 09/16/2016  WBC 3.8 - 10.6 K/uL 8.0 8.2 8.6  Hemoglobin 13.0 - 18.0 g/dL 10.2(L) 8.8(L) 9.3(L)  Hematocrit 40.0 - 52.0 % 30.0(L) 26.5(L) 27.4(L)  Platelets 150 - 440 K/uL 145(L) 146(L) 139(L)   No new chest x-ray  IMPRESSION: Mild chronic hypercarbic respiratory failure Probable pneumonia - antibiotics per ID Apparent expressive aphasia and asterixis of unclear etiology  PLAN/REC: No changes to his medical regimen Continue to monitor in SDU for now I have asked for neurology consultation  Neuro input appreciated  CT head essentially negative  EEG pending Antibiotics per ID service  Merton Border, MD PCCM service Mobile [phone] Pager [phone] 09/19/2016 11:33 AM

## 2016-09-19 NOTE — Progress Notes (Signed)
[facility] INFECTI[facility] PROGRESS NOTE Date of Admission:  09/14/2016     ID: Calvin Good is a 76 y.o. male with HCAP Active Problems:   HCAP (healthcare-associated pneumonia)   Subjective: Remains in ICU, not on pressors, on baseline o2. Still confused  ROS  Unable to obtain   Medications:  Antibiotics Given (last 72 hours)    Date/Time Action Medication Dose Rate   09/16/16 1829 New Bag/Given   ceFEPIme (MAXIPIME) 1 g in dextrose 5 % 50 mL IVPB 1 g 100 mL/hr   09/17/16 1759 New Bag/Given   cefTAZidime (FORTAZ) 1 g in dextrose 5 % 50 mL IVPB 1 g 100 mL/hr   09/18/16 1319 New Bag/Given   vancomycin (VANCOCIN) IVPB 1000 mg/200 mL premix 1,000 mg 200 mL/hr   09/18/16 1449 Given   metroNIDAZOLE (FLAGYL) tablet 500 mg 500 mg    09/18/16 1804 New Bag/Given   ceftAZIDime (FORTAZ) 2 gram/50 mL D5W IVPB - DUPLEX 2 g 100 mL/hr   09/18/16 2146 Given   metroNIDAZOLE (FLAGYL) tablet 500 mg 500 mg    09/19/16 0953 Given   metroNIDAZOLE (FLAGYL) tablet 500 mg 500 mg      . amLODipine  5 mg Oral QPM  . aspirin EC  81 mg Oral Daily  . atorvastatin  40 mg Oral Daily  . benzonatate  100 mg Oral TID  . calcium acetate  1,334 mg Oral TID WC  . carvedilol  6.25 mg Oral BID  . Chlorhexidine Gluconate Cloth  6 each Topical Q0600  . doxazosin  8 mg Oral Daily  . epoetin (EPOGEN/PROCRIT) injection  4,000 Units Intravenous Q M,W,F-HD  . heparin subcutaneous  5,000 Units Subcutaneous Q8H  . insulin aspart  0-5 Units Subcutaneous QHS  . insulin aspart  0-9 Units Subcutaneous TID WC  . insulin detemir  30 Units Subcutaneous QHS  . isosorbide mononitrate  30 mg Oral Daily  . lisinopril  20 mg Oral Daily  . mouth rinse  15 mL Mouth Rinse BID  . metroNIDAZOLE  500 mg Oral Q12H  . mupirocin ointment  1 application Nasal BID  . pantoprazole  40 mg Oral Daily    Objective: Vital signs in last 24 hours: Temp:  [98.4 F (36.9 C)-101.6 F (38.7 C)] 98.4 F (36.9 C) (06/21 0800) Pulse  Rate:  [77-108] 101 (06/21 0800) Resp:  [9-18] 12 (06/21 0800) BP: (116-182)/(35-137) 152/47 (06/21 0800) SpO2:  [89 %-100 %] 92 % (06/21 0800) FiO2 (%):  [28 %] 28 % (06/20 2000) Constitutional: He is confused. Frail appearing HENT: anicteric  Mouth/Throat: Oropharynx is clear and moist. No oropharyngeal exudate.  Cardiovascular: Normal rate, regular rhythm and normal heart sounds.  Pulmonary/Chest: bil rhonchi  Abdominal: Soft. Bowel sounds are normal. He exhibits no distension.   Lymphadenopathy: He has no cervical adenopathy.  Ext LUE avf wnl Neurological: He is alert and oriented to person, place, and time.  Skin: Skin is warm and dry. No rash noted. No erythema.  Psychiatric: He has a normal mood and affect. His behavior is normal.   Lab Results  Recent Labs  09/18/16 1200 09/19/16 0418  WBC 8.2 8.0  HGB 8.8* 10.2*  HCT 26.5* 30.0*  NA 130* 136  K 4.5 4.1  CL 94* 97*  CO2 26 31  BUN 62* 45*  CREATININE 7.61* 6.05*    Microbiology: Results for orders placed or performed during the hospital encounter of 09/14/16  Blood Culture (routine x 2)  Status: Abnormal   Collection Time: 09/14/16  2:53 PM  Result Value Ref Range Status   Specimen Description BLOOD RIGHT AC  Final   Special Requests   Final    BOTTLES DRAWN AEROBIC AND ANAEROBIC Blood Culture adequate volume   Culture  Setup Time   Final    GRAM POSITIVE COCCI AEROBIC BOTTLE ONLY CRITICAL RESULT CALLED TO, READ BACK BY AND VERIFIED WITH: Sheema Hallaji @ 0800 09/15/16 by [facility]    Culture (A)  Final    STAPHYLOCOCCUS SPECIES (COAGULASE NEGATIVE) THE SIGNIFICANCE OF ISOLATING THIS ORGANISM FROM A SINGLE SET OF BLOOD CULTURES WHEN MULTIPLE SETS ARE DRAWN IS UNCERTAIN. PLEASE NOTIFY THE MICROBIOLOGY DEPARTMENT WITHIN ONE WEEK IF SPECIATION AND SENSITIVITIES ARE REQUIRED. Performed at [facility], [place] [address], [place], [place] [address]    Report Status 09/17/2016 FINAL  Final  Blood Culture ID  Panel (Reflexed)     Status: Abnormal   Collection Time: 09/14/16  2:53 PM  Result Value Ref Range Status   Enterococcus species (A) NOT DETECTED Final    THIS TEST WAS ORDERED IN ERROR AND HAS BEEN CREDITED.    Comment: INCORRECT PANEL SET UP. SPECIMEN AGE LIMIT EXCEEDED WHEN ERROR WAS DISCOVERED. CALLED TO CHRISTINE KATSOUDAS 09/16/16 1100. SGD   Listeria monocytogenes TEST WILL BE CREDITED (A) NOT DETECTED Final   Staphylococcus species TEST WILL BE CREDITED (A) NOT DETECTED Final   Staphylococcus aureus TEST WILL BE CREDITED (A) NOT DETECTED Final   Streptococcus species TEST WILL BE CREDITED (A) NOT DETECTED Final   Streptococcus agalactiae TEST WILL BE CREDITED (A) NOT DETECTED Final   Streptococcus pneumoniae TEST WILL BE CREDITED (A) NOT DETECTED Final   Streptococcus pyogenes TEST WILL BE CREDITED (A) NOT DETECTED Final   Acinetobacter baumannii TEST WILL BE CREDITED (A) NOT DETECTED Final   Enterobacteriaceae species TEST WILL BE CREDITED (A) NOT DETECTED Final   Enterobacter cloacae complex TEST WILL BE CREDITED (A) NOT DETECTED Final   Escherichia coli TEST WILL BE CREDITED (A) NOT DETECTED Final   Klebsiella oxytoca TEST WILL BE CREDITED (A) NOT DETECTED Final   Klebsiella pneumoniae TEST WILL BE CREDITED (A) NOT DETECTED Final   Proteus species TEST WILL BE CREDITED (A) NOT DETECTED Final   Serratia marcescens TEST WILL BE CREDITED (A) NOT DETECTED Final   Haemophilus influenzae TEST WILL BE CREDITED (A) NOT DETECTED Final   Neisseria meningitidis TEST WILL BE CREDITED (A) NOT DETECTED Final   Pseudomonas aeruginosa TEST WILL BE CREDITED (A) NOT DETECTED Final   Candida albicans TEST WILL BE CREDITED (A) NOT DETECTED Final   Candida glabrata TEST WILL BE CREDITED (A) NOT DETECTED Final   Candida krusei TEST WILL BE CREDITED (A) NOT DETECTED Final   Candida parapsilosis TEST WILL BE CREDITED (A) NOT DETECTED Final   Candida tropicalis TEST WILL BE CREDITED (A) NOT DETECTED  Final  Blood Culture (routine x 2)     Status: Abnormal   Collection Time: 09/14/16  2:58 PM  Result Value Ref Range Status   Specimen Description BLOOD BLOOD LEFT HAND  Final   Special Requests   Final    BOTTLES DRAWN AEROBIC AND ANAEROBIC Blood Culture adequate volume   Culture  Setup Time   Final    GRAM POSITIVE RODS AEROBIC BOTTLE ONLY CRITICAL RESULT CALLED TO, READ BACK BY AND VERIFIED WITH: MATT MCBANE AT 9485 ON 09/15/16 RWW CONFIRMED BY [facility]    Culture (A)  Final  BACILLUS SPECIES Standardized susceptibility testing for this organism is not available. Performed at [facility], [place] [address], [place], [place] [address]    Report Status 09/17/2016 FINAL  Final  Blood Culture ID Panel (Reflexed)     Status: None   Collection Time: 09/14/16  2:58 PM  Result Value Ref Range Status   Enterococcus species NOT DETECTED NOT DETECTED Final   Listeria monocytogenes NOT DETECTED NOT DETECTED Final   Staphylococcus species NOT DETECTED NOT DETECTED Final   Staphylococcus aureus NOT DETECTED NOT DETECTED Final   Streptococcus species NOT DETECTED NOT DETECTED Final   Streptococcus agalactiae NOT DETECTED NOT DETECTED Final   Streptococcus pneumoniae NOT DETECTED NOT DETECTED Final   Streptococcus pyogenes NOT DETECTED NOT DETECTED Final   Acinetobacter baumannii NOT DETECTED NOT DETECTED Final   Enterobacteriaceae species NOT DETECTED NOT DETECTED Final   Enterobacter cloacae complex NOT DETECTED NOT DETECTED Final   Escherichia coli NOT DETECTED NOT DETECTED Final   Klebsiella oxytoca NOT DETECTED NOT DETECTED Final   Klebsiella pneumoniae NOT DETECTED NOT DETECTED Final   Proteus species NOT DETECTED NOT DETECTED Final   Serratia marcescens NOT DETECTED NOT DETECTED Final   Haemophilus influenzae NOT DETECTED NOT DETECTED Final   Neisseria meningitidis NOT DETECTED NOT DETECTED Final   Pseudomonas aeruginosa NOT DETECTED NOT DETECTED Final   Candida albicans NOT  DETECTED NOT DETECTED Final   Candida glabrata NOT DETECTED NOT DETECTED Final   Candida krusei NOT DETECTED NOT DETECTED Final   Candida parapsilosis NOT DETECTED NOT DETECTED Final   Candida tropicalis NOT DETECTED NOT DETECTED Final  Body fluid culture (includes gram stain)     Status: None   Collection Time: 09/14/16  3:47 PM  Result Value Ref Range Status   Specimen Description PLEURAL  Final   Special Requests PLEURAL  Final   Gram Stain   Final    RARE WBC PRESENT, PREDOMINANTLY MONONUCLEAR NO ORGANISMS SEEN    Culture   Final    NO GROWTH 3 DAYS Performed at [facility], [address], [place], [place] [address]    Report Status 09/18/2016 FINAL  Final  Culture, sputum-assessment     Status: None   Collection Time: 09/14/16  6:32 PM  Result Value Ref Range Status   Specimen Description SPUTUM  Final   Special Requests Immunocompromised  Final   Sputum evaluation THIS SPECIMEN IS ACCEPTABLE FOR SPUTUM CULTURE  Final   Report Status 09/14/2016 FINAL  Final  Culture, respiratory (NON-Expectorated)     Status: None   Collection Time: 09/14/16  6:32 PM  Result Value Ref Range Status   Specimen Description SPUTUM  Final   Special Requests Immunocompromised Reflexed from [id number]  Final   Gram Stain   Final    FEW WBC PRESENT,BOTH PMN AND MONONUCLEAR FEW GRAM NEGATIVE RODS FEW GRAM NEGATIVE COCCI IN PAIRS    Culture   Final    Consistent with normal respiratory flora. Performed at [facility], [place] [address], [place], [place] [address]    Report Status 09/17/2016 FINAL  Final  MRSA PCR Screening     Status: Abnormal   Collection Time: 09/15/16  8:30 PM  Result Value Ref Range Status   MRSA by PCR POSITIVE (A) NEGATIVE Corrected    Comment: MARSHA HATCH  AT 2155 ON 09/15/16 RWW        The GeneXpert MRSA Assay (FDA approved for NASAL specimens only), is one component of  a comprehensive MRSA colonization surveillance program. It is not intended to  diagnose MRSA infection nor to guide or monitor treatment for MRSA infections. CORRECTED ON 06/18 AT 0544: PREVIOUSLY REPORTED AS POSITIVE        The GeneXpert MRSA Assay (FDA approved for NASAL specimens only), is one component of a comprehensive MRSA colonization surveillance program. It is not intended to diagnose MRSA infection  nor to guide or monitor treatment for MRSA infections. RESULT CALLED TO, READ BACK BY AND VERIFIED WITH: [facility] AT 2155 ON 09/15/16 RWW     Studies/Results: Dg Chest Port 1 View  Result Date: 09/18/2016 CLINICAL DATA:  Pt's oxygen decreased. Pt is SOB. Hx of CHF, COPD, Diabetes and HTN. Hx of dialysis catheter. EXAM: PORTABLE CHEST - 1 VIEW COMPARISON:  09/17/2016 FINDINGS: Improvement in the patchy right lower lung airspace disease. Interstitial opacities in both lung bases are stable. No new infiltrate or overt edema. Heart size appears mildly enlarged for technique. Atheromatous aorta. No effusion.  No pneumothorax. Visualized bones unremarkable. IMPRESSION: Improving right lower lobe infiltrate. Aortic Atherosclerosis (ICD10-170.0) Electronically Signed   By: Lucrezia Europe M.D.   On: 09/18/2016 08:21    Assessment/Plan: Calvin Good is a 76 y.o. male with ESRD on HD admitted with SOB and found to have bil pleural effusions and infiltrate. BCX + bacillus x1 and CNS x 1.  Pleural fluid studies do not suggest infection and cx negative. Was clinically improving but declined due to resp distress and in Unit now.  Febrile at time of transfer as well.   Recommendations Cont  treatment for HCAP with vanco and ceftazidime. These can be given at HD. Rec a 10 day course Pending sputum cx. Since he is febrile and confused now I am concerned for aspiration will continue flagyl. Thank you very much for the consult. Will follow with you.  Calvin Good P   09/19/2016, 12:04 PM

## 2016-09-19 NOTE — Consult Note (Signed)
Reason for Consult:AMS Referring Physician: Alva Garnet  CC: AMS  HPI: Calvin Good is an 76 y.o. male who is altered and is therefore unable to provide any history.  No family available therefore all history obtained from the chart.   Patient with multiple medical problems.  Presented to the ED with SOB.  CXR suggested PNA and pleural effusions.  A pleural effusion was treated and antibiotics treated.  Despite treatment cognitively the patient has declined.  Has been unable to speak coherently.  Consult called fir further recommendations.    Past Medical History:  Diagnosis Date  . Cancer [facility])    Prostate  . CHF (congestive heart failure) ([place])   . Chronic kidney disease   . Complication of anesthesia    hard to wake up  . COPD (chronic obstructive pulmonary disease) ([place])   . Diabetes mellitus without complication ([place])   . Difficult intubation   . Dyspnea   . GERD (gastroesophageal reflux disease)   . Hyperlipidemia   . Hypertension     Past Surgical History:  Procedure Laterality Date  . APPENDECTOMY    . AV FISTULA PLACEMENT Left 05/30/2016   Procedure: ARTERIOVENOUS (AV) FISTULA CREATION ( BRACHIOCEPHALIC );  Surgeon: [name], MD;  Location: [facility] ORS;  Service: Vascular;  Laterality: Left;  . CAPD INSERTION N/A 05/30/2016   Procedure: LAPAROSCOPIC INSERTION CONTINUOUS AMBULATORY PERITONEAL DIALYSIS  (CAPD) CATHETER;  Surgeon: [name], MD;  Location: [facility] ORS;  Service: Vascular;  Laterality: N/A;  . DIALYSIS/PERMA CATHETER INSERTION    . DIALYSIS/PERMA CATHETER REMOVAL N/A 08/21/2016   Procedure: Dialysis/Perma Catheter Removal;  Surgeon: [name], MD;  Location: [place] [facility];  Service: Cardiovascular;  Laterality: N/A;  . JOINT REPLACEMENT     right knee   . LEFT HEART CATH AND CORONARY ANGIOGRAPHY N/A 08/21/2016   Procedure: Left Heart Cath and Coronary Angiography possible PCI;  Surgeon: [name], MD;  Location: [place] [facility];  Service:  Cardiovascular;  Laterality: N/A;  . postate removal     . PROSTATE SURGERY    . PSEUDOANERYSM COMPRESSION Right 08/27/2016   Procedure: Pseudoanerysm Compression;  Surgeon: Katha Cabal, MD;  Location: [place] [facility];  Service: Cardiovascular;  Laterality: Right;  . REMOVAL OF A DIALYSIS CATHETER N/A 08/08/2016   Procedure: REMOVAL OF A DIALYSIS CATHETER;  Surgeon: [name], MD;  Location: [facility] ORS;  Service: Vascular;  Laterality: N/A;    Family History  Problem Relation Age of Onset  . Cancer Father   . CAD Neg Hx   . Diabetes Neg Hx     Social History:  reports that he quit smoking about 34 years ago. He has never used smokeless tobacco. He reports that he drinks about 1.8 oz of alcohol per week . He reports that he does not use drugs.  Allergies  Allergen Reactions  . Tetracyclines & Related Other (See Comments)    Reaction: unknown happened a long time ago and pt doesn't remember reaction   . Morphine Other (See Comments)    Reaction: confusion    Medications:  I have reviewed the patient's current medications. Prior to Admission:  Prescriptions Prior to Admission  Medication Sig Dispense Refill Last Dose  . amLODipine (NORVASC) 5 MG tablet Take 5 mg by mouth every evening.    09/13/2016 at pm  . aspirin 81 MG EC tablet Chew 81 mg by mouth daily.    09/14/2016 at am  . atorvastatin (  LIPITOR) 20 MG tablet Take 2 tablets (40 mg total) by mouth daily. (Patient taking differently: Take 20 mg by mouth daily. ) 30 tablet 0 09/14/2016 at am  . calcium acetate (PHOSLO) 667 MG capsule Take 1,334 mg by mouth 3 (three) times daily with meals.    09/14/2016 at am  . carvedilol (COREG) 6.25 MG tablet Take 6.25 mg by mouth 2 (two) times daily.   09/14/2016 at am  . doxazosin (CARDURA) 8 MG tablet Take 8 mg by mouth daily.   09/14/2016 at am  . furosemide (LASIX) 40 MG tablet Take 40 mg by mouth daily.    09/14/2016 at am  . insulin lispro (HUMALOG) 100 UNIT/ML injection Inject  10 Units into the skin 3 (three) times daily before meals.    09/14/2016 at am  . insulin NPH Human (HUMULIN N,NOVOLIN N) 100 UNIT/ML injection Inject 65 Units into the skin 2 (two) times daily before a meal.    Taking  . isosorbide mononitrate (IMDUR) 30 MG 24 hr tablet Take 1 tablet (30 mg total) by mouth daily. 60 tablet 0 09/14/2016 at am  . lidocaine-prilocaine (EMLA) cream Apply 1 application topically as needed.   prn at prn  . lisinopril (PRINIVIL,ZESTRIL) 20 MG tablet Take 20 mg by mouth daily.    09/14/2016 at am  . omeprazole (PRILOSEC) 20 MG capsule Take 20 mg by mouth 2 (two) times daily before a meal.   09/14/2016 at am  . spironolactone (ALDACTONE) 25 MG tablet Take 25 mg by mouth daily.   09/14/2016 at am   Scheduled: . amLODipine  5 mg Oral QPM  . aspirin EC  81 mg Oral Daily  . atorvastatin  40 mg Oral Daily  . benzonatate  100 mg Oral TID  . calcium acetate  1,334 mg Oral TID WC  . carvedilol  6.25 mg Oral BID  . Chlorhexidine Gluconate Cloth  6 each Topical Q0600  . doxazosin  8 mg Oral Daily  . epoetin (EPOGEN/PROCRIT) injection  4,000 Units Intravenous Q M,W,F-HD  . heparin subcutaneous  5,000 Units Subcutaneous Q8H  . insulin aspart  0-5 Units Subcutaneous QHS  . insulin aspart  0-9 Units Subcutaneous TID WC  . insulin detemir  30 Units Subcutaneous QHS  . isosorbide mononitrate  30 mg Oral Daily  . lisinopril  20 mg Oral Daily  . mouth rinse  15 mL Mouth Rinse BID  . metroNIDAZOLE  500 mg Oral Q12H  . mupirocin ointment  1 application Nasal BID  . pantoprazole  40 mg Oral Daily    ROS: History obtained from chart review  General ROS: negative for - chills, fatigue, fever, night sweats, weight gain or weight loss Psychological ROS: memory difficulties Ophthalmic ROS: negative for - blurry vision, double vision, eye pain or loss of vision ENT ROS: negative for - epistaxis, nasal discharge, oral lesions, sore throat, tinnitus or vertigo Allergy and Immunology ROS:  negative for - hives or itchy/watery eyes Hematological and Lymphatic ROS: negative for - bleeding problems, bruising or swollen lymph nodes Endocrine ROS: negative for - galactorrhea, hair pattern changes, polydipsia/polyuria or temperature intolerance Respiratory ROS: shortness of breath Cardiovascular ROS: negative for - chest pain, dyspnea on exertion, edema or irregular heartbeat Gastrointestinal ROS: negative for - abdominal pain, diarrhea, hematemesis, nausea/vomiting or stool incontinence Genito-Urinary ROS: negative for - dysuria, hematuria, incontinence or urinary frequency/urgency Musculoskeletal ROS: negative for - joint swelling or muscular weakness Neurological ROS: as noted in HPI Dermatological ROS: negative  for rash and skin lesion changes  Physical Examination: Blood pressure (!) 119/36, pulse 86, temperature 99.2 F (37.3 C), temperature source Axillary, resp. rate 10, height 5\' 9"  (1.753 m), weight 105 kg (231 lb 7.7 oz), SpO2 96 %.  HEENT-  Normocephalic, no lesions, without obvious abnormality.  Normal external eye and conjunctiva.  Normal TM's bilaterally.  Normal auditory canals and external ears. Normal external nose, mucus membranes and septum.  Normal pharynx. Cardiovascular- S1, S2 normal, pulses palpable throughout   Lungs- chest clear, no wheezing, rales, normal symmetric air entry Abdomen- soft, non-tender; bowel sounds normal; no masses,  no organomegaly Extremities- no edema Lymph-no adenopathy palpable Musculoskeletal-no joint tenderness, deformity or swelling Skin-warm and dry, no hyperpigmentation, vitiligo, or suspicious lesions  Neurological Examination   Mental Status: Lethargic.  Inattentive.  Oriented to name only.  Perseverates.  Does not know why he is un the hospital.  Speech fluent but slurred.  Able to follow only some simple commands. Cranial Nerves: II: Discs flat bilaterally; Blinks to bilateral confrontation, pupils equal, round, reactive  to light and accommodation III,IV, VI: ptosis not present, extra-ocular motions intact bilaterally V,VII: smile symmetric, facial light touch sensation normal bilaterally VIII: hearing normal bilaterally IX,X: gag reflex present XI: bilateral shoulder shrug XII: midline tongue extension Motor: Patient moves both upper extremities against gravity with no focal weakness noted.  The patient does not move the lower extremities off the bed, with the strength noted at 1-2/5.  Generalized myoclonus noted.   Sensory: Patient does not respond to noxious stimuli Deep Tendon Reflexes: 2+ throughout with absent AJ's bilaterally Plantars: Right: downgoing   Left: mute Cerebellar: Unable to perform finger-to-nose and heel-to-shin testing due to mental status Gait: not able to perform secondary mental status changes      Laboratory Studies:   Basic Metabolic Panel:  Recent Labs Lab 09/14/16 1249 09/15/16 0510 09/16/16 0450 09/18/16 1200 09/19/16 0418  NA 134* 135 136 130* 136  K 3.9 3.5 3.5 4.5 4.1  CL 96* 99* 100* 94* 97*  CO2 28 28 28 26 31   GLUCOSE 280* 260* 98 168* 148*  BUN 27* 39* 49* 62* 45*  CREATININE 3.98* 4.81* 6.16* 7.61* 6.05*  CALCIUM 8.9 8.6* 8.8* 8.6* 8.6*  PHOS  --   --   --  4.8*  --     Liver Function Tests:  Recent Labs Lab 09/15/16 0510 09/18/16 1200 09/19/16 0418  AST 13*  --  11*  ALT 13*  --  11*  ALKPHOS 54  --  43  BILITOT 0.9  --  0.8  PROT 6.5  --  7.0  ALBUMIN 3.4* 3.3* 3.3*   No results for input(s): LIPASE, AMYLASE in the last 168 hours. No results for input(s): AMMONIA in the last 168 hours.  CBC:  Recent Labs Lab 09/14/16 1249 09/15/16 0510 09/16/16 0450 09/18/16 1200 09/19/16 0418  WBC 8.2 10.5 8.6 8.2 8.0  NEUTROABS 5.8  --   --   --   --   HGB 10.3* 9.9* 9.3* 8.8* 10.2*  HCT 29.4* 29.0* 27.4* 26.5* 30.0*  MCV 86.9 87.0 87.6 88.8 89.2  PLT 175 160 139* 146* 145*    Cardiac Enzymes:  Recent Labs Lab 09/14/16 1249   TROPONINI <0.03    BNP: Invalid input(s): POCBNP  CBG:  Recent Labs Lab 09/18/16 0957 09/18/16 1647 09/18/16 2138 09/19/16 0721 09/19/16 1154  GLUCAP 191* 181* 178* 186* 218*    Microbiology: Results for orders placed or performed  during the hospital encounter of 09/14/16  Blood Culture (routine x 2)     Status: Abnormal   Collection Time: 09/14/16  2:53 PM  Result Value Ref Range Status   Specimen Description BLOOD RIGHT [facility]  Final   Special Requests   Final    BOTTLES DRAWN AEROBIC AND ANAEROBIC Blood Culture adequate volume   Culture  Setup Time   Final    GRAM POSITIVE COCCI AEROBIC BOTTLE ONLY CRITICAL RESULT CALLED TO, READ BACK BY AND VERIFIED WITH: Sheema Hallaji @ 0800 09/15/16 by [facility]    Culture (A)  Final    STAPHYLOCOCCUS SPECIES (COAGULASE NEGATIVE) THE SIGNIFICANCE OF ISOLATING THIS ORGANISM FROM A SINGLE SET OF BLOOD CULTURES WHEN MULTIPLE SETS ARE DRAWN IS UNCERTAIN. PLEASE NOTIFY THE MICROBIOLOGY DEPARTMENT WITHIN ONE WEEK IF SPECIATION AND SENSITIVITIES ARE REQUIRED. Performed at [facility], [place] [address] [place], [place] [address]    Report Status 09/17/2016 FINAL  Final  Blood Culture ID Panel (Reflexed)     Status: Abnormal   Collection Time: 09/14/16  2:53 PM  Result Value Ref Range Status   Enterococcus species (A) NOT DETECTED Final    THIS TEST WAS ORDERED IN ERROR AND HAS BEEN CREDITED.    Comment: INCORRECT PANEL SET UP. SPECIMEN AGE LIMIT EXCEEDED WHEN ERROR WAS DISCOVERED. CALLED TO CHRISTINE KATSOUDAS 09/16/16 1100. SGD   Listeria monocytogenes TEST WILL BE CREDITED (A) NOT DETECTED Final   Staphylococcus species TEST WILL BE CREDITED (A) NOT DETECTED Final   Staphylococcus aureus TEST WILL BE CREDITED (A) NOT DETECTED Final   Streptococcus species TEST WILL BE CREDITED (A) NOT DETECTED Final   Streptococcus agalactiae TEST WILL BE CREDITED (A) NOT DETECTED Final   Streptococcus pneumoniae TEST WILL BE CREDITED (A) NOT DETECTED  Final   Streptococcus pyogenes TEST WILL BE CREDITED (A) NOT DETECTED Final   Acinetobacter baumannii TEST WILL BE CREDITED (A) NOT DETECTED Final   Enterobacteriaceae species TEST WILL BE CREDITED (A) NOT DETECTED Final   Enterobacter cloacae complex TEST WILL BE CREDITED (A) NOT DETECTED Final   Escherichia coli TEST WILL BE CREDITED (A) NOT DETECTED Final   Klebsiella oxytoca TEST WILL BE CREDITED (A) NOT DETECTED Final   Klebsiella pneumoniae TEST WILL BE CREDITED (A) NOT DETECTED Final   Proteus species TEST WILL BE CREDITED (A) NOT DETECTED Final   Serratia marcescens TEST WILL BE CREDITED (A) NOT DETECTED Final   Haemophilus influenzae TEST WILL BE CREDITED (A) NOT DETECTED Final   Neisseria meningitidis TEST WILL BE CREDITED (A) NOT DETECTED Final   Pseudomonas aeruginosa TEST WILL BE CREDITED (A) NOT DETECTED Final   Candida albicans TEST WILL BE CREDITED (A) NOT DETECTED Final   Candida glabrata TEST WILL BE CREDITED (A) NOT DETECTED Final   Candida krusei TEST WILL BE CREDITED (A) NOT DETECTED Final   Candida parapsilosis TEST WILL BE CREDITED (A) NOT DETECTED Final   Candida tropicalis TEST WILL BE CREDITED (A) NOT DETECTED Final  Blood Culture (routine x 2)     Status: Abnormal   Collection Time: 09/14/16  2:58 PM  Result Value Ref Range Status   Specimen Description BLOOD BLOOD LEFT HAND  Final   Special Requests   Final    BOTTLES DRAWN AEROBIC AND ANAEROBIC Blood Culture adequate volume   Culture  Setup Time   Final    GRAM POSITIVE RODS AEROBIC BOTTLE ONLY CRITICAL RESULT CALLED TO, READ BACK BY AND VERIFIED WITH: MATT MCBANE AT 2706  ON 09/15/16 RWW CONFIRMED BY [facility]    Culture (A)  Final    BACILLUS SPECIES Standardized susceptibility testing for this organism is not available. Performed at [facility], [place] [address], [place], [place] [address]    Report Status 09/17/2016 FINAL  Final  Blood Culture ID Panel (Reflexed)     Status: None   Collection Time:  09/14/16  2:58 PM  Result Value Ref Range Status   Enterococcus species NOT DETECTED NOT DETECTED Final   Listeria monocytogenes NOT DETECTED NOT DETECTED Final   Staphylococcus species NOT DETECTED NOT DETECTED Final   Staphylococcus aureus NOT DETECTED NOT DETECTED Final   Streptococcus species NOT DETECTED NOT DETECTED Final   Streptococcus agalactiae NOT DETECTED NOT DETECTED Final   Streptococcus pneumoniae NOT DETECTED NOT DETECTED Final   Streptococcus pyogenes NOT DETECTED NOT DETECTED Final   Acinetobacter baumannii NOT DETECTED NOT DETECTED Final   Enterobacteriaceae species NOT DETECTED NOT DETECTED Final   Enterobacter cloacae complex NOT DETECTED NOT DETECTED Final   Escherichia coli NOT DETECTED NOT DETECTED Final   Klebsiella oxytoca NOT DETECTED NOT DETECTED Final   Klebsiella pneumoniae NOT DETECTED NOT DETECTED Final   Proteus species NOT DETECTED NOT DETECTED Final   Serratia marcescens NOT DETECTED NOT DETECTED Final   Haemophilus influenzae NOT DETECTED NOT DETECTED Final   Neisseria meningitidis NOT DETECTED NOT DETECTED Final   Pseudomonas aeruginosa NOT DETECTED NOT DETECTED Final   Candida albicans NOT DETECTED NOT DETECTED Final   Candida glabrata NOT DETECTED NOT DETECTED Final   Candida krusei NOT DETECTED NOT DETECTED Final   Candida parapsilosis NOT DETECTED NOT DETECTED Final   Candida tropicalis NOT DETECTED NOT DETECTED Final  Body fluid culture (includes gram stain)     Status: None   Collection Time: 09/14/16  3:47 PM  Result Value Ref Range Status   Specimen Description PLEURAL  Final   Special Requests PLEURAL  Final   Gram Stain   Final    RARE WBC PRESENT, PREDOMINANTLY MONONUCLEAR NO ORGANISMS SEEN    Culture   Final    NO GROWTH 3 DAYS Performed at [facility], [address], [place], [place] [address]    Report Status 09/18/2016 FINAL  Final  Culture, sputum-assessment     Status: None   Collection Time: 09/14/16  6:32 PM   Result Value Ref Range Status   Specimen Description SPUTUM  Final   Special Requests Immunocompromised  Final   Sputum evaluation THIS SPECIMEN IS ACCEPTABLE FOR SPUTUM CULTURE  Final   Report Status 09/14/2016 FINAL  Final  Culture, respiratory (NON-Expectorated)     Status: None   Collection Time: 09/14/16  6:32 PM  Result Value Ref Range Status   Specimen Description SPUTUM  Final   Special Requests Immunocompromised Reflexed from [id number]  Final   Gram Stain   Final    FEW WBC PRESENT,BOTH PMN AND MONONUCLEAR FEW GRAM NEGATIVE RODS FEW GRAM NEGATIVE COCCI IN PAIRS    Culture   Final    Consistent with normal respiratory flora. Performed at [facility], [place] [address], [place], [place] [address]    Report Status 09/17/2016 FINAL  Final  MRSA PCR Screening     Status: Abnormal   Collection Time: 09/15/16  8:30 PM  Result Value Ref Range Status   MRSA by PCR POSITIVE (A) NEGATIVE Corrected    Comment: MARSHA HATCH  AT 2155 ON 09/15/16 RWW  The GeneXpert MRSA Assay (FDA approved for NASAL specimens only), is one component of a comprehensive MRSA colonization surveillance program. It is not intended to diagnose MRSA infection nor to guide or monitor treatment for MRSA infections. CORRECTED ON 06/18 AT 0544: PREVIOUSLY REPORTED AS POSITIVE        The GeneXpert MRSA Assay (FDA approved for NASAL specimens only), is one component of a comprehensive MRSA colonization surveillance program. It is not intended to diagnose MRSA infection  nor to guide or monitor treatment for MRSA infections. RESULT CALLED TO, READ BACK BY AND VERIFIED WITH: MARSHAATCH AT 2155 ON 09/15/16 RWW     Coagulation Studies: No results for input(s): LABPROT, INR in the last 72 hours.  Urinalysis: No results for input(s): COLORURINE, LABSPEC, PHURINE, GLUCOSEU, HGBUR, BILIRUBINUR, KETONESUR, PROTEINUR, UROBILINOGEN, NITRITE, LEUKOCYTESUR in the last 168 hours.  Invalid input(s):  APPERANCEUR  Lipid Panel:  No results found for: CHOL, TRIG, HDL, CHOLHDL, VLDL, LDLCALC  HgbA1C: No results found for: HGBA1C  Urine Drug Screen:  No results found for: LABOPIA, COCAINSCRNUR, LABBENZ, AMPHETMU, THCU, LABBARB  Alcohol Level: No results for input(s): ETH in the last 168 hours.  Other results: EKG: sinus rhythm at 73 bpm  Imaging: Ct Head Wo Contrast  Result Date: 09/19/2016 CLINICAL DATA:  Altered mental status.  Hypoxia. EXAM: CT HEAD WITHOUT CONTRAST TECHNIQUE: Contiguous axial images were obtained from the base of the skull through the vertex without intravenous contrast. COMPARISON:  None. FINDINGS: Brain: There is mild diffuse atrophy. There is no intracranial mass, hemorrhage, extra-axial fluid collection, or midline shift. There is mild small vessel disease in the centra semiovale bilaterally. Elsewhere gray-white compartments appear normal. No evident acute infarct. Vascular: There is no appreciable hyperdense vessel. There is calcification in carotid siphon. Skull: The bony calvarium appears intact. Sinuses/Orbits: There is mucosal thickening in several ethmoid air cells bilaterally. Other visualized paranasal sinuses are clear. Orbits bilaterally appear symmetric. Other: Visualized mastoid air cells are clear. IMPRESSION: Mild atrophy with mild periventricular small vessel disease. No intracranial mass hemorrhage, or extra-axial fluid collection. No acute infarct evident. Foci of arterial vascular calcification noted. Mild ethmoid sinus disease. Electronically Signed   By: Lowella Grip III M.D.   On: 09/19/2016 12:28   Dg Chest Port 1 View  Result Date: 09/18/2016 CLINICAL DATA:  Pt's oxygen decreased. Pt is SOB. Hx of CHF, COPD, Diabetes and HTN. Hx of dialysis catheter. EXAM: PORTABLE CHEST - 1 VIEW COMPARISON:  09/17/2016 FINDINGS: Improvement in the patchy right lower lung airspace disease. Interstitial opacities in both lung bases are stable. No new infiltrate  or overt edema. Heart size appears mildly enlarged for technique. Atheromatous aorta. No effusion.  No pneumothorax. Visualized bones unremarkable. IMPRESSION: Improving right lower lobe infiltrate. Aortic Atherosclerosis (ICD10-170.0) Electronically Signed   By: Lucrezia Europe M.D.   On: 09/18/2016 08:21     Assessment/Plan: 76 year old male with multiple medical problems who now has altered mental status and myoclonus.  Likely metabolic in etiology.  Renal function worse than baseline.  Patient has been hypoxic as well.  Will rule out other etiologies.  Recommendations: 1.  EEG 2.  Head CT without contrast 3.  Agree with continuing to address medical issues.    Alexis Goodell, MD Neurology [phone] 09/19/2016, 1:47 PM   Addendum: EEG only significant for slowing and triphasic activity consistent with a metabolic encephalopathy.  Head CT reviewed and shows no acute changes.  Will re-evaluate in AM and determine if further work  up indicated.  Alexis Goodell, MD Neurology [phone]

## 2016-09-19 NOTE — Consult Note (Signed)
Pharmacy Antibiotic Note  Calvin Good is a 76 y.o. male admitted on 09/14/2016 with pneumonia.  Pharmacy has been consulted for vancomycin and ceftazidime dosing.  Plan: Continue vancomycin 1g q HD (MWF) Random vancomycin level scheduled with the third dialysis session (6/22) goal pre-HD vancomycin level= 15-25 mcg/ml Continue ceftazidime  2g q HD (MWF)  Height: 5\' 9"  (175.3 cm) Weight: 231 lb 7.7 oz (105 kg) IBW/kg (Calculated) : 70.7  Temp (24hrs), Avg:99.3 F (37.4 C), Min:98.4 F (36.9 C), Max:101.6 F (38.7 C)   Recent Labs Lab 09/14/16 1249 09/15/16 0510 09/16/16 0450 09/18/16 1200 09/19/16 0418  WBC 8.2 10.5 8.6 8.2 8.0  CREATININE 3.98* 4.81* 6.16* 7.61* 6.05*    Estimated Creatinine Clearance: 12 mL/min (A) (by C-G formula based on SCr of 6.05 mg/dL (H)).    Allergies  Allergen Reactions  . Tetracyclines & Related Other (See Comments)    Reaction: unknown happened a long time ago and pt doesn't remember reaction   . Morphine Other (See Comments)    Reaction: confusion    Antimicrobials this admission: 6/16 vancomycin  >>  6/16 Cefepime >> 6/19 6/19 Ceftazidime >> Metronidazole 6/21 >>   Dose adjustments this admission:   Microbiology results: 6/16 BCx: bacillus species/CNS/NGTD 6/16 Sputum: normal flora  6/17 MRSA PCR: positive  Thank you for allowing pharmacy to be a part of this patient's care.  MLS 09/19/2016 3:04 PM

## 2016-09-20 LAB — GLUCOSE, CAPILLARY
GLUCOSE-CAPILLARY: 154 mg/dL — AB (ref 65–99)
GLUCOSE-CAPILLARY: 277 mg/dL — AB (ref 65–99)
Glucose-Capillary: 150 mg/dL — ABNORMAL HIGH (ref 65–99)
Glucose-Capillary: 199 mg/dL — ABNORMAL HIGH (ref 65–99)

## 2016-09-20 LAB — VANCOMYCIN, RANDOM: VANCOMYCIN RM: 14

## 2016-09-20 MED ORDER — METRONIDAZOLE 500 MG PO TABS
500.0000 mg | ORAL_TABLET | Freq: Two times a day (BID) | ORAL | Status: DC
  Administered 2016-09-20 – 2016-09-23 (×6): 500 mg via ORAL
  Filled 2016-09-20 (×7): qty 1

## 2016-09-20 MED ORDER — CEFTAZIDIME AND DEXTROSE 2 GM/50ML IV SOLR
2.0000 g | INTRAVENOUS | Status: DC
  Administered 2016-09-20 – 2016-09-23 (×2): 2 g via INTRAVENOUS
  Filled 2016-09-20 (×3): qty 50

## 2016-09-20 MED ORDER — TRAZODONE HCL 50 MG PO TABS
25.0000 mg | ORAL_TABLET | Freq: Once | ORAL | Status: AC
  Administered 2016-09-20: 25 mg via ORAL
  Filled 2016-09-20: qty 1

## 2016-09-20 MED ORDER — VANCOMYCIN HCL IN DEXTROSE 1-5 GM/200ML-% IV SOLN: 1000.0000 mg | INTRAVENOUS | Status: DC

## 2016-09-20 NOTE — Progress Notes (Signed)
[name] [facility] [place] NAME: Calvin Good    MR#:  [id number]  DATE OF BIRTH:  29-Oct-1940  SUBJECTIVE:  CHIEF COMPLAINT:   Chief Complaint  Patient presents with  . Shortness of Breath   - Dialysis patient admitted with shortness of breath and noted to have pleural effusion, that was drained  in the emergency room - thoracentesis.  Patient was seen and examined at dialysis unit. More awake and alert answering questions  REVIEW OF SYSTEMS:  Review of Systems  Constitutional: Negative for diaphoresis and malaise/fatigue.  HENT: Negative for congestion, ear discharge, hearing loss and sinus pain.   Eyes: Negative for blurred vision and double vision.  Respiratory: Negative for cough and shortness of breath.   Cardiovascular: Negative for chest pain and palpitations.  Gastrointestinal: Negative for abdominal pain, diarrhea, nausea and vomiting.  Genitourinary: Negative for frequency and urgency.  Skin: Negative for itching and rash.  Neurological: Positive for weakness. Negative for dizziness and headaches.  Psychiatric/Behavioral: Negative for depression and hallucinations.    DRUG ALLERGIES:   Allergies  Allergen Reactions  . Tetracyclines & Related Other (See Comments)    Reaction: unknown happened a long time ago and pt doesn't remember reaction   . Morphine Other (See Comments)    Reaction: confusion    VITALS:  Blood pressure 127/60, pulse 79, temperature 98.2 F (36.8 C), temperature source Oral, resp. rate 11, height 5\' 9"  (1.753 m), weight 104.8 kg (231 lb 0.7 oz), SpO2 93 %.  PHYSICAL EXAMINATION:  Physical Exam  GENERAL:  76 y.o.-year-old patient lying in the bed. Looks critically ill EYES: Pupils equal, round, reactive to light and accommodation. No scleral icterus. Extraocular muscles intact.  HEENT: Head atraumatic, normocephalic. Oropharynx and nasopharynx clear.  NECK:  Supple, no jugular venous distention. No  thyroid enlargement, no tenderness.  LUNGS: Bibasilar crackles R>L CARDIOVASCULAR: S1, S2 normal. No  rubs, or gallops. 2/6 systolic murmur present ABDOMEN: Soft, nontender, nondistended. Bowel sounds present. No organomegaly or mass.  EXTREMITIES: No pedal edema, cyanosis, or clubbing. Left arm AV fistula NEUROLOGIC: The patient is Awake and alert and oriented 3 Muscle strength 4/5 in all extremities.  Gait not checked.  PSYCHIATRIC: The patient is awake and alert SKIN: No obvious rash, lesion, or ulcer.    LABORATORY PANEL:   CBC  Recent Labs Lab 09/19/16 0418  WBC 8.0  HGB 10.2*  HCT 30.0*  PLT 145*   ------------------------------------------------------------------------------------------------------------------  Chemistries   Recent Labs Lab 09/19/16 0418  NA 136  K 4.1  CL 97*  CO2 31  GLUCOSE 148*  BUN 45*  CREATININE 6.05*  CALCIUM 8.6*  AST 11*  ALT 11*  ALKPHOS 43  BILITOT 0.8   ------------------------------------------------------------------------------------------------------------------  Cardiac Enzymes  Recent Labs Lab 09/14/16 1249  TROPONINI <0.03   ------------------------------------------------------------------------------------------------------------------  RADIOLOGY:  Ct Head Wo Contrast  Result Date: 09/19/2016 CLINICAL DATA:  Altered mental status.  Hypoxia. EXAM: CT HEAD WITHOUT CONTRAST TECHNIQUE: Contiguous axial images were obtained from the base of the skull through the vertex without intravenous contrast. COMPARISON:  None. FINDINGS: Brain: There is mild diffuse atrophy. There is no intracranial mass, hemorrhage, extra-axial fluid collection, or midline shift. There is mild small vessel disease in the centra semiovale bilaterally. Elsewhere gray-white compartments appear normal. No evident acute infarct. Vascular: There is no appreciable hyperdense vessel. There is calcification in carotid siphon. Skull: The bony calvarium  appears intact. Sinuses/Orbits: There is mucosal thickening in several  ethmoid air cells bilaterally. Other visualized paranasal sinuses are clear. Orbits bilaterally appear symmetric. Other: Visualized mastoid air cells are clear. IMPRESSION: Mild atrophy with mild periventricular small vessel disease. No intracranial mass hemorrhage, or extra-axial fluid collection. No acute infarct evident. Foci of arterial vascular calcification noted. Mild ethmoid sinus disease. Electronically Signed   By: Lowella Grip III M.D.   On: 09/19/2016 12:28    EKG:   Orders placed or performed during the hospital encounter of 09/14/16  . ED EKG  . 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  # Acute hypoxic respiratory failure-secondary to pleural effusion and also right lower lobe infiltrate. Better today, not on BiPAP - positive MRSA PCR- cont vanc, cefepime per ID recs-recommending 10 days course. Recommended Flagyl to be continued for possible aspiration Sputum culture pending -Patient is status post thoracentesis. Transudate -Follow up with pulmonology and infectious disease  # Acute encephalopathy -metabolic CT head negative follow-up with neurology Neurology is recommending EEG  # End-stage renal disease on hemodialysis-on Monday, Wednesday and Friday schedule. -Nephrology consulted and for dialysis.   # CAD-status post cardiac catheterization last month, has significant RCA disease and medical management was recommended at the time. -Continue aspirin, statin and Coreg.  # hypertension-continue home medications patient on Norvasc, Coreg, Lasix, Imdur, spironolactone and lisinopril.  # diabetes mellitus on Levemir and sliding scale  insulin in the hospital.  # DVT prophylaxis-subcutaneous heparin.  All the records are reviewed and case discussed with Care Management/Social Worker. Management plans discussed with the patient, family and they are in agreement.  CODE STATUS: Full code  TOTAL TIME TAKING CARE OF THIS PATIENT: 35 minutes.   POSSIBLE D/C IN 2-3 DAYS, DEPENDING ON CLINICAL CONDITION.   Nicholes Mango M.D on 09/20/2016 at 3:53 PM  Between 7am to 6pm - Pager - [phone]  After 6pm go to [email or web address] - password [email or web address] [facility]  Office  [phone]  CC: Primary care physician; [name], MD

## 2016-09-20 NOTE — Progress Notes (Signed)
HD COMPLETED  

## 2016-09-20 NOTE — Progress Notes (Signed)
[facility] INFECTIOUS DISEASE PROGRESS NOTE Date of Admission:  09/14/2016     ID: Calvin Good is a 76 y.o. male with HCAP Active Problems:   HCAP (healthcare-associated pneumonia)   Acute encephalopathy   Subjective: Seen in HD, no fevers, on baseline O2 CT head neg, Still lethargic  ROS  Unable to obtain   Medications:  Antibiotics Given (last 72 hours)    Date/Time Action Medication Dose Rate   09/17/16 1759 New Bag/Given   cefTAZidime (FORTAZ) 1 g in dextrose 5 % 50 mL IVPB 1 g 100 mL/hr   09/18/16 1319 New Bag/Given   vancomycin (VANCOCIN) IVPB 1000 mg/200 mL premix 1,000 mg 200 mL/hr   09/18/16 1449 Given   metroNIDAZOLE (FLAGYL) tablet 500 mg 500 mg    09/18/16 1804 New Bag/Given   ceftAZIDime (FORTAZ) 2 gram/50 mL D5W IVPB - DUPLEX 2 g 100 mL/hr   09/18/16 2146 Given   metroNIDAZOLE (FLAGYL) tablet 500 mg 500 mg    09/19/16 0953 Given   metroNIDAZOLE (FLAGYL) tablet 500 mg 500 mg    09/19/16 2237 Given   metroNIDAZOLE (FLAGYL) tablet 500 mg 500 mg    09/20/16 0945 Given   metroNIDAZOLE (FLAGYL) tablet 500 mg 500 mg    09/20/16 1452 New Bag/Given  [given during dialysis]   vancomycin (VANCOCIN) IVPB 1000 mg/200 mL premix 1,000 mg 200 mL/hr     . amLODipine  5 mg Oral QPM  . aspirin EC  81 mg Oral Daily  . atorvastatin  40 mg Oral Daily  . benzonatate  100 mg Oral TID  . calcium acetate  1,334 mg Oral TID WC  . carvedilol  6.25 mg Oral BID  . doxazosin  8 mg Oral Daily  . epoetin (EPOGEN/PROCRIT) injection  4,000 Units Intravenous Q M,W,F-HD  . heparin subcutaneous  5,000 Units Subcutaneous Q8H  . insulin aspart  0-5 Units Subcutaneous QHS  . insulin aspart  0-9 Units Subcutaneous TID WC  . insulin detemir  30 Units Subcutaneous QHS  . isosorbide mononitrate  30 mg Oral Daily  . lisinopril  20 mg Oral Daily  . mouth rinse  15 mL Mouth Rinse BID  . metroNIDAZOLE  500 mg Oral Q12H  . pantoprazole  40 mg Oral Daily    Objective: Vital signs in last  24 hours: Temp:  [97.7 F (36.5 C)-98.9 F (37.2 C)] 98.2 F (36.8 C) (06/22 1245) Pulse Rate:  [73-90] 74 (06/22 1500) Resp:  [6-20] 11 (06/22 1500) BP: (89-135)/(28-82) 101/47 (06/22 1500) SpO2:  [89 %-100 %] 93 % (06/22 1051) FiO2 (%):  [35 %] 35 % (06/22 0300) Weight:  [104.8 kg (231 lb 0.7 oz)] 104.8 kg (231 lb 0.7 oz) (06/22 1245) Constitutional: He is confused. Frail appearing HENT: anicteric  Mouth/Throat: Oropharynx is clear and moist. No oropharyngeal exudate.  Cardiovascular: Normal rate, regular rhythm and normal heart sounds.  Pulmonary/Chest: bil rhonchi  Abdominal: Soft. Bowel sounds are normal. He exhibits no distension.   Lymphadenopathy: He has no cervical adenopathy.  Ext LUE avf wnl Neurological: He is alert and oriented to person, place, and time.  Skin: Skin is warm and dry. No rash noted. No erythema.  Psychiatric:  Confused   Lab Results  Recent Labs  09/18/16 1200 09/19/16 0418  WBC 8.2 8.0  HGB 8.8* 10.2*  HCT 26.5* 30.0*  NA 130* 136  K 4.5 4.1  CL 94* 97*  CO2 26 31  BUN 62* 45*  CREATININE 7.61* 6.05*  Microbiology: Results for orders placed or performed during the hospital encounter of 09/14/16  Blood Culture (routine x 2)     Status: Abnormal   Collection Time: 09/14/16  2:53 PM  Result Value Ref Range Status   Specimen Description BLOOD RIGHT [facility]  Final   Special Requests   Final    BOTTLES DRAWN AEROBIC AND ANAEROBIC Blood Culture adequate volume   Culture  Setup Time   Final    GRAM POSITIVE COCCI AEROBIC BOTTLE ONLY CRITICAL RESULT CALLED TO, READ BACK BY AND VERIFIED WITH: Sheema Hallaji @ 0800 09/15/16 by [facility]    Culture (A)  Final    STAPHYLOCOCCUS SPECIES (COAGULASE NEGATIVE) THE SIGNIFICANCE OF ISOLATING THIS ORGANISM FROM A SINGLE SET OF BLOOD CULTURES WHEN MULTIPLE SETS ARE DRAWN IS UNCERTAIN. PLEASE NOTIFY THE MICROBIOLOGY DEPARTMENT WITHIN ONE WEEK IF SPECIATION AND SENSITIVITIES ARE REQUIRED. Performed at [facility], [place] [address] [place], [place] [address]    Report Status 09/17/2016 FINAL  Final  Blood Culture ID Panel (Reflexed)     Status: Abnormal   Collection Time: 09/14/16  2:53 PM  Result Value Ref Range Status   Enterococcus species (A) NOT DETECTED Final    THIS TEST WAS ORDERED IN ERROR AND HAS BEEN CREDITED.    Comment: INCORRECT PANEL SET UP. SPECIMEN AGE LIMIT EXCEEDED WHEN ERROR WAS DISCOVERED. CALLED TO CHRISTINE KATSOUDAS 09/16/16 1100. SGD   Listeria monocytogenes TEST WILL BE CREDITED (A) NOT DETECTED Final   Staphylococcus species TEST WILL BE CREDITED (A) NOT DETECTED Final   Staphylococcus aureus TEST WILL BE CREDITED (A) NOT DETECTED Final   Streptococcus species TEST WILL BE CREDITED (A) NOT DETECTED Final   Streptococcus agalactiae TEST WILL BE CREDITED (A) NOT DETECTED Final   Streptococcus pneumoniae TEST WILL BE CREDITED (A) NOT DETECTED Final   Streptococcus pyogenes TEST WILL BE CREDITED (A) NOT DETECTED Final   Acinetobacter baumannii TEST WILL BE CREDITED (A) NOT DETECTED Final   Enterobacteriaceae species TEST WILL BE CREDITED (A) NOT DETECTED Final   Enterobacter cloacae complex TEST WILL BE CREDITED (A) NOT DETECTED Final   Escherichia coli TEST WILL BE CREDITED (A) NOT DETECTED Final   Klebsiella oxytoca TEST WILL BE CREDITED (A) NOT DETECTED Final   Klebsiella pneumoniae TEST WILL BE CREDITED (A) NOT DETECTED Final   Proteus species TEST WILL BE CREDITED (A) NOT DETECTED Final   Serratia marcescens TEST WILL BE CREDITED (A) NOT DETECTED Final   Haemophilus influenzae TEST WILL BE CREDITED (A) NOT DETECTED Final   Neisseria meningitidis TEST WILL BE CREDITED (A) NOT DETECTED Final   Pseudomonas aeruginosa TEST WILL BE CREDITED (A) NOT DETECTED Final   Candida albicans TEST WILL BE CREDITED (A) NOT DETECTED Final   Candida glabrata TEST WILL BE CREDITED (A) NOT DETECTED Final   Candida krusei TEST WILL BE CREDITED (A) NOT DETECTED Final   Candida  parapsilosis TEST WILL BE CREDITED (A) NOT DETECTED Final   Candida tropicalis TEST WILL BE CREDITED (A) NOT DETECTED Final  Blood Culture (routine x 2)     Status: Abnormal   Collection Time: 09/14/16  2:58 PM  Result Value Ref Range Status   Specimen Description BLOOD BLOOD LEFT HAND  Final   Special Requests   Final    BOTTLES DRAWN AEROBIC AND ANAEROBIC Blood Culture adequate volume   Culture  Setup Time   Final    GRAM POSITIVE RODS AEROBIC BOTTLE ONLY CRITICAL RESULT CALLED TO, READ BACK  BY AND VERIFIED WITH: MATT MCBANE AT 1443 ON 09/15/16 RWW CONFIRMED BY [facility]    Culture (A)  Final    BACILLUS SPECIES Standardized susceptibility testing for this organism is not available. Performed at [facility], [place] [address], [place], [place] [address]    Report Status 09/17/2016 FINAL  Final  Blood Culture ID Panel (Reflexed)     Status: None   Collection Time: 09/14/16  2:58 PM  Result Value Ref Range Status   Enterococcus species NOT DETECTED NOT DETECTED Final   Listeria monocytogenes NOT DETECTED NOT DETECTED Final   Staphylococcus species NOT DETECTED NOT DETECTED Final   Staphylococcus aureus NOT DETECTED NOT DETECTED Final   Streptococcus species NOT DETECTED NOT DETECTED Final   Streptococcus agalactiae NOT DETECTED NOT DETECTED Final   Streptococcus pneumoniae NOT DETECTED NOT DETECTED Final   Streptococcus pyogenes NOT DETECTED NOT DETECTED Final   Acinetobacter baumannii NOT DETECTED NOT DETECTED Final   Enterobacteriaceae species NOT DETECTED NOT DETECTED Final   Enterobacter cloacae complex NOT DETECTED NOT DETECTED Final   Escherichia coli NOT DETECTED NOT DETECTED Final   Klebsiella oxytoca NOT DETECTED NOT DETECTED Final   Klebsiella pneumoniae NOT DETECTED NOT DETECTED Final   Proteus species NOT DETECTED NOT DETECTED Final   Serratia marcescens NOT DETECTED NOT DETECTED Final   Haemophilus influenzae NOT DETECTED NOT DETECTED Final   Neisseria meningitidis  NOT DETECTED NOT DETECTED Final   Pseudomonas aeruginosa NOT DETECTED NOT DETECTED Final   Candida albicans NOT DETECTED NOT DETECTED Final   Candida glabrata NOT DETECTED NOT DETECTED Final   Candida krusei NOT DETECTED NOT DETECTED Final   Candida parapsilosis NOT DETECTED NOT DETECTED Final   Candida tropicalis NOT DETECTED NOT DETECTED Final  Body fluid culture (includes gram stain)     Status: None   Collection Time: 09/14/16  3:47 PM  Result Value Ref Range Status   Specimen Description PLEURAL  Final   Special Requests PLEURAL  Final   Gram Stain   Final    RARE WBC PRESENT, PREDOMINANTLY MONONUCLEAR NO ORGANISMS SEEN    Culture   Final    NO GROWTH 3 DAYS Performed at [facility], [address] [place], [place] [address]    Report Status 09/18/2016 FINAL  Final  Culture, sputum-assessment     Status: None   Collection Time: 09/14/16  6:32 PM  Result Value Ref Range Status   Specimen Description SPUTUM  Final   Special Requests Immunocompromised  Final   Sputum evaluation THIS SPECIMEN IS ACCEPTABLE FOR SPUTUM CULTURE  Final   Report Status 09/14/2016 FINAL  Final  Culture, respiratory (NON-Expectorated)     Status: None   Collection Time: 09/14/16  6:32 PM  Result Value Ref Range Status   Specimen Description SPUTUM  Final   Special Requests Immunocompromised Reflexed from [id number]  Final   Gram Stain   Final    FEW WBC PRESENT,BOTH PMN AND MONONUCLEAR FEW GRAM NEGATIVE RODS FEW GRAM NEGATIVE COCCI IN PAIRS    Culture   Final    Consistent with normal respiratory flora. Performed at [facility], [place] [address], [place], [place] [address]    Report Status 09/17/2016 FINAL  Final  MRSA PCR Screening     Status: Abnormal   Collection Time: 09/15/16  8:30 PM  Result Value Ref Range Status   MRSA by PCR POSITIVE (A) NEGATIVE Corrected    Comment: [facility]  AT 2155  ON 09/15/16 RWW        The GeneXpert MRSA Assay (FDA approved for NASAL  specimens only), is one component of a comprehensive MRSA colonization surveillance program. It is not intended to diagnose MRSA infection nor to guide or monitor treatment for MRSA infections. CORRECTED ON 06/18 AT 0544: PREVIOUSLY REPORTED AS POSITIVE        The GeneXpert MRSA Assay (FDA approved for NASAL specimens only), is one component of a comprehensive MRSA colonization surveillance program. It is not intended to diagnose MRSA infection  nor to guide or monitor treatment for MRSA infections. RESULT CALLED TO, READ BACK BY AND VERIFIED WITH: [facility] AT 2155 ON 09/15/16 RWW     Studies/Results: Ct Head Wo Contrast  Result Date: 09/19/2016 CLINICAL DATA:  Altered mental status.  Hypoxia. EXAM: CT HEAD WITHOUT CONTRAST TECHNIQUE: Contiguous axial images were obtained from the base of the skull through the vertex without intravenous contrast. COMPARISON:  None. FINDINGS: Brain: There is mild diffuse atrophy. There is no intracranial mass, hemorrhage, extra-axial fluid collection, or midline shift. There is mild small vessel disease in the centra semiovale bilaterally. Elsewhere gray-white compartments appear normal. No evident acute infarct. Vascular: There is no appreciable hyperdense vessel. There is calcification in carotid siphon. Skull: The bony calvarium appears intact. Sinuses/Orbits: There is mucosal thickening in several ethmoid air cells bilaterally. Other visualized paranasal sinuses are clear. Orbits bilaterally appear symmetric. Other: Visualized mastoid air cells are clear. IMPRESSION: Mild atrophy with mild periventricular small vessel disease. No intracranial mass hemorrhage, or extra-axial fluid collection. No acute infarct evident. Foci of arterial vascular calcification noted. Mild ethmoid sinus disease. Electronically Signed   By: Lowella Grip III M.D.   On: 09/19/2016 12:28    Assessment/Plan: Keontay Vora is a 76 y.o. 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 BCX + bacillus x1 and CNS x 1.  Pleural fluid studies do not suggest infection and cx negative. Was clinically improving but declined due to resp distress and fever and confusio. Jun 22- clincially improving but remains confused. Sputum cx just nml flora  Recommendations Cont  treatment for HCAP with vanco and ceftazidime. These can be given at HD. Continue flagyl as well for aspiration Rec a 10 day course -  stop date 6/25 I will sign off but please call with questions  Thank you very much for the consult.  Linda Grimmer P   09/20/2016, 3:18 PM

## 2016-09-20 NOTE — Progress Notes (Signed)
PT Cancellation Note  Patient Details Name: Calvin Good MRN: [id number] DOB: May 22, 1940   Cancelled Treatment:    Reason Eval/Treat Not Completed: Patient at procedure or test/unavailable (Consult received and chart reviewed.  Patient currently off unit at dialysis; will re-attempt at later time/date as medically appropriate and available.)   Bayan Kushnir H. Owens Shark, PT, DPT, NCS 09/20/16, 3:35 PM [phone]

## 2016-09-20 NOTE — Progress Notes (Addendum)
No respiratory distress. Comfortable on nasal cannula oxygen. States he has had difficulty finding his words. Communicating this morning.  Vitals:   09/20/16 0400 09/20/16 0500 09/20/16 0600 09/20/16 0700  BP: (!) 101/34 (!) 94/29 (!) 89/65 (!) 94/35  Pulse: 76 73 73 74  Resp: 16 12 10  (!) 7  Temp:  97.7 F (36.5 C)    TempSrc:  Axillary    SpO2: 100% 95% 99% 98%  Weight:      Height:        NAD HEENT WNL No JVD No wheezes Regular, no murmurs Abdomen soft, bowel sounds present Extremities warm without edema Cranial nerves intact, moves all extremities, equal strength and DTRs Asterixis present - worse on right than left Difficulty with word finding  BMP Latest Ref Rng & Units 09/19/2016 09/18/2016 09/16/2016  Glucose 65 - 99 mg/dL 148(H) 168(H) 98  BUN 6 - 20 mg/dL 45(H) 62(H) 49(H)  Creatinine 0.61 - 1.24 mg/dL 6.05(H) 7.61(H) 6.16(H)  Sodium 135 - 145 mmol/L 136 130(L) 136  Potassium 3.5 - 5.1 mmol/L 4.1 4.5 3.5  Chloride 101 - 111 mmol/L 97(L) 94(L) 100(L)  CO2 22 - 32 mmol/L 31 26 28   Calcium 8.9 - 10.3 mg/dL 8.6(L) 8.6(L) 8.8(L)      CBC Latest Ref Rng & Units 09/19/2016 09/18/2016 09/16/2016  WBC 3.8 - 10.6 K/uL 8.0 8.2 8.6  Hemoglobin 13.0 - 18.0 g/dL 10.2(L) 8.8(L) 9.3(L)  Hematocrit 40.0 - 52.0 % 30.0(L) 26.5(L) 27.4(L)  Platelets 150 - 440 K/uL 145(L) 146(L) 139(L)   No new chest x-ray  IMPRESSION: Mild chronic hypercarbic respiratory failure Probable pneumonia - antibiotics per ID Apparent expressive aphasia and asterixis of unclear etiology  PLAN/REC: No changes to his medical regimen Hemodialysis today Stable for transfer to monitored bed-telemetry neurology consultation, feel predominantly medical encephalopathy Antibiotics per ID service, presently on ceftaz edema  No additional further recommendations at this time. We'll sign off, please reconsult if can be of any assistance  Hermelinda Dellen, DO PCCM service 09/20/2016 7:56 AM

## 2016-09-20 NOTE — Progress Notes (Signed)
POST DIALYSIS ASSESSMENT 

## 2016-09-20 NOTE — Progress Notes (Signed)
Report called to Mia [place], RN on 2A. Patient transported to room 251 on hospital bed w/ O2 @ 2 lpm via [place] and on telemetry box by [place].

## 2016-09-20 NOTE — Progress Notes (Signed)
Placed bipap back on pt at 35%.

## 2016-09-20 NOTE — Progress Notes (Signed)
Hd started  

## 2016-09-20 NOTE — Progress Notes (Signed)
[facility]  ROUNDING NOTE   Subjective:   [name] admitted to [facility] on 09/14/2016 for Shortness of breath [R06.02] Pleural effusion [J90] HCAP (healthcare-associated pneumonia) [J18.9] Acute on chronic respiratory failure with hypoxia ([place]) [J96.21]  Patient found to have right sided pleural effusion. Thoracentesis done. 1060 mL pleural fluid removed.  Underwent urgent HD yesterday. 2.2 L of fluid was removed Doing better today Daughter at bedside.  Eating lunch when seen.  Able to speak. Making jokes    Objective:  Vital signs in last 24 hours:  Temp:  [97.7 F (36.5 C)-98.9 F (37.2 C)] 98.2 F (36.8 C) (06/22 1245) Pulse Rate:  [73-90] 79 (06/22 1530) Resp:  [6-20] 11 (06/22 1530) BP: (89-135)/(28-82) 127/60 (06/22 1530) SpO2:  [89 %-100 %] 93 % (06/22 1051) FiO2 (%):  [35 %] 35 % (06/22 0300) Weight:  [104.8 kg (231 lb 0.7 oz)] 104.8 kg (231 lb 0.7 oz) (06/22 1245)  Weight change:  Filed Weights   09/16/16 0940 09/18/16 1050 09/20/16 1245  Weight: 109 kg (240 lb 4.8 oz) 105 kg (231 lb 7.7 oz) 104.8 kg (231 lb 0.7 oz)    Intake/Output: I/O last 3 completed shifts: In: 4 [IV Piggyback:50] Out: 250 [Urine:250]   Intake/Output this shift:  Total I/O In: 240 [P.O.:240] Out: -   Physical Exam: General: NAD,    Head: Moist oral mucosal membranes  Eyes: Anicteric,    Neck: Supple,  Lungs:  Mild  crackles at right  base, [place] O2  Heart: No rub  Abdomen:  Soft, nontender,   Extremities: + peripheral edema.  Neurologic: Alert, follows commands         Access: Left upper arm AVF     Basic Metabolic Panel:  Recent Labs Lab 09/14/16 1249 09/15/16 0510 09/16/16 0450 09/18/16 1200 09/19/16 0418  NA 134* 135 136 130* 136  K 3.9 3.5 3.5 4.5 4.1  CL 96* 99* 100* 94* 97*  CO2 28 28 28 26 31   GLUCOSE 280* 260* 98 168* 148*  BUN 27* 39* 49* 62* 45*  CREATININE 3.98* 4.81* 6.16* 7.61* 6.05*  CALCIUM 8.9 8.6* 8.8* 8.6* 8.6*  PHOS  --    --   --  4.8*  --     Liver Function Tests:  Recent Labs Lab 09/15/16 0510 09/18/16 1200 09/19/16 0418  AST 13*  --  11*  ALT 13*  --  11*  ALKPHOS 54  --  43  BILITOT 0.9  --  0.8  PROT 6.5  --  7.0  ALBUMIN 3.4* 3.3* 3.3*   No results for input(s): LIPASE, AMYLASE in the last 168 hours. No results for input(s): AMMONIA in the last 168 hours.  CBC:  Recent Labs Lab 09/14/16 1249 09/15/16 0510 09/16/16 0450 09/18/16 1200 09/19/16 0418  WBC 8.2 10.5 8.6 8.2 8.0  NEUTROABS 5.8  --   --   --   --   HGB 10.3* 9.9* 9.3* 8.8* 10.2*  HCT 29.4* 29.0* 27.4* 26.5* 30.0*  MCV 86.9 87.0 87.6 88.8 89.2  PLT 175 160 139* 146* 145*    Cardiac Enzymes:  Recent Labs Lab 09/14/16 1249  TROPONINI <0.03    BNP: Invalid input(s): POCBNP  CBG:  Recent Labs Lab 09/19/16 1154 09/19/16 1639 09/19/16 2213 09/20/16 0730 09/20/16 1110  GLUCAP 218* 150* 195* 154* 277*    Microbiology: Results for orders placed or performed during the hospital encounter of 09/14/16  Blood Culture (routine x 2)  Status: Abnormal   Collection Time: 09/14/16  2:53 PM  Result Value Ref Range Status   Specimen Description BLOOD RIGHT AC  Final   Special Requests   Final    BOTTLES DRAWN AEROBIC AND ANAEROBIC Blood Culture adequate volume   Culture  Setup Time   Final    GRAM POSITIVE COCCI AEROBIC BOTTLE ONLY CRITICAL RESULT CALLED TO, READ BACK BY AND VERIFIED WITH: Sheema Hallaji @ 0800 09/15/16 by [facility]    Culture (A)  Final    STAPHYLOCOCCUS SPECIES (COAGULASE NEGATIVE) THE SIGNIFICANCE OF ISOLATING THIS ORGANISM FROM A SINGLE SET OF BLOOD CULTURES WHEN MULTIPLE SETS ARE DRAWN IS UNCERTAIN. PLEASE NOTIFY THE MICROBIOLOGY DEPARTMENT WITHIN ONE WEEK IF SPECIATION AND SENSITIVITIES ARE REQUIRED. Performed at [facility], [place] [address], [place], [place] [address]    Report Status 09/17/2016 FINAL  Final  Blood Culture ID Panel (Reflexed)     Status: Abnormal   Collection Time:  09/14/16  2:53 PM  Result Value Ref Range Status   Enterococcus species (A) NOT DETECTED Final    THIS TEST WAS ORDERED IN ERROR AND HAS BEEN CREDITED.    Comment: INCORRECT PANEL SET UP. SPECIMEN AGE LIMIT EXCEEDED WHEN ERROR WAS DISCOVERED. CALLED TO CHRISTINE KATSOUDAS 09/16/16 1100. SGD   Listeria monocytogenes TEST WILL BE CREDITED (A) NOT DETECTED Final   Staphylococcus species TEST WILL BE CREDITED (A) NOT DETECTED Final   Staphylococcus aureus TEST WILL BE CREDITED (A) NOT DETECTED Final   Streptococcus species TEST WILL BE CREDITED (A) NOT DETECTED Final   Streptococcus agalactiae TEST WILL BE CREDITED (A) NOT DETECTED Final   Streptococcus pneumoniae TEST WILL BE CREDITED (A) NOT DETECTED Final   Streptococcus pyogenes TEST WILL BE CREDITED (A) NOT DETECTED Final   Acinetobacter baumannii TEST WILL BE CREDITED (A) NOT DETECTED Final   Enterobacteriaceae species TEST WILL BE CREDITED (A) NOT DETECTED Final   Enterobacter cloacae complex TEST WILL BE CREDITED (A) NOT DETECTED Final   Escherichia coli TEST WILL BE CREDITED (A) NOT DETECTED Final   Klebsiella oxytoca TEST WILL BE CREDITED (A) NOT DETECTED Final   Klebsiella pneumoniae TEST WILL BE CREDITED (A) NOT DETECTED Final   Proteus species TEST WILL BE CREDITED (A) NOT DETECTED Final   Serratia marcescens TEST WILL BE CREDITED (A) NOT DETECTED Final   Haemophilus influenzae TEST WILL BE CREDITED (A) NOT DETECTED Final   Neisseria meningitidis TEST WILL BE CREDITED (A) NOT DETECTED Final   Pseudomonas aeruginosa TEST WILL BE CREDITED (A) NOT DETECTED Final   Candida albicans TEST WILL BE CREDITED (A) NOT DETECTED Final   Candida glabrata TEST WILL BE CREDITED (A) NOT DETECTED Final   Candida krusei TEST WILL BE CREDITED (A) NOT DETECTED Final   Candida parapsilosis TEST WILL BE CREDITED (A) NOT DETECTED Final   Candida tropicalis TEST WILL BE CREDITED (A) NOT DETECTED Final  Blood Culture (routine x 2)     Status: Abnormal    Collection Time: 09/14/16  2:58 PM  Result Value Ref Range Status   Specimen Description BLOOD BLOOD LEFT HAND  Final   Special Requests   Final    BOTTLES DRAWN AEROBIC AND ANAEROBIC Blood Culture adequate volume   Culture  Setup Time   Final    GRAM POSITIVE RODS AEROBIC BOTTLE ONLY CRITICAL RESULT CALLED TO, READ BACK BY AND VERIFIED WITH: MATT MCBANE AT 6948 ON 09/15/16 RWW CONFIRMED BY [facility]    Culture (A)  Final  BACILLUS SPECIES Standardized susceptibility testing for this organism is not available. Performed at [facility], [place] [address], [place], [place] [address]    Report Status 09/17/2016 FINAL  Final  Blood Culture ID Panel (Reflexed)     Status: None   Collection Time: 09/14/16  2:58 PM  Result Value Ref Range Status   Enterococcus species NOT DETECTED NOT DETECTED Final   Listeria monocytogenes NOT DETECTED NOT DETECTED Final   Staphylococcus species NOT DETECTED NOT DETECTED Final   Staphylococcus aureus NOT DETECTED NOT DETECTED Final   Streptococcus species NOT DETECTED NOT DETECTED Final   Streptococcus agalactiae NOT DETECTED NOT DETECTED Final   Streptococcus pneumoniae NOT DETECTED NOT DETECTED Final   Streptococcus pyogenes NOT DETECTED NOT DETECTED Final   Acinetobacter baumannii NOT DETECTED NOT DETECTED Final   Enterobacteriaceae species NOT DETECTED NOT DETECTED Final   Enterobacter cloacae complex NOT DETECTED NOT DETECTED Final   Escherichia coli NOT DETECTED NOT DETECTED Final   Klebsiella oxytoca NOT DETECTED NOT DETECTED Final   Klebsiella pneumoniae NOT DETECTED NOT DETECTED Final   Proteus species NOT DETECTED NOT DETECTED Final   Serratia marcescens NOT DETECTED NOT DETECTED Final   Haemophilus influenzae NOT DETECTED NOT DETECTED Final   Neisseria meningitidis NOT DETECTED NOT DETECTED Final   Pseudomonas aeruginosa NOT DETECTED NOT DETECTED Final   Candida albicans NOT DETECTED NOT DETECTED Final   Candida glabrata NOT DETECTED NOT  DETECTED Final   Candida krusei NOT DETECTED NOT DETECTED Final   Candida parapsilosis NOT DETECTED NOT DETECTED Final   Candida tropicalis NOT DETECTED NOT DETECTED Final  Body fluid culture (includes gram stain)     Status: None   Collection Time: 09/14/16  3:47 PM  Result Value Ref Range Status   Specimen Description PLEURAL  Final   Special Requests PLEURAL  Final   Gram Stain   Final    RARE WBC PRESENT, PREDOMINANTLY MONONUCLEAR NO ORGANISMS SEEN    Culture   Final    NO GROWTH 3 DAYS Performed at [facility], [address], [place], [place] [address]    Report Status 09/18/2016 FINAL  Final  Culture, sputum-assessment     Status: None   Collection Time: 09/14/16  6:32 PM  Result Value Ref Range Status   Specimen Description SPUTUM  Final   Special Requests Immunocompromised  Final   Sputum evaluation THIS SPECIMEN IS ACCEPTABLE FOR SPUTUM CULTURE  Final   Report Status 09/14/2016 FINAL  Final  Culture, respiratory (NON-Expectorated)     Status: None   Collection Time: 09/14/16  6:32 PM  Result Value Ref Range Status   Specimen Description SPUTUM  Final   Special Requests Immunocompromised Reflexed from [id number]  Final   Gram Stain   Final    FEW WBC PRESENT,BOTH PMN AND MONONUCLEAR FEW GRAM NEGATIVE RODS FEW GRAM NEGATIVE COCCI IN PAIRS    Culture   Final    Consistent with normal respiratory flora. Performed at [facility], [place] [address], [place], [place] [address]    Report Status 09/17/2016 FINAL  Final  MRSA PCR Screening     Status: Abnormal   Collection Time: 09/15/16  8:30 PM  Result Value Ref Range Status   MRSA by PCR POSITIVE (A) NEGATIVE Corrected    Comment: MARSHA HATCH  AT 2155 ON 09/15/16 RWW        The GeneXpert MRSA Assay (FDA approved for NASAL specimens only), is one component of  a comprehensive MRSA colonization surveillance program. It is not intended to diagnose MRSA infection nor to guide or monitor treatment for MRSA  infections. CORRECTED ON 06/18 AT 0544: PREVIOUSLY REPORTED AS POSITIVE        The GeneXpert MRSA Assay (FDA approved for NASAL specimens only), is one component of a comprehensive MRSA colonization surveillance program. It is not intended to diagnose MRSA infection  nor to guide or monitor treatment for MRSA infections. RESULT CALLED TO, READ BACK BY AND VERIFIED WITH: MARSHAATCH AT 2155 ON 09/15/16 RWW     Coagulation Studies: No results for input(s): LABPROT, INR in the last 72 hours.  Urinalysis: No results for input(s): COLORURINE, LABSPEC, PHURINE, GLUCOSEU, HGBUR, BILIRUBINUR, KETONESUR, PROTEINUR, UROBILINOGEN, NITRITE, LEUKOCYTESUR in the last 72 hours.  Invalid input(s): APPERANCEUR    Imaging: Ct Head Wo Contrast  Result Date: 09/19/2016 CLINICAL DATA:  Altered mental status.  Hypoxia. EXAM: CT HEAD WITHOUT CONTRAST TECHNIQUE: Contiguous axial images were obtained from the base of the skull through the vertex without intravenous contrast. COMPARISON:  None. FINDINGS: Brain: There is mild diffuse atrophy. There is no intracranial mass, hemorrhage, extra-axial fluid collection, or midline shift. There is mild small vessel disease in the centra semiovale bilaterally. Elsewhere gray-white compartments appear normal. No evident acute infarct. Vascular: There is no appreciable hyperdense vessel. There is calcification in carotid siphon. Skull: The bony calvarium appears intact. Sinuses/Orbits: There is mucosal thickening in several ethmoid air cells bilaterally. Other visualized paranasal sinuses are clear. Orbits bilaterally appear symmetric. Other: Visualized mastoid air cells are clear. IMPRESSION: Mild atrophy with mild periventricular small vessel disease. No intracranial mass hemorrhage, or extra-axial fluid collection. No acute infarct evident. Foci of arterial vascular calcification noted. Mild ethmoid sinus disease. Electronically Signed   By: Lowella Grip III M.D.   On:  09/19/2016 12:28     Medications:   . [id number]    . [START ON 09/23/2016] vancomycin     . amLODipine  5 mg Oral QPM  . aspirin EC  81 mg Oral Daily  . atorvastatin  40 mg Oral Daily  . benzonatate  100 mg Oral TID  . calcium acetate  1,334 mg Oral TID WC  . carvedilol  6.25 mg Oral BID  . doxazosin  8 mg Oral Daily  . epoetin (EPOGEN/PROCRIT) injection  4,000 Units Intravenous Q M,W,F-HD  . heparin subcutaneous  5,000 Units Subcutaneous Q8H  . insulin aspart  0-5 Units Subcutaneous QHS  . insulin aspart  0-9 Units Subcutaneous TID WC  . insulin detemir  30 Units Subcutaneous QHS  . isosorbide mononitrate  30 mg Oral Daily  . lisinopril  20 mg Oral Daily  . mouth rinse  15 mL Mouth Rinse BID  . metroNIDAZOLE  500 mg Oral Q12H  . pantoprazole  40 mg Oral Daily   acetaminophen **OR** acetaminophen, guaiFENesin-dextromethorphan, ipratropium-albuterol, labetalol, ondansetron **OR** ondansetron (ZOFRAN) IV  Assessment/ Plan:  [name] is a 76 y.o. white male with diabetes mellitus type II insulin dependent, hypertension, coronary artery disease, hyperlipidemia, prostate cancer status post prostatectomy, right total knee, appendectomy.   MWF [facility] [place] AVF  1. End Stage Renal Disease MWF:  2.2 L removed with HD last treatment Next HD scheduled for later today  2.  Anemia of chronic kidney disease: hemoglobin 10.2 - Mircera as outpatient.  - EPO while inpatient  3.  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  Continue good control of blood sugars  4. Secondary Hyperparathyroidism:   - calcium acetate with meals  5. Acute respiratory failure Pneumonia: with pleural effusion, not consistent with empyema. Status post right thoracentesis 6/16.  - IV Abx as per ID team  Of BiPAP now      LOS: 6 Calvin Good 6/22/20183:55 PM  Late entry

## 2016-09-20 NOTE — Progress Notes (Signed)
PRE DIALYSIS ASSESSMENT 

## 2016-09-20 NOTE — Progress Notes (Signed)
Subjective: Patient awake and alert today.  Remains some confused.    Objective: Current vital signs: BP (!) 125/44   Pulse 79   Temp 98.3 F (36.8 C) (Oral)   Resp (!) 9   Ht 5\' 9"  (1.753 m)   Wt 105 kg (231 lb 7.7 oz)   SpO2 97%   BMI 34.18 kg/m  Vital signs in last 24 hours: Temp:  [97.7 F (36.5 C)-99.2 F (37.3 C)] 98.3 F (36.8 C) (06/22 0800) Pulse Rate:  [73-95] 79 (06/22 1000) Resp:  [6-20] 9 (06/22 1000) BP: (89-126)/(28-77) 125/44 (06/22 1000) SpO2:  [89 %-100 %] 97 % (06/22 1000) FiO2 (%):  [35 %] 35 % (06/22 0300)  Intake/Output from previous day: 06/21 0701 - 06/22 0700 In: 36 [IV Piggyback:50] Out: -  Intake/Output this shift: Total I/O In: 240 [P.O.:240] Out: -  Nutritional status: Diet renal/carb modified with fluid restriction Diet-HS Snack? Nothing; Room service appropriate? Yes; Fluid consistency: Thin  Neurologic Exam: Mental Status: Awake.  Alert.  Knows where he is but can not tell me the year or his age. Follows simple commands.  Requires reinforcement for 3-step commands  Cranial Nerves: II: Discs flat bilaterally; EOMI, pupils equal, round, reactive to light and accommodation III,IV, VI: ptosis not present, extra-ocular motions intact bilaterally V,VII: smile symmetric, facial light touch sensation normal bilaterally VIII: hearing normal bilaterally IX,X: gag reflex present XI: bilateral shoulder shrug XII: midline tongue extension Motor: Some tremor noted but no myoclonus noted. Lifts all extremities against gravity with no focal weakness noted.     Sensory: Light touch sensation intact throughout   Lab Results: Basic Metabolic Panel:  Recent Labs Lab 09/14/16 1249 09/15/16 0510 09/16/16 0450 09/18/16 1200 09/19/16 0418  NA 134* 135 136 130* 136  K 3.9 3.5 3.5 4.5 4.1  CL 96* 99* 100* 94* 97*  CO2 28 28 28 26 31   GLUCOSE 280* 260* 98 168* 148*  BUN 27* 39* 49* 62* 45*  CREATININE 3.98* 4.81* 6.16* 7.61* 6.05*  CALCIUM 8.9  8.6* 8.8* 8.6* 8.6*  PHOS  --   --   --  4.8*  --     Liver Function Tests:  Recent Labs Lab 09/15/16 0510 09/18/16 1200 09/19/16 0418  AST 13*  --  11*  ALT 13*  --  11*  ALKPHOS 54  --  43  BILITOT 0.9  --  0.8  PROT 6.5  --  7.0  ALBUMIN 3.4* 3.3* 3.3*   No results for input(s): LIPASE, AMYLASE in the last 168 hours. No results for input(s): AMMONIA in the last 168 hours.  CBC:  Recent Labs Lab 09/14/16 1249 09/15/16 0510 09/16/16 0450 09/18/16 1200 09/19/16 0418  WBC 8.2 10.5 8.6 8.2 8.0  NEUTROABS 5.8  --   --   --   --   HGB 10.3* 9.9* 9.3* 8.8* 10.2*  HCT 29.4* 29.0* 27.4* 26.5* 30.0*  MCV 86.9 87.0 87.6 88.8 89.2  PLT 175 160 139* 146* 145*    Cardiac Enzymes:  Recent Labs Lab 09/14/16 1249  TROPONINI <0.03    Lipid Panel: No results for input(s): CHOL, TRIG, HDL, CHOLHDL, VLDL, LDLCALC in the last 168 hours.  CBG:  Recent Labs Lab 09/19/16 0721 09/19/16 1154 09/19/16 1639 09/19/16 2213 09/20/16 0730  GLUCAP 186* 218* 150* 195* 154*    Microbiology: Results for orders placed or performed during the hospital encounter of 09/14/16  Blood Culture (routine x 2)     Status: Abnormal  Collection Time: 09/14/16  2:53 PM  Result Value Ref Range Status   Specimen Description BLOOD RIGHT AC  Final   Special Requests   Final    BOTTLES DRAWN AEROBIC AND ANAEROBIC Blood Culture adequate volume   Culture  Setup Time   Final    GRAM POSITIVE COCCI AEROBIC BOTTLE ONLY CRITICAL RESULT CALLED TO, READ BACK BY AND VERIFIED WITH: Sheema Hallaji @ 0800 09/15/16 by [facility]    Culture (A)  Final    STAPHYLOCOCCUS SPECIES (COAGULASE NEGATIVE) THE SIGNIFICANCE OF ISOLATING THIS ORGANISM FROM A SINGLE SET OF BLOOD CULTURES WHEN MULTIPLE SETS ARE DRAWN IS UNCERTAIN. PLEASE NOTIFY THE MICROBIOLOGY DEPARTMENT WITHIN ONE WEEK IF SPECIATION AND SENSITIVITIES ARE REQUIRED. Performed at [facility], [place] [address], [place], [place] [address]    Report Status  09/17/2016 FINAL  Final  Blood Culture ID Panel (Reflexed)     Status: Abnormal   Collection Time: 09/14/16  2:53 PM  Result Value Ref Range Status   Enterococcus species (A) NOT DETECTED Final    THIS TEST WAS ORDERED IN ERROR AND HAS BEEN CREDITED.    Comment: INCORRECT PANEL SET UP. SPECIMEN AGE LIMIT EXCEEDED WHEN ERROR WAS DISCOVERED. CALLED TO CHRISTINE KATSOUDAS 09/16/16 1100. SGD   Listeria monocytogenes TEST WILL BE CREDITED (A) NOT DETECTED Final   Staphylococcus species TEST WILL BE CREDITED (A) NOT DETECTED Final   Staphylococcus aureus TEST WILL BE CREDITED (A) NOT DETECTED Final   Streptococcus species TEST WILL BE CREDITED (A) NOT DETECTED Final   Streptococcus agalactiae TEST WILL BE CREDITED (A) NOT DETECTED Final   Streptococcus pneumoniae TEST WILL BE CREDITED (A) NOT DETECTED Final   Streptococcus pyogenes TEST WILL BE CREDITED (A) NOT DETECTED Final   Acinetobacter baumannii TEST WILL BE CREDITED (A) NOT DETECTED Final   Enterobacteriaceae species TEST WILL BE CREDITED (A) NOT DETECTED Final   Enterobacter cloacae complex TEST WILL BE CREDITED (A) NOT DETECTED Final   Escherichia coli TEST WILL BE CREDITED (A) NOT DETECTED Final   Klebsiella oxytoca TEST WILL BE CREDITED (A) NOT DETECTED Final   Klebsiella pneumoniae TEST WILL BE CREDITED (A) NOT DETECTED Final   Proteus species TEST WILL BE CREDITED (A) NOT DETECTED Final   Serratia marcescens TEST WILL BE CREDITED (A) NOT DETECTED Final   Haemophilus influenzae TEST WILL BE CREDITED (A) NOT DETECTED Final   Neisseria meningitidis TEST WILL BE CREDITED (A) NOT DETECTED Final   Pseudomonas aeruginosa TEST WILL BE CREDITED (A) NOT DETECTED Final   Candida albicans TEST WILL BE CREDITED (A) NOT DETECTED Final   Candida glabrata TEST WILL BE CREDITED (A) NOT DETECTED Final   Candida krusei TEST WILL BE CREDITED (A) NOT DETECTED Final   Candida parapsilosis TEST WILL BE CREDITED (A) NOT DETECTED Final   Candida tropicalis  TEST WILL BE CREDITED (A) NOT DETECTED Final  Blood Culture (routine x 2)     Status: Abnormal   Collection Time: 09/14/16  2:58 PM  Result Value Ref Range Status   Specimen Description BLOOD BLOOD LEFT HAND  Final   Special Requests   Final    BOTTLES DRAWN AEROBIC AND ANAEROBIC Blood Culture adequate volume   Culture  Setup Time   Final    GRAM POSITIVE RODS AEROBIC BOTTLE ONLY CRITICAL RESULT CALLED TO, READ BACK BY AND VERIFIED WITH: MATT MCBANE AT 5397 ON 09/15/16 RWW CONFIRMED BY [facility]    Culture (A)  Final    BACILLUS SPECIES Standardized  susceptibility testing for this organism is not available. Performed at [facility], [place] [address], [place], [place] [address]    Report Status 09/17/2016 FINAL  Final  Blood Culture ID Panel (Reflexed)     Status: None   Collection Time: 09/14/16  2:58 PM  Result Value Ref Range Status   Enterococcus species NOT DETECTED NOT DETECTED Final   Listeria monocytogenes NOT DETECTED NOT DETECTED Final   Staphylococcus species NOT DETECTED NOT DETECTED Final   Staphylococcus aureus NOT DETECTED NOT DETECTED Final   Streptococcus species NOT DETECTED NOT DETECTED Final   Streptococcus agalactiae NOT DETECTED NOT DETECTED Final   Streptococcus pneumoniae NOT DETECTED NOT DETECTED Final   Streptococcus pyogenes NOT DETECTED NOT DETECTED Final   Acinetobacter baumannii NOT DETECTED NOT DETECTED Final   Enterobacteriaceae species NOT DETECTED NOT DETECTED Final   Enterobacter cloacae complex NOT DETECTED NOT DETECTED Final   Escherichia coli NOT DETECTED NOT DETECTED Final   Klebsiella oxytoca NOT DETECTED NOT DETECTED Final   Klebsiella pneumoniae NOT DETECTED NOT DETECTED Final   Proteus species NOT DETECTED NOT DETECTED Final   Serratia marcescens NOT DETECTED NOT DETECTED Final   Haemophilus influenzae NOT DETECTED NOT DETECTED Final   Neisseria meningitidis NOT DETECTED NOT DETECTED Final   Pseudomonas aeruginosa NOT DETECTED NOT  DETECTED Final   Candida albicans NOT DETECTED NOT DETECTED Final   Candida glabrata NOT DETECTED NOT DETECTED Final   Candida krusei NOT DETECTED NOT DETECTED Final   Candida parapsilosis NOT DETECTED NOT DETECTED Final   Candida tropicalis NOT DETECTED NOT DETECTED Final  Body fluid culture (includes gram stain)     Status: None   Collection Time: 09/14/16  3:47 PM  Result Value Ref Range Status   Specimen Description PLEURAL  Final   Special Requests PLEURAL  Final   Gram Stain   Final    RARE WBC PRESENT, PREDOMINANTLY MONONUCLEAR NO ORGANISMS SEEN    Culture   Final    NO GROWTH 3 DAYS Performed at [facility], [address], [place], [place] [address]    Report Status 09/18/2016 FINAL  Final  Culture, sputum-assessment     Status: None   Collection Time: 09/14/16  6:32 PM  Result Value Ref Range Status   Specimen Description SPUTUM  Final   Special Requests Immunocompromised  Final   Sputum evaluation THIS SPECIMEN IS ACCEPTABLE FOR SPUTUM CULTURE  Final   Report Status 09/14/2016 FINAL  Final  Culture, respiratory (NON-Expectorated)     Status: None   Collection Time: 09/14/16  6:32 PM  Result Value Ref Range Status   Specimen Description SPUTUM  Final   Special Requests Immunocompromised Reflexed from [id number]  Final   Gram Stain   Final    FEW WBC PRESENT,BOTH PMN AND MONONUCLEAR FEW GRAM NEGATIVE RODS FEW GRAM NEGATIVE COCCI IN PAIRS    Culture   Final    Consistent with normal respiratory flora. Performed at [facility], [place] [address] [place], [place] [address]    Report Status 09/17/2016 FINAL  Final  MRSA PCR Screening     Status: Abnormal   Collection Time: 09/15/16  8:30 PM  Result Value Ref Range Status   MRSA by PCR POSITIVE (A) NEGATIVE Corrected    Comment: MARSHA HATCH  AT 2155 ON 09/15/16 RWW        The GeneXpert MRSA Assay (FDA approved for NASAL specimens only), is one component of a comprehensive MRSA  colonization surveillance  program. It is not intended to diagnose MRSA infection nor to guide or monitor treatment for MRSA infections. CORRECTED ON 06/18 AT 0544: PREVIOUSLY REPORTED AS POSITIVE        The GeneXpert MRSA Assay (FDA approved for NASAL specimens only), is one component of a comprehensive MRSA colonization surveillance program. It is not intended to diagnose MRSA infection  nor to guide or monitor treatment for MRSA infections. RESULT CALLED TO, READ BACK BY AND VERIFIED WITH: MARSHAATCH AT 2155 ON 09/15/16 RWW     Coagulation Studies: No results for input(s): LABPROT, INR in the last 72 hours.  Imaging: Ct Head Wo Contrast  Result Date: 09/19/2016 CLINICAL DATA:  Altered mental status.  Hypoxia. EXAM: CT HEAD WITHOUT CONTRAST TECHNIQUE: Contiguous axial images were obtained from the base of the skull through the vertex without intravenous contrast. COMPARISON:  None. FINDINGS: Brain: There is mild diffuse atrophy. There is no intracranial mass, hemorrhage, extra-axial fluid collection, or midline shift. There is mild small vessel disease in the centra semiovale bilaterally. Elsewhere gray-white compartments appear normal. No evident acute infarct. Vascular: There is no appreciable hyperdense vessel. There is calcification in carotid siphon. Skull: The bony calvarium appears intact. Sinuses/Orbits: There is mucosal thickening in several ethmoid air cells bilaterally. Other visualized paranasal sinuses are clear. Orbits bilaterally appear symmetric. Other: Visualized mastoid air cells are clear. IMPRESSION: Mild atrophy with mild periventricular small vessel disease. No intracranial mass hemorrhage, or extra-axial fluid collection. No acute infarct evident. Foci of arterial vascular calcification noted. Mild ethmoid sinus disease. Electronically Signed   By: Lowella Grip III M.D.   On: 09/19/2016 12:28    Medications:  I have reviewed the patient's current medications. Scheduled: . amLODipine  5 mg  Oral QPM  . aspirin EC  81 mg Oral Daily  . atorvastatin  40 mg Oral Daily  . benzonatate  100 mg Oral TID  . calcium acetate  1,334 mg Oral TID WC  . carvedilol  6.25 mg Oral BID  . doxazosin  8 mg Oral Daily  . epoetin (EPOGEN/PROCRIT) injection  4,000 Units Intravenous Q M,W,F-HD  . heparin subcutaneous  5,000 Units Subcutaneous Q8H  . insulin aspart  0-5 Units Subcutaneous QHS  . insulin aspart  0-9 Units Subcutaneous TID WC  . insulin detemir  30 Units Subcutaneous QHS  . isosorbide mononitrate  30 mg Oral Daily  . lisinopril  20 mg Oral Daily  . mouth rinse  15 mL Mouth Rinse BID  . metroNIDAZOLE  500 mg Oral Q12H  . pantoprazole  40 mg Oral Daily    Assessment/Plan: Patient with some confusion still but overall improving.  Renal function improving.  Will continue to follow clinically.    LOS: 6 days   Alexis Goodell, MD Neurology [phone] 09/20/2016  10:04 AM

## 2016-09-20 NOTE — Progress Notes (Signed)
Pt back on 2L nasal cannula.

## 2016-09-21 LAB — CBC WITH DIFFERENTIAL/PLATELET
Basophils Absolute: 0 10*3/uL (ref 0–0.1)
Basophils Relative: 1 %
Eosinophils Absolute: 0.4 10*3/uL (ref 0–0.7)
Eosinophils Relative: 5 %
HCT: 29.5 % — ABNORMAL LOW (ref 40.0–52.0)
Hemoglobin: 10.3 g/dL — ABNORMAL LOW (ref 13.0–18.0)
LYMPHS ABS: 1.7 10*3/uL (ref 1.0–3.6)
LYMPHS PCT: 21 %
MCH: 30.6 pg (ref 26.0–34.0)
MCHC: 34.7 g/dL (ref 32.0–36.0)
MCV: 88.1 fL (ref 80.0–100.0)
MONO ABS: 0.6 10*3/uL (ref 0.2–1.0)
MONOS PCT: 8 %
Neutro Abs: 5.2 10*3/uL (ref 1.4–6.5)
Neutrophils Relative %: 65 %
PLATELETS: 145 10*3/uL — AB (ref 150–440)
RBC: 3.35 MIL/uL — AB (ref 4.40–5.90)
RDW: 14.2 % (ref 11.5–14.5)
WBC: 7.9 10*3/uL (ref 3.8–10.6)

## 2016-09-21 LAB — COMPREHENSIVE METABOLIC PANEL
ALT: 10 U/L — AB (ref 17–63)
ANION GAP: 10 (ref 5–15)
AST: 18 U/L (ref 15–41)
Albumin: 3.1 g/dL — ABNORMAL LOW (ref 3.5–5.0)
Alkaline Phosphatase: 49 U/L (ref 38–126)
BUN: 42 mg/dL — ABNORMAL HIGH (ref 6–20)
CHLORIDE: 95 mmol/L — AB (ref 101–111)
CO2: 30 mmol/L (ref 22–32)
CREATININE: 5.32 mg/dL — AB (ref 0.61–1.24)
Calcium: 8.3 mg/dL — ABNORMAL LOW (ref 8.9–10.3)
GFR calc non Af Amer: 9 mL/min — ABNORMAL LOW (ref >60)
GFR, EST AFRICAN AMERICAN: 11 mL/min — AB (ref >60)
Glucose, Bld: 171 mg/dL — ABNORMAL HIGH (ref 65–99)
Potassium: 3.5 mmol/L (ref 3.5–5.1)
SODIUM: 135 mmol/L (ref 135–145)
Total Bilirubin: 0.9 mg/dL (ref 0.3–1.2)
Total Protein: 6.6 g/dL (ref 6.5–8.1)

## 2016-09-21 LAB — GLUCOSE, CAPILLARY
GLUCOSE-CAPILLARY: 180 mg/dL — AB (ref 65–99)
GLUCOSE-CAPILLARY: 287 mg/dL — AB (ref 65–99)
Glucose-Capillary: 147 mg/dL — ABNORMAL HIGH (ref 65–99)
Glucose-Capillary: 296 mg/dL — ABNORMAL HIGH (ref 65–99)

## 2016-09-21 MED ORDER — VANCOMYCIN HCL 1000 MG IV SOLR
1250.0000 mg | INTRAVENOUS | Status: DC
  Administered 2016-09-23: 1250 mg via INTRAVENOUS
  Filled 2016-09-21 (×2): qty 1250

## 2016-09-21 NOTE — Clinical Social Work Note (Signed)
Clinical Social Work Assessment  Patient Details  Name: Calvin Good MRN: [id number] Date of Birth: 1941-03-20  Date of referral:  09/21/16               Reason for consult:  Facility Placement                Permission sought to share information with:  Chartered certified accountant granted to share information::  Yes, Verbal Permission Granted  Name::        Agency::     Relationship::     Contact Information:     Housing/Transportation Living arrangements for the past 2 months:  Single Family Home Source of Information:  Patient, Adult Children Patient Interpreter Needed:  None Criminal Activity/Legal Involvement Pertinent to Current Situation/Hospitalization:  No - Comment as needed Significant Relationships:  Adult Children, Community Support Lives with:  Adult Children Do you feel safe going back to the place where you live?  Yes Need for family participation in patient care:  No (Coment)  Care giving concerns:  PT recommendation for STR   Social Worker assessment / plan:  CSW met with the patient and his daughter Sherlyn Hay at bedside to discuss the PT recommendation for STR, discharge planning, and how Medicare pays for STR. The patient gave verbal permission to conduct a bed search. The patient lives with his daughter, [name], and he has dialysis at [facility] MWF with an 11 am chair time. The patient had concerns about length of stay at the SNF and ability to get his regular dialysis. The CSW provided the information, and the patient reported that he was satisfied.  Discharge is pending. CSW will follow up with bed offers as available.  Employment status:  Retired Forensic scientist:  [employer] PT Recommendations:  [place] / Referral to community resources:  [place]  Patient/Family's Response to care:  The patient and his daughter thanked the CSW.  Patient/Family's Understanding of and Emotional  Response to Diagnosis, Current Treatment, and Prognosis:  The patient and his family are in agreement with the discharge plan.  Emotional Assessment Appearance:  Appears stated age Attitude/Demeanor/Rapport:   (Pleasant) Affect (typically observed):  Pleasant, Accepting, Appropriate Orientation:  Oriented to Self, Oriented to Place, Oriented to  Time Alcohol / Substance use:  Never Used Psych involvement (Current and /or in the community):  No (Comment)  Discharge Needs  Concerns to be addressed:  Discharge Planning Concerns, Care Coordination Readmission within the last 30 days:  No Current discharge risk:  Chronically ill Barriers to Discharge:  Continued Medical Work up   [employer], LCSW 09/21/2016, 4:00 PM

## 2016-09-21 NOTE — NC FL2 (Signed)
[place] LEVEL OF CARE SCREENING TOOL     IDENTIFICATION  Patient Name: Calvin Good Birthdate: Apr 16, 1940 Sex: male Admission Date (Current Location): 09/14/2016  [place] and [place] Number:  Engineering geologist and Address:  [facility], [address] [place], [place] [address]      Provider Number: [id number]  Attending Physician Name and Address:  Nicholes Mango, MD  Relative Name and Phone Number:       Current Level of Care: Hospital Recommended Level of Care: [place] Prior Approval Number:    Date Approved/Denied:   PASRR Number: [id number]  Discharge Plan: SNF    Current Diagnoses: Patient Active Problem List   Diagnosis Date Noted  . Acute encephalopathy   . HCAP (healthcare-associated pneumonia) 09/14/2016  . Aneurysm ([place]) 08/28/2016  . 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 Chest tightness 08/20/2016  . GERD (gastroesophageal reflux disease) 08/20/2016  . NSTEMI (non-ST elevated myocardial infarction) ([place]) 08/20/2016  . Onychomycosis of toenail 05/13/2016  . Essential hypertension, benign 04/23/2016  . Hyperlipemia 04/23/2016  . Diabetes ([place]) 04/23/2016  . ESRD on dialysis ([place]) 04/23/2016    Orientation RESPIRATION BLADDER Height & Weight     Self, Time, Situation, Place  O2 (2L o2) Continent Weight: 231 lb 0.7 oz (104.8 kg) Height:  5\' 9"  (175.3 cm)  BEHAVIORAL SYMPTOMS/MOOD NEUROLOGICAL BOWEL NUTRITION STATUS      Continent Diet (Heart Healthy/ESRD diet)  AMBULATORY STATUS COMMUNICATION OF NEEDS Skin   Extensive Assist Verbally Normal                       Personal Care Assistance Level of Assistance  Bathing, Feeding, Dressing Bathing Assistance: Limited assistance Feeding assistance: Independent Dressing Assistance: Limited assistance     Functional Limitations Info             SPECIAL CARE FACTORS FREQUENCY  PT (By licensed PT)     PT Frequency:  Up to 5X per day, 5 days per week              Contractures Contractures Info: Present    Additional Factors Info  Allergies   Allergies Info: Tetracyclines & Related, Morphine           Current Medications (09/21/2016):  This is the current hospital active medication list Current Facility-Administered Medications  Medication Dose Route Frequency Provider Last Rate Last Dose  . acetaminophen (TYLENOL) tablet 650 mg  650 mg Oral Q6H PRN Windsor Goeken, MD   650 mg at 09/18/16 1947   Or  . acetaminophen (TYLENOL) suppository 650 mg  650 mg Rectal Q6H PRN Gaelle Adriance, MD      . amLODipine (NORVASC) tablet 5 mg  5 mg Oral QPM Vernona Peake, MD   5 mg at 09/20/16 1833  . aspirin EC tablet 81 mg  81 mg Oral Daily Nikia Mangino, MD   81 mg at 09/21/16 1005  . atorvastatin (LIPITOR) tablet 40 mg  40 mg Oral Daily Romin Divita, MD   40 mg at 09/20/16 2156  . benzonatate (TESSALON) capsule 100 mg  100 mg Oral TID Gladstone Lighter, MD   100 mg at 09/21/16 1005  . calcium acetate (PHOSLO) capsule 1,334 mg  1,334 mg Oral TID WC Remmy Crass, MD   1,334 mg at 09/21/16 1142  . carvedilol (COREG) tablet 6.25 mg  6.25 mg Oral BID Jo-Ann Johanning, MD   6.25 mg at 09/21/16 1005  . ceftAZIDime (  FORTAZ) 2 gram/50 mL D5W IVPB - DUPLEX  2 g Intravenous Q M,W,F-1800 Leonel Ramsay, MD   Stopped at 09/20/16 1910  . doxazosin (CARDURA) tablet 8 mg  8 mg Oral Daily Fraidy Mccarrick, MD   8 mg at 09/21/16 1005  . epoetin alfa (EPOGEN,PROCRIT) injection 4,000 Units  4,000 Units Intravenous Q M,W,F-HD Murlean Iba, MD   4,000 Units at 09/20/16 1400  . guaiFENesin-dextromethorphan (ROBITUSSIN DM) 100-10 MG/5ML syrup 5 mL  5 mL Oral Q4H PRN Gladstone Lighter, MD   5 mL at 09/15/16 1110  . heparin injection 5,000 Units  5,000 Units Subcutaneous Q8H Pernell Dupre, RPH   5,000 Units at 09/21/16 0557  . insulin aspart (novoLOG) injection 0-5 Units  0-5 Units Subcutaneous QHS Nicholes Mango, MD   2 Units at 09/17/16  2128  . insulin aspart (novoLOG) injection 0-9 Units  0-9 Units Subcutaneous TID WC Aspen Deterding, MD   5 Units at 09/21/16 1143  . insulin detemir (LEVEMIR) injection 30 Units  30 Units Subcutaneous QHS Vaughan Basta, MD   30 Units at 09/19/16 2236  . ipratropium-albuterol (DUONEB) 0.5-2.5 (3) MG/3ML nebulizer solution 3 mL  3 mL Nebulization Q4H PRN Lance Coon, MD   3 mL at 09/18/16 0531  . isosorbide mononitrate (IMDUR) 24 hr tablet 30 mg  30 mg Oral Daily Zerrick Hanssen, MD   30 mg at 09/21/16 1005  . labetalol (NORMODYNE,TRANDATE) injection 5-10 mg  5-10 mg Intravenous Q2H PRN Harrie Foreman, MD   10 mg at 09/18/16 1652  . lisinopril (PRINIVIL,ZESTRIL) tablet 20 mg  20 mg Oral Daily Vennela Jutte, MD   20 mg at 09/21/16 1005  . MEDLINE mouth rinse  15 mL Mouth Rinse BID Jann Milkovich, MD   15 mL at 09/21/16 0000  . metroNIDAZOLE (FLAGYL) tablet 500 mg  500 mg Oral Q12H Leonel Ramsay, MD   500 mg at 09/21/16 1005  . ondansetron (ZOFRAN) tablet 4 mg  4 mg Oral Q6H PRN Deakon Frix, MD       Or  . ondansetron (ZOFRAN) injection 4 mg  4 mg Intravenous Q6H PRN Vahan Wadsworth, MD   4 mg at 09/17/16 1841  . pantoprazole (PROTONIX) EC tablet 40 mg  40 mg Oral Daily Rodel Glaspy, MD   40 mg at 09/21/16 1005  . [START ON 09/23/2016] vancomycin (VANCOCIN) 1,250 mg in sodium chloride 0.9 % 250 mL IVPB  1,250 mg Intravenous Q M,W,F-HD [facility] Form, [facility]         Discharge Medications: Please see discharge summary for a list of discharge medications.  Relevant Imaging Results:  Relevant Lab Results:   Additional Information SS# [id number]  Zettie Pho, LCSW

## 2016-09-21 NOTE — Progress Notes (Signed)
[facility]  ROUNDING NOTE   Subjective:   [name] admitted to [facility] on 09/14/2016 for Shortness of breath [R06.02] Pleural effusion [J90] HCAP (healthcare-associated pneumonia) [J18.9] Acute on chronic respiratory failure with hypoxia ([place]) [J96.21]  Patient found to have right sided pleural effusion. Thoracentesis done this admission. 1060 mL pleural fluid removed.   Doing better today. Sitting up in chair. States appetite is slowly improving      Objective:  Vital signs in last 24 hours:  Temp:  [97.4 F (36.3 C)-98.5 F (36.9 C)] 98.5 F (36.9 C) (06/23 0511) Pulse Rate:  [74-84] 81 (06/23 0511) Resp:  [10-18] 18 (06/23 0511) BP: (98-163)/(39-82) 163/49 (06/23 0511) SpO2:  [94 %-97 %] 94 % (06/23 0511) Weight:  [104.8 kg (231 lb 0.7 oz)] 104.8 kg (231 lb 0.7 oz) (06/22 1245)  Weight change:  Filed Weights   09/16/16 0940 09/18/16 1050 09/20/16 1245  Weight: 109 kg (240 lb 4.8 oz) 105 kg (231 lb 7.7 oz) 104.8 kg (231 lb 0.7 oz)    Intake/Output: I/O last 3 completed shifts: In: 340 [P.O.:240; IV Piggyback:100] Out: 5366 [Other:1357]   Intake/Output this shift:  No intake/output data recorded.  Physical Exam: General: NAD,    Head: Moist oral mucosal membranes  Eyes: Anicteric,    Neck: Supple,  Lungs:  Mild crackles at right  base, Calvin Good O2  Heart: No rub  Abdomen:  Soft, nontender,   Extremities: + peripheral edema.  Neurologic: Alert, follows commands   Access: Left upper arm AVF           Basic Metabolic Panel:  Recent Labs Lab 09/15/16 0510 09/16/16 0450 09/18/16 1200 09/19/16 0418 09/21/16 0510  NA 135 136 130* 136 135  K 3.5 3.5 4.5 4.1 3.5  CL 99* 100* 94* 97* 95*  CO2 28 28 26 31 30   GLUCOSE 260* 98 168* 148* 171*  BUN 39* 49* 62* 45* 42*  CREATININE 4.81* 6.16* 7.61* 6.05* 5.32*  CALCIUM 8.6* 8.8* 8.6* 8.6* 8.3*  PHOS  --   --  4.8*  --   --     Liver Function Tests:  Recent Labs Lab 09/15/16 0510  09/18/16 1200 09/19/16 0418 09/21/16 0510  AST 13*  --  11* 18  ALT 13*  --  11* 10*  ALKPHOS 54  --  43 49  BILITOT 0.9  --  0.8 0.9  PROT 6.5  --  7.0 6.6  ALBUMIN 3.4* 3.3* 3.3* 3.1*   No results for input(s): LIPASE, AMYLASE in the last 168 hours. No results for input(s): AMMONIA in the last 168 hours.  CBC:  Recent Labs Lab 09/14/16 1249 09/15/16 0510 09/16/16 0450 09/18/16 1200 09/19/16 0418 09/21/16 0510  WBC 8.2 10.5 8.6 8.2 8.0 7.9  NEUTROABS 5.8  --   --   --   --  5.2  HGB 10.3* 9.9* 9.3* 8.8* 10.2* 10.3*  HCT 29.4* 29.0* 27.4* 26.5* 30.0* 29.5*  MCV 86.9 87.0 87.6 88.8 89.2 88.1  PLT 175 160 139* 146* 145* 145*    Cardiac Enzymes:  Recent Labs Lab 09/14/16 1249  TROPONINI <0.03    BNP: Invalid input(s): POCBNP  CBG:  Recent Labs Lab 09/20/16 1110 09/20/16 1835 09/20/16 2114 09/21/16 0738 09/21/16 1133  GLUCAP 277* 150* 199* 180* 287*    Microbiology: Results for orders placed or performed during the hospital encounter of 09/14/16  Blood Culture (routine x 2)     Status: Abnormal   Collection Time:  09/14/16  2:53 PM  Result Value Ref Range Status   Specimen Description BLOOD RIGHT AC  Final   Special Requests   Final    BOTTLES DRAWN AEROBIC AND ANAEROBIC Blood Culture adequate volume   Culture  Setup Time   Final    GRAM POSITIVE COCCI AEROBIC BOTTLE ONLY CRITICAL RESULT CALLED TO, READ BACK BY AND VERIFIED WITH: Sheema Hallaji @ 0800 09/15/16 by [facility]    Culture (A)  Final    STAPHYLOCOCCUS SPECIES (COAGULASE NEGATIVE) THE SIGNIFICANCE OF ISOLATING THIS ORGANISM FROM A SINGLE SET OF BLOOD CULTURES WHEN MULTIPLE SETS ARE DRAWN IS UNCERTAIN. PLEASE NOTIFY THE MICROBIOLOGY DEPARTMENT WITHIN ONE WEEK IF SPECIATION AND SENSITIVITIES ARE REQUIRED. Performed at [facility], [place] [address], [place],  [address]    Report Status 09/17/2016 FINAL  Final  Blood Culture ID Panel (Reflexed)     Status: Abnormal   Collection Time:  09/14/16  2:53 PM  Result Value Ref Range Status   Enterococcus species (A) NOT DETECTED Final    THIS TEST WAS ORDERED IN ERROR AND HAS BEEN CREDITED.    Comment: INCORRECT PANEL SET UP. SPECIMEN AGE LIMIT EXCEEDED WHEN ERROR WAS DISCOVERED. CALLED TO CHRISTINE KATSOUDAS 09/16/16 1100. SGD   Listeria monocytogenes TEST WILL BE CREDITED (A) NOT DETECTED Final   Staphylococcus species TEST WILL BE CREDITED (A) NOT DETECTED Final   Staphylococcus aureus TEST WILL BE CREDITED (A) NOT DETECTED Final   Streptococcus species TEST WILL BE CREDITED (A) NOT DETECTED Final   Streptococcus agalactiae TEST WILL BE CREDITED (A) NOT DETECTED Final   Streptococcus pneumoniae TEST WILL BE CREDITED (A) NOT DETECTED Final   Streptococcus pyogenes TEST WILL BE CREDITED (A) NOT DETECTED Final   Acinetobacter baumannii TEST WILL BE CREDITED (A) NOT DETECTED Final   Enterobacteriaceae species TEST WILL BE CREDITED (A) NOT DETECTED Final   Enterobacter cloacae complex TEST WILL BE CREDITED (A) NOT DETECTED Final   Escherichia coli TEST WILL BE CREDITED (A) NOT DETECTED Final   Klebsiella oxytoca TEST WILL BE CREDITED (A) NOT DETECTED Final   Klebsiella pneumoniae TEST WILL BE CREDITED (A) NOT DETECTED Final   Proteus species TEST WILL BE CREDITED (A) NOT DETECTED Final   Serratia marcescens TEST WILL BE CREDITED (A) NOT DETECTED Final   Haemophilus influenzae TEST WILL BE CREDITED (A) NOT DETECTED Final   Neisseria meningitidis TEST WILL BE CREDITED (A) NOT DETECTED Final   Pseudomonas aeruginosa TEST WILL BE CREDITED (A) NOT DETECTED Final   Candida albicans TEST WILL BE CREDITED (A) NOT DETECTED Final   Candida glabrata TEST WILL BE CREDITED (A) NOT DETECTED Final   Candida krusei TEST WILL BE CREDITED (A) NOT DETECTED Final   Candida parapsilosis TEST WILL BE CREDITED (A) NOT DETECTED Final   Candida tropicalis TEST WILL BE CREDITED (A) NOT DETECTED Final  Blood Culture (routine x 2)     Status: Abnormal    Collection Time: 09/14/16  2:58 PM  Result Value Ref Range Status   Specimen Description BLOOD BLOOD LEFT HAND  Final   Special Requests   Final    BOTTLES DRAWN AEROBIC AND ANAEROBIC Blood Culture adequate volume   Culture  Setup Time   Final    GRAM POSITIVE RODS AEROBIC BOTTLE ONLY CRITICAL RESULT CALLED TO, READ BACK BY AND VERIFIED WITH: MATT MCBANE AT 7829 ON 09/15/16 RWW CONFIRMED BY [facility]    Culture (A)  Final    BACILLUS SPECIES Standardized susceptibility testing  for this organism is not available. Performed at [facility], [place] [address], [place], [place] [address]    Report Status 09/17/2016 FINAL  Final  Blood Culture ID Panel (Reflexed)     Status: None   Collection Time: 09/14/16  2:58 PM  Result Value Ref Range Status   Enterococcus species NOT DETECTED NOT DETECTED Final   Listeria monocytogenes NOT DETECTED NOT DETECTED Final   Staphylococcus species NOT DETECTED NOT DETECTED Final   Staphylococcus aureus NOT DETECTED NOT DETECTED Final   Streptococcus species NOT DETECTED NOT DETECTED Final   Streptococcus agalactiae NOT DETECTED NOT DETECTED Final   Streptococcus pneumoniae NOT DETECTED NOT DETECTED Final   Streptococcus pyogenes NOT DETECTED NOT DETECTED Final   Acinetobacter baumannii NOT DETECTED NOT DETECTED Final   Enterobacteriaceae species NOT DETECTED NOT DETECTED Final   Enterobacter cloacae complex NOT DETECTED NOT DETECTED Final   Escherichia coli NOT DETECTED NOT DETECTED Final   Klebsiella oxytoca NOT DETECTED NOT DETECTED Final   Klebsiella pneumoniae NOT DETECTED NOT DETECTED Final   Proteus species NOT DETECTED NOT DETECTED Final   Serratia marcescens NOT DETECTED NOT DETECTED Final   Haemophilus influenzae NOT DETECTED NOT DETECTED Final   Neisseria meningitidis NOT DETECTED NOT DETECTED Final   Pseudomonas aeruginosa NOT DETECTED NOT DETECTED Final   Candida albicans NOT DETECTED NOT DETECTED Final   Candida glabrata NOT DETECTED NOT  DETECTED Final   Candida krusei NOT DETECTED NOT DETECTED Final   Candida parapsilosis NOT DETECTED NOT DETECTED Final   Candida tropicalis NOT DETECTED NOT DETECTED Final  Body fluid culture (includes gram stain)     Status: None   Collection Time: 09/14/16  3:47 PM  Result Value Ref Range Status   Specimen Description PLEURAL  Final   Special Requests PLEURAL  Final   Gram Stain   Final    RARE WBC PRESENT, PREDOMINANTLY MONONUCLEAR NO ORGANISMS SEEN    Culture   Final    NO GROWTH 3 DAYS Performed at [facility], [address] [place], [place] [address]    Report Status 09/18/2016 FINAL  Final  Culture, sputum-assessment     Status: None   Collection Time: 09/14/16  6:32 PM  Result Value Ref Range Status   Specimen Description SPUTUM  Final   Special Requests Immunocompromised  Final   Sputum evaluation THIS SPECIMEN IS ACCEPTABLE FOR SPUTUM CULTURE  Final   Report Status 09/14/2016 FINAL  Final  Culture, respiratory (NON-Expectorated)     Status: None   Collection Time: 09/14/16  6:32 PM  Result Value Ref Range Status   Specimen Description SPUTUM  Final   Special Requests Immunocompromised Reflexed from [id number]  Final   Gram Stain   Final    FEW WBC PRESENT,BOTH PMN AND MONONUCLEAR FEW GRAM NEGATIVE RODS FEW GRAM NEGATIVE COCCI IN PAIRS    Culture   Final    Consistent with normal respiratory flora. Performed at [facility], [place] [address], [place], [place] [address]    Report Status 09/17/2016 FINAL  Final  MRSA PCR Screening     Status: Abnormal   Collection Time: 09/15/16  8:30 PM  Result Value Ref Range Status   MRSA by PCR POSITIVE (A) NEGATIVE Corrected    Comment: MARSHA HATCH  AT 2155 ON 09/15/16 RWW        The GeneXpert MRSA Assay (FDA approved for NASAL specimens only), is one component of a comprehensive MRSA colonization surveillance  program. It is not intended to diagnose MRSA infection nor to guide or monitor treatment for MRSA  infections. CORRECTED ON 06/18 AT 0544: PREVIOUSLY REPORTED AS POSITIVE        The GeneXpert MRSA Assay (FDA approved for NASAL specimens only), is one component of a comprehensive MRSA colonization surveillance program. It is not intended to diagnose MRSA infection  nor to guide or monitor treatment for MRSA infections. RESULT CALLED TO, READ BACK BY AND VERIFIED WITH: MARSHAATCH AT 2155 ON 09/15/16 RWW     Coagulation Studies: No results for input(s): LABPROT, INR in the last 72 hours.  Urinalysis: No results for input(s): COLORURINE, LABSPEC, PHURINE, GLUCOSEU, HGBUR, BILIRUBINUR, KETONESUR, PROTEINUR, UROBILINOGEN, NITRITE, LEUKOCYTESUR in the last 72 hours.  Invalid input(s): APPERANCEUR    Imaging: Ct Head Wo Contrast  Result Date: 09/19/2016 CLINICAL DATA:  Altered mental status.  Hypoxia. EXAM: CT HEAD WITHOUT CONTRAST TECHNIQUE: Contiguous axial images were obtained from the base of the skull through the vertex without intravenous contrast. COMPARISON:  None. FINDINGS: Brain: There is mild diffuse atrophy. There is no intracranial mass, hemorrhage, extra-axial fluid collection, or midline shift. There is mild small vessel disease in the centra semiovale bilaterally. Elsewhere gray-white compartments appear normal. No evident acute infarct. Vascular: There is no appreciable hyperdense vessel. There is calcification in carotid siphon. Skull: The bony calvarium appears intact. Sinuses/Orbits: There is mucosal thickening in several ethmoid air cells bilaterally. Other visualized paranasal sinuses are clear. Orbits bilaterally appear symmetric. Other: Visualized mastoid air cells are clear. IMPRESSION: Mild atrophy with mild periventricular small vessel disease. No intracranial mass hemorrhage, or extra-axial fluid collection. No acute infarct evident. Foci of arterial vascular calcification noted. Mild ethmoid sinus disease. Electronically Signed   By: Lowella Grip III M.D.   On:  09/19/2016 12:28     Medications:   . cefTAZidime-D5W Stopped (09/20/16 1910)  . [START ON 09/23/2016] vancomycin     . amLODipine  5 mg Oral QPM  . aspirin EC  81 mg Oral Daily  . atorvastatin  40 mg Oral Daily  . benzonatate  100 mg Oral TID  . calcium acetate  1,334 mg Oral TID WC  . carvedilol  6.25 mg Oral BID  . doxazosin  8 mg Oral Daily  . epoetin (EPOGEN/PROCRIT) injection  4,000 Units Intravenous Q M,W,F-HD  . heparin subcutaneous  5,000 Units Subcutaneous Q8H  . insulin aspart  0-5 Units Subcutaneous QHS  . insulin aspart  0-9 Units Subcutaneous TID WC  . insulin detemir  30 Units Subcutaneous QHS  . isosorbide mononitrate  30 mg Oral Daily  . lisinopril  20 mg Oral Daily  . mouth rinse  15 mL Mouth Rinse BID  . metroNIDAZOLE  500 mg Oral Q12H  . pantoprazole  40 mg Oral Daily   acetaminophen **OR** acetaminophen, guaiFENesin-dextromethorphan, ipratropium-albuterol, labetalol, ondansetron **OR** ondansetron (ZOFRAN) IV  Assessment/ Plan:  [name] is a 76 y.o. white male with diabetes mellitus type II insulin dependent, hypertension, coronary artery disease, hyperlipidemia, prostate cancer status post prostatectomy, right total knee, appendectomy.   MWF [facility] [place] AVF  1. End Stage Renal Disease MWF:  2.2 L removed with HD last treatment on Wed and 1300 removed on Friday - Next HD on Monday  2.  Anemia of chronic kidney disease: hemoglobin 10.3 - Mircera as outpatient.  - EPO while inpatient  3.  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  Continue good control of blood  sugars  4. Secondary Hyperparathyroidism:   - calcium acetate with meals  5. Acute respiratory failure Pneumonia: with pleural effusion, not consistent with empyema. Status post right thoracentesis 6/16.  - IV Abx as per ID team        LOS: 7 Calvin Good 6/23/201811:52 AM  Late entry

## 2016-09-21 NOTE — Clinical Social Work Placement (Addendum)
   CLINICAL SOCIAL WORK PLACEMENT  NOTE  Date:  09/21/2016  Patient Details  Name: Calvin Good MRN: [id number] Date of Birth: Oct 02, 1940  Clinical Social Work is seeking post-discharge placement for this patient at the [place] level of care (*CSW will initial, date and re-position this form in  chart as items are completed):  Yes   Patient/family provided with [facility] Work Department's list of facilities offering this level of care within the geographic area requested by the patient (or if unable, by the patient's family).  Yes   Patient/family informed of their freedom to choose among providers that offer the needed level of care, that participate in Medicare, Medicaid or managed care program needed by the patient, have an available bed and are willing to accept the patient.  Yes   Patient/family informed of [facility]'s ownership interest in [facility] and [facility], as well as of the fact that they are under no obligation to receive care at these facilities.  PASRR submitted to EDS on 09/21/16     PASRR number received on 09/21/16     Existing PASRR number confirmed on       FL2 transmitted to all facilities in geographic area requested by pt/family on 09/21/16     FL2 transmitted to all facilities within larger geographic area on       Patient informed that his/her managed care company has contracts with or will negotiate with certain facilities, including the following:         09-22-16   Patient/family informed of bed offers received. Evette Cristal, MSW, [place], updated 09-23-16)  Patient chooses bed at  [facility] (Evette Cristal, MSW, [place], updated 09-23-16)     Physician recommends and patient chooses bed at      Patient to be transferred to  [facility] on  09-23-16 Evette Cristal, MSW, [place], updated 09-23-16).  Patient to be transferred to facility by  [facility] Evette Cristal, MSW, [place], updated  09-23-16)     Patient family notified on  09-23-16 of transfer. Evette Cristal, MSW, [place], updated 09-23-16)  Name of family member notified:   Left message on voice mail for Ricki Miller [phone] and Sherlyn Hay Chance [phone] Evette Cristal, MSW, [place], updated 09-23-16)      PHYSICIAN       Additional Comment:    _______________________________________________ Zettie Pho, LCSW 09/21/2016, 4:02 PM   Jones Broom. Norval Morton, MSW, [place]  09/23/2016 1:23 PM Evette Cristal, MSW, [place], updated 09-23-16)

## 2016-09-21 NOTE — Evaluation (Signed)
Physical Therapy Evaluation Patient Details Name: Calvin Good MRN: [id number] DOB: 1940/07/18 Today's Date: 09/21/2016   History of Present Illness  Calvin Good is a 76yo white male who comes to [facility] on 6/16 due to SOB, CXR revealing mild CHF and PNA, 1.5L fluid removed via thoracentesis in ED. Pt was evaluated by PT on 6/19 at his functional baseline, with recommendations for pulmonary rehab, then suffered more respiratory distress/AMS, moved to the CCU on 6/20, consulted on by neuro on 6/21 with anomia, (-) head CT, and EEG findings consistent with metabolic encephalopathy. Today patient remains hypoverbal due to contnued but improved anomia. PMH: Rt TKA, CHF, urinary incontinence s/p PrCA, COPD on O2 x1Y (2LPM rest, 6LPM active at home), ESRD on HD MWF, and DM.   Clinical Impression  Pt admitted with above diagnosis. Pt currently with functional limitations due to the deficits listed below (see "PT Problem List"). Upon entry, the patient is received up in chair with sitter in room. The pt is awake and agreeable to participate. No acute distress noted at this time.The pt is alert, oriented to self, pleasant, conversational, and following simple and multi-step commands consistently. He continues to have mild language deficits and anomia, but language is not limiting. Pt received on and remaining on 2LPM, but increased to 4LPM for mobility assessment per baseline, with noted saturation of >95%, however pt with continued SOB and dizziness with short distance AMB. Functional mobility assessment demonstratessevere weakness, the pt now requiring Max assist physical assistance for transfers, and gait is very unstable with BLE weakness, whereas the patient performs these independently at baseline. The patient is substantially weaker than just 3DA when evaluated by PT at that time. Pt will benefit from skilled PT intervention to increase independence and safety with basic mobility in preparation for discharge to  the venue listed below.       Follow Up Recommendations SNF (Pt is not agreeable to STR; degree of caregiver involvement of daughter is unknown at this time. )    Equipment Recommendations  Rolling walker with 5" wheels    Recommendations for Other Services       Precautions / Restrictions Precautions Precautions: Fall Restrictions Weight Bearing Restrictions: No      Mobility  Bed Mobility               General bed mobility comments: received in chair  Transfers Overall transfer level: Needs assistance Equipment used: 1 person hand held assist Transfers: Sit to/from Stand Sit to Stand: Max assist         General transfer comment: weakness in legs and in BUE, requires heavy lift assist   Ambulation/Gait Ambulation/Gait assistance: Min guard Ambulation Distance (Feet): 25 Feet Assistive device: 1 person hand held assist Gait Pattern/deviations: Wide base of support;Shuffle Gait velocity: decreased Gait velocity interpretation: <1.8 ft/sec, indicative of risk for recurrent falls General Gait Details: AMB to door, but unable to make it back to chair due to worsening dizziness; HR and SpO2 WNL on 4LPM.  Stairs            Wheelchair Mobility    Modified Rankin (Stroke Patients Only)       Balance Overall balance assessment: Needs assistance Sitting-balance support: No upper extremity supported;Feet supported Sitting balance-Leahy Scale: Good     Standing balance support: During functional activity   Standing balance comment: with support on window sill, and minA from PT is able to donn underpants c NA one foot at a time. AMB is very  unstable with heavy HHA required                             Pertinent Vitals/Pain Pain Assessment: No/denies pain    Home Living Family/patient expects to be discharged to:: Private residence Living Arrangements: Children Available Help at Discharge: Family;Available 24 hours/day Type of Home:  House Home Access: Stairs to enter Entrance Stairs-Rails: Chemical engineer of Steps: 4 Home Layout: Two level;Full bath on main level;Able to live on main level with bedroom/bathroom Home Equipment: Kasandra Knudsen - single point;Shower seat;Grab bars - tub/shower;Grab bars - toilet      Prior Function Level of Independence: Needs assistance   Gait / Transfers Assistance Needed: Pt limited to ambulating household distances due to fatigue O2 related (30-50 ft).  Pt does not use AD.  No falls in the past 6 months.  Uses 2L O2 at baseline at rest, upped to 6LPM with activity.  ADL's / Homemaking Assistance Needed: Pt's daughter does the driving, cooking, cleaning.          Hand Dominance   Dominant Hand: Right    Extremity/Trunk Assessment   Upper Extremity Assessment Upper Extremity Assessment: Generalized weakness    Lower Extremity Assessment Lower Extremity Assessment: Generalized weakness       Communication   Communication: Expressive difficulties;No difficulties (some expressive aphasia to anomia over th epast 3 days)  Cognition Arousal/Alertness: Awake/alert Behavior During Therapy: WFL for tasks assessed/performed Overall Cognitive Status: Impaired/Different from baseline                                 General Comments: expressive language deficits, word finding      General Comments      Exercises     Assessment/Plan    PT Assessment Patient needs continued PT services  PT Problem List Decreased strength;Decreased activity tolerance;Decreased balance;Cardiopulmonary status limiting activity;Decreased mobility       PT Treatment Interventions DME instruction;Gait training;Stair training;Functional mobility training;Therapeutic activities;Therapeutic exercise;Balance training;Neuromuscular re-education;Patient/family education    PT Goals (Current goals can be found in the Care Plan section)  Acute Rehab PT Goals Patient Stated  Goal: return to PLOF;  PT Goal Formulation: With patient Time For Goal Achievement: 10/05/16 Potential to Achieve Goals: Fair    Frequency Min 2X/week   Barriers to discharge Inaccessible home environment      Co-evaluation               AM-PAC PT "6 Clicks" Daily Activity  Outcome Measure Difficulty turning over in bed (including adjusting bedclothes, sheets and blankets)?: Total Difficulty moving from lying on back to sitting on the side of the bed? : Total Difficulty sitting down on and standing up from a chair with arms (e.g., wheelchair, bedside commode, etc,.)?: Total Help needed moving to and from a bed to chair (including a wheelchair)?: Total Help needed walking in hospital room?: Total Help needed climbing 3-5 steps with a railing? : Total 6 Click Score: 6    End of Session Equipment Utilized During Treatment: Gait belt;Oxygen Activity Tolerance: Patient limited by fatigue;Treatment limited secondary to medical complications (Comment) (dizziness) Patient left: in chair;with call bell/phone within reach;with nursing/sitter in room Nurse Communication: Mobility status;Other (comment) PT Visit Diagnosis: Muscle weakness (generalized) (M62.81);Difficulty in walking, not elsewhere classified (R26.2)    Time: 1050-1110 PT Time Calculation (min) (ACUTE ONLY): 20 min   Charges:  PT Evaluation $PT Eval Moderate Complexity: 1 Procedure PT Treatments $Therapeutic Activity: 8-22 mins   PT G Codes:        1:33 PM, 10/07/2016 Etta Grandchild, PT, DPT Physical Therapist - [place] [phone] ([place])  [phone] (mobile)   Buccola,Allan C 10-07-16, 1:30 PM

## 2016-09-21 NOTE — Progress Notes (Signed)
PHARMACY CONSULT NOTE FOR:  OUTPATIENT  PARENTERAL ANTIBIOTIC THERAPY (OPAT)  Indication: HCAP/bacteremia Regimen: vancomycin 1250 mg, ceftazidime 2g q HD MWF End date: 09/23/2016  IV antibiotic discharge orders are pended. To discharging provider:  please sign these orders via discharge navigator,  Select New Orders & click on the button choice - Manage This Unsigned Work.     Thank you for allowing pharmacy to be a part of this patient's care.  Calvin Good D 09/21/2016, 7:24 AM

## 2016-09-21 NOTE — Progress Notes (Signed)
[facility] [place] NAME: Calvin Good    MR#:  [id number]  DATE OF BIRTH:  [birth year]  SUBJECTIVE:  CHIEF COMPLAINT:   Chief Complaint  Patient presents with  . Shortness of Breath   - Dialysis patient admitted with shortness of breath and noted to have pleural effusion, that was drained  in the emergency room - thoracentesis.  Patient was seen and examined ,Feeling much better denies any shortness of breath  REVIEW OF SYSTEMS:  Review of Systems  Constitutional: Negative for diaphoresis and malaise/fatigue.  HENT: Negative for congestion, ear discharge, hearing loss and sinus pain.   Eyes: Negative for blurred vision and double vision.  Respiratory: Negative for cough and shortness of breath.   Cardiovascular: Negative for chest pain and palpitations.  Gastrointestinal: Negative for abdominal pain, diarrhea, nausea and vomiting.  Genitourinary: Negative for frequency and urgency.  Skin: Negative for itching and rash.  Neurological: Positive for weakness. Negative for dizziness and headaches.  Psychiatric/Behavioral: Negative for depression and hallucinations.    DRUG ALLERGIES:   Allergies  Allergen Reactions  . Tetracyclines & Related Other (See Comments)    Reaction: unknown happened a long time ago and pt doesn't remember reaction   . Morphine Other (See Comments)    Reaction: confusion    VITALS:  Blood pressure (!) 147/57, pulse 81, temperature 98.4 F (36.9 C), temperature source Oral, resp. rate 16, height 5\' 9"  (1.753 m), weight 104.8 kg (231 lb 0.7 oz), SpO2 97 %.  PHYSICAL EXAMINATION:  Physical Exam  GENERAL:  76 y.o.-year-old patient lying in the bed. Looks critically ill EYES: Pupils equal, round, reactive to light and accommodation. No scleral icterus. Extraocular muscles intact.  HEENT: Head atraumatic, normocephalic. Oropharynx and nasopharynx clear.  NECK:  Supple, no jugular venous distention. No  thyroid enlargement, no tenderness.  LUNGS: Clear to auscultation. No wheezing, no rales or rhonchi  CARDIOVASCULAR: S1, S2 normal. No  rubs, or gallops. 2/6 systolic murmur present ABDOMEN: Soft, nontender, nondistended. Bowel sounds present. No organomegaly or mass.  EXTREMITIES: No pedal edema, cyanosis, or clubbing. Left arm AV fistula NEUROLOGIC: The patient is Awake and alert and oriented 3 Muscle strength 4/5 in all extremities.  Gait not checked.  PSYCHIATRIC: The patient is awake and alert SKIN: No obvious rash, lesion, or ulcer.    LABORATORY PANEL:   CBC  Recent Labs Lab 09/21/16 0510  WBC 7.9  HGB 10.3*  HCT 29.5*  PLT 145*   ------------------------------------------------------------------------------------------------------------------  Chemistries   Recent Labs Lab 09/21/16 0510  NA 135  K 3.5  CL 95*  CO2 30  GLUCOSE 171*  BUN 42*  CREATININE 5.32*  CALCIUM 8.3*  AST 18  ALT 10*  ALKPHOS 49  BILITOT 0.9   ------------------------------------------------------------------------------------------------------------------  Cardiac Enzymes No results for input(s): TROPONINI in the last 168 hours. ------------------------------------------------------------------------------------------------------------------  RADIOLOGY:  No results found.  EKG:   Orders placed or performed during the hospital encounter of 09/14/16  . ED EKG  . 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  # Acute hypoxic respiratory failure-secondary to pleural effusion and also right lower lobe infiltrate. Better today, not on BiPAP -Blood cultures positive bacillus * 1 ; pleural fluid culture negative. Sputum culture  with normal flora - positive MRSA PCR- cont vanc, cefepime per ID recs during hemodialysis for a total of 10 day course stop date is 6/25-Recommended Flagyl to be continued for possible aspiration, which can be discontinued on 6/25 -Patient is status post thoracentesis. Transudate -Follow up with pulmonology and infectious disease prn  # Acute encephalopathy -metabolic Clinically improving CT head negative follow-up with neurology  EEG-is an abnormal electroencephalogram secondary to general background slowing and infrequent triphasic waves.  This finding is most consistent with an encephalopathy  # End-stage renal disease on hemodialysis-on Monday, Wednesday and Friday schedule. -Nephrology following, okay to discharge patient from nephrology standpoint in 1-2 days   # CAD-status post cardiac catheterization last month, has significant RCA disease and medical management was recommended at the time. -Continue aspirin, statin and Coreg.  # hypertension-continue home medications patient on Norvasc, Coreg, Lasix, Imdur, spironolactone and lisinopril.  # diabetes mellitus on Levemir and sliding scale insulin in the hospital.  # DVT prophylaxis-subcutaneous heparin.  Generalized weakness PT evaluation  All the records are reviewed and case discussed with Care Management/Social Worker. Management plans discussed with the patient, family and they are in agreement.  CODE STATUS: Full code  TOTAL TIME TAKING CARE OF THIS PATIENT: 35 minutes.   POSSIBLE D/C IN 2-3 DAYS, DEPENDING ON CLINICAL CONDITION.   Nicholes Mango M.D on 09/21/2016 at 1:12 PM  Between 7am to 6pm - Pager - [phone]  After 6pm go to [email or web address] - password [email or web address] [facility]  Office  [phone]  CC: Primary care physician; [name], MD

## 2016-09-21 NOTE — Consult Note (Signed)
Pharmacy Antibiotic Note  Calvin Good is a 76 y.o. male admitted on 09/14/2016 with pneumonia.  Pharmacy has been consulted for vancomycin and ceftazidime dosing.  Plan: Increase vancomycin to 1250 mg iv q HD for the duration of therapy (10 days per ID).  Continue ceftazidime  2g q HD (MWF)  Height: 5\' 9"  (175.3 cm) Weight: 231 lb 0.7 oz (104.8 kg) IBW/kg (Calculated) : 70.7  Temp (24hrs), Avg:98.2 F (36.8 C), Min:97.4 F (36.3 C), Max:98.5 F (36.9 C)   Recent Labs Lab 09/15/16 0510 09/16/16 0450 09/18/16 1200 09/19/16 0418 09/20/16 0456 09/21/16 0510  WBC 10.5 8.6 8.2 8.0  --  7.9  CREATININE 4.81* 6.16* 7.61* 6.05*  --  5.32*  VANCORANDOM  --   --   --   --  14  --     Estimated Creatinine Clearance: 13.6 mL/min (A) (by C-G formula based on SCr of 5.32 mg/dL (H)).    Allergies  Allergen Reactions  . Tetracyclines & Related Other (See Comments)    Reaction: unknown happened a long time ago and pt doesn't remember reaction   . Morphine Other (See Comments)    Reaction: confusion    Antimicrobials this admission: 6/16 vancomycin  >>  6/16 Cefepime >> 6/19 6/19 Ceftazidime >> Metronidazole 6/21 >>   Dose adjustments this admission: Vancomycin 1000 mg q HD >> 1250 mg qHD  Microbiology results: 6/16 BCx: bacillus species/CNS/NGTD 6/16 Sputum: normal flora  6/17 MRSA PCR: positive  Thank you for allowing pharmacy to be a part of this patient's care.  Ulice Dash, PharmD Clinical Pharmacist 09/21/2016 7:08 AM

## 2016-09-22 LAB — GLUCOSE, CAPILLARY
GLUCOSE-CAPILLARY: 244 mg/dL — AB (ref 65–99)
GLUCOSE-CAPILLARY: 261 mg/dL — AB (ref 65–99)
Glucose-Capillary: 239 mg/dL — ABNORMAL HIGH (ref 65–99)
Glucose-Capillary: 222 mg/dL — ABNORMAL HIGH (ref 65–99)

## 2016-09-22 NOTE — Progress Notes (Signed)
[facility] [place] NAME: Calvin Good    MR#:  [id number]  DATE OF BIRTH:  Sep 09, 1940  SUBJECTIVE:  CHIEF COMPLAINT:   Chief Complaint  Patient presents with  . Shortness of Breath   - Dialysis patient admitted with shortness of breath and noted to have pleural effusion, that was drained  in the emergency room - thoracentesis.  Patient was seen and examined ,Feeling much better denies any shortness of breath,No other complaints today family at bedside  REVIEW OF SYSTEMS:  Review of Systems  Constitutional: Negative for diaphoresis and malaise/fatigue.  HENT: Negative for congestion, ear discharge, hearing loss and sinus pain.   Eyes: Negative for blurred vision and double vision.  Respiratory: Negative for cough and shortness of breath.   Cardiovascular: Negative for chest pain and palpitations.  Gastrointestinal: Negative for abdominal pain, diarrhea, nausea and vomiting.  Genitourinary: Negative for frequency and urgency.  Skin: Negative for itching and rash.  Neurological: Positive for weakness. Negative for dizziness and headaches.  Psychiatric/Behavioral: Negative for depression and hallucinations.    DRUG ALLERGIES:   Allergies  Allergen Reactions  . Tetracyclines & Related Other (See Comments)    Reaction: unknown happened a long time ago and pt doesn't remember reaction   . Morphine Other (See Comments)    Reaction: confusion    VITALS:  Blood pressure (!) 136/41, pulse 80, temperature 98.6 F (37 C), temperature source Oral, resp. rate 19, height 5\' 9"  (1.753 m), weight 104.8 kg (231 lb 0.7 oz), SpO2 92 %.  PHYSICAL EXAMINATION:  Physical Exam  GENERAL:  76 y.o.-year-old patient lying in the bed. Looks critically ill EYES: Pupils equal, round, reactive to light and accommodation. No scleral icterus. Extraocular muscles intact.  HEENT: Head atraumatic, normocephalic. Oropharynx and nasopharynx clear.  NECK:   Supple, no jugular venous distention. No thyroid enlargement, no tenderness.  LUNGS: Clear to auscultation. No wheezing, no rales or rhonchi  CARDIOVASCULAR: S1, S2 normal. No  rubs, or gallops. 2/6 systolic murmur present ABDOMEN: Soft, nontender, nondistended. Bowel sounds present. No organomegaly or mass.  EXTREMITIES: No pedal edema, cyanosis, or clubbing. Left arm AV fistula NEUROLOGIC: The patient is Awake and alert and oriented 3 Muscle strength 4/5 in all extremities.  Gait not checked.  PSYCHIATRIC: The patient is awake and alert SKIN: No obvious rash, lesion, or ulcer.    LABORATORY PANEL:   CBC  Recent Labs Lab 09/21/16 0510  WBC 7.9  HGB 10.3*  HCT 29.5*  PLT 145*   ------------------------------------------------------------------------------------------------------------------  Chemistries   Recent Labs Lab 09/21/16 0510  NA 135  K 3.5  CL 95*  CO2 30  GLUCOSE 171*  BUN 42*  CREATININE 5.32*  CALCIUM 8.3*  AST 18  ALT 10*  ALKPHOS 49  BILITOT 0.9   ------------------------------------------------------------------------------------------------------------------  Cardiac Enzymes No results for input(s): TROPONINI in the last 168 hours. ------------------------------------------------------------------------------------------------------------------  RADIOLOGY:  No results found.  EKG:   Orders placed or performed during the hospital encounter of 09/14/16  . ED EKG  . 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  # Acute hypoxic respiratory failure-secondary to pleural effusion and also right lower lobe infiltrate. Clinically improving  not on BiPAP -Blood cultures positive bacillus *  1 ; pleural fluid culture negative. Sputum culture with normal flora - positive MRSA PCR- cont vanc, cefepime per ID recs during hemodialysis for a total of 10 day course stop date is 6/25-Recommended Flagyl to be continued for possible aspiration, which can be discontinued on 6/25 -Patient is status post thoracentesis. Transudate -Follow up with pulmonology and infectious disease prn  # Acute encephalopathy -metabolic Clinically improving CT head negative follow-up with neurology  EEG-is an abnormal electroencephalogram secondary to general background slowing and infrequent triphasic waves.  This finding is most consistent with an encephalopathy  # End-stage renal disease on hemodialysis-on Monday, Wednesday and Friday schedule. -Nephrology following, okay to discharge patient from nephrology standpoint in 1-2 days   # CAD-status post cardiac catheterization last month, has significant RCA disease and medical management was recommended at the time. -Continue aspirin, statin and Coreg.  # hypertension-continue home medications patient on Norvasc, Coreg, Lasix, Imdur, spironolactone and lisinopril.  # diabetes mellitus on Levemir and sliding scale insulin in the hospital.  # DVT prophylaxis-subcutaneous heparin.  Generalized weakness PT-recommending skilled nursing facility, follow-up with social worker and discharge when bed is available  All the records are reviewed and case discussed with Care Management/Social Worker. Management plans discussed with the patient, family and they are in agreement.  CODE STATUS: Full code  TOTAL TIME TAKING CARE OF THIS PATIENT: 35 minutes.   POSSIBLE D/C IN 1-2 DAYS, DEPENDING ON CLINICAL CONDITION.   Nicholes Mango M.D on 09/22/2016 at 12:33 PM  Between 7am to 6pm - Pager - [phone]  After 6pm go to [email or web address] - password [email or web address] [facility]  Office  [phone]  CC: Primary care physician; [name],  MD

## 2016-09-22 NOTE — Progress Notes (Signed)
[facility]  ROUNDING NOTE   Subjective:   [name] admitted to [facility] on 09/14/2016 for Shortness of breath [R06.02] Pleural effusion [J90] HCAP (healthcare-associated pneumonia) [J18.9] Acute on chronic respiratory failure with hypoxia ([place]) [J96.21]  Patient had right sided pleural effusion. Thoracentesis done this admission. 1060 mL pleural fluid removed.   Doing better today. Sitting up on the side of bed States appetite is slowly improving Appears to be in Good spirits   Objective:  Vital signs in last 24 hours:  Temp:  [97.7 F (36.5 C)-98.4 F (36.9 C)] 97.7 F (36.5 C) (06/24 0918) Pulse Rate:  [76-85] 76 (06/24 0918) Resp:  [16-18] 18 (06/24 0320) BP: (125-154)/(31-57) 130/37 (06/24 0918) SpO2:  [94 %-98 %] 96 % (06/24 0918)  Weight change:  Filed Weights   09/16/16 0940 09/18/16 1050 09/20/16 1245  Weight: 109 kg (240 lb 4.8 oz) 105 kg (231 lb 7.7 oz) 104.8 kg (231 lb 0.7 oz)    Intake/Output: I/O last 3 completed shifts: In: 36 [IV Piggyback:50] Out: 0    Intake/Output this shift:  No intake/output data recorded.  Physical Exam: General: NAD,    Head: Moist oral mucosal membranes  Eyes: Anicteric,    Neck: Supple,  Lungs:  Decreased breath sounds at bases, [place] O2, no crackles  Heart: No rub  Abdomen:  Soft, nontender,   Extremities: + peripheral edema.  Neurologic: Alert, follows commands   Access: Left upper arm AVF           Basic Metabolic Panel:  Recent Labs Lab 09/16/16 0450 09/18/16 1200 09/19/16 0418 09/21/16 0510  NA 136 130* 136 135  K 3.5 4.5 4.1 3.5  CL 100* 94* 97* 95*  CO2 28 26 31 30   GLUCOSE 98 168* 148* 171*  BUN 49* 62* 45* 42*  CREATININE 6.16* 7.61* 6.05* 5.32*  CALCIUM 8.8* 8.6* 8.6* 8.3*  PHOS  --  4.8*  --   --     Liver Function Tests:  Recent Labs Lab 09/18/16 1200 09/19/16 0418 09/21/16 0510  AST  --  11* 18  ALT  --  11* 10*  ALKPHOS  --  43 49  BILITOT  --  0.8 0.9  PROT  --   7.0 6.6  ALBUMIN 3.3* 3.3* 3.1*   No results for input(s): LIPASE, AMYLASE in the last 168 hours. No results for input(s): AMMONIA in the last 168 hours.  CBC:  Recent Labs Lab 09/16/16 0450 09/18/16 1200 09/19/16 0418 09/21/16 0510  WBC 8.6 8.2 8.0 7.9  NEUTROABS  --   --   --  5.2  HGB 9.3* 8.8* 10.2* 10.3*  HCT 27.4* 26.5* 30.0* 29.5*  MCV 87.6 88.8 89.2 88.1  PLT 139* 146* 145* 145*    Cardiac Enzymes: No results for input(s): CKTOTAL, CKMB, CKMBINDEX, TROPONINI in the last 168 hours.  BNP: Invalid input(s): POCBNP  CBG:  Recent Labs Lab 09/21/16 0738 09/21/16 1133 09/21/16 1631 09/21/16 2045 09/22/16 0750  GLUCAP 180* 287* 147* 296* 79*    Microbiology: Results for orders placed or performed during the hospital encounter of 09/14/16  Blood Culture (routine x 2)     Status: Abnormal   Collection Time: 09/14/16  2:53 PM  Result Value Ref Range Status   Specimen Description BLOOD RIGHT [facility]  Final   Special Requests   Final    BOTTLES DRAWN AEROBIC AND ANAEROBIC Blood Culture adequate volume   Culture  Setup Time   Final  GRAM POSITIVE COCCI AEROBIC BOTTLE ONLY CRITICAL RESULT CALLED TO, READ BACK BY AND VERIFIED WITH: Sheema Hallaji @ 0800 09/15/16 by [name]    Culture (A)  Final    STAPHYLOCOCCUS SPECIES (COAGULASE NEGATIVE) THE SIGNIFICANCE OF ISOLATING THIS ORGANISM FROM A SINGLE SET OF BLOOD CULTURES WHEN MULTIPLE SETS ARE DRAWN IS UNCERTAIN. PLEASE NOTIFY THE MICROBIOLOGY DEPARTMENT WITHIN ONE WEEK IF SPECIATION AND SENSITIVITIES ARE REQUIRED. Performed at [facility], [place] [address], [place], [place] [address]    Report Status 09/17/2016 FINAL  Final  Blood Culture ID Panel (Reflexed)     Status: Abnormal   Collection Time: 09/14/16  2:53 PM  Result Value Ref Range Status   Enterococcus species (A) NOT DETECTED Final    THIS TEST WAS ORDERED IN ERROR AND HAS BEEN CREDITED.    Comment: INCORRECT PANEL SET UP. SPECIMEN AGE LIMIT EXCEEDED  WHEN ERROR WAS DISCOVERED. CALLED TO CHRISTINE KATSOUDAS 09/16/16 1100. SGD   Listeria monocytogenes TEST WILL BE CREDITED (A) NOT DETECTED Final   Staphylococcus species TEST WILL BE CREDITED (A) NOT DETECTED Final   Staphylococcus aureus TEST WILL BE CREDITED (A) NOT DETECTED Final   Streptococcus species TEST WILL BE CREDITED (A) NOT DETECTED Final   Streptococcus agalactiae TEST WILL BE CREDITED (A) NOT DETECTED Final   Streptococcus pneumoniae TEST WILL BE CREDITED (A) NOT DETECTED Final   Streptococcus pyogenes TEST WILL BE CREDITED (A) NOT DETECTED Final   Acinetobacter baumannii TEST WILL BE CREDITED (A) NOT DETECTED Final   Enterobacteriaceae species TEST WILL BE CREDITED (A) NOT DETECTED Final   Enterobacter cloacae complex TEST WILL BE CREDITED (A) NOT DETECTED Final   Escherichia coli TEST WILL BE CREDITED (A) NOT DETECTED Final   Klebsiella oxytoca TEST WILL BE CREDITED (A) NOT DETECTED Final   Klebsiella pneumoniae TEST WILL BE CREDITED (A) NOT DETECTED Final   Proteus species TEST WILL BE CREDITED (A) NOT DETECTED Final   Serratia marcescens TEST WILL BE CREDITED (A) NOT DETECTED Final   Haemophilus influenzae TEST WILL BE CREDITED (A) NOT DETECTED Final   Neisseria meningitidis TEST WILL BE CREDITED (A) NOT DETECTED Final   Pseudomonas aeruginosa TEST WILL BE CREDITED (A) NOT DETECTED Final   Candida albicans TEST WILL BE CREDITED (A) NOT DETECTED Final   Candida glabrata TEST WILL BE CREDITED (A) NOT DETECTED Final   Candida krusei TEST WILL BE CREDITED (A) NOT DETECTED Final   Candida parapsilosis TEST WILL BE CREDITED (A) NOT DETECTED Final   Candida tropicalis TEST WILL BE CREDITED (A) NOT DETECTED Final  Blood Culture (routine x 2)     Status: Abnormal   Collection Time: 09/14/16  2:58 PM  Result Value Ref Range Status   Specimen Description BLOOD BLOOD LEFT HAND  Final   Special Requests   Final    BOTTLES DRAWN AEROBIC AND ANAEROBIC Blood Culture adequate volume    Culture  Setup Time   Final    GRAM POSITIVE RODS AEROBIC BOTTLE ONLY CRITICAL RESULT CALLED TO, READ BACK BY AND VERIFIED WITH: MATT MCBANE AT 3212 ON 09/15/16 RWW CONFIRMED BY [facility]    Culture (A)  Final    BACILLUS SPECIES Standardized susceptibility testing for this organism is not available. Performed at [facility], [place] [address], [place], [place] [address]    Report Status 09/17/2016 FINAL  Final  Blood Culture ID Panel (Reflexed)     Status: None   Collection Time: 09/14/16  2:58 PM  Result  Value Ref Range Status   Enterococcus species NOT DETECTED NOT DETECTED Final   Listeria monocytogenes NOT DETECTED NOT DETECTED Final   Staphylococcus species NOT DETECTED NOT DETECTED Final   Staphylococcus aureus NOT DETECTED NOT DETECTED Final   Streptococcus species NOT DETECTED NOT DETECTED Final   Streptococcus agalactiae NOT DETECTED NOT DETECTED Final   Streptococcus pneumoniae NOT DETECTED NOT DETECTED Final   Streptococcus pyogenes NOT DETECTED NOT DETECTED Final   Acinetobacter baumannii NOT DETECTED NOT DETECTED Final   Enterobacteriaceae species NOT DETECTED NOT DETECTED Final   Enterobacter cloacae complex NOT DETECTED NOT DETECTED Final   Escherichia coli NOT DETECTED NOT DETECTED Final   Klebsiella oxytoca NOT DETECTED NOT DETECTED Final   Klebsiella pneumoniae NOT DETECTED NOT DETECTED Final   Proteus species NOT DETECTED NOT DETECTED Final   Serratia marcescens NOT DETECTED NOT DETECTED Final   Haemophilus influenzae NOT DETECTED NOT DETECTED Final   Neisseria meningitidis NOT DETECTED NOT DETECTED Final   Pseudomonas aeruginosa NOT DETECTED NOT DETECTED Final   Candida albicans NOT DETECTED NOT DETECTED Final   Candida glabrata NOT DETECTED NOT DETECTED Final   Candida krusei NOT DETECTED NOT DETECTED Final   Candida parapsilosis NOT DETECTED NOT DETECTED Final   Candida tropicalis NOT DETECTED NOT DETECTED Final  Body fluid culture (includes gram stain)      Status: None   Collection Time: 09/14/16  3:47 PM  Result Value Ref Range Status   Specimen Description PLEURAL  Final   Special Requests PLEURAL  Final   Gram Stain   Final    RARE WBC PRESENT, PREDOMINANTLY MONONUCLEAR NO ORGANISMS SEEN    Culture   Final    NO GROWTH 3 DAYS Performed at [facility], [address], [place], [place] [address]    Report Status 09/18/2016 FINAL  Final  Culture, sputum-assessment     Status: None   Collection Time: 09/14/16  6:32 PM  Result Value Ref Range Status   Specimen Description SPUTUM  Final   Special Requests Immunocompromised  Final   Sputum evaluation THIS SPECIMEN IS ACCEPTABLE FOR SPUTUM CULTURE  Final   Report Status 09/14/2016 FINAL  Final  Culture, respiratory (NON-Expectorated)     Status: None   Collection Time: 09/14/16  6:32 PM  Result Value Ref Range Status   Specimen Description SPUTUM  Final   Special Requests Immunocompromised Reflexed from [id number]  Final   Gram Stain   Final    FEW WBC PRESENT,BOTH PMN AND MONONUCLEAR FEW GRAM NEGATIVE RODS FEW GRAM NEGATIVE COCCI IN PAIRS    Culture   Final    Consistent with normal respiratory flora. Performed at [facility], [place] [address], [place], [place] [address]    Report Status 09/17/2016 FINAL  Final  MRSA PCR Screening     Status: Abnormal   Collection Time: 09/15/16  8:30 PM  Result Value Ref Range Status   MRSA by PCR POSITIVE (A) NEGATIVE Corrected    Comment: MARSHA HATCH  AT 2155 ON 09/15/16 RWW        The GeneXpert MRSA Assay (FDA approved for NASAL specimens only), is one component of a comprehensive MRSA colonization surveillance program. It is not intended to diagnose MRSA infection nor to guide or monitor treatment for MRSA infections. CORRECTED ON 06/18 AT 0544: PREVIOUSLY REPORTED AS POSITIVE        The GeneXpert MRSA Assay (FDA approved for NASAL specimens only), is one component of a comprehensive  MRSA colonization surveillance program. It  is not intended to diagnose MRSA infection  nor to guide or monitor treatment for MRSA infections. RESULT CALLED TO, READ BACK BY AND VERIFIED WITH: MARSHAATCH AT 2155 ON 09/15/16 RWW     Coagulation Studies: No results for input(s): LABPROT, INR in the last 72 hours.  Urinalysis: No results for input(s): COLORURINE, LABSPEC, PHURINE, GLUCOSEU, HGBUR, BILIRUBINUR, KETONESUR, PROTEINUR, UROBILINOGEN, NITRITE, LEUKOCYTESUR in the last 72 hours.  Invalid input(s): APPERANCEUR    Imaging: No results found.   Medications:   . cefTAZidime-D5W Stopped (09/20/16 1910)  . [START ON 09/23/2016] vancomycin     . amLODipine  5 mg Oral QPM  . aspirin EC  81 mg Oral Daily  . atorvastatin  40 mg Oral Daily  . benzonatate  100 mg Oral TID  . calcium acetate  1,334 mg Oral TID WC  . carvedilol  6.25 mg Oral BID  . doxazosin  8 mg Oral Daily  . epoetin (EPOGEN/PROCRIT) injection  4,000 Units Intravenous Q M,W,F-HD  . heparin subcutaneous  5,000 Units Subcutaneous Q8H  . insulin aspart  0-5 Units Subcutaneous QHS  . insulin aspart  0-9 Units Subcutaneous TID WC  . insulin detemir  30 Units Subcutaneous QHS  . isosorbide mononitrate  30 mg Oral Daily  . lisinopril  20 mg Oral Daily  . mouth rinse  15 mL Mouth Rinse BID  . metroNIDAZOLE  500 mg Oral Q12H  . pantoprazole  40 mg Oral Daily   acetaminophen **OR** acetaminophen, guaiFENesin-dextromethorphan, ipratropium-albuterol, labetalol, ondansetron **OR** ondansetron (ZOFRAN) IV  Assessment/ Plan:  [name] is a 76 y.o. white male with diabetes mellitus type II insulin dependent, hypertension, coronary artery disease, hyperlipidemia, prostate cancer status post prostatectomy, right total knee, appendectomy.   MWF [facility]  1. End Stage Renal Disease MWF:  2.2 L removed with HD last treatment on Wed and 1300 removed on Friday - Next HD on Monday  2.  Anemia of chronic kidney disease: hemoglobin 10.3 - [facility] as  outpatient.  - EPO while inpatient  3.  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  Continue Good control of blood sugars  4. Secondary Hyperparathyroidism:   - calcium acetate with meals  5. Acute respiratory failure Pneumonia: with pleural effusion, not consistent with empyema. Status post right thoracentesis 6/16.  - Abx as per ID team        LOS: 8 Calvin Good 6/24/201810:00 AM  Late entry

## 2016-09-22 NOTE — Clinical Social Work Note (Signed)
CSW met with the patient and his daughters at bedside to discuss bed offers. The family and patient chose [facility]. CSW has updated this via the [facility]. CSW will continue to follow to facilitate discharge. Patient will need EMS for transport due to oxygen needs.  Santiago Bumpers, MSW, [place] Presser [phone]

## 2016-09-22 NOTE — Plan of Care (Signed)
Problem: Safety: Goal: Ability to remain free from injury will improve Outcome: Progressing Patient will remain free of injury and falls. Falls precautions in place.  Problem: Pain Managment: Goal: General experience of comfort will improve Patient will be free from pain and will be managed with PRN pain medication per MD orders.  Problem: Skin Integrity: Goal: Risk for impaired skin integrity will decrease Outcome: Progressing Skin will remain clean, dry and intact. No new skin breakdown during the shift

## 2016-09-22 NOTE — Plan of Care (Signed)
Problem: Education: Goal: Knowledge of [facility] Education information/materials will improve Outcome: Progressing Will continue current plan of care. Patient to be discharged tomorrow to SNF pending MD orders. Patient schedule for hemodialysis tomorrow.  Problem: Safety: Goal: Ability to remain free from injury will improve Outcome: Not Progressing Patient will be free from injury and falls during the shift. Bed in low position, bed alarm, video monitoring, call be in reach.  Problem: Pain Managment: Goal: General experience of comfort will improve Outcome: Progressing Patient will be free from pain.Pain will be managed with PRN  Pain medication per MD orders.  Problem: Skin Integrity: Goal: Risk for impaired skin integrity will decrease Outcome: Progressing Patiient skin will remain clean, dry and intact, no new skin break down during the shift.

## 2016-09-23 ENCOUNTER — Encounter
Admission: RE | Admit: 2016-09-23 | Discharge: 2016-09-23 | Disposition: A | Discharge status: Home / Self Care | Payer: Medicare Other | Source: Ambulatory Visit | Attending: Internal Medicine | Admitting: Internal Medicine

## 2016-09-23 LAB — GLUCOSE, CAPILLARY
Glucose-Capillary: 171 mg/dL — ABNORMAL HIGH (ref 65–99)
Glucose-Capillary: 246 mg/dL — ABNORMAL HIGH (ref 65–99)

## 2016-09-23 MED ORDER — IPRATROPIUM-ALBUTEROL 0.5-2.5 (3) MG/3ML IN SOLN: 3.0000 mL | Freq: Four times a day (QID) | RESPIRATORY_TRACT | Status: DC | PRN

## 2016-09-23 MED ORDER — VANCOMYCIN IV (FOR PTA / DISCHARGE USE ONLY): 1250.0000 mg | INTRAVENOUS | 0 refills | Status: DC

## 2016-09-23 MED ORDER — INSULIN ASPART 100 UNIT/ML ~~LOC~~ SOLN: SUBCUTANEOUS | 3 refills | Status: DC

## 2016-09-23 MED ORDER — ACETAMINOPHEN 325 MG PO TABS: 650.0000 mg | ORAL_TABLET | Freq: Four times a day (QID) | ORAL | Status: DC | PRN

## 2016-09-23 MED ORDER — INSULIN ASPART 100 UNIT/ML ~~LOC~~ SOLN: 0.0000 [IU] | Freq: Three times a day (TID) | SUBCUTANEOUS | 11 refills | Status: DC

## 2016-09-23 MED ORDER — CEFTAZIDIME IV (FOR PTA / DISCHARGE USE ONLY): 2.0000 g | INTRAVENOUS | 0 refills | Status: DC

## 2016-09-23 MED ORDER — INSULIN ASPART 100 UNIT/ML ~~LOC~~ SOLN: 0.0000 [IU] | Freq: Three times a day (TID) | SUBCUTANEOUS | Status: DC

## 2016-09-23 MED ORDER — ONDANSETRON HCL 4 MG PO TABS: 4.0000 mg | ORAL_TABLET | Freq: Four times a day (QID) | ORAL | 0 refills | Status: DC | PRN

## 2016-09-23 MED ORDER — INSULIN ASPART 100 UNIT/ML ~~LOC~~ SOLN: 0.0000 [IU] | Freq: Every day | SUBCUTANEOUS | 11 refills | Status: DC

## 2016-09-23 MED ORDER — INSULIN DETEMIR 100 UNIT/ML ~~LOC~~ SOLN: 30.0000 [IU] | Freq: Every day | SUBCUTANEOUS | 11 refills | Status: DC

## 2016-09-23 MED ORDER — GUAIFENESIN-DM 100-10 MG/5ML PO SYRP: 5.0000 mL | ORAL_SOLUTION | ORAL | 0 refills | Status: DC | PRN

## 2016-09-23 MED ORDER — METRONIDAZOLE 500 MG PO TABS: 500.0000 mg | ORAL_TABLET | Freq: Two times a day (BID) | ORAL | 0 refills | Status: DC

## 2016-09-23 MED ORDER — INSULIN ASPART 100 UNIT/ML ~~LOC~~ SOLN: 0.0000 [IU] | Freq: Every day | SUBCUTANEOUS | Status: DC

## 2016-09-23 NOTE — Progress Notes (Signed)
Pt A&O, not longer confused or impulsive, this RN discontinued the tele sitter. Bed alarm on pt educated to call before getting up. Pt is also in the room closest to the nurse's station. Will continue to monitor.

## 2016-09-23 NOTE — Care Management Important Message (Signed)
Important Message  Patient Details  Name: Calvin Good MRN: [id number] Date of Birth: 28-Jul-1940   Medicare Important Message Given:  Yes    Shelbie Ammons, RN 09/23/2016, 11:40 AM

## 2016-09-23 NOTE — Plan of Care (Signed)
Problem: Pain Managment: Goal: General experience of comfort will improve Outcome: Progressing No complaints of pain this shift, will continue to monitor.  Problem: Tissue Perfusion: Goal: Risk factors for ineffective tissue perfusion will decrease Outcome: Completed/Met Date Met: 09/23/16 Heparin subcutaneously for VTE  Problem: Activity: Goal: Risk for activity intolerance will decrease Outcome: Progressing Up out of bed to chair with 1 assist and walker, tolerated well

## 2016-09-23 NOTE — Progress Notes (Signed)
CH received an OR for PT needing information on AD. PT was informed that he had a AD on file per the PT's chart. [place] prayed with PT.    09/23/16 1315  Clinical Encounter Type  Visited With Patient  Visit Type Initial  Referral From Nurse  Consult/Referral To Chaplain  Spiritual Encounters  Spiritual Needs Prayer

## 2016-09-23 NOTE — Clinical Social Work Note (Signed)
CSW contacted both daughters of patient and left message on their voicemail that patient will be discharging to [facility] after dialysis today.  Patient to be d/c'ed today to [facility].  Patient and family agreeable to plans will transport via ems RN to call report room [phone].  Evette Cristal, MSW, [place]

## 2016-09-23 NOTE — Progress Notes (Signed)
[facility]  ROUNDING NOTE   Subjective:  Patient resting comfortably in bed at the moment. He is due for hemodialysis today. Breathing comfortably at the moment.  Objective:  Vital signs in last 24 hours:  Temp:  [98 F (36.7 C)-98.6 F (37 C)] 98 F (36.7 C) (06/25 0734) Pulse Rate:  [72-80] 72 (06/25 0734) Resp:  [16-19] 18 (06/25 0734) BP: (136-166)/(38-60) 166/49 (06/25 0734) SpO2:  [92 %-99 %] 94 % (06/25 0734)  Weight change:  Filed Weights   09/16/16 0940 09/18/16 1050 09/20/16 1245  Weight: 109 kg (240 lb 4.8 oz) 105 kg (231 lb 7.7 oz) 104.8 kg (231 lb 0.7 oz)    Intake/Output: No intake/output data recorded.   Intake/Output this shift:  No intake/output data recorded.  Physical Exam: General: NAD  Head: Moist oral mucosal membranes  Eyes: Anicteric  Neck: Supple  Lungs:  Decreased breath sounds at bases, [place] O2, no crackles  Heart: S1S2 no rubs  Abdomen:  Soft, nontender, bowel sounds present  Extremities: + peripheral edema.  Neurologic: Alert, follows commands   Access: Left upper arm AVF           Basic Metabolic Panel:  Recent Labs Lab 09/18/16 1200 09/19/16 0418 09/21/16 0510  NA 130* 136 135  K 4.5 4.1 3.5  CL 94* 97* 95*  CO2 26 31 30   GLUCOSE 168* 148* 171*  BUN 62* 45* 42*  CREATININE 7.61* 6.05* 5.32*  CALCIUM 8.6* 8.6* 8.3*  PHOS 4.8*  --   --     Liver Function Tests:  Recent Labs Lab 09/18/16 1200 09/19/16 0418 09/21/16 0510  AST  --  11* 18  ALT  --  11* 10*  ALKPHOS  --  43 49  BILITOT  --  0.8 0.9  PROT  --  7.0 6.6  ALBUMIN 3.3* 3.3* 3.1*   No results for input(s): LIPASE, AMYLASE in the last 168 hours. No results for input(s): AMMONIA in the last 168 hours.  CBC:  Recent Labs Lab 09/18/16 1200 09/19/16 0418 09/21/16 0510  WBC 8.2 8.0 7.9  NEUTROABS  --   --  5.2  HGB 8.8* 10.2* 10.3*  HCT 26.5* 30.0* 29.5*  MCV 88.8 89.2 88.1  PLT 146* 145* 145*    Cardiac Enzymes: No results for  input(s): CKTOTAL, CKMB, CKMBINDEX, TROPONINI in the last 168 hours.  BNP: Invalid input(s): POCBNP  CBG:  Recent Labs Lab 09/22/16 0750 09/22/16 1158 09/22/16 1655 09/22/16 2118 09/23/16 0735  GLUCAP 244* 261* 239* 222* 171*    Microbiology: Results for orders placed or performed during the hospital encounter of 09/14/16  Blood Culture (routine x 2)     Status: Abnormal   Collection Time: 09/14/16  2:53 PM  Result Value Ref Range Status   Specimen Description BLOOD RIGHT [facility]  Final   Special Requests   Final    BOTTLES DRAWN AEROBIC AND ANAEROBIC Blood Culture adequate volume   Culture  Setup Time   Final    GRAM POSITIVE COCCI AEROBIC BOTTLE ONLY CRITICAL RESULT CALLED TO, READ BACK BY AND VERIFIED WITH: Sheema Hallaji @ 0800 09/15/16 by [facility]    Culture (A)  Final    STAPHYLOCOCCUS SPECIES (COAGULASE NEGATIVE) THE SIGNIFICANCE OF ISOLATING THIS ORGANISM FROM A SINGLE SET OF BLOOD CULTURES WHEN MULTIPLE SETS ARE DRAWN IS UNCERTAIN. PLEASE NOTIFY THE MICROBIOLOGY DEPARTMENT WITHIN ONE WEEK IF SPECIATION AND SENSITIVITIES ARE REQUIRED. Performed at [facility], [place],  [place] [address]    Report Status 09/17/2016 FINAL  Final  Blood Culture ID Panel (Reflexed)     Status: Abnormal   Collection Time: 09/14/16  2:53 PM  Result Value Ref Range Status   Enterococcus species (A) NOT DETECTED Final    THIS TEST WAS ORDERED IN ERROR AND HAS BEEN CREDITED.    Comment: INCORRECT PANEL SET UP. SPECIMEN AGE LIMIT EXCEEDED WHEN ERROR WAS DISCOVERED. CALLED TO CHRISTINE KATSOUDAS 09/16/16 1100. SGD   Listeria monocytogenes TEST WILL BE CREDITED (A) NOT DETECTED Final   Staphylococcus species TEST WILL BE CREDITED (A) NOT DETECTED Final   Staphylococcus aureus TEST WILL BE CREDITED (A) NOT DETECTED Final   Streptococcus species TEST WILL BE CREDITED (A) NOT DETECTED Final   Streptococcus agalactiae TEST WILL BE CREDITED (A) NOT DETECTED Final   Streptococcus  pneumoniae TEST WILL BE CREDITED (A) NOT DETECTED Final   Streptococcus pyogenes TEST WILL BE CREDITED (A) NOT DETECTED Final   Acinetobacter baumannii TEST WILL BE CREDITED (A) NOT DETECTED Final   Enterobacteriaceae species TEST WILL BE CREDITED (A) NOT DETECTED Final   Enterobacter cloacae complex TEST WILL BE CREDITED (A) NOT DETECTED Final   Escherichia coli TEST WILL BE CREDITED (A) NOT DETECTED Final   Klebsiella oxytoca TEST WILL BE CREDITED (A) NOT DETECTED Final   Klebsiella pneumoniae TEST WILL BE CREDITED (A) NOT DETECTED Final   Proteus species TEST WILL BE CREDITED (A) NOT DETECTED Final   Serratia marcescens TEST WILL BE CREDITED (A) NOT DETECTED Final   Haemophilus influenzae TEST WILL BE CREDITED (A) NOT DETECTED Final   Neisseria meningitidis TEST WILL BE CREDITED (A) NOT DETECTED Final   Pseudomonas aeruginosa TEST WILL BE CREDITED (A) NOT DETECTED Final   Candida albicans TEST WILL BE CREDITED (A) NOT DETECTED Final   Candida glabrata TEST WILL BE CREDITED (A) NOT DETECTED Final   Candida krusei TEST WILL BE CREDITED (A) NOT DETECTED Final   Candida parapsilosis TEST WILL BE CREDITED (A) NOT DETECTED Final   Candida tropicalis TEST WILL BE CREDITED (A) NOT DETECTED Final  Blood Culture (routine x 2)     Status: Abnormal   Collection Time: 09/14/16  2:58 PM  Result Value Ref Range Status   Specimen Description BLOOD BLOOD LEFT HAND  Final   Special Requests   Final    BOTTLES DRAWN AEROBIC AND ANAEROBIC Blood Culture adequate volume   Culture  Setup Time   Final    GRAM POSITIVE RODS AEROBIC BOTTLE ONLY CRITICAL RESULT CALLED TO, READ BACK BY AND VERIFIED WITH: MATT MCBANE AT 2376 ON 09/15/16 RWW CONFIRMED BY [facility]    Culture (A)  Final    BACILLUS SPECIES Standardized susceptibility testing for this organism is not available. Performed at [facility], [place] [address] [place],  [address]    Report Status 09/17/2016 FINAL  Final  Blood Culture ID Panel  (Reflexed)     Status: None   Collection Time: 09/14/16  2:58 PM  Result Value Ref Range Status   Enterococcus species NOT DETECTED NOT DETECTED Final   Listeria monocytogenes NOT DETECTED NOT DETECTED Final   Staphylococcus species NOT DETECTED NOT DETECTED Final   Staphylococcus aureus NOT DETECTED NOT DETECTED Final   Streptococcus species NOT DETECTED NOT DETECTED Final   Streptococcus agalactiae NOT DETECTED NOT DETECTED Final   Streptococcus pneumoniae NOT DETECTED NOT DETECTED Final   Streptococcus pyogenes NOT DETECTED NOT DETECTED Final   Acinetobacter baumannii NOT DETECTED  NOT DETECTED Final   Enterobacteriaceae species NOT DETECTED NOT DETECTED Final   Enterobacter cloacae complex NOT DETECTED NOT DETECTED Final   Escherichia coli NOT DETECTED NOT DETECTED Final   Klebsiella oxytoca NOT DETECTED NOT DETECTED Final   Klebsiella pneumoniae NOT DETECTED NOT DETECTED Final   Proteus species NOT DETECTED NOT DETECTED Final   Serratia marcescens NOT DETECTED NOT DETECTED Final   Haemophilus influenzae NOT DETECTED NOT DETECTED Final   Neisseria meningitidis NOT DETECTED NOT DETECTED Final   Pseudomonas aeruginosa NOT DETECTED NOT DETECTED Final   Candida albicans NOT DETECTED NOT DETECTED Final   Candida glabrata NOT DETECTED NOT DETECTED Final   Candida krusei NOT DETECTED NOT DETECTED Final   Candida parapsilosis NOT DETECTED NOT DETECTED Final   Candida tropicalis NOT DETECTED NOT DETECTED Final  Body fluid culture (includes gram stain)     Status: None   Collection Time: 09/14/16  3:47 PM  Result Value Ref Range Status   Specimen Description PLEURAL  Final   Special Requests PLEURAL  Final   Gram Stain   Final    RARE WBC PRESENT, PREDOMINANTLY MONONUCLEAR NO ORGANISMS SEEN    Culture   Final    NO GROWTH 3 DAYS Performed at [facility], [address] [place], [place] [address]    Report Status 09/18/2016 FINAL  Final  Culture, sputum-assessment     Status:  None   Collection Time: 09/14/16  6:32 PM  Result Value Ref Range Status   Specimen Description SPUTUM  Final   Special Requests Immunocompromised  Final   Sputum evaluation THIS SPECIMEN IS ACCEPTABLE FOR SPUTUM CULTURE  Final   Report Status 09/14/2016 FINAL  Final  Culture, respiratory (NON-Expectorated)     Status: None   Collection Time: 09/14/16  6:32 PM  Result Value Ref Range Status   Specimen Description SPUTUM  Final   Special Requests Immunocompromised Reflexed from [id number]  Final   Gram Stain   Final    FEW WBC PRESENT,BOTH PMN AND MONONUCLEAR FEW GRAM NEGATIVE RODS FEW GRAM NEGATIVE COCCI IN PAIRS    Culture   Final    Consistent with normal respiratory flora. Performed at [facility], [place] [address] [place], [place] [address]    Report Status 09/17/2016 FINAL  Final  MRSA PCR Screening     Status: Abnormal   Collection Time: 09/15/16  8:30 PM  Result Value Ref Range Status   MRSA by PCR POSITIVE (A) NEGATIVE Corrected    Comment: MARSHA HATCH  AT 2155 ON 09/15/16 RWW        The GeneXpert MRSA Assay (FDA approved for NASAL specimens only), is one component of a comprehensive MRSA colonization surveillance program. It is not intended to diagnose MRSA infection nor to guide or monitor treatment for MRSA infections. CORRECTED ON 06/18 AT 0544: PREVIOUSLY REPORTED AS POSITIVE        The GeneXpert MRSA Assay (FDA approved for NASAL specimens only), is one component of a comprehensive MRSA colonization surveillance program. It is not intended to diagnose MRSA infection  nor to guide or monitor treatment for MRSA infections. RESULT CALLED TO, READ BACK BY AND VERIFIED WITH: MARSHAATCH AT 2155 ON 09/15/16 RWW     Coagulation Studies: No results for input(s): LABPROT, INR in the last 72 hours.  Urinalysis: No results for input(s): COLORURINE, LABSPEC, PHURINE, GLUCOSEU, HGBUR, BILIRUBINUR, KETONESUR, PROTEINUR, UROBILINOGEN, NITRITE, LEUKOCYTESUR in the last 72  hours.  Invalid input(s): APPERANCEUR  Imaging: No results found.   Medications:   . cefTAZidime-D5W Stopped (09/20/16 1910)  . vancomycin     . amLODipine  5 mg Oral QPM  . aspirin EC  81 mg Oral Daily  . atorvastatin  40 mg Oral Daily  . benzonatate  100 mg Oral TID  . calcium acetate  1,334 mg Oral TID WC  . carvedilol  6.25 mg Oral BID  . doxazosin  8 mg Oral Daily  . epoetin (EPOGEN/PROCRIT) injection  4,000 Units Intravenous Q M,W,F-HD  . heparin subcutaneous  5,000 Units Subcutaneous Q8H  . insulin aspart  0-5 Units Subcutaneous QHS  . insulin aspart  0-9 Units Subcutaneous TID WC  . insulin detemir  30 Units Subcutaneous QHS  . isosorbide mononitrate  30 mg Oral Daily  . lisinopril  20 mg Oral Daily  . mouth rinse  15 mL Mouth Rinse BID  . metroNIDAZOLE  500 mg Oral Q12H  . pantoprazole  40 mg Oral Daily   acetaminophen **OR** acetaminophen, guaiFENesin-dextromethorphan, ipratropium-albuterol, labetalol, ondansetron **OR** ondansetron (ZOFRAN) IV  Assessment/ Plan:  [name] is a 76 y.o. white male with diabetes mellitus type II insulin dependent, hypertension, coronary artery disease, hyperlipidemia, prostate cancer status post prostatectomy, right total knee, appendectomy.   MWF [facility] [place] [facility]  1. End Stage Renal Disease MWF: -Asian due for hemodialysis today. Orders have been prepared.  2.  Anemia of chronic kidney disease: Most recent hemoglobin was 10.3 as an inpatient. Continue to monitor CBC.  3.  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  - Management as per hospitalist.  4. Secondary Hyperparathyroidism:   - Continue the patient on calcium acetate 2 tablets by mouth 3 times a day with meals.  5. Acute respiratory failure Pneumonia: with pleural effusion, not consistent with empyema. Status post right thoracentesis 6/16.  - Patient remains on vancomycin at the moment.       LOS: 9 Maleka Contino,  Cerra Eisenhower 6/25/201811:35 AM  Late entry

## 2016-09-23 NOTE — Progress Notes (Signed)
Pre hd assessment  

## 2016-09-23 NOTE — Discharge Summary (Addendum)
[facility] NAME: Calvin Good    MR#:  [id number]  DATE OF BIRTH:  Mar 26, 1941  DATE OF ADMISSION:  09/14/2016 ADMITTING PHYSICIAN: Calvin Mango, MD  DATE OF DISCHARGE:  09/23/16  PRIMARY CARE PHYSICIAN: Calvin Ruths, MD    ADMISSION DIAGNOSIS:  Shortness of breath [R06.02] Pleural effusion [J90] HCAP (healthcare-associated pneumonia) [J18.9] Acute on chronic respiratory failure with hypoxia (Calvin Good) [J96.21]  DISCHARGE DIAGNOSIS:  Active Problems:   HCAP (healthcare-associated pneumonia)   Acute encephalopathy   SECONDARY DIAGNOSIS:   Past Medical History:  Diagnosis Date  . Cancer [facility])    Prostate  . CHF (congestive heart failure) ([place])   . Chronic kidney disease   . Complication of anesthesia    hard to wake up  . COPD (chronic obstructive pulmonary disease) ([place])   . Diabetes mellitus without complication ([place])   . Difficult intubation   . Dyspnea   . GERD (gastroesophageal reflux disease)   . Hyperlipidemia   . Hypertension     HOSPITAL COURSE:   HPI Calvin Good  is a 76 y.o. male with a known history of End-stage renal disease on hemodialysis, diabetes mellitus, hypertension and other medical problems is presenting to the ED with a chief complaint of shortness of breath. Chest x-ray has revealed borderline mild CHF, patchy right lung base opacity within the right and left pleural effusions. Patient had a right-sided thoracentesis in the ED by the ED physician and one and half liters of amber-colored fluid was collected and patient started feeling better. Repeat chest x-ray with no pneumothorax. Hospitalist team is called to admit the patient and patient is started on empiric antibiotics  # Acute hypoxic respiratory failure-secondary to pleural effusion and also right lower lobe infiltrate.   Pulmonology has recommended to discontinue BiPAP as patient clinically improved Outpatient follow-up with  pulmonology for sleep apnea workup -Blood cultures positive bacillus * 1 ; pleural fluid culture negative. Sputum culture with normal flora - positive MRSA PCR- cont vanc, cefepime per ID recs during hemodialysis for a total of 10 day course stop date is 6/25 which will be discontinued after taking the last dose during dialysis today-Recommended Flagyl to be continued for possible aspiration, for a total of  10 days  -Patient is status post thoracentesis. Transudate -Follow up with pulmonology and infectious disease prn  # Acute encephalopathy -metabolic Clinically improving CT head negative follow-up with neurology  EEG-is an abnormal electroencephalogram secondary to general background slowing and infrequent triphasic waves. This finding is most consistent with an encephalopathy  # End-stage renal disease on hemodialysis-on Monday, Wednesday and Friday schedule. -Nephrology following, okay to discharge patient from nephrology standpoint    # CAD-status post cardiac catheterization last month, has significant RCA disease and medical management was recommended at the time. -Continue aspirin, statin and Coreg.  # hypertension-continue home medications patient on Norvasc, Coreg, Lasix, Imdur, spironolactone and lisinopril.  # diabetes mellitus on Levemir 30 units subcutaneously daily basis and sliding scale insulin  NovoLog 40 units 3 times a day with each meal if the patient takes more than 50% of his meal  # DVT prophylaxis-subcutaneous heparin during hospital course  DISCHARGE CONDITIONS:  fair  CONSULTS OBTAINED:  Treatment Team:  Lavonia Dana, MD Leonel Ramsay, MD Wilhelmina Mcardle, MD Catarina Hartshorn, MD Alexis Goodell, MD   PROCEDURES   DRUG ALLERGIES:   Allergies  Allergen Reactions  . Tetracyclines & Related Other (See Comments)  Reaction: unknown happened a long time ago and pt doesn't remember reaction   . Morphine Other (See Comments)     Reaction: confusion    DISCHARGE MEDICATIONS:   Current Discharge Medication List    START taking these medications   Details  acetaminophen (TYLENOL) 325 MG tablet Take 2 tablets (650 mg total) by mouth every 6 (six) hours as needed for mild pain (or Fever >/= 101).    guaiFENesin-dextromethorphan (ROBITUSSIN DM) 100-10 MG/5ML syrup Take 5 mLs by mouth every 4 (four) hours as needed for cough. Qty: 118 mL, Refills: 0    !! insulin aspart (NOVOLOG) 100 UNIT/ML injection Inject 0-5 Units into the skin at bedtime. Qty: 10 mL, Refills: 11    !! insulin aspart (NOVOLOG) 100 UNIT/ML injection Inject 0-9 Units into the skin 3 (three) times daily with meals. CBG < 70: implement hypoglycemia protocol CBG 70 - 120: 0 units CBG 121 - 150: 1 unit CBG 151 - 200: 2 units CBG 201 - 250: 3 units CBG 251 - 300: 5 units CBG 301 - 350: 7 units CBG 351 - 400: 9 units CBG > 400: call MD and obtain STAT lab verification Qty: 10 mL, Refills: 11    !! insulin aspart (NOVOLOG) 100 UNIT/ML injection 4 units 3 times a day with each meal and hold if eating less than 50% Qty: 10 mL, Refills: 3    insulin detemir (LEVEMIR) 100 UNIT/ML injection Inject 0.3 mLs (30 Units total) into the skin at bedtime. Qty: 10 mL, Refills: 11    ipratropium-albuterol (DUONEB) 0.5-2.5 (3) MG/3ML SOLN Take 3 mLs by nebulization every 6 (six) hours as needed. Qty: 360 mL    metroNIDAZOLE (FLAGYL) 500 MG tablet Take 1 tablet (500 mg total) by mouth 2 (two) times daily. Qty: 9 tablet, Refills: 0    ondansetron (ZOFRAN) 4 MG tablet Take 1 tablet (4 mg total) by mouth every 6 (six) hours as needed for nausea. Qty: 20 tablet, Refills: 0     !! - Potential duplicate medications found. Please discuss with provider.    CONTINUE these medications which have NOT CHANGED   Details  amLODipine (NORVASC) 5 MG tablet Take 5 mg by mouth every evening.     aspirin 81 MG EC tablet Chew 81 mg by mouth daily.     atorvastatin  (LIPITOR) 20 MG tablet Take 2 tablets (40 mg total) by mouth daily. Qty: 30 tablet, Refills: 0    calcium acetate (PHOSLO) 667 MG capsule Take 1,334 mg by mouth 3 (three) times daily with meals.     carvedilol (COREG) 6.25 MG tablet Take 6.25 mg by mouth 2 (two) times daily.    doxazosin (CARDURA) 8 MG tablet Take 8 mg by mouth daily.    furosemide (LASIX) 40 MG tablet Take 40 mg by mouth daily.     isosorbide mononitrate (IMDUR) 30 MG 24 hr tablet Take 1 tablet (30 mg total) by mouth daily. Qty: 60 tablet, Refills: 0    lidocaine-prilocaine (EMLA) cream Apply 1 application topically as needed.    lisinopril (PRINIVIL,ZESTRIL) 20 MG tablet Take 20 mg by mouth daily.     omeprazole (PRILOSEC) 20 MG capsule Take 20 mg by mouth 2 (two) times daily before a meal.    spironolactone (ALDACTONE) 25 MG tablet Take 25 mg by mouth daily.      STOP taking these medications     insulin lispro (HUMALOG) 100 UNIT/ML injection      insulin NPH Human (  HUMULIN N,NOVOLIN N) 100 UNIT/ML injection          DISCHARGE INSTRUCTIONS:   Follow-up with primary care physician at the facility in 2-3 days Continue hemodialysis on Monday, Wednesday and Friday and follow-up with nephrology in 2 weeks Follow-up with pulmonology Dr. Mortimer Fries in a week  DIET:  Diabetic diet and Renal diet  DISCHARGE CONDITION:  Stable  ACTIVITY:  Activity as tolerated per PT   OXYGEN:  Home Oxygen: Yes.     Oxygen Delivery: 2 liters/min via [place]   DISCHARGE LOCATION:  nursing home   If you experience worsening of your admission symptoms, develop shortness of breath, life threatening emergency, suicidal or homicidal thoughts you must seek medical attention immediately by calling 911 or calling your MD immediately  if symptoms less severe.  You Must read complete instructions/literature along with all the possible adverse reactions/side effects for all the Medicines you take and that have been prescribed to you. Take  any new Medicines after you have completely understood and accpet all the possible adverse reactions/side effects.   Please note  You were cared for by a hospitalist during your hospital stay. If you have any questions about your discharge medications or the care you received while you were in the hospital after you are discharged, you can call the unit and asked to speak with the hospitalist on call if the hospitalist that took care of you is not available. Once you are discharged, your primary care physician will handle any further medical issues. Please note that NO REFILLS for any discharge medications will be authorized once you are discharged, as it is imperative that you return to your primary care physician (or establish a relationship with a primary care physician if you do not have one) for your aftercare needs so that they can reassess your need for medications and monitor your lab values.     Today  Chief Complaint  Patient presents with  . Shortness of Breath   Patient is more awake and alert and denies any shortness of breath feeling much better. Agreeable to go to rehabilitation center for generalized weakness. Okay to discontinue BiPAP from pulmonology standpoint  ROS:  CONSTITUTIONAL: Denies fevers, chills. Denies any fatigue, weakness.  EYES: Denies blurry vision, double vision, eye pain. EARS, NOSE, THROAT: Denies tinnitus, ear pain, hearing loss. RESPIRATORY: Denies cough, wheeze, shortness of breath.  CARDIOVASCULAR: Denies chest pain, palpitations, edema.  GASTROINTESTINAL: Denies nausea, vomiting, diarrhea, abdominal pain. Denies bright red blood per rectum. GENITOURINARY: Denies dysuria, hematuria. ENDOCRINE: Denies nocturia or thyroid problems. HEMATOLOGIC AND LYMPHATIC: Denies easy bruising or bleeding. SKIN: Denies rash or lesion. MUSCULOSKELETAL: Denies pain in neck, back, shoulder, knees, hips or arthritic symptoms.  NEUROLOGIC: Denies paralysis,  paresthesias.  PSYCHIATRIC: Denies anxiety or depressive symptoms.   VITAL SIGNS:  Blood pressure (!) 142/43, pulse 74, temperature 98.3 F (36.8 C), temperature source Oral, resp. rate 16, height 5\' 9"  (1.753 m), weight 104.8 kg (231 lb 0.7 oz), SpO2 94 %.  I/O:    Intake/Output Summary (Last 24 hours) at 09/23/16 1247 Last data filed at 09/23/16 0458  Gross per 24 hour  Intake                0 ml  Output                0 ml  Net                0 ml    PHYSICAL EXAMINATION:  GENERAL:  76 y.o.-year-old patient lying in the bed with no acute distress.  EYES: Pupils equal, round, reactive to light and accommodation. No scleral icterus. Extraocular muscles intact.  HEENT: Head atraumatic, normocephalic. Oropharynx and nasopharynx clear.  NECK:  Supple, no jugular venous distention. No thyroid enlargement, no tenderness.  LUNGS: Normal breath sounds bilaterally, no wheezing, rales,rhonchi or crepitation. No use of accessory muscles of respiration.  CARDIOVASCULAR: S1, S2 normal. No murmurs, rubs, or gallops.  ABDOMEN: Soft, non-tender, non-distended. Bowel sounds present. No organomegaly or mass.  EXTREMITIES: No pedal edema, cyanosis, or clubbing.  NEUROLOGIC: Cranial nerves II through XII are intact. Muscle strength 5/5 in all extremities. Sensation intact. Gait not checked.  PSYCHIATRIC: The patient is alert and oriented x 3.  SKIN: No obvious rash, lesion, or ulcer.   DATA REVIEW:   CBC  Recent Labs Lab 09/21/16 0510  WBC 7.9  HGB 10.3*  HCT 29.5*  PLT 145*    Chemistries   Recent Labs Lab 09/21/16 0510  NA 135  K 3.5  CL 95*  CO2 30  GLUCOSE 171*  BUN 42*  CREATININE 5.32*  CALCIUM 8.3*  AST 18  ALT 10*  ALKPHOS 49  BILITOT 0.9    Cardiac Enzymes No results for input(s): TROPONINI in the last 168 hours.  Microbiology Results  Results for orders placed or performed during the hospital encounter of 09/14/16  Blood Culture (routine x 2)     Status:  Abnormal   Collection Time: 09/14/16  2:53 PM  Result Value Ref Range Status   Specimen Description BLOOD RIGHT [facility]  Final   Special Requests   Final    BOTTLES DRAWN AEROBIC AND ANAEROBIC Blood Culture adequate volume   Culture  Setup Time   Final    GRAM POSITIVE COCCI AEROBIC BOTTLE ONLY CRITICAL RESULT CALLED TO, READ BACK BY AND VERIFIED WITH: Sheema Hallaji @ 0800 09/15/16 by [facility]    Culture (A)  Final    STAPHYLOCOCCUS SPECIES (COAGULASE NEGATIVE) THE SIGNIFICANCE OF ISOLATING THIS ORGANISM FROM A SINGLE SET OF BLOOD CULTURES WHEN MULTIPLE SETS ARE DRAWN IS UNCERTAIN. PLEASE NOTIFY THE MICROBIOLOGY DEPARTMENT WITHIN ONE WEEK IF SPECIATION AND SENSITIVITIES ARE REQUIRED. Performed at [facility], [place] [address] [place], [place] [address]    Report Status 09/17/2016 FINAL  Final  Blood Culture ID Panel (Reflexed)     Status: Abnormal   Collection Time: 09/14/16  2:53 PM  Result Value Ref Range Status   Enterococcus species (A) NOT DETECTED Final    THIS TEST WAS ORDERED IN ERROR AND HAS BEEN CREDITED.    Comment: INCORRECT PANEL SET UP. SPECIMEN AGE LIMIT EXCEEDED WHEN ERROR WAS DISCOVERED. CALLED TO CHRISTINE KATSOUDAS 09/16/16 1100. SGD   Listeria monocytogenes TEST WILL BE CREDITED (A) NOT DETECTED Final   Staphylococcus species TEST WILL BE CREDITED (A) NOT DETECTED Final   Staphylococcus aureus TEST WILL BE CREDITED (A) NOT DETECTED Final   Streptococcus species TEST WILL BE CREDITED (A) NOT DETECTED Final   Streptococcus agalactiae TEST WILL BE CREDITED (A) NOT DETECTED Final   Streptococcus pneumoniae TEST WILL BE CREDITED (A) NOT DETECTED Final   Streptococcus pyogenes TEST WILL BE CREDITED (A) NOT DETECTED Final   Acinetobacter baumannii TEST WILL BE CREDITED (A) NOT DETECTED Final   Enterobacteriaceae species TEST WILL BE CREDITED (A) NOT DETECTED Final   Enterobacter cloacae complex TEST WILL BE CREDITED (A) NOT DETECTED Final   Escherichia coli TEST WILL BE  CREDITED (  A) NOT DETECTED Final   Klebsiella oxytoca TEST WILL BE CREDITED (A) NOT DETECTED Final   Klebsiella pneumoniae TEST WILL BE CREDITED (A) NOT DETECTED Final   Proteus species TEST WILL BE CREDITED (A) NOT DETECTED Final   Serratia marcescens TEST WILL BE CREDITED (A) NOT DETECTED Final   Haemophilus influenzae TEST WILL BE CREDITED (A) NOT DETECTED Final   Neisseria meningitidis TEST WILL BE CREDITED (A) NOT DETECTED Final   Pseudomonas aeruginosa TEST WILL BE CREDITED (A) NOT DETECTED Final   Candida albicans TEST WILL BE CREDITED (A) NOT DETECTED Final   Candida glabrata TEST WILL BE CREDITED (A) NOT DETECTED Final   Candida krusei TEST WILL BE CREDITED (A) NOT DETECTED Final   Candida parapsilosis TEST WILL BE CREDITED (A) NOT DETECTED Final   Candida tropicalis TEST WILL BE CREDITED (A) NOT DETECTED Final  Blood Culture (routine x 2)     Status: Abnormal   Collection Time: 09/14/16  2:58 PM  Result Value Ref Range Status   Specimen Description BLOOD BLOOD LEFT HAND  Final   Special Requests   Final    BOTTLES DRAWN AEROBIC AND ANAEROBIC Blood Culture adequate volume   Culture  Setup Time   Final    GRAM POSITIVE RODS AEROBIC BOTTLE ONLY CRITICAL RESULT CALLED TO, READ BACK BY AND VERIFIED WITH: MATT MCBANE AT 3300 ON 09/15/16 RWW CONFIRMED BY [facility]    Culture (A)  Final    BACILLUS SPECIES Standardized susceptibility testing for this organism is not available. Performed at [facility], [place] [address] [place], [place] [address]    Report Status 09/17/2016 FINAL  Final  Blood Culture ID Panel (Reflexed)     Status: None   Collection Time: 09/14/16  2:58 PM  Result Value Ref Range Status   Enterococcus species NOT DETECTED NOT DETECTED Final   Listeria monocytogenes NOT DETECTED NOT DETECTED Final   Staphylococcus species NOT DETECTED NOT DETECTED Final   Staphylococcus aureus NOT DETECTED NOT DETECTED Final   Streptococcus species NOT DETECTED NOT DETECTED Final    Streptococcus agalactiae NOT DETECTED NOT DETECTED Final   Streptococcus pneumoniae NOT DETECTED NOT DETECTED Final   Streptococcus pyogenes NOT DETECTED NOT DETECTED Final   Acinetobacter baumannii NOT DETECTED NOT DETECTED Final   Enterobacteriaceae species NOT DETECTED NOT DETECTED Final   Enterobacter cloacae complex NOT DETECTED NOT DETECTED Final   Escherichia coli NOT DETECTED NOT DETECTED Final   Klebsiella oxytoca NOT DETECTED NOT DETECTED Final   Klebsiella pneumoniae NOT DETECTED NOT DETECTED Final   Proteus species NOT DETECTED NOT DETECTED Final   Serratia marcescens NOT DETECTED NOT DETECTED Final   Haemophilus influenzae NOT DETECTED NOT DETECTED Final   Neisseria meningitidis NOT DETECTED NOT DETECTED Final   Pseudomonas aeruginosa NOT DETECTED NOT DETECTED Final   Candida albicans NOT DETECTED NOT DETECTED Final   Candida glabrata NOT DETECTED NOT DETECTED Final   Candida krusei NOT DETECTED NOT DETECTED Final   Candida parapsilosis NOT DETECTED NOT DETECTED Final   Candida tropicalis NOT DETECTED NOT DETECTED Final  Body fluid culture (includes gram stain)     Status: None   Collection Time: 09/14/16  3:47 PM  Result Value Ref Range Status   Specimen Description PLEURAL  Final   Special Requests PLEURAL  Final   Gram Stain   Final    RARE WBC PRESENT, PREDOMINANTLY MONONUCLEAR NO ORGANISMS SEEN    Culture   Final    NO GROWTH 3  DAYS Performed at [facility], [place] [address], [place], [place] [address]    Report Status 09/18/2016 FINAL  Final  Culture, sputum-assessment     Status: None   Collection Time: 09/14/16  6:32 PM  Result Value Ref Range Status   Specimen Description SPUTUM  Final   Special Requests Immunocompromised  Final   Sputum evaluation THIS SPECIMEN IS ACCEPTABLE FOR SPUTUM CULTURE  Final   Report Status 09/14/2016 FINAL  Final  Culture, respiratory (NON-Expectorated)     Status: None   Collection Time: 09/14/16  6:32 PM  Result  Value Ref Range Status   Specimen Description SPUTUM  Final   Special Requests Immunocompromised Reflexed from [id number]  Final   Gram Stain   Final    FEW WBC PRESENT,BOTH PMN AND MONONUCLEAR FEW GRAM NEGATIVE RODS FEW GRAM NEGATIVE COCCI IN PAIRS    Culture   Final    Consistent with normal respiratory flora. Performed at [facility], [place] [address] [place], [place] [address]    Report Status 09/17/2016 FINAL  Final  MRSA PCR Screening     Status: Abnormal   Collection Time: 09/15/16  8:30 PM  Result Value Ref Range Status   MRSA by PCR POSITIVE (A) NEGATIVE Corrected    Comment: MARSHA HATCH  AT 2155 ON 09/15/16 RWW        The GeneXpert MRSA Assay (FDA approved for NASAL specimens only), is one component of a comprehensive MRSA colonization surveillance program. It is not intended to diagnose MRSA infection nor to guide or monitor treatment for MRSA infections. CORRECTED ON 06/18 AT 0544: PREVIOUSLY REPORTED AS POSITIVE        The GeneXpert MRSA Assay (FDA approved for NASAL specimens only), is one component of a comprehensive MRSA colonization surveillance program. It is not intended to diagnose MRSA infection  nor to guide or monitor treatment for MRSA infections. RESULT CALLED TO, READ BACK BY AND VERIFIED WITH: MARSHAATCH AT 2155 ON 09/15/16 RWW     RADIOLOGY:  No results found.  EKG:   Orders placed or performed during the hospital encounter of 09/14/16  . ED EKG  . ED EKG      Management plans discussed with the patient, family and they are in agreement.  CODE STATUS:     Code Status Orders        Start     Ordered   09/14/16 1832  Full code  Continuous     09/14/16 1831    Code Status History    Date Active Date Inactive Code Status Order ID Comments User Context   08/26/2016  6:45 AM 08/28/2016  8:11 PM Full Code [id number]  Saundra Shelling, MD Inpatient   08/20/2016  3:05 AM 08/22/2016  1:01 PM Full Code [id number]  Lance Coon, MD ED     Advance Directive Documentation     Most Recent Value  Type of Advance Directive  Living will  Pre-existing out of facility DNR order (yellow form or pink MOST form)  -  "MOST" Form in Place?  -      TOTAL TIME TAKING CARE OF THIS PATIENT: 45  minutes.   Note: This dictation was prepared with Dragon dictation along with smaller phrase technology. Any transcriptional errors that result from this process are unintentional.   @M[facility] @  on 09/23/2016 at 12:47 PM  Between 7am to 6pm - Pager - [phone]  After 6pm go to [email or web address] - password [email or web address]  [facility]  Office  [phone]  CC: Primary care physician; Calvin Ruths, MD

## 2016-09-23 NOTE — Progress Notes (Signed)
Post hd vitals 

## 2016-09-23 NOTE — Progress Notes (Addendum)
EMS called to transport patient to facility., was told there was no truck available right now but will send one when available

## 2016-09-23 NOTE — Progress Notes (Signed)
  End of hd 

## 2016-09-23 NOTE — Progress Notes (Signed)
Inpatient Diabetes Program Recommendations  AACE/ADA: New Consensus Statement on Inpatient Glycemic Control (2015)  Target Ranges:  Prepandial:   less than 140 mg/dL      Peak postprandial:   less than 180 mg/dL (1-2 hours)      Critically ill patients:  140 - 180 mg/dL   Results for LATIF, NAZARENO (MRN [id number]) as of 09/23/2016 11:53  Ref. Range 09/22/2016 07:50 09/22/2016 11:58 09/22/2016 16:55 09/22/2016 21:18 09/23/2016 07:35  Glucose-Capillary Latest Ref Range: 65 - 99 mg/dL 244 (H) 261 (H) 239 (H) 222 (H) 171 (H)   Review of Glycemic Control  Diabetes history: DM2 Outpatient Diabetes medications: NPH 65 units BID, Humalog 10 units TID with meals Current orders for Inpatient glycemic control: Levemir 30 units QHS, Novolog 0-9 units TID with meals  Inpatient Diabetes Program Recommendations: Insulin - Meal Coverage: Post prandial glucose consistently elevated. Please consider ordering Novolog 4 units TID with meals for meal coverage if patient eats at least 50% of meals. Insulin-Correction: Please consider ordering Novolog 0-5 units QHS for bedtime correction.  Thanks, Barnie Alderman, RN, MSN, CDE Diabetes Coordinator Inpatient Diabetes Program [phone] (Team Pager from 8am to 5pm)

## 2016-09-23 NOTE — Progress Notes (Signed)
Received report from dialysis nurse , 1.6L fluid removed during dialysis,  pt tolerated,.

## 2016-09-23 NOTE — Progress Notes (Signed)
Post hd assessment 

## 2016-09-23 NOTE — Progress Notes (Signed)
Patient taking of the floor to dialysis at this time

## 2016-09-23 NOTE — Progress Notes (Signed)
Pre hd info 

## 2016-09-23 NOTE — Discharge Instructions (Signed)
Follow-up with primary care physician at the facility in 2-3 days Continue hemodialysis on Monday, Wednesday and Friday and follow-up with nephrology in 2 weeks Follow-up with pulmonology in 2 weeks

## 2016-09-23 NOTE — Progress Notes (Signed)
Patient to be discharged to [facility] after dialysis, report called to receiving nurse

## 2016-09-23 NOTE — Progress Notes (Signed)
Patient presently resting in the bed, remains alert and oriented, denies any pain, rounded with MD at bedside, patient updated about plan for discharge to SNF after dialysis,

## 2016-09-23 NOTE — Progress Notes (Signed)
Hd start 

## 2016-09-24 LAB — HEPATITIS B SURFACE ANTIGEN: Hepatitis B Surface Ag: NEGATIVE

## 2016-09-27 ENCOUNTER — Non-Acute Institutional Stay (SKILLED_NURSING_FACILITY): Payer: Medicare Other | Admitting: Gerontology

## 2016-09-27 DIAGNOSIS — J189 Pneumonia, unspecified organism: Secondary | ICD-10-CM

## 2016-09-29 ENCOUNTER — Encounter
Admission: RE | Admit: 2016-09-29 | Discharge: 2016-09-29 | Disposition: A | Discharge status: Home / Self Care | Payer: Medicare Other | Source: Ambulatory Visit | Attending: Internal Medicine | Admitting: Internal Medicine

## 2016-10-01 ENCOUNTER — Non-Acute Institutional Stay (SKILLED_NURSING_FACILITY): Payer: Medicare Other | Admitting: Gerontology

## 2016-10-01 DIAGNOSIS — J189 Pneumonia, unspecified organism: Secondary | ICD-10-CM | POA: Diagnosis not present

## 2016-10-03 ENCOUNTER — Encounter: Payer: Self-pay | Admitting: Gerontology

## 2016-10-03 NOTE — Progress Notes (Signed)
Location:   [facility] Room Number: [place] of Service:  SNF [phone]) Provider:  Toni Arthurs, NP-C  [name], MD  Patient Care Team: [name], MD as PCP - General (Internal Medicine)  Extended Emergency Contact Information Primary Emergency Contact: [facility] Address: [address]          [place], [place] [address] Johnnette Litter of [place] Phone: [phone] Relation: Daughter Secondary Emergency Contact: Emmaline Life [place] of [place] Phone: [phone] Relation: Daughter  Code Status:  Full Goals of care: Advanced Directive information Advanced Directives 10/01/2016  Does Patient Have a Medical Advance Directive? No  Type of Advance Directive -  Does patient want to make changes to medical advance directive? -  Copy of [place] in Chart? -     Chief Complaint  Patient presents with  . Acute Visit    Acute    HPI:  Pt is a 76 y.o. male seen today for an acute visit for ongoing dyspnea and agitation. Pt continues to c/o dyspnea and feeling like he can't catch his breath with minimal movement. Pt had been very upset and agitated with nursing staff this morning. He has been calling his daughter on the phone repeatedly. Son-in-law actually came to the facility/ pt's room to talk to him about "being nicer" to the staff. Pt reports having worsening dyspnea at night if he tries to lie down or when he walks. On room air at rest, O2 sats are 92%. When pt walks up the hallway on RA, sats decrease to 82%. Pt recovers fairly quickly after having O2 applied. Pt was able to relax after long discussion about the efforts being made to find out more of what could be going on with him. This is such as arranging a pulmonology appointment, potentially repeating the CT scan or other scans, assurance that multiple providers have reviewed his scans, etc. Afterwards, I spoke with son-in-law. He hold me about  the pt's past history, past occupation, personality, etc. Son-in-law was appreciative of the conversation with the pt. VSS. Will continue to monitor closely. Dr Ouida Sills updated on conversations.    Past Medical History:  Diagnosis Date  . Cancer [facility] [place])    Prostate  . CHF (congestive heart failure) ([place])   . Chronic kidney disease   . Complication of anesthesia    hard to wake up  . COPD (chronic obstructive pulmonary disease) ([place])   . Diabetes mellitus without complication ([place])   . Difficult intubation   . Dyspnea   . GERD (gastroesophageal reflux disease)   . Hyperlipidemia   . Hypertension    Past Surgical History:  Procedure Laterality Date  . APPENDECTOMY    . AV FISTULA PLACEMENT Left 05/30/2016   Procedure: ARTERIOVENOUS (AV) FISTULA CREATION ( BRACHIOCEPHALIC );  Surgeon: [name], MD;  Location: [facility] ORS;  Service: Vascular;  Laterality: Left;  . CAPD INSERTION N/A 05/30/2016   Procedure: LAPAROSCOPIC INSERTION CONTINUOUS AMBULATORY PERITONEAL DIALYSIS  (CAPD) CATHETER;  Surgeon: [name], MD;  Location: [facility] ORS;  Service: Vascular;  Laterality: N/A;  . DIALYSIS/PERMA CATHETER INSERTION    . DIALYSIS/PERMA CATHETER REMOVAL N/A 08/21/2016   Procedure: Dialysis/Perma Catheter Removal;  Surgeon: [name], MD;  Location: [place] CV LAB;  Service: Cardiovascular;  Laterality: N/A;  . JOINT REPLACEMENT     right knee   . LEFT HEART CATH AND CORONARY ANGIOGRAPHY N/A 08/21/2016   Procedure: Left Heart  Cath and Coronary Angiography possible PCI;  Surgeon: [name], MD;  Location: [place] CV LAB;  Service: Cardiovascular;  Laterality: N/A;  . postate removal     . PROSTATE SURGERY    . PSEUDOANERYSM COMPRESSION Right 08/27/2016   Procedure: Pseudoanerysm Compression;  Surgeon: Katha Cabal, MD;  Location: [facility];  Service: Cardiovascular;  Laterality: Right;  . REMOVAL OF A DIALYSIS CATHETER N/A 08/08/2016   Procedure: REMOVAL OF A  DIALYSIS CATHETER;  Surgeon: [name], MD;  Location: [facility] ORS;  Service: Vascular;  Laterality: N/A;    Allergies  Allergen Reactions  . Tetracyclines & Related Other (See Comments)    Reaction: unknown happened a long time ago and pt doesn't remember reaction   . Morphine Other (See Comments)    Reaction: confusion    Allergies as of 10/01/2016      Reactions   Tetracyclines & Related Other (See Comments)   Reaction: unknown happened a long time ago and pt doesn't remember reaction    Morphine Other (See Comments)   Reaction: confusion      Medication List       Accurate as of 10/01/16 11:59 PM. Always use your most recent med list.          acetaminophen 325 MG tablet Commonly known as:  TYLENOL Take 2 tablets (650 mg total) by mouth every 6 (six) hours as needed for mild pain (or Fever >/= 101).   amLODipine 5 MG tablet Commonly known as:  NORVASC Take 5 mg by mouth at bedtime.   aspirin 81 MG chewable tablet Chew 81 mg by mouth daily.   atorvastatin 40 MG tablet Commonly known as:  LIPITOR Take 40 mg by mouth daily.   calcium acetate 667 MG capsule Commonly known as:  PHOSLO Take 1,334 mg by mouth 3 (three) times daily with meals.   carvedilol 6.25 MG tablet Commonly known as:  COREG Take 6.25 mg by mouth 2 (two) times daily.   dextrose 40 % Gel Commonly known as:  GLUTOSE Take 1 Tube by mouth every 30 (thirty) minutes as needed for low blood sugar. Or until BS > 70 and pt is still responsive.   doxazosin 8 MG tablet Commonly known as:  CARDURA Take 8 mg by mouth daily.   furosemide 40 MG tablet Commonly known as:  LASIX Take 40 mg by mouth daily.   GLUCAGON EMERGENCY 1 MG injection Generic drug:  glucagon Inject 1 mg into the muscle once as needed. If not responding to oral glucose, snack. May repeat x 1 after 15 minutes. Use only if pt is or is becoming unresponsive as needed.   guaiFENesin-dextromethorphan 100-10 MG/5ML syrup Commonly known  as:  ROBITUSSIN DM Take 5 mLs by mouth every 4 (four) hours as needed for cough.   insulin aspart 100 UNIT/ML injection Commonly known as:  novoLOG Inject 4 Units into the skin 3 (three) times daily with meals. And hold if eating < 50 % of meal. Give in addition to sliding scale   insulin aspart 100 UNIT/ML injection Commonly known as:  novoLOG Inject 0-5 Units into the skin at bedtime. If BS is 200 - 250 give 2 units, if 251 - 300 give 3 units, 301 - 350 4 units, if BS > 400 give 0 units   insulin aspart 100 UNIT/ML injection Commonly known as:  novoLOG Inject 0-9 Units into the skin 3 (three) times daily with meals. CBG < 70: implement  hypoglycemia protocol CBG 70 - 120: 0 units CBG 121 - 150: 1 unit CBG 151 - 200: 2 units CBG 201 - 250: 3 units CBG 251 - 300: 5 units CBG 301 - 350: 7 units CBG 351 - 400: 9 units CBG > 400: call MD and obtain STAT lab verification   insulin detemir 100 UNIT/ML injection Commonly known as:  LEVEMIR Inject 0.3 mLs (30 Units total) into the skin at bedtime.   ipratropium-albuterol 0.5-2.5 (3) MG/3ML Soln Commonly known as:  DUONEB Take 3 mLs by nebulization every 6 (six) hours as needed.   isosorbide mononitrate 30 MG 24 hr tablet Commonly known as:  IMDUR Take 1 tablet (30 mg total) by mouth daily.   levofloxacin 500 MG tablet Commonly known as:  LEVAQUIN Take 500 mg by mouth every other day.   Lidocaine-Prilocaine (Bulk) 2.5-2.5 % Crea Apply 1 application topically as needed. Apply to fistula site prior to dialysis treatments as needed   lisinopril 20 MG tablet Commonly known as:  PRINIVIL,ZESTRIL Take 20 mg by mouth daily.   omeprazole 20 MG capsule Commonly known as:  PRILOSEC Take 20 mg by mouth 2 (two) times daily before a meal.   ondansetron 4 MG tablet Commonly known as:  ZOFRAN Take 1 tablet (4 mg total) by mouth every 6 (six) hours as needed for nausea.   OXYGEN Inhale 2 L into the lungs continuous.   spironolactone 25 MG  tablet Commonly known as:  ALDACTONE Take 25 mg by mouth daily.       Review of Systems   There is no immunization history on file for this patient. Pertinent  Health Maintenance Due  Topic Date Due  . HEMOGLOBIN A1C  1941-02-17  . FOOT EXAM  05/14/1950  . OPHTHALMOLOGY EXAM  [birth year]14/1950  . PNA vac Low Risk Adult (1 of 2 - PCV13) 05/15/2003  . INFLUENZA VACCINE  10/30/2016   No flowsheet data found. Functional Status Survey:    Vitals:   10/01/16 1411  BP: (!) 146/49  Pulse: 73  Resp: 20  Temp: 98.1 F (36.7 C)  SpO2: 96%  Weight: 231 lb 6.4 oz (105 kg)  Height: 5\' 9"  (1.753 m)   Body mass index is 34.17 kg/m. Physical Exam  Labs reviewed:  Recent Labs  08/21/16 1610  08/27/16 0417  09/18/16 1200 09/19/16 0418 09/21/16 0510  NA 134*  < > 138  < > 130* 136 135  K 4.0  < > 3.9  < > 4.5 4.1 3.5  CL 98*  < > 99*  < > 94* 97* 95*  CO2 26  < > 30  < > 26 31 30   GLUCOSE 300*  < > 161*  < > 168* 148* 171*  BUN 44*  < > 37*  < > 62* 45* 42*  CREATININE 6.41*  < > 5.23*  < > 7.61* 6.05* 5.32*  CALCIUM 8.5*  < > 8.5*  < > 8.6* 8.6* 8.3*  PHOS 5.2*  --  3.7  --  4.8*  --   --   < > = values in this interval not displayed.  Recent Labs  09/15/16 0510 09/18/16 1200 09/19/16 0418 09/21/16 0510  AST 13*  --  11* 18  ALT 13*  --  11* 10*  ALKPHOS 54  --  43 49  BILITOT 0.9  --  0.8 0.9  PROT 6.5  --  7.0 6.6  ALBUMIN 3.4* 3.3* 3.3* 3.1*    Recent Labs  08/26/16 0046  09/14/16 1249  09/18/16 1200 09/19/16 0418 09/21/16 0510  WBC 9.9  < > 8.2  < > 8.2 8.0 7.9  NEUTROABS 6.6*  --  5.8  --   --   --  5.2  HGB 10.1*  < > 10.3*  < > 8.8* 10.2* 10.3*  HCT 29.7*  < > 29.4*  < > 26.5* 30.0* 29.5*  MCV 88.6  < > 86.9  < > 88.8 89.2 88.1  PLT 146*  < > 175  < > 146* 145* 145*  < > = values in this interval not displayed. No results found for: TSH No results found for: HGBA1C No results found for: CHOL, HDL, LDLCALC, LDLDIRECT, TRIG, CHOLHDL  Significant  Diagnostic Results in last 30 days:  Dg Chest 2 View  Result Date: 09/17/2016 CLINICAL DATA:  Shortness of breath. History of CHF and COPD. Former smoker. EXAM: CHEST  2 VIEW COMPARISON:  PA and lateral chest x-ray of September 15, 2016 FINDINGS: Dense infiltrate in the right lower lobe has improved. There is persistent increased interstitial density within both lungs. The heart is normal in size. The pulmonary vascularity is not engorged. There is calcification in the wall of the aortic arch. The bony thorax exhibits no acute abnormality. IMPRESSION: Improving right lower lobe pneumonia. Persistent increased interstitial markings likely reflect the patient's COPD and smoking history. Thoracic aortic atherosclerosis. Electronically Signed   By: David  Martinique M.D.   On: 09/17/2016 08:23   Dg Chest 2 View  Result Date: 09/15/2016 CLINICAL DATA:  SOB EXAM: CHEST  2 VIEW COMPARISON:  09/14/2016 FINDINGS: Heart size is upper limits normal. Right lower lobe infiltrate is again noted. Prominent interstitial markings appear stable. IMPRESSION: Persistent right lower lobe infiltrate. Electronically Signed   By: Nolon Nations M.D.   On: 09/15/2016 10:51   Dg Chest 2 View  Result Date: 09/14/2016 CLINICAL DATA:  Dyspnea EXAM: CHEST  2 VIEW COMPARISON:  08/19/2016 chest radiograph. FINDINGS: Stable cardiomediastinal silhouette with mild cardiomegaly and aortic atherosclerosis. No pneumothorax. Small right pleural effusion. Trace left pleural effusion. Borderline mild pulmonary edema. Patchy right lung base opacity. IMPRESSION: 1. Borderline mild congestive heart failure. 2. Small right and trace left pleural effusions. 3. Patchy right lung base opacity, favor atelectasis, difficult to exclude a component of aspiration or pneumonia. Electronically Signed   By: Ilona Sorrel M.D.   On: 09/14/2016 13:50   Ct Head Wo Contrast  Result Date: 09/19/2016 CLINICAL DATA:  Altered mental status.  Hypoxia. EXAM: CT HEAD WITHOUT  CONTRAST TECHNIQUE: Contiguous axial images were obtained from the base of the skull through the vertex without intravenous contrast. COMPARISON:  None. FINDINGS: Brain: There is mild diffuse atrophy. There is no intracranial mass, hemorrhage, extra-axial fluid collection, or midline shift. There is mild small vessel disease in the centra semiovale bilaterally. Elsewhere gray-white compartments appear normal. No evident acute infarct. Vascular: There is no appreciable hyperdense vessel. There is calcification in carotid siphon. Skull: The bony calvarium appears intact. Sinuses/Orbits: There is mucosal thickening in several ethmoid air cells bilaterally. Other visualized paranasal sinuses are clear. Orbits bilaterally appear symmetric. Other: Visualized mastoid air cells are clear. IMPRESSION: Mild atrophy with mild periventricular small vessel disease. No intracranial mass hemorrhage, or extra-axial fluid collection. No acute infarct evident. Foci of arterial vascular calcification noted. Mild ethmoid sinus disease. Electronically Signed   By: Lowella Grip III M.D.   On: 09/19/2016 12:28   Ct Chest Wo Contrast  Result Date:  09/14/2016 CLINICAL DATA:  Increasing dyspnea. EXAM: CT CHEST WITHOUT CONTRAST TECHNIQUE: Multidetector CT imaging of the chest was performed following the standard protocol without IV contrast. COMPARISON:  Chest radiograph 09/14/2016 FINDINGS: Cardiovascular: Mildly enlarged heart size. No pericardial effusion. Heavy calcific atherosclerotic disease of the aorta and coronary arteries. Tortuosity of the aorta. Mediastinum/Nodes: Bilateral mediastinal lymph nodes with borderline thickening measuring up to 1.5 cm short axis. Similarly mildly prominent bilateral hilar lymph nodes. Lungs/Pleura: Large in size right pleural effusion. Right lower lobe peribronchial airspace consolidation. Minimal left pleural effusion. Minimal interstitial pulmonary edema. Upper Abdomen: Cholelithiasis.  Visualized portion of the upper abdominal organs otherwise normal. Musculoskeletal: No chest wall mass or suspicious bone lesions identified. IMPRESSION: Mildly enlarged cardiac silhouette. Calcific atherosclerotic disease of the coronary arteries. Large right pleural effusion. Right lower lobe peribronchial airspace consolidation may represent atelectasis or infectious consolidation. Tiny left pleural effusion. Minimal interstitial pulmonary edema. Mediastinal and hilar lymphadenopathy, which may be reactive or malignant. Aortic Atherosclerosis (ICD10-I70.0). Electronically Signed   By: Fidela Salisbury M.D.   On: 09/14/2016 14:45   Dg Chest Port 1 View  Result Date: 09/18/2016 CLINICAL DATA:  Pt's oxygen decreased. Pt is SOB. Hx of CHF, COPD, Diabetes and HTN. Hx of dialysis catheter. EXAM: PORTABLE CHEST - 1 VIEW COMPARISON:  09/17/2016 FINDINGS: Improvement in the patchy right lower lung airspace disease. Interstitial opacities in both lung bases are stable. No new infiltrate or overt edema. Heart size appears mildly enlarged for technique. Atheromatous aorta. No effusion.  No pneumothorax. Visualized bones unremarkable. IMPRESSION: Improving right lower lobe infiltrate. Aortic Atherosclerosis (ICD10-170.0) Electronically Signed   By: Lucrezia Europe M.D.   On: 09/18/2016 08:21   Dg Chest Portable 1 View  Result Date: 09/14/2016 CLINICAL DATA:  POST THORACENTESIS. EXAM: PORTABLE CHEST 1 VIEW COMPARISON:  09/14/2016 FINDINGS: The heart is enlarged. There are interstitial changes consistent with pulmonary edema. There has been improvement in the aeration at right lung base since the prior study. No pneumothorax. IMPRESSION: Interstitial pulmonary edema. Improved aeration in the right lower lobe.  No pneumothorax. Electronically Signed   By: Nolon Nations M.D.   On: 09/14/2016 17:27    Assessment/Plan 1. HCAP (healthcare-associated pneumonia)  Continue Levaquin until course complete  Incentive  Spirometer 10x/ hour while awake  Duonebs prn  Pulmonology consult  Palliative Care Consult  Family/ staff Communication:   Total Time: 60 minutes  Documentation: 5 minutes  Face to Face: 45 minutes  Family/Phone: 10 minutes   Labs/tests ordered:  None- obtained at HD  Medication list reviewed and assessed for continued appropriateness.  Vikki Ports, NP-C Geriatrics [facility] [phone], [place] [address] Cell Phone (Mon-Fri 8am-5pm):  [phone] On Call:  [phone] & follow prompts after 5pm & weekends Office Phone:  [phone] Office Fax:  [phone]

## 2016-10-07 ENCOUNTER — Other Ambulatory Visit: Payer: Self-pay | Admitting: Internal Medicine

## 2016-10-07 ENCOUNTER — Other Ambulatory Visit: Payer: Self-pay | Admitting: Nephrology

## 2016-10-07 DIAGNOSIS — R531 Weakness: Secondary | ICD-10-CM

## 2016-10-07 DIAGNOSIS — R5381 Other malaise: Secondary | ICD-10-CM

## 2016-10-07 DIAGNOSIS — J90 Pleural effusion, not elsewhere classified: Secondary | ICD-10-CM

## 2016-10-07 DIAGNOSIS — Z8546 Personal history of malignant neoplasm of prostate: Secondary | ICD-10-CM

## 2016-10-08 ENCOUNTER — Non-Acute Institutional Stay (SKILLED_NURSING_FACILITY): Payer: Medicare Other | Admitting: Gerontology

## 2016-10-08 ENCOUNTER — Ambulatory Visit (INDEPENDENT_AMBULATORY_CARE_PROVIDER_SITE_OTHER): Payer: Medicare Other | Admitting: Vascular Surgery

## 2016-10-08 ENCOUNTER — Encounter: Payer: Self-pay | Admitting: Gerontology

## 2016-10-08 DIAGNOSIS — J189 Pneumonia, unspecified organism: Secondary | ICD-10-CM

## 2016-10-08 DIAGNOSIS — F419 Anxiety disorder, unspecified: Secondary | ICD-10-CM | POA: Diagnosis not present

## 2016-10-08 NOTE — Progress Notes (Signed)
Location:    [facility] Room Number: [place] of Service:  SNF [phone])  Provider: Toni Arthurs, NP-C  PCP: [name], MD Patient Care Team: [name], MD as PCP - General (Internal Medicine)  Extended Emergency Contact Information Primary Emergency Contact: [facility] Address: [address]          [place], [place] [address] Johnnette Litter of [place] Phone: [phone] Relation: Daughter Secondary Emergency Contact: Emmaline Life [place] of [place] Phone: [phone] Relation: Daughter  Code Status: Full Goals of care:  Advanced Directive information Advanced Directives 10/08/2016  Does Patient Have a Medical Advance Directive? No  Type of Advance Directive -  Does patient want to make changes to medical advance directive? -  Copy of [place] in Chart? -     Allergies  Allergen Reactions  . Tetracyclines & Related Other (See Comments)    Reaction: unknown happened a long time ago and pt doesn't remember reaction   . Morphine Other (See Comments)    Reaction: confusion    Chief Complaint  Patient presents with  . Discharge Note    Discharged from SNF    HPI:  76 y.o. male seen today for discharge evaluation. Pt was admitted to the facility for HCAP and encephalopathy. Pt is also ESRD on hemodialysis. While here, he had ongoing complaints of shortness of breath. He was treated for persistent PNA with course of renally adjusted levaquin. Arrangements were made for consult with pulmonologist. Pt was fixated on wanting to "find out what is wrong with my lungs." Pt became almost obsessive about this and calling his daughter numerous times throughout the day and night. Pt highly anxious about his physical condition, causing worsening dyspnea. After long discussion with his daughter about this, she gave permission to try Seroquel for the anxiety/ obsession. After pt had been on the seroquel  for several days, he was evaluated this morning for stability for discharge. Pt was alert and oriented, pleasant, smiling. He did not seem anxious or fixated on the breathing. He did express concern for the edema in his feet. However, daughter reports the edema was addressed by the Renal MD- said he  Has to be careful with dialysis treatments d/t B/P and other co-morbidities at this time. Daughter agrees the seroquel is helping the mood. Pt reports he is feeling better and is ready to go home. VSS. No other complaints. Pt was discharged in satisfactory condition without dyspnea or adventitious breath sounds.       Past Medical History:  Diagnosis Date  . Cancer [facility])    Prostate  . CHF (congestive heart failure) ([place])   . Chronic kidney disease   . Complication of anesthesia    hard to wake up  . COPD (chronic obstructive pulmonary disease) ([place])   . Diabetes mellitus without complication ([place])   . Difficult intubation   . Dyspnea   . GERD (gastroesophageal reflux disease)   . Hyperlipidemia   . Hypertension     Past Surgical History:  Procedure Laterality Date  . APPENDECTOMY    . AV FISTULA PLACEMENT Left 05/30/2016   Procedure: ARTERIOVENOUS (AV) FISTULA CREATION ( BRACHIOCEPHALIC );  Surgeon: [name], MD;  Location: [facility] ORS;  Service: Vascular;  Laterality: Left;  . CAPD INSERTION N/A 05/30/2016   Procedure: LAPAROSCOPIC INSERTION CONTINUOUS AMBULATORY PERITONEAL DIALYSIS  (CAPD) CATHETER;  Surgeon: [name], MD;  Location: [facility] ORS;  Service: Vascular;  Laterality: N/A;  . DIALYSIS/PERMA CATHETER INSERTION    . DIALYSIS/PERMA CATHETER REMOVAL N/A 08/21/2016   Procedure: Dialysis/Perma Catheter Removal;  Surgeon: [name], MD;  Location: [place] CV LAB;  Service: Cardiovascular;  Laterality: N/A;  . JOINT REPLACEMENT     right knee   . LEFT HEART CATH AND CORONARY ANGIOGRAPHY N/A 08/21/2016   Procedure: Left Heart Cath and Coronary Angiography possible PCI;  Surgeon:  [name], MD;  Location: [place] CV LAB;  Service: Cardiovascular;  Laterality: N/A;  . postate removal     . PROSTATE SURGERY    . PSEUDOANERYSM COMPRESSION Right 08/27/2016   Procedure: Pseudoanerysm Compression;  Surgeon: Katha Cabal, MD;  Location: [place] CV LAB;  Service: Cardiovascular;  Laterality: Right;  . REMOVAL OF A DIALYSIS CATHETER N/A 08/08/2016   Procedure: REMOVAL OF A DIALYSIS CATHETER;  Surgeon: [name], MD;  Location: [facility] ORS;  Service: Vascular;  Laterality: N/A;      reports that he quit smoking about 34 years ago. He has never used smokeless tobacco. He reports that he drinks about 1.8 oz of alcohol per week . He reports that he does not use drugs. Social History   Social History  . Marital status: Widowed    Spouse name: N/A  . Number of children: N/A  . Years of education: N/A   Occupational History  . retired    Social History Main Topics  . Smoking status: Former Smoker    Quit date: 05/21/1982  . Smokeless tobacco: Never Used  . Alcohol use 1.8 oz/week    3 Glasses of wine per week  . Drug use: No  . Sexual activity: No   Other Topics Concern  . Not on file   Social History Narrative  . No narrative on file   Functional Status Survey:    Allergies  Allergen Reactions  . Tetracyclines & Related Other (See Comments)    Reaction: unknown happened a long time ago and pt doesn't remember reaction   . Morphine Other (See Comments)    Reaction: confusion    Pertinent  Health Maintenance Due  Topic Date Due  . HEMOGLOBIN A1C  December 01, 1940  . FOOT EXAM  05/14/1948  . OPHTHALMOLOGY EXAM  05/14/1948  . PNA vac Low Risk Adult (1 of 2 - PCV13) 05/15/2003  . INFLUENZA VACCINE  10/30/2016    Medications: Allergies as of 10/08/2016      Reactions   Tetracyclines & Related Other (See Comments)   Reaction: unknown happened a long time ago and pt doesn't remember reaction    Morphine Other (See Comments)    Reaction: confusion      Medication List       Accurate as of 10/08/16 11:59 AM. Always use your most recent med list.          acetaminophen 325 MG tablet Commonly known as:  TYLENOL Take 2 tablets (650 mg total) by mouth every 6 (six) hours as needed for mild pain (or Fever >/= 101).   amLODipine 5 MG tablet Commonly known as:  NORVASC Take 5 mg by mouth at bedtime.   aspirin 81 MG chewable tablet Chew 81 mg by mouth daily. 8 am   atorvastatin 40 MG tablet Commonly known as:  LIPITOR Take 40 mg by mouth daily. 8 pm   calcium acetate 667 MG capsule Commonly known as:  PHOSLO Take 1,334 mg by mouth 3 (three) times daily with meals. 2 caps  at 8 am, 12 pm, 5 pm   carvedilol 6.25 MG tablet Commonly known as:  COREG Take 6.25 mg by mouth 2 (two) times daily. 8 am, 5 pm   dextrose 40 % Gel Commonly known as:  GLUTOSE Take 1 Tube by mouth every 30 (thirty) minutes as needed for low blood sugar. Or until BS > 70 and pt is still responsive.   doxazosin 8 MG tablet Commonly known as:  CARDURA Take 8 mg by mouth daily.   furosemide 40 MG tablet Commonly known as:  LASIX Take 40 mg by mouth daily. 8 am   GLUCAGON EMERGENCY 1 MG injection Generic drug:  glucagon Inject 1 mg into the muscle once as needed. If not responding to oral glucose, snack. May repeat x 1 after 15 minutes. Use only if pt is or is becoming unresponsive as needed.   guaiFENesin-dextromethorphan 100-10 MG/5ML syrup Commonly known as:  ROBITUSSIN DM Take 5 mLs by mouth every 4 (four) hours as needed for cough.   insulin aspart 100 UNIT/ML injection Commonly known as:  novoLOG Inject 4 Units into the skin 3 (three) times daily with meals. And hold if eating < 50 % of meal. Give in addition to sliding scale   insulin aspart 100 UNIT/ML injection Commonly known as:  novoLOG Inject 0-5 Units into the skin at bedtime. If BS is 200 - 250 give 2 units, if 251 - 300 give 3 units, 301 - 350 4 units, if BS >  400 give 0 units   insulin aspart 100 UNIT/ML injection Commonly known as:  novoLOG Inject 0-9 Units into the skin 3 (three) times daily with meals. CBG < 70: implement hypoglycemia protocol CBG 70 - 120: 0 units CBG 121 - 150: 1 unit CBG 151 - 200: 2 units CBG 201 - 250: 3 units CBG 251 - 300: 5 units CBG 301 - 350: 7 units CBG 351 - 400: 9 units CBG > 400: call MD and obtain STAT lab verification   insulin detemir 100 UNIT/ML injection Commonly known as:  LEVEMIR Inject 0.3 mLs (30 Units total) into the skin at bedtime.   ipratropium-albuterol 0.5-2.5 (3) MG/3ML Soln Commonly known as:  DUONEB Take 3 mLs by nebulization every 6 (six) hours as needed.   isosorbide mononitrate 30 MG 24 hr tablet Commonly known as:  IMDUR Take 1 tablet (30 mg total) by mouth daily.   Lidocaine-Prilocaine (Bulk) 2.5-2.5 % Crea Apply 1 application topically as needed. Apply to fistula site prior to dialysis treatments as needed   lisinopril 20 MG tablet Commonly known as:  PRINIVIL,ZESTRIL Take 20 mg by mouth daily. 8 am   omeprazole 20 MG capsule Commonly known as:  PRILOSEC Take 20 mg by mouth daily.   ondansetron 4 MG tablet Commonly known as:  ZOFRAN Take 1 tablet (4 mg total) by mouth every 6 (six) hours as needed for nausea.   OXYGEN Inhale 2 L into the lungs continuous.   QUEtiapine 25 MG tablet Commonly known as:  SEROQUEL Take 25 mg by mouth 2 (two) times daily. 8 am, 8 pm   spironolactone 25 MG tablet Commonly known as:  ALDACTONE Take 25 mg by mouth daily. 8 am       Review of Systems  Constitutional: Negative for activity change, appetite change, chills, diaphoresis and fever.  HENT: Negative for congestion, sneezing, sore throat, trouble swallowing and voice change.   Respiratory: Negative for apnea, cough, choking, chest tightness, shortness of breath and wheezing.  Cardiovascular: Negative for chest pain, palpitations and leg swelling.  Gastrointestinal: Negative for  abdominal distention, abdominal pain, constipation, diarrhea and nausea.  Genitourinary: Negative for difficulty urinating, dysuria, frequency and urgency.  Musculoskeletal: Positive for arthralgias (typical arthritis). Negative for back pain, gait problem and myalgias.  Skin: Negative for color change, pallor, rash and wound.  Neurological: Negative for dizziness, tremors, syncope, speech difficulty, weakness, numbness and headaches.  Psychiatric/Behavioral: Negative for agitation and behavioral problems.  All other systems reviewed and are negative.   Vitals:   10/08/16 1142  BP: (!) 141/62  Pulse: 74  Resp: 20  Temp: 98.4 F (36.9 C)  SpO2: 93%  Weight: 226 lb 9.6 oz (102.8 kg)  Height: 5\' 9"  (1.753 m)   Body mass index is 33.46 kg/m. Physical Exam  Constitutional: He is oriented to person, place, and time. Vital signs are normal. He appears well-developed and well-nourished. He is active and cooperative. He does not appear ill. No distress. Nasal cannula in place.  HENT:  Head: Normocephalic and atraumatic.  Mouth/Throat: Uvula is midline, oropharynx is clear and moist and mucous membranes are normal. Mucous membranes are not pale, not dry and not cyanotic.  Eyes: Pupils are equal, round, and reactive to light. Conjunctivae, EOM and lids are normal.  Neck: Trachea normal, normal range of motion and full passive range of motion without pain. Neck supple. No JVD present. No tracheal deviation, no edema and no erythema present. No thyromegaly present.  Cardiovascular: Normal rate, regular rhythm, normal heart sounds, intact distal pulses and normal pulses.  Exam reveals no gallop, no distant heart sounds and no friction rub.   No murmur heard. Pulses:      Dorsalis pedis pulses are 2+ on the right side, and 2+ on the left side.  2+ BLE pitting edema  Pulmonary/Chest: Effort normal. No accessory muscle usage. No respiratory distress. He has decreased breath sounds in the right lower  field and the left lower field. He has no wheezes. He has no rhonchi. He exhibits no tenderness.  Abdominal: Normal appearance and bowel sounds are normal. He exhibits no distension and no ascites. There is no tenderness.  Musculoskeletal: Normal range of motion. He exhibits no edema or tenderness.  Expected osteoarthritis, stiffness  Neurological: He is alert and oriented to person, place, and time. He has normal strength.  Skin: Skin is warm, dry and intact. He is not diaphoretic. No cyanosis. No pallor. Nails show no clubbing.  Psychiatric: His speech is normal and behavior is normal. Judgment and thought content normal. His mood appears anxious (much improved). Cognition and memory are normal.  Nursing note and vitals reviewed.   Labs reviewed: Basic Metabolic Panel:  Recent Labs  08/21/16 1610  08/27/16 0417  09/18/16 1200 09/19/16 0418 09/21/16 0510  NA 134*  < > 138  < > 130* 136 135  K 4.0  < > 3.9  < > 4.5 4.1 3.5  CL 98*  < > 99*  < > 94* 97* 95*  CO2 26  < > 30  < > 26 31 30   GLUCOSE 300*  < > 161*  < > 168* 148* 171*  BUN 44*  < > 37*  < > 62* 45* 42*  CREATININE 6.41*  < > 5.23*  < > 7.61* 6.05* 5.32*  CALCIUM 8.5*  < > 8.5*  < > 8.6* 8.6* 8.3*  PHOS 5.2*  --  3.7  --  4.8*  --   --   < > =  values in this interval not displayed. Liver Function Tests:  Recent Labs  09/15/16 0510 09/18/16 1200 09/19/16 0418 09/21/16 0510  AST 13*  --  11* 18  ALT 13*  --  11* 10*  ALKPHOS 54  --  43 49  BILITOT 0.9  --  0.8 0.9  PROT 6.5  --  7.0 6.6  ALBUMIN 3.4* 3.3* 3.3* 3.1*   No results for input(s): LIPASE, AMYLASE in the last 8760 hours. No results for input(s): AMMONIA in the last 8760 hours. CBC:  Recent Labs  08/26/16 0046  09/14/16 1249  09/18/16 1200 09/19/16 0418 09/21/16 0510  WBC 9.9  < > 8.2  < > 8.2 8.0 7.9  NEUTROABS 6.6*  --  5.8  --   --   --  5.2  HGB 10.1*  < > 10.3*  < > 8.8* 10.2* 10.3*  HCT 29.7*  < > 29.4*  < > 26.5* 30.0* 29.5*  MCV  88.6  < > 86.9  < > 88.8 89.2 88.1  PLT 146*  < > 175  < > 146* 145* 145*  < > = values in this interval not displayed. Cardiac Enzymes:  Recent Labs  08/20/16 0846 08/20/16 1513 09/14/16 1249  TROPONINI 3.13* 2.28* <0.03   BNP: Invalid input(s): POCBNP CBG:  Recent Labs  09/22/16 2118 09/23/16 0735 09/23/16 1208  GLUCAP 222* 171* 246*    Procedures and Imaging Studies During Stay: Dg Chest 2 View  Result Date: 09/17/2016 CLINICAL DATA:  Shortness of breath. History of CHF and COPD. Former smoker. EXAM: CHEST  2 VIEW COMPARISON:  PA and lateral chest x-ray of September 15, 2016 FINDINGS: Dense infiltrate in the right lower lobe has improved. There is persistent increased interstitial density within both lungs. The heart is normal in size. The pulmonary vascularity is not engorged. There is calcification in the wall of the aortic arch. The bony thorax exhibits no acute abnormality. IMPRESSION: Improving right lower lobe pneumonia. Persistent increased interstitial markings likely reflect the patient's COPD and smoking history. Thoracic aortic atherosclerosis. Electronically Signed   By: David  Martinique M.D.   On: 09/17/2016 08:23   Dg Chest 2 View  Result Date: 09/15/2016 CLINICAL DATA:  SOB EXAM: CHEST  2 VIEW COMPARISON:  09/14/2016 FINDINGS: Heart size is upper limits normal. Right lower lobe infiltrate is again noted. Prominent interstitial markings appear stable. IMPRESSION: Persistent right lower lobe infiltrate. Electronically Signed   By: Nolon Nations M.D.   On: 09/15/2016 10:51   Dg Chest 2 View  Result Date: 09/14/2016 CLINICAL DATA:  Dyspnea EXAM: CHEST  2 VIEW COMPARISON:  08/19/2016 chest radiograph. FINDINGS: Stable cardiomediastinal silhouette with mild cardiomegaly and aortic atherosclerosis. No pneumothorax. Small right pleural effusion. Trace left pleural effusion. Borderline mild pulmonary edema. Patchy right lung base opacity. IMPRESSION: 1. Borderline mild  congestive heart failure. 2. Small right and trace left pleural effusions. 3. Patchy right lung base opacity, favor atelectasis, difficult to exclude a component of aspiration or pneumonia. Electronically Signed   By: Ilona Sorrel M.D.   On: 09/14/2016 13:50   Ct Head Wo Contrast  Result Date: 09/19/2016 CLINICAL DATA:  Altered mental status.  Hypoxia. EXAM: CT HEAD WITHOUT CONTRAST TECHNIQUE: Contiguous axial images were obtained from the base of the skull through the vertex without intravenous contrast. COMPARISON:  None. FINDINGS: Brain: There is mild diffuse atrophy. There is no intracranial mass, hemorrhage, extra-axial fluid collection, or midline shift. There is mild small vessel disease in the  centra semiovale bilaterally. Elsewhere gray-white compartments appear normal. No evident acute infarct. Vascular: There is no appreciable hyperdense vessel. There is calcification in carotid siphon. Skull: The bony calvarium appears intact. Sinuses/Orbits: There is mucosal thickening in several ethmoid air cells bilaterally. Other visualized paranasal sinuses are clear. Orbits bilaterally appear symmetric. Other: Visualized mastoid air cells are clear. IMPRESSION: Mild atrophy with mild periventricular small vessel disease. No intracranial mass hemorrhage, or extra-axial fluid collection. No acute infarct evident. Foci of arterial vascular calcification noted. Mild ethmoid sinus disease. Electronically Signed   By: Lowella Grip III M.D.   On: 09/19/2016 12:28   Ct Chest Wo Contrast  Result Date: 09/14/2016 CLINICAL DATA:  Increasing dyspnea. EXAM: CT CHEST WITHOUT CONTRAST TECHNIQUE: Multidetector CT imaging of the chest was performed following the standard protocol without IV contrast. COMPARISON:  Chest radiograph 09/14/2016 FINDINGS: Cardiovascular: Mildly enlarged heart size. No pericardial effusion. Heavy calcific atherosclerotic disease of the aorta and coronary arteries. Tortuosity of the aorta.  Mediastinum/Nodes: Bilateral mediastinal lymph nodes with borderline thickening measuring up to 1.5 cm short axis. Similarly mildly prominent bilateral hilar lymph nodes. Lungs/Pleura: Large in size right pleural effusion. Right lower lobe peribronchial airspace consolidation. Minimal left pleural effusion. Minimal interstitial pulmonary edema. Upper Abdomen: Cholelithiasis. Visualized portion of the upper abdominal organs otherwise normal. Musculoskeletal: No chest wall mass or suspicious bone lesions identified. IMPRESSION: Mildly enlarged cardiac silhouette. Calcific atherosclerotic disease of the coronary arteries. Large right pleural effusion. Right lower lobe peribronchial airspace consolidation may represent atelectasis or infectious consolidation. Tiny left pleural effusion. Minimal interstitial pulmonary edema. Mediastinal and hilar lymphadenopathy, which may be reactive or malignant. Aortic Atherosclerosis (ICD10-I70.0). Electronically Signed   By: Fidela Salisbury M.D.   On: 09/14/2016 14:45   Dg Chest Port 1 View  Result Date: 09/18/2016 CLINICAL DATA:  Pt's oxygen decreased. Pt is SOB. Hx of CHF, COPD, Diabetes and HTN. Hx of dialysis catheter. EXAM: PORTABLE CHEST - 1 VIEW COMPARISON:  09/17/2016 FINDINGS: Improvement in the patchy right lower lung airspace disease. Interstitial opacities in both lung bases are stable. No new infiltrate or overt edema. Heart size appears mildly enlarged for technique. Atheromatous aorta. No effusion.  No pneumothorax. Visualized bones unremarkable. IMPRESSION: Improving right lower lobe infiltrate. Aortic Atherosclerosis (ICD10-170.0) Electronically Signed   By: Lucrezia Europe M.D.   On: 09/18/2016 08:21   Dg Chest Portable 1 View  Result Date: 09/14/2016 CLINICAL DATA:  POST THORACENTESIS. EXAM: PORTABLE CHEST 1 VIEW COMPARISON:  09/14/2016 FINDINGS: The heart is enlarged. There are interstitial changes consistent with pulmonary edema. There has been  improvement in the aeration at right lung base since the prior study. No pneumothorax. IMPRESSION: Interstitial pulmonary edema. Improved aeration in the right lower lobe.  No pneumothorax. Electronically Signed   By: Nolon Nations M.D.   On: 09/14/2016 17:27    Assessment/Plan:   1. HCAP (healthcare-associated pneumonia)  Continue IS/TCDB  Continue Duonebs prn  Continue PT/OT   Continue exercises as taught by PT/OT  Home Palliative Care  Continue O2 2L [place]  Follow up with PCP asap after discharge for continuity of care  2. Anxiety  Continue Seroquel 25 mg po BID  Follow up with PCP for ongoing medication monitoring   Patient is being discharged with the following home health services: HHPT/OT/NSG through [facility] in the home via [facility]  Patient is being discharged with the following durable medical equipment:  none  Patient has been advised  to f/u with their PCP in 1-2 weeks to bring them up to date on their rehab stay.  Social services at facility was responsible for arranging this appointment.  Pt was provided with a 30 day supply of prescriptions for medications and refills must be obtained from their PCP.  For controlled substances, a more limited supply may be provided adequate until PCP appointment only.  Future labs/tests needed:  Per pcp  Family/ staff Communication:   Total Time: 45 minutes  Documentation: 10 minutes  Face to Face: 10 miutes  Family/Phone: 25 minutes with daughter  Vikki Ports, NP-C [facility] [address], [place] [address] Cell Phone (Mon-Fri 8am-5pm):  [phone] On Call:  [phone] & follow prompts after 5pm & weekends Office Phone:  [phone] Office Fax:  [phone]

## 2016-10-10 ENCOUNTER — Ambulatory Visit
Admission: RE | Admit: 2016-10-10 | Discharge: 2016-10-10 | Disposition: A | Discharge status: Home / Self Care | Payer: Medicare Other | Source: Ambulatory Visit | Attending: Nephrology | Admitting: Nephrology

## 2016-10-10 ENCOUNTER — Ambulatory Visit
Admission: RE | Admit: 2016-10-10 | Discharge: 2016-10-10 | Disposition: A | Discharge status: Home / Self Care | Payer: Medicare Other | Source: Ambulatory Visit | Attending: Interventional Radiology | Admitting: Interventional Radiology

## 2016-10-10 DIAGNOSIS — F419 Anxiety disorder, unspecified: Secondary | ICD-10-CM | POA: Insufficient documentation

## 2016-10-10 DIAGNOSIS — J90 Pleural effusion, not elsewhere classified: Secondary | ICD-10-CM | POA: Diagnosis not present

## 2016-10-10 DIAGNOSIS — I700 Atherosclerosis of aorta: Secondary | ICD-10-CM | POA: Diagnosis not present

## 2016-10-10 DIAGNOSIS — Z9889 Other specified postprocedural states: Secondary | ICD-10-CM | POA: Insufficient documentation

## 2016-10-10 LAB — BODY FLUID CELL COUNT WITH DIFFERENTIAL
EOS FL: 0 %
LYMPHS FL: 95 %
MONOCYTE-MACROPHAGE-SEROUS FLUID: 4 %
NEUTROPHIL FLUID: 1 %
WBC FLUID: 846 uL

## 2016-10-10 LAB — PATHOLOGIST SMEAR REVIEW

## 2016-10-10 NOTE — Progress Notes (Signed)
Location:      Place of Service:  SNF (31) Provider:  Toni Arthurs, NP-C  [name], MD  Patient Care Team: [name], MD as PCP - General (Internal Medicine)  Extended Emergency Contact Information Primary Emergency Contact: [facility] Address: [address]          [place], [place] [address] Johnnette Litter of [place] Phone: [phone] Relation: Daughter Secondary Emergency Contact: Emmaline Life [place] Phone: [phone] Relation: Daughter  Code Status:  Full Goals of care: Advanced Directive information Advanced Directives 10/08/2016  Does Patient Have a Medical Advance Directive? No  Type of Advance Directive -  Does patient want to make changes to medical advance directive? -  Copy of [place] in Chart? -     Chief Complaint  Patient presents with  . Acute Visit    HPI:  Pt is a 76 y.o. male seen today for an acute visit for cough and dyspnea. Pt c/o feeling like he can't breathe. O2 sats WNL. No obvious dsypnea or labored breathing. Pt notified nursing this morning of dyspnea. I had given a verbal order for CXR. Pt refused the CXR saying he wanted a C T-scan of the chest b/c "last time, they didn't find my PNA until they did a CT. It didn't show up on CXR. I want a CT!" Pt was educated on why CT was not going to be done initially (screen with CXR, cost, would not get done until likely next week, unnecessary, etc). He was not happy about this. After several hours, he finally consented to CXR upon return from dialysis. Upon assessment, pt was calm and in NAD. CXR results were not available at the time of the assessment. VSS.    Past Medical History:  Diagnosis Date  . Cancer [facility])    Prostate  . CHF (congestive heart failure) ([place])   . Chronic kidney disease   . Complication of anesthesia    hard to wake up  . COPD (chronic obstructive pulmonary disease) ([place])   . Diabetes mellitus without  complication ([place])   . Difficult intubation   . Dyspnea   . GERD (gastroesophageal reflux disease)   . Hyperlipidemia   . Hypertension    Past Surgical History:  Procedure Laterality Date  . APPENDECTOMY    . AV FISTULA PLACEMENT Left 05/30/2016   Procedure: ARTERIOVENOUS (AV) FISTULA CREATION ( BRACHIOCEPHALIC );  Surgeon: [name], MD;  Location: [facility] ORS;  Service: Vascular;  Laterality: Left;  . CAPD INSERTION N/A 05/30/2016   Procedure: LAPAROSCOPIC INSERTION CONTINUOUS AMBULATORY PERITONEAL DIALYSIS  (CAPD) CATHETER;  Surgeon: [name], MD;  Location: [facility] ORS;  Service: Vascular;  Laterality: N/A;  . DIALYSIS/PERMA CATHETER INSERTION    . DIALYSIS/PERMA CATHETER REMOVAL N/A 08/21/2016   Procedure: Dialysis/Perma Catheter Removal;  Surgeon: [name], MD;  Location: [place] CV LAB;  Service: Cardiovascular;  Laterality: N/A;  . JOINT REPLACEMENT     right knee   . LEFT HEART CATH AND CORONARY ANGIOGRAPHY N/A 08/21/2016   Procedure: Left Heart Cath and Coronary Angiography possible PCI;  Surgeon: [name], MD;  Location: [place] CV LAB;  Service: Cardiovascular;  Laterality: N/A;  . postate removal     . PROSTATE SURGERY    . PSEUDOANERYSM COMPRESSION Right 08/27/2016   Procedure: Pseudoanerysm Compression;  Surgeon: Katha Cabal, MD;  Location: [place] CV LAB;  Service: Cardiovascular;  Laterality: Right;  .  REMOVAL OF A DIALYSIS CATHETER N/A 08/08/2016   Procedure: REMOVAL OF A DIALYSIS CATHETER;  Surgeon: [name], MD;  Location: [facility] ORS;  Service: Vascular;  Laterality: N/A;    Allergies  Allergen Reactions  . Tetracyclines & Related Other (See Comments)    Reaction: unknown happened a long time ago and pt doesn't remember reaction   . Morphine Other (See Comments)    Reaction: confusion    Allergies as of 09/27/2016      Reactions   Tetracyclines & Related Other (See Comments)   Reaction: unknown happened a long time ago and  pt doesn't remember reaction    Morphine Other (See Comments)   Reaction: confusion      Medication List       Accurate as of 09/27/16 11:59 PM. Always use your most recent med list.          acetaminophen 325 MG tablet Commonly known as:  TYLENOL Take 2 tablets (650 mg total) by mouth every 6 (six) hours as needed for mild pain (or Fever >/= 101).   amLODipine 5 MG tablet Commonly known as:  NORVASC Take 5 mg by mouth at bedtime.   calcium acetate 667 MG capsule Commonly known as:  PHOSLO Take 1,334 mg by mouth 3 (three) times daily with meals. 2 caps at 8 am, 12 pm, 5 pm   carvedilol 6.25 MG tablet Commonly known as:  COREG Take 6.25 mg by mouth 2 (two) times daily. 8 am, 5 pm   doxazosin 8 MG tablet Commonly known as:  CARDURA Take 8 mg by mouth daily.   furosemide 40 MG tablet Commonly known as:  LASIX Take 40 mg by mouth daily. 8 am   guaiFENesin-dextromethorphan 100-10 MG/5ML syrup Commonly known as:  ROBITUSSIN DM Take 5 mLs by mouth every 4 (four) hours as needed for cough.   insulin aspart 100 UNIT/ML injection Commonly known as:  novoLOG Inject 0-9 Units into the skin 3 (three) times daily with meals. CBG < 70: implement hypoglycemia protocol CBG 70 - 120: 0 units CBG 121 - 150: 1 unit CBG 151 - 200: 2 units CBG 201 - 250: 3 units CBG 251 - 300: 5 units CBG 301 - 350: 7 units CBG 351 - 400: 9 units CBG > 400: call MD and obtain STAT lab verification   insulin detemir 100 UNIT/ML injection Commonly known as:  LEVEMIR Inject 0.3 mLs (30 Units total) into the skin at bedtime.   ipratropium-albuterol 0.5-2.5 (3) MG/3ML Soln Commonly known as:  DUONEB Take 3 mLs by nebulization every 6 (six) hours as needed.   isosorbide mononitrate 30 MG 24 hr tablet Commonly known as:  IMDUR Take 1 tablet (30 mg total) by mouth daily.   lisinopril 20 MG tablet Commonly known as:  PRINIVIL,ZESTRIL Take 20 mg by mouth daily. 8 am   omeprazole 20 MG capsule Commonly  known as:  PRILOSEC Take 20 mg by mouth daily.   ondansetron 4 MG tablet Commonly known as:  ZOFRAN Take 1 tablet (4 mg total) by mouth every 6 (six) hours as needed for nausea.   spironolactone 25 MG tablet Commonly known as:  ALDACTONE Take 25 mg by mouth daily. 8 am       Review of Systems  Constitutional: Negative for activity change, appetite change, chills, diaphoresis and fever.  HENT: Negative for congestion, sneezing, sore throat, trouble swallowing and voice change.   Respiratory: Positive for cough and shortness of breath. Negative for  apnea, choking, chest tightness and wheezing.   Cardiovascular: Negative for chest pain, palpitations and leg swelling.  Gastrointestinal: Negative for abdominal distention, abdominal pain, constipation, diarrhea and nausea.  Genitourinary: Negative for difficulty urinating, dysuria, frequency and urgency.  Musculoskeletal: Positive for arthralgias (typical arthritis). Negative for back pain, gait problem and myalgias.  Skin: Negative for color change, pallor, rash and wound.  Neurological: Negative for dizziness, tremors, syncope, speech difficulty, weakness, numbness and headaches.  Psychiatric/Behavioral: Positive for agitation. Negative for behavioral problems. The patient is nervous/anxious.   All other systems reviewed and are negative.    There is no immunization history on file for this patient. Pertinent  Health Maintenance Due  Topic Date Due  . HEMOGLOBIN A1C  1940-12-04  . FOOT EXAM  05/14/1950  . OPHTHALMOLOGY EXAM  [birth year]14/1950  . PNA vac Low Risk Adult (1 of 2 - PCV13) 05/15/2003  . INFLUENZA VACCINE  10/30/2016   No flowsheet data found. Functional Status Survey:    Vitals:   09/27/16 0540  BP: (!) 167/62  Pulse: 75  Resp: 20  Temp: 97.7 F (36.5 C)  SpO2: 96%  Weight: 231 lb 3.2 oz (104.9 kg)   Body mass index is 34.14 kg/m. Physical Exam  Constitutional: He is oriented to person, place, and time. Vital  signs are normal. He appears well-developed and well-nourished. He is active and cooperative. He does not appear ill. No distress. Nasal cannula in place.  HENT:  Head: Normocephalic and atraumatic.  Mouth/Throat: Uvula is midline, oropharynx is clear and moist and mucous membranes are normal. Mucous membranes are not pale, not dry and not cyanotic.  Eyes: Pupils are equal, round, and reactive to light. Conjunctivae, EOM and lids are normal.  Neck: Trachea normal, normal range of motion and full passive range of motion without pain. Neck supple. No JVD present. No tracheal deviation, no edema and no erythema present. No thyromegaly present.  Cardiovascular: Normal rate, regular rhythm, normal heart sounds, intact distal pulses and normal pulses.  Exam reveals no gallop, no distant heart sounds and no friction rub.   No murmur heard. Pulses:      Dorsalis pedis pulses are 2+ on the right side, and 2+ on the left side.  2+ BLE pitting edema  Pulmonary/Chest: Effort normal. No accessory muscle usage. No respiratory distress. He has decreased breath sounds in the right lower field and the left lower field. He has no wheezes. He has no rhonchi. He has no rales. He exhibits no tenderness.  Abdominal: Normal appearance and bowel sounds are normal. He exhibits no distension and no ascites. There is no tenderness.  Musculoskeletal: Normal range of motion. He exhibits no edema or tenderness.  Expected osteoarthritis, stiffness  Neurological: He is alert and oriented to person, place, and time. He has normal strength.  Skin: Skin is warm, dry and intact. He is not diaphoretic. No cyanosis. No pallor. Nails show no clubbing.  Psychiatric: His speech is normal and behavior is normal. Judgment and thought content normal. His mood appears anxious. Cognition and memory are normal.  Nursing note and vitals reviewed.   Labs reviewed:  Recent Labs  08/21/16 1610  08/27/16 0417  09/18/16 1200 09/19/16 0418  09/21/16 0510  NA 134*  < > 138  < > 130* 136 135  K 4.0  < > 3.9  < > 4.5 4.1 3.5  CL 98*  < > 99*  < > 94* 97* 95*  CO2 26  < > 30  < >  26 31 30   GLUCOSE 300*  < > 161*  < > 168* 148* 171*  BUN 44*  < > 37*  < > 62* 45* 42*  CREATININE 6.41*  < > 5.23*  < > 7.61* 6.05* 5.32*  CALCIUM 8.5*  < > 8.5*  < > 8.6* 8.6* 8.3*  PHOS 5.2*  --  3.7  --  4.8*  --   --   < > = values in this interval not displayed.  Recent Labs  09/15/16 0510 09/18/16 1200 09/19/16 0418 09/21/16 0510  AST 13*  --  11* 18  ALT 13*  --  11* 10*  ALKPHOS 54  --  43 49  BILITOT 0.9  --  0.8 0.9  PROT 6.5  --  7.0 6.6  ALBUMIN 3.4* 3.3* 3.3* 3.1*    Recent Labs  08/26/16 0046  09/14/16 1249  09/18/16 1200 09/19/16 0418 09/21/16 0510  WBC 9.9  < > 8.2  < > 8.2 8.0 7.9  NEUTROABS 6.6*  --  5.8  --   --   --  5.2  HGB 10.1*  < > 10.3*  < > 8.8* 10.2* 10.3*  HCT 29.7*  < > 29.4*  < > 26.5* 30.0* 29.5*  MCV 88.6  < > 86.9  < > 88.8 89.2 88.1  PLT 146*  < > 175  < > 146* 145* 145*  < > = values in this interval not displayed. No results found for: TSH No results found for: HGBA1C No results found for: CHOL, HDL, LDLCALC, LDLDIRECT, TRIG, CHOLHDL  Significant Diagnostic Results in last 30 days:  Dg Chest 2 View  Result Date: 09/17/2016 CLINICAL DATA:  Shortness of breath. History of CHF and COPD. Former smoker. EXAM: CHEST  2 VIEW COMPARISON:  PA and lateral chest x-ray of September 15, 2016 FINDINGS: Dense infiltrate in the right lower lobe has improved. There is persistent increased interstitial density within both lungs. The heart is normal in size. The pulmonary vascularity is not engorged. There is calcification in the wall of the aortic arch. The bony thorax exhibits no acute abnormality. IMPRESSION: Improving right lower lobe pneumonia. Persistent increased interstitial markings likely reflect the patient's COPD and smoking history. Thoracic aortic atherosclerosis. Electronically Signed   By: David  Martinique  M.D.   On: 09/17/2016 08:23   Dg Chest 2 View  Result Date: 09/15/2016 CLINICAL DATA:  SOB EXAM: CHEST  2 VIEW COMPARISON:  09/14/2016 FINDINGS: Heart size is upper limits normal. Right lower lobe infiltrate is again noted. Prominent interstitial markings appear stable. IMPRESSION: Persistent right lower lobe infiltrate. Electronically Signed   By: Nolon Nations M.D.   On: 09/15/2016 10:51   Dg Chest 2 View  Result Date: 09/14/2016 CLINICAL DATA:  Dyspnea EXAM: CHEST  2 VIEW COMPARISON:  08/19/2016 chest radiograph. FINDINGS: Stable cardiomediastinal silhouette with mild cardiomegaly and aortic atherosclerosis. No pneumothorax. Small right pleural effusion. Trace left pleural effusion. Borderline mild pulmonary edema. Patchy right lung base opacity. IMPRESSION: 1. Borderline mild congestive heart failure. 2. Small right and trace left pleural effusions. 3. Patchy right lung base opacity, favor atelectasis, difficult to exclude a component of aspiration or pneumonia. Electronically Signed   By: Ilona Sorrel M.D.   On: 09/14/2016 13:50   Ct Head Wo Contrast  Result Date: 09/19/2016 CLINICAL DATA:  Altered mental status.  Hypoxia. EXAM: CT HEAD WITHOUT CONTRAST TECHNIQUE: Contiguous axial images were obtained from the base of the skull through the vertex without intravenous  contrast. COMPARISON:  None. FINDINGS: Brain: There is mild diffuse atrophy. There is no intracranial mass, hemorrhage, extra-axial fluid collection, or midline shift. There is mild small vessel disease in the centra semiovale bilaterally. Elsewhere gray-white compartments appear normal. No evident acute infarct. Vascular: There is no appreciable hyperdense vessel. There is calcification in carotid siphon. Skull: The bony calvarium appears intact. Sinuses/Orbits: There is mucosal thickening in several ethmoid air cells bilaterally. Other visualized paranasal sinuses are clear. Orbits bilaterally appear symmetric. Other: Visualized  mastoid air cells are clear. IMPRESSION: Mild atrophy with mild periventricular small vessel disease. No intracranial mass hemorrhage, or extra-axial fluid collection. No acute infarct evident. Foci of arterial vascular calcification noted. Mild ethmoid sinus disease. Electronically Signed   By: Lowella Grip III M.D.   On: 09/19/2016 12:28   Ct Chest Wo Contrast  Result Date: 09/14/2016 CLINICAL DATA:  Increasing dyspnea. EXAM: CT CHEST WITHOUT CONTRAST TECHNIQUE: Multidetector CT imaging of the chest was performed following the standard protocol without IV contrast. COMPARISON:  Chest radiograph 09/14/2016 FINDINGS: Cardiovascular: Mildly enlarged heart size. No pericardial effusion. Heavy calcific atherosclerotic disease of the aorta and coronary arteries. Tortuosity of the aorta. Mediastinum/Nodes: Bilateral mediastinal lymph nodes with borderline thickening measuring up to 1.5 cm short axis. Similarly mildly prominent bilateral hilar lymph nodes. Lungs/Pleura: Large in size right pleural effusion. Right lower lobe peribronchial airspace consolidation. Minimal left pleural effusion. Minimal interstitial pulmonary edema. Upper Abdomen: Cholelithiasis. Visualized portion of the upper abdominal organs otherwise normal. Musculoskeletal: No chest wall mass or suspicious bone lesions identified. IMPRESSION: Mildly enlarged cardiac silhouette. Calcific atherosclerotic disease of the coronary arteries. Large right pleural effusion. Right lower lobe peribronchial airspace consolidation may represent atelectasis or infectious consolidation. Tiny left pleural effusion. Minimal interstitial pulmonary edema. Mediastinal and hilar lymphadenopathy, which may be reactive or malignant. Aortic Atherosclerosis (ICD10-I70.0). Electronically Signed   By: Fidela Salisbury M.D.   On: 09/14/2016 14:45   Dg Chest Port 1 View  Result Date: 10/10/2016 CLINICAL DATA:  Post right-sided thoracentesis EXAM: PORTABLE CHEST 1  VIEW COMPARISON:  Chest radiograph - 09/10/2016; 09/18/2016; 09/14/2016; chest CT -09/14/2016 FINDINGS: Grossly unchanged borderline enlarged cardiac silhouette and mediastinal contours with atherosclerotic plaque within the thoracic aorta. Interval reduction / resolution of right-sided pleural effusion post thoracentesis. Unchanged suspected trace left-sided pleural effusion. Improved aeration of the right lower lung without new focal airspace opacity. No pneumothorax. No evidence of edema. No acute osseus abnormalities. IMPRESSION: 1. Interval reduction/resolution of right-sided effusion post thoracentesis. No pneumothorax. 2. Improved aeration of the right lower lung suggests resolved atelectasis. 3. Unchanged suspected trace left-sided effusion. 4.  Aortic Atherosclerosis (ICD10-I70.0). Electronically Signed   By: Sandi Mariscal M.D.   On: 10/10/2016 11:49   Dg Chest Port 1 View  Result Date: 09/18/2016 CLINICAL DATA:  Pt's oxygen decreased. Pt is SOB. Hx of CHF, COPD, Diabetes and HTN. Hx of dialysis catheter. EXAM: PORTABLE CHEST - 1 VIEW COMPARISON:  09/17/2016 FINDINGS: Improvement in the patchy right lower lung airspace disease. Interstitial opacities in both lung bases are stable. No new infiltrate or overt edema. Heart size appears mildly enlarged for technique. Atheromatous aorta. No effusion.  No pneumothorax. Visualized bones unremarkable. IMPRESSION: Improving right lower lobe infiltrate. Aortic Atherosclerosis (ICD10-170.0) Electronically Signed   By: Lucrezia Europe M.D.   On: 09/18/2016 08:21   Dg Chest Portable 1 View  Result Date: 09/14/2016 CLINICAL DATA:  POST THORACENTESIS. EXAM: PORTABLE CHEST 1 VIEW COMPARISON:  09/14/2016 FINDINGS: The heart is enlarged. There are  interstitial changes consistent with pulmonary edema. There has been improvement in the aeration at right lung base since the prior study. No pneumothorax. IMPRESSION: Interstitial pulmonary edema. Improved aeration in the right  lower lobe.  No pneumothorax. Electronically Signed   By: Nolon Nations M.D.   On: 09/14/2016 17:27   US Thoracentesis Asp Pleural Space W/img Guide  Result Date: 10/10/2016 INDICATION: Symptomatic right sided pleural effusion EXAM: US THORACENTESIS ASP PLEURAL SPACE W/IMG GUIDE COMPARISON:  Chest radiograph - 09/10/2016 MEDICATIONS: None. COMPLICATIONS: None immediate. TECHNIQUE: Informed written consent was obtained from the patient after a discussion of the risks, benefits and alternatives to treatment. A timeout was performed prior to the initiation of the procedure. Initial ultrasound scanning demonstrates a moderate pleural effusion. The lower chest was prepped and draped in the usual sterile fashion. 1% lidocaine was used for local anesthesia. An ultrasound image was saved for documentation purposes. An 8 Fr Safe-T-Centesis catheter was introduced. The thoracentesis was performed. The catheter was removed and a dressing was applied. The patient tolerated the procedure well without immediate post procedural complication. The patient was escorted to have an upright chest radiograph. FINDINGS: A total of approximately 1.2 liters of serous fluid was removed. Samples were sent to the laboratory as requested by the ordering team. IMPRESSION: Successful ultrasound-guided right sided thoracentesis yielding 1.2 liters of pleural fluid. Electronically Signed   By: Sandi Mariscal M.D.   On: 10/10/2016 11:50    Assessment/Plan 1. HCAP (healthcare-associated pneumonia)  Levaquin 750 mg po x 1 tonight, then  Levaquin 500 mg po EOD x 6 days (renally adjusted)  Duonebs TID x 3 days scheduled  Continue O2 2 L [place]  Encourage TCBD  Family/ staff Communication:   Total Time:  Documentation:  Face to Face:  Family/Phone:   Labs/tests ordered:  2- view cxr  Medication list reviewed and assessed for continued appropriateness.  Vikki Ports, NP-C Geriatrics [facility] [phone], [place] [address] Cell Phone (Mon-Fri 8am-5pm):  [phone] On Call:  [phone] & follow prompts after 5pm & weekends Office Phone:  [phone] Office Fax:  [phone]

## 2016-10-10 NOTE — Procedures (Signed)
Pre procedural Dx: Symptomatic Pleural effusion Post procedural Dx: Same  Successful US guided right sided thoracentesis yielding 1.2 L of serous pleural fluid.   Samples sent to lab for analysis.  EBL: None  Complications: None immediate.  Jay Nicklous Aburto, MD Pager #: [phone]   

## 2016-10-16 ENCOUNTER — Emergency Department: Payer: Medicare Other

## 2016-10-16 ENCOUNTER — Inpatient Hospital Stay
Admission: EM | Admit: 2016-10-16 | Discharge: 2016-10-20 | DRG: 291 | Disposition: A | Discharge status: Home Health | Payer: Medicare Other | Attending: Internal Medicine | Admitting: Internal Medicine

## 2016-10-16 ENCOUNTER — Encounter: Payer: Self-pay | Admitting: Emergency Medicine

## 2016-10-16 DIAGNOSIS — N2581 Secondary hyperparathyroidism of renal origin: Secondary | ICD-10-CM | POA: Diagnosis present

## 2016-10-16 DIAGNOSIS — R590 Localized enlarged lymph nodes: Secondary | ICD-10-CM | POA: Diagnosis not present

## 2016-10-16 DIAGNOSIS — Z96651 Presence of right artificial knee joint: Secondary | ICD-10-CM | POA: Diagnosis present

## 2016-10-16 DIAGNOSIS — Z794 Long term (current) use of insulin: Secondary | ICD-10-CM

## 2016-10-16 DIAGNOSIS — Y95 Nosocomial condition: Secondary | ICD-10-CM | POA: Diagnosis present

## 2016-10-16 DIAGNOSIS — Z9981 Dependence on supplemental oxygen: Secondary | ICD-10-CM | POA: Diagnosis not present

## 2016-10-16 DIAGNOSIS — I132 Hypertensive heart and chronic kidney disease with heart failure and with stage 5 chronic kidney disease, or end stage renal disease: Secondary | ICD-10-CM | POA: Diagnosis present

## 2016-10-16 DIAGNOSIS — J449 Chronic obstructive pulmonary disease, unspecified: Secondary | ICD-10-CM | POA: Diagnosis present

## 2016-10-16 DIAGNOSIS — J9600 Acute respiratory failure, unspecified whether with hypoxia or hypercapnia: Secondary | ICD-10-CM | POA: Diagnosis present

## 2016-10-16 DIAGNOSIS — Z87891 Personal history of nicotine dependence: Secondary | ICD-10-CM | POA: Diagnosis not present

## 2016-10-16 DIAGNOSIS — J90 Pleural effusion, not elsewhere classified: Secondary | ICD-10-CM | POA: Diagnosis not present

## 2016-10-16 DIAGNOSIS — Z8546 Personal history of malignant neoplasm of prostate: Secondary | ICD-10-CM | POA: Diagnosis not present

## 2016-10-16 DIAGNOSIS — I5033 Acute on chronic diastolic (congestive) heart failure: Secondary | ICD-10-CM | POA: Diagnosis present

## 2016-10-16 DIAGNOSIS — R0609 Other forms of dyspnea: Secondary | ICD-10-CM

## 2016-10-16 DIAGNOSIS — K219 Gastro-esophageal reflux disease without esophagitis: Secondary | ICD-10-CM | POA: Diagnosis present

## 2016-10-16 DIAGNOSIS — Z7982 Long term (current) use of aspirin: Secondary | ICD-10-CM

## 2016-10-16 DIAGNOSIS — J9621 Acute and chronic respiratory failure with hypoxia: Secondary | ICD-10-CM | POA: Diagnosis not present

## 2016-10-16 DIAGNOSIS — Z992 Dependence on renal dialysis: Secondary | ICD-10-CM

## 2016-10-16 DIAGNOSIS — E1122 Type 2 diabetes mellitus with diabetic chronic kidney disease: Secondary | ICD-10-CM | POA: Diagnosis present

## 2016-10-16 DIAGNOSIS — N186 End stage renal disease: Secondary | ICD-10-CM | POA: Diagnosis present

## 2016-10-16 DIAGNOSIS — Z8701 Personal history of pneumonia (recurrent): Secondary | ICD-10-CM | POA: Diagnosis not present

## 2016-10-16 DIAGNOSIS — Z9079 Acquired absence of other genital organ(s): Secondary | ICD-10-CM | POA: Diagnosis not present

## 2016-10-16 DIAGNOSIS — R0602 Shortness of breath: Secondary | ICD-10-CM

## 2016-10-16 DIAGNOSIS — I2510 Atherosclerotic heart disease of native coronary artery without angina pectoris: Secondary | ICD-10-CM | POA: Diagnosis present

## 2016-10-16 DIAGNOSIS — Z79899 Other long term (current) drug therapy: Secondary | ICD-10-CM | POA: Diagnosis not present

## 2016-10-16 DIAGNOSIS — E785 Hyperlipidemia, unspecified: Secondary | ICD-10-CM | POA: Diagnosis present

## 2016-10-16 DIAGNOSIS — D631 Anemia in chronic kidney disease: Secondary | ICD-10-CM | POA: Diagnosis present

## 2016-10-16 DIAGNOSIS — J189 Pneumonia, unspecified organism: Secondary | ICD-10-CM | POA: Diagnosis present

## 2016-10-16 DIAGNOSIS — J811 Chronic pulmonary edema: Secondary | ICD-10-CM | POA: Diagnosis not present

## 2016-10-16 HISTORY — DX: Disorder of kidney and ureter, unspecified: N28.9

## 2016-10-16 HISTORY — DX: Pleural effusion, not elsewhere classified: J90

## 2016-10-16 LAB — CBC WITH DIFFERENTIAL/PLATELET
BASOS PCT: 1 %
Basophils Absolute: 0 10*3/uL (ref 0–0.1)
EOS ABS: 0.3 10*3/uL (ref 0–0.7)
Eosinophils Relative: 5 %
HCT: 27.4 % — ABNORMAL LOW (ref 40.0–52.0)
HEMOGLOBIN: 9.4 g/dL — AB (ref 13.0–18.0)
Lymphocytes Relative: 20 %
Lymphs Abs: 1.3 10*3/uL (ref 1.0–3.6)
MCH: 29.7 pg (ref 26.0–34.0)
MCHC: 34.2 g/dL (ref 32.0–36.0)
MCV: 87 fL (ref 80.0–100.0)
MONO ABS: 0.5 10*3/uL (ref 0.2–1.0)
MONOS PCT: 7 %
NEUTROS PCT: 67 %
Neutro Abs: 4.4 10*3/uL (ref 1.4–6.5)
PLATELETS: 152 10*3/uL (ref 150–440)
RBC: 3.15 MIL/uL — ABNORMAL LOW (ref 4.40–5.90)
RDW: 14.4 % (ref 11.5–14.5)
WBC: 6.6 10*3/uL (ref 3.8–10.6)

## 2016-10-16 LAB — COMPREHENSIVE METABOLIC PANEL
ALBUMIN: 3.2 g/dL — AB (ref 3.5–5.0)
ALT: 12 U/L — ABNORMAL LOW (ref 17–63)
ANION GAP: 8 (ref 5–15)
AST: 20 U/L (ref 15–41)
Alkaline Phosphatase: 43 U/L (ref 38–126)
BUN: 13 mg/dL (ref 6–20)
CHLORIDE: 99 mmol/L — AB (ref 101–111)
CO2: 30 mmol/L (ref 22–32)
Calcium: 8.6 mg/dL — ABNORMAL LOW (ref 8.9–10.3)
Creatinine, Ser: 2.38 mg/dL — ABNORMAL HIGH (ref 0.61–1.24)
GFR calc Af Amer: 28 mL/min — ABNORMAL LOW (ref >60)
GFR calc non Af Amer: 25 mL/min — ABNORMAL LOW (ref >60)
Glucose, Bld: 157 mg/dL — ABNORMAL HIGH (ref 65–99)
POTASSIUM: 2.9 mmol/L — AB (ref 3.5–5.1)
SODIUM: 137 mmol/L (ref 135–145)
Total Bilirubin: 0.6 mg/dL (ref 0.3–1.2)
Total Protein: 6.5 g/dL (ref 6.5–8.1)

## 2016-10-16 LAB — BRAIN NATRIURETIC PEPTIDE: B Natriuretic Peptide: 900 pg/mL — ABNORMAL HIGH (ref 0.0–100.0)

## 2016-10-16 LAB — LACTIC ACID, PLASMA: Lactic Acid, Venous: 1 mmol/L (ref 0.5–1.9)

## 2016-10-16 LAB — TROPONIN I: Troponin I: <0.03 ng/mL (ref <0.03)

## 2016-10-16 MED ORDER — ASPIRIN 81 MG PO CHEW
81.0000 mg | CHEWABLE_TABLET | Freq: Every day | ORAL | Status: DC
  Administered 2016-10-17 – 2016-10-20 (×4): 81 mg via ORAL
  Filled 2016-10-16 (×4): qty 1

## 2016-10-16 MED ORDER — INSULIN ASPART 100 UNIT/ML ~~LOC~~ SOLN
0.0000 [IU] | Freq: Three times a day (TID) | SUBCUTANEOUS | Status: DC
  Administered 2016-10-17 – 2016-10-18 (×4): 2 [IU] via SUBCUTANEOUS
  Administered 2016-10-18: 1 [IU] via SUBCUTANEOUS
  Administered 2016-10-19 (×2): 2 [IU] via SUBCUTANEOUS
  Administered 2016-10-20: 3 [IU] via SUBCUTANEOUS
  Filled 2016-10-16 (×9): qty 1

## 2016-10-16 MED ORDER — VANCOMYCIN HCL IN DEXTROSE 1-5 GM/200ML-% IV SOLN
1000.0000 mg | Freq: Once | INTRAVENOUS | Status: AC
  Administered 2016-10-16: 1000 mg via INTRAVENOUS
  Filled 2016-10-16: qty 200

## 2016-10-16 MED ORDER — CARVEDILOL 6.25 MG PO TABS
6.2500 mg | ORAL_TABLET | Freq: Two times a day (BID) | ORAL | Status: DC
  Administered 2016-10-17 – 2016-10-20 (×5): 6.25 mg via ORAL
  Filled 2016-10-16 (×6): qty 1

## 2016-10-16 MED ORDER — ISOSORBIDE MONONITRATE ER 30 MG PO TB24
30.0000 mg | ORAL_TABLET | Freq: Every day | ORAL | Status: DC
  Administered 2016-10-17 – 2016-10-20 (×4): 30 mg via ORAL
  Filled 2016-10-16 (×4): qty 1

## 2016-10-16 MED ORDER — DOXAZOSIN MESYLATE 8 MG PO TABS
8.0000 mg | ORAL_TABLET | Freq: Every day | ORAL | Status: DC
  Administered 2016-10-17 – 2016-10-20 (×4): 8 mg via ORAL
  Filled 2016-10-16 (×4): qty 1

## 2016-10-16 MED ORDER — PANTOPRAZOLE SODIUM 40 MG PO TBEC
40.0000 mg | DELAYED_RELEASE_TABLET | Freq: Every day | ORAL | Status: DC
  Administered 2016-10-17 – 2016-10-20 (×4): 40 mg via ORAL
  Filled 2016-10-16 (×4): qty 1

## 2016-10-16 MED ORDER — ACETAMINOPHEN 325 MG PO TABS
650.0000 mg | ORAL_TABLET | Freq: Four times a day (QID) | ORAL | Status: DC | PRN
  Administered 2016-10-17: 650 mg via ORAL
  Filled 2016-10-16: qty 2

## 2016-10-16 MED ORDER — INSULIN NPH (HUMAN) (ISOPHANE) 100 UNIT/ML ~~LOC~~ SUSP
30.0000 [IU] | Freq: Two times a day (BID) | SUBCUTANEOUS | Status: DC
  Filled 2016-10-16: qty 10

## 2016-10-16 MED ORDER — ONDANSETRON HCL 4 MG PO TABS: 4.0000 mg | ORAL_TABLET | Freq: Four times a day (QID) | ORAL | Status: DC | PRN

## 2016-10-16 MED ORDER — ATORVASTATIN CALCIUM 20 MG PO TABS
40.0000 mg | ORAL_TABLET | Freq: Every day | ORAL | Status: DC
  Administered 2016-10-17 – 2016-10-19 (×3): 40 mg via ORAL
  Filled 2016-10-16 (×3): qty 2

## 2016-10-16 MED ORDER — DOCUSATE SODIUM 100 MG PO CAPS: 100.0000 mg | ORAL_CAPSULE | Freq: Two times a day (BID) | ORAL | Status: DC | PRN

## 2016-10-16 MED ORDER — LISINOPRIL 20 MG PO TABS
20.0000 mg | ORAL_TABLET | Freq: Every day | ORAL | Status: DC
  Administered 2016-10-17 – 2016-10-20 (×3): 20 mg via ORAL
  Filled 2016-10-16 (×3): qty 1

## 2016-10-16 MED ORDER — SPIRONOLACTONE 25 MG PO TABS
25.0000 mg | ORAL_TABLET | Freq: Every day | ORAL | Status: DC
  Administered 2016-10-17 – 2016-10-20 (×4): 25 mg via ORAL
  Filled 2016-10-16 (×4): qty 1

## 2016-10-16 MED ORDER — CALCIUM ACETATE (PHOS BINDER) 667 MG PO CAPS
1334.0000 mg | ORAL_CAPSULE | Freq: Three times a day (TID) | ORAL | Status: DC
  Administered 2016-10-17 – 2016-10-19 (×7): 1334 mg via ORAL
  Filled 2016-10-16 (×11): qty 2

## 2016-10-16 MED ORDER — FUROSEMIDE 40 MG PO TABS
40.0000 mg | ORAL_TABLET | Freq: Every day | ORAL | Status: DC
  Administered 2016-10-17 – 2016-10-20 (×4): 40 mg via ORAL
  Filled 2016-10-16 (×4): qty 1

## 2016-10-16 MED ORDER — CEFEPIME HCL 2 G IJ SOLR
2.0000 g | Freq: Once | INTRAMUSCULAR | Status: AC
  Administered 2016-10-16: 2 g via INTRAVENOUS
  Filled 2016-10-16: qty 2

## 2016-10-16 MED ORDER — HEPARIN SODIUM (PORCINE) 5000 UNIT/ML IJ SOLN
5000.0000 [IU] | Freq: Three times a day (TID) | INTRAMUSCULAR | Status: DC
  Administered 2016-10-17 – 2016-10-20 (×9): 5000 [IU] via SUBCUTANEOUS
  Filled 2016-10-16 (×11): qty 1

## 2016-10-16 MED ORDER — IPRATROPIUM-ALBUTEROL 0.5-2.5 (3) MG/3ML IN SOLN
3.0000 mL | RESPIRATORY_TRACT | Status: DC
  Administered 2016-10-17 – 2016-10-20 (×16): 3 mL via RESPIRATORY_TRACT
  Filled 2016-10-16 (×17): qty 3

## 2016-10-16 NOTE — ED Notes (Signed)
Pt. Has edema to both lower extremities and back.  Pt. Has +1 edema to bilateral feet.

## 2016-10-16 NOTE — ED Triage Notes (Signed)
Pt. States he was here this past Thursday with same complaint.  Pt. States about one liter of fluid taken off lungs.  Pt. States difficulty breathing started this past Sunday.  Pt. He had dialysis today.  Pt. States upon light exhaustion today breathing difficulty returned.  Pt. States he is on 3L nasal all time.

## 2016-10-16 NOTE — ED Notes (Signed)
Lactic was nagiti

## 2016-10-16 NOTE — ED Provider Notes (Signed)
[facility] Emergency Department Provider Note    First MD Initiated Contact with Patient 10/16/16 1924     (approximate)  I have reviewed the triage vital signs and the nursing notes.   HISTORY  Chief Complaint Shortness of Breath (Pt. here via EMS from home.)    HPI Calvin Good is a 76 y.o. male patient history of prostate cancer as well as congestive heart failure chronic kidney disease on dialysis as well as COPD and diabetes presents with worsening dyspnea over the past several days. Patient also endorses worsening orthopnea. Patient is unable to lay flat without significant shortness of breath. Patient had a thoracentesis performed just a few days ago and feels that the fluid is already built back up. Has been compliant with his medications. Denies any chest pains. Denies any fevers. No nausea or vomiting.   Past Medical History:  Diagnosis Date  . Cancer [facility])    Prostate  . CHF (congestive heart failure) ([place])   . Chronic kidney disease   . Complication of anesthesia    hard to wake up  . COPD (chronic obstructive pulmonary disease) ([place])   . Diabetes mellitus without complication ([place])   . Difficult intubation   . Dyspnea   . GERD (gastroesophageal reflux disease)   . Hyperlipidemia   . Hypertension    Family History  Problem Relation Age of Onset  . Colon cancer Father   . Brain cancer Mother   . CAD Neg Hx   . Diabetes Neg Hx    Past Surgical History:  Procedure Laterality Date  . APPENDECTOMY    . AV FISTULA PLACEMENT Left 05/30/2016   Procedure: ARTERIOVENOUS (AV) FISTULA CREATION ( BRACHIOCEPHALIC );  Surgeon: [name], MD;  Location: [facility] ORS;  Service: Vascular;  Laterality: Left;  . CAPD INSERTION N/A 05/30/2016   Procedure: LAPAROSCOPIC INSERTION CONTINUOUS AMBULATORY PERITONEAL DIALYSIS  (CAPD) CATHETER;  Surgeon: [name], MD;  Location: [facility] ORS;  Service: Vascular;  Laterality: N/A;  . DIALYSIS/PERMA CATHETER  INSERTION    . DIALYSIS/PERMA CATHETER REMOVAL N/A 08/21/2016   Procedure: Dialysis/Perma Catheter Removal;  Surgeon: [name], MD;  Location: [place] CV LAB;  Service: Cardiovascular;  Laterality: N/A;  . JOINT REPLACEMENT     right knee   . LEFT HEART CATH AND CORONARY ANGIOGRAPHY N/A 08/21/2016   Procedure: Left Heart Cath and Coronary Angiography possible PCI;  Surgeon: [name], MD;  Location: [place] [facility];  Service: Cardiovascular;  Laterality: N/A;  . postate removal     . PROSTATE SURGERY    . PSEUDOANERYSM COMPRESSION Right 08/27/2016   Procedure: Pseudoanerysm Compression;  Surgeon: Katha Cabal, MD;  Location: [place] [facility];  Service: Cardiovascular;  Laterality: Right;  . REMOVAL OF A DIALYSIS CATHETER N/A 08/08/2016   Procedure: REMOVAL OF A DIALYSIS CATHETER;  Surgeon: [name], MD;  Location: [facility] ORS;  Service: Vascular;  Laterality: N/A;   Patient Active Problem List   Diagnosis Date Noted  . Anxiety 10/10/2016  . Acute encephalopathy   . HCAP (healthcare-associated pneumonia) 09/14/2016  . Aneurysm ([place]) 08/28/2016  . Pseudoaneurysm following procedure ([place]) 08/26/2016  . Chest tightness 08/20/2016  . GERD (gastroesophageal reflux disease) 08/20/2016  . NSTEMI (non-ST elevated myocardial infarction) ([place]) 08/20/2016  . Onychomycosis of toenail 05/13/2016  . Essential hypertension, benign 04/23/2016  . Hyperlipemia 04/23/2016  . Diabetes ([place]) 04/23/2016  . ESRD on dialysis ([place]) 04/23/2016  Prior to Admission medications   Medication Sig Start Date End Date Taking? Authorizing Provider  acetaminophen (TYLENOL) 325 MG tablet Take 2 tablets (650 mg total) by mouth every 6 (six) hours as needed for mild pain (or Fever >/= 101). 09/23/16   Gouru, Illene Silver, MD  amLODipine (NORVASC) 5 MG tablet Take 5 mg by mouth at bedtime.     [provider]  aspirin 81 MG chewable tablet Chew 81 mg by mouth daily. 8 am    [provider]  atorvastatin (LIPITOR) 40 MG tablet Take 40 mg by mouth daily. 8 pm    [provider]  calcium acetate (PHOSLO) 667 MG capsule Take 1,334 mg by mouth 3 (three) times daily with meals. 2 caps at 8 am, 12 pm, 5 pm    [provider]  carvedilol (COREG) 6.25 MG tablet Take 6.25 mg by mouth 2 (two) times daily. 8 am, 5 pm 09/05/16   [provider]  dextrose (GLUTOSE) 40 % GEL Take 1 Tube by mouth every 30 (thirty) minutes as needed for low blood sugar. Or until BS > 70 and pt is still responsive.    [provider]  doxazosin (CARDURA) 8 MG tablet Take 8 mg by mouth daily.     [provider]  furosemide (LASIX) 40 MG tablet Take 40 mg by mouth daily. 8 am    [provider]  glucagon (GLUCAGON EMERGENCY) 1 MG injection Inject 1 mg into the muscle once as needed. If not responding to oral glucose, snack. May repeat x 1 after 15 minutes. Use only if pt is or is becoming unresponsive as needed.    [provider]  guaiFENesin-dextromethorphan (ROBITUSSIN DM) 100-10 MG/5ML syrup Take 5 mLs by mouth every 4 (four) hours as needed for cough. 09/23/16   Gouru, Illene Silver, MD  insulin aspart (NOVOLOG) 100 UNIT/ML injection Inject 0-9 Units into the skin 3 (three) times daily with meals. CBG < 70: implement hypoglycemia protocol CBG 70 - 120: 0 units CBG 121 - 150: 1 unit CBG 151 - 200: 2 units CBG 201 - 250: 3 units CBG 251 - 300: 5 units CBG 301 - 350: 7 units CBG 351 - 400: 9 units CBG > 400: call MD and obtain STAT lab verification 09/23/16   Gouru, Illene Silver, MD  insulin aspart (NOVOLOG) 100 UNIT/ML injection Inject 4 Units into the skin 3 (three) times daily with meals. And hold if eating < 50 % of meal. Give in addition to sliding scale    [provider]  insulin aspart (NOVOLOG) 100 UNIT/ML injection Inject 0-5 Units into the skin at bedtime. If BS is 200 - 250 give 2 units, if 251 - 300 give 3 units, 301 - 350 4 units, if  BS > 400 give 0 units    [provider]  insulin detemir (LEVEMIR) 100 UNIT/ML injection Inject 0.3 mLs (30 Units total) into the skin at bedtime. 09/23/16   Gouru, Illene Silver, MD  ipratropium-albuterol (DUONEB) 0.5-2.5 (3) MG/3ML SOLN Take 3 mLs by nebulization every 6 (six) hours as needed. 09/23/16   Nicholes Mango, MD  isosorbide mononitrate (IMDUR) 30 MG 24 hr tablet Take 1 tablet (30 mg total) by mouth daily. 08/22/16   Dustin Flock, MD  Lidocaine-Prilocaine, Bulk, 2.5-2.5 % CREA Apply 1 application topically as needed. Apply to fistula site prior to dialysis treatments as needed    [provider]  lisinopril (PRINIVIL,ZESTRIL) 20 MG tablet Take 20 mg by mouth  daily. 8 am    [provider]  omeprazole (PRILOSEC) 20 MG capsule Take 20 mg by mouth daily.     [provider]  ondansetron (ZOFRAN) 4 MG tablet Take 1 tablet (4 mg total) by mouth every 6 (six) hours as needed for nausea. 09/23/16   Nicholes Mango, MD  OXYGEN Inhale 2 L into the lungs continuous.    [provider]  QUEtiapine (SEROQUEL) 25 MG tablet Take 25 mg by mouth 2 (two) times daily. 8 am, 8 pm    [provider]  spironolactone (ALDACTONE) 25 MG tablet Take 25 mg by mouth daily. 8 am    [provider]    Allergies Tetracyclines & related and Morphine    Social History Social History  Substance Use Topics  . Smoking status: Former Smoker    Quit date: 05/21/1982  . Smokeless tobacco: Never Used  . Alcohol use 1.8 oz/week    3 Glasses of wine per week    Review of Systems Patient denies headaches, rhinorrhea, blurry vision, numbness, shortness of breath, chest pain, edema, cough, abdominal pain, nausea, vomiting, diarrhea, dysuria, fevers, rashes or hallucinations unless otherwise stated above in HPI. ____________________________________________   PHYSICAL EXAM:  VITAL SIGNS: Vitals:   10/16/16 1937  BP: (!) 142/89  Pulse: 87  Resp: (!) 22  Temp:  98.5 F (36.9 C)    Constitutional: Alert and oriented. Ill apearing in moderate resp distress Eyes: Conjunctivae are normal.  Head: Atraumatic. Nose: No congestion/rhinnorhea. Mouth/Throat: Mucous membranes are moist.   Neck: No stridor. Painless ROM.  Cardiovascular: Normal rate, regular rhythm. Grossly normal heart sounds.  Good peripheral circulation. Respiratory: tachypnea, diminished breathsounds in Bilateral bases Gastrointestinal: Soft and nontender. No distention. No abdominal bruits. No CVA tenderness.  Musculoskeletal: No lower extremity tenderness, +  edema.  No joint effusions. Neurologic:  Normal speech and language. No gross focal neurologic deficits are appreciated. No facial droop Skin:  Skin is warm, dry and intact. No rash noted. Psychiatric: Mood and affect are normal. Speech and behavior are normal.  ____________________________________________   LABS (all labs ordered are listed, but only abnormal results are displayed)  Results for orders placed or performed during the hospital encounter of 10/16/16 (from the past 24 hour(s))  CBC with Differential/Platelet     Status: Abnormal   Collection Time: 10/16/16  7:42 PM  Result Value Ref Range   WBC 6.6 3.8 - 10.6 K/uL   RBC 3.15 (L) 4.40 - 5.90 MIL/uL   Hemoglobin 9.4 (L) 13.0 - 18.0 g/dL   HCT 27.4 (L) 40.0 - 52.0 %   MCV 87.0 80.0 - 100.0 fL   MCH 29.7 26.0 - 34.0 pg   MCHC 34.2 32.0 - 36.0 g/dL   RDW 14.4 11.5 - 14.5 %   Platelets 152 150 - 440 K/uL   Neutrophils Relative % 67 %   Neutro Abs 4.4 1.4 - 6.5 K/uL   Lymphocytes Relative 20 %   Lymphs Abs 1.3 1.0 - 3.6 K/uL   Monocytes Relative 7 %   Monocytes Absolute 0.5 0.2 - 1.0 K/uL   Eosinophils Relative 5 %   Eosinophils Absolute 0.3 0 - 0.7 K/uL   Basophils Relative 1 %   Basophils Absolute 0.0 0 - 0.1 K/uL  Comprehensive metabolic panel     Status: Abnormal   Collection Time: 10/16/16  7:42 PM  Result Value Ref Range   Sodium 137 135 - 145  mmol/L   Potassium 2.9 (L)  3.5 - 5.1 mmol/L   Chloride 99 (L) 101 - 111 mmol/L   CO2 30 22 - 32 mmol/L   Glucose, Bld 157 (H) 65 - 99 mg/dL   BUN 13 6 - 20 mg/dL   Creatinine, Ser 2.38 (H) 0.61 - 1.24 mg/dL   Calcium 8.6 (L) 8.9 - 10.3 mg/dL   Total Protein 6.5 6.5 - 8.1 g/dL   Albumin 3.2 (L) 3.5 - 5.0 g/dL   AST 20 15 - 41 U/L   ALT 12 (L) 17 - 63 U/L   Alkaline Phosphatase 43 38 - 126 U/L   Total Bilirubin 0.6 0.3 - 1.2 mg/dL   GFR calc non Af Amer 25 (L) >60 mL/min   GFR calc Af Amer 28 (L) >60 mL/min   Anion gap 8 5 - 15  Troponin I     Status: None   Collection Time: 10/16/16  7:42 PM  Result Value Ref Range   Troponin I <0.03 <0.03 ng/mL   ____________________________________________  EKG My review and personal interpretation at Time: 20:47   Indication: sob  Rate: 80  Rhythm: sinus Axis: normal Other: rbbb, no stemi, normal intervals ____________________________________________  RADIOLOGY  I personally reviewed all radiographic images ordered to evaluate for the above acute complaints and reviewed radiology reports and findings.  These findings were personally discussed with the patient.  Please see medical record for radiology report.  ____________________________________________   PROCEDURES  Procedure(s) performed:  Procedures    Critical Care performed: yes CRITICAL CARE Performed by: Merlyn Lot   Total critical care time: 30 minutes  Critical care time was exclusive of separately billable procedures and treating other patients.  Critical care was necessary to treat or prevent imminent or life-threatening deterioration.  Critical care was time spent personally by me on the following activities: development of treatment plan with patient and/or surrogate as well as nursing, discussions with consultants, evaluation of patient's response to treatment, examination of patient, obtaining history from patient or surrogate, ordering and performing  treatments and interventions, ordering and review of laboratory studies, ordering and review of radiographic studies, pulse oximetry and re-evaluation of patient's condition.  ____________________________________________   INITIAL IMPRESSION / ASSESSMENT AND PLAN / ED COURSE  Pertinent labs & imaging results that were available during my care of the patient were reviewed by me and considered in my medical decision making (see chart for details).  DDX: Asthma, copd, CHF, pna, ptx, malignancy, Pe, anemia   Hammad Finkler is a 76 y.o. who presents to the ED with shortness of breath as described above. Patient is much alert appearing than last time I saw him. Based on laboratory and radiographic testing sent for the above differential, I do believe the patient is suffering from a little component of healthcare associated pneumonia versus worsening congestive heart failure. Does not have an elevated troponin. He does not have any white count or fever however the patient does have a acute on chronic hypoxia with new infiltrate. Based on presentation do feel the patient will require admission for IV fluids and IV antibiotics.  Have discussed with the patient and available family all diagnostics and treatments performed thus far and all questions were answered to the best of my ability. The patient demonstrates understanding and agreement with plan.       ____________________________________________   FINAL CLINICAL IMPRESSION(S) / ED DIAGNOSES  Final diagnoses:  Acute on chronic respiratory failure with hypoxia (HCC)  Dyspnea on exertion      NEW MEDICATIONS  STARTED DURING THIS VISIT:  New Prescriptions   No medications on file     Note:  This document was prepared using Dragon voice recognition software and may include unintentional dictation errors.    Merlyn Lot, MD 10/16/16 2056

## 2016-10-16 NOTE — H&P (Signed)
[facility] [place] NAME: Calvin Good    MR#:  [id number]  DATE OF BIRTH:  September 25, 1940  DATE OF ADMISSION:  10/16/2016  PRIMARY CARE PHYSICIAN: [name], MD   REQUESTING/REFERRING PHYSICIAN: Quentin Cornwall  CHIEF COMPLAINT:   Chief Complaint  Patient presents with  . Shortness of Breath    Pt. here via EMS from home.    HISTORY OF PRESENT ILLNESS: Calvin Good  is a 76 y.o. male with a known history of Prostate cancer, CHF, chronic kidney disease, COPD, diabetes, chronic, oxygen use 2 L, hyperlipidemia, hypertension, pleural effusion after recent pneumonia and required blood And admission to rehabilitation. Last pleural tap was 6 days ago- which was ordered as outpatient by nephrologist because of his worsening shortness of breath. For last few days patient again started getting shortness of breath, some dry cough, no fever or chills. No chest pain. And his oxygen requirement has went up to 3-4 L to maintain his saturation instead of 2 L at his baseline. He went for his hemodialysis today as routine but still have significant swelling on his legs and continued to feel short of breath so his daughter decided to bring him to emergency room.  PAST MEDICAL HISTORY:   Past Medical History:  Diagnosis Date  . Cancer [facility])    Prostate  . CHF (congestive heart failure) ([place])   . Chronic kidney disease   . Complication of anesthesia    hard to wake up  . COPD (chronic obstructive pulmonary disease) ([place])   . Diabetes mellitus without complication ([place])   . Difficult intubation   . Dyspnea   . GERD (gastroesophageal reflux disease)   . Hyperlipidemia   . Hypertension   . Pleural effusion     PAST SURGICAL HISTORY: Past Surgical History:  Procedure Laterality Date  . APPENDECTOMY    . AV FISTULA PLACEMENT Left 05/30/2016   Procedure: ARTERIOVENOUS (AV) FISTULA CREATION ( BRACHIOCEPHALIC );  Surgeon: [name], MD;  Location: [facility] ORS;   Service: Vascular;  Laterality: Left;  . CAPD INSERTION N/A 05/30/2016   Procedure: LAPAROSCOPIC INSERTION CONTINUOUS AMBULATORY PERITONEAL DIALYSIS  (CAPD) CATHETER;  Surgeon: [name], MD;  Location: [facility] ORS;  Service: Vascular;  Laterality: N/A;  . DIALYSIS/PERMA CATHETER INSERTION    . DIALYSIS/PERMA CATHETER REMOVAL N/A 08/21/2016   Procedure: Dialysis/Perma Catheter Removal;  Surgeon: [name], MD;  Location: [place] CV LAB;  Service: Cardiovascular;  Laterality: N/A;  . JOINT REPLACEMENT     right knee   . LEFT HEART CATH AND CORONARY ANGIOGRAPHY N/A 08/21/2016   Procedure: Left Heart Cath and Coronary Angiography possible PCI;  Surgeon: [name], MD;  Location: [place] [facility] LAB;  Service: Cardiovascular;  Laterality: N/A;  . postate removal     . PROSTATE SURGERY    . PSEUDOANERYSM COMPRESSION Right 08/27/2016   Procedure: Pseudoanerysm Compression;  Surgeon: Katha Cabal, MD;  Location: [place] [facility] LAB;  Service: Cardiovascular;  Laterality: Right;  . REMOVAL OF A DIALYSIS CATHETER N/A 08/08/2016   Procedure: REMOVAL OF A DIALYSIS CATHETER;  Surgeon: [name], MD;  Location: [facility] ORS;  Service: Vascular;  Laterality: N/A;    SOCIAL HISTORY:  Social History  Substance Use Topics  . Smoking status: Former Smoker    Quit date: 05/21/1982  . Smokeless tobacco: Never Used  . Alcohol use 1.8 oz/week    3 Glasses of wine per week  FAMILY HISTORY:  Family History  Problem Relation Age of Onset  . Colon cancer Father   . Brain cancer Mother   . CAD Neg Hx   . Diabetes Neg Hx     DRUG ALLERGIES:  Allergies  Allergen Reactions  . Tetracyclines & Related Other (See Comments)    Reaction: unknown happened a long time ago and pt doesn't remember reaction   . Morphine Other (See Comments)    Reaction: confusion    REVIEW OF SYSTEMS:   CONSTITUTIONAL: No fever, fatigue or weakness.  EYES: No blurred or double vision.  EARS, NOSE, AND  THROAT: No tinnitus or ear pain.  RESPIRATORY: Positive for cough, shortness of breath, no wheezing or hemoptysis.  CARDIOVASCULAR: No chest pain, orthopnea, edema.  GASTROINTESTINAL: No nausea, vomiting, diarrhea or abdominal pain.  GENITOURINARY: No dysuria, hematuria.  ENDOCRINE: No polyuria, nocturia,  HEMATOLOGY: No anemia, easy bruising or bleeding SKIN: No rash or lesion. MUSCULOSKELETAL: No joint pain or arthritis.   NEUROLOGIC: No tingling, numbness, weakness.  PSYCHIATRY: No anxiety or depression.   MEDICATIONS AT HOME:  Prior to Admission medications   Medication Sig Start Date End Date Taking? Authorizing Provider  amLODipine (NORVASC) 5 MG tablet Take 5 mg by mouth at bedtime.    Yes [provider]  aspirin 81 MG chewable tablet Chew 81 mg by mouth daily. 8 am   Yes [provider]  atorvastatin (LIPITOR) 40 MG tablet Take 40 mg by mouth daily. 8 pm   Yes [provider]  calcium acetate (PHOSLO) 667 MG capsule Take 1,334 mg by mouth 3 (three) times daily with meals. 2 caps at 8 am, 12 pm, 5 pm   Yes [provider]  carvedilol (COREG) 6.25 MG tablet Take 6.25 mg by mouth 2 (two) times daily. 8 am, 5 pm 09/05/16  Yes [provider]  doxazosin (CARDURA) 8 MG tablet Take 8 mg by mouth daily.    Yes [provider]  furosemide (LASIX) 40 MG tablet Take 40 mg by mouth daily. 8 am   Yes [provider]  insulin aspart (NOVOLOG) 100 UNIT/ML injection Inject 0-9 Units into the skin 3 (three) times daily with meals. CBG < 70: implement hypoglycemia protocol CBG 70 - 120: 0 units CBG 121 - 150: 1 unit CBG 151 - 200: 2 units CBG 201 - 250: 3 units CBG 251 - 300: 5 units CBG 301 - 350: 7 units CBG 351 - 400: 9 units CBG > 400: call MD and obtain STAT lab verification Patient taking differently: Inject 10 Units into the skin 3 (three) times daily with meals. CBG < 70: implement hypoglycemia protocol CBG 70 - 120: 0  units CBG 121 - 150: 1 unit CBG 151 - 200: 2 units CBG 201 - 250: 3 units CBG 251 - 300: 5 units CBG 301 - 350: 7 units CBG 351 - 400: 9 units CBG > 400: call MD and obtain STAT lab verification 09/23/16  Yes Gouru, Aruna, MD  insulin NPH Human (HUMULIN N,NOVOLIN N) 100 UNIT/ML injection Inject 65 Units into the skin 2 (two) times daily before a meal.   Yes [provider]  isosorbide mononitrate (IMDUR) 30 MG 24 hr tablet Take 1 tablet (30 mg total) by mouth daily. 08/22/16  Yes Dustin Flock, MD  lisinopril (PRINIVIL,ZESTRIL) 20 MG tablet Take 20 mg by mouth daily. 8 am   Yes [provider]  omeprazole (PRILOSEC) 20 MG capsule Take 20 mg by  mouth daily.    Yes [provider]  spironolactone (ALDACTONE) 25 MG tablet Take 25 mg by mouth daily. 8 am   Yes [provider]  acetaminophen (TYLENOL) 325 MG tablet Take 2 tablets (650 mg total) by mouth every 6 (six) hours as needed for mild pain (or Fever >/= 101). 09/23/16   Gouru, Illene Silver, MD  dextrose (GLUTOSE) 40 % GEL Take 1 Tube by mouth every 30 (thirty) minutes as needed for low blood sugar. Or until BS > 70 and pt is still responsive.    [provider]  glucagon (GLUCAGON EMERGENCY) 1 MG injection Inject 1 mg into the muscle once as needed. If not responding to oral glucose, snack. May repeat x 1 after 15 minutes. Use only if pt is or is becoming unresponsive as needed.    [provider]  guaiFENesin-dextromethorphan (ROBITUSSIN DM) 100-10 MG/5ML syrup Take 5 mLs by mouth every 4 (four) hours as needed for cough. Patient not taking: Reported on 10/16/2016 09/23/16   Nicholes Mango, MD  insulin detemir (LEVEMIR) 100 UNIT/ML injection Inject 0.3 mLs (30 Units total) into the skin at bedtime. 09/23/16   Gouru, Illene Silver, MD  ipratropium-albuterol (DUONEB) 0.5-2.5 (3) MG/3ML SOLN Take 3 mLs by nebulization every 6 (six) hours as needed. 09/23/16   Gouru, Illene Silver, MD  Lidocaine-Prilocaine, Bulk, 2.5-2.5 %  CREA Apply 1 application topically as needed. Apply to fistula site prior to dialysis treatments as needed    [provider]  ondansetron (ZOFRAN) 4 MG tablet Take 1 tablet (4 mg total) by mouth every 6 (six) hours as needed for nausea. 09/23/16   Nicholes Mango, MD  OXYGEN Inhale 2 L into the lungs continuous.    [provider]      PHYSICAL EXAMINATION:   VITAL SIGNS: Blood pressure (!) 181/60, pulse 85, temperature 98.5 F (36.9 C), temperature source Oral, resp. rate (!) 22, height 5\' 9"  (1.753 m), weight 104.3 kg (230 lb), SpO2 95 %.  GENERAL:  76 y.o.-year-old patient lying in the bed with no acute distress.  EYES: Pupils equal, round, reactive to light and accommodation. No scleral icterus. Extraocular muscles intact.  HEENT: Head atraumatic, normocephalic. Oropharynx and nasopharynx clear.  NECK:  Supple, no jugular venous distention. No thyroid enlargement, no tenderness.  LUNGS: Normal breath sounds bilaterally, no wheezing, Some crepitation. No use of accessory muscles of respiration.  CARDIOVASCULAR: S1, S2 normal. No murmurs, rubs, or gallops.  ABDOMEN: Soft, nontender, nondistended. Bowel sounds present. No organomegaly or mass.  EXTREMITIES: b/l pedal edema,no cyanosis, or clubbing.  NEUROLOGIC: Cranial nerves II through XII are intact. Muscle strength 3-4 /5 in all extremities. Sensation intact. Gait not checked.  PSYCHIATRIC: The patient is alert and oriented x 3.  SKIN: No obvious rash, lesion, or ulcer.   LABORATORY PANEL:   CBC  Recent Labs Lab 10/16/16 1942  WBC 6.6  HGB 9.4*  HCT 27.4*  PLT 152  MCV 87.0  MCH 29.7  MCHC 34.2  RDW 14.4  LYMPHSABS 1.3  MONOABS 0.5  EOSABS 0.3  BASOSABS 0.0   ------------------------------------------------------------------------------------------------------------------  Chemistries   Recent Labs Lab 10/16/16 1942  NA 137  K 2.9*  CL 99*  CO2 30  GLUCOSE 157*  BUN 13  CREATININE 2.38*   CALCIUM 8.6*  AST 20  ALT 12*  ALKPHOS 43  BILITOT 0.6   ------------------------------------------------------------------------------------------------------------------ estimated creatinine clearance is 30.4 mL/min (A) (by C-G formula based on SCr of 2.38 mg/dL (H)). ------------------------------------------------------------------------------------------------------------------ No results for  input(s): TSH, T4TOTAL, T3FREE, THYROIDAB in the last 72 hours.  Invalid input(s): FREET3   Coagulation profile No results for input(s): INR, PROTIME in the last 168 hours. ------------------------------------------------------------------------------------------------------------------- No results for input(s): DDIMER in the last 72 hours. -------------------------------------------------------------------------------------------------------------------  Cardiac Enzymes  Recent Labs Lab 10/16/16 1942  TROPONINI <0.03   ------------------------------------------------------------------------------------------------------------------ Invalid input(s): POCBNP  ---------------------------------------------------------------------------------------------------------------  Urinalysis No results found for: COLORURINE, APPEARANCEUR, LABSPEC, PHURINE, GLUCOSEU, HGBUR, BILIRUBINUR, KETONESUR, PROTEINUR, UROBILINOGEN, NITRITE, LEUKOCYTESUR   RADIOLOGY: Dg Chest Portable 1 View  Result Date: 10/16/2016 CLINICAL DATA:  Shortness of Breath EXAM: PORTABLE CHEST 1 VIEW COMPARISON:  October 10, 2016 FINDINGS: There is patchy consolidation in the right base with small right pleural effusion. There is slight left base atelectasis. Lungs elsewhere clear. Heart is enlarged with pulmonary vascularity within normal limits. There is aortic atherosclerosis. No evident adenopathy. No bone lesions. IMPRESSION: Patchy consolidation right base, likely pneumonia. Small right pleural effusion. Left lung clear  except for rather minimal left base atelectasis. Stable cardiomegaly. There is aortic atherosclerosis. Aortic Atherosclerosis (ICD10-I70.0). Electronically Signed   By: Lowella Grip III M.D.   On: 10/16/2016 20:21    EKG: Orders placed or performed during the hospital encounter of 10/16/16  . ED EKG  . ED EKG  . EKG 12-Lead  . EKG 12-Lead    IMPRESSION AND PLAN:  * Acute on chronic respiratory failure   Likely secondary to healthcare associated pneumonia   Chest x-ray shows some new finding suspicious of pneumonia.   I will start him on broad-spectrum antibiotic to cover for healthcare associated infections as he has recurrent admissions.   Also call pulmonary consult due to recurrent pneumonia and effusions.  * Fluid overload   He has leg edema in spite of getting hemodialysis today.   Nephrology consult to decide about requirement for dialysis.  * End-stage renal disease on hemodialysis   nephrology consult.  * Diabetes   We will decrease the dose of his basal insulin and keep on sliding scale coverage.  * Hypertension   Continue home medications.  * COPD   Continue DuoNeb, currently no wheezing.  All the records are reviewed and case discussed with ED provider. Management plans discussed with the patient, family and they are in agreement.  CODE STATUS: Full code. Code Status History    Date Active Date Inactive Code Status Order ID Comments User Context   09/14/2016  6:31 PM 09/24/2016  2:43 AM Full Code [id number]  Nicholes Mango, MD Inpatient   08/26/2016  6:45 AM 08/28/2016  8:11 PM Full Code [id number]  Saundra Shelling, MD Inpatient   08/20/2016  3:05 AM 08/22/2016  1:01 PM Full Code [id number]  Lance Coon, MD ED     I discussed the plan with patient's daughter and son-in-law who is power of attorney in the room.  TOTAL TIME TAKING CARE OF THIS PATIENT: 50 minutes.    Vaughan Basta M.D on 10/16/2016   Between 7am to 6pm - Pager - [phone]  After  6pm go to [email or web address] - password [email or web address] [facility]  Office  [phone]  CC: Primary care physician; [name], MD   Note: This dictation was prepared with Dragon dictation along with smaller phrase technology. Any transcriptional errors that result from this process are unintentional.

## 2016-10-17 ENCOUNTER — Encounter: Payer: Self-pay | Admitting: Radiology

## 2016-10-17 ENCOUNTER — Inpatient Hospital Stay: Payer: Medicare Other

## 2016-10-17 DIAGNOSIS — J9621 Acute and chronic respiratory failure with hypoxia: Secondary | ICD-10-CM

## 2016-10-17 DIAGNOSIS — J189 Pneumonia, unspecified organism: Secondary | ICD-10-CM

## 2016-10-17 DIAGNOSIS — J811 Chronic pulmonary edema: Secondary | ICD-10-CM

## 2016-10-17 DIAGNOSIS — J90 Pleural effusion, not elsewhere classified: Secondary | ICD-10-CM

## 2016-10-17 DIAGNOSIS — R590 Localized enlarged lymph nodes: Secondary | ICD-10-CM

## 2016-10-17 LAB — BASIC METABOLIC PANEL
Anion gap: 8 (ref 5–15)
BUN: 17 mg/dL (ref 6–20)
CALCIUM: 8.9 mg/dL (ref 8.9–10.3)
CO2: 31 mmol/L (ref 22–32)
Chloride: 99 mmol/L — ABNORMAL LOW (ref 101–111)
Creatinine, Ser: 2.84 mg/dL — ABNORMAL HIGH (ref 0.61–1.24)
GFR, EST AFRICAN AMERICAN: 23 mL/min — AB (ref >60)
GFR, EST NON AFRICAN AMERICAN: 20 mL/min — AB (ref >60)
Glucose, Bld: 182 mg/dL — ABNORMAL HIGH (ref 65–99)
Potassium: 3.3 mmol/L — ABNORMAL LOW (ref 3.5–5.1)
Sodium: 138 mmol/L (ref 135–145)

## 2016-10-17 LAB — MRSA PCR SCREENING: MRSA by PCR: NEGATIVE

## 2016-10-17 LAB — LACTIC ACID, PLASMA: Lactic Acid, Venous: 1.1 mmol/L (ref 0.5–1.9)

## 2016-10-17 LAB — CBC
HCT: 30.4 % — ABNORMAL LOW (ref 40.0–52.0)
Hemoglobin: 10.3 g/dL — ABNORMAL LOW (ref 13.0–18.0)
MCH: 29.4 pg (ref 26.0–34.0)
MCHC: 34.1 g/dL (ref 32.0–36.0)
MCV: 86.4 fL (ref 80.0–100.0)
PLATELETS: 168 10*3/uL (ref 150–440)
RBC: 3.51 MIL/uL — AB (ref 4.40–5.90)
RDW: 14.2 % (ref 11.5–14.5)
WBC: 7.1 10*3/uL (ref 3.8–10.6)

## 2016-10-17 LAB — GLUCOSE, CAPILLARY
GLUCOSE-CAPILLARY: 155 mg/dL — AB (ref 65–99)
GLUCOSE-CAPILLARY: 167 mg/dL — AB (ref 65–99)
GLUCOSE-CAPILLARY: 147 mg/dL — AB (ref 65–99)
Glucose-Capillary: 191 mg/dL — ABNORMAL HIGH (ref 65–99)
Glucose-Capillary: 195 mg/dL — ABNORMAL HIGH (ref 65–99)

## 2016-10-17 MED ORDER — INSULIN NPH (HUMAN) (ISOPHANE) 100 UNIT/ML ~~LOC~~ SUSP
30.0000 [IU] | Freq: Two times a day (BID) | SUBCUTANEOUS | Status: DC
  Filled 2016-10-17: qty 10

## 2016-10-17 MED ORDER — FUROSEMIDE 10 MG/ML IJ SOLN
INTRAMUSCULAR | Status: AC
  Filled 2016-10-17: qty 4

## 2016-10-17 MED ORDER — VANCOMYCIN HCL IN DEXTROSE 1-5 GM/200ML-% IV SOLN
1000.0000 mg | INTRAVENOUS | Status: DC
  Filled 2016-10-17: qty 200

## 2016-10-17 MED ORDER — DEXTROSE 5 % IV SOLN
1.0000 g | INTRAVENOUS | Status: DC
  Filled 2016-10-17: qty 1

## 2016-10-17 MED ORDER — IOPAMIDOL (ISOVUE-370) INJECTION 76%
75.0000 mL | Freq: Once | INTRAVENOUS | Status: AC | PRN
  Administered 2016-10-17: 75 mL via INTRAVENOUS

## 2016-10-17 MED ORDER — INSULIN NPH (HUMAN) (ISOPHANE) 100 UNIT/ML ~~LOC~~ SUSP
30.0000 [IU] | Freq: Two times a day (BID) | SUBCUTANEOUS | Status: DC
  Administered 2016-10-17: 30 [IU] via SUBCUTANEOUS
  Filled 2016-10-17 (×2): qty 10

## 2016-10-17 MED ORDER — INSULIN DETEMIR 100 UNIT/ML ~~LOC~~ SOLN
30.0000 [IU] | Freq: Two times a day (BID) | SUBCUTANEOUS | Status: DC
  Administered 2016-10-18 – 2016-10-19 (×3): 30 [IU] via SUBCUTANEOUS
  Filled 2016-10-17 (×8): qty 0.3

## 2016-10-17 MED ORDER — VANCOMYCIN HCL 10 G IV SOLR
1250.0000 mg | INTRAVENOUS | Status: DC
  Administered 2016-10-17: 1250 mg via INTRAVENOUS
  Filled 2016-10-17: qty 1250

## 2016-10-17 MED ORDER — POTASSIUM CHLORIDE CRYS ER 20 MEQ PO TBCR
40.0000 meq | EXTENDED_RELEASE_TABLET | Freq: Once | ORAL | Status: AC
  Administered 2016-10-17: 40 meq via ORAL
  Filled 2016-10-17: qty 2

## 2016-10-17 MED ORDER — ORAL CARE MOUTH RINSE
15.0000 mL | Freq: Two times a day (BID) | OROMUCOSAL | Status: DC
  Administered 2016-10-17 – 2016-10-20 (×5): 15 mL via OROMUCOSAL

## 2016-10-17 MED ORDER — HYDRALAZINE HCL 20 MG/ML IJ SOLN
5.0000 mg | Freq: Once | INTRAMUSCULAR | Status: AC
Start: 2016-10-17 — End: 2016-10-17
  Administered 2016-10-17: 5 mg via INTRAVENOUS
  Filled 2016-10-17: qty 1

## 2016-10-17 MED ORDER — DEXTROSE 5 % IV SOLN
2.0000 g | INTRAVENOUS | Status: DC
  Filled 2016-10-17: qty 2

## 2016-10-17 MED ORDER — FUROSEMIDE 10 MG/ML IJ SOLN
20.0000 mg | INTRAMUSCULAR | Status: AC
  Administered 2016-10-17: 20 mg via INTRAVENOUS

## 2016-10-17 MED ORDER — FUROSEMIDE 10 MG/ML IJ SOLN
80.0000 mg | Freq: Once | INTRAMUSCULAR | Status: AC
  Administered 2016-10-17: 80 mg via INTRAVENOUS
  Filled 2016-10-17: qty 8

## 2016-10-17 NOTE — Progress Notes (Signed)
MD notified of pt c/o increasing SOB, ORders for IV lasix and stat cxr given. O2 sats 98% on 4L [place]. Will give and continue to monitor.

## 2016-10-17 NOTE — Consult Note (Signed)
PULMONARY CONSULT NOTE  Requesting MD/Service: Verdell Carmine Date of consultation: 07/19 Reason for consultation: Hypoxemic respiratory failure, bilateral pleural effusions  PT PROFILE: 76 y.o. male with ESRD admitted 07/18 with worsening dyspnea and hypoxemia  HPI:  As above. Patient was admitted with a similar illness one month ago and was briefly in the ICU/SDU, seen by PCCM. He underwent thoracentesis 06/16 with fluid analysis consistent with transudate. He now notes worsening DOE and even rest dyspnea. He denies fever, cough, chest pain. He does note orthopnea and increasing lower extremity edema.   Past Medical History:  Diagnosis Date  . Cancer [facility])    Prostate  . CHF (congestive heart failure) ([place])   . Chronic kidney disease   . Complication of anesthesia    hard to wake up  . COPD (chronic obstructive pulmonary disease) ([place])   . Diabetes mellitus without complication ([place])   . Difficult intubation   . Dyspnea   . GERD (gastroesophageal reflux disease)   . Hyperlipidemia   . Hypertension   . Pleural effusion   . Renal insufficiency     Past Surgical History:  Procedure Laterality Date  . APPENDECTOMY    . AV FISTULA PLACEMENT Left 05/30/2016   Procedure: ARTERIOVENOUS (AV) FISTULA CREATION ( BRACHIOCEPHALIC );  Surgeon: [name], MD;  Location: [facility] ORS;  Service: Vascular;  Laterality: Left;  . CAPD INSERTION N/A 05/30/2016   Procedure: LAPAROSCOPIC INSERTION CONTINUOUS AMBULATORY PERITONEAL DIALYSIS  (CAPD) CATHETER;  Surgeon: [name], MD;  Location: [facility] ORS;  Service: Vascular;  Laterality: N/A;  . DIALYSIS/PERMA CATHETER INSERTION    . DIALYSIS/PERMA CATHETER REMOVAL N/A 08/21/2016   Procedure: Dialysis/Perma Catheter Removal;  Surgeon: [name], MD;  Location: [place] [facility];  Service: Cardiovascular;  Laterality: N/A;  . JOINT REPLACEMENT     right knee   . LEFT HEART CATH AND CORONARY ANGIOGRAPHY N/A 08/21/2016   Procedure: Left Heart Cath and  Coronary Angiography possible PCI;  Surgeon: [name], MD;  Location: [place] [facility];  Service: Cardiovascular;  Laterality: N/A;  . postate removal     . PROSTATE SURGERY    . PSEUDOANERYSM COMPRESSION Right 08/27/2016   Procedure: Pseudoanerysm Compression;  Surgeon: Katha Cabal, MD;  Location: [place] [facility];  Service: Cardiovascular;  Laterality: Right;  . REMOVAL OF A DIALYSIS CATHETER N/A 08/08/2016   Procedure: REMOVAL OF A DIALYSIS CATHETER;  Surgeon: [name], MD;  Location: [facility] ORS;  Service: Vascular;  Laterality: N/A;    MEDICATIONS: I have reviewed all medications and confirmed regimen as documented  Social History   Social History  . Marital status: Widowed    Spouse name: N/A  . Number of children: N/A  . Years of education: N/A   Occupational History  . retired    Social History Main Topics  . Smoking status: Former Smoker    Quit date: 05/21/1982  . Smokeless tobacco: Never Used  . Alcohol use 1.8 oz/week    3 Glasses of wine per week  . Drug use: No  . Sexual activity: No   Other Topics Concern  . Not on file   Social History Narrative  . No narrative on file    Family History  Problem Relation Age of Onset  . Colon cancer Father   . Brain cancer Mother   . CAD Neg Hx   . Diabetes Neg Hx     ROS: No fever, myalgias/arthralgias, unexplained weight loss or  weight gain No new focal weakness or sensory deficits No otalgia, hearing loss, visual changes, nasal and sinus symptoms, mouth and throat problems No neck pain or adenopathy No abdominal pain, N/V/D, diarrhea, change in bowel pattern No dysuria, change in urinary pattern   Vitals:   10/17/16 0547 10/17/16 0742 10/17/16 0858 10/17/16 1214  BP: (!) 149/46   (!) 142/68  Pulse: 75  80 79  Resp:    16  Temp:    (!) 97.5 F (36.4 C)  TempSrc:    Oral  SpO2:  100% 97% 92%  Weight:      Height:         EXAM:  Gen: WDWN, Mildly dyspneic with minimal  exertion HEENT: NCAT, sclera white, oropharynx normal Neck: Supple without LAN, thyromegaly, JVP cannot be visualized Lungs: Dull to percussion approximately one third to half up bilaterally. Bronchial breath sounds in both bases. Left basilar crackles. No wheezes Cardiovascular: Reg, no murmurs noted Abdomen: Soft, nontender, normal BS Ext: 2-3+ symmetric pretibial ankle and pedal edema Neuro: CNs grossly intact, motor and sensory intact Skin: Limited exam, no lesions noted  DATA:   BMP Latest Ref Rng & Units 10/17/2016 10/16/2016 09/21/2016  Glucose 65 - 99 mg/dL 182(H) 157(H) 171(H)  BUN 6 - 20 mg/dL 17 13 42(H)  Creatinine 0.61 - 1.24 mg/dL 2.84(H) 2.38(H) 5.32(H)  Sodium 135 - 145 mmol/L 138 137 135  Potassium 3.5 - 5.1 mmol/L 3.3(L) 2.9(L) 3.5  Chloride 101 - 111 mmol/L 99(L) 99(L) 95(L)  CO2 22 - 32 mmol/L 31 30 30   Calcium 8.9 - 10.3 mg/dL 8.9 8.6(L) 8.3(L)    CBC Latest Ref Rng & Units 10/17/2016 10/16/2016 09/21/2016  WBC 3.8 - 10.6 K/uL 7.1 6.6 7.9  Hemoglobin 13.0 - 18.0 g/dL 10.3(L) 9.4(L) 10.3(L)  Hematocrit 40.0 - 52.0 % 30.4(L) 27.4(L) 29.5(L)  Platelets 150 - 440 K/uL 168 152 145(L)    CXR (07/19):  Pulmonary edema pattern with right pleural effusion CT chest (07/19): Right greater than left pleural effusions, mild pulmonary edema, nonspecific hilar and mediastinal lymphadenopathy  IMPRESSION:   1) Acute on chronic hypoxemic respiratory failure 2) bilateral pulmonary infiltrates and R > L pleural effusions - radiographic pattern is most suggestive of heart failure/pulmonary edema 3) nonspecific hilar and mediastinal lymphadenopathy - previous CT scan performed in June was noncontrasted and therefore, it is difficult to discern whether this has progressed. To the best that I can tell, it seems to be stable. 4) I do not see evidence of an acute pulmonary infection  PLAN:  1) discussed with Dr. Holley Raring and Verdell Carmine  2) suggest more aggressive volume removal with  dialysis and reassessment of his "dry weight" 3) recheck chest x-ray in 1-2 weeks to ensure improvement 4) if chest x-ray does not clear with more aggressive dialysis, refer back to pulmonary medicine for further consideration of possible etiologies of his pulmonary infiltrates, pleural effusions, mediastinal adenopathy    Merton Border, MD PCCM service Mobile [phone] Pager [phone] 10/17/2016 6:06 PM

## 2016-10-17 NOTE — Progress Notes (Addendum)
Date: 10/17/2016,   MRN# [id number] Margaret Staggs [birth year] Code Status:     Code Status Orders        Start     Ordered   10/16/16 2342  Full code  Continuous     10/16/16 2341    Code Status History    Date Active Date Inactive Code Status Order ID Comments User Context   09/14/2016  6:31 PM 09/24/2016  2:43 AM Full Code [id number]  Nicholes Mango, MD Inpatient   08/26/2016  6:45 AM 08/28/2016  8:11 PM Full Code [id number]  Saundra Shelling, MD Inpatient   08/20/2016  3:05 AM 08/22/2016  1:01 PM Full Code [id number]  Lance Coon, MD ED    Advance Directive Documentation     Most Recent Value  Type of Advance Directive  Living will, Healthcare Power of Attorney  Pre-existing out of facility DNR order (yellow form or pink MOST form)  -  "MOST" Form in Place?  -     Hosp day:@LENGTHOFSTAYDAYS @ Referring MD: @ATDPROV @      CC: Recurrent pleural effusion  HPI: This is a 76 yr old male, patient of Dr. Ouida Sills. on dialysis, hx of prostate cancer,  S/p right pleural effusion x 2 in the last 4-5 weeks  prior to this admission. Initial thoracentesis is consistent with a transudate. Here with worsening sob, weak. On w/u had recurrent right pleural effusion, ? Pneumonia. Pulmonary consulted regarding course for further work up.  LV EF: 60% -   65%  ------------------------------------------------------------------- Indications:      R07.9 Chest Pain.  ------------------------------------------------------------------- History:   PMH:  Acquired from the patient and from the patient&'s chart.  PMH:  CHF. Chronic Kidney Disease-Dialysis. COPD. Dyspnea. Risk factors:  Former tobacco use. Hypertension. Diabetes mellitus. Dyslipidemia.  ------------------------------------------------------------------- Study Conclusions  - Left ventricle: The cavity size was normal. Wall thickness was   increased in a pattern of mild LVH. There was mild concentric   hypertrophy. Systolic function was normal.  The estimated ejection   fraction was in the range of 60% to 65%. Wall motion was normal;   there were no regional wall motion abnormalities.  Impressions:  - Normal Overall LVF   Normal Wall Motion   EF=60%   normal right side   basically normal echo. Normal study. No cardiac source of emboli   was indentified.  PMHX:   Past Medical History:  Diagnosis Date  . Cancer [facility])    Prostate  . CHF (congestive heart failure) ([place])   . Chronic kidney disease   . Complication of anesthesia    hard to wake up  . COPD (chronic obstructive pulmonary disease) ([place])   . Diabetes mellitus without complication ([place])   . Difficult intubation   . Dyspnea   . GERD (gastroesophageal reflux disease)   . Hyperlipidemia   . Hypertension   . Pleural effusion   . Renal insufficiency    Surgical Hx:  Past Surgical History:  Procedure Laterality Date  . APPENDECTOMY    . AV FISTULA PLACEMENT Left 05/30/2016   Procedure: ARTERIOVENOUS (AV) FISTULA CREATION ( BRACHIOCEPHALIC );  Surgeon: [name], MD;  Location: [facility] ORS;  Service: Vascular;  Laterality: Left;  . CAPD INSERTION N/A 05/30/2016   Procedure: LAPAROSCOPIC INSERTION CONTINUOUS AMBULATORY PERITONEAL DIALYSIS  (CAPD) CATHETER;  Surgeon: [name], MD;  Location: [facility] ORS;  Service: Vascular;  Laterality: N/A;  . DIALYSIS/PERMA CATHETER INSERTION    . DIALYSIS/PERMA CATHETER REMOVAL N/A 08/21/2016   Procedure:  Dialysis/Perma Catheter Removal;  Surgeon: [name], MD;  Location: [place] CV LAB;  Service: Cardiovascular;  Laterality: N/A;  . JOINT REPLACEMENT     right knee   . LEFT HEART CATH AND CORONARY ANGIOGRAPHY N/A 08/21/2016   Procedure: Left Heart Cath and Coronary Angiography possible PCI;  Surgeon: [name], MD;  Location: [place] CV LAB;  Service: Cardiovascular;  Laterality: N/A;  . postate removal     . PROSTATE SURGERY    . PSEUDOANERYSM COMPRESSION Right 08/27/2016   Procedure: Pseudoanerysm  Compression;  Surgeon: Katha Cabal, MD;  Location: [place] CV LAB;  Service: Cardiovascular;  Laterality: Right;  . REMOVAL OF A DIALYSIS CATHETER N/A 08/08/2016   Procedure: REMOVAL OF A DIALYSIS CATHETER;  Surgeon: [name], MD;  Location: [facility] ORS;  Service: Vascular;  Laterality: N/A;   Family Hx:  Family History  Problem Relation Age of Onset  . Colon cancer Father   . Brain cancer Mother   . CAD Neg Hx   . Diabetes Neg Hx    Social Hx:   Social History  Substance Use Topics  . Smoking status: Former Smoker    Quit date: 05/21/1982  . Smokeless tobacco: Never Used  . Alcohol use 1.8 oz/week    3 Glasses of wine per week   Medication:    Home Medication:    Current Medication: @CURMEDTAB @   Allergies:  Tetracyclines & related and Morphine  Review of Systems: Gen:  Denies  fever, sweats, chills HEENT: Denies blurred vision, double vision, ear pain, eye pain, hearing loss, nose bleeds, sore throat Cvc:  No dizziness, chest pain or heaviness Resp: less sob   Gi: Denies swallowing difficulty, stomach pain, nausea or vomiting, diarrhea, constipation, bowel incontinence Gu:  Denies bladder incontinence, burning urine Ext:   No Joint pain, stiffness or swelling Skin: No skin rash, easy bruising or bleeding or hives Endoc:  No polyuria, polydipsia , polyphagia or weight change Psych: No depression, insomnia or hallucinations  Other:  All other systems negative  Physical Examination:   VS: BP (!) 142/68 (BP Location: Right Arm)   Pulse 79   Temp (!) 97.5 F (36.4 C) (Oral)   Resp 16   Ht 5\' 9"  (1.753 m)   Wt 226 lb 14.4 oz (102.9 kg)   SpO2 92%   BMI 33.51 kg/m   General Appearance: No distress  Neuro/pscych without focal findings, mental status, speech normal, alert and oriented, cranial nerves 2-12 intact, reflexes normal and symmetric, sensation grossly normal  HEENT: PERRLA, EOM intact, no ptosis, no other lesions noticedi: Pulmonary:.No  wheezing, No rales  Basal dullnes r > l:   Cardiovascular:  Normal S1,S2.  No m/r/g.  Abdomen:Benign, Soft, non-tender, No masses, hepatosplenomegaly, No lymphadenopathy Endoc: No evident thyromegaly, no signs of acromegaly or Cushing features Skin:   warm, no rashes, no ecchymosis  Extremities: normal, no cyanosis, clubbing, no edema, warm with normal capillary refill.   Labs results:   Recent Labs     10/16/16  1942  10/17/16  0006  HGB  9.4*  10.3*  HCT  27.4*  30.4*  MCV  87.0  86.4  WBC  6.6  7.1  BUN  13  17  CREATININE  2.38*  2.84*  GLUCOSE  157*  182*  CALCIUM  8.6*  8.9  ,      Rad results:   Ct Angio Chest Pe W Or Wo Contrast  Result Date: 10/17/2016  CLINICAL DATA:  Acute onset of shortness of breath that began earlier today. Current history of end-stage renal disease on hemodialysis, chronic diastolic CHF, hypertension and COPD. EXAM: CT ANGIOGRAPHY CHEST WITH CONTRAST TECHNIQUE: Multidetector CT imaging of the chest was performed using the standard protocol during bolus administration of intravenous contrast. Multiplanar CT image reconstructions and MIPs were obtained to evaluate the vascular anatomy. CONTRAST:  75 mL Isovue 370 IV. COMPARISON:  Unenhanced CT chest 09/14/2016. FINDINGS: Cardiovascular: Contrast opacification of the pulmonary arteries is good. No filling defects within either main pulmonary artery or their segmental branches in either lung to suggest pulmonary embolism. Cardiac silhouette moderately enlarged. Left ventricular hypertrophy. Moderate aortic valvular calcification. Severe three-vessel coronary atherosclerosis. No pericardial effusion. Prominent epicardial fat. Severe atherosclerosis involving the thoracic and upper abdominal aorta without evidence of aneurysm. Mediastinum/Nodes: Mediastinal and bilateral hilar lymphadenopathy, the largest nodal mass in the subcarinal region measuring approximately 3.7 x 5.1 cm (series 4, image 52). Conglomerate  right hilar lymph nodes measure in total approximately 4.4 x 1.8 cm (series 4, image 47). Conglomerate of left lower paratracheal lymph nodes measure approximately 4.4 x 2.7 cm (series 4, image 36). This lymphadenopathy is unchanged from the prior CT. No axillary lymphadenopathy. Scattered subcentimeter nodules involving the atrophic thyroid gland without a dominant nodule. Lungs/Pleura: Emphysematous changes throughout both lungs. Mild to moderate diffuse interstitial pulmonary edema. Large right pleural effusion and moderate-sized left pleural effusion with associated passive atelectasis in the lower lobes, right greater than left. No confluent airspace consolidation elsewhere. Central airways patent. Upper Abdomen: Atrophic pancreas with an approximate 1 cm cystic lesion involving the tail, with enhancement in the surrounding tissue that is greater than the enhancement of the remainder of the pancreas. Calcified gallstones within the contracted gallbladder, the largest stone measuring approximately 1.3 cm. Simple cyst arising from the visualized upper poles of both kidneys. Musculoskeletal: Degenerative changes throughout the thoracic spine. Exaggeration of the usual thoracic kyphosis. Osseous demineralization. No acute abnormalities. Review of the MIP images confirms the above findings. IMPRESSION: 1. No evidence pulmonary embolism. 2. CHF with moderate interstitial pulmonary edema. 3. Large left pleural effusion and moderate-sized right pleural effusion with associated passive atelectasis involving the lower lobes bilaterally, right greater than left. 4. COPD/emphysema. 5. Cardiomegaly.  Three-vessel coronary artery atherosclerosis. 6. Thoracic and upper abdominal aortic atherosclerosis without evidence aneurysm. 7. 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 However, as there is enhancement in the tissue surrounding the  cystic lesion which is greater than that of the remainder of the pancreas, an early malignancy is not excluded. Abdominal CT without and with contrast (pancreatic protocol CT) is recommended in further evaluation when the patient's acute illness has improved. 8. Cholelithiasis. Aortic Atherosclerosis (ICD10-I70.0) and Emphysema (ICD10-J43.9). Electronically Signed   By: Evangeline Dakin M.D.   On: 10/17/2016 16:22   Dg Chest Port 1 View  Result Date: 10/17/2016 CLINICAL DATA:  Increasing dyspnea EXAM: PORTABLE CHEST 1 VIEW COMPARISON:  10/16/2016 FINDINGS: Stable cardiomegaly with aortic atherosclerosis. Interval increase in interstitial edema and small right pleural effusion. Superimposed pneumonia at the right lung base is not entirely excluded given its slightly more confluent appearance. Some of this however may be due to the pleural effusion. No acute nor suspicious osseous abnormality. IMPRESSION: 1. Interval increase in interstitial edema and small right effusion. 2. Stable cardiomegaly with aortic atherosclerosis. 3. Confluent appearance the right lung base may represent a small pneumonia or  superimposed pleural fluid as initially suspected on prior exam. Electronically Signed   By: Ashley Royalty M.D.   On: 10/17/2016 02:43   Dg Chest Portable 1 View  Result Date: 10/16/2016 CLINICAL DATA:  Shortness of Breath EXAM: PORTABLE CHEST 1 VIEW COMPARISON:  October 10, 2016 FINDINGS: There is patchy consolidation in the right base with small right pleural effusion. There is slight left base atelectasis. Lungs elsewhere clear. Heart is enlarged with pulmonary vascularity within normal limits. There is aortic atherosclerosis. No evident adenopathy. No bone lesions. IMPRESSION: Patchy consolidation right base, likely pneumonia. Small right pleural effusion. Left lung clear except for rather minimal left base atelectasis. Stable cardiomegaly. There is aortic atherosclerosis. Aortic Atherosclerosis (ICD10-I70.0).  Electronically Signed   By: Lowella Grip III M.D.   On: 10/16/2016 20:21      Assessment and Plan: Bilateral pleural effusion, larger on the right. Transudate effusion. Consistent with fluid over load, ( renal failure, ? Chf, doubt) -if possible increase fluid removal ( via dialysis) tomorrow -watch fluid and salt intake -may have to repeat thoracentesis  Suspect right base pneumonia -agree with present antibiotic coverage  Copd, stable -duo neb   1 cm cystic lesion in the tail of the pancreas, ? Etiology, ddx include mucinous cancer -abd ct scan with and without contrast ordered     I have personally obtained a history, examined the patient, evaluated laboratory and imaging results, formulated the assessment and plan and placed orders.  The Patient requires high complexity decision making for assessment and support, frequent evaluation and titration of therapies, application of advanced monitoring technologies and extensive interpretation of multiple databases.   Jamae Tison,M.D. Board certified in [facility]

## 2016-10-17 NOTE — Progress Notes (Addendum)
Pharmacy note Spoke to pt's daughter, who stated her father takes Humulin N 65 units in AM and 60 units at bedtime. He has been living with her for about a week since leaving [place].  Discussed switching to formulary agent with hospitalist.

## 2016-10-17 NOTE — Progress Notes (Signed)
MD order for Bipap at bedtime. Pt respiratory status is improved. Pt is currently sleeping in bed. WIll continue to monitor.

## 2016-10-17 NOTE — Progress Notes (Signed)
[place] [facility] [place] NAME: Calvin Good    MR#:  [id number]  DATE OF BIRTH:  1941-01-31  SUBJECTIVE:   Patient here due to shortness of breath. Suspected to have pneumonia. Had thoracentesis done a week ago as an outpatient. Denies any hemoptysis, fever, admits to a cough which is nonproductive.  REVIEW OF SYSTEMS:    Review of Systems  Constitutional: Negative for chills and fever.  HENT: Negative for congestion and tinnitus.   Eyes: Negative for blurred vision and double vision.  Respiratory: Positive for cough and shortness of breath. Negative for wheezing.   Cardiovascular: Negative for chest pain, orthopnea and PND.  Gastrointestinal: Negative for abdominal pain, diarrhea, nausea and vomiting.  Genitourinary: Negative for dysuria and hematuria.  Neurological: Positive for weakness. Negative for dizziness, sensory change and focal weakness.  All other systems reviewed and are negative.   Nutrition: Renal w/ fluid restriction.  Tolerating Diet: Yes Tolerating PT: Await Eval.   DRUG ALLERGIES:   Allergies  Allergen Reactions  . Tetracyclines & Related Other (See Comments)    Reaction: unknown happened a long time ago and pt doesn't remember reaction   . Morphine Other (See Comments)    Reaction: confusion    VITALS:  Blood pressure (!) 142/68, pulse 79, temperature (!) 97.5 F (36.4 C), temperature source Oral, resp. rate 16, height 5\' 9"  (1.753 m), weight 102.9 kg (226 lb 14.4 oz), SpO2 92 %.  PHYSICAL EXAMINATION:   Physical Exam  GENERAL:  76 y.o.-year-old patient lying in bed in mild Resp. Distress.  EYES: Pupils equal, round, reactive to light and accommodation. No scleral icterus. Extraocular muscles intact.  HEENT: Head atraumatic, normocephalic. Oropharynx and nasopharynx clear.  NECK:  Supple, no jugular venous distention. No thyroid enlargement, no tenderness.  LUNGS: Prolonged Insp. & exp. phase, no wheezing,  rales, rhonchi. Poor A/E on the left lower lung fields. No use of accessory muscles of respiration.  CARDIOVASCULAR: S1, S2 normal. No murmurs, rubs, or gallops.  ABDOMEN: Soft, nontender, nondistended. Bowel sounds present. No organomegaly or mass.  EXTREMITIES: No cyanosis, clubbing or edema b/l.    NEUROLOGIC: Cranial nerves II through XII are intact. No focal Motor or sensory deficits b/l. Globally weak.    PSYCHIATRIC: The patient is alert and oriented x 3.  SKIN: No obvious rash, lesion, or ulcer.    LABORATORY PANEL:   CBC  Recent Labs Lab 10/17/16 0006  WBC 7.1  HGB 10.3*  HCT 30.4*  PLT 168   ------------------------------------------------------------------------------------------------------------------  Chemistries   Recent Labs Lab 10/16/16 1942 10/17/16 0006  NA 137 138  K 2.9* 3.3*  CL 99* 99*  CO2 30 31  GLUCOSE 157* 182*  BUN 13 17  CREATININE 2.38* 2.84*  CALCIUM 8.6* 8.9  AST 20  --   ALT 12*  --   ALKPHOS 43  --   BILITOT 0.6  --    ------------------------------------------------------------------------------------------------------------------  Cardiac Enzymes  Recent Labs Lab 10/16/16 1942  TROPONINI <0.03   ------------------------------------------------------------------------------------------------------------------  RADIOLOGY:  Dg Chest Port 1 View  Result Date: 10/17/2016 CLINICAL DATA:  Increasing dyspnea EXAM: PORTABLE CHEST 1 VIEW COMPARISON:  10/16/2016 FINDINGS: Stable cardiomegaly with aortic atherosclerosis. Interval increase in interstitial edema and small right pleural effusion. Superimposed pneumonia at the right lung base is not entirely excluded given its slightly more confluent appearance. Some of this however may be due to the pleural effusion. No acute nor suspicious osseous abnormality. IMPRESSION: 1. Interval  increase in interstitial edema and small right effusion. 2. Stable cardiomegaly with aortic  atherosclerosis. 3. Confluent appearance the right lung base may represent a small pneumonia or superimposed pleural fluid as initially suspected on prior exam. Electronically Signed   By: Ashley Royalty M.D.   On: 10/17/2016 02:43   Dg Chest Portable 1 View  Result Date: 10/16/2016 CLINICAL DATA:  Shortness of Breath EXAM: PORTABLE CHEST 1 VIEW COMPARISON:  October 10, 2016 FINDINGS: There is patchy consolidation in the right base with small right pleural effusion. There is slight left base atelectasis. Lungs elsewhere clear. Heart is enlarged with pulmonary vascularity within normal limits. There is aortic atherosclerosis. No evident adenopathy. No bone lesions. IMPRESSION: Patchy consolidation right base, likely pneumonia. Small right pleural effusion. Left lung clear except for rather minimal left base atelectasis. Stable cardiomegaly. There is aortic atherosclerosis. Aortic Atherosclerosis (ICD10-I70.0). Electronically Signed   By: Lowella Grip III M.D.   On: 10/16/2016 20:21     ASSESSMENT AND PLAN:   75 year old male with past medical history of end-stage renal disease on hemodialysis, essential hypertension, hyperlipidemia, GERD, COPD, history of chronic diastolic CHF who presents to the hospital due to shortness of breath.  1. Acute on chronic respiratory failure with hypoxia-etiology suspected be secondary to pneumonia. -Status post recent right-sided thoracentesis with 1.2 L of fluid removed. Chest x-ray on admission suggestive of patchy consolidation in the right lung base. Small right pleural effusion and left lung minimal atelectasis but no effusion. -Continue treatment for HCAP with Vanc, Cefepime.  - await further Pulmonary input. Will get CTA Chest to further look at lungs.   2. History of chronic diastolic CHF-clinically patient does not appear to be in congestive heart failure. -Continue Lasix, Aldactone, carvedilol, lisinopril.  3. End-stage renal disease on  hemodialysis-nephrology has been consulted. Continue dialysis on a scheduled days of Monday Wednesday and Friday.  4. Essential hypertension-continue carvedilol, lisinopril, Imdur.  5. GERD-continue Protonix.  6. Diabetes type 2 without complication-continue NPH, sliding scale insulin.  7. COPD-no acute exacerbation-continue duo nebs.  8. Secondary hyperparathyroidism-continue PhosLo.  Updated Daughter over the phone earlier this a.m.   All the records are reviewed and case discussed with Care Management/Social Worker. Management plans discussed with the patient, family and they are in agreement.  CODE STATUS: Full code  DVT Prophylaxis: Hep. SQ  TOTAL TIME TAKING CARE OF THIS PATIENT: 30 minutes.   POSSIBLE D/C IN 1-2 DAYS, DEPENDING ON CLINICAL CONDITION.   Henreitta Leber M.D on 10/17/2016 at 2:33 PM  Between 7am to 6pm - Pager - [phone]  After 6pm go to [email or web address] - [employer] [facility]  Office  [phone]  CC: Primary care physician; [name], MD

## 2016-10-17 NOTE — Progress Notes (Signed)
[facility]  ROUNDING NOTE   Subjective:  Patient well known to [place] from the last admission. He presents now with progressive shortness of breath. He underwent right-sided thoracentesis last Thursday. Since that point in time his shortness of breath has slowly worsened. Patient did go to hemodialysis yesterday. Possible pneumonia at right base as well.   Objective:  Vital signs in last 24 hours:  Temp:  [97.9 F (36.6 C)-98.5 F (36.9 C)] 98.1 F (36.7 C) (07/19 0431) Pulse Rate:  [75-91] 80 (07/19 0858) Resp:  [16-22] 16 (07/19 0431) BP: (142-194)/(46-89) 149/46 (07/19 0547) SpO2:  [90 %-100 %] 97 % (07/19 0858) FiO2 (%):  [40 %-95 %] 40 % (07/19 0742) Weight:  [102.9 kg (226 lb 14.4 oz)-104.3 kg (230 lb)] 102.9 kg (226 lb 14.4 oz) (07/19 0002)  Weight change:  Filed Weights   10/16/16 1940 10/17/16 0002  Weight: 104.3 kg (230 lb) 102.9 kg (226 lb 14.4 oz)    Intake/Output: I/O last 3 completed shifts: In: 254 [IV Piggyback:254] Out: 225 [Urine:225]   Intake/Output this shift:  No intake/output data recorded.  Physical Exam: General: No acute distress  Head: Normocephalic, atraumatic. Moist oral mucosal membranes  Eyes: Anicteric  Neck: Supple, trachea midline  Lungs:  Diminished breath sounds at the right base, normal effort   Heart: S1S2 no rubs  Abdomen:  Soft, nontender, bowel sounds present  Extremities: Trace peripheral edema.  Neurologic: Awake, alert, following commands  Skin: No lesions  Access: Left upper extremity AV fistula     Basic Metabolic Panel:  Recent Labs Lab 10/16/16 1942 10/17/16 0006  NA 137 138  K 2.9* 3.3*  CL 99* 99*  CO2 30 31  GLUCOSE 157* 182*  BUN 13 17  CREATININE 2.38* 2.84*  CALCIUM 8.6* 8.9    Liver Function Tests:  Recent Labs Lab 10/16/16 1942  AST 20  ALT 12*  ALKPHOS 43  BILITOT 0.6  PROT 6.5  ALBUMIN 3.2*   No results for input(s): LIPASE, AMYLASE in the last 168 hours. No results  for input(s): AMMONIA in the last 168 hours.  CBC:  Recent Labs Lab 10/16/16 1942 10/17/16 0006  WBC 6.6 7.1  NEUTROABS 4.4  --   HGB 9.4* 10.3*  HCT 27.4* 30.4*  MCV 87.0 86.4  PLT 152 168    Cardiac Enzymes:  Recent Labs Lab 10/16/16 1942  TROPONINI <0.03    BNP: Invalid input(s): POCBNP  CBG:  Recent Labs Lab 10/17/16 0751 10/17/16 0946 10/17/16 1138  GLUCAP 155* 191* 195*    Microbiology: Results for orders placed or performed during the hospital encounter of 09/14/16  Blood Culture (routine x 2)     Status: Abnormal   Collection Time: 09/14/16  2:53 PM  Result Value Ref Range Status   Specimen Description BLOOD RIGHT [facility]  Final   Special Requests   Final    BOTTLES DRAWN AEROBIC AND ANAEROBIC Blood Culture adequate volume   Culture  Setup Time   Final    GRAM POSITIVE COCCI AEROBIC BOTTLE ONLY CRITICAL RESULT CALLED TO, READ BACK BY AND VERIFIED WITH: Sheema Hallaji @ 0800 09/15/16 by [facility]    Culture (A)  Final    STAPHYLOCOCCUS SPECIES (COAGULASE NEGATIVE) THE SIGNIFICANCE OF ISOLATING THIS ORGANISM FROM A SINGLE SET OF BLOOD CULTURES WHEN MULTIPLE SETS ARE DRAWN IS UNCERTAIN. PLEASE NOTIFY THE MICROBIOLOGY DEPARTMENT WITHIN ONE WEEK IF SPECIATION AND SENSITIVITIES ARE REQUIRED. Performed at [facility], [place],  [place] [address]    Report Status 09/17/2016 FINAL  Final  Blood Culture ID Panel (Reflexed)     Status: Abnormal   Collection Time: 09/14/16  2:53 PM  Result Value Ref Range Status   Enterococcus species (A) NOT DETECTED Final    THIS TEST WAS ORDERED IN ERROR AND HAS BEEN CREDITED.    Comment: INCORRECT PANEL SET UP. SPECIMEN AGE LIMIT EXCEEDED WHEN ERROR WAS DISCOVERED. CALLED TO CHRISTINE KATSOUDAS 09/16/16 1100. SGD   Listeria monocytogenes TEST WILL BE CREDITED (A) NOT DETECTED Final   Staphylococcus species TEST WILL BE CREDITED (A) NOT DETECTED Final   Staphylococcus aureus TEST WILL BE CREDITED (A) NOT  DETECTED Final   Streptococcus species TEST WILL BE CREDITED (A) NOT DETECTED Final   Streptococcus agalactiae TEST WILL BE CREDITED (A) NOT DETECTED Final   Streptococcus pneumoniae TEST WILL BE CREDITED (A) NOT DETECTED Final   Streptococcus pyogenes TEST WILL BE CREDITED (A) NOT DETECTED Final   Acinetobacter baumannii TEST WILL BE CREDITED (A) NOT DETECTED Final   Enterobacteriaceae species TEST WILL BE CREDITED (A) NOT DETECTED Final   Enterobacter cloacae complex TEST WILL BE CREDITED (A) NOT DETECTED Final   Escherichia coli TEST WILL BE CREDITED (A) NOT DETECTED Final   Klebsiella oxytoca TEST WILL BE CREDITED (A) NOT DETECTED Final   Klebsiella pneumoniae TEST WILL BE CREDITED (A) NOT DETECTED Final   Proteus species TEST WILL BE CREDITED (A) NOT DETECTED Final   Serratia marcescens TEST WILL BE CREDITED (A) NOT DETECTED Final   Haemophilus influenzae TEST WILL BE CREDITED (A) NOT DETECTED Final   Neisseria meningitidis TEST WILL BE CREDITED (A) NOT DETECTED Final   Pseudomonas aeruginosa TEST WILL BE CREDITED (A) NOT DETECTED Final   Candida albicans TEST WILL BE CREDITED (A) NOT DETECTED Final   Candida glabrata TEST WILL BE CREDITED (A) NOT DETECTED Final   Candida krusei TEST WILL BE CREDITED (A) NOT DETECTED Final   Candida parapsilosis TEST WILL BE CREDITED (A) NOT DETECTED Final   Candida tropicalis TEST WILL BE CREDITED (A) NOT DETECTED Final  Blood Culture (routine x 2)     Status: Abnormal   Collection Time: 09/14/16  2:58 PM  Result Value Ref Range Status   Specimen Description BLOOD BLOOD LEFT HAND  Final   Special Requests   Final    BOTTLES DRAWN AEROBIC AND ANAEROBIC Blood Culture adequate volume   Culture  Setup Time   Final    GRAM POSITIVE RODS AEROBIC BOTTLE ONLY CRITICAL RESULT CALLED TO, READ BACK BY AND VERIFIED WITH: MATT MCBANE AT 6237 ON 09/15/16 RWW CONFIRMED BY [facility]    Culture (A)  Final    BACILLUS SPECIES Standardized susceptibility testing for  this organism is not available. Performed at [facility], [place] [address], [place], [place] [address]    Report Status 09/17/2016 FINAL  Final  Blood Culture ID Panel (Reflexed)     Status: None   Collection Time: 09/14/16  2:58 PM  Result Value Ref Range Status   Enterococcus species NOT DETECTED NOT DETECTED Final   Listeria monocytogenes NOT DETECTED NOT DETECTED Final   Staphylococcus species NOT DETECTED NOT DETECTED Final   Staphylococcus aureus NOT DETECTED NOT DETECTED Final   Streptococcus species NOT DETECTED NOT DETECTED Final   Streptococcus agalactiae NOT DETECTED NOT DETECTED Final   Streptococcus pneumoniae NOT DETECTED NOT DETECTED Final   Streptococcus pyogenes NOT DETECTED NOT DETECTED Final   Acinetobacter baumannii NOT DETECTED  NOT DETECTED Final   Enterobacteriaceae species NOT DETECTED NOT DETECTED Final   Enterobacter cloacae complex NOT DETECTED NOT DETECTED Final   Escherichia coli NOT DETECTED NOT DETECTED Final   Klebsiella oxytoca NOT DETECTED NOT DETECTED Final   Klebsiella pneumoniae NOT DETECTED NOT DETECTED Final   Proteus species NOT DETECTED NOT DETECTED Final   Serratia marcescens NOT DETECTED NOT DETECTED Final   Haemophilus influenzae NOT DETECTED NOT DETECTED Final   Neisseria meningitidis NOT DETECTED NOT DETECTED Final   Pseudomonas aeruginosa NOT DETECTED NOT DETECTED Final   Candida albicans NOT DETECTED NOT DETECTED Final   Candida glabrata NOT DETECTED NOT DETECTED Final   Candida krusei NOT DETECTED NOT DETECTED Final   Candida parapsilosis NOT DETECTED NOT DETECTED Final   Candida tropicalis NOT DETECTED NOT DETECTED Final  Body fluid culture (includes gram stain)     Status: None   Collection Time: 09/14/16  3:47 PM  Result Value Ref Range Status   Specimen Description PLEURAL  Final   Special Requests PLEURAL  Final   Gram Stain   Final    RARE WBC PRESENT, PREDOMINANTLY MONONUCLEAR NO ORGANISMS SEEN    Culture   Final     NO GROWTH 3 DAYS Performed at [facility], [address] [place], [place] [address]    Report Status 09/18/2016 FINAL  Final  Culture, sputum-assessment     Status: None   Collection Time: 09/14/16  6:32 PM  Result Value Ref Range Status   Specimen Description SPUTUM  Final   Special Requests Immunocompromised  Final   Sputum evaluation THIS SPECIMEN IS ACCEPTABLE FOR SPUTUM CULTURE  Final   Report Status 09/14/2016 FINAL  Final  Culture, respiratory (NON-Expectorated)     Status: None   Collection Time: 09/14/16  6:32 PM  Result Value Ref Range Status   Specimen Description SPUTUM  Final   Special Requests Immunocompromised Reflexed from [id number]  Final   Gram Stain   Final    FEW WBC PRESENT,BOTH PMN AND MONONUCLEAR FEW GRAM NEGATIVE RODS FEW GRAM NEGATIVE COCCI IN PAIRS    Culture   Final    Consistent with normal respiratory flora. Performed at [facility], [place] [address] [place], [place] [address]    Report Status 09/17/2016 FINAL  Final  MRSA PCR Screening     Status: Abnormal   Collection Time: 09/15/16  8:30 PM  Result Value Ref Range Status   MRSA by PCR POSITIVE (A) NEGATIVE Corrected    Comment: MARSHA HATCH  AT 2155 ON 09/15/16 RWW        The GeneXpert MRSA Assay (FDA approved for NASAL specimens only), is one component of a comprehensive MRSA colonization surveillance program. It is not intended to diagnose MRSA infection nor to guide or monitor treatment for MRSA infections. CORRECTED ON 06/18 AT 0544: PREVIOUSLY REPORTED AS POSITIVE        The GeneXpert MRSA Assay (FDA approved for NASAL specimens only), is one component of a comprehensive MRSA colonization surveillance program. It is not intended to diagnose MRSA infection  nor to guide or monitor treatment for MRSA infections. RESULT CALLED TO, READ BACK BY AND VERIFIED WITH: MARSHAATCH AT 2155 ON 09/15/16 RWW     Coagulation Studies: No results for input(s): LABPROT, INR in the last 72  hours.  Urinalysis: No results for input(s): COLORURINE, LABSPEC, PHURINE, GLUCOSEU, HGBUR, BILIRUBINUR, KETONESUR, PROTEINUR, UROBILINOGEN, NITRITE, LEUKOCYTESUR in the last 72 hours.  Invalid input(s): APPERANCEUR  Imaging: Dg Chest Port 1 View  Result Date: 10/17/2016 CLINICAL DATA:  Increasing dyspnea EXAM: PORTABLE CHEST 1 VIEW COMPARISON:  10/16/2016 FINDINGS: Stable cardiomegaly with aortic atherosclerosis. Interval increase in interstitial edema and small right pleural effusion. Superimposed pneumonia at the right lung base is not entirely excluded given its slightly more confluent appearance. Some of this however may be due to the pleural effusion. No acute nor suspicious osseous abnormality. IMPRESSION: 1. Interval increase in interstitial edema and small right effusion. 2. Stable cardiomegaly with aortic atherosclerosis. 3. Confluent appearance the right lung base may represent a small pneumonia or superimposed pleural fluid as initially suspected on prior exam. Electronically Signed   By: Ashley Royalty M.D.   On: 10/17/2016 02:43   Dg Chest Portable 1 View  Result Date: 10/16/2016 CLINICAL DATA:  Shortness of Breath EXAM: PORTABLE CHEST 1 VIEW COMPARISON:  October 10, 2016 FINDINGS: There is patchy consolidation in the right base with small right pleural effusion. There is slight left base atelectasis. Lungs elsewhere clear. Heart is enlarged with pulmonary vascularity within normal limits. There is aortic atherosclerosis. No evident adenopathy. No bone lesions. IMPRESSION: Patchy consolidation right base, likely pneumonia. Small right pleural effusion. Left lung clear except for rather minimal left base atelectasis. Stable cardiomegaly. There is aortic atherosclerosis. Aortic Atherosclerosis (ICD10-I70.0). Electronically Signed   By: Lowella Grip III M.D.   On: 10/16/2016 20:21     Medications:   . ceFEPime (MAXIPIME) IV    . [START ON 10/18/2016] vancomycin     . aspirin  81 mg  Oral Daily  . atorvastatin  40 mg Oral Daily  . calcium acetate  1,334 mg Oral TID WC  . carvedilol  6.25 mg Oral BID  . doxazosin  8 mg Oral Daily  . furosemide      . furosemide  40 mg Oral Daily  . heparin  5,000 Units Subcutaneous Q8H  . insulin aspart  0-9 Units Subcutaneous TID WC  . insulin NPH Human  30 Units Subcutaneous BID AC & HS  . ipratropium-albuterol  3 mL Nebulization Q4H  . isosorbide mononitrate  30 mg Oral Daily  . lisinopril  20 mg Oral Daily  . mouth rinse  15 mL Mouth Rinse BID  . pantoprazole  40 mg Oral Daily  . spironolactone  25 mg Oral Daily   acetaminophen, docusate sodium, ondansetron  Assessment/ Plan:  76 y.o. caucasian male with diabetes mellitus type II insulin dependent, hypertension, coronary artery disease, hyperlipidemia, prostate cancer status post prostatectomy, right total knee, appendectomy.   MWF [facility] [place] [facility]  1. End Stage Renal Disease MWF: -Patient had hemodialysis yesterday. We will plan for next also treatment tomorrow with ultrafiltration target between 2-2.5 kg.  2.  Anemia of chronic kidney disease: Hemoglobin 10.3. Consider starting Epogen tomorrow.  3.  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  - Management as per hospitalist.  4. Secondary Hyperparathyroidism:   - Check phosphorus with next dialysis treatment.  Otherwise continue calcium acetate 2 tablets by mouth 3 times a day with meals.  5. Acute respiratory failure:  Recurrent in nature. Still having significant shortness of breath. We will plan for ultrafiltration target of 2-2.5 kg. However pneumonia pleural effusion also playing some role. Patient on broad-spectrum anabolic therapy at this time.   LOS: 1 Colleen Donahoe 7/19/201812:13 PM

## 2016-10-17 NOTE — Care Management (Signed)
Patient admitted with acute on chronic respiratory failure.  Patient chronic HD patient.  [facility] Good HD liaison notified of admission.  Patient has chronic O2 through [facility].  Open with [facility] home health.  Sarah with [facility] notified of admission.  Patient pending home pallative consult. Patient recently discharged from [facility].  PT consult pending.  RNCM following.

## 2016-10-17 NOTE — Evaluation (Signed)
Physical Therapy Evaluation Patient Details Name: Calvin Good MRN: [id number] DOB: 10-28-1940 Today's Date: 10/17/2016   History of Present Illness  76yo white male admitted with HCAP and sepsis, after having 1.5 L fluid removed in thoracentesis.  SOB, on HD and still inadequately diuresed for symptoms, anemic.  PMHx:  CHF, respiratory failure chronically, COPD, O2 at baseline, HD, ESRD, R TKA, incontinence, DM, hyperparathyroidism, metabolic encephalopathy.  Clinical Impression  Pt was seen for evaluation of mobility with ck of his O2 sats during the effort.  HR was only increased minimally from 72 to 81, O2 sat was 92% baseline, then down to 82 % with extended gait up to 70'.  He is SOB and minimally exerted and feels like the PNA is really restricting him.  Will follow acutely to work on strength and control of vitals with effort, and progress to HHPT and then later cardiac rehab for follow up ex if able to arrange transport.  Case manager to talk with him and set up the tx.    Follow Up Recommendations Home health PT;Outpatient PT;Other (comment) (HHPT then referral to cardiac rehab for follow up)    Equipment Recommendations  None recommended by PT    Recommendations for Other Services       Precautions / Restrictions Precautions Precautions: Fall (telemetry, ck O2 sats) Restrictions Weight Bearing Restrictions: No      Mobility  Bed Mobility Overal bed mobility: Needs Assistance Bed Mobility: Supine to Sit;Sit to Supine     Supine to sit: Min assist Sit to supine: Min assist   General bed mobility comments: minor trunk support to sit up and then pt could get to bed with minimal help of LE's  Transfers Overall transfer level: Needs assistance Equipment used: 1 person hand held assist Transfers: Sit to/from Stand Sit to Stand: Min guard         General transfer comment: reminders for safety and monitoring of lines  Ambulation/Gait Ambulation/Gait assistance: Min  guard Ambulation Distance (Feet): 70 Feet Assistive device: 1 person hand held assist Gait Pattern/deviations: Step-through pattern;Decreased stride length;Wide base of support;Shuffle Gait velocity: reduced Gait velocity interpretation: Below normal speed for age/gender General Gait Details: able to navigate with help for O2 line but O2 sats dropped with effort  Stairs            Wheelchair Mobility    Modified Rankin (Stroke Patients Only)       Balance Overall balance assessment: Needs assistance Sitting-balance support: Feet supported Sitting balance-Leahy Scale: Good     Standing balance support: Single extremity supported Standing balance-Leahy Scale: Fair                               Pertinent Vitals/Pain Pain Assessment: No/denies pain    Home Living Family/patient expects to be discharged to:: Private residence Living Arrangements: Children Available Help at Discharge: Family;Available 24 hours/day Type of Home: House Home Access: Stairs to enter Entrance Stairs-Rails: Chemical engineer of Steps: 4 Home Layout: Two level;Full bath on main level;Able to live on main level with bedroom/bathroom Home Equipment: Kasandra Knudsen - single point;Shower seat;Grab bars - tub/shower;Grab bars - toilet      Prior Function                 Hand Dominance   Dominant Hand: Right    Extremity/Trunk Assessment   Upper Extremity Assessment Upper Extremity Assessment: Overall WFL for tasks assessed  Lower Extremity Assessment Lower Extremity Assessment: Overall WFL for tasks assessed (x hips 4+)    Cervical / Trunk Assessment Cervical / Trunk Assessment: Normal  Communication   Communication: No difficulties  Cognition Arousal/Alertness: Awake/alert Behavior During Therapy: WFL for tasks assessed/performed Overall Cognitive Status: Within Functional Limits for tasks assessed                                         General Comments      Exercises     Assessment/Plan    PT Assessment Patient needs continued PT services  PT Problem List Decreased strength;Decreased range of motion;Decreased activity tolerance;Decreased balance;Decreased mobility;Decreased coordination;Decreased safety awareness;Cardiopulmonary status limiting activity;Obesity;Decreased skin integrity       PT Treatment Interventions DME instruction;Gait training;Stair training;Functional mobility training;Therapeutic exercise;Therapeutic activities;Balance training;Neuromuscular re-education;Patient/family education    PT Goals (Current goals can be found in the Care Plan section)  Acute Rehab PT Goals Patient Stated Goal: to get his breathing in better shape, get stronger PT Goal Formulation: With patient Time For Goal Achievement: 10/31/16 Potential to Achieve Goals: Good    Frequency Min 2X/week   Barriers to discharge   has help at home    Co-evaluation               AM-PAC PT "6 Clicks" Daily Activity  Outcome Measure Difficulty turning over in bed (including adjusting bedclothes, sheets and blankets)?: Total Difficulty moving from lying on back to sitting on the side of the bed? : Total Difficulty sitting down on and standing up from a chair with arms (e.g., wheelchair, bedside commode, etc,.)?: Total Help needed moving to and from a bed to chair (including a wheelchair)?: A Little Help needed walking in hospital room?: A Little Help needed climbing 3-5 steps with a railing? : A Little 6 Click Score: 12    End of Session Equipment Utilized During Treatment: Gait belt;Oxygen Activity Tolerance: Patient tolerated treatment well;Treatment limited secondary to medical complications (Comment) (dropped to 82% with effort on 4.5 L O2) Patient left: in bed;with call bell/phone within reach;with bed alarm set Nurse Communication: Mobility status PT Visit Diagnosis: Unsteadiness on feet (R26.81);Muscle  weakness (generalized) (M62.81)    Time: 8416-6063 PT Time Calculation (min) (ACUTE ONLY): 35 min   Charges:   PT Evaluation $PT Eval Moderate Complexity: 1 Procedure PT Treatments $Gait Training: 8-22 mins   PT G Codes:   PT G-Codes **NOT FOR INPATIENT CLASS** Functional Assessment Tool Used: AM-PAC 6 Clicks Basic Mobility    Ramond Dial 10/17/2016, 6:18 PM   Mee Hives, PT MS Acute Rehab Dept. Number: [place] [facility] [place]

## 2016-10-17 NOTE — Progress Notes (Signed)
Please note patient has a PENDING HOME Palliative consult. He was re hospitalized prior to being seen. May benefit from in [place] consult. CMRN [place] [place] made aware. Thank you. Flo Shanks RN, BSN, [facility] [place], hospital Liaison [phone] c

## 2016-10-17 NOTE — Progress Notes (Signed)
Pharmacy Antibiotic Note  Calvin Good is a 76 y.o. male admitted on 10/16/2016 with sepsis.  Pharmacy has been consulted for vanc/cefepime dosing.  Plan: Patient received vanc 1g and cefepime 2g IV x 1 in ED  Will f/u w/ vanc 1.25g IV q24h w/ 6 hour stack dose. Will get a vanc trough 07/22 @ 0400 prior to 4th dose. Ke 0.0296 T1/2 24 hours Goal trough 15 - 20 mcg/mL  Will start cefepime 2g IV daily   Height: 5\' 9"  (175.3 cm) Weight: 226 lb 14.4 oz (102.9 kg) IBW/kg (Calculated) : 70.7  Temp (24hrs), Avg:98.2 F (36.8 C), Min:97.9 F (36.6 C), Max:98.5 F (36.9 C)   Recent Labs Lab 10/16/16 1942 10/16/16 2053  WBC 6.6  --   CREATININE 2.38*  --   LATICACIDVEN  --  1.0    Estimated Creatinine Clearance: 30.2 mL/min (A) (by C-G formula based on SCr of 2.38 mg/dL (H)).    Allergies  Allergen Reactions  . Tetracyclines & Related Other (See Comments)    Reaction: unknown happened a long time ago and pt doesn't remember reaction   . Morphine Other (See Comments)    Reaction: confusion     Thank you for allowing pharmacy to be a part of this patient's care.  Tobie Lords, PharmD, BCPS Clinical Pharmacist 10/17/2016

## 2016-10-17 NOTE — Progress Notes (Signed)
Pt was placed on Bipap due to increased WOB.

## 2016-10-17 NOTE — Progress Notes (Signed)
Pharmacy Antibiotic Note  Calvin Good is a 76 y.o. male admitted on 10/16/2016 with sepsis.  Pharmacy has been consulted for vanc/cefepime dosing. Pt noted to be HD pt, usual HD schedule MWF per previous admission notes.  Patient received vanc 1g and cefepime 2g IV x 1 in ED   Plan: Pt received vancomycin 1000 mg IV x1 7/18 at 2316 and 1250 mg IV 7/19 at 0547 (for scheduled q24h regimen).  Pt noted to be HD pt, usual schedule MWF. Will convert to HD dosing.  Will continue dosing with Vancomycin 1000 mg IV with each HD session (to be given in last hour of HD) scheduled for MWF. Trough before 3rd HD session on 7/25. Goal trough 15-25 mcg/ml pre-HD. No nephrologist note yet this admission. Will need to follow up HD schedule and charting of doses prior to ordered trough.   Will continue dosing with cefepime 1 g IV q24h   Height: 5\' 9"  (175.3 cm) Weight: 226 lb 14.4 oz (102.9 kg) IBW/kg (Calculated) : 70.7  Temp (24hrs), Avg:98.2 F (36.8 C), Min:97.9 F (36.6 C), Max:98.5 F (36.9 C)   Recent Labs Lab 10/16/16 1942 10/16/16 2053 10/17/16 0006  WBC 6.6  --  7.1  CREATININE 2.38*  --  2.84*  LATICACIDVEN  --  1.0 1.1    Estimated Creatinine Clearance: 25.3 mL/min (A) (by C-G formula based on SCr of 2.84 mg/dL (H)).    Allergies  Allergen Reactions  . Tetracyclines & Related Other (See Comments)    Reaction: unknown happened a long time ago and pt doesn't remember reaction   . Morphine Other (See Comments)    Reaction: confusion   Vancomycin/cefepime 7/18 >>  UCx sent 7/19   Thank you for allowing pharmacy to be a part of this patient's care.  Rayna Sexton, PharmD, BCPS Clinical Pharmacist 10/17/2016 10:40 AM

## 2016-10-18 ENCOUNTER — Inpatient Hospital Stay: Payer: Medicare Other

## 2016-10-18 LAB — GLUCOSE, CAPILLARY
Glucose-Capillary: 143 mg/dL — ABNORMAL HIGH (ref 65–99)
Glucose-Capillary: 189 mg/dL — ABNORMAL HIGH (ref 65–99)
Glucose-Capillary: 122 mg/dL — ABNORMAL HIGH (ref 65–99)
Glucose-Capillary: 140 mg/dL — ABNORMAL HIGH (ref 65–99)

## 2016-10-18 LAB — URINE CULTURE: Culture: <10000 — AB

## 2016-10-18 LAB — PHOSPHORUS: PHOSPHORUS: 2.3 mg/dL — AB (ref 2.5–4.6)

## 2016-10-18 NOTE — Procedures (Signed)
PROCEDURE SUMMARY:  Successful US guided right thoracentesis. Yielded 1.5 L of clear yellow fluid. Pt tolerated procedure well. No immediate complications.  Specimen was not sent for labs. CXR ordered.  Ascencion Dike PA-C 10/18/2016 2:29 PM

## 2016-10-18 NOTE — Progress Notes (Signed)
[facility]  ROUNDING NOTE   Subjective:  Patient due for hemodialysis today. Pulmonary medicine has evaluated the patient. They have requested that we increase ultrafiltration.   Objective:  Vital signs in last 24 hours:  Temp:  [97.5 F (36.4 C)-97.7 F (36.5 C)] 97.7 F (36.5 C) (07/20 0446) Pulse Rate:  [79-101] 79 (07/20 0453) Resp:  [16-20] 20 (07/20 0446) BP: (142-174)/(50-68) 152/50 (07/20 0446) SpO2:  [81 %-97 %] 94 % (07/20 0721) FiO2 (%):  [40 %] 40 % (07/19 2342)  Weight change:  Filed Weights   10/16/16 1940 10/17/16 0002  Weight: 104.3 kg (230 lb) 102.9 kg (226 lb 14.4 oz)    Intake/Output: I/O last 3 completed shifts: In: 60 [IV Piggyback:254] Out: 84 [Urine:675]   Intake/Output this shift:  Total I/O In: 240 [P.O.:240] Out: -   Physical Exam: General: No acute distress  Head: Normocephalic, atraumatic. Moist oral mucosal membranes  Eyes: Anicteric  Neck: Supple, trachea midline  Lungs:  Diminished breath sounds at the right base, normal effort   Heart: S1S2 no rubs  Abdomen:  Soft, nontender, bowel sounds present  Extremities: 1+ peripheral edema.  Neurologic: Awake, alert, following commands  Skin: No lesions  Access: Left upper extremity AV fistula     Basic Metabolic Panel:  Recent Labs Lab 10/16/16 1942 10/17/16 0006  NA 137 138  K 2.9* 3.3*  CL 99* 99*  CO2 30 31  GLUCOSE 157* 182*  BUN 13 17  CREATININE 2.38* 2.84*  CALCIUM 8.6* 8.9    Liver Function Tests:  Recent Labs Lab 10/16/16 1942  AST 20  ALT 12*  ALKPHOS 43  BILITOT 0.6  PROT 6.5  ALBUMIN 3.2*   No results for input(s): LIPASE, AMYLASE in the last 168 hours. No results for input(s): AMMONIA in the last 168 hours.  CBC:  Recent Labs Lab 10/16/16 1942 10/17/16 0006  WBC 6.6 7.1  NEUTROABS 4.4  --   HGB 9.4* 10.3*  HCT 27.4* 30.4*  MCV 87.0 86.4  PLT 152 168    Cardiac Enzymes:  Recent Labs Lab 10/16/16 1942  TROPONINI  <0.03    BNP: Invalid input(s): POCBNP  CBG:  Recent Labs Lab 10/17/16 0946 10/17/16 1138 10/17/16 1707 10/17/16 2114 10/18/16 0743  GLUCAP 191* 195* 167* 147* 143*    Microbiology: Results for orders placed or performed during the hospital encounter of 10/16/16  Urine culture     Status: Abnormal   Collection Time: 10/17/16  6:43 AM  Result Value Ref Range Status   Specimen Description URINE, RANDOM  Final   Special Requests NONE  Final   Culture (A)  Final    <10,000 COLONIES/mL INSIGNIFICANT GROWTH Performed at [facility], [place] [address], [place], [place] [address]    Report Status 10/18/2016 FINAL  Final  MRSA PCR Screening     Status: None   Collection Time: 10/17/16  6:36 PM  Result Value Ref Range Status   MRSA by PCR NEGATIVE NEGATIVE Final    Comment:        The GeneXpert MRSA Assay (FDA approved for NASAL specimens only), is one component of a comprehensive MRSA colonization surveillance program. It is not intended to diagnose MRSA infection nor to guide or monitor treatment for MRSA infections.     Coagulation Studies: No results for input(s): LABPROT, INR in the last 72 hours.  Urinalysis: No results for input(s): COLORURINE, LABSPEC, PHURINE, GLUCOSEU, HGBUR, BILIRUBINUR, KETONESUR, PROTEINUR, UROBILINOGEN, NITRITE, LEUKOCYTESUR in  the last 72 hours.  Invalid input(s): APPERANCEUR    Imaging: Ct Angio Chest Pe W Or Wo Contrast  Result Date: 10/17/2016 CLINICAL DATA:  Acute onset of shortness of breath that began earlier today. Current history of end-stage renal disease on hemodialysis, chronic diastolic CHF, hypertension and COPD. EXAM: CT ANGIOGRAPHY CHEST WITH CONTRAST TECHNIQUE: Multidetector CT imaging of the chest was performed using the standard protocol during bolus administration of intravenous contrast. Multiplanar CT image reconstructions and MIPs were obtained to evaluate the vascular anatomy. CONTRAST:  75 mL Isovue 370 IV.  COMPARISON:  Unenhanced CT chest 09/14/2016. FINDINGS: Cardiovascular: Contrast opacification of the pulmonary arteries is good. No filling defects within either main pulmonary artery or their segmental branches in either lung to suggest pulmonary embolism. Cardiac silhouette moderately enlarged. Left ventricular hypertrophy. Moderate aortic valvular calcification. Severe three-vessel coronary atherosclerosis. No pericardial effusion. Prominent epicardial fat. Severe atherosclerosis involving the thoracic and upper abdominal aorta without evidence of aneurysm. Mediastinum/Nodes: Mediastinal and bilateral hilar lymphadenopathy, the largest nodal mass in the subcarinal region measuring approximately 3.7 x 5.1 cm (series 4, image 52). Conglomerate right hilar lymph nodes measure in total approximately 4.4 x 1.8 cm (series 4, image 47). Conglomerate of left lower paratracheal lymph nodes measure approximately 4.4 x 2.7 cm (series 4, image 36). This lymphadenopathy is unchanged from the prior CT. No axillary lymphadenopathy. Scattered subcentimeter nodules involving the atrophic thyroid gland without a dominant nodule. Lungs/Pleura: Emphysematous changes throughout both lungs. Mild to moderate diffuse interstitial pulmonary edema. Large right pleural effusion and moderate-sized left pleural effusion with associated passive atelectasis in the lower lobes, right greater than left. No confluent airspace consolidation elsewhere. Central airways patent. Upper Abdomen: Atrophic pancreas with an approximate 1 cm cystic lesion involving the tail, with enhancement in the surrounding tissue that is greater than the enhancement of the remainder of the pancreas. Calcified gallstones within the contracted gallbladder, the largest stone measuring approximately 1.3 cm. Simple cyst arising from the visualized upper poles of both kidneys. Musculoskeletal: Degenerative changes throughout the thoracic spine. Exaggeration of the usual  thoracic kyphosis. Osseous demineralization. No acute abnormalities. Review of the MIP images confirms the above findings. IMPRESSION: 1. No evidence pulmonary embolism. 2. CHF with moderate interstitial pulmonary edema. 3. Large left pleural effusion and moderate-sized right pleural effusion with associated passive atelectasis involving the lower lobes bilaterally, right greater than left. 4. COPD/emphysema. 5. Cardiomegaly.  Three-vessel coronary artery atherosclerosis. 6. Thoracic and upper abdominal aortic atherosclerosis without evidence aneurysm. 7. 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 However, as there is enhancement in the tissue surrounding the cystic lesion which is greater than that of the remainder of the pancreas, an early malignancy is not excluded. Abdominal CT without and with contrast (pancreatic protocol CT) is recommended in further evaluation when the patient's acute illness has improved. 8. Cholelithiasis. Aortic Atherosclerosis (ICD10-I70.0) and Emphysema (ICD10-J43.9). Electronically Signed   By: Evangeline Dakin M.D.   On: 10/17/2016 16:22   Dg Chest Port 1 View  Result Date: 10/17/2016 CLINICAL DATA:  Increasing dyspnea EXAM: PORTABLE CHEST 1 VIEW COMPARISON:  10/16/2016 FINDINGS: Stable cardiomegaly with aortic atherosclerosis. Interval increase in interstitial edema and small right pleural effusion. Superimposed pneumonia at the right lung base is not entirely excluded given its slightly more confluent appearance. Some of this however may be due to the pleural effusion. No acute nor suspicious osseous abnormality. IMPRESSION: 1. Interval increase in interstitial  edema and small right effusion. 2. Stable cardiomegaly with aortic atherosclerosis. 3. Confluent appearance the right lung base may represent a small pneumonia or superimposed pleural fluid as initially suspected on prior exam.  Electronically Signed   By: Ashley Royalty M.D.   On: 10/17/2016 02:43   Dg Chest Portable 1 View  Result Date: 10/16/2016 CLINICAL DATA:  Shortness of Breath EXAM: PORTABLE CHEST 1 VIEW COMPARISON:  October 10, 2016 FINDINGS: There is patchy consolidation in the right base with small right pleural effusion. There is slight left base atelectasis. Lungs elsewhere clear. Heart is enlarged with pulmonary vascularity within normal limits. There is aortic atherosclerosis. No evident adenopathy. No bone lesions. IMPRESSION: Patchy consolidation right base, likely pneumonia. Small right pleural effusion. Left lung clear except for rather minimal left base atelectasis. Stable cardiomegaly. There is aortic atherosclerosis. Aortic Atherosclerosis (ICD10-I70.0). Electronically Signed   By: Lowella Grip III M.D.   On: 10/16/2016 20:21     Medications:    . aspirin  81 mg Oral Daily  . atorvastatin  40 mg Oral Daily  . calcium acetate  1,334 mg Oral TID WC  . carvedilol  6.25 mg Oral BID  . doxazosin  8 mg Oral Daily  . furosemide  40 mg Oral Daily  . heparin  5,000 Units Subcutaneous Q8H  . insulin aspart  0-9 Units Subcutaneous TID WC  . insulin detemir  30 Units Subcutaneous BID AC & HS  . ipratropium-albuterol  3 mL Nebulization Q4H  . isosorbide mononitrate  30 mg Oral Daily  . lisinopril  20 mg Oral Daily  . mouth rinse  15 mL Mouth Rinse BID  . pantoprazole  40 mg Oral Daily  . spironolactone  25 mg Oral Daily   acetaminophen, docusate sodium, ondansetron  Assessment/ Plan:  76 y.o. caucasian male with diabetes mellitus type II insulin dependent, hypertension, coronary artery disease, hyperlipidemia, prostate cancer status post prostatectomy, right total knee, appendectomy.   MWF [facility] AV[facility]  1. End Stage Renal Disease MWF: - Patient seen at bedside. He continues to have signs and symptoms of volume overload. We will plan for dialysis today as well as tomorrow to increase  ultrafiltration.  2.  Anemia of chronic kidney disease: Hemoglobin 10.3. Hold off on Epogen for now.  3.  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  - Management as per hospitalist.  4. Secondary Hyperparathyroidism:   - Follow-up serum phosphorus today, otherwise continue binders.  5. Acute respiratory failure:  Appears to be multifactorial. However heart failure playing some role as the patient does have pulmonary edema. We will plan for ultrafiltration today and tomorrow.    LOS: 2 Margarine Grosshans 7/20/201811:23 AM

## 2016-10-18 NOTE — Progress Notes (Signed)
HD STARTED  

## 2016-10-18 NOTE — Progress Notes (Signed)
HD COMPLETED  

## 2016-10-18 NOTE — Care Management (Signed)
Patient requiring acute nocturnal BIPAP

## 2016-10-18 NOTE — Care Management Important Message (Signed)
Important Message  Patient Details  Name: Calvin Good MRN: [id number] Date of Birth: 08-Jan-1941   Medicare Important Message Given:  Yes    Beverly Sessions, RN 10/18/2016, 11:24 AM

## 2016-10-18 NOTE — Progress Notes (Signed)
[facility] [place] NAME: Calvin Good    MR#:  [id number]  DATE OF BIRTH:  [birth year]  SUBJECTIVE:   Patient is still having some shortness of breath and exertion. CT angiogram yesterday showing no pulmonary embolism but a large left-sided pleural effusion. Seen by pulmonary and they recommend aggressive volume removal with dialysis.  REVIEW OF SYSTEMS:    Review of Systems  Constitutional: Negative for chills and fever.  HENT: Negative for congestion and tinnitus.   Eyes: Negative for blurred vision and double vision.  Respiratory: Positive for cough and shortness of breath. Negative for wheezing.   Cardiovascular: Negative for chest pain, orthopnea and PND.  Gastrointestinal: Negative for abdominal pain, diarrhea, nausea and vomiting.  Genitourinary: Negative for dysuria and hematuria.  Neurological: Positive for weakness. Negative for dizziness, sensory change and focal weakness.  All other systems reviewed and are negative.   Nutrition: Renal w/ fluid restriction.  Tolerating Diet: Yes Tolerating PT: Await Eval.   DRUG ALLERGIES:   Allergies  Allergen Reactions  . Tetracyclines & Related Other (See Comments)    Reaction: unknown happened a long time ago and pt doesn't remember reaction   . Morphine Other (See Comments)    Reaction: confusion    VITALS:  Blood pressure (!) 164/67, pulse 90, temperature 97.7 F (36.5 C), temperature source Oral, resp. rate 20, height 5\' 9"  (1.753 m), weight 102.9 kg (226 lb 14.4 oz), SpO2 93 %.  PHYSICAL EXAMINATION:   Physical Exam  GENERAL:  76 y.o.-year-old patient lying in bed in mild Resp. Distress.  EYES: Pupils equal, round, reactive to light and accommodation. No scleral icterus. Extraocular muscles intact.  HEENT: Head atraumatic, normocephalic. Oropharynx and nasopharynx clear.  NECK:  Supple, no jugular venous distention. No thyroid enlargement, no tenderness.  LUNGS:  Prolonged Insp. & exp. phase, no wheezing, rales, rhonchi. Poor A/E on the left lower lung fields. No use of accessory muscles of respiration.  CARDIOVASCULAR: S1, S2 normal. No murmurs, rubs, or gallops.  ABDOMEN: Soft, nontender, nondistended. Bowel sounds present. No organomegaly or mass.  EXTREMITIES: No cyanosis, clubbing or edema b/l.    NEUROLOGIC: Cranial nerves II through XII are intact. No focal Motor or sensory deficits b/l. Globally weak.    PSYCHIATRIC: The patient is alert and oriented x 3.  SKIN: No obvious rash, lesion, or ulcer.    LABORATORY PANEL:   CBC  Recent Labs Lab 10/17/16 0006  WBC 7.1  HGB 10.3*  HCT 30.4*  PLT 168   ------------------------------------------------------------------------------------------------------------------  Chemistries   Recent Labs Lab 10/16/16 1942 10/17/16 0006  NA 137 138  K 2.9* 3.3*  CL 99* 99*  CO2 30 31  GLUCOSE 157* 182*  BUN 13 17  CREATININE 2.38* 2.84*  CALCIUM 8.6* 8.9  AST 20  --   ALT 12*  --   ALKPHOS 43  --   BILITOT 0.6  --    ------------------------------------------------------------------------------------------------------------------  Cardiac Enzymes  Recent Labs Lab 10/16/16 1942  TROPONINI <0.03   ------------------------------------------------------------------------------------------------------------------  RADIOLOGY:  Ct Angio Chest Pe W Or Wo Contrast  Result Date: 10/17/2016 CLINICAL DATA:  Acute onset of shortness of breath that began earlier today. Current history of end-stage renal disease on hemodialysis, chronic diastolic CHF, hypertension and COPD. EXAM: CT ANGIOGRAPHY CHEST WITH CONTRAST TECHNIQUE: Multidetector CT imaging of the chest was performed using the standard protocol during bolus administration of intravenous contrast. Multiplanar CT image reconstructions and MIPs were obtained to  evaluate the vascular anatomy. CONTRAST:  75 mL Isovue 370 IV. COMPARISON:   Unenhanced CT chest 09/14/2016. FINDINGS: Cardiovascular: Contrast opacification of the pulmonary arteries is good. No filling defects within either main pulmonary artery or their segmental branches in either lung to suggest pulmonary embolism. Cardiac silhouette moderately enlarged. Left ventricular hypertrophy. Moderate aortic valvular calcification. Severe three-vessel coronary atherosclerosis. No pericardial effusion. Prominent epicardial fat. Severe atherosclerosis involving the thoracic and upper abdominal aorta without evidence of aneurysm. Mediastinum/Nodes: Mediastinal and bilateral hilar lymphadenopathy, the largest nodal mass in the subcarinal region measuring approximately 3.7 x 5.1 cm (series 4, image 52). Conglomerate right hilar lymph nodes measure in total approximately 4.4 x 1.8 cm (series 4, image 47). Conglomerate of left lower paratracheal lymph nodes measure approximately 4.4 x 2.7 cm (series 4, image 36). This lymphadenopathy is unchanged from the prior CT. No axillary lymphadenopathy. Scattered subcentimeter nodules involving the atrophic thyroid gland without a dominant nodule. Lungs/Pleura: Emphysematous changes throughout both lungs. Mild to moderate diffuse interstitial pulmonary edema. Large right pleural effusion and moderate-sized left pleural effusion with associated passive atelectasis in the lower lobes, right greater than left. No confluent airspace consolidation elsewhere. Central airways patent. Upper Abdomen: Atrophic pancreas with an approximate 1 cm cystic lesion involving the tail, with enhancement in the surrounding tissue that is greater than the enhancement of the remainder of the pancreas. Calcified gallstones within the contracted gallbladder, the largest stone measuring approximately 1.3 cm. Simple cyst arising from the visualized upper poles of both kidneys. Musculoskeletal: Degenerative changes throughout the thoracic spine. Exaggeration of the usual thoracic kyphosis.  Osseous demineralization. No acute abnormalities. Review of the MIP images confirms the above findings. IMPRESSION: 1. No evidence pulmonary embolism. 2. CHF with moderate interstitial pulmonary edema. 3. Large left pleural effusion and moderate-sized right pleural effusion with associated passive atelectasis involving the lower lobes bilaterally, right greater than left. 4. COPD/emphysema. 5. Cardiomegaly.  Three-vessel coronary artery atherosclerosis. 6. Thoracic and upper abdominal aortic atherosclerosis without evidence aneurysm. 7. 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 However, as there is enhancement in the tissue surrounding the cystic lesion which is greater than that of the remainder of the pancreas, an early malignancy is not excluded. Abdominal CT without and with contrast (pancreatic protocol CT) is recommended in further evaluation when the patient's acute illness has improved. 8. Cholelithiasis. Aortic Atherosclerosis (ICD10-I70.0) and Emphysema (ICD10-J43.9). Electronically Signed   By: Evangeline Dakin M.D.   On: 10/17/2016 16:22   Dg Chest Port 1 View  Result Date: 10/17/2016 CLINICAL DATA:  Increasing dyspnea EXAM: PORTABLE CHEST 1 VIEW COMPARISON:  10/16/2016 FINDINGS: Stable cardiomegaly with aortic atherosclerosis. Interval increase in interstitial edema and small right pleural effusion. Superimposed pneumonia at the right lung base is not entirely excluded given its slightly more confluent appearance. Some of this however may be due to the pleural effusion. No acute nor suspicious osseous abnormality. IMPRESSION: 1. Interval increase in interstitial edema and small right effusion. 2. Stable cardiomegaly with aortic atherosclerosis. 3. Confluent appearance the right lung base may represent a small pneumonia or superimposed pleural fluid as initially suspected on prior exam. Electronically Signed    By: Ashley Royalty M.D.   On: 10/17/2016 02:43   Dg Chest Portable 1 View  Result Date: 10/16/2016 CLINICAL DATA:  Shortness of Breath EXAM: PORTABLE CHEST 1 VIEW COMPARISON:  October 10, 2016 FINDINGS: There is patchy consolidation in the right base with  small right pleural effusion. There is slight left base atelectasis. Lungs elsewhere clear. Heart is enlarged with pulmonary vascularity within normal limits. There is aortic atherosclerosis. No evident adenopathy. No bone lesions. IMPRESSION: Patchy consolidation right base, likely pneumonia. Small right pleural effusion. Left lung clear except for rather minimal left base atelectasis. Stable cardiomegaly. There is aortic atherosclerosis. Aortic Atherosclerosis (ICD10-I70.0). Electronically Signed   By: Lowella Grip III M.D.   On: 10/16/2016 20:21     ASSESSMENT AND PLAN:   76 year old male with past medical history of end-stage renal disease on hemodialysis, essential hypertension, hyperlipidemia, GERD, COPD, history of chronic diastolic CHF who presents to the hospital due to shortness of breath.  1. Acute on chronic respiratory failure with hypoxia- due to CHF, volume overload.  -Status post recent right-sided thoracentesis with 1.2 L of fluid removed. - CT chest yesterday showing a large left-sided pleural effusion. I will order a ultrasound-guided thoracentesis for the left effusion. - off abx now. Afebrile, hemodynamically stable.  Appreciate pulmonary input and patient likely needs aggressive fluid removal with dialysis to help with his hypoxia.  2. History of chronic diastolic CHF-patient remains volume overloaded based on his CT scan and his symptoms. -Continue Lasix, Aldactone, carvedilol, lisinopril. -We will get left-sided thoracentesis today as mentioned. Continue aggressive fluid removal with hemodialysis.  3. End-stage renal disease on hemodialysis-nephrology has been consulted. Continue dialysis on a scheduled days of Monday  Wednesday and Friday.  4. Essential hypertension-continue carvedilol, lisinopril, Imdur.  5. GERD-continue Protonix.  6. Diabetes type 2 without complication-continue Levemir  - cont. sliding scale insulin. BS Stable.   7. COPD-no acute exacerbation-continue duo nebs.  8. Secondary hyperparathyroidism-continue PhosLo.  Updated Daughter over the phone about the plan of care.   All the records are reviewed and case discussed with Care Management/Social Worker. Management plans discussed with the patient, family and they are in agreement.  CODE STATUS: Full code  DVT Prophylaxis: Hep. SQ  TOTAL TIME TAKING CARE OF THIS PATIENT: 30 minutes.   POSSIBLE D/C IN 1-2 DAYS, DEPENDING ON CLINICAL CONDITION.   Henreitta Leber M.D on 10/18/2016 at 2:25 PM  Between 7am to 6pm - Pager - [phone]  After 6pm go to [email or web address] - [employer] [facility]  Office  [phone]  CC: Primary care physician; [name], MD

## 2016-10-18 NOTE — Progress Notes (Signed)
POST DIALYSIS ASSESSMENT 

## 2016-10-18 NOTE — Progress Notes (Signed)
Pre dialysis assessment 

## 2016-10-19 LAB — GLUCOSE, CAPILLARY
GLUCOSE-CAPILLARY: 181 mg/dL — AB (ref 65–99)
GLUCOSE-CAPILLARY: 198 mg/dL — AB (ref 65–99)
Glucose-Capillary: 183 mg/dL — ABNORMAL HIGH (ref 65–99)
Glucose-Capillary: 90 mg/dL (ref 65–99)

## 2016-10-19 LAB — PHOSPHORUS: PHOSPHORUS: 1.7 mg/dL — AB (ref 2.5–4.6)

## 2016-10-19 MED ORDER — ZOLPIDEM TARTRATE 5 MG PO TABS
5.0000 mg | ORAL_TABLET | Freq: Every evening | ORAL | Status: DC | PRN
  Administered 2016-10-19 (×2): 5 mg via ORAL
  Filled 2016-10-19 (×2): qty 1

## 2016-10-19 NOTE — Progress Notes (Addendum)
Pt transferred to [facility] from dialysis. He is A&O x 4 with no complaints. VSS: BP (!) 169/60   Pulse 83   Temp 98.2 F (36.8 C) (Oral)   Resp 16   Ht 5\' 9"  (1.753 m)   Wt 101.3 kg (223 lb 5.2 oz)   SpO2 97%   BMI 32.98 kg/m   CBG: 90. Pt is resting in bed, dinner tray has been ordered and he has no complaints at this time.   Update 1850: Due to Pt's BP being elevated I spoke to Dr. Doy Hutching to see if pt should receive anything additional besides his coreg to treat his BP. Per Dr. Doy Hutching we will administer the pt's coreg and reassess blood pressure, but will not give the pt anything in addition to the coreg at this time.

## 2016-10-19 NOTE — Progress Notes (Signed)
Patient remains alert and oriented, denies any pain at this time, vss, rounded with MD at bedside, patient update plan of care, patient to get dialyzed today as per order

## 2016-10-19 NOTE — Progress Notes (Signed)
HD COMPLETED  

## 2016-10-19 NOTE — Progress Notes (Signed)
[facility]  ROUNDING NOTE   Subjective:  Patient completed hemodialysis yesterday. We have scheduled an extra dialysis session today for additional ultrafiltration. Patient achieved 2 kg of ultrafiltration yesterday.   Objective:  Vital signs in last 24 hours:  Temp:  [97.8 F (36.6 C)-98.5 F (36.9 C)] 98.5 F (36.9 C) (07/21 0847) Pulse Rate:  [80-93] 85 (07/21 0847) Resp:  [14-20] 20 (07/21 0542) BP: (147-168)/(41-71) 166/50 (07/21 0847) SpO2:  [93 %-97 %] 96 % (07/21 0847)  Weight change:  Filed Weights   10/16/16 1940 10/17/16 0002  Weight: 104.3 kg (230 lb) 102.9 kg (226 lb 14.4 oz)    Intake/Output: I/O last 3 completed shifts: In: 720 [P.O.:720] Out: 2450 [Urine:450; Other:2000]   Intake/Output this shift:  Total I/O In: 240 [P.O.:240] Out: -   Physical Exam: General: No acute distress  Head: Normocephalic, atraumatic. Moist oral mucosal membranes  Eyes: Anicteric  Neck: Supple, trachea midline  Lungs:  Diminished breath sounds at the right base, normal effort   Heart: S1S2 no rubs  Abdomen:  Soft, nontender, bowel sounds present  Extremities: trace peripheral edema.  Neurologic: Awake, alert, following commands  Skin: No lesions  Access: Left upper extremity AV fistula     Basic Metabolic Panel:  Recent Labs Lab 10/16/16 1942 10/17/16 0006 10/18/16 1725  NA 137 138  --   K 2.9* 3.3*  --   CL 99* 99*  --   CO2 30 31  --   GLUCOSE 157* 182*  --   BUN 13 17  --   CREATININE 2.38* 2.84*  --   CALCIUM 8.6* 8.9  --   PHOS  --   --  2.3*    Liver Function Tests:  Recent Labs Lab 10/16/16 1942  AST 20  ALT 12*  ALKPHOS 43  BILITOT 0.6  PROT 6.5  ALBUMIN 3.2*   No results for input(s): LIPASE, AMYLASE in the last 168 hours. No results for input(s): AMMONIA in the last 168 hours.  CBC:  Recent Labs Lab 10/16/16 1942 10/17/16 0006  WBC 6.6 7.1  NEUTROABS 4.4  --   HGB 9.4* 10.3*  HCT 27.4* 30.4*  MCV 87.0 86.4   PLT 152 168    Cardiac Enzymes:  Recent Labs Lab 10/16/16 1942  TROPONINI <0.03    BNP: Invalid input(s): POCBNP  CBG:  Recent Labs Lab 10/18/16 0743 10/18/16 1247 10/18/16 2059 10/18/16 2126 10/19/16 0804  GLUCAP 143* 189* 122* 140* 181*    Microbiology: Results for orders placed or performed during the hospital encounter of 10/16/16  Urine culture     Status: Abnormal   Collection Time: 10/17/16  6:43 AM  Result Value Ref Range Status   Specimen Description URINE, RANDOM  Final   Special Requests NONE  Final   Culture (A)  Final    <10,000 COLONIES/mL INSIGNIFICANT GROWTH Performed at [facility], [place] [address] [place], [place] [address]    Report Status 10/18/2016 FINAL  Final  MRSA PCR Screening     Status: None   Collection Time: 10/17/16  6:36 PM  Result Value Ref Range Status   MRSA by PCR NEGATIVE NEGATIVE Final    Comment:        The GeneXpert MRSA Assay (FDA approved for NASAL specimens only), is one component of a comprehensive MRSA colonization surveillance program. It is not intended to diagnose MRSA infection nor to guide or monitor treatment for MRSA infections.     Coagulation  Studies: No results for input(s): LABPROT, INR in the last 72 hours.  Urinalysis: No results for input(s): COLORURINE, LABSPEC, PHURINE, GLUCOSEU, HGBUR, BILIRUBINUR, KETONESUR, PROTEINUR, UROBILINOGEN, NITRITE, LEUKOCYTESUR in the last 72 hours.  Invalid input(s): APPERANCEUR    Imaging: Dg Chest 1 View  Result Date: 10/18/2016 CLINICAL DATA:  Post right-sided thoracentesis. EXAM: CHEST 1 VIEW COMPARISON:  CT 10/17/2016.  Chest x-ray 10/16/2016 . FINDINGS: Cardiomegaly with mild bilateral interstitial prominence and small left pleural effusion. Findings suggest mild CHF. Improvement from prior exam. No significant right pleural effusion. No pneumothorax post thoracentesis. IMPRESSION: 1.  No pneumothorax post thoracentesis. 2. Mild CHF .  Electronically Signed   By: Marcello Moores  Register   On: 10/18/2016 14:54   Ct Angio Chest Pe W Or Wo Contrast  Result Date: 10/17/2016 CLINICAL DATA:  Acute onset of shortness of breath that began earlier today. Current history of end-stage renal disease on hemodialysis, chronic diastolic CHF, hypertension and COPD. EXAM: CT ANGIOGRAPHY CHEST WITH CONTRAST TECHNIQUE: Multidetector CT imaging of the chest was performed using the standard protocol during bolus administration of intravenous contrast. Multiplanar CT image reconstructions and MIPs were obtained to evaluate the vascular anatomy. CONTRAST:  75 mL Isovue 370 IV. COMPARISON:  Unenhanced CT chest 09/14/2016. FINDINGS: Cardiovascular: Contrast opacification of the pulmonary arteries is good. No filling defects within either main pulmonary artery or their segmental branches in either lung to suggest pulmonary embolism. Cardiac silhouette moderately enlarged. Left ventricular hypertrophy. Moderate aortic valvular calcification. Severe three-vessel coronary atherosclerosis. No pericardial effusion. Prominent epicardial fat. Severe atherosclerosis involving the thoracic and upper abdominal aorta without evidence of aneurysm. Mediastinum/Nodes: Mediastinal and bilateral hilar lymphadenopathy, the largest nodal mass in the subcarinal region measuring approximately 3.7 x 5.1 cm (series 4, image 52). Conglomerate right hilar lymph nodes measure in total approximately 4.4 x 1.8 cm (series 4, image 47). Conglomerate of left lower paratracheal lymph nodes measure approximately 4.4 x 2.7 cm (series 4, image 36). This lymphadenopathy is unchanged from the prior CT. No axillary lymphadenopathy. Scattered subcentimeter nodules involving the atrophic thyroid gland without a dominant nodule. Lungs/Pleura: Emphysematous changes throughout both lungs. Mild to moderate diffuse interstitial pulmonary edema. Large right pleural effusion and moderate-sized left pleural effusion with  associated passive atelectasis in the lower lobes, right greater than left. No confluent airspace consolidation elsewhere. Central airways patent. Upper Abdomen: Atrophic pancreas with an approximate 1 cm cystic lesion involving the tail, with enhancement in the surrounding tissue that is greater than the enhancement of the remainder of the pancreas. Calcified gallstones within the contracted gallbladder, the largest stone measuring approximately 1.3 cm. Simple cyst arising from the visualized upper poles of both kidneys. Musculoskeletal: Degenerative changes throughout the thoracic spine. Exaggeration of the usual thoracic kyphosis. Osseous demineralization. No acute abnormalities. Review of the MIP images confirms the above findings. IMPRESSION: 1. No evidence pulmonary embolism. 2. CHF with moderate interstitial pulmonary edema. 3. Large left pleural effusion and moderate-sized right pleural effusion with associated passive atelectasis involving the lower lobes bilaterally, right greater than left. 4. COPD/emphysema. 5. Cardiomegaly.  Three-vessel coronary artery atherosclerosis. 6. Thoracic and upper abdominal aortic atherosclerosis without evidence aneurysm. 7. 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 However, as there is enhancement in the tissue surrounding the cystic lesion which is greater than that of the remainder of the pancreas, an early malignancy is not excluded. Abdominal CT without and with contrast (pancreatic protocol CT) is recommended  in further evaluation when the patient's acute illness has improved. 8. Cholelithiasis. Aortic Atherosclerosis (ICD10-I70.0) and Emphysema (ICD10-J43.9). Electronically Signed   By: Evangeline Dakin M.D.   On: 10/17/2016 16:22   US Thoracentesis Asp Pleural Space W/img Guide  Result Date: 10/18/2016 INDICATION: Congestive heart failure. Recurrent right pleural effusion.  Request therapeutic thoracentesis. EXAM: ULTRASOUND GUIDED RIGHT THORACENTESIS MEDICATIONS: None. COMPLICATIONS: None immediate. Postprocedural chest x-ray negative for pneumothorax. PROCEDURE: An ultrasound guided thoracentesis was thoroughly discussed with the patient and questions answered. The benefits, risks, alternatives and complications were also discussed. The patient understands and wishes to proceed with the procedure. Written consent was obtained. Ultrasound was performed to localize and mark an adequate pocket of fluid in the right chest. The area was then prepped and draped in the normal sterile fashion. 1% Lidocaine was used for local anesthesia. Under ultrasound guidance a Safe-T-Centesis catheter was introduced. Thoracentesis was performed. The catheter was removed and a dressing applied. FINDINGS: A total of approximately 1.5 L of clear yellow fluid was removed. IMPRESSION: Successful ultrasound guided right thoracentesis yielding 1.5 L of pleural fluid. Read by: Ascencion Dike PA-C Electronically Signed   By: Jerilynn Mages.  Shick M.D.   On: 10/18/2016 14:55     Medications:    . aspirin  81 mg Oral Daily  . atorvastatin  40 mg Oral Daily  . calcium acetate  1,334 mg Oral TID WC  . carvedilol  6.25 mg Oral BID  . doxazosin  8 mg Oral Daily  . furosemide  40 mg Oral Daily  . heparin  5,000 Units Subcutaneous Q8H  . insulin aspart  0-9 Units Subcutaneous TID WC  . insulin detemir  30 Units Subcutaneous BID AC & HS  . ipratropium-albuterol  3 mL Nebulization Q4H  . isosorbide mononitrate  30 mg Oral Daily  . lisinopril  20 mg Oral Daily  . mouth rinse  15 mL Mouth Rinse BID  . pantoprazole  40 mg Oral Daily  . spironolactone  25 mg Oral Daily   acetaminophen, docusate sodium, ondansetron, zolpidem  Assessment/ Plan:  76 y.o. caucasian male with diabetes mellitus type II insulin dependent, hypertension, coronary artery disease, hyperlipidemia, prostate cancer status post prostatectomy,  right total knee, appendectomy.   MWF [facility] [place] AV[facility]  1. End Stage Renal Disease MWF: - Patient seen at bedside. He will be due for an extra dialysis session today for additional volume removal as recommended by pulmonary medicine.  2.  Anemia of chronic kidney disease: Hemoglobin 10.3 last check. Continue to hold Epogen.  3.  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  - Management as per hospitalist.  4. Secondary Hyperparathyroidism:   - Phosphorous was not drawn yesterday. Redraw this today.  5. Acute respiratory failure:  Clinically improved with ultrafiltration. As above we are planning for another dialysis session today with additional ultrafiltration. Patient does report feeling better.    LOS: 3 Calvin Good 7/21/201811:40 AM

## 2016-10-19 NOTE — Progress Notes (Signed)
HD STARTED  

## 2016-10-19 NOTE — Progress Notes (Signed)
Physical Therapy Treatment Patient Details Name: Calvin Good MRN: [id number] DOB: 05/05/1940 Today's Date: 10/19/2016    History of Present Illness 76yo white male admitted with HCAP and sepsis, after having 1.5 L fluid removed in thoracentesis.  SOB, on HD and still inadequately diuresed for symptoms, anemic.  PMHx:  CHF, respiratory failure chronically, COPD, O2 at baseline, HD, ESRD, R TKA, incontinence, DM, hyperparathyroidism, metabolic encephalopathy.    PT Comments    Pt at 4 lpm at rest and for session.  Bed mobility and transfers without assist.  Gait around unit without assistive device and generally steady without LOB.  Some minor SOB after gait but generally tolerated well.  He stated he is walking with nursing on unit.   Follow Up Recommendations  Home health PT;Other (comment)  Out patient for cardiac rehab     Equipment Recommendations  None recommended by PT    Recommendations for Other Services       Precautions / Restrictions Precautions Precautions: Fall Restrictions Weight Bearing Restrictions: No    Mobility  Bed Mobility Overal bed mobility: Modified Independent                Transfers Overall transfer level: Modified independent   Transfers: Sit to/from Stand Sit to Stand: Modified independent (Device/Increase time)            Ambulation/Gait Ambulation/Gait assistance: Modified independent (Device/Increase time);Supervision Ambulation Distance (Feet): 160 Feet Assistive device: Rolling walker (2 wheeled) Gait Pattern/deviations: Step-through pattern   Gait velocity interpretation: Below normal speed for age/gender General Gait Details: Generally steady without difficulty   Stairs            Wheelchair Mobility    Modified Rankin (Stroke Patients Only)       Balance Overall balance assessment: Modified Independent                                          Cognition Arousal/Alertness:  Awake/alert Behavior During Therapy: WFL for tasks assessed/performed Overall Cognitive Status: Within Functional Limits for tasks assessed                                        Exercises      General Comments        Pertinent Vitals/Pain Pain Assessment: No/denies pain    Home Living                      Prior Function            PT Goals (current goals can now be found in the care plan section) Progress towards PT goals: Progressing toward goals    Frequency    Min 2X/week      PT Plan Current plan remains appropriate    Co-evaluation              AM-PAC PT "6 Clicks" Daily Activity  Outcome Measure  Difficulty turning over in bed (including adjusting bedclothes, sheets and blankets)?: None Difficulty moving from lying on back to sitting on the side of the bed? : None Difficulty sitting down on and standing up from a chair with arms (e.g., wheelchair, bedside commode, etc,.)?: None Help needed moving to and from a bed to chair (including a wheelchair)?: None Help needed walking in hospital  room?: A Little   6 Click Score: 19    End of Session Equipment Utilized During Treatment: Gait belt;Oxygen Activity Tolerance: Patient tolerated treatment well           Time: 9604-5409 PT Time Calculation (min) (ACUTE ONLY): 8 min  Charges:  $Gait Training: 8-22 mins                    G Codes:       Chesley Noon, PTA 10/19/16, 12:08 PM

## 2016-10-19 NOTE — Progress Notes (Signed)
Pre dialysis assessment 

## 2016-10-19 NOTE — Progress Notes (Signed)
[facility] [place] NAME: Calvin Good    MR#:  [id number]  DATE OF BIRTH:  [birth year]  SUBJECTIVE:   Pt. Shortness of breath has improved since having thoracentesis yesterday.  Patient to have hemodialysis today. No other acute events overnight.  REVIEW OF SYSTEMS:    Review of Systems  Constitutional: Negative for chills and fever.  HENT: Negative for congestion and tinnitus.   Eyes: Negative for blurred vision and double vision.  Respiratory: Positive for shortness of breath. Negative for cough and wheezing.   Cardiovascular: Negative for chest pain, orthopnea and PND.  Gastrointestinal: Negative for abdominal pain, diarrhea, nausea and vomiting.  Genitourinary: Negative for dysuria and hematuria.  Neurological: Positive for weakness. Negative for dizziness, sensory change and focal weakness.  All other systems reviewed and are negative.   Nutrition: Renal w/ fluid restriction.  Tolerating Diet: Yes Tolerating PT: Eval noted.   DRUG ALLERGIES:   Allergies  Allergen Reactions  . Tetracyclines & Related Other (See Comments)    Reaction: unknown happened a long time ago and pt doesn't remember reaction   . Morphine Other (See Comments)    Reaction: confusion    VITALS:  Blood pressure (!) 146/48, pulse 76, temperature 98.2 F (36.8 C), temperature source Oral, resp. rate (!) 21, height 5\' 9"  (1.753 m), weight 102.9 kg (226 lb 14.4 oz), SpO2 97 %.  PHYSICAL EXAMINATION:   Physical Exam  GENERAL:  76 y.o.-year-old patient lying in bed in NAD. EYES: Pupils equal, round, reactive to light and accommodation. No scleral icterus. Extraocular muscles intact.  HEENT: Head atraumatic, normocephalic. Oropharynx and nasopharynx clear.  NECK:  Supple, no jugular venous distention. No thyroid enlargement, no tenderness.  LUNGS: Prolonged Insp. & exp. phase, no wheezing, rales, rhonchi. Poor A/E on the left lower lung fields. No use of  accessory muscles of respiration.  CARDIOVASCULAR: S1, S2 normal. No murmurs, rubs, or gallops.  ABDOMEN: Soft, nontender, nondistended. Bowel sounds present. No organomegaly or mass.  EXTREMITIES: No cyanosis, clubbing or edema b/l.    NEUROLOGIC: Cranial nerves II through XII are intact. No focal Motor or sensory deficits b/l. Globally weak.    PSYCHIATRIC: The patient is alert and oriented x 3.  SKIN: No obvious rash, lesion, or ulcer.   Left upper ext. AV fistula with good bruit & Thrill.    LABORATORY PANEL:   CBC  Recent Labs Lab 10/17/16 0006  WBC 7.1  HGB 10.3*  HCT 30.4*  PLT 168   ------------------------------------------------------------------------------------------------------------------  Chemistries   Recent Labs Lab 10/16/16 1942 10/17/16 0006  NA 137 138  K 2.9* 3.3*  CL 99* 99*  CO2 30 31  GLUCOSE 157* 182*  BUN 13 17  CREATININE 2.38* 2.84*  CALCIUM 8.6* 8.9  AST 20  --   ALT 12*  --   ALKPHOS 43  --   BILITOT 0.6  --    ------------------------------------------------------------------------------------------------------------------  Cardiac Enzymes  Recent Labs Lab 10/16/16 1942  TROPONINI <0.03   ------------------------------------------------------------------------------------------------------------------  RADIOLOGY:  Dg Chest 1 View  Result Date: 10/18/2016 CLINICAL DATA:  Post right-sided thoracentesis. EXAM: CHEST 1 VIEW COMPARISON:  CT 10/17/2016.  Chest x-ray 10/16/2016 . FINDINGS: Cardiomegaly with mild bilateral interstitial prominence and small left pleural effusion. Findings suggest mild CHF. Improvement from prior exam. No significant right pleural effusion. No pneumothorax post thoracentesis. IMPRESSION: 1.  No pneumothorax post thoracentesis. 2. Mild CHF . Electronically Signed   By: Marcello Moores  Register  On: 10/18/2016 14:54   Ct Angio Chest Pe W Or Wo Contrast  Result Date: 10/17/2016 CLINICAL DATA:  Acute onset of  shortness of breath that began earlier today. Current history of end-stage renal disease on hemodialysis, chronic diastolic CHF, hypertension and COPD. EXAM: CT ANGIOGRAPHY CHEST WITH CONTRAST TECHNIQUE: Multidetector CT imaging of the chest was performed using the standard protocol during bolus administration of intravenous contrast. Multiplanar CT image reconstructions and MIPs were obtained to evaluate the vascular anatomy. CONTRAST:  75 mL Isovue 370 IV. COMPARISON:  Unenhanced CT chest 09/14/2016. FINDINGS: Cardiovascular: Contrast opacification of the pulmonary arteries is good. No filling defects within either main pulmonary artery or their segmental branches in either lung to suggest pulmonary embolism. Cardiac silhouette moderately enlarged. Left ventricular hypertrophy. Moderate aortic valvular calcification. Severe three-vessel coronary atherosclerosis. No pericardial effusion. Prominent epicardial fat. Severe atherosclerosis involving the thoracic and upper abdominal aorta without evidence of aneurysm. Mediastinum/Nodes: Mediastinal and bilateral hilar lymphadenopathy, the largest nodal mass in the subcarinal region measuring approximately 3.7 x 5.1 cm (series 4, image 52). Conglomerate right hilar lymph nodes measure in total approximately 4.4 x 1.8 cm (series 4, image 47). Conglomerate of left lower paratracheal lymph nodes measure approximately 4.4 x 2.7 cm (series 4, image 36). This lymphadenopathy is unchanged from the prior CT. No axillary lymphadenopathy. Scattered subcentimeter nodules involving the atrophic thyroid gland without a dominant nodule. Lungs/Pleura: Emphysematous changes throughout both lungs. Mild to moderate diffuse interstitial pulmonary edema. Large right pleural effusion and moderate-sized left pleural effusion with associated passive atelectasis in the lower lobes, right greater than left. No confluent airspace consolidation elsewhere. Central airways patent. Upper Abdomen:  Atrophic pancreas with an approximate 1 cm cystic lesion involving the tail, with enhancement in the surrounding tissue that is greater than the enhancement of the remainder of the pancreas. Calcified gallstones within the contracted gallbladder, the largest stone measuring approximately 1.3 cm. Simple cyst arising from the visualized upper poles of both kidneys. Musculoskeletal: Degenerative changes throughout the thoracic spine. Exaggeration of the usual thoracic kyphosis. Osseous demineralization. No acute abnormalities. Review of the MIP images confirms the above findings. IMPRESSION: 1. No evidence pulmonary embolism. 2. CHF with moderate interstitial pulmonary edema. 3. Large left pleural effusion and moderate-sized right pleural effusion with associated passive atelectasis involving the lower lobes bilaterally, right greater than left. 4. COPD/emphysema. 5. Cardiomegaly.  Three-vessel coronary artery atherosclerosis. 6. Thoracic and upper abdominal aortic atherosclerosis without evidence aneurysm. 7. 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 However, as there is enhancement in the tissue surrounding the cystic lesion which is greater than that of the remainder of the pancreas, an early malignancy is not excluded. Abdominal CT without and with contrast (pancreatic protocol CT) is recommended in further evaluation when the patient's acute illness has improved. 8. Cholelithiasis. Aortic Atherosclerosis (ICD10-I70.0) and Emphysema (ICD10-J43.9). Electronically Signed   By: Evangeline Dakin M.D.   On: 10/17/2016 16:22   US Thoracentesis Asp Pleural Space W/img Guide  Result Date: 10/18/2016 INDICATION: Congestive heart failure. Recurrent right pleural effusion. Request therapeutic thoracentesis. EXAM: ULTRASOUND GUIDED RIGHT THORACENTESIS MEDICATIONS: None. COMPLICATIONS: None immediate. Postprocedural chest x-ray  negative for pneumothorax. PROCEDURE: An ultrasound guided thoracentesis was thoroughly discussed with the patient and questions answered. The benefits, risks, alternatives and complications were also discussed. The patient understands and wishes to proceed with the procedure. Written consent was obtained. Ultrasound was performed to localize and mark an adequate  pocket of fluid in the right chest. The area was then prepped and draped in the normal sterile fashion. 1% Lidocaine was used for local anesthesia. Under ultrasound guidance a Safe-T-Centesis catheter was introduced. Thoracentesis was performed. The catheter was removed and a dressing applied. FINDINGS: A total of approximately 1.5 L of clear yellow fluid was removed. IMPRESSION: Successful ultrasound guided right thoracentesis yielding 1.5 L of pleural fluid. Read by: Ascencion Dike PA-C Electronically Signed   By: Jerilynn Mages.  Shick M.D.   On: 10/18/2016 14:55     ASSESSMENT AND PLAN:   76 year old male with past medical history of end-stage renal disease on hemodialysis, essential hypertension, hyperlipidemia, GERD, COPD, history of chronic diastolic CHF who presents to the hospital due to shortness of breath.  1. Acute on chronic respiratory failure with hypoxia- due to CHF, volume overload.  -Status post recent right-sided thoracentesis with 1.2 L of fluid removed. -Status post thoracentesis again yesterday and 1.5 L of pleural fluid removed. Feels a lot better today. Plan for aggressive hemodialysis for fluid removal today. We'll repeat chest x-ray in the morning. -  Afebrile, hemodynamically stable.  Appreciate pulmonary input and patient likely needs aggressive fluid removal with dialysis to help with his hypoxia.  2. History of chronic diastolic CHF-patient remains volume overloaded based on his CT scan and his symptoms. -Continue Lasix, Aldactone, carvedilol, lisinopril. -Status post right-sided thoracentesis 1.5 L of fluid removed.  Clinically feels a lot better. Continue dialysis for extra fluid removal.  3. End-stage renal disease on hemodialysis-nephrology has been consulted. Continue dialysis on a scheduled days of Monday Wednesday and Friday.  - pt. To get HD for extra fluid removal today.   4. Essential hypertension-continue carvedilol, lisinopril, Imdur.  5. GERD-continue Protonix.  6. Diabetes type 2 without complication-continue Levemir  - cont. sliding scale insulin. BS Stable.   7. COPD-no acute exacerbation-continue duo nebs.  8. Secondary hyperparathyroidism-continue PhosLo.  Updated Daughter over the phone about the plan of care.  Plan for discharge tomorrow with Home Health services.   All the records are reviewed and case discussed with Care Management/Social Worker. Management plans discussed with the patient, family and they are in agreement.  CODE STATUS: Full code  DVT Prophylaxis: Hep. SQ  TOTAL TIME TAKING CARE OF THIS PATIENT: 30 minutes.   POSSIBLE D/C IN 1-2 DAYS, DEPENDING ON CLINICAL CONDITION.   Henreitta Leber M.D on 10/19/2016 at 2:01 PM  Between 7am to 6pm - Pager - [phone]  After 6pm go to [email or web address] - Technical brewer [facility]  Office  [phone]  CC: Primary care physician; [name], MD

## 2016-10-19 NOTE — Progress Notes (Signed)
Patient of the floor to dialysis, report given to dialysis nurse .

## 2016-10-20 ENCOUNTER — Inpatient Hospital Stay: Payer: Medicare Other

## 2016-10-20 LAB — GLUCOSE, CAPILLARY
Glucose-Capillary: 105 mg/dL — ABNORMAL HIGH (ref 65–99)
Glucose-Capillary: 226 mg/dL — ABNORMAL HIGH (ref 65–99)

## 2016-10-20 LAB — PHOSPHORUS: Phosphorus: 2.4 mg/dL — ABNORMAL LOW (ref 2.5–4.6)

## 2016-10-20 MED ORDER — IPRATROPIUM-ALBUTEROL 0.5-2.5 (3) MG/3ML IN SOLN: 3.0000 mL | Freq: Two times a day (BID) | RESPIRATORY_TRACT | Status: DC

## 2016-10-20 MED ORDER — LEVOFLOXACIN 250 MG PO TABS: 250.0000 mg | ORAL_TABLET | ORAL | 0 refills | Status: AC

## 2016-10-20 NOTE — Care Management Note (Signed)
Case Management Note  Patient Details  Name: Calvin Good MRN: [id number] Date of Birth: Jul 19, 1940  Subjective/Objective:      Per CM note from last week Mr Thum is an open client of [facility]. Referral for HH-PT, RN was called to Rema Jasmine at [facility].               Action/Plan:   Expected Discharge Date:  10/21/16               Expected Discharge Plan:     In-House Referral:     Discharge planning Services     Post Acute Care Choice:    Choice offered to:     DME Arranged:    DME Agency:     HH Arranged:    HH Agency:     Status of Service:     If discussed at [employer], dates discussed:    Additional Comments:  Gotham Raden A, RN 10/20/2016, 2:32 PM

## 2016-10-20 NOTE — Progress Notes (Signed)
PRE DIALYSIS ASSESSMENT 

## 2016-10-20 NOTE — Discharge Summary (Signed)
[facility] [place] NAME: Calvin Good    MR#:  [id number]  DATE OF BIRTH:  1940/07/06  DATE OF ADMISSION:  10/16/2016 ADMITTING PHYSICIAN: Vaughan Basta, MD  DATE OF DISCHARGE: 10/20/2016  PRIMARY CARE PHYSICIAN: [name], MD    ADMISSION DIAGNOSIS:  Dyspnea on exertion [R06.09] Acute on chronic respiratory failure with hypoxia (HCC) [J96.21]  DISCHARGE DIAGNOSIS:  Principal Problem:   HCAP (healthcare-associated pneumonia) Active Problems:   Acute on chronic respiratory failure ([place])   SECONDARY DIAGNOSIS:   Past Medical History:  Diagnosis Date  . Cancer [facility])    Prostate  . CHF (congestive heart failure) ([place])   . Chronic kidney disease   . Complication of anesthesia    hard to wake up  . COPD (chronic obstructive pulmonary disease) ([place])   . Diabetes mellitus without complication ([place])   . Difficult intubation   . Dyspnea   . GERD (gastroesophageal reflux disease)   . Hyperlipidemia   . Hypertension   . Pleural effusion   . Renal insufficiency     HOSPITAL COURSE:   76 year old male with past medical history of end-stage renal disease on hemodialysis, essential hypertension, hyperlipidemia, GERD, COPD, history of chronic diastolic CHF who presents to the hospital due to shortness of breath.  1. Acute on chronic respiratory failure with hypoxia- due to CHF, volume overload.  -Patient presented to the hospital with shortness of breath and hypoxia secondary to volume overload. He underwent a right-sided thoracentesis and had 1.5 L of pleural fluid removed. Post thoracentesis patient's shortness of breath has improved. He also received hemodialysis multiple times on the hospital for fluid removal and his hypoxia and shortness of breath is improved. -Initially patient was thought to have suspected pneumonia although that has been ruled out. Patient although he is empirically being discharged on oral  Levaquin for a few days.  -Patient is at his baseline 4 L of oxygen which he will continue upon discharge.  2. History of chronic diastolic CHF-patient presented to the hospital due to shortness of breath secondary to acute on chronic diastolic CHF. -As mentioned above patient did undergo a right-sided thoracentesis with 1.5 L of pleural fluid removed. His chest x-ray of date of discharge shows minimal pleural effusions. He will continue aggressive hemodialysis with failed fluid removal as an outpatient. - he will Continue Lasix, Aldactone, carvedilol, lisinopril. - he will cont. His chronic Home O2 at 4 L.   3. End-stage renal disease on hemodialysis-nephrology was consulted. Patient received extra hemodialysis sessions for fluid removal due to his respiratory failure and will resume his dialysis on Monday Wednesday and Friday. He will continue follow-up with his nephrologist Dr. Rolly Salter  as an outpatient. 4. Essential hypertension- he will continue carvedilol, lisinopril, Imdur.  5. GERD- he will continue Protonix.  6. 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  7. COPD-no acute exacerbation - pt. Will cont his duonebs as needed along with chronic O2.   8. Secondary hyperparathyroidism- he will continue PhosLo.   DISCHARGE CONDITIONS:   Stable.   CONSULTS OBTAINED:  Treatment Team:  Anthonette Legato, MD  DRUG ALLERGIES:   Allergies  Allergen Reactions  . Tetracyclines & Related Other (See Comments)    Reaction: unknown happened a long time ago and pt doesn't remember reaction   . Morphine Other (See Comments)    Reaction: confusion    DISCHARGE MEDICATIONS:  Allergies as of 10/20/2016      Reactions   Tetracyclines & Related Other (See Comments)   Reaction: unknown happened a long time ago and pt doesn't remember reaction    Morphine Other (See Comments)   Reaction:  confusion      Medication List    STOP taking these medications   guaiFENesin-dextromethorphan 100-10 MG/5ML syrup Commonly known as:  ROBITUSSIN DM   insulin NPH Human 100 UNIT/ML injection Commonly known as:  HUMULIN N,NOVOLIN N     TAKE these medications   acetaminophen 325 MG tablet Commonly known as:  TYLENOL Take 2 tablets (650 mg total) by mouth every 6 (six) hours as needed for mild pain (or Fever >/= 101).   amLODipine 5 MG tablet Commonly known as:  NORVASC Take 5 mg by mouth at bedtime.   aspirin 81 MG chewable tablet Chew 81 mg by mouth daily. 8 am   atorvastatin 40 MG tablet Commonly known as:  LIPITOR Take 40 mg by mouth daily. 8 pm   calcium acetate 667 MG capsule Commonly known as:  PHOSLO Take 1,334 mg by mouth 3 (three) times daily with meals. 2 caps at 8 am, 12 pm, 5 pm   carvedilol 6.25 MG tablet Commonly known as:  COREG Take 6.25 mg by mouth 2 (two) times daily. 8 am, 5 pm   dextrose 40 % Gel Commonly known as:  GLUTOSE Take 1 Tube by mouth every 30 (thirty) minutes as needed for low blood sugar. Or until BS > 70 and pt is still responsive.   doxazosin 8 MG tablet Commonly known as:  CARDURA Take 8 mg by mouth daily.   furosemide 40 MG tablet Commonly known as:  LASIX Take 40 mg by mouth daily. 8 am   GLUCAGON EMERGENCY 1 MG injection Generic drug:  glucagon Inject 1 mg into the muscle once as needed. If not responding to oral glucose, snack. May repeat x 1 after 15 minutes. Use only if pt is or is becoming unresponsive as needed.   insulin aspart 100 UNIT/ML injection Commonly known as:  novoLOG Inject 0-9 Units into the skin 3 (three) times daily with meals. CBG < 70: implement hypoglycemia protocol CBG 70 - 120: 0 units CBG 121 - 150: 1 unit CBG 151 - 200: 2 units CBG 201 - 250: 3 units CBG 251 - 300: 5 units CBG 301 - 350: 7 units CBG 351 - 400: 9 units CBG > 400: call MD and obtain STAT lab verification What changed:  how much to  take  additional instructions   insulin detemir 100 UNIT/ML injection Commonly known as:  LEVEMIR Inject 0.3 mLs (30 Units total) into the skin at bedtime.   ipratropium-albuterol 0.5-2.5 (3) MG/3ML Soln Commonly known as:  DUONEB Take 3 mLs by nebulization every 6 (six) hours as needed.   isosorbide mononitrate 30 MG 24 hr tablet Commonly known as:  IMDUR Take 1 tablet (30 mg total) by mouth daily.   levofloxacin 250 MG tablet Commonly known as:  LEVAQUIN Take 1 tablet (250 mg total) by mouth every other day.   Lidocaine-Prilocaine (Bulk) 2.5-2.5 % Crea Apply 1 application topically as needed. Apply to fistula site prior to dialysis treatments as needed   lisinopril 20 MG tablet Commonly known as:  PRINIVIL,ZESTRIL Take 20 mg by mouth daily. 8 am   omeprazole 20 MG capsule Commonly known as:  PRILOSEC Take 20 mg by mouth daily.   ondansetron 4 MG tablet Commonly known  as:  ZOFRAN Take 1 tablet (4 mg total) by mouth every 6 (six) hours as needed for nausea.   OXYGEN Inhale 2 L into the lungs continuous.   spironolactone 25 MG tablet Commonly known as:  ALDACTONE Take 25 mg by mouth daily. 8 am         DISCHARGE INSTRUCTIONS:   DIET:  Cardiac diet and Diabetic diet  DISCHARGE CONDITION:  Stable  ACTIVITY:  Activity as tolerated  OXYGEN:  Home Oxygen: Yes.     Oxygen Delivery: 4 liters/min via Patient connected to nasal cannula oxygen  DISCHARGE LOCATION:  Home with Home Health PT, RN.    If you experience worsening of your admission symptoms, develop shortness of breath, life threatening emergency, suicidal or homicidal thoughts you must seek medical attention immediately by calling 911 or calling your MD immediately  if symptoms less severe.  You Must read complete instructions/literature along with all the possible adverse reactions/side effects for all the Medicines you take and that have been prescribed to you. Take any new Medicines after you  have completely understood and accpet all the possible adverse reactions/side effects.   Please note  You were cared for by a hospitalist during your hospital stay. If you have any questions about your discharge medications or the care you received while you were in the hospital after you are discharged, you can call the unit and asked to speak with the hospitalist on call if the hospitalist that took care of you is not available. Once you are discharged, your primary care physician will handle any further medical issues. Please note that NO REFILLS for any discharge medications will be authorized once you are discharged, as it is imperative that you return to your primary care physician (or establish a relationship with a primary care physician if you do not have one) for your aftercare needs so that they can reassess your need for medications and monitor your lab values.     Today   Shortness of breath improved.  No chest pain, + cough but non-productive.   VITAL SIGNS:  Blood pressure (!) 164/51, pulse 92, temperature 98.4 F (36.9 C), temperature source Oral, resp. rate 18, height 5\' 9"  (1.753 m), weight 101.3 kg (223 lb 5.2 oz), SpO2 98 %.  I/O:    Intake/Output Summary (Last 24 hours) at 10/20/16 1427 Last data filed at 10/20/16 1340  Gross per 24 hour  Intake              480 ml  Output             2000 ml  Net            -1520 ml    PHYSICAL EXAMINATION:   GENERAL:  76 y.o.-year-old patient lying in bed in NAD. EYES: Pupils equal, round, reactive to light and accommodation. No scleral icterus. Extraocular muscles intact.  HEENT: Head atraumatic, normocephalic. Oropharynx and nasopharynx clear.  NECK:  Supple, no jugular venous distention. No thyroid enlargement, no tenderness.  LUNGS: Prolonged Insp. & exp. phase, no wheezing, rales at left base, No rhonchi.  No use of accessory muscles of respiration.  CARDIOVASCULAR: S1, S2 normal. No murmurs, rubs, or gallops.  ABDOMEN:  Soft, nontender, nondistended. Bowel sounds present. No organomegaly or mass.  EXTREMITIES: No cyanosis, clubbing or edema b/l.    NEUROLOGIC: Cranial nerves II through XII are intact. No focal Motor or sensory deficits b/l. Globally weak.    PSYCHIATRIC: The patient is alert and  oriented x 3.  SKIN: No obvious rash, lesion, or ulcer.   Left upper ext. AV fistula with good bruit & Thrill.   DATA REVIEW:   CBC  Recent Labs Lab 10/17/16 0006  WBC 7.1  HGB 10.3*  HCT 30.4*  PLT 168    Chemistries   Recent Labs Lab 10/16/16 1942 10/17/16 0006  NA 137 138  K 2.9* 3.3*  CL 99* 99*  CO2 30 31  GLUCOSE 157* 182*  BUN 13 17  CREATININE 2.38* 2.84*  CALCIUM 8.6* 8.9  AST 20  --   ALT 12*  --   ALKPHOS 43  --   BILITOT 0.6  --     Cardiac Enzymes  Recent Labs Lab 10/16/16 1942  TROPONINI <0.03    Microbiology Results  Results for orders placed or performed during the hospital encounter of 10/16/16  Urine culture     Status: Abnormal   Collection Time: 10/17/16  6:43 AM  Result Value Ref Range Status   Specimen Description URINE, RANDOM  Final   Special Requests NONE  Final   Culture (A)  Final    <10,000 COLONIES/mL INSIGNIFICANT GROWTH Performed at [facility], [place] [address] [place], [place] [address]    Report Status 10/18/2016 FINAL  Final  MRSA PCR Screening     Status: None   Collection Time: 10/17/16  6:36 PM  Result Value Ref Range Status   MRSA by PCR NEGATIVE NEGATIVE Final    Comment:        The GeneXpert MRSA Assay (FDA approved for NASAL specimens only), is one component of a comprehensive MRSA colonization surveillance program. It is not intended to diagnose MRSA infection nor to guide or monitor treatment for MRSA infections.     RADIOLOGY:  Dg Chest 1 View  Result Date: 10/18/2016 CLINICAL DATA:  Post right-sided thoracentesis. EXAM: CHEST 1 VIEW COMPARISON:  CT 10/17/2016.  Chest x-ray 10/16/2016 . FINDINGS: Cardiomegaly  with mild bilateral interstitial prominence and small left pleural effusion. Findings suggest mild CHF. Improvement from prior exam. No significant right pleural effusion. No pneumothorax post thoracentesis. IMPRESSION: 1.  No pneumothorax post thoracentesis. 2. Mild CHF . Electronically Signed   By: Marcello Moores  Register   On: 10/18/2016 14:54   Dg Chest Port 1 View  Result Date: 10/20/2016 CLINICAL DATA:  CHF, COPD, diabetes mellitus, hypertension, end-stage renal disease on dialysis with recent thoracentesis, former smoker, pleural effusion EXAM: PORTABLE CHEST 1 VIEW COMPARISON:  Portable exam 0709 hours compared to 10/18/2016 FINDINGS: Enlargement of cardiac silhouette. Atherosclerotic calcification aorta. Mediastinal contours and pulmonary vascularity normal. Mild bronchitic interstitial changes with atelectasis versus consolidation LEFT lower lobe. Remaining lungs clear. No definite pleural effusion or pneumothorax. IMPRESSION: Bronchitic changes with atelectasis versus consolidation in LEFT lower lobe. Compared to the previous exam, improved LEFT basilar aeration is seen. Aortic Atherosclerosis (ICD10-I70.0). Electronically Signed   By: Lavonia Dana M.D.   On: 10/20/2016 08:19   US Thoracentesis Asp Pleural Space W/img Guide  Result Date: 10/18/2016 INDICATION: Congestive heart failure. Recurrent right pleural effusion. Request therapeutic thoracentesis. EXAM: ULTRASOUND GUIDED RIGHT THORACENTESIS MEDICATIONS: None. COMPLICATIONS: None immediate. Postprocedural chest x-ray negative for pneumothorax. PROCEDURE: An ultrasound guided thoracentesis was thoroughly discussed with the patient and questions answered. The benefits, risks, alternatives and complications were also discussed. The patient understands and wishes to proceed with the procedure. Written consent was obtained. Ultrasound was performed to localize and mark an adequate pocket of fluid in the right chest.  The area was then prepped and draped in  the normal sterile fashion. 1% Lidocaine was used for local anesthesia. Under ultrasound guidance a Safe-T-Centesis catheter was introduced. Thoracentesis was performed. The catheter was removed and a dressing applied. FINDINGS: A total of approximately 1.5 L of clear yellow fluid was removed. IMPRESSION: Successful ultrasound guided right thoracentesis yielding 1.5 L of pleural fluid. Read by: Ascencion Dike PA-C Electronically Signed   By: Jerilynn Mages.  Shick M.D.   On: 10/18/2016 14:55      Management plans discussed with the patient, family and they are in agreement.  CODE STATUS:     Code Status Orders        Start     Ordered   10/16/16 2342  Full code  Continuous     10/16/16 2341      TOTAL TIME TAKING CARE OF THIS PATIENT: 40 minutes.    Henreitta Leber M.D on 10/20/2016 at 2:27 PM  Between 7am to 6pm - Pager - [phone]  After 6pm go to [email or web address] - [employer] [facility]  Office  [phone]  CC: Primary care physician; [name], MD

## 2016-10-20 NOTE — Progress Notes (Signed)
HD STARTED  

## 2016-10-20 NOTE — Progress Notes (Signed)
[facility]  ROUNDING NOTE   Subjective:  Hospitalist has requested additional dialysis today. Overall shortness of breath has improved however.    Objective:  Vital signs in last 24 hours:  Temp:  [98.1 F (36.7 C)-98.4 F (36.9 C)] 98.4 F (36.9 C) (07/22 0925) Pulse Rate:  [73-92] 92 (07/22 0925) Resp:  [13-27] 18 (07/22 0925) BP: (143-177)/(44-67) 164/51 (07/22 0925) SpO2:  [92 %-98 %] 98 % (07/22 0423) FiO2 (%):  [40 %] 40 % (07/21 2334) Weight:  [101.3 kg (223 lb 5.2 oz)] 101.3 kg (223 lb 5.2 oz) (07/21 1450)  Weight change:  Filed Weights   10/16/16 1940 10/17/16 0002 10/19/16 1450  Weight: 104.3 kg (230 lb) 102.9 kg (226 lb 14.4 oz) 101.3 kg (223 lb 5.2 oz)    Intake/Output: I/O last 3 completed shifts: In: 240 [P.O.:240] Out: 4000 [Other:4000]   Intake/Output this shift:  Total I/O In: 480 [P.O.:480] Out: -   Physical Exam: General: No acute distress  Head: Normocephalic, atraumatic. Moist oral mucosal membranes  Eyes: Anicteric  Neck: Supple, trachea midline  Lungs:  Diminished breath sounds at the right base, normal effort   Heart: S1S2 no rubs  Abdomen:  Soft, nontender, bowel sounds present  Extremities: trace peripheral edema.  Neurologic: Awake, alert, following commands  Skin: No lesions  Access: Left upper extremity AV fistula     Basic Metabolic Panel:  Recent Labs Lab 10/16/16 1942 10/17/16 0006 10/18/16 1725 10/19/16 1630  NA 137 138  --   --   K 2.9* 3.3*  --   --   CL 99* 99*  --   --   CO2 30 31  --   --   GLUCOSE 157* 182*  --   --   BUN 13 17  --   --   CREATININE 2.38* 2.84*  --   --   CALCIUM 8.6* 8.9  --   --   PHOS  --   --  2.3* 1.7*    Liver Function Tests:  Recent Labs Lab 10/16/16 1942  AST 20  ALT 12*  ALKPHOS 43  BILITOT 0.6  PROT 6.5  ALBUMIN 3.2*   No results for input(s): LIPASE, AMYLASE in the last 168 hours. No results for input(s): AMMONIA in the last 168 hours.  CBC:  Recent  Labs Lab 10/16/16 1942 10/17/16 0006  WBC 6.6 7.1  NEUTROABS 4.4  --   HGB 9.4* 10.3*  HCT 27.4* 30.4*  MCV 87.0 86.4  PLT 152 168    Cardiac Enzymes:  Recent Labs Lab 10/16/16 1942  TROPONINI <0.03    BNP: Invalid input(s): POCBNP  CBG:  Recent Labs Lab 10/19/16 1145 10/19/16 1844 10/19/16 2113 10/20/16 0758 10/20/16 1146  GLUCAP 183* 90 198* 105* 69*    Microbiology: Results for orders placed or performed during the hospital encounter of 10/16/16  Urine culture     Status: Abnormal   Collection Time: 10/17/16  6:43 AM  Result Value Ref Range Status   Specimen Description URINE, RANDOM  Final   Special Requests NONE  Final   Culture (A)  Final    <10,000 COLONIES/mL INSIGNIFICANT GROWTH Performed at [facility], [place] [address], [place], [place] [address]    Report Status 10/18/2016 FINAL  Final  MRSA PCR Screening     Status: None   Collection Time: 10/17/16  6:36 PM  Result Value Ref Range Status   MRSA by PCR NEGATIVE NEGATIVE Final  Comment:        The GeneXpert MRSA Assay (FDA approved for NASAL specimens only), is one component of a comprehensive MRSA colonization surveillance program. It is not intended to diagnose MRSA infection nor to guide or monitor treatment for MRSA infections.     Coagulation Studies: No results for input(s): LABPROT, INR in the last 72 hours.  Urinalysis: No results for input(s): COLORURINE, LABSPEC, PHURINE, GLUCOSEU, HGBUR, BILIRUBINUR, KETONESUR, PROTEINUR, UROBILINOGEN, NITRITE, LEUKOCYTESUR in the last 72 hours.  Invalid input(s): APPERANCEUR    Imaging: Dg Chest 1 View  Result Date: 10/18/2016 CLINICAL DATA:  Post right-sided thoracentesis. EXAM: CHEST 1 VIEW COMPARISON:  CT 10/17/2016.  Chest x-ray 10/16/2016 . FINDINGS: Cardiomegaly with mild bilateral interstitial prominence and small left pleural effusion. Findings suggest mild CHF. Improvement from prior exam. No significant right pleural  effusion. No pneumothorax post thoracentesis. IMPRESSION: 1.  No pneumothorax post thoracentesis. 2. Mild CHF . Electronically Signed   By: Marcello Moores  Register   On: 10/18/2016 14:54   Dg Chest Port 1 View  Result Date: 10/20/2016 CLINICAL DATA:  CHF, COPD, diabetes mellitus, hypertension, end-stage renal disease on dialysis with recent thoracentesis, former smoker, pleural effusion EXAM: PORTABLE CHEST 1 VIEW COMPARISON:  Portable exam 0709 hours compared to 10/18/2016 FINDINGS: Enlargement of cardiac silhouette. Atherosclerotic calcification aorta. Mediastinal contours and pulmonary vascularity normal. Mild bronchitic interstitial changes with atelectasis versus consolidation LEFT lower lobe. Remaining lungs clear. No definite pleural effusion or pneumothorax. IMPRESSION: Bronchitic changes with atelectasis versus consolidation in LEFT lower lobe. Compared to the previous exam, improved LEFT basilar aeration is seen. Aortic Atherosclerosis (ICD10-I70.0). Electronically Signed   By: Lavonia Dana M.D.   On: 10/20/2016 08:19   US Thoracentesis Asp Pleural Space W/img Guide  Result Date: 10/18/2016 INDICATION: Congestive heart failure. Recurrent right pleural effusion. Request therapeutic thoracentesis. EXAM: ULTRASOUND GUIDED RIGHT THORACENTESIS MEDICATIONS: None. COMPLICATIONS: None immediate. Postprocedural chest x-ray negative for pneumothorax. PROCEDURE: An ultrasound guided thoracentesis was thoroughly discussed with the patient and questions answered. The benefits, risks, alternatives and complications were also discussed. The patient understands and wishes to proceed with the procedure. Written consent was obtained. Ultrasound was performed to localize and mark an adequate pocket of fluid in the right chest. The area was then prepped and draped in the normal sterile fashion. 1% Lidocaine was used for local anesthesia. Under ultrasound guidance a Safe-T-Centesis catheter was introduced. Thoracentesis was  performed. The catheter was removed and a dressing applied. FINDINGS: A total of approximately 1.5 L of clear yellow fluid was removed. IMPRESSION: Successful ultrasound guided right thoracentesis yielding 1.5 L of pleural fluid. Read by: Ascencion Dike PA-C Electronically Signed   By: Jerilynn Mages.  Shick M.D.   On: 10/18/2016 14:55     Medications:    . aspirin  81 mg Oral Daily  . atorvastatin  40 mg Oral Daily  . calcium acetate  1,334 mg Oral TID WC  . carvedilol  6.25 mg Oral BID  . doxazosin  8 mg Oral Daily  . furosemide  40 mg Oral Daily  . heparin  5,000 Units Subcutaneous Q8H  . insulin aspart  0-9 Units Subcutaneous TID WC  . insulin detemir  30 Units Subcutaneous BID AC & HS  . ipratropium-albuterol  3 mL Nebulization BID  . isosorbide mononitrate  30 mg Oral Daily  . lisinopril  20 mg Oral Daily  . mouth rinse  15 mL Mouth Rinse BID  . pantoprazole  40 mg Oral Daily  . spironolactone  25 mg Oral Daily   acetaminophen, docusate sodium, ondansetron, zolpidem  Assessment/ Plan:  76 y.o. caucasian male with diabetes mellitus type II insulin dependent, hypertension, coronary artery disease, hyperlipidemia, prostate cancer status post prostatectomy, right total knee, appendectomy.   MWF [facility]  1. End Stage Renal Disease MWF: - Hospitalist has requested another dialysis session today for additional so have dialysis tomorrow as an outpatient.  2.  Anemia of chronic kidney disease: Continue to monitor CBC as an outpatient.  3.  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  - Management as per hospitalist.  4. Secondary Hyperparathyroidism:   - Phosphorus was a bit low however the patient has had multiple days of dialysis which can bring down the serum phosphorus. Continue to monitor as an outpatient.  5. Acute respiratory failure:  Patient has significantly improved in terms of his respiratory status. We will continue to  monitor.    LOS: 4 Calvin Good 7/22/20182:26 PM

## 2016-10-20 NOTE — Progress Notes (Signed)
Patient returned from dialysis alert and oriented, [place] sandwich tray given. Patient stated he was ready to go home. Daughter notified of discharge. Discharge instructions reviewed with patient and daughter. Questions were encouraged and answered. Prescription for levaquin given. Patient wheeled down to visitors entrance by this RN. Terrial Rhodes

## 2016-10-20 NOTE — Progress Notes (Signed)
Post dialysis assessment 

## 2016-10-20 NOTE — Progress Notes (Signed)
This note also relates to the following rows which could not be included: Pulse Rate - Cannot attach notes to unvalidated device data Resp - Cannot attach notes to unvalidated device data BP - Cannot attach notes to unvalidated device data  Hd completed  

## 2016-10-22 ENCOUNTER — Ambulatory Visit
Admission: RE | Admit: 2016-10-22 | Discharge: 2016-10-22 | Disposition: A | Discharge status: Home / Self Care | Payer: Medicare Other | Source: Ambulatory Visit | Attending: Internal Medicine | Admitting: Internal Medicine

## 2016-10-22 DIAGNOSIS — I517 Cardiomegaly: Secondary | ICD-10-CM | POA: Diagnosis not present

## 2016-10-22 DIAGNOSIS — J439 Emphysema, unspecified: Secondary | ICD-10-CM | POA: Insufficient documentation

## 2016-10-22 DIAGNOSIS — R590 Localized enlarged lymph nodes: Secondary | ICD-10-CM | POA: Diagnosis not present

## 2016-10-22 DIAGNOSIS — Z9079 Acquired absence of other genital organ(s): Secondary | ICD-10-CM | POA: Insufficient documentation

## 2016-10-22 DIAGNOSIS — K8020 Calculus of gallbladder without cholecystitis without obstruction: Secondary | ICD-10-CM | POA: Insufficient documentation

## 2016-10-22 DIAGNOSIS — I700 Atherosclerosis of aorta: Secondary | ICD-10-CM | POA: Diagnosis not present

## 2016-10-22 DIAGNOSIS — J9611 Chronic respiratory failure with hypoxia: Secondary | ICD-10-CM | POA: Insufficient documentation

## 2016-10-22 DIAGNOSIS — R5381 Other malaise: Secondary | ICD-10-CM

## 2016-10-22 DIAGNOSIS — Z8546 Personal history of malignant neoplasm of prostate: Secondary | ICD-10-CM

## 2016-10-22 DIAGNOSIS — R531 Weakness: Secondary | ICD-10-CM

## 2016-10-22 LAB — GLUCOSE, CAPILLARY: Glucose-Capillary: 181 mg/dL — ABNORMAL HIGH (ref 65–99)

## 2016-10-22 MED ORDER — FLUDEOXYGLUCOSE F - 18 (FDG) INJECTION
12.0000 | Freq: Once | INTRAVENOUS | Status: AC | PRN
  Administered 2016-10-22: 12.03 via INTRAVENOUS

## 2016-10-29 ENCOUNTER — Ambulatory Visit (INDEPENDENT_AMBULATORY_CARE_PROVIDER_SITE_OTHER): Payer: Medicare Other | Admitting: Internal Medicine

## 2016-10-29 ENCOUNTER — Encounter: Payer: Self-pay | Admitting: Internal Medicine

## 2016-10-29 VITALS — BP 152/78 | HR 84 | Resp 16 | Ht 69.0 in | Wt 219.0 lb

## 2016-10-29 DIAGNOSIS — R5382 Chronic fatigue, unspecified: Secondary | ICD-10-CM | POA: Insufficient documentation

## 2016-10-29 DIAGNOSIS — G9332 Myalgic encephalomyelitis/chronic fatigue syndrome: Secondary | ICD-10-CM | POA: Insufficient documentation

## 2016-10-29 DIAGNOSIS — Z992 Dependence on renal dialysis: Secondary | ICD-10-CM | POA: Insufficient documentation

## 2016-10-29 DIAGNOSIS — Z794 Long term (current) use of insulin: Secondary | ICD-10-CM | POA: Insufficient documentation

## 2016-10-29 DIAGNOSIS — E1165 Type 2 diabetes mellitus with hyperglycemia: Secondary | ICD-10-CM | POA: Insufficient documentation

## 2016-10-29 DIAGNOSIS — N3942 Incontinence without sensory awareness: Secondary | ICD-10-CM | POA: Insufficient documentation

## 2016-10-29 DIAGNOSIS — J90 Pleural effusion, not elsewhere classified: Secondary | ICD-10-CM

## 2016-10-29 DIAGNOSIS — E1140 Type 2 diabetes mellitus with diabetic neuropathy, unspecified: Secondary | ICD-10-CM | POA: Insufficient documentation

## 2016-10-29 DIAGNOSIS — J449 Chronic obstructive pulmonary disease, unspecified: Secondary | ICD-10-CM | POA: Insufficient documentation

## 2016-10-29 DIAGNOSIS — I509 Heart failure, unspecified: Secondary | ICD-10-CM | POA: Insufficient documentation

## 2016-10-29 DIAGNOSIS — E1121 Type 2 diabetes mellitus with diabetic nephropathy: Secondary | ICD-10-CM | POA: Insufficient documentation

## 2016-10-29 DIAGNOSIS — R32 Unspecified urinary incontinence: Secondary | ICD-10-CM | POA: Insufficient documentation

## 2016-10-29 DIAGNOSIS — G4733 Obstructive sleep apnea (adult) (pediatric): Secondary | ICD-10-CM | POA: Insufficient documentation

## 2016-10-29 DIAGNOSIS — J301 Allergic rhinitis due to pollen: Secondary | ICD-10-CM | POA: Insufficient documentation

## 2016-10-29 NOTE — Progress Notes (Signed)
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     HISTORY OF PRESENT ILLNESS:   76 year old pleasant white male resonance today with follow-up for recent hospitalizations His first hospitalization in May resulted in chest pain status post cardiac catheter found to have severe CAD which is managed medically His next hospitalization resulted in bleeding from his groin site status post catheter His next 2 admissions in the hospital where for shortness of breath and respirator distress patient was placed on BiPAP and placed in the stepdown unit and subsequently obtained several ultrasound-guided thoracentesis and removed several liters several liters of fluid  Patient presents today with chronic shortness of breath chronic dyspnea exertion but no acute distress at this time No signs of infection at this time No cough but does have shortness of breath and dyspnea on exertion Patient denies any wheezing Patient has smoked 3-4 packs a day for 30 years Patient has end-stage renal disease on dialysis and started last year Patient has lower extremity edema  Patient states that he has congestive heart failure and sees Dr. Jerrye Beavers  PAST MEDICAL HISTORY :   has a past medical history of Cancer ([place]); CHF (congestive heart failure) ([place]); Chronic kidney disease; Complication of anesthesia; COPD (chronic obstructive pulmonary disease) ([place]); Diabetes mellitus without complication ([place]); Difficult intubation; Dyspnea; GERD (gastroesophageal reflux disease); Hyperlipidemia; Hypertension; Pleural effusion; and Renal insufficiency.  has a past surgical history that includes postate removal ;  Appendectomy; Prostate surgery; Joint replacement; Dialysis/Perma Catheter Insertion; AV fistula placement (Left, 05/30/2016); CAPD insertion (N/A, 05/30/2016); Removal of a dialysis catheter (N/A, 08/08/2016); Dialysis/Perma Catheter Removal (N/A, 08/21/2016); Left Heart Cath and Coronary Angiography (N/A, 08/21/2016); and Pseudoanerysm Compression (Right, 08/27/2016). Prior to Admission medications   Medication Sig Start Date End Date Taking? Authorizing Provider  acetaminophen (TYLENOL) 325 MG tablet Take 2 tablets (650 mg total) by mouth every 6 (six) hours as needed for mild pain (or Fever >/= 101). 09/23/16  Yes Gouru, Illene Silver, MD  amLODipine (NORVASC) 5 MG tablet Take 5 mg by mouth at bedtime.    Yes [provider]  aspirin 81 MG chewable tablet Chew 81 mg by mouth daily. 8 am   Yes [provider]  atorvastatin (LIPITOR) 40 MG tablet Take 40 mg by mouth daily. 8 pm   Yes [provider]  calcium acetate (PHOSLO) 667 MG capsule Take 1,334 mg by mouth 3 (three) times daily with meals. 2 caps at 8 am, 12 pm, 5 pm   Yes [provider]  carvedilol (COREG) 6.25 MG tablet Take 6.25 mg by mouth 2 (two) times daily. 8 am, 5 pm 09/05/16  Yes [provider]  dextrose (GLUTOSE) 40 % GEL Take 1 Tube by mouth every 30 (thirty) minutes as needed for low blood sugar. Or until BS > 70 and pt is still responsive.   Yes [provider]  doxazosin (CARDURA) 8 MG tablet Take 8 mg by mouth daily.    Yes [provider]  furosemide (LASIX) 40 MG tablet Take 40 mg by mouth daily. 8 am   Yes [provider]  glucagon (GLUCAGON EMERGENCY) 1 MG injection Inject 1 mg into the muscle once as needed. If not responding  to oral glucose, snack. May repeat x 1 after 15 minutes. Use only if pt is or is becoming unresponsive as needed.   Yes [provider]  insulin aspart (NOVOLOG) 100 UNIT/ML injection Inject 0-9 Units into the skin 3 (three) times daily with  meals. CBG < 70: implement hypoglycemia protocol CBG 70 - 120: 0 units CBG 121 - 150: 1 unit CBG 151 - 200: 2 units CBG 201 - 250: 3 units CBG 251 - 300: 5 units CBG 301 - 350: 7 units CBG 351 - 400: 9 units CBG > 400: call MD and obtain STAT lab verification Patient taking differently: Inject 10 Units into the skin 3 (three) times daily with meals. CBG < 70: implement hypoglycemia protocol CBG 70 - 120: 0 units CBG 121 - 150: 1 unit CBG 151 - 200: 2 units CBG 201 - 250: 3 units CBG 251 - 300: 5 units CBG 301 - 350: 7 units CBG 351 - 400: 9 units CBG > 400: call MD and obtain STAT lab verification 09/23/16  Yes Gouru, Aruna, MD  ipratropium-albuterol (DUONEB) 0.5-2.5 (3) MG/3ML SOLN Take 3 mLs by nebulization every 6 (six) hours as needed. 09/23/16  Yes Gouru, Illene Silver, MD  isosorbide mononitrate (IMDUR) 30 MG 24 hr tablet Take 1 tablet (30 mg total) by mouth daily. 08/22/16  Yes Dustin Flock, MD  levofloxacin (LEVAQUIN) 250 MG tablet Take 1 tablet (250 mg total) by mouth every other day. 10/20/16 10/29/16 Yes Sainani, Belia Heman, MD  Lidocaine-Prilocaine, Bulk, 2.5-2.5 % CREA Apply 1 application topically as needed. Apply to fistula site prior to dialysis treatments as needed   Yes [provider]  lisinopril (PRINIVIL,ZESTRIL) 20 MG tablet Take 20 mg by mouth daily. 8 am   Yes [provider]  omeprazole (PRILOSEC) 20 MG capsule Take 20 mg by mouth daily.    Yes [provider]  ondansetron (ZOFRAN) 4 MG tablet Take 1 tablet (4 mg total) by mouth every 6 (six) hours as needed for nausea. 09/23/16  Yes Gouru, Illene Silver, MD  OXYGEN Inhale 2 L into the lungs continuous.   Yes [provider]  spironolactone (ALDACTONE) 25 MG tablet Take 25 mg by mouth daily. 8 am   Yes [provider]   Allergies  Allergen Reactions  . Tetracyclines & Related Other (See Comments)    Reaction: unknown happened a long time ago and pt doesn't remember reaction   . Morphine  Other (See Comments)    Other reaction(s): disoriented Reaction: confusion    FAMILY HISTORY:  family history includes Brain cancer in his mother; Colon cancer in his father. SOCIAL HISTORY:  reports that he quit smoking about 34 years ago. He has never used smokeless tobacco. He reports that he drinks about 1.8 oz of alcohol per week . He reports that he does not use drugs.  REVIEW OF SYSTEMS:   Constitutional: Negative for fever, chills, weight loss, +malaise/fatigue and diaphoresis.  HENT: Negative for hearing loss, ear pain, nosebleeds, congestion, sore throat, neck pain, tinnitus and ear discharge.   Eyes: Negative for blurred vision, double vision, photophobia, pain, discharge and redness.  Respiratory: Negative for cough, hemoptysis, sputum production, +shortness of breath, -wheezing and stridor.   Cardiovascular: Negative for chest pain, palpitations, orthopnea, claudication, leg swelling and PND.  Gastrointestinal: Negative for heartburn, nausea, vomiting, abdominal pain, diarrhea, constipation, blood in stool and melena.  Genitourinary: Negative for dysuria, urgency, frequency, hematuria and flank pain.  Musculoskeletal: Negative for myalgias, back pain,  joint pain and falls.  Skin: Negative for itching and rash.  Neurological: Negative for dizziness, tingling, tremors, sensory change, speech change, focal weakness, seizures, loss of consciousness, weakness and headaches.  Endo/Heme/Allergies: Negative for environmental allergies and polydipsia. Does not bruise/bleed easily.  ALL OTHER ROS ARE NEGATIVE   BP (!) 152/78 (BP Location: Left Arm, Cuff Size: Normal)   Pulse 84   Resp 16   Ht 5\' 9"  (1.753 m)   Wt 219 lb (99.3 kg)   SpO2 90%   BMI 32.34 kg/m   Physical Examination:   GENERAL:NAD, no fevers, chills, no weakness no fatigue HEAD: Normocephalic, atraumatic.  EYES: Pupils equal, round, reactive to light. Extraocular muscles intact. No scleral icterus.  MOUTH:  Moist mucosal membrane.   EAR, NOSE, THROAT: Clear without exudates. No external lesions.  NECK: Supple. No thyromegaly. No nodules. No JVD.  PULMONARY:CTA B/L decreased breath sounds bilateral CARDIOVASCULAR: S1 and S2. Regular rate and rhythm. No murmurs, rubs, or gallops. + edema.  GASTROINTESTINAL: Soft, nontender, nondistended. No masses. Positive bowel sounds.  MUSCULOSKELETAL: No swelling, clubbing, or edema. Range of motion full in all extremities.  NEUROLOGIC: Cranial nerves II through XII are intact. No gross focal neurological deficits.  SKIN: No ulceration, lesions, rashes, or cyanosis. Skin warm and dry. Turgor intact.  PSYCHIATRIC: Mood, affect within normal limits. The patient is awake, alert and oriented x 3. Insight, judgment intact.      ASSESSMENT / PLAN: 76 year old pleasant white male with end-stage renal disease and congestive heart failure with recurrent bilateral pleural effusions and at this time has chronic hypoxic respiratory failure due to his multiple organ dysfunction At this time patient will need oxygen therapy and he will need to follow-up with cardiology for optimization of medications I would also recommend following up with nephrology to optimize hemodialysis I would recommend removal of maximum amount of fluid during each dialysis  I do anticipate that patient will have recurrent pleural effusions from his underlying multiorgan dysfunction  At this time his COPD seems to be under control without any inhalers at this time and most of his respiratory issues are from his bilateral pleural effusions end-stage renal disease and his ischemic cardiomyopathy  #1 follow-up with cardiology #2 follow-up nephrology I will discuss with nephrologist to maximize and optimize removal of fluid #3 continue oxygen as needed   Patient/Family are satisfied with Plan of action and management. All questions answered  Corrin Parker, M.D.  [facility]  Medical Director [place] Director [facility] Cardio-Pulmonary Department

## 2016-10-29 NOTE — Patient Instructions (Signed)
discuss with nephrology about optimization of hemodialysis Continue oxygen as needed Follow-up cardiology recommendations

## 2016-10-30 ENCOUNTER — Encounter: Payer: Self-pay | Admitting: Emergency Medicine

## 2016-10-30 ENCOUNTER — Inpatient Hospital Stay
Admission: EM | Admit: 2016-10-30 | Discharge: 2016-10-31 | DRG: 308 | Disposition: A | Discharge status: Home Health | Payer: Medicare Other | Attending: Internal Medicine | Admitting: Internal Medicine

## 2016-10-30 DIAGNOSIS — I2510 Atherosclerotic heart disease of native coronary artery without angina pectoris: Secondary | ICD-10-CM | POA: Diagnosis present

## 2016-10-30 DIAGNOSIS — R748 Abnormal levels of other serum enzymes: Secondary | ICD-10-CM | POA: Diagnosis present

## 2016-10-30 DIAGNOSIS — R7989 Other specified abnormal findings of blood chemistry: Secondary | ICD-10-CM

## 2016-10-30 DIAGNOSIS — Z6831 Body mass index (BMI) 31.0-31.9, adult: Secondary | ICD-10-CM | POA: Diagnosis not present

## 2016-10-30 DIAGNOSIS — I4891 Unspecified atrial fibrillation: Principal | ICD-10-CM | POA: Diagnosis present

## 2016-10-30 DIAGNOSIS — E669 Obesity, unspecified: Secondary | ICD-10-CM | POA: Diagnosis not present

## 2016-10-30 DIAGNOSIS — Z992 Dependence on renal dialysis: Secondary | ICD-10-CM

## 2016-10-30 DIAGNOSIS — Z9981 Dependence on supplemental oxygen: Secondary | ICD-10-CM

## 2016-10-30 DIAGNOSIS — E1122 Type 2 diabetes mellitus with diabetic chronic kidney disease: Secondary | ICD-10-CM | POA: Diagnosis present

## 2016-10-30 DIAGNOSIS — Z87891 Personal history of nicotine dependence: Secondary | ICD-10-CM

## 2016-10-30 DIAGNOSIS — J449 Chronic obstructive pulmonary disease, unspecified: Secondary | ICD-10-CM | POA: Diagnosis not present

## 2016-10-30 DIAGNOSIS — I5022 Chronic systolic (congestive) heart failure: Secondary | ICD-10-CM | POA: Diagnosis not present

## 2016-10-30 DIAGNOSIS — D631 Anemia in chronic kidney disease: Secondary | ICD-10-CM | POA: Diagnosis present

## 2016-10-30 DIAGNOSIS — I248 Other forms of acute ischemic heart disease: Secondary | ICD-10-CM | POA: Diagnosis present

## 2016-10-30 DIAGNOSIS — Z96651 Presence of right artificial knee joint: Secondary | ICD-10-CM | POA: Diagnosis present

## 2016-10-30 DIAGNOSIS — N2581 Secondary hyperparathyroidism of renal origin: Secondary | ICD-10-CM | POA: Diagnosis present

## 2016-10-30 DIAGNOSIS — Z808 Family history of malignant neoplasm of other organs or systems: Secondary | ICD-10-CM | POA: Diagnosis not present

## 2016-10-30 DIAGNOSIS — I132 Hypertensive heart and chronic kidney disease with heart failure and with stage 5 chronic kidney disease, or end stage renal disease: Secondary | ICD-10-CM | POA: Diagnosis present

## 2016-10-30 DIAGNOSIS — E785 Hyperlipidemia, unspecified: Secondary | ICD-10-CM | POA: Diagnosis not present

## 2016-10-30 DIAGNOSIS — K219 Gastro-esophageal reflux disease without esophagitis: Secondary | ICD-10-CM | POA: Diagnosis not present

## 2016-10-30 DIAGNOSIS — Z7982 Long term (current) use of aspirin: Secondary | ICD-10-CM | POA: Diagnosis not present

## 2016-10-30 DIAGNOSIS — Z8546 Personal history of malignant neoplasm of prostate: Secondary | ICD-10-CM

## 2016-10-30 DIAGNOSIS — N186 End stage renal disease: Secondary | ICD-10-CM | POA: Diagnosis present

## 2016-10-30 DIAGNOSIS — R778 Other specified abnormalities of plasma proteins: Secondary | ICD-10-CM

## 2016-10-30 DIAGNOSIS — Z800 Family history of malignant neoplasm of digestive organs: Secondary | ICD-10-CM

## 2016-10-30 DIAGNOSIS — I429 Cardiomyopathy, unspecified: Secondary | ICD-10-CM | POA: Diagnosis present

## 2016-10-30 DIAGNOSIS — I4510 Unspecified right bundle-branch block: Secondary | ICD-10-CM | POA: Diagnosis present

## 2016-10-30 DIAGNOSIS — Z79899 Other long term (current) drug therapy: Secondary | ICD-10-CM

## 2016-10-30 LAB — CBC WITH DIFFERENTIAL/PLATELET
Basophils Absolute: 0 10*3/uL (ref 0–0.1)
Basophils Relative: 0 %
Eosinophils Absolute: 0.2 10*3/uL (ref 0–0.7)
Eosinophils Relative: 2 %
HCT: 29.9 % — ABNORMAL LOW (ref 40.0–52.0)
Hemoglobin: 10.3 g/dL — ABNORMAL LOW (ref 13.0–18.0)
LYMPHS ABS: 1.3 10*3/uL (ref 1.0–3.6)
LYMPHS PCT: 13 %
MCH: 29.4 pg (ref 26.0–34.0)
MCHC: 34.5 g/dL (ref 32.0–36.0)
MCV: 85.3 fL (ref 80.0–100.0)
MONO ABS: 0.6 10*3/uL (ref 0.2–1.0)
MONOS PCT: 6 %
Neutro Abs: 7.9 10*3/uL — ABNORMAL HIGH (ref 1.4–6.5)
Neutrophils Relative %: 79 %
Platelets: 151 10*3/uL (ref 150–440)
RBC: 3.5 MIL/uL — ABNORMAL LOW (ref 4.40–5.90)
RDW: 14.8 % — AB (ref 11.5–14.5)
WBC: 10 10*3/uL (ref 3.8–10.6)

## 2016-10-30 LAB — BASIC METABOLIC PANEL
Anion gap: 8 (ref 5–15)
BUN: 17 mg/dL (ref 6–20)
CALCIUM: 8.3 mg/dL — AB (ref 8.9–10.3)
CO2: 29 mmol/L (ref 22–32)
Chloride: 97 mmol/L — ABNORMAL LOW (ref 101–111)
Creatinine, Ser: 2.66 mg/dL — ABNORMAL HIGH (ref 0.61–1.24)
GFR calc Af Amer: 25 mL/min — ABNORMAL LOW (ref >60)
GFR, EST NON AFRICAN AMERICAN: 21 mL/min — AB (ref >60)
GLUCOSE: 338 mg/dL — AB (ref 65–99)
Potassium: 2.9 mmol/L — ABNORMAL LOW (ref 3.5–5.1)
Sodium: 134 mmol/L — ABNORMAL LOW (ref 135–145)

## 2016-10-30 LAB — TROPONIN I: Troponin I: 0.05 ng/mL (ref <0.03)

## 2016-10-30 MED ORDER — HEPARIN SODIUM (PORCINE) 5000 UNIT/ML IJ SOLN: 5000.0000 [IU] | Freq: Three times a day (TID) | INTRAMUSCULAR | Status: DC

## 2016-10-30 MED ORDER — ASPIRIN 81 MG PO CHEW
324.0000 mg | CHEWABLE_TABLET | Freq: Once | ORAL | Status: DC
  Filled 2016-10-30: qty 4

## 2016-10-30 MED ORDER — POTASSIUM CHLORIDE CRYS ER 20 MEQ PO TBCR
40.0000 meq | EXTENDED_RELEASE_TABLET | Freq: Once | ORAL | Status: AC
  Administered 2016-10-31: 40 meq via ORAL
  Filled 2016-10-30: qty 2

## 2016-10-30 MED ORDER — METOPROLOL TARTRATE 5 MG/5ML IV SOLN
5.0000 mg | Freq: Once | INTRAVENOUS | Status: AC
  Administered 2016-10-30: 5 mg via INTRAVENOUS
  Filled 2016-10-30: qty 5

## 2016-10-30 NOTE — ED Notes (Signed)
Patient states he feels a lot better

## 2016-10-30 NOTE — ED Triage Notes (Signed)
Patient comes in from home via [facility] with generalized weakness and over all not feeling good. Patient is dialysis patient and they increased the amount today due to swelling in hi legs. Patient ever since has not felt well. Patient denies any pain. NAD. Per EMS 139/60 hr 168-140's. EMS gave Zofran 4mg  in route for nausea. No vomiting.

## 2016-10-30 NOTE — ED Provider Notes (Signed)
[facility] Emergency Department Provider Note   ____________________________________________   I have reviewed the triage vital signs and the nursing notes.   HISTORY  Chief Complaint Weakness and Nausea   History limited by: Not Limited   HPI Calvin Good is a 76 y.o. male who presents to the emergency department today because of concerns for nausea and weakness. The patient states that the symptoms started after dialysis. He states he took off more than normal today because of some swelling around his feet. States ever since then he has been feeling nauseous. He states he's been feeling weak. He is having a hard time walking. He has not had any associated chest pain or palpitations. He denies any recent fevers.   Past Medical History:  Diagnosis Date  . Cancer [facility])    Prostate  . CHF (congestive heart failure) ([place])   . Chronic kidney disease   . Complication of anesthesia    hard to wake up  . COPD (chronic obstructive pulmonary disease) ([place])   . Diabetes mellitus without complication ([place])   . Difficult intubation   . Dyspnea   . GERD (gastroesophageal reflux disease)   . Hyperlipidemia   . Hypertension   . Pleural effusion   . Renal insufficiency     Patient Active Problem List   Diagnosis Date Noted  . Allergic rhinitis due to pollen 10/29/2016  . Chronic airway obstruction ([place]) 10/29/2016  . Chronic fatigue syndrome 10/29/2016  . Dependence on renal dialysis ([place]) 10/29/2016  . Diabetic neuropathy ([place]) 10/29/2016  . Diabetic renal disease ([place]) 10/29/2016  . Heart failure ([place]) 10/29/2016  . Hyperglycemia due to type 2 diabetes mellitus ([place]) 10/29/2016  . Incontinence without sensory awareness 10/29/2016  . Long term current use of insulin ([place]) 10/29/2016  . Obstructive sleep apnea 10/29/2016  . Urinary incontinence 10/29/2016  . Acute on chronic respiratory failure ([place]) 10/16/2016  . Anxiety 10/10/2016  . Acute  encephalopathy   . HCAP (healthcare-associated pneumonia) 09/14/2016  . Tobacco use 09/03/2016  . Aneurysm ([place]) 08/28/2016  . Pseudoaneurysm following procedure ([place]) 08/26/2016  . Chest tightness 08/20/2016  . GERD without esophagitis 08/20/2016  . NSTEMI (non-ST elevated myocardial infarction) ([place]) 08/20/2016  . Cancer of prostate ([place]) 05/16/2016  . Chronic diastolic CHF (congestive heart failure) ([place]) 05/16/2016  . Diabetic polyneuropathy associated with type 2 diabetes mellitus ([place]) 05/16/2016  . Onychomycosis of toenail 05/13/2016  . Essential hypertension, benign 04/23/2016  . Hyperlipidemia 04/23/2016  . Type 2 diabetes mellitus with chronic kidney disease on chronic dialysis, with long-term current use of insulin ([place]) 04/23/2016  . ESRD (end stage renal disease) ([place]) 04/23/2016    Past Surgical History:  Procedure Laterality Date  . APPENDECTOMY    . AV FISTULA PLACEMENT Left 05/30/2016   Procedure: ARTERIOVENOUS (AV) FISTULA CREATION ( BRACHIOCEPHALIC );  Surgeon: [name], MD;  Location: [facility] ORS;  Service: Vascular;  Laterality: Left;  . CAPD INSERTION N/A 05/30/2016   Procedure: LAPAROSCOPIC INSERTION CONTINUOUS AMBULATORY PERITONEAL DIALYSIS  (CAPD) CATHETER;  Surgeon: [name], MD;  Location: [facility] ORS;  Service: Vascular;  Laterality: N/A;  . DIALYSIS/PERMA CATHETER INSERTION    . DIALYSIS/PERMA CATHETER REMOVAL N/A 08/21/2016   Procedure: Dialysis/Perma Catheter Removal;  Surgeon: [name], MD;  Location: [place] CV LAB;  Service: Cardiovascular;  Laterality: N/A;  . JOINT REPLACEMENT     right knee   . LEFT HEART CATH AND CORONARY ANGIOGRAPHY N/A 08/21/2016   Procedure: Left  Heart Cath and Coronary Angiography possible PCI;  Surgeon: [name], MD;  Location: [place] CV LAB;  Service: Cardiovascular;  Laterality: N/A;  . postate removal     . PROSTATE SURGERY    . PSEUDOANERYSM COMPRESSION Right 08/27/2016   Procedure: Pseudoanerysm  Compression;  Surgeon: Katha Cabal, MD;  Location: [place] CV LAB;  Service: Cardiovascular;  Laterality: Right;  . REMOVAL OF A DIALYSIS CATHETER N/A 08/08/2016   Procedure: REMOVAL OF A DIALYSIS CATHETER;  Surgeon: [name], MD;  Location: [facility] ORS;  Service: Vascular;  Laterality: N/A;    Prior to Admission medications   Medication Sig Start Date End Date Taking? Authorizing Provider  acetaminophen (TYLENOL) 325 MG tablet Take 2 tablets (650 mg total) by mouth every 6 (six) hours as needed for mild pain (or Fever >/= 101). 09/23/16   Gouru, Illene Silver, MD  amLODipine (NORVASC) 5 MG tablet Take 5 mg by mouth at bedtime.     [provider]  aspirin 81 MG chewable tablet Chew 81 mg by mouth daily. 8 am    [provider]  atorvastatin (LIPITOR) 40 MG tablet Take 40 mg by mouth daily. 8 pm    [provider]  calcium acetate (PHOSLO) 667 MG capsule Take 1,334 mg by mouth 3 (three) times daily with meals. 2 caps at 8 am, 12 pm, 5 pm    [provider]  carvedilol (COREG) 6.25 MG tablet Take 6.25 mg by mouth 2 (two) times daily. 8 am, 5 pm 09/05/16   [provider]  dextrose (GLUTOSE) 40 % GEL Take 1 Tube by mouth every 30 (thirty) minutes as needed for low blood sugar. Or until BS > 70 and pt is still responsive.    [provider]  doxazosin (CARDURA) 8 MG tablet Take 8 mg by mouth daily.     [provider]  furosemide (LASIX) 40 MG tablet Take 40 mg by mouth daily. 8 am    [provider]  glucagon (GLUCAGON EMERGENCY) 1 MG injection Inject 1 mg into the muscle once as needed. If not responding to oral glucose, snack. May repeat x 1 after 15 minutes. Use only if pt is or is becoming unresponsive as needed.    [provider]  insulin aspart (NOVOLOG) 100 UNIT/ML injection Inject 0-9 Units into the skin 3 (three) times daily with meals. CBG < 70: implement hypoglycemia protocol CBG 70 - 120: 0 units CBG 121  - 150: 1 unit CBG 151 - 200: 2 units CBG 201 - 250: 3 units CBG 251 - 300: 5 units CBG 301 - 350: 7 units CBG 351 - 400: 9 units CBG > 400: call MD and obtain STAT lab verification Patient taking differently: Inject 10 Units into the skin 3 (three) times daily with meals. CBG < 70: implement hypoglycemia protocol CBG 70 - 120: 0 units CBG 121 - 150: 1 unit CBG 151 - 200: 2 units CBG 201 - 250: 3 units CBG 251 - 300: 5 units CBG 301 - 350: 7 units CBG 351 - 400: 9 units CBG > 400: call MD and obtain STAT lab verification 09/23/16   Gouru, Aruna, MD  ipratropium-albuterol (DUONEB) 0.5-2.5 (3) MG/3ML SOLN Take 3 mLs by nebulization every 6 (six) hours as needed. 09/23/16   Nicholes Mango, MD  isosorbide mononitrate (IMDUR) 30 MG 24 hr tablet Take 1 tablet (30 mg total) by mouth daily. 08/22/16   Dustin Flock, MD  Lidocaine-Prilocaine,  Bulk, 2.5-2.5 % CREA Apply 1 application topically as needed. Apply to fistula site prior to dialysis treatments as needed    [provider]  lisinopril (PRINIVIL,ZESTRIL) 20 MG tablet Take 20 mg by mouth daily. 8 am    [provider]  omeprazole (PRILOSEC) 20 MG capsule Take 20 mg by mouth daily.     [provider]  ondansetron (ZOFRAN) 4 MG tablet Take 1 tablet (4 mg total) by mouth every 6 (six) hours as needed for nausea. 09/23/16   Nicholes Mango, MD  OXYGEN Inhale 2 L into the lungs continuous.    [provider]  spironolactone (ALDACTONE) 25 MG tablet Take 25 mg by mouth daily. 8 am    [provider]    Allergies Tetracyclines & related and Morphine  Family History  Problem Relation Age of Onset  . Colon cancer Father   . Brain cancer Mother   . CAD Neg Hx   . Diabetes Neg Hx     Social History Social History  Substance Use Topics  . Smoking status: Former Smoker    Quit date: 05/21/1982  . Smokeless tobacco: Never Used  . Alcohol use 1.8 oz/week    3 Glasses of wine per week    Review of  Systems Constitutional: No fever/chills Eyes: No visual changes. ENT: No sore throat. Cardiovascular: Positive for palpitations. Respiratory: Denies shortness of breath. Gastrointestinal: No abdominal pain.  Positive for nausea.  Genitourinary: Negative for dysuria. Musculoskeletal: Negative for back pain. Skin: Negative for rash. Neurological: Negative for headaches, focal weakness or numbness.  ____________________________________________   PHYSICAL EXAM:  VITAL SIGNS: ED Triage Vitals  Enc Vitals Group     BP 10/30/16 2009 130/75     Pulse Rate 10/30/16 2009 (!) 138     Resp 10/30/16 2009 (!) 143     Temp 10/30/16 2009 97.8 F (36.6 C)     Temp Source 10/30/16 2009 Oral     SpO2 10/30/16 2008 96 %     Weight 10/30/16 2013 212 lb 6.4 oz (96.3 kg)     Height 10/30/16 2013 5\' 9"  (1.753 m)     Head Circumference --      Peak Flow --      Pain Score 10/30/16 2008 0    Constitutional: Alert and oriented. Well appearing and in no distress. Eyes: Conjunctivae are normal.  ENT   Head: Normocephalic and atraumatic.   Nose: No congestion/rhinnorhea.   Mouth/Throat: Mucous membranes are moist.   Neck: No stridor. Hematological/Lymphatic/Immunilogical: No cervical lymphadenopathy. Cardiovascular: Tachycardic irregularly irregular rhythm.  Respiratory: Normal respiratory effort without tachypnea nor retractions. Breath sounds are clear and equal bilaterally. No wheezes/rales/rhonchi. Gastrointestinal: Soft and non tender. No rebound. No guarding.  Genitourinary: Deferred Musculoskeletal: Normal range of motion in all extremities. No lower extremity edema. Neurologic:  Normal speech and language. No gross focal neurologic deficits are appreciated.  Skin:  Skin is warm, dry and intact. No rash noted. Psychiatric: Mood and affect are normal. Speech and behavior are normal. Patient exhibits appropriate insight and  judgment.  ____________________________________________    LABS (pertinent positives/negatives)  Labs Reviewed  CBC WITH DIFFERENTIAL/PLATELET - Abnormal; Notable for the following:       Result Value   RBC 3.50 (*)    Hemoglobin 10.3 (*)    HCT 29.9 (*)    RDW 14.8 (*)    Neutro Abs 7.9 (*)    All other components within normal limits  BASIC METABOLIC PANEL -  Abnormal; Notable for the following:    Sodium 134 (*)    Potassium 2.9 (*)    Chloride 97 (*)    Glucose, Bld 338 (*)    Creatinine, Ser 2.66 (*)    Calcium 8.3 (*)    GFR calc non Af Amer 21 (*)    GFR calc Af Amer 25 (*)    All other components within normal limits  TROPONIN I - Abnormal; Notable for the following:    Troponin I 0.05 (*)    All other components within normal limits     ____________________________________________   EKG  I, Nance Pear, attending physician, personally viewed and interpreted this EKG  EKG Time: 2010 Rate: 135 Rhythm: atrial fibrillation with RVR Axis: right axis deviation Intervals: qtc 539 QRS: RBBB, LPFB ST changes: no st elevation Impression: abnormal ekg  I, Nance Pear, attending physician, personally viewed and interpreted this EKG  EKG Time: 2217 Rate: 88 Rhythm: normal sinus rhythm Axis: right axis deviation Intervals: qtc 541 QRS: RBBB, LPFB ST changes: no st elevation Impression: abnormal ekg   ____________________________________________    RADIOLOGY  None   ____________________________________________   PROCEDURES  Procedures  CRITICAL CARE Performed by: Nance Pear   Total critical care time: 35 minutes  Critical care time was exclusive of separately billable procedures and treating other patients.  Critical care was necessary to treat or prevent imminent or life-threatening deterioration.  Critical care was time spent personally by me on the following activities: development of treatment plan with patient and/or  surrogate as well as nursing, discussions with consultants, evaluation of patient's response to treatment, examination of patient, obtaining history from patient or surrogate, ordering and performing treatments and interventions, ordering and review of laboratory studies, ordering and review of radiographic studies, pulse oximetry and re-evaluation of patient's condition.  ____________________________________________   INITIAL IMPRESSION / ASSESSMENT AND PLAN / ED COURSE  Pertinent labs & imaging results that were available during my care of the patient were reviewed by me and considered in my medical decision making (see chart for details).  Patient presented to the emergency department today because he was feeling unwell after analysis. Initial EKG did show A. fib with RVR. Patient does not have a history of atrial fibrillation. Patient was given IV Toprol to time help control the rate. The patient did spontaneously convert to a normal sinus rhythm and his symptoms did improve. Additionally patient did have an elevation of his troponin to 0.05. Given the comminution of the elevated troponin and new onset A. fib patient will be admitted to the hospital service for further workup and evaluation.  ____________________________________________   FINAL CLINICAL IMPRESSION(S) / ED DIAGNOSES  Final diagnoses:  Atrial fibrillation, unspecified type ([place])  Elevated troponin     Note: This dictation was prepared with Dragon dictation. Any transcriptional errors that result from this process are unintentional     Nance Pear, MD 10/30/16 2252

## 2016-10-31 DIAGNOSIS — I4891 Unspecified atrial fibrillation: Secondary | ICD-10-CM | POA: Diagnosis not present

## 2016-10-31 DIAGNOSIS — R748 Abnormal levels of other serum enzymes: Secondary | ICD-10-CM | POA: Diagnosis not present

## 2016-10-31 LAB — BASIC METABOLIC PANEL
Anion gap: 8 (ref 5–15)
BUN: 23 mg/dL — AB (ref 6–20)
CALCIUM: 8.4 mg/dL — AB (ref 8.9–10.3)
CHLORIDE: 97 mmol/L — AB (ref 101–111)
CO2: 30 mmol/L (ref 22–32)
Creatinine, Ser: 3.57 mg/dL — ABNORMAL HIGH (ref 0.61–1.24)
GFR calc Af Amer: 17 mL/min — ABNORMAL LOW (ref >60)
GFR calc non Af Amer: 15 mL/min — ABNORMAL LOW (ref >60)
Glucose, Bld: 291 mg/dL — ABNORMAL HIGH (ref 65–99)
Potassium: 3.8 mmol/L (ref 3.5–5.1)
Sodium: 135 mmol/L (ref 135–145)

## 2016-10-31 LAB — LIPID PANEL
Cholesterol: 132 mg/dL (ref 0–200)
HDL: 25 mg/dL — ABNORMAL LOW (ref >40)
LDL CALC: 73 mg/dL (ref 0–99)
Total CHOL/HDL Ratio: 5.3 RATIO
Triglycerides: 170 mg/dL — ABNORMAL HIGH (ref <150)
VLDL: 34 mg/dL (ref 0–40)

## 2016-10-31 LAB — CBC
HEMATOCRIT: 29.5 % — AB (ref 40.0–52.0)
HEMOGLOBIN: 10 g/dL — AB (ref 13.0–18.0)
MCH: 29.1 pg (ref 26.0–34.0)
MCHC: 33.8 g/dL (ref 32.0–36.0)
MCV: 86 fL (ref 80.0–100.0)
Platelets: 147 10*3/uL — ABNORMAL LOW (ref 150–440)
RBC: 3.43 MIL/uL — AB (ref 4.40–5.90)
RDW: 14.6 % — ABNORMAL HIGH (ref 11.5–14.5)
WBC: 7.7 10*3/uL (ref 3.8–10.6)

## 2016-10-31 LAB — MAGNESIUM: Magnesium: 1.8 mg/dL (ref 1.7–2.4)

## 2016-10-31 LAB — TROPONIN I
TROPONIN I: 1.48 ng/mL — AB (ref <0.03)
TROPONIN I: 1.66 ng/mL — AB (ref <0.03)
Troponin I: 1.45 ng/mL (ref <0.03)

## 2016-10-31 LAB — GLUCOSE, CAPILLARY
GLUCOSE-CAPILLARY: 289 mg/dL — AB (ref 65–99)
GLUCOSE-CAPILLARY: 318 mg/dL — AB (ref 65–99)

## 2016-10-31 LAB — HEPARIN LEVEL (UNFRACTIONATED): HEPARIN UNFRACTIONATED: 0.17 [IU]/mL — AB (ref 0.30–0.70)

## 2016-10-31 LAB — APTT: APTT: 34 s (ref 24–36)

## 2016-10-31 LAB — PROTIME-INR
INR: 1.11
PROTHROMBIN TIME: 14.3 s (ref 11.4–15.2)

## 2016-10-31 MED ORDER — ORAL CARE MOUTH RINSE
15.0000 mL | Freq: Two times a day (BID) | OROMUCOSAL | Status: DC
  Administered 2016-10-31: 15 mL via OROMUCOSAL

## 2016-10-31 MED ORDER — CALCIUM ACETATE (PHOS BINDER) 667 MG PO CAPS
1334.0000 mg | ORAL_CAPSULE | Freq: Three times a day (TID) | ORAL | Status: DC
  Administered 2016-10-31: 1334 mg via ORAL
  Filled 2016-10-31: qty 2

## 2016-10-31 MED ORDER — SODIUM CHLORIDE 0.9 % IV SOLN: 250.0000 mL | INTRAVENOUS | Status: DC | PRN

## 2016-10-31 MED ORDER — GLUCOSE 40 % PO GEL: 1.0000 | ORAL | Status: DC | PRN

## 2016-10-31 MED ORDER — HEPARIN BOLUS VIA INFUSION
4000.0000 [IU] | Freq: Once | INTRAVENOUS | Status: AC
  Administered 2016-10-31: 4000 [IU] via INTRAVENOUS
  Filled 2016-10-31: qty 4000

## 2016-10-31 MED ORDER — HEPARIN (PORCINE) IN NACL 100-0.45 UNIT/ML-% IJ SOLN
1300.0000 [IU]/h | INTRAMUSCULAR | Status: DC
  Administered 2016-10-31: 1000 [IU]/h via INTRAVENOUS
  Filled 2016-10-31: qty 250

## 2016-10-31 MED ORDER — GLUCAGON (RDNA) 1 MG IJ KIT
1.0000 mg | PACK | Freq: Once | INTRAMUSCULAR | Status: DC | PRN
  Filled 2016-10-31: qty 1

## 2016-10-31 MED ORDER — ASPIRIN EC 81 MG PO TBEC: 81.0000 mg | DELAYED_RELEASE_TABLET | Freq: Every day | ORAL | Status: DC

## 2016-10-31 MED ORDER — CARVEDILOL 6.25 MG PO TABS
6.2500 mg | ORAL_TABLET | Freq: Two times a day (BID) | ORAL | Status: DC
  Administered 2016-10-31: 6.25 mg via ORAL
  Filled 2016-10-31: qty 1

## 2016-10-31 MED ORDER — HEPARIN BOLUS VIA INFUSION
2700.0000 [IU] | Freq: Once | INTRAVENOUS | Status: AC
  Administered 2016-10-31: 2700 [IU] via INTRAVENOUS
  Filled 2016-10-31: qty 2700

## 2016-10-31 MED ORDER — FUROSEMIDE 40 MG PO TABS
40.0000 mg | ORAL_TABLET | Freq: Two times a day (BID) | ORAL | Status: DC
  Administered 2016-10-31: 40 mg via ORAL
  Filled 2016-10-31: qty 1

## 2016-10-31 MED ORDER — ACETAMINOPHEN 325 MG PO TABS: 650.0000 mg | ORAL_TABLET | ORAL | Status: DC | PRN

## 2016-10-31 MED ORDER — ASPIRIN EC 81 MG PO TBEC
81.0000 mg | DELAYED_RELEASE_TABLET | Freq: Every day | ORAL | Status: DC
  Administered 2016-10-31: 81 mg via ORAL
  Filled 2016-10-31: qty 1

## 2016-10-31 MED ORDER — LISINOPRIL 20 MG PO TABS: 20.0000 mg | ORAL_TABLET | Freq: Every day | ORAL | Status: DC

## 2016-10-31 MED ORDER — AMLODIPINE BESYLATE 5 MG PO TABS: 5.0000 mg | ORAL_TABLET | Freq: Every day | ORAL | Status: DC

## 2016-10-31 MED ORDER — SPIRONOLACTONE 25 MG PO TABS
25.0000 mg | ORAL_TABLET | Freq: Two times a day (BID) | ORAL | Status: DC
  Administered 2016-10-31: 25 mg via ORAL
  Filled 2016-10-31: qty 1

## 2016-10-31 MED ORDER — SODIUM CHLORIDE 0.9% FLUSH: 3.0000 mL | INTRAVENOUS | Status: DC | PRN

## 2016-10-31 MED ORDER — SODIUM CHLORIDE 0.9% FLUSH
3.0000 mL | Freq: Two times a day (BID) | INTRAVENOUS | Status: DC
Start: 2016-10-31 — End: 2016-10-31
  Administered 2016-10-31: 3 mL via INTRAVENOUS

## 2016-10-31 MED ORDER — ATORVASTATIN CALCIUM 20 MG PO TABS: 20.0000 mg | ORAL_TABLET | Freq: Every day | ORAL | Status: DC

## 2016-10-31 MED ORDER — ALUM & MAG HYDROXIDE-SIMETH 200-200-20 MG/5ML PO SUSP
30.0000 mL | ORAL | Status: DC | PRN
  Administered 2016-10-31: 30 mL via ORAL
  Filled 2016-10-31: qty 30

## 2016-10-31 MED ORDER — INSULIN ASPART 100 UNIT/ML ~~LOC~~ SOLN
0.0000 [IU] | Freq: Three times a day (TID) | SUBCUTANEOUS | Status: DC
  Administered 2016-10-31: 7 [IU] via SUBCUTANEOUS
  Filled 2016-10-31: qty 1

## 2016-10-31 MED ORDER — INSULIN DETEMIR 100 UNIT/ML ~~LOC~~ SOLN
10.0000 [IU] | Freq: Every day | SUBCUTANEOUS | Status: DC
  Administered 2016-10-31: 10 [IU] via SUBCUTANEOUS
  Filled 2016-10-31 (×3): qty 0.1

## 2016-10-31 MED ORDER — INSULIN ASPART 100 UNIT/ML ~~LOC~~ SOLN: 0.0000 [IU] | Freq: Every day | SUBCUTANEOUS | Status: DC

## 2016-10-31 MED ORDER — PANTOPRAZOLE SODIUM 40 MG PO TBEC
40.0000 mg | DELAYED_RELEASE_TABLET | Freq: Every day | ORAL | Status: DC
  Administered 2016-10-31: 40 mg via ORAL
  Filled 2016-10-31: qty 1

## 2016-10-31 MED ORDER — DOXAZOSIN MESYLATE 4 MG PO TABS
8.0000 mg | ORAL_TABLET | Freq: Every day | ORAL | Status: DC
  Filled 2016-10-31 (×2): qty 1

## 2016-10-31 MED ORDER — IPRATROPIUM-ALBUTEROL 0.5-2.5 (3) MG/3ML IN SOLN: 3.0000 mL | Freq: Four times a day (QID) | RESPIRATORY_TRACT | Status: DC | PRN

## 2016-10-31 MED ORDER — ONDANSETRON HCL 4 MG/2ML IJ SOLN: 4.0000 mg | Freq: Four times a day (QID) | INTRAMUSCULAR | Status: DC | PRN

## 2016-10-31 MED ORDER — ISOSORBIDE MONONITRATE ER 30 MG PO TB24
30.0000 mg | ORAL_TABLET | Freq: Every day | ORAL | Status: DC
  Administered 2016-10-31: 30 mg via ORAL
  Filled 2016-10-31: qty 1

## 2016-10-31 NOTE — Progress Notes (Signed)
Notified of 4 beat run VT.  Nurse at bedside.  Pt denies palpitations or shortness of breath.  Pt requests to be left alone to sleep.  Informed rest periods will be allowed after medications given;pt agreeable to plan at this time.

## 2016-10-31 NOTE — Progress Notes (Signed)
Please note, patient has a PENDING Home PALLIATIVE consult from last admission, he was rehospitalized prior to being seen at home. CMRN Joni Reining made aware. Thank you. Flo Shanks RN, BSN, [facility] [place], hospital Liaison [phone] c

## 2016-10-31 NOTE — Progress Notes (Signed)
[facility]  ROUNDING NOTE   Subjective:   Patient known to our practice from previous admissions and from outpatient dialysis He went for his routine dialysis yesterday. After he got home, he felt extremely weak. He presented to the emergency room for evaluation, where he was found to have tachycardia, atrial fibrillation with RVR. Troponins were elevated and peaked at 1.66. He was placed on heparin drip. Patient states he feels well this morning. His cardiac rhythm has returned to regular today. He is asking about going home.   Objective:  Vital signs in last 24 hours:  Temp:  [97.8 F (36.6 C)-98.2 F (36.8 C)] 97.9 F (36.6 C) (08/02 1140) Pulse Rate:  [57-141] 77 (08/02 1140) Resp:  [11-143] 14 (08/02 1140) BP: (101-168)/(50-114) 159/59 (08/02 1140) SpO2:  [94 %-100 %] 97 % (08/02 1140) Weight:  [96.3 kg (212 lb 6.4 oz)] 96.3 kg (212 lb 6.4 oz) (08/01 2013)  Weight change:  Filed Weights   10/30/16 2013  Weight: 96.3 kg (212 lb 6.4 oz)    Intake/Output: No intake/output data recorded.   Intake/Output this shift:  Total I/O In: 240 [P.O.:240] Out: -   Physical Exam: General: No acute distress  Head: Normocephalic, atraumatic. Moist oral mucosal membranes  Eyes: Anicteric  Neck: Supple  Lungs:  Diminished breath sounds at Bases, normal effort   Heart: S1S2 no rubs, Regular rhythm   Abdomen:  Soft, nontender, bowel sounds present  Extremities: trace peripheral edema.  Neurologic: Awake, alert, following commands  Skin: No lesions  Access: Left upper extremity AV fistula     Basic Metabolic Panel:  Recent Labs Lab 10/30/16 2017 10/31/16 0843  NA 134* 135  K 2.9* 3.8  CL 97* 97*  CO2 29 30  GLUCOSE 338* 291*  BUN 17 23*  CREATININE 2.66* 3.57*  CALCIUM 8.3* 8.4*    Liver Function Tests: No results for input(s): AST, ALT, ALKPHOS, BILITOT, PROT, ALBUMIN in the last 168 hours. No results for input(s): LIPASE, AMYLASE in the last 168  hours. No results for input(s): AMMONIA in the last 168 hours.  CBC:  Recent Labs Lab 10/30/16 2017 10/31/16 0843  WBC 10.0 7.7  NEUTROABS 7.9*  --   HGB 10.3* 10.0*  HCT 29.9* 29.5*  MCV 85.3 86.0  PLT 151 147*    Cardiac Enzymes:  Recent Labs Lab 10/30/16 2017 10/31/16 0221 10/31/16 0843 10/31/16 1309  TROPONINI 0.05* 1.48* 1.66* 1.45*    BNP: Invalid input(s): POCBNP  CBG:  Recent Labs Lab 10/31/16 1127  GLUCAP 45*    Microbiology: Results for orders placed or performed during the hospital encounter of 10/16/16  Urine culture     Status: Abnormal   Collection Time: 10/17/16  6:43 AM  Result Value Ref Range Status   Specimen Description URINE, RANDOM  Final   Special Requests NONE  Final   Culture (A)  Final    <10,000 COLONIES/mL INSIGNIFICANT GROWTH Performed at [facility], [place] [address], [place], [place] [address]    Report Status 10/18/2016 FINAL  Final  MRSA PCR Screening     Status: None   Collection Time: 10/17/16  6:36 PM  Result Value Ref Range Status   MRSA by PCR NEGATIVE NEGATIVE Final    Comment:        The GeneXpert MRSA Assay (FDA approved for NASAL specimens only), is one component of a comprehensive MRSA colonization surveillance program. It is not intended to diagnose MRSA infection nor to guide  or monitor treatment for MRSA infections.     Coagulation Studies:  Recent Labs  10/31/16 0441  LABPROT 14.3  INR 1.11    Urinalysis: No results for input(s): COLORURINE, LABSPEC, PHURINE, GLUCOSEU, HGBUR, BILIRUBINUR, KETONESUR, PROTEINUR, UROBILINOGEN, NITRITE, LEUKOCYTESUR in the last 72 hours.  Invalid input(s): APPERANCEUR    Imaging: No results found.   Medications:   . sodium chloride    . heparin 1,000 Units/hr (10/31/16 0507)   . amLODipine  5 mg Oral QHS  . aspirin  324 mg Oral Once  . aspirin EC  81 mg Oral Daily  . atorvastatin  20 mg Oral QHS  . calcium acetate  1,334 mg Oral TID WC  .  carvedilol  6.25 mg Oral BID  . doxazosin  8 mg Oral QHS  . furosemide  40 mg Oral BID  . insulin aspart  0-5 Units Subcutaneous QHS  . insulin aspart  0-9 Units Subcutaneous TID WC  . insulin detemir  10 Units Subcutaneous Daily  . isosorbide mononitrate  30 mg Oral Daily  . lisinopril  20 mg Oral QHS  . mouth rinse  15 mL Mouth Rinse BID  . pantoprazole  40 mg Oral Daily  . sodium chloride flush  3 mL Intravenous Q12H  . spironolactone  25 mg Oral BID   sodium chloride, acetaminophen, alum & mag hydroxide-simeth, dextrose, glucagon, ipratropium-albuterol, ondansetron (ZOFRAN) IV, sodium chloride flush  Assessment/ Plan:  76 y.o. caucasian male with diabetes mellitus type II insulin dependent, hypertension, coronary artery disease, hyperlipidemia, prostate cancer status post prostatectomy, right total knee, appendectomy.   MWF [facility]  1. End Stage Renal Disease MWF: - We will arrange for dialysis tomorrow  2.  Anemia of chronic kidney disease:  Continue to monitor CBC  - Hemoglobin 10.0    3.  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  - Management as per hospitalist.  4. Secondary Hyperparathyroidism:   - monitor phos  5. A Fib with RVR, + troponin - cardiology evaluation     LOS: 1 Calvin Good 8/2/20182:05 PM

## 2016-10-31 NOTE — Care Management (Signed)
Patient is chronic ESRD with HD at  [facility].  Notified Elvera Bicker with [facility] [employer].  Patient is currently open to [facility] and PT.  Patient is to also have Palliative care to start in the home by [facility].  Notified [facility] of admission

## 2016-10-31 NOTE — H&P (Signed)
[facility] NAME: Calvin Good    MR#:  [id number]  DATE OF BIRTH:  Feb 04, 1941  DATE OF ADMISSION:  10/30/2016  PRIMARY CARE PHYSICIAN: [name], MD   REQUESTING/REFERRING PHYSICIAN:   CHIEF COMPLAINT:   Chief Complaint  Patient presents with  . Weakness  . Nausea    HISTORY OF PRESENT ILLNESS: Calvin Good  is a 76 y.o. male with a known history of End-stage renal disease on dialysis, prostate cancer, COPD on home oxygen, diabetes is type II, GERD, hyperlipidemia, hypertension presented to the emergency room with nausea and weakness. Patient gets dialysis on Monday Wednesday Friday. Patient had dialysis on Wednesday and then felt very nauseous. Has generalized weakness. Patient was evaluated in the emergency room initially was in atrial fibrillation. No complaints of any chest pain, shortness of breath. First set of troponin was borderline 0.05. Hospitalist service was consulted for further care.  PAST MEDICAL HISTORY:   Past Medical History:  Diagnosis Date  . Cancer [facility])    Prostate  . CHF (congestive heart failure) ([place])   . Chronic kidney disease   . Complication of anesthesia    hard to wake up  . COPD (chronic obstructive pulmonary disease) ([place])   . Diabetes mellitus without complication ([place])   . Difficult intubation   . Dyspnea   . GERD (gastroesophageal reflux disease)   . Hyperlipidemia   . Hypertension   . Pleural effusion   . Renal insufficiency     PAST SURGICAL HISTORY: Past Surgical History:  Procedure Laterality Date  . APPENDECTOMY    . AV FISTULA PLACEMENT Left 05/30/2016   Procedure: ARTERIOVENOUS (AV) FISTULA CREATION ( BRACHIOCEPHALIC );  Surgeon: [name], MD;  Location: [facility] ORS;  Service: Vascular;  Laterality: Left;  . CAPD INSERTION N/A 05/30/2016   Procedure: LAPAROSCOPIC INSERTION CONTINUOUS AMBULATORY PERITONEAL DIALYSIS  (CAPD) CATHETER;  Surgeon: [name], MD;   Location: [facility] ORS;  Service: Vascular;  Laterality: N/A;  . DIALYSIS/PERMA CATHETER INSERTION    . DIALYSIS/PERMA CATHETER REMOVAL N/A 08/21/2016   Procedure: Dialysis/Perma Catheter Removal;  Surgeon: [name], MD;  Location: [place] [facility];  Service: Cardiovascular;  Laterality: N/A;  . JOINT REPLACEMENT     right knee   . LEFT HEART CATH AND CORONARY ANGIOGRAPHY N/A 08/21/2016   Procedure: Left Heart Cath and Coronary Angiography possible PCI;  Surgeon: [name], MD;  Location: [place] [facility];  Service: Cardiovascular;  Laterality: N/A;  . postate removal     . PROSTATE SURGERY    . PSEUDOANERYSM COMPRESSION Right 08/27/2016   Procedure: Pseudoanerysm Compression;  Surgeon: Katha Cabal, MD;  Location: [place] [facility];  Service: Cardiovascular;  Laterality: Right;  . REMOVAL OF A DIALYSIS CATHETER N/A 08/08/2016   Procedure: REMOVAL OF A DIALYSIS CATHETER;  Surgeon: [name], MD;  Location: [facility] ORS;  Service: Vascular;  Laterality: N/A;    SOCIAL HISTORY:  Social History  Substance Use Topics  . Smoking status: Former Smoker    Quit date: 05/21/1982  . Smokeless tobacco: Never Used  . Alcohol use 1.8 oz/week    3 Glasses of wine per week    FAMILY HISTORY:  Family History  Problem Relation Age of Onset  . Colon cancer Father   . Brain cancer Mother   . CAD Neg Hx   . Diabetes Neg Hx     DRUG ALLERGIES:  Allergies  Allergen Reactions  . Tetracyclines & Related Other (See Comments)    Reaction: unknown happened a long time ago and pt doesn't remember reaction   . Morphine Other (See Comments)    Other reaction(s): disoriented Reaction: confusion    REVIEW OF SYSTEMS:   CONSTITUTIONAL: No fever, fatigue or weakness.  EYES: No blurred or double vision.  EARS, NOSE, AND THROAT: No tinnitus or ear pain.  RESPIRATORY: No cough, shortness of breath, wheezing or hemoptysis.  CARDIOVASCULAR: No chest pain, orthopnea, edema.   GASTROINTESTINAL: Has nausea,  No vomiting, diarrhea or abdominal pain.  GENITOURINARY: No dysuria, hematuria.  ENDOCRINE: No polyuria, nocturia,  HEMATOLOGY: No anemia, easy bruising or bleeding SKIN: No rash or lesion. MUSCULOSKELETAL: No joint pain or arthritis.   NEUROLOGIC: No tingling, numbness, weakness.  PSYCHIATRY: No anxiety or depression.   MEDICATIONS AT HOME:  Prior to Admission medications   Medication Sig Start Date End Date Taking? Authorizing Provider  acetaminophen (TYLENOL) 325 MG tablet Take 2 tablets (650 mg total) by mouth every 6 (six) hours as needed for mild pain (or Fever >/= 101). 09/23/16  Yes Gouru, Illene Silver, MD  amLODipine (NORVASC) 5 MG tablet Take 5 mg by mouth at bedtime.    Yes [provider]  aspirin EC 81 MG tablet Take 81 mg by mouth daily.   Yes [provider]  atorvastatin (LIPITOR) 20 MG tablet Take 20 mg by mouth at bedtime.    Yes [provider]  calcium acetate (PHOSLO) 667 MG capsule Take 1,334 mg by mouth 3 (three) times daily with meals.    Yes [provider]  carvedilol (COREG) 6.25 MG tablet Take 6.25 mg by mouth 2 (two) times daily. 8 am, 5 pm 09/05/16  Yes [provider]  dextrose (GLUTOSE) 40 % GEL Take 1 Tube by mouth every 30 (thirty) minutes as needed for low blood sugar. Or until BS > 70 and pt is still responsive.   Yes [provider]  doxazosin (CARDURA) 8 MG tablet Take 8 mg by mouth at bedtime.    Yes [provider]  furosemide (LASIX) 40 MG tablet Take 40 mg by mouth 2 (two) times daily.    Yes [provider]  glucagon (GLUCAGON EMERGENCY) 1 MG injection Inject 1 mg into the muscle once as needed. If not responding to oral glucose, snack. May repeat x 1 after 15 minutes. Use only if pt is or is becoming unresponsive as needed.   Yes [provider]  insulin lispro (HUMALOG) 100 UNIT/ML injection Inject 10 Units into the skin 3 (three) times daily  before meals.   Yes [provider]  ipratropium-albuterol (DUONEB) 0.5-2.5 (3) MG/3ML SOLN Take 3 mLs by nebulization every 6 (six) hours as needed. Patient taking differently: Take 3 mLs by nebulization every 6 (six) hours as needed (shortness of breath).  09/23/16  Yes Gouru, Illene Silver, MD  isosorbide mononitrate (IMDUR) 30 MG 24 hr tablet Take 1 tablet (30 mg total) by mouth daily. Patient taking differently: Take 15 mg by mouth daily.  08/22/16  Yes Dustin Flock, MD  Lidocaine-Prilocaine, Bulk, 2.5-2.5 % CREA Apply 1 application topically as needed. Apply to fistula site prior to dialysis treatments as needed   Yes [provider]  lisinopril (PRINIVIL,ZESTRIL) 20 MG tablet Take 20 mg by mouth at bedtime.    Yes [provider]  omeprazole (PRILOSEC) 20 MG capsule Take 20 mg by mouth 2 (two) times daily before a meal.  Yes [provider]  ondansetron (ZOFRAN) 4 MG tablet Take 1 tablet (4 mg total) by mouth every 6 (six) hours as needed for nausea. 09/23/16  Yes Gouru, Illene Silver, MD  OXYGEN Inhale 2 L into the lungs continuous.   Yes [provider]  spironolactone (ALDACTONE) 25 MG tablet Take 25 mg by mouth 2 (two) times daily.    Yes [provider]      PHYSICAL EXAMINATION:   VITAL SIGNS: Blood pressure (!) 141/58, pulse (!) 138, temperature 97.8 F (36.6 C), temperature source Oral, resp. rate 19, height 5\' 9"  (1.753 m), weight 96.3 kg (212 lb 6.4 oz), SpO2 100 %.  GENERAL:  76 y.o.-year-old patient lying in the bed with no acute distress.  EYES: Pupils equal, round, reactive to light and accommodation. No scleral icterus. Extraocular muscles intact.  HEENT: Head atraumatic, normocephalic. Oropharynx and nasopharynx clear.  NECK:  Supple, no jugular venous distention. No thyroid enlargement, no tenderness.  LUNGS: Normal breath sounds bilaterally, no wheezing, rales,rhonchi or crepitation. No use of accessory muscles of respiration.   CARDIOVASCULAR: S1, S2 irregular No murmurs, rubs, or gallops.  ABDOMEN: Soft, nontender, nondistended. Bowel sounds present. No organomegaly or mass.  EXTREMITIES: No pedal edema, cyanosis, or clubbing.  NEUROLOGIC: Cranial nerves II through XII are intact. Muscle strength 5/5 in all extremities. Sensation intact. Gait not checked.  PSYCHIATRIC: The patient is alert and oriented x 3.  SKIN: No obvious rash, lesion, or ulcer.   LABORATORY PANEL:   CBC  Recent Labs Lab 10/30/16 2017  WBC 10.0  HGB 10.3*  HCT 29.9*  PLT 151  MCV 85.3  MCH 29.4  MCHC 34.5  RDW 14.8*  LYMPHSABS 1.3  MONOABS 0.6  EOSABS 0.2  BASOSABS 0.0   ------------------------------------------------------------------------------------------------------------------  Chemistries   Recent Labs Lab 10/30/16 2017  NA 134*  K 2.9*  CL 97*  CO2 29  GLUCOSE 338*  BUN 17  CREATININE 2.66*  CALCIUM 8.3*   ------------------------------------------------------------------------------------------------------------------ estimated creatinine clearance is 26.2 mL/min (A) (by C-G formula based on SCr of 2.66 mg/dL (H)). ------------------------------------------------------------------------------------------------------------------ No results for input(s): TSH, T4TOTAL, T3FREE, THYROIDAB in the last 72 hours.  Invalid input(s): FREET3   Coagulation profile No results for input(s): INR, PROTIME in the last 168 hours. ------------------------------------------------------------------------------------------------------------------- No results for input(s): DDIMER in the last 72 hours. -------------------------------------------------------------------------------------------------------------------  Cardiac Enzymes  Recent Labs Lab 10/30/16 2017  TROPONINI 0.05*   ------------------------------------------------------------------------------------------------------------------ Invalid input(s):  POCBNP  ---------------------------------------------------------------------------------------------------------------  Urinalysis No results found for: COLORURINE, APPEARANCEUR, LABSPEC, PHURINE, GLUCOSEU, HGBUR, BILIRUBINUR, KETONESUR, PROTEINUR, UROBILINOGEN, NITRITE, LEUKOCYTESUR   RADIOLOGY: No results found.  EKG: Orders placed or performed during the hospital encounter of 10/30/16  . EKG 12-Lead  . EKG 12-Lead    IMPRESSION AND PLAN: 76 year old male patient with history of COPD on home oxygen, diabetes. Will, end-stage renal disease on dialysis, prostate cancer, hyperlipidemia, GERD, hypertension presented to the emergency room with nausea and weakness. Patient was found to be in atrial fibrillation when he presented to the emergency room with rapid rate. Rate was controlled with IV metoprolol. Admitting diagnosis 1. Atrial fibrillation 2. Elevated troponin be secondary to demand ischemia 3. End-stage renal disease on dialysis 4. Emphysema 5. Prostate cancer 6. Hyperlipidemia Treatment plan Admit patient to telemetry Nephrology consultation Cycle troponin Control the heart rate with oral beta blocker Antiemetic medication DVT prophylaxis subcutaneous heparin Cardiology consultation  All the records are reviewed and case discussed with ED provider. Management plans discussed with the patient, family and  they are in agreement.  CODE STATUS:FULL CODE Code Status History    Date Active Date Inactive Code Status Order ID Comments User Context   10/16/2016 11:41 PM 10/21/2016 12:21 AM Full Code [id number]  Vaughan Basta, MD ED   09/14/2016  6:31 PM 09/24/2016  2:43 AM Full Code [id number]  Nicholes Mango, MD Inpatient   08/26/2016  6:45 AM 08/28/2016  8:11 PM Full Code [id number]  Saundra Shelling, MD Inpatient   08/20/2016  3:05 AM 08/22/2016  1:01 PM Full Code [id number]  Lance Coon, MD ED    Advance Directive Documentation     Most Recent Value  Type of Advance  Directive  Living will, Healthcare Power of Attorney  Pre-existing out of facility DNR order (yellow form or pink MOST form)  -  "MOST" Form in Place?  -       TOTAL TIME TAKING CARE OF THIS PATIENT: 51 minutes.    Saundra Shelling M.D on 10/31/2016 at 12:23 AM  Between 7am to 6pm - Pager - [phone]  After 6pm go to [email or web address] - password [email or web address] [facility]  [place] [facility]  Office  [phone]  CC: Primary care physician; [name], MD

## 2016-10-31 NOTE — Consult Note (Addendum)
Reason for Consult:congestive heart failure shortness of breath Referring Physician: Dr. Estanislado Pandy  hospitalist  Calvin Good is an 76 y.o. male.  QAS:[id number] is a 38-yer-old male presents with congestive heart failure symptoms. Patient has a history of end-stage renal disease on dialysis.patient had troubl with volume and fluid and needed increase dialysis fluid withdrawal. Patient complains f generalizedeakness nausea vomiting with some diarrhea she has tachycardia and finally came to emergency room.Patient's had prolonged dialysis treant and HDL cholesterol.denies any chest pain patient had mildly elevated troponin  Past Medical History:  Diagnosis Date  . Cancer [facility])    Prostate  . CHF (congestive heart failure) ([place])   . Chronic kidney disease   . Complication of anesthesia    hard to wake up  . COPD (chronic obstructive pulmonary disease) ([place])   . Diabetes mellitus without complication ([place])   . Difficult intubation   . Dyspnea   . GERD (gastroesophageal reflux disease)   . Hyperlipidemia   . Hypertension   . Pleural effusion   . Renal insufficiency     Past Surgical History:  Procedure Laterality Date  . APPENDECTOMY    . AV FISTULA PLACEMENT Left 05/30/2016   Procedure: ARTERIOVENOUS (AV) FISTULA CREATION ( BRACHIOCEPHALIC );  Surgeon: [name], MD;  Location: [facility] ORS;  Service: Vascular;  Laterality: Left;  . CAPD INSERTION N/A 05/30/2016   Procedure: LAPAROSCOPIC INSERTION CONTINUOUS AMBULATORY PERITONEAL DIALYSIS  (CAPD) CATHETER;  Surgeon: [name], MD;  Location: [facility] ORS;  Service: Vascular;  Laterality: N/A;  . DIALYSIS/PERMA CATHETER INSERTION    . DIALYSIS/PERMA CATHETER REMOVAL N/A 08/21/2016   Procedure: Dialysis/Perma Catheter Removal;  Surgeon: [name], MD;  Location: [place] CV LAB;  Service: Cardiovascular;  Laterality: N/A;  . JOINT REPLACEMENT     right knee   . LEFT HEART CATH AND CORONARY ANGIOGRAPHY N/A 08/21/2016   Procedure: Left Heart  Cath and Coronary Angiography possible PCI;  Surgeon: [name], MD;  Location: [place] CV LAB;  Service: Cardiovascular;  Laterality: N/A;  . postate removal     . PROSTATE SURGERY    . PSEUDOANERYSM COMPRESSION Right 08/27/2016   Procedure: Pseudoanerysm Compression;  Surgeon: Katha Cabal, MD;  Location: [place] CV LAB;  Service: Cardiovascular;  Laterality: Right;  . REMOVAL OF A DIALYSIS CATHETER N/A 08/08/2016   Procedure: REMOVAL OF A DIALYSIS CATHETER;  Surgeon: [name], MD;  Location: [facility] ORS;  Service: Vascular;  Laterality: N/A;    Family History  Problem Relation Age of Onset  . Colon cancer Father   . Brain cancer Mother   . CAD Neg Hx   . Diabetes Neg Hx     Social History:  reports that he quit smoking about 34 years ago. He has never used smokeless tobacco. He reports that he drinks about 1.8 oz of alcohol per week . He reports that he does not use drugs.  Allergies:  Allergies  Allergen Reactions  . Tetracyclines & Related Other (See Comments)    Reaction: unknown happened a long time ago and pt doesn't remember reaction   . Morphine Other (See Comments)    Other reaction(s): disoriented Reaction: confusion    Medications: I have reviewed the patient's current medications.  Results for orders placed or performed during the hospital encounter of 10/30/16 (from the past 48 hour(s))  CBC with Differential     Status: Abnormal   Collection Time: 10/30/16  8:17 PM  Result Value Ref  Range   WBC 10.0 3.8 - 10.6 K/uL   RBC 3.50 (L) 4.40 - 5.90 MIL/uL   Hemoglobin 10.3 (L) 13.0 - 18.0 g/dL   HCT 29.9 (L) 40.0 - 52.0 %   MCV 85.3 80.0 - 100.0 fL   MCH 29.4 26.0 - 34.0 pg   MCHC 34.5 32.0 - 36.0 g/dL   RDW 14.8 (H) 11.5 - 14.5 %   Platelets 151 150 - 440 K/uL   Neutrophils Relative % 79 %   Neutro Abs 7.9 (H) 1.4 - 6.5 K/uL   Lymphocytes Relative 13 %   Lymphs Abs 1.3 1.0 - 3.6 K/uL   Monocytes Relative 6 %   Monocytes Absolute  0.6 0.2 - 1.0 K/uL   Eosinophils Relative 2 %   Eosinophils Absolute 0.2 0 - 0.7 K/uL   Basophils Relative 0 %   Basophils Absolute 0.0 0 - 0.1 K/uL  Basic metabolic panel     Status: Abnormal   Collection Time: 10/30/16  8:17 PM  Result Value Ref Range   Sodium 134 (L) 135 - 145 mmol/L   Potassium 2.9 (L) 3.5 - 5.1 mmol/L   Chloride 97 (L) 101 - 111 mmol/L   CO2 29 22 - 32 mmol/L   Glucose, Bld 338 (H) 65 - 99 mg/dL   BUN 17 6 - 20 mg/dL   Creatinine, Ser 2.66 (H) 0.61 - 1.24 mg/dL   Calcium 8.3 (L) 8.9 - 10.3 mg/dL   GFR calc non Af Amer 21 (L) >60 mL/min   GFR calc Af Amer 25 (L) >60 mL/min    Comment: (NOTE) The eGFR has been calculated using the CKD EPI equation. This calculation has not been validated in all clinical situations. eGFR's persistently <60 mL/min signify possible Chronic Kidney Disease.    Anion gap 8 5 - 15  Troponin I     Status: Abnormal   Collection Time: 10/30/16  8:17 PM  Result Value Ref Range   Troponin I 0.05 (HH) <0.03 ng/mL    Comment: CRITICAL RESULT CALLED TO, READ BACK BY AND VERIFIED WITH KAITLYN [facility] 10/30/16 @ 2144  MLK   Troponin I     Status: Abnormal   Collection Time: 10/31/16  2:21 AM  Result Value Ref Range   Troponin I 1.48 (HH) <0.03 ng/mL    Comment: CRITICAL RESULT CALLED TO, READ BACK BY AND VERIFIED WITH LISA SCOTT @ 0338 ON 10/31/2016 BY CAF   Protime-INR     Status: None   Collection Time: 10/31/16  4:41 AM  Result Value Ref Range   Prothrombin Time 14.3 11.4 - 15.2 seconds   INR 1.11   APTT     Status: None   Collection Time: 10/31/16  4:41 AM  Result Value Ref Range   aPTT 34 24 - 36 seconds  Troponin I     Status: Abnormal   Collection Time: 10/31/16  8:43 AM  Result Value Ref Range   Troponin I 1.66 (HH) <0.03 ng/mL    Comment: CRITICAL RESULT CALLED TO, READ BACK BY AND VERIFIED WITH KAT MURRAY 10/31/16 0933 KLW   Basic metabolic panel     Status: Abnormal   Collection Time: 10/31/16  8:43 AM  Result Value  Ref Range   Sodium 135 135 - 145 mmol/L   Potassium 3.8 3.5 - 5.1 mmol/L   Chloride 97 (L) 101 - 111 mmol/L   CO2 30 22 - 32 mmol/L   Glucose, Bld 291 (H) 65 - 99 mg/dL  BUN 23 (H) 6 - 20 mg/dL   Creatinine, Ser 3.57 (H) 0.61 - 1.24 mg/dL   Calcium 8.4 (L) 8.9 - 10.3 mg/dL   GFR calc non Af Amer 15 (L) >60 mL/min   GFR calc Af Amer 17 (L) >60 mL/min    Comment: (NOTE) The eGFR has been calculated using the CKD EPI equation. This calculation has not been validated in all clinical situations. eGFR's persistently <60 mL/min signify possible Chronic Kidney Disease.    Anion gap 8 5 - 15  Lipid panel     Status: Abnormal   Collection Time: 10/31/16  8:43 AM  Result Value Ref Range   Cholesterol 132 0 - 200 mg/dL   Triglycerides 170 (H) <150 mg/dL   HDL 25 (L) >40 mg/dL   Total CHOL/HDL Ratio 5.3 RATIO   VLDL 34 0 - 40 mg/dL   LDL Cholesterol 73 0 - 99 mg/dL    Comment:        Total Cholesterol/HDL:CHD Risk Coronary Heart Disease Risk Table                     Men   Women  1/2 Average Risk   3.4   3.3  Average Risk       5.0   4.4  2 X Average Risk   9.6   7.1  3 X Average Risk  23.4   11.0        Use the calculated Patient Ratio above and the CHD Risk Table to determine the patient's CHD Risk.        ATP III CLASSIFICATION (LDL):  <100     mg/dL   Optimal  100-129  mg/dL   Near or Above                    Optimal  130-159  mg/dL   Borderline  160-189  mg/dL   High  >190     mg/dL   Very High   CBC     Status: Abnormal   Collection Time: 10/31/16  8:43 AM  Result Value Ref Range   WBC 7.7 3.8 - 10.6 K/uL   RBC 3.43 (L) 4.40 - 5.90 MIL/uL   Hemoglobin 10.0 (L) 13.0 - 18.0 g/dL   HCT 29.5 (L) 40.0 - 52.0 %   MCV 86.0 80.0 - 100.0 fL   MCH 29.1 26.0 - 34.0 pg   MCHC 33.8 32.0 - 36.0 g/dL   RDW 14.6 (H) 11.5 - 14.5 %   Platelets 147 (L) 150 - 440 K/uL  Glucose, capillary     Status: Abnormal   Collection Time: 10/31/16 11:27 AM  Result Value Ref Range    Glucose-Capillary 289 (H) 65 - 99 mg/dL   Comment 1 Notify RN   Troponin I     Status: Abnormal   Collection Time: 10/31/16  1:09 PM  Result Value Ref Range   Troponin I 1.45 (HH) <0.03 ng/mL    Comment: CRITICAL VALUE NOTED. VALUE IS CONSISTENT WITH PREVIOUSLY REPORTED/CALLED VALUE. QSD  Heparin level (unfractionated)     Status: Abnormal   Collection Time: 10/31/16  1:09 PM  Result Value Ref Range   Heparin Unfractionated 0.17 (L) 0.30 - 0.70 IU/mL    Comment:        IF HEPARIN RESULTS ARE BELOW EXPECTED VALUES, AND PATIENT DOSAGE HAS BEEN CONFIRMED, SUGGEST FOLLOW UP TESTING OF ANTITHROMBIN III LEVELS.   Magnesium     Status: None  Collection Time: 10/31/16  1:09 PM  Result Value Ref Range   Magnesium 1.8 1.7 - 2.4 mg/dL  Glucose, capillary     Status: Abnormal   Collection Time: 10/31/16  2:56 PM  Result Value Ref Range   Glucose-Capillary 318 (H) 65 - 99 mg/dL    No results found.  Review of Systems  Constitutional: Positive for malaise/fatigue.  HENT: Positive for congestion.   Eyes: Negative.   Respiratory: Positive for shortness of breath.   Cardiovascular: Positive for palpitations, orthopnea, leg swelling and PND.  Gastrointestinal: Positive for diarrhea, nausea and vomiting.  Genitourinary: Negative.   Musculoskeletal: Positive for myalgias.  Skin: Negative.   Neurological: Positive for weakness.  Endo/Heme/Allergies: Negative.   Psychiatric/Behavioral: Negative.    Blood pressure (!) 159/59, pulse 77, temperature 97.9 F (36.6 C), resp. rate 14, height 5' 9"  (1.753 m), weight 96.3 kg (212 lb 6.4 oz), SpO2 97 %. Physical Exam  Nursing note and vitals reviewed. Constitutional: He is oriented to person, place, and time. He appears well-developed and well-nourished.  HENT:  Head: Normocephalic and atraumatic.  Eyes: Pupils are equal, round, and reactive to light. Conjunctivae and EOM are normal.  Neck: Normal range of motion. Neck supple.  Cardiovascular:  Normal rate and regular rhythm.   Murmur heard. Respiratory: Effort normal and breath sounds normal.  GI: Soft. Bowel sounds are normal.  Musculoskeletal: He exhibits edema.  Neurological: He is alert and oriented to person, place, and time. He has normal reflexes.  Skin: Skin is warm and dry.  Psychiatric: He has a normal mood and affect.    Assessment/Plan: Congestive heart failure systolic Volume overload Shortness of breath Demand ischemia End-stage renal disease on dialysis COPD Obesity Cardiomyopathy systolic dysfunction   PLAN Agree with  IV Lasix therapy for volume overload Demand ischemia with elevated troponin recommend conservative medicay Continue aggressive dialysis therapy for reducing and dry weight Continue inhalers as necessary Continue diuretic therapy to help with diuresis Continue diabetes management type 2 diabetes Agree with current therapy for hyperlipidemia Proceed with GERD current therapy Continue rate control for atrial fibrillation Poor candida  Dwayne D Callwood 10/31/2016, 4:13 PM

## 2016-10-31 NOTE — Progress Notes (Signed)
Pt discharged with no concerns offered. VSS. No c/o pain, no SOB. Pt alert and oriented.

## 2016-10-31 NOTE — ED Notes (Signed)
Patient ambulatory with stand by assist. Patient is steady on feet.

## 2016-10-31 NOTE — Progress Notes (Signed)
CRITICAL VALUE ALERT  Critical Value:  Troponin 1.48.  Pt pain free.  Short run PVCs noted on telemetry.  Date & Time Notied: 10/31/16 0355  Provider Notified:Dr Pyreddy  Orders Received/Actions taken: Pharmacy to dose Heparin

## 2016-10-31 NOTE — Progress Notes (Signed)
ANTICOAGULATION CONSULT NOTE - Initial Consult  Pharmacy Consult for heparin Indication: Chest pain/ACS  Allergies  Allergen Reactions  . Tetracyclines & Related Other (See Comments)    Reaction: unknown happened a long time ago and pt doesn't remember reaction   . Morphine Other (See Comments)    Other reaction(s): disoriented Reaction: confusion    Patient Measurements: Height: 5\' 9"  (175.3 cm) Weight: 212 lb 6.4 oz (96.3 kg) IBW/kg (Calculated) : 70.7 Heparin Dosing Weight: 90.8 kg  Vital Signs: Temp: 98.2 F (36.8 C) (08/02 0400) Temp Source: Oral (08/02 0400) BP: 157/55 (08/02 0400) Pulse Rate: 84 (08/02 0400)  Labs:  Recent Labs  10/30/16 2017 10/31/16 0221  HGB 10.3*  --   HCT 29.9*  --   PLT 151  --   CREATININE 2.66*  --   TROPONINI 0.05* 1.48*    Estimated Creatinine Clearance: 26.2 mL/min (A) (by C-G formula based on SCr of 2.66 mg/dL (H)).   Medical History: Past Medical History:  Diagnosis Date  . Cancer [facility])    Prostate  . CHF (congestive heart failure) ([place])   . Chronic kidney disease   . Complication of anesthesia    hard to wake up  . COPD (chronic obstructive pulmonary disease) ([place])   . Diabetes mellitus without complication ([place])   . Difficult intubation   . Dyspnea   . GERD (gastroesophageal reflux disease)   . Hyperlipidemia   . Hypertension   . Pleural effusion   . Renal insufficiency     Medications:  Scheduled:  . amLODipine  5 mg Oral QHS  . aspirin  324 mg Oral Once  . aspirin EC  81 mg Oral Daily  . aspirin EC  81 mg Oral Daily  . atorvastatin  20 mg Oral QHS  . calcium acetate  1,334 mg Oral TID WC  . carvedilol  6.25 mg Oral BID  . doxazosin  8 mg Oral QHS  . furosemide  40 mg Oral BID  . heparin  4,000 Units Intravenous Once  . heparin  5,000 Units Subcutaneous Q8H  . isosorbide mononitrate  30 mg Oral Daily  . lisinopril  20 mg Oral QHS  . mouth rinse  15 mL Mouth Rinse BID  . pantoprazole  40 mg Oral  Daily  . sodium chloride flush  3 mL Intravenous Q12H  . spironolactone  25 mg Oral BID    Assessment: Patient admitted for afib, but now found to have elevated trops to 1.48. Being started on heparin drip  Goal of Therapy:  Heparin level 0.3-0.7 units/ml Monitor platelets by anticoagulation protocol: Yes   Plan:  Give 4000 units bolus x 1  Will start heparin drip @ 1000 units/hr and will check HL @ 1300. Baseline labs ordered.  Tobie Lords, PharmD, BCPS Clinical Pharmacist 10/31/2016

## 2016-10-31 NOTE — Progress Notes (Signed)
ANTICOAGULATION CONSULT NOTE - Initial Consult  Pharmacy Consult for heparin Indication: Chest pain/ACS/Afib  Allergies  Allergen Reactions  . Tetracyclines & Related Other (See Comments)    Reaction: unknown happened a long time ago and pt doesn't remember reaction   . Morphine Other (See Comments)    Other reaction(s): disoriented Reaction: confusion    Patient Measurements: Height: 5\' 9"  (175.3 cm) Weight: 212 lb 6.4 oz (96.3 kg) IBW/kg (Calculated) : 70.7 Heparin Dosing Weight: 90.8 kg  Vital Signs: Temp: 97.9 F (36.6 C) (08/02 1140) Temp Source: Oral (08/02 0955) BP: 159/59 (08/02 1140) Pulse Rate: 77 (08/02 1140)  Labs:  Recent Labs  10/30/16 2017 10/31/16 0221 10/31/16 0441 10/31/16 0843 10/31/16 1309  HGB 10.3*  --   --  10.0*  --   HCT 29.9*  --   --  29.5*  --   PLT 151  --   --  147*  --   APTT  --   --  34  --   --   LABPROT  --   --  14.3  --   --   INR  --   --  1.11  --   --   HEPARINUNFRC  --   --   --   --  0.17*  CREATININE 2.66*  --   --  3.57*  --   TROPONINI 0.05* 1.48*  --  1.66* 1.45*    Estimated Creatinine Clearance: 19.5 mL/min (A) (by C-G formula based on SCr of 3.57 mg/dL (H)).   Medical History: Past Medical History:  Diagnosis Date  . Cancer [facility])    Prostate  . CHF (congestive heart failure) ([place])   . Chronic kidney disease   . Complication of anesthesia    hard to wake up  . COPD (chronic obstructive pulmonary disease) ([place])   . Diabetes mellitus without complication ([place])   . Difficult intubation   . Dyspnea   . GERD (gastroesophageal reflux disease)   . Hyperlipidemia   . Hypertension   . Pleural effusion   . Renal insufficiency     Medications:  Scheduled:  . amLODipine  5 mg Oral QHS  . aspirin  324 mg Oral Once  . aspirin EC  81 mg Oral Daily  . atorvastatin  20 mg Oral QHS  . calcium acetate  1,334 mg Oral TID WC  . carvedilol  6.25 mg Oral BID  . doxazosin  8 mg Oral QHS  . furosemide  40 mg Oral  BID  . insulin aspart  0-5 Units Subcutaneous QHS  . insulin aspart  0-9 Units Subcutaneous TID WC  . insulin detemir  10 Units Subcutaneous Daily  . isosorbide mononitrate  30 mg Oral Daily  . lisinopril  20 mg Oral QHS  . mouth rinse  15 mL Mouth Rinse BID  . pantoprazole  40 mg Oral Daily  . sodium chloride flush  3 mL Intravenous Q12H  . spironolactone  25 mg Oral BID    Assessment: Patient admitted for afib, but now found to have elevated trops to 1.48. Being started on heparin drip  Goal of Therapy:  Heparin level 0.3-0.7 units/ml Monitor platelets by anticoagulation protocol: Yes   Plan:  HL low at 0.17 Bolus 2700 units once Increase rate to 1300 units/hr Confirmed with RN that drip was not stopped except to draw blood and then resumed. Recheck level in 8 hours  Kaleigh Spiegelman D Eyvonne Burchfield, Pharm.D, BCPS Clinical Pharmacist  10/31/2016

## 2016-10-31 NOTE — Plan of Care (Signed)
Problem: Cardiac: Goal: Ability to achieve and maintain adequate cardiopulmonary perfusion will improve Outcome: Progressing No further ectopics this shift.  Tolerating activity at bedside without complaints of shortness of breath.

## 2016-10-31 NOTE — Progress Notes (Signed)
Inpatient Diabetes Program Recommendations  AACE/ADA: New Consensus Statement on Inpatient Glycemic Control (2015)  Target Ranges:  Prepandial:   less than 140 mg/dL      Peak postprandial:   less than 180 mg/dL (1-2 hours)      Critically ill patients:  140 - 180 mg/dL   Lab Results  Component Value Date   GLUCAP 181 (H) 10/22/2016    Diabetes history: Type 2 diabetes with CKD Outpatient Diabetes medications:  Humalog 10 units tid with meals Current orders for Inpatient glycemic control:  None Inpatient Diabetes Program Recommendations:   Please consider adding Novolog sensitive correction tid with meals and Levemir 10 units daily (text page sent).  Also may need Novolog meal coverage 3 units tid with meals if post=prandial blood sugars increased.   Thanks, Adah Perl, RN, BC-ADM Inpatient Diabetes Coordinator Pager [phone] (8a-5p)

## 2016-10-31 NOTE — Discharge Summary (Addendum)
[facility] [place] NAME: Calvin Good    MR#:  [id number]  DATE OF BIRTH:  [birth year]  DATE OF ADMISSION:  10/30/2016   ADMITTING PHYSICIAN: Saundra Shelling, MD  DATE OF DISCHARGE:10/31/2016 PRIMARY CARE PHYSICIAN: [name], MD   ADMISSION DIAGNOSIS:  Elevated troponin [R74.8] Atrial fibrillation, unspecified type ([place]) [I48.91] DISCHARGE DIAGNOSIS:  Active Problems:   A-fib ([place])  SECONDARY DIAGNOSIS:   Past Medical History:  Diagnosis Date  . Cancer [facility])    Prostate  . CHF (congestive heart failure) ([place])   . Chronic kidney disease   . Complication of anesthesia    hard to wake up  . COPD (chronic obstructive pulmonary disease) ([place])   . Diabetes mellitus without complication ([place])   . Difficult intubation   . Dyspnea   . GERD (gastroesophageal reflux disease)   . Hyperlipidemia   . Hypertension   . Pleural effusion   . Renal insufficiency    HOSPITAL COURSE:   76 year old male patient with history of COPD on home oxygen, diabetes. Will, end-stage renal disease on dialysis, prostate cancer, hyperlipidemia, GERD, hypertension presented to the emergency room with nausea and weakness. Patient was found to be in atrial fibrillation when he presented to the emergency room with rapid rate. Rate was controlled with IV metoprolol.  1. Atrial fibrillation RVR, rated is controlled with oral beta blocker.  2. Elevated troponin be secondary to demand ischemia.  He has no complaints. He is on heparin drip, troponin is elevated up to 1.66. Cardiologist Dr. Clayborn Bigness doesn't think it is non-STEMI and suggested discontinuing heparin drip and discharge the patient home.  3. End-stage renal disease on dialysis 4. Emphysema 5. Prostate cancer 6. Hyperlipidemia  Discussed with Dr. Candiss Norse and Dr. Clayborn Bigness. The patient is a home health and physical therapy. In addition, palliative referral.. DISCHARGE CONDITIONS:  Stable,  discharge to home with home health and physical therapy today. CONSULTS OBTAINED:  Treatment Team:  Murlean Iba, MD [name], MD DRUG ALLERGIES:   Allergies  Allergen Reactions  . Tetracyclines & Related Other (See Comments)    Reaction: unknown happened a long time ago and pt doesn't remember reaction   . Morphine Other (See Comments)    Other reaction(s): disoriented Reaction: confusion   DISCHARGE MEDICATIONS:   Allergies as of 10/31/2016      Reactions   Tetracyclines & Related Other (See Comments)   Reaction: unknown happened a long time ago and pt doesn't remember reaction    Morphine Other (See Comments)   Other reaction(s): disoriented Reaction: confusion      Medication List    TAKE these medications   acetaminophen 325 MG tablet Commonly known as:  TYLENOL Take 2 tablets (650 mg total) by mouth every 6 (six) hours as needed for mild pain (or Fever >/= 101).   amLODipine 5 MG tablet Commonly known as:  NORVASC Take 5 mg by mouth at bedtime.   aspirin EC 81 MG tablet Take 81 mg by mouth daily.   atorvastatin 20 MG tablet Commonly known as:  LIPITOR Take 20 mg by mouth at bedtime.   calcium acetate 667 MG capsule Commonly known as:  PHOSLO Take 1,334 mg by mouth 3 (three) times daily with meals.   carvedilol 6.25 MG tablet Commonly known as:  COREG Take 6.25 mg by mouth 2 (two) times daily. 8 am, 5 pm   dextrose 40 % Gel Commonly known as:  GLUTOSE Take  1 Tube by mouth every 30 (thirty) minutes as needed for low blood sugar. Or until BS > 70 and pt is still responsive.   doxazosin 8 MG tablet Commonly known as:  CARDURA Take 8 mg by mouth at bedtime.   furosemide 40 MG tablet Commonly known as:  LASIX Take 40 mg by mouth 2 (two) times daily.   GLUCAGON EMERGENCY 1 MG injection Generic drug:  glucagon Inject 1 mg into the muscle once as needed. If not responding to oral glucose, snack. May repeat x 1 after 15 minutes. Use only if pt  is or is becoming unresponsive as needed.   insulin lispro 100 UNIT/ML injection Commonly known as:  HUMALOG Inject 10 Units into the skin 3 (three) times daily before meals.   ipratropium-albuterol 0.5-2.5 (3) MG/3ML Soln Commonly known as:  DUONEB Take 3 mLs by nebulization every 6 (six) hours as needed. What changed:  reasons to take this   isosorbide mononitrate 30 MG 24 hr tablet Commonly known as:  IMDUR Take 1 tablet (30 mg total) by mouth daily. What changed:  how much to take   Lidocaine-Prilocaine (Bulk) 2.5-2.5 % Crea Apply 1 application topically as needed. Apply to fistula site prior to dialysis treatments as needed   lisinopril 20 MG tablet Commonly known as:  PRINIVIL,ZESTRIL Take 20 mg by mouth at bedtime.   omeprazole 20 MG capsule Commonly known as:  PRILOSEC Take 20 mg by mouth 2 (two) times daily before a meal.   ondansetron 4 MG tablet Commonly known as:  ZOFRAN Take 1 tablet (4 mg total) by mouth every 6 (six) hours as needed for nausea.   OXYGEN Inhale 2 L into the lungs continuous.   spironolactone 25 MG tablet Commonly known as:  ALDACTONE Take 25 mg by mouth 2 (two) times daily.        DISCHARGE INSTRUCTIONS:  See AVS. If you experience worsening of your admission symptoms, develop shortness of breath, life threatening emergency, suicidal or homicidal thoughts you must seek medical attention immediately by calling 911 or calling your MD immediately  if symptoms less severe.  You Must read complete instructions/literature along with all the possible adverse reactions/side effects for all the Medicines you take and that have been prescribed to you. Take any new Medicines after you have completely understood and accpet all the possible adverse reactions/side effects.   Please note  You were cared for by a hospitalist during your hospital stay. If you have any questions about your discharge medications or the care you received while you were in  the hospital after you are discharged, you can call the unit and asked to speak with the hospitalist on call if the hospitalist that took care of you is not available. Once you are discharged, your primary care physician will handle any further medical issues. Please note that NO REFILLS for any discharge medications will be authorized once you are discharged, as it is imperative that you return to your primary care physician (or establish a relationship with a primary care physician if you do not have one) for your aftercare needs so that they can reassess your need for medications and monitor your lab values.    On the day of Discharge:  VITAL SIGNS:  Blood pressure (!) 159/59, pulse 77, temperature 97.9 F (36.6 C), resp. rate 14, height 5\' 9"  (1.753 m), weight 212 lb 6.4 oz (96.3 kg), SpO2 97 %. PHYSICAL EXAMINATION:  GENERAL:  76 y.o.-year-old patient lying in the  bed with no acute distress.  EYES: Pupils equal, round, reactive to light and accommodation. No scleral icterus. Extraocular muscles intact.  HEENT: Head atraumatic, normocephalic. Oropharynx and nasopharynx clear.  NECK:  Supple, no jugular venous distention. No thyroid enlargement, no tenderness.  LUNGS: Normal breath sounds bilaterally, no wheezing, rales,rhonchi or crepitation. No use of accessory muscles of respiration.  CARDIOVASCULAR: S1, S2 normal. No murmurs, rubs, or gallops.  ABDOMEN: Soft, non-tender, non-distended. Bowel sounds present. No organomegaly or mass.  EXTREMITIES: No pedal edema, cyanosis, or clubbing.  NEUROLOGIC: Cranial nerves II through XII are intact. Muscle strength 5/5 in all extremities. Sensation intact. Gait not checked.  PSYCHIATRIC: The patient is alert and oriented x 3.  SKIN: No obvious rash, lesion, or ulcer.  DATA REVIEW:   CBC  Recent Labs Lab 10/31/16 0843  WBC 7.7  HGB 10.0*  HCT 29.5*  PLT 147*    Chemistries   Recent Labs Lab 10/31/16 0843  NA 135  K 3.8  CL 97*  CO2  30  GLUCOSE 291*  BUN 23*  CREATININE 3.57*  CALCIUM 8.4*     Microbiology Results  Results for orders placed or performed during the hospital encounter of 10/16/16  Urine culture     Status: Abnormal   Collection Time: 10/17/16  6:43 AM  Result Value Ref Range Status   Specimen Description URINE, RANDOM  Final   Special Requests NONE  Final   Culture (A)  Final    <10,000 COLONIES/mL INSIGNIFICANT GROWTH Performed at [facility], [place] [address] [place], [place] [address]    Report Status 10/18/2016 FINAL  Final  MRSA PCR Screening     Status: None   Collection Time: 10/17/16  6:36 PM  Result Value Ref Range Status   MRSA by PCR NEGATIVE NEGATIVE Final    Comment:        The GeneXpert MRSA Assay (FDA approved for NASAL specimens only), is one component of a comprehensive MRSA colonization surveillance program. It is not intended to diagnose MRSA infection nor to guide or monitor treatment for MRSA infections.     RADIOLOGY:  No results found.   Management plans discussed with the patient, family and they are in agreement.  CODE STATUS: Full Code   TOTAL TIME TAKING CARE OF THIS PATIENT: 35 minutes.    Demetrios Loll M.D on 10/31/2016 at 3:31 PM  Between 7am to 6pm - Pager - [phone]  After 6pm go to [email or web address] - [employer] [facility]  Office  [phone]  CC: Primary care physician; [name], MD   Note: This dictation was prepared with Dragon dictation along with smaller phrase technology. Any transcriptional errors that result from this process are unintentional.

## 2016-10-31 NOTE — Care Management (Signed)
Notified [place] and Elvera Bicker with [facility] [employer] of discharge.  Requested home health orders from attending

## 2016-11-07 ENCOUNTER — Encounter (INDEPENDENT_AMBULATORY_CARE_PROVIDER_SITE_OTHER): Payer: Medicare Other

## 2016-11-07 ENCOUNTER — Ambulatory Visit (INDEPENDENT_AMBULATORY_CARE_PROVIDER_SITE_OTHER): Payer: Medicare Other

## 2016-11-07 ENCOUNTER — Ambulatory Visit (INDEPENDENT_AMBULATORY_CARE_PROVIDER_SITE_OTHER): Payer: Medicare Other | Admitting: Vascular Surgery

## 2016-11-07 DIAGNOSIS — T81718A Complication of other artery following a procedure, not elsewhere classified, initial encounter: Secondary | ICD-10-CM

## 2016-11-07 DIAGNOSIS — I9789 Other postprocedural complications and disorders of the circulatory system, not elsewhere classified: Secondary | ICD-10-CM

## 2016-11-07 DIAGNOSIS — I729 Aneurysm of unspecified site: Secondary | ICD-10-CM

## 2016-11-14 ENCOUNTER — Telehealth (INDEPENDENT_AMBULATORY_CARE_PROVIDER_SITE_OTHER): Payer: Self-pay | Admitting: Vascular Surgery

## 2016-11-14 NOTE — Telephone Encounter (Signed)
I called the patient to discuss his recent noninvasive studies.   My call went to his answering machine. He identified himself by name and therefore I went ahead and left a brief description of his results and asked that he contact the office so that we could discuss them further.

## 2016-11-25 ENCOUNTER — Emergency Department: Payer: Medicare Other

## 2016-11-25 ENCOUNTER — Inpatient Hospital Stay
Admission: EM | Admit: 2016-11-25 | Discharge: 2016-11-27 | DRG: 308 | Disposition: A | Discharge status: Home Health | Payer: Medicare Other | Attending: Internal Medicine | Admitting: Internal Medicine

## 2016-11-25 ENCOUNTER — Ambulatory Visit: Payer: Medicare Other | Admitting: Podiatry

## 2016-11-25 DIAGNOSIS — J449 Chronic obstructive pulmonary disease, unspecified: Secondary | ICD-10-CM | POA: Diagnosis present

## 2016-11-25 DIAGNOSIS — I132 Hypertensive heart and chronic kidney disease with heart failure and with stage 5 chronic kidney disease, or end stage renal disease: Secondary | ICD-10-CM | POA: Diagnosis present

## 2016-11-25 DIAGNOSIS — N2581 Secondary hyperparathyroidism of renal origin: Secondary | ICD-10-CM | POA: Diagnosis present

## 2016-11-25 DIAGNOSIS — Z9981 Dependence on supplemental oxygen: Secondary | ICD-10-CM | POA: Diagnosis not present

## 2016-11-25 DIAGNOSIS — E785 Hyperlipidemia, unspecified: Secondary | ICD-10-CM | POA: Diagnosis present

## 2016-11-25 DIAGNOSIS — Z79899 Other long term (current) drug therapy: Secondary | ICD-10-CM | POA: Diagnosis not present

## 2016-11-25 DIAGNOSIS — K625 Hemorrhage of anus and rectum: Secondary | ICD-10-CM | POA: Diagnosis not present

## 2016-11-25 DIAGNOSIS — I5042 Chronic combined systolic (congestive) and diastolic (congestive) heart failure: Secondary | ICD-10-CM | POA: Diagnosis present

## 2016-11-25 DIAGNOSIS — K219 Gastro-esophageal reflux disease without esophagitis: Secondary | ICD-10-CM | POA: Diagnosis present

## 2016-11-25 DIAGNOSIS — I480 Paroxysmal atrial fibrillation: Secondary | ICD-10-CM | POA: Diagnosis present

## 2016-11-25 DIAGNOSIS — Z87891 Personal history of nicotine dependence: Secondary | ICD-10-CM

## 2016-11-25 DIAGNOSIS — K921 Melena: Secondary | ICD-10-CM | POA: Diagnosis present

## 2016-11-25 DIAGNOSIS — R0789 Other chest pain: Secondary | ICD-10-CM | POA: Diagnosis present

## 2016-11-25 DIAGNOSIS — Z8546 Personal history of malignant neoplasm of prostate: Secondary | ICD-10-CM | POA: Diagnosis not present

## 2016-11-25 DIAGNOSIS — R079 Chest pain, unspecified: Secondary | ICD-10-CM

## 2016-11-25 DIAGNOSIS — J9610 Chronic respiratory failure, unspecified whether with hypoxia or hypercapnia: Secondary | ICD-10-CM | POA: Diagnosis present

## 2016-11-25 DIAGNOSIS — D631 Anemia in chronic kidney disease: Secondary | ICD-10-CM | POA: Diagnosis present

## 2016-11-25 DIAGNOSIS — Z992 Dependence on renal dialysis: Secondary | ICD-10-CM

## 2016-11-25 DIAGNOSIS — Z794 Long term (current) use of insulin: Secondary | ICD-10-CM

## 2016-11-25 DIAGNOSIS — Z7982 Long term (current) use of aspirin: Secondary | ICD-10-CM

## 2016-11-25 DIAGNOSIS — E1122 Type 2 diabetes mellitus with diabetic chronic kidney disease: Secondary | ICD-10-CM | POA: Diagnosis present

## 2016-11-25 DIAGNOSIS — I4891 Unspecified atrial fibrillation: Secondary | ICD-10-CM | POA: Diagnosis present

## 2016-11-25 DIAGNOSIS — R52 Pain, unspecified: Secondary | ICD-10-CM

## 2016-11-25 DIAGNOSIS — N186 End stage renal disease: Secondary | ICD-10-CM | POA: Diagnosis present

## 2016-11-25 DIAGNOSIS — J90 Pleural effusion, not elsewhere classified: Secondary | ICD-10-CM

## 2016-11-25 DIAGNOSIS — I2510 Atherosclerotic heart disease of native coronary artery without angina pectoris: Secondary | ICD-10-CM | POA: Diagnosis present

## 2016-11-25 DIAGNOSIS — T45515A Adverse effect of anticoagulants, initial encounter: Secondary | ICD-10-CM | POA: Diagnosis present

## 2016-11-25 DIAGNOSIS — Z9079 Acquired absence of other genital organ(s): Secondary | ICD-10-CM

## 2016-11-25 LAB — TROPONIN I
TROPONIN I: 0.03 ng/mL — AB (ref <0.03)
Troponin I: 0.82 ng/mL (ref <0.03)

## 2016-11-25 LAB — CBC
HCT: 30.2 % — ABNORMAL LOW (ref 40.0–52.0)
HEMOGLOBIN: 10.5 g/dL — AB (ref 13.0–18.0)
MCH: 30.1 pg (ref 26.0–34.0)
MCHC: 34.9 g/dL (ref 32.0–36.0)
MCV: 86.5 fL (ref 80.0–100.0)
Platelets: 154 10*3/uL (ref 150–440)
RBC: 3.5 MIL/uL — AB (ref 4.40–5.90)
RDW: 15.4 % — ABNORMAL HIGH (ref 11.5–14.5)
WBC: 10.7 10*3/uL — ABNORMAL HIGH (ref 3.8–10.6)

## 2016-11-25 LAB — HEMOGLOBIN A1C
Hgb A1c MFr Bld: 8.4 % — ABNORMAL HIGH (ref 4.8–5.6)
Mean Plasma Glucose: 194.38 mg/dL

## 2016-11-25 LAB — BASIC METABOLIC PANEL
ANION GAP: 12 (ref 5–15)
BUN: 63 mg/dL — ABNORMAL HIGH (ref 6–20)
CALCIUM: 9.1 mg/dL (ref 8.9–10.3)
CHLORIDE: 98 mmol/L — AB (ref 101–111)
CO2: 27 mmol/L (ref 22–32)
Creatinine, Ser: 5.93 mg/dL — ABNORMAL HIGH (ref 0.61–1.24)
GFR calc non Af Amer: 8 mL/min — ABNORMAL LOW (ref >60)
GFR, EST AFRICAN AMERICAN: 9 mL/min — AB (ref >60)
Glucose, Bld: 326 mg/dL — ABNORMAL HIGH (ref 65–99)
POTASSIUM: 4.2 mmol/L (ref 3.5–5.1)
Sodium: 137 mmol/L (ref 135–145)

## 2016-11-25 LAB — MRSA PCR SCREENING: MRSA by PCR: NEGATIVE

## 2016-11-25 LAB — GLUCOSE, CAPILLARY
Glucose-Capillary: 342 mg/dL — ABNORMAL HIGH (ref 65–99)
Glucose-Capillary: 319 mg/dL — ABNORMAL HIGH (ref 65–99)
Glucose-Capillary: 304 mg/dL — ABNORMAL HIGH (ref 65–99)
Glucose-Capillary: 264 mg/dL — ABNORMAL HIGH (ref 65–99)
Glucose-Capillary: 168 mg/dL — ABNORMAL HIGH (ref 65–99)

## 2016-11-25 LAB — TSH: TSH: 2.458 u[IU]/mL (ref 0.350–4.500)

## 2016-11-25 LAB — PHOSPHORUS: PHOSPHORUS: 4.8 mg/dL — AB (ref 2.5–4.6)

## 2016-11-25 MED ORDER — SPIRONOLACTONE 25 MG PO TABS
25.0000 mg | ORAL_TABLET | Freq: Two times a day (BID) | ORAL | Status: DC
  Administered 2016-11-25 – 2016-11-26 (×3): 25 mg via ORAL
  Filled 2016-11-25 (×4): qty 1

## 2016-11-25 MED ORDER — DILTIAZEM HCL 100 MG IV SOLR
5.0000 mg/h | INTRAVENOUS | Status: DC
  Administered 2016-11-25: 5 mg/h via INTRAVENOUS
  Filled 2016-11-25: qty 100

## 2016-11-25 MED ORDER — CALCIUM ACETATE (PHOS BINDER) 667 MG PO CAPS
1334.0000 mg | ORAL_CAPSULE | Freq: Three times a day (TID) | ORAL | Status: DC
  Administered 2016-11-25 – 2016-11-26 (×3): 1334 mg via ORAL
  Administered 2016-11-26: 667 mg via ORAL
  Filled 2016-11-25 (×5): qty 2

## 2016-11-25 MED ORDER — HEPARIN SODIUM (PORCINE) 5000 UNIT/ML IJ SOLN: 5000.0000 [IU] | Freq: Three times a day (TID) | INTRAMUSCULAR | Status: DC

## 2016-11-25 MED ORDER — HEPARIN BOLUS VIA INFUSION
4000.0000 [IU] | Freq: Once | INTRAVENOUS | Status: AC
  Administered 2016-11-25: 4000 [IU] via INTRAVENOUS
  Filled 2016-11-25: qty 4000

## 2016-11-25 MED ORDER — ONDANSETRON HCL 4 MG/2ML IJ SOLN
4.0000 mg | Freq: Once | INTRAMUSCULAR | Status: AC
  Administered 2016-11-25: 4 mg via INTRAVENOUS
  Filled 2016-11-25: qty 2

## 2016-11-25 MED ORDER — LISINOPRIL 20 MG PO TABS
20.0000 mg | ORAL_TABLET | Freq: Every day | ORAL | Status: DC
  Administered 2016-11-25 – 2016-11-26 (×2): 20 mg via ORAL
  Filled 2016-11-25 (×2): qty 1

## 2016-11-25 MED ORDER — ISOSORBIDE MONONITRATE ER 30 MG PO TB24
30.0000 mg | ORAL_TABLET | Freq: Every day | ORAL | Status: DC
Start: 2016-11-25 — End: 2016-11-27
  Administered 2016-11-25 – 2016-11-27 (×3): 30 mg via ORAL
  Filled 2016-11-25 (×3): qty 1

## 2016-11-25 MED ORDER — HEPARIN (PORCINE) IN NACL 100-0.45 UNIT/ML-% IJ SOLN
1200.0000 [IU]/h | INTRAMUSCULAR | Status: DC
  Administered 2016-11-25: 1200 [IU]/h via INTRAVENOUS
  Filled 2016-11-25: qty 250

## 2016-11-25 MED ORDER — ONDANSETRON HCL 4 MG/2ML IJ SOLN
4.0000 mg | INTRAMUSCULAR | Status: AC
  Administered 2016-11-25: 4 mg via INTRAVENOUS
  Filled 2016-11-25: qty 2

## 2016-11-25 MED ORDER — FUROSEMIDE 40 MG PO TABS
40.0000 mg | ORAL_TABLET | Freq: Two times a day (BID) | ORAL | Status: DC
  Administered 2016-11-25 – 2016-11-26 (×3): 40 mg via ORAL
  Filled 2016-11-25 (×4): qty 1

## 2016-11-25 MED ORDER — AMLODIPINE BESYLATE 5 MG PO TABS
5.0000 mg | ORAL_TABLET | Freq: Every day | ORAL | Status: DC
  Administered 2016-11-25 – 2016-11-26 (×2): 5 mg via ORAL
  Filled 2016-11-25 (×2): qty 1

## 2016-11-25 MED ORDER — PANTOPRAZOLE SODIUM 40 MG PO TBEC
40.0000 mg | DELAYED_RELEASE_TABLET | Freq: Every day | ORAL | Status: DC
  Administered 2016-11-25 – 2016-11-26 (×2): 40 mg via ORAL
  Filled 2016-11-25 (×2): qty 1

## 2016-11-25 MED ORDER — ASPIRIN EC 81 MG PO TBEC
81.0000 mg | DELAYED_RELEASE_TABLET | Freq: Every day | ORAL | Status: DC
  Administered 2016-11-25 – 2016-11-27 (×3): 81 mg via ORAL
  Filled 2016-11-25 (×3): qty 1

## 2016-11-25 MED ORDER — METOPROLOL TARTRATE 5 MG/5ML IV SOLN
5.0000 mg | Freq: Once | INTRAVENOUS | Status: AC
  Administered 2016-11-25: 5 mg via INTRAVENOUS
  Filled 2016-11-25: qty 5

## 2016-11-25 MED ORDER — WARFARIN SODIUM 5 MG PO TABS
5.0000 mg | ORAL_TABLET | Freq: Every day | ORAL | Status: DC
  Filled 2016-11-25: qty 1

## 2016-11-25 MED ORDER — DOXAZOSIN MESYLATE 4 MG PO TABS
8.0000 mg | ORAL_TABLET | Freq: Every day | ORAL | Status: DC
  Administered 2016-11-25 – 2016-11-26 (×2): 8 mg via ORAL
  Filled 2016-11-25: qty 1
  Filled 2016-11-25 (×2): qty 2

## 2016-11-25 MED ORDER — ACETAMINOPHEN 650 MG RE SUPP: 650.0000 mg | Freq: Four times a day (QID) | RECTAL | Status: DC | PRN

## 2016-11-25 MED ORDER — INSULIN ASPART 100 UNIT/ML ~~LOC~~ SOLN
0.0000 [IU] | Freq: Three times a day (TID) | SUBCUTANEOUS | Status: DC
  Administered 2016-11-25 (×2): 7 [IU] via SUBCUTANEOUS
  Administered 2016-11-26: 3 [IU] via SUBCUTANEOUS
  Administered 2016-11-26: 2 [IU] via SUBCUTANEOUS
  Filled 2016-11-25 (×4): qty 1

## 2016-11-25 MED ORDER — IPRATROPIUM-ALBUTEROL 0.5-2.5 (3) MG/3ML IN SOLN: 3.0000 mL | Freq: Four times a day (QID) | RESPIRATORY_TRACT | Status: DC | PRN

## 2016-11-25 MED ORDER — WARFARIN - PHYSICIAN DOSING INPATIENT
Freq: Every day | Status: DC
Start: 2016-11-25 — End: 2016-11-25

## 2016-11-25 MED ORDER — APIXABAN 2.5 MG PO TABS: 2.5000 mg | ORAL_TABLET | Freq: Two times a day (BID) | ORAL | Status: DC

## 2016-11-25 MED ORDER — ATORVASTATIN CALCIUM 20 MG PO TABS
20.0000 mg | ORAL_TABLET | Freq: Every day | ORAL | Status: DC
  Administered 2016-11-25 – 2016-11-26 (×2): 20 mg via ORAL
  Filled 2016-11-25 (×2): qty 1

## 2016-11-25 MED ORDER — ACETAMINOPHEN 325 MG PO TABS
650.0000 mg | ORAL_TABLET | Freq: Four times a day (QID) | ORAL | Status: DC | PRN
  Administered 2016-11-26: 650 mg via ORAL
  Filled 2016-11-25: qty 2

## 2016-11-25 MED ORDER — APIXABAN 5 MG PO TABS: 5.0000 mg | ORAL_TABLET | Freq: Two times a day (BID) | ORAL | Status: DC

## 2016-11-25 MED ORDER — INSULIN ASPART 100 UNIT/ML ~~LOC~~ SOLN
0.0000 [IU] | Freq: Every day | SUBCUTANEOUS | Status: DC
  Administered 2016-11-26: 3 [IU] via SUBCUTANEOUS
  Filled 2016-11-25: qty 1

## 2016-11-25 MED ORDER — CARVEDILOL 6.25 MG PO TABS
6.2500 mg | ORAL_TABLET | Freq: Two times a day (BID) | ORAL | Status: DC
  Administered 2016-11-25 – 2016-11-26 (×4): 6.25 mg via ORAL
  Filled 2016-11-25 (×4): qty 1

## 2016-11-25 MED ORDER — INSULIN DETEMIR 100 UNIT/ML ~~LOC~~ SOLN
20.0000 [IU] | Freq: Two times a day (BID) | SUBCUTANEOUS | Status: DC
  Administered 2016-11-25 – 2016-11-26 (×2): 20 [IU] via SUBCUTANEOUS
  Filled 2016-11-25 (×4): qty 0.2

## 2016-11-25 NOTE — Progress Notes (Addendum)
ANTICOAGULATION CONSULT NOTE - Initial Consult  Pharmacy Consult for Heparin  Indication: chest pain/ACS  Allergies  Allergen Reactions  . Tetracyclines & Related Other (See Comments)    Reaction: unknown happened a long time ago and pt doesn't remember reaction   . Morphine Other (See Comments)    Other reaction(s): disoriented Reaction: confusion    Patient Measurements: Height: 5\' 9"  (175.3 cm) Weight: 213 lb 4.8 oz (96.8 kg) IBW/kg (Calculated) : 70.7 Heparin Dosing Weight: 91.6 kg   Vital Signs: Temp: 98 F (36.7 C) (08/27 2238) Temp Source: Oral (08/27 2238) BP: 168/58 (08/27 2238) Pulse Rate: 95 (08/27 2238)  Labs:  Recent Labs  11/25/16 0316 11/25/16 1919  HGB 10.5*  --   HCT 30.2*  --   PLT 154  --   CREATININE 5.93*  --   TROPONINI 0.03* 0.82*    Estimated Creatinine Clearance: 11.8 mL/min (A) (by C-G formula based on SCr of 5.93 mg/dL (H)).   Medical History: Past Medical History:  Diagnosis Date  . Cancer [facility])    Prostate  . CHF (congestive heart failure) ([place])   . Chronic kidney disease   . Complication of anesthesia    hard to wake up  . COPD (chronic obstructive pulmonary disease) ([place])   . Diabetes mellitus without complication ([place])   . Difficult intubation   . Dyspnea   . GERD (gastroesophageal reflux disease)   . Hyperlipidemia   . Hypertension   . Pleural effusion   . Renal insufficiency     Medications:  Prescriptions Prior to Admission  Medication Sig Dispense Refill Last Dose  . acetaminophen (TYLENOL) 325 MG tablet Take 2 tablets (650 mg total) by mouth every 6 (six) hours as needed for mild pain (or Fever >/= 101).   prn at prn  . amLODipine (NORVASC) 5 MG tablet Take 5 mg by mouth at bedtime.    11/24/2016 at Unknown time  . aspirin EC 81 MG tablet Take 81 mg by mouth daily.   11/24/2016 at Unknown time  . atorvastatin (LIPITOR) 20 MG tablet Take 20 mg by mouth at bedtime.    11/24/2016 at Unknown time  . calcium acetate  (PHOSLO) 667 MG capsule Take 1,334 mg by mouth 3 (three) times daily with meals.    11/24/2016 at Unknown time  . carvedilol (COREG) 6.25 MG tablet Take 6.25 mg by mouth 2 (two) times daily. 8 am, 5 pm   11/24/2016 at Unknown time  . dextrose (GLUTOSE) 40 % GEL Take 1 Tube by mouth every 30 (thirty) minutes as needed for low blood sugar. Or until BS > 70 and pt is still responsive.   prn at prn  . doxazosin (CARDURA) 8 MG tablet Take 8 mg by mouth at bedtime.    11/24/2016 at Unknown time  . furosemide (LASIX) 40 MG tablet Take 40 mg by mouth 2 (two) times daily.    11/24/2016 at Unknown time  . glucagon (GLUCAGON EMERGENCY) 1 MG injection Inject 1 mg into the muscle once as needed. If not responding to oral glucose, snack. May repeat x 1 after 15 minutes. Use only if pt is or is becoming unresponsive as needed.   prn at prn  . insulin lispro (HUMALOG) 100 UNIT/ML injection Inject 10 Units into the skin 3 (three) times daily before meals.   11/24/2016 at Unknown time  . ipratropium-albuterol (DUONEB) 0.5-2.5 (3) MG/3ML SOLN Take 3 mLs by nebulization every 6 (six) hours as needed. (Patient taking differently:  Take 3 mLs by nebulization every 6 (six) hours as needed (shortness of breath). ) 360 mL  prn at prn  . isosorbide mononitrate (IMDUR) 30 MG 24 hr tablet Take 1 tablet (30 mg total) by mouth daily. (Patient taking differently: Take 15 mg by mouth daily. ) 60 tablet 0 11/24/2016 at Unknown time  . Lidocaine-Prilocaine, Bulk, 2.5-2.5 % CREA Apply 1 application topically as needed. Apply to fistula site prior to dialysis treatments as needed   prn at prn  . lisinopril (PRINIVIL,ZESTRIL) 20 MG tablet Take 20 mg by mouth at bedtime.    11/24/2016 at Unknown time  . omeprazole (PRILOSEC) 20 MG capsule Take 20 mg by mouth 2 (two) times daily before a meal.    11/24/2016 at Unknown time  . OXYGEN Inhale 2 L into the lungs continuous.   11/25/2016 at Unknown time  . spironolactone (ALDACTONE) 25 MG tablet Take 25 mg  by mouth 2 (two) times daily.    11/24/2016 at Unknown time    Assessment: Pharmacy consulted to dose heparin in this 76 year old male with ACS/NSTEMI.  CrCl = 11.8 ml/min.   No prior anticoag noted.   Goal of Therapy:  INR 2-3 Monitor platelets by anticoagulation protocol: Yes   Plan:  Give 4000 units bolus x 1 Start heparin infusion at 1200 units/hr Check anti-Xa level in 8 hours and daily while on heparin Continue to monitor H&H and platelets  Nadina Fomby D 11/25/2016,11:16 PM

## 2016-11-25 NOTE — ED Notes (Signed)
Dr Marcille Blanco in to see pt fir admission.

## 2016-11-25 NOTE — Progress Notes (Signed)
Pre-hemodialysis 

## 2016-11-25 NOTE — H&P (Signed)
Calvin Good is an 76 y.o. male.   Chief Complaint: chest pain HPI: the patient with past medical history of CHF, COPD, diabetes, hypertension and renal failure presents emergency department complaining of chest pain. Notably the patient states that he felt more palpitations than pain. He describes the discomfort in his chest as an uneasy feeling. The patient admits to nausea but denies pressure or stabbing pain, vomiting or diaphoresis.Telemetry showed atrial fibrillation with rapid ventricular rate. He had been admitted for the same approximately 2 weeks ago. He is not yet on anticoagulation as his cardiology team is discussing the appropriate therapy. The patient did not convert in the emergency department with antiarrhythmics which prompted the emergency department staff to call the hospitalist service for admission.  Past Medical History:  Diagnosis Date  . Cancer [facility])    Prostate  . CHF (congestive heart failure) ([place])   . Chronic kidney disease   . Complication of anesthesia    hard to wake up  . COPD (chronic obstructive pulmonary disease) ([place])   . Diabetes mellitus without complication ([place])   . Difficult intubation   . Dyspnea   . GERD (gastroesophageal reflux disease)   . Hyperlipidemia   . Hypertension   . Pleural effusion   . Renal insufficiency     Past Surgical History:  Procedure Laterality Date  . APPENDECTOMY    . AV FISTULA PLACEMENT Left 05/30/2016   Procedure: ARTERIOVENOUS (AV) FISTULA CREATION ( BRACHIOCEPHALIC );  Surgeon: [name], MD;  Location: [facility] ORS;  Service: Vascular;  Laterality: Left;  . CAPD INSERTION N/A 05/30/2016   Procedure: LAPAROSCOPIC INSERTION CONTINUOUS AMBULATORY PERITONEAL DIALYSIS  (CAPD) CATHETER;  Surgeon: [name], MD;  Location: [facility] ORS;  Service: Vascular;  Laterality: N/A;  . DIALYSIS/PERMA CATHETER INSERTION    . DIALYSIS/PERMA CATHETER REMOVAL N/A 08/21/2016   Procedure: Dialysis/Perma Catheter Removal;  Surgeon: [name], MD;  Location: [place] CV LAB;  Service: Cardiovascular;  Laterality: N/A;  . JOINT REPLACEMENT     right knee   . LEFT HEART CATH AND CORONARY ANGIOGRAPHY N/A 08/21/2016   Procedure: Left Heart Cath and Coronary Angiography possible PCI;  Surgeon: [name], MD;  Location: [place] CV LAB;  Service: Cardiovascular;  Laterality: N/A;  . postate removal     . PROSTATE SURGERY    . PSEUDOANERYSM COMPRESSION Right 08/27/2016   Procedure: Pseudoanerysm Compression;  Surgeon: Katha Cabal, MD;  Location: [place] CV LAB;  Service: Cardiovascular;  Laterality: Right;  . REMOVAL OF A DIALYSIS CATHETER N/A 08/08/2016   Procedure: REMOVAL OF A DIALYSIS CATHETER;  Surgeon: [name], MD;  Location: [facility] ORS;  Service: Vascular;  Laterality: N/A;    Family History  Problem Relation Age of Onset  . Colon cancer Father   . Brain cancer Mother   . CAD Neg Hx   . Diabetes Neg Hx    Social History:  reports that he quit smoking about 34 years ago. He has never used smokeless tobacco. He reports that he drinks about 1.8 oz of alcohol per week . He reports that he does not use drugs.  Allergies:  Allergies  Allergen Reactions  . Tetracyclines & Related Other (See Comments)    Reaction: unknown happened a long time ago and pt doesn't remember reaction   . Morphine Other (See Comments)    Other reaction(s): disoriented Reaction: confusion    Medications Prior to Admission  Medication Sig Dispense Refill  .  acetaminophen (TYLENOL) 325 MG tablet Take 2 tablets (650 mg total) by mouth every 6 (six) hours as needed for mild pain (or Fever >/= 101).    Marland Kitchen amLODipine (NORVASC) 5 MG tablet Take 5 mg by mouth at bedtime.     Marland Kitchen aspirin EC 81 MG tablet Take 81 mg by mouth daily.    Marland Kitchen atorvastatin (LIPITOR) 20 MG tablet Take 20 mg by mouth at bedtime.     . calcium acetate (PHOSLO) 667 MG capsule Take 1,334 mg by mouth 3 (three) times daily with meals.     . carvedilol  (COREG) 6.25 MG tablet Take 6.25 mg by mouth 2 (two) times daily. 8 am, 5 pm    . dextrose (GLUTOSE) 40 % GEL Take 1 Tube by mouth every 30 (thirty) minutes as needed for low blood sugar. Or until BS > 70 and pt is still responsive.    . doxazosin (CARDURA) 8 MG tablet Take 8 mg by mouth at bedtime.     . furosemide (LASIX) 40 MG tablet Take 40 mg by mouth 2 (two) times daily.     Marland Kitchen glucagon (GLUCAGON EMERGENCY) 1 MG injection Inject 1 mg into the muscle once as needed. If not responding to oral glucose, snack. May repeat x 1 after 15 minutes. Use only if pt is or is becoming unresponsive as needed.    . insulin lispro (HUMALOG) 100 UNIT/ML injection Inject 10 Units into the skin 3 (three) times daily before meals.    Marland Kitchen ipratropium-albuterol (DUONEB) 0.5-2.5 (3) MG/3ML SOLN Take 3 mLs by nebulization every 6 (six) hours as needed. (Patient taking differently: Take 3 mLs by nebulization every 6 (six) hours as needed (shortness of breath). ) 360 mL   . isosorbide mononitrate (IMDUR) 30 MG 24 hr tablet Take 1 tablet (30 mg total) by mouth daily. (Patient taking differently: Take 15 mg by mouth daily. ) 60 tablet 0  . Lidocaine-Prilocaine, Bulk, 2.5-2.5 % CREA Apply 1 application topically as needed. Apply to fistula site prior to dialysis treatments as needed    . lisinopril (PRINIVIL,ZESTRIL) 20 MG tablet Take 20 mg by mouth at bedtime.     Marland Kitchen omeprazole (PRILOSEC) 20 MG capsule Take 20 mg by mouth 2 (two) times daily before a meal.     . OXYGEN Inhale 2 L into the lungs continuous.    Marland Kitchen spironolactone (ALDACTONE) 25 MG tablet Take 25 mg by mouth 2 (two) times daily.       Results for orders placed or performed during the hospital encounter of 11/25/16 (from the past 48 hour(s))  Basic metabolic panel     Status: Abnormal   Collection Time: 11/25/16  3:16 AM  Result Value Ref Range   Sodium 137 135 - 145 mmol/L   Potassium 4.2 3.5 - 5.1 mmol/L   Chloride 98 (L) 101 - 111 mmol/L   CO2 27 22 - 32  mmol/L   Glucose, Bld 326 (H) 65 - 99 mg/dL   BUN 63 (H) 6 - 20 mg/dL   Creatinine, Ser 5.93 (H) 0.61 - 1.24 mg/dL   Calcium 9.1 8.9 - 10.3 mg/dL   GFR calc non Af Amer 8 (L) >60 mL/min   GFR calc Af Amer 9 (L) >60 mL/min    Comment: (NOTE) The eGFR has been calculated using the CKD EPI equation. This calculation has not been validated in all clinical situations. eGFR's persistently <60 mL/min signify possible Chronic Kidney Disease.    Anion gap 12  5 - 15  CBC     Status: Abnormal   Collection Time: 11/25/16  3:16 AM  Result Value Ref Range   WBC 10.7 (H) 3.8 - 10.6 K/uL   RBC 3.50 (L) 4.40 - 5.90 MIL/uL   Hemoglobin 10.5 (L) 13.0 - 18.0 g/dL   HCT 30.2 (L) 40.0 - 52.0 %   MCV 86.5 80.0 - 100.0 fL   MCH 30.1 26.0 - 34.0 pg   MCHC 34.9 32.0 - 36.0 g/dL   RDW 15.4 (H) 11.5 - 14.5 %   Platelets 154 150 - 440 K/uL  Troponin I     Status: Abnormal   Collection Time: 11/25/16  3:16 AM  Result Value Ref Range   Troponin I 0.03 (HH) <0.03 ng/mL    Comment: CRITICAL RESULT CALLED TO, READ BACK BY AND VERIFIED WITH ANN CALES AT 0401 ON 11/25/16 RWW   Glucose, capillary     Status: Abnormal   Collection Time: 11/25/16  5:48 AM  Result Value Ref Range   Glucose-Capillary 342 (H) 65 - 99 mg/dL   Dg Chest Port 1 View  Result Date: 11/25/2016 CLINICAL DATA:  Chest pain. EXAM: PORTABLE CHEST 1 VIEW COMPARISON:  PET-CT 10/22/2016.  Chest radiograph 10/20/2016 FINDINGS: Right pleural effusion has increased from prior exam, now moderate in size. Small left pleural effusion on prior CT is not well seen radiographically. The heart is enlarged. Mild interstitial edema. Atherosclerosis of the aortic arch. No pneumothorax. IMPRESSION: Increased right pleural effusion, now moderate in size. Cardiomegaly with mild interstitial edema. Electronically Signed   By: Jeb Levering M.D.   On: 11/25/2016 03:44    Review of Systems  Constitutional: Negative for chills and fever.  HENT: Negative for sore  throat and tinnitus.   Eyes: Negative for blurred vision and redness.  Respiratory: Negative for cough and shortness of breath.   Cardiovascular: Positive for palpitations. Negative for chest pain, orthopnea and PND.  Gastrointestinal: Positive for nausea. Negative for abdominal pain, diarrhea and vomiting.  Genitourinary: Negative for dysuria, frequency and urgency.  Musculoskeletal: Negative for joint pain and myalgias.  Skin: Negative for rash.       No lesions  Neurological: Negative for speech change, focal weakness and weakness.  Endo/Heme/Allergies: Does not bruise/bleed easily.       No temperature intolerance  Psychiatric/Behavioral: Negative for depression and suicidal ideas.    Blood pressure (!) 138/47, pulse 84, temperature 97.6 F (36.4 C), temperature source Oral, resp. rate 20, height _0  (1.753 m), weight 99 kg (218 lb 4.1 oz), SpO2 98 %. Physical Exam  Vitals reviewed. Constitutional: He is oriented to person, place, and time. He appears well-developed and well-nourished. No distress.  HENT:  Head: Normocephalic and atraumatic.  Mouth/Throat: Oropharynx is clear and moist.  Eyes: Pupils are equal, round, and reactive to light. Conjunctivae and EOM are normal. No scleral icterus.  Neck: Normal range of motion. Neck supple. No JVD present. No tracheal deviation present. No thyromegaly present.  Cardiovascular: Normal rate, regular rhythm and normal heart sounds.  Exam reveals no gallop and no friction rub.   No murmur heard. Respiratory: Effort normal and breath sounds normal. No respiratory distress.  GI: Soft. Bowel sounds are normal. He exhibits no distension. There is no tenderness.  Genitourinary:  Genitourinary Comments: Deferred  Musculoskeletal: Normal range of motion. He exhibits no edema.  Lymphadenopathy:    He has no cervical adenopathy.  Neurological: He is alert and oriented to person, place, and time.  No cranial nerve deficit.  Skin: Skin is warm  and dry. No rash noted. No erythema.  Psychiatric: He has a normal mood and affect. His behavior is normal. Judgment and thought content normal.     Assessment/Plan This is a 75 year old male admitted for atrial fibrillation with rapid ventricular rate. 1. Atrial fibrillation: rapid ventricular rate; the patient is received 2 doses of Toprol IV push. I have started him on a Cardizem drip. He is currently back in normal sinus rhythm. Cardiology is currently discussing anticoagulant dosing and selection   2. Hypertension: Controlled for age; continue amlodipine, carvedilol, doxazosin, lisinopril and spironolactone. 3. ESRD: On dialysis; consult nephrology for continuation of dialysis 4. Diabetes mellitus type 2: Sliding suicidal hospitalized. 5. CHF: Diastolic; continue Lasix 6. DVT prophylaxis: Heparin 7. GI prophylaxis: Pantoprazole The patient is a full code. Time spent on admission and critical care approximately 45 minutes  Harrie Foreman, MD 11/25/2016, 6:21 AM

## 2016-11-25 NOTE — ED Notes (Signed)
Explained to patient the need for a second IV access site and pt is understanding.

## 2016-11-25 NOTE — Consult Note (Signed)
[facility] Cardiology  CARDIOLOGY CONSULT NOTE  Patient ID: Calvin Good MRN: [id number] DOB/AGE: Jul 15, 1940 76 y.o.  Admit date: 11/25/2016 Referring Physician Tressia Miners Primary Physician [facility] Primary Cardiologist [facility] Reason for Consultation Atrial fibrillation with RVR  HPI: 76 year old male referred for evaluation of atrial fibrillation with rapid ventricular rate. The patient has a history of atrial fibrillation, chads score of 5, not currently on chronic anticoagulation, diastolic CHF, end-stage renal disease on dialysis, hypertension, COPD on home oxygen, and recurrent pleural effusions requiring thoracentesis. The patient reports waking up in the middle of the night around 10:30 last night with chest discomfort, severe nausea, and overall feeling unwell. He did have associated shortness of breath, without diaphoresis or radiation The patient called his daughter, who then called EMS.  The patient's heart rate was noted to be 140 bpm. The patient received 2 doses of IV Toprol, was started on Cardizem drip, and converted to sinus rhythm. Chest x-ray revealed increased right pleural effusion. The patient was admitted on 10/31/2016 for symptoms of diastolic CHF, and was noted to be in atrial fibrillation of unknown duration. 2-D echocardiogram on 08/21/2016 revealed normal left ventricular function, with LVEF 60-65%. The patient is status post cardiac catheterization on 08/21/16, which revealed diffuse disease RCA with collaterals from the LAD, and 75% stenosis proximal left circumflex; medical management was recommended. Currently, the patient denies chest pain, shortness of breath, palpitations, or nausea.  Review of systems complete and found to be negative unless listed above     Past Medical History:  Diagnosis Date  . Cancer [facility])    Prostate  . CHF (congestive heart failure) ([place])   . Chronic kidney disease   . Complication of anesthesia    hard to wake up  . COPD (chronic  obstructive pulmonary disease) ([place])   . Diabetes mellitus without complication ([place])   . Difficult intubation   . Dyspnea   . GERD (gastroesophageal reflux disease)   . Hyperlipidemia   . Hypertension   . Pleural effusion   . Renal insufficiency     Past Surgical History:  Procedure Laterality Date  . APPENDECTOMY    . AV FISTULA PLACEMENT Left 05/30/2016   Procedure: ARTERIOVENOUS (AV) FISTULA CREATION ( BRACHIOCEPHALIC );  Surgeon: [name], MD;  Location: [facility] ORS;  Service: Vascular;  Laterality: Left;  . CAPD INSERTION N/A 05/30/2016   Procedure: LAPAROSCOPIC INSERTION CONTINUOUS AMBULATORY PERITONEAL DIALYSIS  (CAPD) CATHETER;  Surgeon: [name], MD;  Location: [facility] ORS;  Service: Vascular;  Laterality: N/A;  . DIALYSIS/PERMA CATHETER INSERTION    . DIALYSIS/PERMA CATHETER REMOVAL N/A 08/21/2016   Procedure: Dialysis/Perma Catheter Removal;  Surgeon: [name], MD;  Location: [place] CV LAB;  Service: Cardiovascular;  Laterality: N/A;  . JOINT REPLACEMENT     right knee   . LEFT HEART CATH AND CORONARY ANGIOGRAPHY N/A 08/21/2016   Procedure: Left Heart Cath and Coronary Angiography possible PCI;  Surgeon: [name], MD;  Location: [place] CV LAB;  Service: Cardiovascular;  Laterality: N/A;  . postate removal     . PROSTATE SURGERY    . PSEUDOANERYSM COMPRESSION Right 08/27/2016   Procedure: Pseudoanerysm Compression;  Surgeon: Katha Cabal, MD;  Location: [place] CV LAB;  Service: Cardiovascular;  Laterality: Right;  . REMOVAL OF A DIALYSIS CATHETER N/A 08/08/2016   Procedure: REMOVAL OF A DIALYSIS CATHETER;  Surgeon: [name], MD;  Location: [facility] ORS;  Service: Vascular;  Laterality: N/A;    Prescriptions Prior  to Admission  Medication Sig Dispense Refill Last Dose  . acetaminophen (TYLENOL) 325 MG tablet Take 2 tablets (650 mg total) by mouth every 6 (six) hours as needed for mild pain (or Fever >/= 101).   prn at prn  . amLODipine  (NORVASC) 5 MG tablet Take 5 mg by mouth at bedtime.    11/24/2016 at Unknown time  . aspirin EC 81 MG tablet Take 81 mg by mouth daily.   11/24/2016 at Unknown time  . atorvastatin (LIPITOR) 20 MG tablet Take 20 mg by mouth at bedtime.    11/24/2016 at Unknown time  . calcium acetate (PHOSLO) 667 MG capsule Take 1,334 mg by mouth 3 (three) times daily with meals.    11/24/2016 at Unknown time  . carvedilol (COREG) 6.25 MG tablet Take 6.25 mg by mouth 2 (two) times daily. 8 am, 5 pm   11/24/2016 at Unknown time  . dextrose (GLUTOSE) 40 % GEL Take 1 Tube by mouth every 30 (thirty) minutes as needed for low blood sugar. Or until BS > 70 and pt is still responsive.   prn at prn  . doxazosin (CARDURA) 8 MG tablet Take 8 mg by mouth at bedtime.    11/24/2016 at Unknown time  . furosemide (LASIX) 40 MG tablet Take 40 mg by mouth 2 (two) times daily.    11/24/2016 at Unknown time  . glucagon (GLUCAGON EMERGENCY) 1 MG injection Inject 1 mg into the muscle once as needed. If not responding to oral glucose, snack. May repeat x 1 after 15 minutes. Use only if pt is or is becoming unresponsive as needed.   prn at prn  . insulin lispro (HUMALOG) 100 UNIT/ML injection Inject 10 Units into the skin 3 (three) times daily before meals.   11/24/2016 at Unknown time  . ipratropium-albuterol (DUONEB) 0.5-2.5 (3) MG/3ML SOLN Take 3 mLs by nebulization every 6 (six) hours as needed. (Patient taking differently: Take 3 mLs by nebulization every 6 (six) hours as needed (shortness of breath). ) 360 mL  prn at prn  . isosorbide mononitrate (IMDUR) 30 MG 24 hr tablet Take 1 tablet (30 mg total) by mouth daily. (Patient taking differently: Take 15 mg by mouth daily. ) 60 tablet 0 11/24/2016 at Unknown time  . Lidocaine-Prilocaine, Bulk, 2.5-2.5 % CREA Apply 1 application topically as needed. Apply to fistula site prior to dialysis treatments as needed   prn at prn  . lisinopril (PRINIVIL,ZESTRIL) 20 MG tablet Take 20 mg by mouth at  bedtime.    11/24/2016 at Unknown time  . omeprazole (PRILOSEC) 20 MG capsule Take 20 mg by mouth 2 (two) times daily before a meal.    11/24/2016 at Unknown time  . OXYGEN Inhale 2 L into the lungs continuous.   11/25/2016 at Unknown time  . spironolactone (ALDACTONE) 25 MG tablet Take 25 mg by mouth 2 (two) times daily.    11/24/2016 at Unknown time   Social History   Social History  . Marital status: Widowed    Spouse name: N/A  . Number of children: N/A  . Years of education: N/A   Occupational History  . retired    Social History Main Topics  . Smoking status: Former Smoker    Types: Cigarettes    Quit date: 05/21/1982  . Smokeless tobacco: Never Used  . Alcohol use 1.8 oz/week    3 Glasses of wine per week  . Drug use: No  . Sexual activity: No   Other  Topics Concern  . Not on file   Social History Narrative  . No narrative on file    Family History  Problem Relation Age of Onset  . Colon cancer Father   . Brain cancer Mother   . CAD Neg Hx   . Diabetes Neg Hx       Review of systems complete and found to be negative unless listed above      PHYSICAL EXAM  General: Well developed, well nourished, in no acute distress HEENT:  Normocephalic and atramatic Neck:  No JVD.  Lungs: Normal effort of breathing on supplemental O2. Right basilar crackles. No wheezing Heart: HRRR . Normal S1 and S2 without gallops or murmurs.  Abdomen: Bowel sounds are positive, abdomen soft and non-tender  Msk:  Back normal, able to roll in bed. Gait not assessed. Strength and tone appear appropriate for age Extremities: 1+ Pedal edema bilateral Neuro: Alert and oriented X 3. Psych:  Good affect, responds appropriately  Labs:   Lab Results  Component Value Date   WBC 10.7 (H) 11/25/2016   HGB 10.5 (L) 11/25/2016   HCT 30.2 (L) 11/25/2016   MCV 86.5 11/25/2016   PLT 154 11/25/2016    Recent Labs Lab 11/25/16 0316  NA 137  K 4.2  CL 98*  CO2 27  BUN 63*  CREATININE  5.93*  CALCIUM 9.1  GLUCOSE 326*   Lab Results  Component Value Date   TROPONINI 0.03 (HH) 11/25/2016    Lab Results  Component Value Date   CHOL 132 10/31/2016   Lab Results  Component Value Date   HDL 25 (L) 10/31/2016   Lab Results  Component Value Date   LDLCALC 73 10/31/2016   Lab Results  Component Value Date   TRIG 170 (H) 10/31/2016   Lab Results  Component Value Date   CHOLHDL 5.3 10/31/2016   No results found for: LDLDIRECT    Radiology: Dg Chest Port 1 View  Result Date: 11/25/2016 CLINICAL DATA:  Chest pain. EXAM: PORTABLE CHEST 1 VIEW COMPARISON:  PET-CT 10/22/2016.  Chest radiograph 10/20/2016 FINDINGS: Right pleural effusion has increased from prior exam, now moderate in size. Small left pleural effusion on prior CT is not well seen radiographically. The heart is enlarged. Mild interstitial edema. Atherosclerosis of the aortic arch. No pneumothorax. IMPRESSION: Increased right pleural effusion, now moderate in size. Cardiomegaly with mild interstitial edema. Electronically Signed   By: Jeb Levering M.D.   On: 11/25/2016 03:44    EKG: Sinus rhythm, rate 77 bpm  ASSESSMENT AND PLAN:  1. Atrial fibrillation with rapid ventricular response, chads vasc score 5, not currently on chronic anticoagulation due to expense, rate controlled and in normal sinus rhythm 2. Chest discomfort with nausea, now resolved without recurrence, initial troponin 0.03, with ECG revealing atrial fibrillation at a rate of 137 bpm with left posterior fascicular block with nonspecific ST-T wave abnormalities 3. ESRD on dialysis M,W,Fr 4. Hypertension, adequately controlled 5. Coronary artery disease, status post cardiac catheterization on 08/21/16, which revealed diffuse disease RCA with collaterals from the LAD, and 75% stenosis proximal left circumflex; medical management was recommended. Initial troponin 0.03. ECG with nonspecific ST and T-wave abnormalities. 6. Increased right  pleural effusion, moderate in size 7. COPD on oxygen supplement  Recommendations: 1. Continue current therapy 2. 2D echocardiogram 3. Recommend initiating warfarin as opposed to Eliquis for stroke prevention in the setting of atrial fibrillation with ESRD on hemodialysis; will need INR checked in 1 week. 4.  Continue to cycle cardiac enzymes 5. Continue current dose of carvedilol  Signed: Clabe Seal  11/25/2016, 2:41 PM

## 2016-11-25 NOTE — Progress Notes (Signed)
Post hemodialysis 

## 2016-11-25 NOTE — ED Triage Notes (Signed)
Ems pt to rm 19 with co chest pain tonight that kept him from getting to sleep. Pt reports pain is central chest with no radiation.

## 2016-11-25 NOTE — Progress Notes (Signed)
Pre-Hemodialysis 

## 2016-11-25 NOTE — Progress Notes (Signed)
[facility]  ROUNDING NOTE   Subjective:  Patient very well known to [place]. He comes in now with atrial fibrillation with rapid ventricular response. Heart rate is better this a.m. Patient due for hemodialysis later today as well.   Objective:  Vital signs in last 24 hours:  Temp:  [97.6 F (36.4 C)] 97.6 F (36.4 C) (08/27 0615) Pulse Rate:  [77-140] 77 (08/27 0655) Resp:  [14-20] 20 (08/27 0615) BP: (119-163)/(47-137) 141/48 (08/27 0655) SpO2:  [95 %-100 %] 96 % (08/27 0655) Weight:  [99 kg (218 lb 4.1 oz)] 99 kg (218 lb 4.1 oz) (08/27 0303)  Weight change:  Filed Weights   11/25/16 0303  Weight: 99 kg (218 lb 4.1 oz)    Intake/Output: No intake/output data recorded.   Intake/Output this shift:  Total I/O In: 120 [P.O.:120] Out: -   Physical Exam: General: No acute distress  Head: Normocephalic, atraumatic. Moist oral mucosal membranes  Eyes: Anicteric  Neck: Supple, trachea midline  Lungs:  Diminished at right base, normal effort   Heart: S1S2 irregular  Abdomen:  Soft, nontender, bowel sounds present  Extremities: Trace peripheral edema.  Neurologic: Awake, alert, following commands  Skin: No lesions  Access: LUE AVF    Basic Metabolic Panel:  Recent Labs Lab 11/25/16 0316  NA 137  K 4.2  CL 98*  CO2 27  GLUCOSE 326*  BUN 63*  CREATININE 5.93*  CALCIUM 9.1    Liver Function Tests: No results for input(s): AST, ALT, ALKPHOS, BILITOT, PROT, ALBUMIN in the last 168 hours. No results for input(s): LIPASE, AMYLASE in the last 168 hours. No results for input(s): AMMONIA in the last 168 hours.  CBC:  Recent Labs Lab 11/25/16 0316  WBC 10.7*  HGB 10.5*  HCT 30.2*  MCV 86.5  PLT 154    Cardiac Enzymes:  Recent Labs Lab 11/25/16 0316  TROPONINI 0.03*    BNP: Invalid input(s): POCBNP  CBG:  Recent Labs Lab 11/25/16 0548 11/25/16 0736  GLUCAP 342* 319*    Microbiology: Results for orders placed or performed  during the hospital encounter of 11/25/16  MRSA PCR Screening     Status: None   Collection Time: 11/25/16  7:00 AM  Result Value Ref Range Status   MRSA by PCR NEGATIVE NEGATIVE Final    Comment:        The GeneXpert MRSA Assay (FDA approved for NASAL specimens only), is one component of a comprehensive MRSA colonization surveillance program. It is not intended to diagnose MRSA infection nor to guide or monitor treatment for MRSA infections.     Coagulation Studies: No results for input(s): LABPROT, INR in the last 72 hours.  Urinalysis: No results for input(s): COLORURINE, LABSPEC, PHURINE, GLUCOSEU, HGBUR, BILIRUBINUR, KETONESUR, PROTEINUR, UROBILINOGEN, NITRITE, LEUKOCYTESUR in the last 72 hours.  Invalid input(s): APPERANCEUR    Imaging: Dg Chest Port 1 View  Result Date: 11/25/2016 CLINICAL DATA:  Chest pain. EXAM: PORTABLE CHEST 1 VIEW COMPARISON:  PET-CT 10/22/2016.  Chest radiograph 10/20/2016 FINDINGS: Right pleural effusion has increased from prior exam, now moderate in size. Small left pleural effusion on prior CT is not well seen radiographically. The heart is enlarged. Mild interstitial edema. Atherosclerosis of the aortic arch. No pneumothorax. IMPRESSION: Increased right pleural effusion, now moderate in size. Cardiomegaly with mild interstitial edema. Electronically Signed   By: Jeb Levering M.D.   On: 11/25/2016 03:44     Medications:    . amLODipine  5 mg Oral QHS  .  aspirin EC  81 mg Oral Daily  . atorvastatin  20 mg Oral QHS  . calcium acetate  1,334 mg Oral TID WC  . carvedilol  6.25 mg Oral BID  . doxazosin  8 mg Oral QHS  . furosemide  40 mg Oral BID  . heparin  5,000 Units Subcutaneous Q8H  . insulin aspart  0-5 Units Subcutaneous QHS  . insulin aspart  0-9 Units Subcutaneous TID WC  . isosorbide mononitrate  30 mg Oral Daily  . lisinopril  20 mg Oral QHS  . pantoprazole  40 mg Oral Daily  . spironolactone  25 mg Oral BID    acetaminophen **OR** acetaminophen, ipratropium-albuterol  Assessment/ Plan:  76 y.o. male with diabetes mellitus type II insulin dependent, hypertension, coronary artery disease, hyperlipidemia, prostate cancer status post prostatectomy, right total knee, appendectomy.   MWF [facility] [place] AVF  1. End Stage Renal Disease MWF: - Patient due for hemodialysis today. Orders have been prepared.  2. Anemia of chronic kidney disease:  Hemoglobin currently 10.5. Hold off on Epogen for now.  3. 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  - Management as per hospitalist.    4. Secondary Hyperparathyroidism:  - Check serum phosphorus with dialysis today.  Maintain the patient on calcium acetate 2 tablets by mouth 3 times a day with meals.  5. A Fib with RVR:  Patient currently on carvedilol. Otherwise care as per cardiology.   LOS: 0 Tahje Borawski 8/27/201811:49 AM

## 2016-11-25 NOTE — Progress Notes (Signed)
Post Hemodialysis: Patient signed off treatment 7minutes early,no reason given.1.7 net fluid removed.Hemastasis achieved,sites dressed with gauze/taped.

## 2016-11-25 NOTE — ED Provider Notes (Signed)
[facility] Emergency Department Provider Note   ____________________________________________   First MD Initiated Contact with Patient 11/25/16 0308     (approximate)  I have reviewed the triage vital signs and the nursing notes.   HISTORY  Chief Complaint Chest Pain    HPI Calvin Good is a 76 y.o. male brought to the ED from home via [place] a chief complaint of chest pain.patient has a history of ESRD on HD M/W/F, CHF, recent admission for new onset atrial fibrillation, history of pleural effusions requiring thoracentesis. Has not yet been started on anticoagulation but cardiologist is speaking with his PCP about starting anticoagulants. Patient has not been to bed yet tonight secondary to central chest pressure. Symptoms associated shortness of breath. Denies diaphoresis, nausea/vomiting, dizziness. Denies recent fever, chills, abdominal pain. Makes a little urine daily. Denies recent travel trauma. Nothing makes his symptoms better or worse.   Past Medical History:  Diagnosis Date  . Cancer [facility])    Prostate  . CHF (congestive heart failure) ([place])   . Chronic kidney disease   . Complication of anesthesia    hard to wake up  . COPD (chronic obstructive pulmonary disease) ([place])   . Diabetes mellitus without complication ([place])   . Difficult intubation   . Dyspnea   . GERD (gastroesophageal reflux disease)   . Hyperlipidemia   . Hypertension   . Pleural effusion   . Renal insufficiency     Patient Active Problem List   Diagnosis Date Noted  . A-fib ([place]) 10/30/2016  . Allergic rhinitis due to pollen 10/29/2016  . Chronic airway obstruction ([place]) 10/29/2016  . Chronic fatigue syndrome 10/29/2016  . Dependence on renal dialysis ([place]) 10/29/2016  . Diabetic neuropathy ([place]) 10/29/2016  . Diabetic renal disease ([place]) 10/29/2016  . Heart failure ([place]) 10/29/2016  . Hyperglycemia due to type 2 diabetes mellitus ([place]) 10/29/2016  .  Incontinence without sensory awareness 10/29/2016  . Long term current use of insulin ([place]) 10/29/2016  . Obstructive sleep apnea 10/29/2016  . Urinary incontinence 10/29/2016  . Acute on chronic respiratory failure ([place]) 10/16/2016  . Anxiety 10/10/2016  . Acute encephalopathy   . HCAP (healthcare-associated pneumonia) 09/14/2016  . Tobacco use 09/03/2016  . Aneurysm ([place]) 08/28/2016  . Pseudoaneurysm following procedure ([place]) 08/26/2016  . Chest tightness 08/20/2016  . GERD without esophagitis 08/20/2016  . NSTEMI (non-ST elevated myocardial infarction) ([place]) 08/20/2016  . Cancer of prostate ([place]) 05/16/2016  . Chronic diastolic CHF (congestive heart failure) ([place]) 05/16/2016  . Diabetic polyneuropathy associated with type 2 diabetes mellitus ([place]) 05/16/2016  . Onychomycosis of toenail 05/13/2016  . Essential hypertension, benign 04/23/2016  . Hyperlipidemia 04/23/2016  . Type 2 diabetes mellitus with chronic kidney disease on chronic dialysis, with long-term current use of insulin ([place]) 04/23/2016  . ESRD (end stage renal disease) ([place]) 04/23/2016    Past Surgical History:  Procedure Laterality Date  . APPENDECTOMY    . AV FISTULA PLACEMENT Left 05/30/2016   Procedure: ARTERIOVENOUS (AV) FISTULA CREATION ( BRACHIOCEPHALIC );  Surgeon: [name], MD;  Location: [facility] ORS;  Service: Vascular;  Laterality: Left;  . CAPD INSERTION N/A 05/30/2016   Procedure: LAPAROSCOPIC INSERTION CONTINUOUS AMBULATORY PERITONEAL DIALYSIS  (CAPD) CATHETER;  Surgeon: [name], MD;  Location: [facility] ORS;  Service: Vascular;  Laterality: N/A;  . DIALYSIS/PERMA CATHETER INSERTION    . DIALYSIS/PERMA CATHETER REMOVAL N/A 08/21/2016   Procedure: Dialysis/Perma Catheter Removal;  Surgeon: [name], MD;  Location: [facility]  INVASIVE CV LAB;  Service: Cardiovascular;  Laterality: N/A;  . JOINT REPLACEMENT     right knee   . LEFT HEART CATH AND CORONARY ANGIOGRAPHY N/A 08/21/2016   Procedure: Left Heart Cath  and Coronary Angiography possible PCI;  Surgeon: [name], MD;  Location: [place] CV LAB;  Service: Cardiovascular;  Laterality: N/A;  . postate removal     . PROSTATE SURGERY    . PSEUDOANERYSM COMPRESSION Right 08/27/2016   Procedure: Pseudoanerysm Compression;  Surgeon: Katha Cabal, MD;  Location: [place] CV LAB;  Service: Cardiovascular;  Laterality: Right;  . REMOVAL OF A DIALYSIS CATHETER N/A 08/08/2016   Procedure: REMOVAL OF A DIALYSIS CATHETER;  Surgeon: [name], MD;  Location: [facility] ORS;  Service: Vascular;  Laterality: N/A;    Prior to Admission medications   Medication Sig Start Date End Date Taking? Authorizing Provider  acetaminophen (TYLENOL) 325 MG tablet Take 2 tablets (650 mg total) by mouth every 6 (six) hours as needed for mild pain (or Fever >/= 101). 09/23/16  Yes Gouru, Illene Silver, MD  amLODipine (NORVASC) 5 MG tablet Take 5 mg by mouth at bedtime.    Yes [provider]  aspirin EC 81 MG tablet Take 81 mg by mouth daily.   Yes [provider]  atorvastatin (LIPITOR) 20 MG tablet Take 20 mg by mouth at bedtime.    Yes [provider]  calcium acetate (PHOSLO) 667 MG capsule Take 1,334 mg by mouth 3 (three) times daily with meals.    Yes [provider]  carvedilol (COREG) 6.25 MG tablet Take 6.25 mg by mouth 2 (two) times daily. 8 am, 5 pm 09/05/16  Yes [provider]  dextrose (GLUTOSE) 40 % GEL Take 1 Tube by mouth every 30 (thirty) minutes as needed for low blood sugar. Or until BS > 70 and pt is still responsive.   Yes [provider]  doxazosin (CARDURA) 8 MG tablet Take 8 mg by mouth at bedtime.    Yes [provider]  furosemide (LASIX) 40 MG tablet Take 40 mg by mouth 2 (two) times daily.    Yes [provider]  glucagon (GLUCAGON EMERGENCY) 1 MG injection Inject 1 mg into the muscle once as needed. If not responding to oral glucose, snack. May repeat x 1 after 15  minutes. Use only if pt is or is becoming unresponsive as needed.   Yes [provider]  insulin lispro (HUMALOG) 100 UNIT/ML injection Inject 10 Units into the skin 3 (three) times daily before meals.   Yes [provider]  ipratropium-albuterol (DUONEB) 0.5-2.5 (3) MG/3ML SOLN Take 3 mLs by nebulization every 6 (six) hours as needed. Patient taking differently: Take 3 mLs by nebulization every 6 (six) hours as needed (shortness of breath).  09/23/16  Yes Gouru, Illene Silver, MD  isosorbide mononitrate (IMDUR) 30 MG 24 hr tablet Take 1 tablet (30 mg total) by mouth daily. Patient taking differently: Take 15 mg by mouth daily.  08/22/16  Yes Dustin Flock, MD  Lidocaine-Prilocaine, Bulk, 2.5-2.5 % CREA Apply 1 application topically as needed. Apply to fistula site prior to dialysis treatments as needed   Yes [provider]  lisinopril (PRINIVIL,ZESTRIL) 20 MG tablet Take 20 mg by mouth at bedtime.    Yes [provider]  omeprazole (PRILOSEC) 20 MG capsule Take 20 mg by mouth 2 (two) times daily before a meal.    Yes [provider]  OXYGEN Inhale 2 L  into the lungs continuous.   Yes [provider]  spironolactone (ALDACTONE) 25 MG tablet Take 25 mg by mouth 2 (two) times daily.    Yes [provider]    Allergies Tetracyclines & related and Morphine  Family History  Problem Relation Age of Onset  . Colon cancer Father   . Brain cancer Mother   . CAD Neg Hx   . Diabetes Neg Hx     Social History Social History  Substance Use Topics  . Smoking status: Former Smoker    Quit date: 05/21/1982  . Smokeless tobacco: Never Used  . Alcohol use 1.8 oz/week    3 Glasses of wine per week    Review of Systems  Constitutional: No fever/chills. Eyes: No visual changes. ENT: No sore throat. Cardiovascular: Positive for chest pain. Respiratory: positive for shortness of breath. Gastrointestinal: No abdominal pain.  Positive for nausea,  no vomiting.  No diarrhea.  No constipation. Genitourinary: Negative for dysuria. Musculoskeletal: Negative for back pain. Skin: Negative for rash. Neurological: Negative for headaches, focal weakness or numbness.   ____________________________________________   PHYSICAL EXAM:  VITAL SIGNS: ED Triage Vitals  Enc Vitals Group     BP --      Pulse Rate 11/25/16 0300 (!) 140     Resp 11/25/16 0300 15     Temp --      Temp Source 11/25/16 0300 Oral     SpO2 11/25/16 0300 98 %     Weight 11/25/16 0303 218 lb 4.1 oz (99 kg)     Height 11/25/16 0303 5\' 9"  (1.753 m)     Head Circumference --      Peak Flow --      Pain Score 11/25/16 0300 3     Pain Loc --      Pain Edu? --      Excl. in [place]? --     Constitutional: Alert and oriented. Chronically ill appearing and in mild acute distress. Eyes: Conjunctivae are normal. PERRL. EOMI. Head: Atraumatic. Nose: No congestion/rhinnorhea. Mouth/Throat: Mucous membranes are moist.  Oropharynx non-erythematous. Neck: No stridor.   Cardiovascular: tachycardic rate, irregular rhythm. Grossly normal heart sounds.  Good peripheral circulation. Respiratory: Normal respiratory effort.  No retractions. Lungs with bibasilar rales. Gastrointestinal: Soft and nontender. No distention. No abdominal bruits. No CVA tenderness. Musculoskeletal: No lower extremity tenderness. 2+ nonpitting edema.  No joint effusions. Neurologic:  Normal speech and language. No gross focal neurologic deficits are appreciated.  Skin:  Skin is warm, dry and intact. No rash noted. Psychiatric: Mood and affect are normal. Speech and behavior are normal.  ____________________________________________   LABS (all labs ordered are listed, but only abnormal results are displayed)  Labs Reviewed  BASIC METABOLIC PANEL - Abnormal; Notable for the following:       Result Value   Chloride 98 (*)    Glucose, Bld 326 (*)    BUN 63 (*)    Creatinine, Ser 5.93 (*)    GFR calc  non Af Amer 8 (*)    GFR calc Af Amer 9 (*)    All other components within normal limits  CBC - Abnormal; Notable for the following:    WBC 10.7 (*)    RBC 3.50 (*)    Hemoglobin 10.5 (*)    HCT 30.2 (*)    RDW 15.4 (*)    All other components within normal limits  TROPONIN I - Abnormal; Notable for the following:    Troponin  I 0.03 (*)    All other components within normal limits   ____________________________________________  EKG  ED ECG REPORT I, Morenike Cuff J, the attending physician, personally viewed and interpreted this ECG.   Date: 11/25/2016  EKG Time: 0301  Rate: 137  Rhythm: atrial fibrillation, rate 137  Axis: LAD  Intervals:left posterior fascicular block  ST&T Change: Nonspecific  ____________________________________________  RADIOLOGY  Dg Chest Port 1 View  Result Date: 11/25/2016 CLINICAL DATA:  Chest pain. EXAM: PORTABLE CHEST 1 VIEW COMPARISON:  PET-CT 10/22/2016.  Chest radiograph 10/20/2016 FINDINGS: Right pleural effusion has increased from prior exam, now moderate in size. Small left pleural effusion on prior CT is not well seen radiographically. The heart is enlarged. Mild interstitial edema. Atherosclerosis of the aortic arch. No pneumothorax. IMPRESSION: Increased right pleural effusion, now moderate in size. Cardiomegaly with mild interstitial edema. Electronically Signed   By: Jeb Levering M.D.   On: 11/25/2016 03:44    ____________________________________________   PROCEDURES  Procedure(s) performed: None  Procedures  Critical Care performed: Yes, see critical care note(s)   CRITICAL CARE Performed by: Paulette Blanch   Total critical care time: 45 minutes  Critical care time was exclusive of separately billable procedures and treating other patients.  Critical care was necessary to treat or prevent imminent or life-threatening deterioration.  Critical care was time spent personally by me on the following activities: development of  treatment plan with patient and/or surrogate as well as nursing, discussions with consultants, evaluation of patient's response to treatment, examination of patient, obtaining history from patient or surrogate, ordering and performing treatments and interventions, ordering and review of laboratory studies, ordering and review of radiographic studies, pulse oximetry and re-evaluation of patient's condition.  ____________________________________________   INITIAL IMPRESSION / ASSESSMENT AND PLAN / ED COURSE  Pertinent labs & imaging results that were available during my care of the patient were reviewed by me and considered in my medical decision making (see chart for details).  76 year old male with ESRD, CHF, new onset A. Fib who presents with chest pain. Currently in atrial fibrillation with RVR. Chart review demonstrates patient had good success and conversion with IV beta blocker on his most recent visit. Will dose 5 mg IV Lopressor to start.  Clinical Course as of Nov 26 411  Mon Nov 25, 2016  0411 Heart rate 128. Will give additional dose of Lopressor. Discussed with hospitalist who will evaluate patient in the emergency department for admission.  [JS]    Clinical Course User Index [JS] Paulette Blanch, MD     ____________________________________________   FINAL CLINICAL IMPRESSION(S) / ED DIAGNOSES  Final diagnoses:  Chest pain, unspecified type  Atrial fibrillation with RVR (HCC)  ESRD (end stage renal disease) on dialysis ([place])  Pleural effusion      NEW MEDICATIONS STARTED DURING THIS VISIT:  New Prescriptions   No medications on file     Note:  This document was prepared using Dragon voice recognition software and may include unintentional dictation errors.    Paulette Blanch, MD 11/25/16 [phone]

## 2016-11-25 NOTE — Progress Notes (Signed)
Hemodialysis started

## 2016-11-25 NOTE — Progress Notes (Signed)
Inpatient Diabetes Program Recommendations  AACE/ADA: New Consensus Statement on Inpatient Glycemic Control (2015)  Target Ranges:  Prepandial:   less than 140 mg/dL      Peak postprandial:   less than 180 mg/dL (1-2 hours)      Critically ill patients:  140 - 180 mg/dL   Results for Calvin Good, Calvin Good (MRN [id number]) as of 11/25/2016 08:52  Ref. Range 11/25/2016 07:36  Glucose-Capillary Latest Ref Range: 65 - 99 mg/dL 319 (H)    Admit with: CP  History: DM, CHF, COPD, ESRD  Home DM Meds: NPH Insulin 65 units BID       Humalog 10 units TID  Current Insulin Orders: Novolog Sensitive Correction Scale/ SSI (0-9 units) TID AC + HS       MD- Per review of Care Everywhere, patient saw his PCP (Dr. Frazier Richards with [facility]) on 10/15/16.  At that visit, patient's DM Medications were as follows: NPH Insulin 65 units BID Humalog 10 units TID   Not sure why NPH Insulin did not show up on Home Med Rec list?  Please consider starting basal insulin for this patient in hospital.  Will have to substitute Levemir for NPH as NPH not on formulary.    Recommend starting with approximately 1/3 total home dose and titrate upward as needed:  Levemir 20 units BID      --Will follow patient during hospitalization--  Wyn Quaker RN, MSN, CDE Diabetes Coordinator Inpatient Glycemic Control Team Team Pager: [phone] (8a-5p)

## 2016-11-25 NOTE — Progress Notes (Signed)
[facility] [facility] [place] NAME: Calvin Good    MR#:  [id number]  DATE OF BIRTH:  [birth year]  SUBJECTIVE:  CHIEF COMPLAINT:   Chief Complaint  Patient presents with  . Chest Pain   - feels much better. Denies any chest pain or palpitations. -Scheduled for dialysis this afternoon  REVIEW OF SYSTEMS:  Review of Systems  Constitutional: Positive for malaise/fatigue. Negative for chills and fever.  HENT: Negative for congestion, ear discharge, hearing loss and nosebleeds.   Eyes: Negative for blurred vision and double vision.  Respiratory: Negative for cough, shortness of breath and wheezing.   Cardiovascular: Positive for palpitations. Negative for chest pain and leg swelling.  Gastrointestinal: Negative for abdominal pain, constipation, diarrhea, nausea and vomiting.  Genitourinary: Negative for dysuria.  Musculoskeletal: Negative for myalgias.  Neurological: Negative for dizziness, speech change, focal weakness, seizures and headaches.  Psychiatric/Behavioral: Negative for depression.    DRUG ALLERGIES:   Allergies  Allergen Reactions  . Tetracyclines & Related Other (See Comments)    Reaction: unknown happened a long time ago and pt doesn't remember reaction   . Morphine Other (See Comments)    Other reaction(s): disoriented Reaction: confusion    VITALS:  Blood pressure (!) 143/48, pulse 77, temperature 97.8 F (36.6 C), temperature source Oral, resp. rate 18, height 5\' 9"  (1.753 m), weight 99 kg (218 lb 4.1 oz), SpO2 97 %.  PHYSICAL EXAMINATION:  Physical Exam  GENERAL:  76 y.o.-year-old patient lying in the bed with no acute distress.  EYES: Pupils equal, round, reactive to light and accommodation. No scleral icterus. Extraocular muscles intact.  HEENT: Head atraumatic, normocephalic. Oropharynx and nasopharynx clear.  NECK:  Supple, no jugular venous distention. No thyroid enlargement, no tenderness.  LUNGS: Normal  breath sounds bilaterally, no wheezing, rales,rhonchi or crepitation. No use of accessory muscles of respiration. Decreased bibasilar breath sounds CARDIOVASCULAR: S1, S2 normal. No  rubs, or gallops. 2/6 systolic murmur ABDOMEN: Soft, nontender, nondistended. Bowel sounds present. No organomegaly or mass.  EXTREMITIES: No pedal edema, cyanosis, or clubbing.  NEUROLOGIC: Cranial nerves II through XII are intact. Muscle strength 5/5 in all extremities. Sensation intact. Gait not checked.  PSYCHIATRIC: The patient is alert and oriented x 3.  SKIN: No obvious rash, lesion, or ulcer.    LABORATORY PANEL:   CBC  Recent Labs Lab 11/25/16 0316  WBC 10.7*  HGB 10.5*  HCT 30.2*  PLT 154   ------------------------------------------------------------------------------------------------------------------  Chemistries   Recent Labs Lab 11/25/16 0316  NA 137  K 4.2  CL 98*  CO2 27  GLUCOSE 326*  BUN 63*  CREATININE 5.93*  CALCIUM 9.1   ------------------------------------------------------------------------------------------------------------------  Cardiac Enzymes  Recent Labs Lab 11/25/16 0316  TROPONINI 0.03*   ------------------------------------------------------------------------------------------------------------------  RADIOLOGY:  Dg Chest Port 1 View  Result Date: 11/25/2016 CLINICAL DATA:  Chest pain. EXAM: PORTABLE CHEST 1 VIEW COMPARISON:  PET-CT 10/22/2016.  Chest radiograph 10/20/2016 FINDINGS: Right pleural effusion has increased from prior exam, now moderate in size. Small left pleural effusion on prior CT is not well seen radiographically. The heart is enlarged. Mild interstitial edema. Atherosclerosis of the aortic arch. No pneumothorax. IMPRESSION: Increased right pleural effusion, now moderate in size. Cardiomegaly with mild interstitial edema. Electronically Signed   By: Jeb Levering M.D.   On: 11/25/2016 03:44    EKG:   Orders placed or performed  during the hospital encounter of 11/25/16  . EKG 12-Lead  . EKG 12-Lead  .  ED EKG within 10 minutes  . ED EKG within 10 minutes  . EKG 12-Lead  . EKG 12-Lead  . EKG 12-Lead  . EKG 12-Lead    ASSESSMENT AND PLAN:   76 year old male with past medical history significant for end-stage renal disease on hemodialysis, congestive heart failure, COPD  on 2L home oxygen, diabetes and hypertension presents to hospital secondary to chest pain and palpitations  #1 paroxysmal atrial fibrillation with rapid ventricular response-responded to medications in the emergency room. -Patient's rate is well controlled and he is in normal sinus rhythm at this time. -continue eliquis for anticoagulation based on his outpatient cardiology notes. -On Coreg twice a day for rate control.  #2 diabetes mellitus-appreciate diabetes coordinator's input. Started on Levemir twice a day at a reduced dose in the hospital. We'll titrate to cover sugars appropriately -Also sliding scale insulin  #[id number] systolic CHF with EF of 01%-UXNATFTD medical therapy with Coreg, lisinopril, Aldactone and Lasix. Well compensated at this time  #4 end-stage renal disease on hemodialysis-appreciate nephrology input. Patient is due for dialysis today per his routine schedule  #5 COPD-stable at this time. On 2 L home oxygen -Continue inhalers. Continue outpatient pulmonary follow-up -History of pleural effusion recently status post thoracentesis  #6 DVT prophylaxis-started on eliquis today  Encourage ambulation today. Anticipate discharge tomorrow     All the records are reviewed and case discussed with Care Management/Social Workerr. Management plans discussed with the patient, family and they are in agreement.  CODE STATUS: Full Code  TOTAL TIME TAKING CARE OF THIS PATIENT: 38 minutes.   POSSIBLE D/C TOMORROW, DEPENDING ON CLINICAL CONDITION.   Gladstone Lighter M.D on 11/25/2016 at 2:03 PM  Between 7am to 6pm - Pager -  [phone]  After 6pm go to [email or web address] - password [email or web address] [facility]  Office  [phone]  CC: Primary care physician; [name], MD

## 2016-11-26 ENCOUNTER — Encounter: Payer: Self-pay | Admitting: Radiology

## 2016-11-26 ENCOUNTER — Inpatient Hospital Stay
Admission: EM | Admit: 2016-11-26 | Discharge: 2016-11-26 | Disposition: A | Discharge status: Home / Self Care | Payer: Medicare Other | Source: Home / Self Care | Attending: Physician Assistant | Admitting: Physician Assistant

## 2016-11-26 ENCOUNTER — Inpatient Hospital Stay: Admission: EM | Admit: 2016-11-26 | Payer: Medicare Other | Source: Home / Self Care | Admitting: Internal Medicine

## 2016-11-26 ENCOUNTER — Inpatient Hospital Stay: Payer: Medicare Other

## 2016-11-26 DIAGNOSIS — K625 Hemorrhage of anus and rectum: Secondary | ICD-10-CM

## 2016-11-26 LAB — GLUCOSE, CAPILLARY
Glucose-Capillary: 198 mg/dL — ABNORMAL HIGH (ref 65–99)
Glucose-Capillary: 280 mg/dL — ABNORMAL HIGH (ref 65–99)
Glucose-Capillary: 214 mg/dL — ABNORMAL HIGH (ref 65–99)
Glucose-Capillary: 223 mg/dL — ABNORMAL HIGH (ref 65–99)
Glucose-Capillary: 281 mg/dL — ABNORMAL HIGH (ref 65–99)

## 2016-11-26 LAB — CBC
HEMATOCRIT: 27.9 % — AB (ref 40.0–52.0)
HEMOGLOBIN: 9.5 g/dL — AB (ref 13.0–18.0)
MCH: 29.1 pg (ref 26.0–34.0)
MCHC: 34.1 g/dL (ref 32.0–36.0)
MCV: 85.3 fL (ref 80.0–100.0)
Platelets: 153 10*3/uL (ref 150–440)
RBC: 3.28 MIL/uL — ABNORMAL LOW (ref 4.40–5.90)
RDW: 15.3 % — ABNORMAL HIGH (ref 11.5–14.5)
WBC: 9.4 10*3/uL (ref 3.8–10.6)

## 2016-11-26 LAB — HEMOGLOBIN: HEMOGLOBIN: 10.3 g/dL — AB (ref 13.0–18.0)

## 2016-11-26 LAB — APTT: aPTT: 83 seconds — ABNORMAL HIGH (ref 24–36)

## 2016-11-26 LAB — PROTIME-INR
INR: 1.16
INR: 1.08
PROTHROMBIN TIME: 14 s (ref 11.4–15.2)
Prothrombin Time: 14.9 seconds (ref 11.4–15.2)

## 2016-11-26 LAB — TROPONIN I: Troponin I: 0.48 ng/mL (ref <0.03)

## 2016-11-26 MED ORDER — INSULIN DETEMIR 100 UNIT/ML ~~LOC~~ SOLN
27.0000 [IU] | Freq: Two times a day (BID) | SUBCUTANEOUS | Status: DC
  Administered 2016-11-26: 27 [IU] via SUBCUTANEOUS
  Filled 2016-11-26 (×4): qty 0.27

## 2016-11-26 MED ORDER — POLYETHYLENE GLYCOL 3350 17 G PO PACK
17.0000 g | PACK | Freq: Every day | ORAL | Status: DC
  Administered 2016-11-26: 17 g via ORAL
  Filled 2016-11-26: qty 1

## 2016-11-26 MED ORDER — TECHNETIUM TC 99M-LABELED RED BLOOD CELLS IV KIT
20.0000 | PACK | Freq: Once | INTRAVENOUS | Status: AC | PRN
  Administered 2016-11-26: 22.892 via INTRAVENOUS

## 2016-11-26 MED ORDER — TRAMADOL HCL 50 MG PO TABS
50.0000 mg | ORAL_TABLET | Freq: Four times a day (QID) | ORAL | Status: DC | PRN
  Administered 2016-11-26: 50 mg via ORAL
  Filled 2016-11-26: qty 1

## 2016-11-26 NOTE — Progress Notes (Addendum)
[facility] [place] NAME: Calvin Good    MR#:  [id number]  DATE OF BIRTH:  1940-08-04  SUBJECTIVE:  CHIEF COMPLAINT:   Chief Complaint  Patient presents with  . Chest Pain   -continues to feel dyspneic. His incontinent pad was loaded with dark blood this morning - remains on 4L o2  REVIEW OF SYSTEMS:  Review of Systems  Constitutional: Positive for malaise/fatigue. Negative for chills and fever.  HENT: Negative for congestion, ear discharge, hearing loss and nosebleeds.   Eyes: Negative for blurred vision and double vision.  Respiratory: Positive for shortness of breath. Negative for cough and wheezing.   Cardiovascular: Negative for chest pain, palpitations and leg swelling.  Gastrointestinal: Positive for blood in stool. Negative for abdominal pain, constipation, diarrhea, nausea and vomiting.  Genitourinary: Negative for dysuria.  Musculoskeletal: Negative for myalgias.  Neurological: Negative for dizziness, speech change, focal weakness, seizures and headaches.  Psychiatric/Behavioral: Negative for depression.    DRUG ALLERGIES:   Allergies  Allergen Reactions  . Tetracyclines & Related Other (See Comments)    Reaction: unknown happened a long time ago and pt doesn't remember reaction   . Morphine Other (See Comments)    Other reaction(s): disoriented Reaction: confusion    VITALS:  Blood pressure (!) 140/40, pulse 85, temperature 97.8 F (36.6 C), temperature source Oral, resp. rate 18, height 5\' 9"  (1.753 m), weight 98.4 kg (217 lb), SpO2 95 %.  PHYSICAL EXAMINATION:  Physical Exam  GENERAL:  76 y.o.-year-old patient lying in the bed with no acute distress.  EYES: Pupils equal, round, reactive to light and accommodation. No scleral icterus. Extraocular muscles intact.  HEENT: Head atraumatic, normocephalic. Oropharynx and nasopharynx clear.  NECK:  Supple, no jugular venous distention. No thyroid enlargement, no  tenderness.  LUNGS: Normal breath sounds bilaterally, no wheezing, rales,rhonchi or crepitation. No use of accessory muscles of respiration. Decreased bibasilar breath sounds CARDIOVASCULAR: S1, S2 normal. No  rubs, or gallops. 2/6 systolic murmur ABDOMEN: Soft, nontender, nondistended. Bowel sounds present. No organomegaly or mass.  EXTREMITIES: No pedal edema, cyanosis, or clubbing.  NEUROLOGIC: Cranial nerves II through XII are intact. Muscle strength 5/5 in all extremities. Sensation intact. Gait not checked.  PSYCHIATRIC: The patient is alert and oriented x 3.  SKIN: No obvious rash, lesion, or ulcer.    LABORATORY PANEL:   CBC  Recent Labs Lab 11/26/16 0142  WBC 9.4  HGB 9.5*  HCT 27.9*  PLT 153   ------------------------------------------------------------------------------------------------------------------  Chemistries   Recent Labs Lab 11/25/16 0316  NA 137  K 4.2  CL 98*  CO2 27  GLUCOSE 326*  BUN 63*  CREATININE 5.93*  CALCIUM 9.1   ------------------------------------------------------------------------------------------------------------------  Cardiac Enzymes  Recent Labs Lab 11/26/16 0142  TROPONINI 0.48*   ------------------------------------------------------------------------------------------------------------------  RADIOLOGY:  Dg Chest Port 1 View  Result Date: 11/25/2016 CLINICAL DATA:  Chest pain. EXAM: PORTABLE CHEST 1 VIEW COMPARISON:  PET-CT 10/22/2016.  Chest radiograph 10/20/2016 FINDINGS: Right pleural effusion has increased from prior exam, now moderate in size. Small left pleural effusion on prior CT is not well seen radiographically. The heart is enlarged. Mild interstitial edema. Atherosclerosis of the aortic arch. No pneumothorax. IMPRESSION: Increased right pleural effusion, now moderate in size. Cardiomegaly with mild interstitial edema. Electronically Signed   By: Jeb Levering M.D.   On: 11/25/2016 03:44    EKG:    Orders placed or performed during the hospital encounter of 11/25/16  .  EKG 12-Lead  . EKG 12-Lead  . ED EKG within 10 minutes  . ED EKG within 10 minutes  . EKG 12-Lead  . EKG 12-Lead  . EKG 12-Lead  . EKG 12-Lead    ASSESSMENT AND PLAN:   76 year old male with past medical history significant for end-stage renal disease on hemodialysis, congestive heart failure, COPD  on 2L home oxygen, diabetes and hypertension presents to hospital secondary to chest pain and palpitations  #1 paroxysmal atrial fibrillation with rapid ventricular response- -Patient's rate is well controlled and he is in normal sinus rhythm at this time. -On Coreg twice a day for rate control. -unable to tolerate anticoagulation has had GI bleed this morning. Discontinue heparin  #2 GI bleed-likely lower GI bleed. Secondary to IV heparin and received a dose of eliquis yesterday. -Last colonoscopy was a long time ago. Perineum examined with no open wounds. Urine was normal colored. -Discussed with GI, recommended outpatient follow-up and colonoscopy. If further bleeding noted, we'll order a GI bleeding scan. -hemoglobin drop noted. Continue to monitor closely. Transfuse if hemoglobin is rapidly dropping with hemodynamic changes or less than 7  #3 diabetes mellitus-appreciate diabetes coordinator's input. Started on Levemir twice a day at a reduced dose in the hospital. We will titrate to cover sugars appropriately -Also sliding scale insulin  #3 chronic systolic CHF with EF of 27%-POEUMPNT medical therapy with Coreg, lisinopril, Aldactone and Lasix. Well compensated at this time  #4 end-stage renal disease on hemodialysis-appreciate nephrology input.  On Monday Wednesday Friday dialysis. Dialysis tomorrow per schedule  #5 COPD-stable at this time. On 4 L home oxygen -Continue inhalers. Continue outpatient pulmonary follow-up -History of pleural effusion recently status post thoracentesis Chest x-ray on  admission showing again moderate right pleural effusion  #6 DVT prophylaxis-hold heparin products due to bleeding  Encourage ambulation today. physical therapy consulted. Anticipate discharge tomorrow  Updated daughter over the phone today   All the records are reviewed and case discussed with Care Management/Social Workerr. Management plans discussed with the patient, family and they are in agreement.  CODE STATUS: Full Code  TOTAL TIME TAKING CARE OF THIS PATIENT: 37 minutes.   POSSIBLE D/C TOMORROW, DEPENDING ON CLINICAL CONDITION.   Ishi Danser M.D on 11/26/2016 at 12:06 PM  Between 7am to 6pm - Pager - [phone]  After 6pm go to [email or web address] - password [email or web address] [facility]  Office  [phone]  CC: Primary care physician; [name], MD

## 2016-11-26 NOTE — Progress Notes (Signed)
PT Cancellation Note  Patient Details Name: Calvin Good MRN: [id number] DOB: June 19, 1940   Cancelled Treatment:    Reason Eval/Treat Not Completed: Patient at procedure or test/unavailable (Consult received and chart reviewed. Patient currently off unit for diagnostic testing.  Will re-attempt at later time/date as medically appropriate.)   Gerritt Galentine H. Owens Shark, PT, DPT, NCS 11/26/16, 1:41 PM [phone]

## 2016-11-26 NOTE — Consult Note (Signed)
Jonathon Bellows MD, MRCP(U.K) [address]  [place]  [place], [place] [address]  Main: [phone]  Fax: [phone]  Consultation  Referring Provider: Dr Tressia Miners Primary Care Physician:  [name], MD Primary Gastroenterologist: None          Reason for Consultation:     Rectal bleeding   Date of Admission:  11/25/2016 Date of Consultation:  11/26/2016         HPI:   Calvin Good is a 76 y.o. male with ESRD on dialysis , CHF,COPD, home oxygen admitted wit chest pain and palpitations, being treated for atrial fibrillation with RVR , was commenced on heparin  and Eloquis yesterday and then developed rectal bleeding . Hb at base line is 10.3 grams , today has a Hb of 9.5 grams.   He says all of a sudden today he had an episode of painless bleeding per rectum when he noted blood soaked pad. Didn't recur. Presently no blood on the pad. Recalls last colonoscopy was many years back , father had colon cancer. Presently on oxygen and feels short of breath on minimal exertion   Past Medical History:  Diagnosis Date  . Cancer [facility])    Prostate  . CHF (congestive heart failure) ([place])   . Chronic kidney disease   . Complication of anesthesia    hard to wake up  . COPD (chronic obstructive pulmonary disease) ([place])   . Diabetes mellitus without complication ([place])   . Difficult intubation   . Dyspnea   . GERD (gastroesophageal reflux disease)   . Hyperlipidemia   . Hypertension   . Pleural effusion   . Renal insufficiency     Past Surgical History:  Procedure Laterality Date  . APPENDECTOMY    . AV FISTULA PLACEMENT Left 05/30/2016   Procedure: ARTERIOVENOUS (AV) FISTULA CREATION ( BRACHIOCEPHALIC );  Surgeon: [name], MD;  Location: [facility] ORS;  Service: Vascular;  Laterality: Left;  . CAPD INSERTION N/A 05/30/2016   Procedure: LAPAROSCOPIC INSERTION CONTINUOUS AMBULATORY PERITONEAL DIALYSIS  (CAPD) CATHETER;  Surgeon: [name], MD;  Location: [facility] ORS;  Service:  Vascular;  Laterality: N/A;  . DIALYSIS/PERMA CATHETER INSERTION    . DIALYSIS/PERMA CATHETER REMOVAL N/A 08/21/2016   Procedure: Dialysis/Perma Catheter Removal;  Surgeon: [name], MD;  Location: [facility] CV LAB;  Service: Cardiovascular;  Laterality: N/A;  . JOINT REPLACEMENT     right knee   . LEFT HEART CATH AND CORONARY ANGIOGRAPHY N/A 08/21/2016   Procedure: Left Heart Cath and Coronary Angiography possible PCI;  Surgeon: [name], MD;  Location: [facility] CV LAB;  Service: Cardiovascular;  Laterality: N/A;  . postate removal     . PROSTATE SURGERY    . PSEUDOANERYSM COMPRESSION Right 08/27/2016   Procedure: Pseudoanerysm Compression;  Surgeon: Katha Cabal, MD;  Location: [facility] CV LAB;  Service: Cardiovascular;  Laterality: Right;  . REMOVAL OF A DIALYSIS CATHETER N/A 08/08/2016   Procedure: REMOVAL OF A DIALYSIS CATHETER;  Surgeon: [name], MD;  Location: [facility] ORS;  Service: Vascular;  Laterality: N/A;    Prior to Admission medications   Medication Sig Start Date End Date Taking? Authorizing Provider  acetaminophen (TYLENOL) 325 MG tablet Take 2 tablets (650 mg total) by mouth every 6 (six) hours as needed for mild pain (or Fever >/= 101). 09/23/16  Yes Gouru, Illene Silver, MD  amLODipine (NORVASC) 5 MG tablet Take 5 mg by mouth at bedtime.  Yes [provider]  aspirin EC 81 MG tablet Take 81 mg by mouth daily.   Yes [provider]  atorvastatin (LIPITOR) 20 MG tablet Take 20 mg by mouth at bedtime.    Yes [provider]  calcium acetate (PHOSLO) 667 MG capsule Take 1,334 mg by mouth 3 (three) times daily with meals.    Yes [provider]  carvedilol (COREG) 6.25 MG tablet Take 6.25 mg by mouth 2 (two) times daily. 8 am, 5 pm 09/05/16  Yes [provider]  dextrose (GLUTOSE) 40 % GEL Take 1 Tube by mouth every 30 (thirty) minutes as needed for low blood sugar. Or until BS > 70 and pt is still responsive.    Yes [provider]  doxazosin (CARDURA) 8 MG tablet Take 8 mg by mouth at bedtime.    Yes [provider]  furosemide (LASIX) 40 MG tablet Take 40 mg by mouth 2 (two) times daily.    Yes [provider]  glucagon (GLUCAGON EMERGENCY) 1 MG injection Inject 1 mg into the muscle once as needed. If not responding to oral glucose, snack. May repeat x 1 after 15 minutes. Use only if pt is or is becoming unresponsive as needed.   Yes [provider]  insulin lispro (HUMALOG) 100 UNIT/ML injection Inject 10 Units into the skin 3 (three) times daily before meals.   Yes [provider]  ipratropium-albuterol (DUONEB) 0.5-2.5 (3) MG/3ML SOLN Take 3 mLs by nebulization every 6 (six) hours as needed. Patient taking differently: Take 3 mLs by nebulization every 6 (six) hours as needed (shortness of breath).  09/23/16  Yes Gouru, Illene Silver, MD  isosorbide mononitrate (IMDUR) 30 MG 24 hr tablet Take 1 tablet (30 mg total) by mouth daily. Patient taking differently: Take 15 mg by mouth daily.  08/22/16  Yes Dustin Flock, MD  Lidocaine-Prilocaine, Bulk, 2.5-2.5 % CREA Apply 1 application topically as needed. Apply to fistula site prior to dialysis treatments as needed   Yes [provider]  lisinopril (PRINIVIL,ZESTRIL) 20 MG tablet Take 20 mg by mouth at bedtime.    Yes [provider]  omeprazole (PRILOSEC) 20 MG capsule Take 20 mg by mouth 2 (two) times daily before a meal.    Yes [provider]  OXYGEN Inhale 2 L into the lungs continuous.   Yes [provider]  spironolactone (ALDACTONE) 25 MG tablet Take 25 mg by mouth 2 (two) times daily.    Yes [provider]    Family History  Problem Relation Age of Onset  . Colon cancer Father   . Brain cancer Mother   . CAD Neg Hx   . Diabetes Neg Hx      Social History  Substance Use Topics  . Smoking status: Former Smoker    Types: Cigarettes    Quit date: 05/21/1982  .  Smokeless tobacco: Never Used  . Alcohol use 1.8 oz/week    3 Glasses of wine per week    Allergies as of 11/25/2016 - Review Complete 11/25/2016  Allergen Reaction Noted  . Tetracyclines & related Other (See Comments) 04/23/2016  . Morphine Other (See Comments) 02/14/2014    Review of Systems:    All systems reviewed and negative except where noted in HPI.   Physical Exam:  Vital signs in last 24 hours: Temp:  [97.8 F (36.6 C)-98 F (36.7 C)] 97.8 F (36.6 C) (08/28 0356) Pulse Rate:  [78-95] 85 (08/28 0822) Resp:  [13-22]  18 (08/28 0356) BP: (140-171)/(40-64) 140/40 (08/28 0822) SpO2:  [94 %-98 %] 95 % (08/28 0822) Weight:  [213 lb 4.8 oz (96.8 kg)-218 lb 4.1 oz (99 kg)] 217 lb (98.4 kg) (08/28 0356)   General:   Pleasant, cooperative in NAD, on nasal canulla oxygen  Head:  Normocephalic and atraumatic. Eyes:   No icterus.   Conjunctiva pink. PERRLA. Ears:  Normal auditory acuity. Neck:  Supple; no masses or thyroidomegaly Lungs: decreased air entry b/l    No wheezes, crackles, or rhonchi.  Heart:  Regular rate and rhythm;  Without murmur, clicks, rubs or gallops Abdomen:  Soft, nondistended, nontender. Normal bowel sounds. No appreciable masses or hepatomegaly.  No rebound or guarding.  Rectal:  Not performed.no blood seen on the pad,  Extremities:  Without edema, cyanosis or clubbing. Neurologic:  Alert and oriented x3;  grossly normal neurologically. Skin:  Intact without significant lesions or rashes. Cervical Nodes:  No significant cervical adenopathy. Psych:  Alert and cooperative. Normal affect.  LAB RESULTS:  Recent Labs  11/25/16 0316 11/26/16 0142  WBC 10.7* 9.4  HGB 10.5* 9.5*  HCT 30.2* 27.9*  PLT 154 153   BMET  Recent Labs  11/25/16 0316  NA 137  K 4.2  CL 98*  CO2 27  GLUCOSE 326*  BUN 63*  CREATININE 5.93*  CALCIUM 9.1   LFT No results for input(s): PROT, ALBUMIN, AST, ALT, ALKPHOS, BILITOT, BILIDIR, IBILI in the last 72  hours. PT/INR  Recent Labs  11/26/16 0020 11/26/16 0142  LABPROT 14.9 14.0  INR 1.16 1.08    STUDIES: Dg Chest Port 1 View  Result Date: 11/25/2016 CLINICAL DATA:  Chest pain. EXAM: PORTABLE CHEST 1 VIEW COMPARISON:  PET-CT 10/22/2016.  Chest radiograph 10/20/2016 FINDINGS: Right pleural effusion has increased from prior exam, now moderate in size. Small left pleural effusion on prior CT is not well seen radiographically. The heart is enlarged. Mild interstitial edema. Atherosclerosis of the aortic arch. No pneumothorax. IMPRESSION: Increased right pleural effusion, now moderate in size. Cardiomegaly with mild interstitial edema. Electronically Signed   By: Jeb Levering M.D.   On: 11/25/2016 03:44      Impression / Plan:   Calvin Good is a 76 y.o. y/o male with admission for A fibb with RVR 1-2 episodes of painless rectal bleeding with blood seen on the pad, hasnt recurred. Was on Eloquis and Heparin yesterday . Father had colon cancer. Tagged RBC scan just completed is negative for an acute bleed.   Bleeding could have been from hemorrhoids or a diverticular bleed or from mucosal surface or from polyp/neoplasm. Presently he is short of breath on minimal exertion, on oxygen , wouldn't suggest a colonoscopy at this time, would be best to see him back as an outpatient and determine the timing . If he were to have further active bleeding then may have to reconsider as the benefits >> risks.   Monitor CBC.    Thank you for involving me in the care of this patient.      LOS: 1 day   Jonathon Bellows, MD  11/26/2016, 1:36 PM

## 2016-11-26 NOTE — Progress Notes (Signed)
[facility]  SUBJECTIVE: Patient denies chest pain, shortness of breath, palpitations. He reports nausea with ambulation. He was noted to have moderate amount of red blood on incontinence pad this morning. Patient denies dysuria, pelvic pain, urethral or scrotal pain.    Vitals:   11/25/16 2200 11/25/16 2238 11/26/16 0356 11/26/16 0822  BP: (!) 166/61 (!) 168/58 (!) 170/56 (!) 140/40  Pulse: 88 95 85 85  Resp: 15 18 18    Temp: 98 F (36.7 C) 98 F (36.7 C) 97.8 F (36.6 C)   TempSrc: Oral Oral Oral   SpO2: 97% 97% 97% 95%  Weight: 97.9 kg (215 lb 13.3 oz) 96.8 kg (213 lb 4.8 oz) 98.4 kg (217 lb)   Height:         Intake/Output Summary (Last 24 hours) at 11/26/16 1140 Last data filed at 11/26/16 0359  Gross per 24 hour  Intake                0 ml  Output             1875 ml  Net            -1875 ml      PHYSICAL EXAM  General: Resting comfortably in bed, In no acute distress HEENT:  Normocephalic and atramatic Neck:  No JVD.  Lungs: Normal effort of breathing on supplemental oxygen. Right basilar crackles. No wheezing Heart: Irregularly irregular Abdomen: Bowel sounds are positive, abdomen soft Msk:  Back normal, able to turn in bed. Gait not assessed. Extremities: 1+ bilateral lower extremity edema Neuro: Alert and oriented X 3. Psych:  Good affect, responds appropriately   LABS: Basic Metabolic Panel:  Recent Labs  11/25/16 0316 11/25/16 1907  NA 137  --   K 4.2  --   CL 98*  --   CO2 27  --   GLUCOSE 326*  --   BUN 63*  --   CREATININE 5.93*  --   CALCIUM 9.1  --   PHOS  --  4.8*   Liver Function Tests: No results for input(s): AST, ALT, ALKPHOS, BILITOT, PROT, ALBUMIN in the last 72 hours. No results for input(s): LIPASE, AMYLASE in the last 72 hours. CBC:  Recent Labs  11/25/16 0316 11/26/16 0142  WBC 10.7* 9.4  HGB 10.5* 9.5*  HCT 30.2* 27.9*  MCV 86.5 85.3  PLT 154 153   Cardiac Enzymes:  Recent Labs  11/25/16 0316  11/25/16 1919 11/26/16 0142  TROPONINI 0.03* 0.82* 0.48*   BNP: Invalid input(s): POCBNP D-Dimer: No results for input(s): DDIMER in the last 72 hours. Hemoglobin A1C:  Recent Labs  11/25/16 0316  HGBA1C 8.4*   Fasting Lipid Panel: No results for input(s): CHOL, HDL, LDLCALC, TRIG, CHOLHDL, LDLDIRECT in the last 72 hours. Thyroid Function Tests:  Recent Labs  11/25/16 0316  TSH 2.458   Anemia Panel: No results for input(s): VITAMINB12, FOLATE, FERRITIN, TIBC, IRON, RETICCTPCT in the last 72 hours.  Dg Chest Port 1 View  Result Date: 11/25/2016 CLINICAL DATA:  Chest pain. EXAM: PORTABLE CHEST 1 VIEW COMPARISON:  PET-CT 10/22/2016.  Chest radiograph 10/20/2016 FINDINGS: Right pleural effusion has increased from prior exam, now moderate in size. Small left pleural effusion on prior CT is not well seen radiographically. The heart is enlarged. Mild interstitial edema. Atherosclerosis of the aortic arch. No pneumothorax. IMPRESSION: Increased right pleural effusion, now moderate in size. Cardiomegaly with mild interstitial edema. Electronically Signed   By: Fonnie Birkenhead.D.  On: 11/25/2016 03:44     Echo: Pending  TELEMETRY: Sinus rhythm, rate 84 bpm  ASSESSMENT AND PLAN:  Active Problems:   Atrial fibrillation with RVR (HCC)    1. Atrial fibrillation with rapid ventricular response, chads vasc score 5, not previously on chronic anticoagulation due to expense; in normal sinus rhythm now. Moderate amount of blood noted on incontinence pad this morning. Warfarin recommended due to patient's ESRD on dialysis, however, with recent bleeding while on heparin, will need to defer starting for now. Hemoglobin 9.5 today from 10.5 2. Borderline elevated troponin of 0.03, 0.82, and 0.48, currently without chest pain, in the setting of ESRD on dialysis 3. ESRD on dialysis 4. Hypertension 5. Coronary artery disease, status post cardiac catheterization on 08/21/16, which revealed  diffuse disease RCA with collaterals from the LAD, and 75% stenosis proximal left circumflex; medical management was recommended.  6. Increased right pleural effusion, moderate in size 7. COPD on chronic oxygen supplement   Recommendations: 1. Recommend discontinuing heparin as patient is not having chest pain, troponin borderline elevated in the setting of ESRD on dialysis, and patient had recent bleeding this morning 2. Hold off on warfarin for now with patient's recent bleeding; can be discussed further as outpatient when patient follows up with Dr. Clayborn Bigness 3. Review 2D echocardigram 4. Await GI recommendations   Sharolyn Douglas  11/26/2016 11:40 AM

## 2016-11-26 NOTE — Plan of Care (Signed)
Problem: Pain Managment: Goal: General experience of comfort will improve Outcome: Progressing No complaints of pain since picking this pt up at 1500. Will continue to monitor.  Problem: Bowel/Gastric: Goal: Will not experience complications related to bowel motility Outcome: Not Progressing Pt complaining of constipation hasn't had BM in 2 days, gave pt prune juice

## 2016-11-26 NOTE — Progress Notes (Signed)
Patient noted he had a moderate amount of blood on a pad he was using in his underwear for incontinence.  Dr. Saralyn Pilar and Dr. Tressia Miners aware. Continue to monitor.

## 2016-11-26 NOTE — Progress Notes (Signed)
Inpatient Diabetes Program Recommendations  AACE/ADA: New Consensus Statement on Inpatient Glycemic Control (2015)  Target Ranges:  Prepandial:   less than 140 mg/dL      Peak postprandial:   less than 180 mg/dL (1-2 hours)      Critically ill patients:  140 - 180 mg/dL   Lab Results  Component Value Date   GLUCAP 280 (H) 11/26/2016   HGBA1C 8.4 (H) 11/25/2016    Review of Glycemic ControlResults for Calvin Good, Calvin Good (MRN [id number]) as of 11/26/2016 12:07  Ref. Range 11/25/2016 12:21 11/25/2016 17:15 11/25/2016 22:36 11/26/2016 07:51 11/26/2016 11:46  Glucose-Capillary Latest Ref Range: 65 - 99 mg/dL 304 (H) 264 (H) 168 (H) 198 (H) 280 (H)   Home DM Meds: NPH Insulin 65 units BID                             Humalog 10 units TID  Current Insulin Orders: Novolog Sensitive Correction Scale/ SSI (0-9 units) TID AC + HS                                        Levemir 20 units bid   Inpatient Diabetes Program Recommendations:   Please consider increasing Levemir to 30 units bid and add Novolog meal coverage 5 units tid with meals (meal coverage to be held if patient eats less than 50%).   Thanks, Adah Perl, RN, BC-ADM Inpatient Diabetes Coordinator Pager [phone] (8a-5p)

## 2016-11-26 NOTE — Progress Notes (Signed)
[facility]  ROUNDING NOTE   Subjective:  Heart rate now appears to be under good control. However patient developed some rectal bleeding this a.m. He is unclear as to whether he had his last colonoscopy. He was taken off of heparin drip this a.m.  Objective:  Vital signs in last 24 hours:  Temp:  [97.8 F (36.6 C)-98 F (36.7 C)] 97.8 F (36.6 C) (08/28 0356) Pulse Rate:  [77-95] 85 (08/28 0822) Resp:  [13-22] 18 (08/28 0356) BP: (140-171)/(40-64) 140/40 (08/28 0822) SpO2:  [94 %-98 %] 95 % (08/28 0822) Weight:  [96.8 kg (213 lb 4.8 oz)-99 kg (218 lb 4.1 oz)] 98.4 kg (217 lb) (08/28 0356)  Weight change: 0 kg (0 lb) Filed Weights   11/25/16 2200 11/25/16 2238 11/26/16 0356  Weight: 97.9 kg (215 lb 13.3 oz) 96.8 kg (213 lb 4.8 oz) 98.4 kg (217 lb)    Intake/Output: I/O last 3 completed shifts: In: 120 [P.O.:120] Out: 1875 [Urine:175; Other:1700]   Intake/Output this shift:  No intake/output data recorded.  Physical Exam: General: No acute distress  Head: Normocephalic, atraumatic. Moist oral mucosal membranes  Eyes: Anicteric  Neck: Supple, trachea midline  Lungs:  Diminished at right base, normal effort   Heart: S1S2 irregular  Abdomen:  Soft, nontender, bowel sounds present  Extremities: Trace peripheral edema.  Neurologic: Awake, alert, following commands  Skin: No lesions  Access: LUE AVF    Basic Metabolic Panel:  Recent Labs Lab 11/25/16 0316 11/25/16 1907  NA 137  --   K 4.2  --   CL 98*  --   CO2 27  --   GLUCOSE 326*  --   BUN 63*  --   CREATININE 5.93*  --   CALCIUM 9.1  --   PHOS  --  4.8*    Liver Function Tests: No results for input(s): AST, ALT, ALKPHOS, BILITOT, PROT, ALBUMIN in the last 168 hours. No results for input(s): LIPASE, AMYLASE in the last 168 hours. No results for input(s): AMMONIA in the last 168 hours.  CBC:  Recent Labs Lab 11/25/16 0316 11/26/16 0142  WBC 10.7* 9.4  HGB 10.5* 9.5*  HCT 30.2*  27.9*  MCV 86.5 85.3  PLT 154 153    Cardiac Enzymes:  Recent Labs Lab 11/25/16 0316 11/25/16 1919 11/26/16 0142  TROPONINI 0.03* 0.82* 0.48*    BNP: Invalid input(s): POCBNP  CBG:  Recent Labs Lab 11/25/16 1221 11/25/16 1715 11/25/16 2236 11/26/16 0751 11/26/16 1146  GLUCAP 304* 264* 168* 198* 280*    Microbiology: Results for orders placed or performed during the hospital encounter of 11/25/16  MRSA PCR Screening     Status: None   Collection Time: 11/25/16  7:00 AM  Result Value Ref Range Status   MRSA by PCR NEGATIVE NEGATIVE Final    Comment:        The GeneXpert MRSA Assay (FDA approved for NASAL specimens only), is one component of a comprehensive MRSA colonization surveillance program. It is not intended to diagnose MRSA infection nor to guide or monitor treatment for MRSA infections.     Coagulation Studies:  Recent Labs  11/26/16 0020 11/26/16 0142  LABPROT 14.9 14.0  INR 1.16 1.08    Urinalysis: No results for input(s): COLORURINE, LABSPEC, PHURINE, GLUCOSEU, HGBUR, BILIRUBINUR, KETONESUR, PROTEINUR, UROBILINOGEN, NITRITE, LEUKOCYTESUR in the last 72 hours.  Invalid input(s): APPERANCEUR    Imaging: Dg Chest Port 1 View  Result Date: 11/25/2016 CLINICAL DATA:  Chest pain. EXAM: PORTABLE  CHEST 1 VIEW COMPARISON:  PET-CT 10/22/2016.  Chest radiograph 10/20/2016 FINDINGS: Right pleural effusion has increased from prior exam, now moderate in size. Small left pleural effusion on prior CT is not well seen radiographically. The heart is enlarged. Mild interstitial edema. Atherosclerosis of the aortic arch. No pneumothorax. IMPRESSION: Increased right pleural effusion, now moderate in size. Cardiomegaly with mild interstitial edema. Electronically Signed   By: Jeb Levering M.D.   On: 11/25/2016 03:44     Medications:    . amLODipine  5 mg Oral QHS  . aspirin EC  81 mg Oral Daily  . atorvastatin  20 mg Oral QHS  . calcium acetate   1,334 mg Oral TID WC  . carvedilol  6.25 mg Oral BID  . doxazosin  8 mg Oral QHS  . furosemide  40 mg Oral BID  . insulin aspart  0-5 Units Subcutaneous QHS  . insulin aspart  0-9 Units Subcutaneous TID WC  . insulin detemir  20 Units Subcutaneous BID  . isosorbide mononitrate  30 mg Oral Daily  . lisinopril  20 mg Oral QHS  . pantoprazole  40 mg Oral Daily  . spironolactone  25 mg Oral BID   acetaminophen **OR** acetaminophen, ipratropium-albuterol  Assessment/ Plan:  76 y.o. male with diabetes mellitus type II insulin dependent, hypertension, coronary artery disease, hyperlipidemia, prostate cancer status post prostatectomy, right total knee, appendectomy.   MWF [facility] [place] [facility]  1. End Stage Renal Disease MWF: - Patient had hemodialysis yesterday. No acute indication for dialysis today. We will plan for dialysis again tomorrow.  2. Anemia of chronic kidney disease/Acute GI bleed 11/26/16.  Patient had some rectal bleeding this a.m.  Repeat hemoglobin currently 9.5. We will need to continue to monitor this closely. Recommend Urology consultation.  3. 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  - Management as per hospitalist.    4. Secondary Hyperparathyroidism:  - Phosphorous at target of 4.8. Continue calcium acetate.  5. A Fib with RVR:  Maintain the patient on carvedilol.   LOS: 1 Alania Overholt 8/28/201812:06 PM

## 2016-11-27 LAB — CBC
HEMATOCRIT: 28 % — AB (ref 40.0–52.0)
HEMOGLOBIN: 9.6 g/dL — AB (ref 13.0–18.0)
MCH: 29.7 pg (ref 26.0–34.0)
MCHC: 34.1 g/dL (ref 32.0–36.0)
MCV: 87.1 fL (ref 80.0–100.0)
Platelets: 146 10*3/uL — ABNORMAL LOW (ref 150–440)
RBC: 3.22 MIL/uL — AB (ref 4.40–5.90)
RDW: 15.3 % — ABNORMAL HIGH (ref 11.5–14.5)
WBC: 7.4 10*3/uL (ref 3.8–10.6)

## 2016-11-27 LAB — BASIC METABOLIC PANEL
Anion gap: 9 (ref 5–15)
BUN: 57 mg/dL — AB (ref 6–20)
CHLORIDE: 96 mmol/L — AB (ref 101–111)
CO2: 30 mmol/L (ref 22–32)
CREATININE: 5.42 mg/dL — AB (ref 0.61–1.24)
Calcium: 8.5 mg/dL — ABNORMAL LOW (ref 8.9–10.3)
GFR calc Af Amer: 11 mL/min — ABNORMAL LOW (ref >60)
GFR calc non Af Amer: 9 mL/min — ABNORMAL LOW (ref >60)
GLUCOSE: 110 mg/dL — AB (ref 65–99)
POTASSIUM: 3.7 mmol/L (ref 3.5–5.1)
Sodium: 135 mmol/L (ref 135–145)

## 2016-11-27 LAB — GLUCOSE, CAPILLARY: Glucose-Capillary: 87 mg/dL (ref 65–99)

## 2016-11-27 LAB — ECHOCARDIOGRAM COMPLETE
Height: 69 in
Weight: 3472 oz

## 2016-11-27 LAB — PHOSPHORUS: Phosphorus: 5.2 mg/dL — ABNORMAL HIGH (ref 2.5–4.6)

## 2016-11-27 MED ORDER — ISOSORBIDE MONONITRATE ER 60 MG PO TB24: 60.0000 mg | ORAL_TABLET | Freq: Every day | ORAL | 2 refills | Status: DC

## 2016-11-27 MED ORDER — TRAMADOL HCL 50 MG PO TABS: 50.0000 mg | ORAL_TABLET | Freq: Two times a day (BID) | ORAL | 0 refills | Status: DC | PRN

## 2016-11-27 NOTE — Progress Notes (Signed)
Patient off unit for HD. Will resume care upon return. Calvin Good 

## 2016-11-27 NOTE — Progress Notes (Signed)
PT Cancellation Note  Patient Details Name: Marino Rogerson MRN: [id number] DOB: Apr 14, 1940   Cancelled Treatment:    Reason Eval/Treat Not Completed: Patient at procedure or test/unavailable (Evaluation re-attempted.  Patient currently off unit for dialysis and unable to participate.  Will re-attempt at later time/date as medically appropriate and available.)   Josephene Marrone H. Owens Shark, PT, DPT, NCS 11/27/16, 9:25 AM [phone]

## 2016-11-27 NOTE — Progress Notes (Signed)
[facility]  SUBJECTIVE: Patient denies chest pain. He has shortness of breath with minimal exertion, on chronic 02. He denies recurrence of rectal bleeding. He reports generalized fatigue and weakness.    Vitals:   11/26/16 1429 11/26/16 1943 11/27/16 0500 11/27/16 0523  BP: (!) 139/58 (!) 150/65  (!) 164/58  Pulse:  91  79  Resp:      Temp:  97.8 F (36.6 C)  97.9 F (36.6 C)  TempSrc:  Oral  Oral  SpO2:  91%  (!) 88%  Weight:   100 kg (220 lb 6.4 oz)   Height:         Intake/Output Summary (Last 24 hours) at 11/27/16 4098 Last data filed at 11/26/16 2230  Gross per 24 hour  Intake              664 ml  Output                0 ml  Net              664 ml      PHYSICAL EXAM  General: In no acute distress HEENT:  Normocephalic and atramatic Neck:  No JVD.  Lungs: Clear bilaterally to auscultation. Normal effort of breathing with supplemental oxygen. No wheezing or crackles. Heart: HRRR . Normal S1 and S2 without gallops or murmurs.  Abdomen: Bowel sounds are positive, abdomen soft and non-tender  Msk:  Back normal, sitting on side of bed. Strength and tone normal for age. Extremities: No clubbing, cyanosis or edema.   Neuro: Alert and oriented X 3. Psych:  Good affect, responds appropriately   LABS: Basic Metabolic Panel:  Recent Labs  11/25/16 0316 11/25/16 1907 11/27/16 0626  NA 137  --  135  K 4.2  --  3.7  CL 98*  --  96*  CO2 27  --  30  GLUCOSE 326*  --  110*  BUN 63*  --  57*  CREATININE 5.93*  --  5.42*  CALCIUM 9.1  --  8.5*  PHOS  --  4.8*  --    Liver Function Tests: No results for input(s): AST, ALT, ALKPHOS, BILITOT, PROT, ALBUMIN in the last 72 hours. No results for input(s): LIPASE, AMYLASE in the last 72 hours. CBC:  Recent Labs  11/26/16 0142 11/26/16 1726 11/27/16 0626  WBC 9.4  --  7.4  HGB 9.5* 10.3* 9.6*  HCT 27.9*  --  28.0*  MCV 85.3  --  87.1  PLT 153  --  146*   Cardiac Enzymes:  Recent Labs  11/25/16 0316  11/25/16 1919 11/26/16 0142  TROPONINI 0.03* 0.82* 0.48*   BNP: Invalid input(s): POCBNP D-Dimer: No results for input(s): DDIMER in the last 72 hours. Hemoglobin A1C:  Recent Labs  11/25/16 0316  HGBA1C 8.4*   Fasting Lipid Panel: No results for input(s): CHOL, HDL, LDLCALC, TRIG, CHOLHDL, LDLDIRECT in the last 72 hours. Thyroid Function Tests:  Recent Labs  11/25/16 0316  TSH 2.458   Anemia Panel: No results for input(s): VITAMINB12, FOLATE, FERRITIN, TIBC, IRON, RETICCTPCT in the last 72 hours.  Nm Gi Blood Loss  Result Date: 11/26/2016 CLINICAL DATA:  Blood on diaper/pad, question GI bleed, has been on heparin, which was discontinued. History of prostate cancer, CHF, diabetes mellitus, hypertension EXAM: NUCLEAR MEDICINE GASTROINTESTINAL BLEEDING SCAN TECHNIQUE: Sequential abdominal images were obtained following intravenous administration of Tc-45m labeled red blood cells. RADIOPHARMACEUTICALS:  22.892 mCi Tc-33m pertechnetate in-vitro labeled autologous red cells.  COMPARISON:  None ; radiographic correlation:  PET-CT 10/22/2016 FINDINGS: Imaging performed for 2 hours. Normal blood pool distribution of labeled red cells. No abnormal gastrointestinal localization of labeled erythrocytes identified to suggest active GI bleeding. IMPRESSION: Negative GI bleeding scan through 2 hours. Electronically Signed   By: Lavonia Dana M.D.   On: 11/26/2016 16:48     Echo: Pending  TELEMETRY: Sinus rhythm, rate 76 bpm  ASSESSMENT AND PLAN:  Active Problems:   Atrial fibrillation with RVR (HCC)    1. Atrial fibrillation with rapid ventricular rate, without recurrence, in sinus rhythm now. Chads vasc score of 5, not previously on anticoagulation due to expense.  2. ESRD on dialysis 3. Borderline elevated troponin of 0.03, 0.82, and finally 0.48, likely not ACS, in the absence of chest pain, in the setting of ESRD 4. Coronary artery disease, status post cardiac catheterization on  08/21/16, which revealed diffuse disease RCA with collaterals from the LAD, and 75% stenosis proximal left circumflex; medical management was recommended.  5. COPD on chronic O2, shortness of breath with minimal exertion 6. Recent episode of painless rectal bleeding while on heparin. GI suspects hemorrhoids or diverticular bleeding or from mucosal surface or from polyp/neoplasm.   Recommendations: 1. Continue current therapy 2. Will continue to hold off on anticoagulation in light of patient's recent rectal bleeding while on heparin.  3. Ambulate today 4. Review 2D echocardiogram  Clabe Seal, PA-C 11/27/2016 8:32 AM

## 2016-11-27 NOTE — Progress Notes (Signed)
[facility]  ROUNDING NOTE   Subjective:  Patient due for hemodialysis today. No further rectal bleeding. Patient to have colonoscopy most likely as an outpatient.  Objective:  Vital signs in last 24 hours:  Temp:  [97.8 F (36.6 C)-97.9 F (36.6 C)] 97.9 F (36.6 C) (08/29 0523) Pulse Rate:  [79-95] 80 (08/29 1030) Resp:  [15-16] 16 (08/29 1030) BP: (139-164)/(49-72) 144/54 (08/29 1030) SpO2:  [88 %-96 %] 94 % (08/29 1030) Weight:  [99.9 kg (220 lb 3.8 oz)-100 kg (220 lb 6.4 oz)] 99.9 kg (220 lb 3.8 oz) (08/29 0904)  Weight change: 0.973 kg (2 lb 2.3 oz) Filed Weights   11/26/16 0356 11/27/16 0500 11/27/16 0904  Weight: 98.4 kg (217 lb) 100 kg (220 lb 6.4 oz) 99.9 kg (220 lb 3.8 oz)    Intake/Output: I/O last 3 completed shifts: In: 161 [P.O.:560; I.V.:104] Out: 1875 [Urine:175; Other:1700]   Intake/Output this shift:  Total I/O In: 120 [P.O.:120] Out: -   Physical Exam: General: No acute distress  Head: Normocephalic, atraumatic. Moist oral mucosal membranes  Eyes: Anicteric  Neck: Supple, trachea midline  Lungs:  Diminished at right base, normal effort   Heart: S1S2 irregular  Abdomen:  Soft, nontender, bowel sounds present  Extremities: Trace peripheral edema.  Neurologic: Awake, alert, following commands  Skin: No lesions  Access: LUE AVF    Basic Metabolic Panel:  Recent Labs Lab 11/25/16 0316 11/25/16 1907 11/27/16 0626 11/27/16 0911  NA 137  --  135  --   K 4.2  --  3.7  --   CL 98*  --  96*  --   CO2 27  --  30  --   GLUCOSE 326*  --  110*  --   BUN 63*  --  57*  --   CREATININE 5.93*  --  5.42*  --   CALCIUM 9.1  --  8.5*  --   PHOS  --  4.8*  --  5.2*    Liver Function Tests: No results for input(s): AST, ALT, ALKPHOS, BILITOT, PROT, ALBUMIN in the last 168 hours. No results for input(s): LIPASE, AMYLASE in the last 168 hours. No results for input(s): AMMONIA in the last 168 hours.  CBC:  Recent Labs Lab  11/25/16 0316 11/26/16 0142 11/26/16 1726 11/27/16 0626  WBC 10.7* 9.4  --  7.4  HGB 10.5* 9.5* 10.3* 9.6*  HCT 30.2* 27.9*  --  28.0*  MCV 86.5 85.3  --  87.1  PLT 154 153  --  146*    Cardiac Enzymes:  Recent Labs Lab 11/25/16 0316 11/25/16 1919 11/26/16 0142  TROPONINI 0.03* 0.82* 0.48*    BNP: Invalid input(s): POCBNP  CBG:  Recent Labs Lab 11/26/16 1146 11/26/16 1653 11/26/16 1710 11/26/16 2049 11/27/16 0811  GLUCAP 280* 223* 214* 281* 9    Microbiology: Results for orders placed or performed during the hospital encounter of 11/25/16  MRSA PCR Screening     Status: None   Collection Time: 11/25/16  7:00 AM  Result Value Ref Range Status   MRSA by PCR NEGATIVE NEGATIVE Final    Comment:        The GeneXpert MRSA Assay (FDA approved for NASAL specimens only), is one component of a comprehensive MRSA colonization surveillance program. It is not intended to diagnose MRSA infection nor to guide or monitor treatment for MRSA infections.     Coagulation Studies:  Recent Labs  11/26/16 0020 11/26/16 0142  LABPROT 14.9 14.0  INR 1.16 1.08    Urinalysis: No results for input(s): COLORURINE, LABSPEC, PHURINE, GLUCOSEU, HGBUR, BILIRUBINUR, KETONESUR, PROTEINUR, UROBILINOGEN, NITRITE, LEUKOCYTESUR in the last 72 hours.  Invalid input(s): APPERANCEUR    Imaging: Nm Gi Blood Loss  Result Date: 11/26/2016 CLINICAL DATA:  Blood on diaper/pad, question GI bleed, has been on heparin, which was discontinued. History of prostate cancer, CHF, diabetes mellitus, hypertension EXAM: NUCLEAR MEDICINE GASTROINTESTINAL BLEEDING SCAN TECHNIQUE: Sequential abdominal images were obtained following intravenous administration of Tc-29m labeled red blood cells. RADIOPHARMACEUTICALS:  22.892 mCi Tc-52m pertechnetate in-vitro labeled autologous red cells. COMPARISON:  None ; radiographic correlation:  PET-CT 10/22/2016 FINDINGS: Imaging performed for 2 hours. Normal blood  pool distribution of labeled red cells. No abnormal gastrointestinal localization of labeled erythrocytes identified to suggest active GI bleeding. IMPRESSION: Negative GI bleeding scan through 2 hours. Electronically Signed   By: Lavonia Dana M.D.   On: 11/26/2016 16:48     Medications:    . amLODipine  5 mg Oral QHS  . aspirin EC  81 mg Oral Daily  . atorvastatin  20 mg Oral QHS  . calcium acetate  1,334 mg Oral TID WC  . carvedilol  6.25 mg Oral BID  . doxazosin  8 mg Oral QHS  . furosemide  40 mg Oral BID  . insulin aspart  0-5 Units Subcutaneous QHS  . insulin aspart  0-9 Units Subcutaneous TID WC  . insulin detemir  27 Units Subcutaneous BID  . isosorbide mononitrate  30 mg Oral Daily  . lisinopril  20 mg Oral QHS  . pantoprazole  40 mg Oral Daily  . polyethylene glycol  17 g Oral Daily  . spironolactone  25 mg Oral BID   acetaminophen **OR** acetaminophen, ipratropium-albuterol, traMADol  Assessment/ Plan:  76 y.o. male with diabetes mellitus type II insulin dependent, hypertension, coronary artery disease, hyperlipidemia, prostate cancer status post prostatectomy, right total knee, appendectomy.   MWF [facility] [place] [facility]  1. End Stage Renal Disease MWF: - Patient due for dialysis today. Orders have been prepared.  2. Anemia of chronic kidney disease/Acute GI bleed 11/26/16.  Hemoglobin roughly stable at 9.6. Will likely need colonoscopy as an outpatient given rectal bleeding yesterday.  3. 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  - Management as per hospitalist.    4. Secondary Hyperparathyroidism:  - Phosphorus remains at goal at 5.2. Continue Testim acetate.  5. A Fib with RVR:  Heart rate currently 80. Continue the patient on carvedilol.   LOS: 2 Calvin Good 8/29/201810:54 AM

## 2016-11-27 NOTE — Progress Notes (Signed)
Pre hd 

## 2016-11-27 NOTE — Progress Notes (Signed)
Post hd assessment unchanged  

## 2016-11-27 NOTE — Progress Notes (Signed)
Pre hd assessment. Patient currently has no complaints  

## 2016-11-27 NOTE — Discharge Summary (Signed)
[facility] NAME: Tahsin Benyo    MR#:  [id number]  DATE OF BIRTH:  September 30, 1940  DATE OF ADMISSION:  11/25/2016   ADMITTING PHYSICIAN: Harrie Foreman, MD  DATE OF DISCHARGE: 11/27/2016  PRIMARY CARE PHYSICIAN: [name], MD   ADMISSION DIAGNOSIS:   Pleural effusion [J90] Pain [R52] ESRD (end stage renal disease) on dialysis ([place]) [N18.6, Z99.2] Atrial fibrillation with RVR ([place]) [I48.91] Chest pain, unspecified type [R07.9]  DISCHARGE DIAGNOSIS:   Active Problems:   Atrial fibrillation with RVR ([place])   SECONDARY DIAGNOSIS:   Past Medical History:  Diagnosis Date  . Cancer [facility])    Prostate  . CHF (congestive heart failure) ([place])   . Chronic kidney disease   . Complication of anesthesia    hard to wake up  . COPD (chronic obstructive pulmonary disease) ([place])   . Diabetes mellitus without complication ([place])   . Difficult intubation   . Dyspnea   . GERD (gastroesophageal reflux disease)   . Hyperlipidemia   . Hypertension   . Pleural effusion   . Renal insufficiency     HOSPITAL COURSE:   76 year old male with past medical history significant for end-stage renal disease on hemodialysis, congestive heart failure, COPD  on 2L home oxygen, diabetes and hypertension presents to hospital secondary to chest pain and palpitations  #1 paroxysmal atrial fibrillation with rapid ventricular response- -Patient's rate is well controlled and he is in normal sinus rhythm at this time. -On Coreg twice a day for rate control. -unable to tolerate anticoagulation as had GI bleed while on heparin and eliquis, so anticoagulation being discontinued. Continue aspirin only.  #2 GI bleed-likely lower GI bleed. Secondary to IV heparin and received a dose of eliquis. -Last colonoscopy was a long time ago. Perineum examined with no open wounds. Urine was normal colored. -Negative GI bleeding scan. Slight drop in  hemoglobin after that has been stable. Colonoscopy advised as outpatient with his ongoing cardiac and respiratory issues at this time..  #3 diabetes mellitus-appreciate diabetes coordinator's input. on Levemir twice a day- also on Levemir at home. Unfortunately he was not added to his medication list. He can resume taking his Levemir.  #4 chronic systolic CHF with EF of 41%-DEYCXKGY medical therapy with Coreg, lisinopril, Aldactone and Lasix. Well compensated at this time -History of Right Pleural Effusion Status Post Tap in July 2018. Repeat Chest X-Ray This Admission Showing Moderate Pleural Effusion Again Building up. Advised Outpatient Follow-Up for Symptoms and Thoracentesis As Needed.  #4 end-stage renal disease on hemodialysis-appreciate nephrology input.  On Monday Wednesday Friday dialysis. Dialysis today per schedule  #5 COPD-stable at this time. On 3 L home oxygen -Continue inhalers. Continue outpatient pulmonary follow-up -History of pleural effusion recently status post thoracentesis Chest x-ray on admission showing again moderate right pleural effusion- outpatient follow up  #6  CAD-with occasional chest pain. Status post cardiac catheterization in May 2018 with diffuse RCA disease and collaterals LAD, left circumflex 75% stenosis. Troponins were elevated on admission, though plateaued. Cardiology recommended medical management. -Since ongoing chest pain intermittently, we will discontinue his Norvasc and increase his Imdur dose.  Patient will be discharged home with home health today  DISCHARGE CONDITIONS:   Guarded  CONSULTS OBTAINED:   Treatment Team:  Anthonette Legato, MD Isaias Cowman, MD  DRUG ALLERGIES:   Allergies  Allergen Reactions  . Tetracyclines & Related Other (See Comments)    Reaction: unknown happened a  long time ago and pt doesn't remember reaction   . Morphine Other (See Comments)    Other reaction(s): disoriented Reaction: confusion    DISCHARGE MEDICATIONS:   Allergies as of 11/27/2016      Reactions   Tetracyclines & Related Other (See Comments)   Reaction: unknown happened a long time ago and pt doesn't remember reaction    Morphine Other (See Comments)   Other reaction(s): disoriented Reaction: confusion      Medication List    STOP taking these medications   amLODipine 5 MG tablet Commonly known as:  NORVASC     TAKE these medications   acetaminophen 325 MG tablet Commonly known as:  TYLENOL Take 2 tablets (650 mg total) by mouth every 6 (six) hours as needed for mild pain (or Fever >/= 101).   aspirin EC 81 MG tablet Take 81 mg by mouth daily.   atorvastatin 20 MG tablet Commonly known as:  LIPITOR Take 20 mg by mouth at bedtime.   calcium acetate 667 MG capsule Commonly known as:  PHOSLO Take 1,334 mg by mouth 3 (three) times daily with meals.   carvedilol 6.25 MG tablet Commonly known as:  COREG Take 6.25 mg by mouth 2 (two) times daily. 8 am, 5 pm   dextrose 40 % Gel Commonly known as:  GLUTOSE Take 1 Tube by mouth every 30 (thirty) minutes as needed for low blood sugar. Or until BS > 70 and pt is still responsive.   doxazosin 8 MG tablet Commonly known as:  CARDURA Take 8 mg by mouth at bedtime.   furosemide 40 MG tablet Commonly known as:  LASIX Take 40 mg by mouth 2 (two) times daily.   GLUCAGON EMERGENCY 1 MG injection Generic drug:  glucagon Inject 1 mg into the muscle once as needed. If not responding to oral glucose, snack. May repeat x 1 after 15 minutes. Use only if pt is or is becoming unresponsive as needed.   insulin lispro 100 UNIT/ML injection Commonly known as:  HUMALOG Inject 10 Units into the skin 3 (three) times daily before meals.   ipratropium-albuterol 0.5-2.5 (3) MG/3ML Soln Commonly known as:  DUONEB Take 3 mLs by nebulization every 6 (six) hours as needed. What changed:  reasons to take this   isosorbide mononitrate 60 MG 24 hr tablet Commonly  known as:  IMDUR Take 1 tablet (60 mg total) by mouth daily. What changed:  medication strength  how much to take   Lidocaine-Prilocaine (Bulk) 2.5-2.5 % Crea Apply 1 application topically as needed. Apply to fistula site prior to dialysis treatments as needed   lisinopril 20 MG tablet Commonly known as:  PRINIVIL,ZESTRIL Take 20 mg by mouth at bedtime.   omeprazole 20 MG capsule Commonly known as:  PRILOSEC Take 20 mg by mouth 2 (two) times daily before a meal.   OXYGEN Inhale 2 L into the lungs continuous.   spironolactone 25 MG tablet Commonly known as:  ALDACTONE Take 25 mg by mouth 2 (two) times daily.   traMADol 50 MG tablet Commonly known as:  ULTRAM Take 1 tablet (50 mg total) by mouth every 12 (twelve) hours as needed for moderate pain or severe pain.            Discharge Care Instructions        Start     Ordered   11/27/16 0000  Diet - low sodium heart healthy     11/27/16 1220   11/27/16 0000  Activity  as tolerated - No restrictions     11/27/16 1220   11/27/16 0000  traMADol (ULTRAM) 50 MG tablet  Every 12 hours PRN     11/27/16 1349   11/27/16 0000  isosorbide mononitrate (IMDUR) 60 MG 24 hr tablet  Daily     11/27/16 1349       DISCHARGE INSTRUCTIONS:   1. PCP follow-up in 2 weeks 2. Cardiology follow-up and once 2 weeks 3. GI follow up in 2-3 weeks  DIET:   Cardiac diet and Renal diet  ACTIVITY:   Activity as tolerated  OXYGEN:   Home Oxygen: Yes.    Oxygen Delivery: 3 liters/min via Patient connected to nasal cannula oxygen  DISCHARGE LOCATION:   home   If you experience worsening of your admission symptoms, develop shortness of breath, life threatening emergency, suicidal or homicidal thoughts you must seek medical attention immediately by calling 911 or calling your MD immediately  if symptoms less severe.  You Must read complete instructions/literature along with all the possible adverse reactions/side effects for all  the Medicines you take and that have been prescribed to you. Take any new Medicines after you have completely understood and accpet all the possible adverse reactions/side effects.   Please note  You were cared for by a hospitalist during your hospital stay. If you have any questions about your discharge medications or the care you received while you were in the hospital after you are discharged, you can call the unit and asked to speak with the hospitalist on call if the hospitalist that took care of you is not available. Once you are discharged, your primary care physician will handle any further medical issues. Please note that NO REFILLS for any discharge medications will be authorized once you are discharged, as it is imperative that you return to your primary care physician (or establish a relationship with a primary care physician if you do not have one) for your aftercare needs so that they can reassess your need for medications and monitor your lab values.    On the day of Discharge:  VITAL SIGNS:   Blood pressure (!) 164/56, pulse 81, temperature 97.9 F (36.6 C), temperature source Oral, resp. rate 15, height 5\' 9"  (1.753 m), weight 98.4 kg (216 lb 14.9 oz), SpO2 96 %.  PHYSICAL EXAMINATION:   GENERAL:  76 y.o.-year-old patient lying in the bed with no acute distress.  EYES: Pupils equal, round, reactive to light and accommodation. No scleral icterus. Extraocular muscles intact.  HEENT: Head atraumatic, normocephalic. Oropharynx and nasopharynx clear.  NECK:  Supple, no jugular venous distention. No thyroid enlargement, no tenderness.  LUNGS: Normal breath sounds bilaterally, no wheezing, rales,rhonchi or crepitation. No use of accessory muscles of respiration. Decreased bibasilar breath sounds CARDIOVASCULAR: S1, S2 normal. No  rubs, or gallops. 2/6 systolic murmur ABDOMEN: Soft, nontender, nondistended. Bowel sounds present. No organomegaly or mass.  EXTREMITIES: No pedal edema,  cyanosis, or clubbing.  NEUROLOGIC: Cranial nerves II through XII are intact. Muscle strength 5/5 in all extremities. Sensation intact. Gait not checked.  PSYCHIATRIC: The patient is alert and oriented x 3.  SKIN: No obvious rash, lesion, or ulcer  DATA REVIEW:   CBC  Recent Labs Lab 11/27/16 0626  WBC 7.4  HGB 9.6*  HCT 28.0*  PLT 146*    Chemistries   Recent Labs Lab 11/27/16 0626  NA 135  K 3.7  CL 96*  CO2 30  GLUCOSE 110*  BUN 57*  CREATININE 5.42*  CALCIUM 8.5*     Microbiology Results  Results for orders placed or performed during the hospital encounter of 11/25/16  MRSA PCR Screening     Status: None   Collection Time: 11/25/16  7:00 AM  Result Value Ref Range Status   MRSA by PCR NEGATIVE NEGATIVE Final    Comment:        The GeneXpert MRSA Assay (FDA approved for NASAL specimens only), is one component of a comprehensive MRSA colonization surveillance program. It is not intended to diagnose MRSA infection nor to guide or monitor treatment for MRSA infections.     RADIOLOGY:  Nm Gi Blood Loss  Result Date: 11/26/2016 CLINICAL DATA:  Blood on diaper/pad, question GI bleed, has been on heparin, which was discontinued. History of prostate cancer, CHF, diabetes mellitus, hypertension EXAM: NUCLEAR MEDICINE GASTROINTESTINAL BLEEDING SCAN TECHNIQUE: Sequential abdominal images were obtained following intravenous administration of Tc-73m labeled red blood cells. RADIOPHARMACEUTICALS:  22.892 mCi Tc-22m pertechnetate in-vitro labeled autologous red cells. COMPARISON:  None ; radiographic correlation:  PET-CT 10/22/2016 FINDINGS: Imaging performed for 2 hours. Normal blood pool distribution of labeled red cells. No abnormal gastrointestinal localization of labeled erythrocytes identified to suggest active GI bleeding. IMPRESSION: Negative GI bleeding scan through 2 hours. Electronically Signed   By: Lavonia Dana M.D.   On: 11/26/2016 16:48     Management  plans discussed with the patient, family and they are in agreement.  CODE STATUS:     Code Status Orders        Start     Ordered   11/25/16 0600  Full code  Continuous     11/25/16 0559    Code Status History    Date Active Date Inactive Code Status Order ID Comments User Context   10/31/2016  1:48 AM 10/31/2016  7:29 PM Full Code [id number]  Saundra Shelling, MD Inpatient   10/16/2016 11:41 PM 10/21/2016 12:21 AM Full Code [id number]  Vaughan Basta, MD ED   09/14/2016  6:31 PM 09/24/2016  2:43 AM Full Code [id number]  Nicholes Mango, MD Inpatient   08/26/2016  6:45 AM 08/28/2016  8:11 PM Full Code [id number]  Saundra Shelling, MD Inpatient   08/20/2016  3:05 AM 08/22/2016  1:01 PM Full Code [id number]  Lance Coon, MD ED    Advance Directive Documentation     Most Recent Value  Type of Advance Directive  Healthcare Power of Attorney  Pre-existing out of facility DNR order (yellow form or pink MOST form)  -  "MOST" Form in Place?  -      TOTAL TIME TAKING CARE OF THIS PATIENT: 37 minutes.    Gerline Ratto M.D on 11/27/2016 at 2:19 PM  Between 7am to 6pm - Pager - [phone]  After 6pm go to [email or web address] - [employer] [facility]  Office  [phone]  CC: Primary care physician; [name], MD   Note: This dictation was prepared with Dragon dictation along with smaller phrase technology. Any transcriptional errors that result from this process are unintentional.

## 2016-11-27 NOTE — Discharge Instructions (Signed)

## 2016-11-27 NOTE — Progress Notes (Signed)
Patient desired to have oxygen tank to reach family member's car. Hooked patient to tank, NT to wheel downstairs to the visitor's entrance. However, family member picking up patient didn't bring portable oxygen tank to accompany patient 20 minutes to home. Suggested this family member go back home to pick up this tank, but patient refused to wait and wants to go now. Honored patient's desire to do this. Wenda Low [facility]

## 2016-11-27 NOTE — Care Management (Addendum)
Chronic  ESRD with HD at  [facility].  Patient currently receiving home health services through [facility] and PT.  At last discharge, there was a referral to [facility].  Reached out to agency to determine if care has been established- which it has.  Patient is full code and has had 6 admissions within the past 6 months. Anticipate discharge today.  Notified all agencies involved. Chronic home 02 with [facility]

## 2016-11-27 NOTE — Progress Notes (Signed)
HD initiated without issue via [facility].

## 2016-11-27 NOTE — Plan of Care (Signed)
Problem: Safety: Goal: Ability to remain free from injury will improve Outcome: Progressing  Patient's SBP has been in 150's-160's, other VSS. Patient denies pain. Patient rested well. No other concern at the moment. RN will continue to monitor.

## 2016-11-27 NOTE — Progress Notes (Signed)
Pleas note patient is currently receiving Home Palliative services. CMRN Joni Reining made aware. Hospital notes faxed to Palliative team. Thank you. Flo Shanks RN, BSN, [facility], hospital liaison [phone] c

## 2016-11-27 NOTE — Progress Notes (Signed)
HD completed without issue. Goal met. Tolerated well  

## 2016-11-28 LAB — HEPATITIS B SURFACE ANTIGEN: HEP B S AG: NEGATIVE

## 2016-12-03 ENCOUNTER — Other Ambulatory Visit: Payer: Self-pay | Admitting: Nephrology

## 2016-12-03 DIAGNOSIS — J90 Pleural effusion, not elsewhere classified: Secondary | ICD-10-CM

## 2016-12-04 ENCOUNTER — Encounter: Payer: Self-pay | Admitting: Emergency Medicine

## 2016-12-04 ENCOUNTER — Emergency Department
Admission: EM | Admit: 2016-12-04 | Discharge: 2016-12-04 | Disposition: A | Discharge status: Home / Self Care | Payer: Medicare Other | Attending: Emergency Medicine | Admitting: Emergency Medicine

## 2016-12-04 ENCOUNTER — Emergency Department: Payer: Medicare Other

## 2016-12-04 DIAGNOSIS — Z87891 Personal history of nicotine dependence: Secondary | ICD-10-CM | POA: Insufficient documentation

## 2016-12-04 DIAGNOSIS — E1122 Type 2 diabetes mellitus with diabetic chronic kidney disease: Secondary | ICD-10-CM | POA: Diagnosis not present

## 2016-12-04 DIAGNOSIS — I4891 Unspecified atrial fibrillation: Secondary | ICD-10-CM | POA: Diagnosis not present

## 2016-12-04 DIAGNOSIS — N186 End stage renal disease: Secondary | ICD-10-CM | POA: Diagnosis not present

## 2016-12-04 DIAGNOSIS — Z992 Dependence on renal dialysis: Secondary | ICD-10-CM | POA: Insufficient documentation

## 2016-12-04 DIAGNOSIS — I5032 Chronic diastolic (congestive) heart failure: Secondary | ICD-10-CM | POA: Insufficient documentation

## 2016-12-04 DIAGNOSIS — Z7982 Long term (current) use of aspirin: Secondary | ICD-10-CM | POA: Insufficient documentation

## 2016-12-04 DIAGNOSIS — J449 Chronic obstructive pulmonary disease, unspecified: Secondary | ICD-10-CM | POA: Diagnosis not present

## 2016-12-04 DIAGNOSIS — R5383 Other fatigue: Secondary | ICD-10-CM | POA: Diagnosis present

## 2016-12-04 DIAGNOSIS — I132 Hypertensive heart and chronic kidney disease with heart failure and with stage 5 chronic kidney disease, or end stage renal disease: Secondary | ICD-10-CM | POA: Insufficient documentation

## 2016-12-04 DIAGNOSIS — Z794 Long term (current) use of insulin: Secondary | ICD-10-CM | POA: Insufficient documentation

## 2016-12-04 DIAGNOSIS — Z79899 Other long term (current) drug therapy: Secondary | ICD-10-CM | POA: Insufficient documentation

## 2016-12-04 DIAGNOSIS — R531 Weakness: Secondary | ICD-10-CM | POA: Diagnosis not present

## 2016-12-04 LAB — CBC
HCT: 30.8 % — ABNORMAL LOW (ref 40.0–52.0)
HEMOGLOBIN: 10.7 g/dL — AB (ref 13.0–18.0)
MCH: 29.3 pg (ref 26.0–34.0)
MCHC: 34.6 g/dL (ref 32.0–36.0)
MCV: 84.7 fL (ref 80.0–100.0)
Platelets: 156 10*3/uL (ref 150–440)
RBC: 3.64 MIL/uL — ABNORMAL LOW (ref 4.40–5.90)
RDW: 14.9 % — ABNORMAL HIGH (ref 11.5–14.5)
WBC: 10 10*3/uL (ref 3.8–10.6)

## 2016-12-04 LAB — BASIC METABOLIC PANEL
ANION GAP: 10 (ref 5–15)
BUN: 28 mg/dL — ABNORMAL HIGH (ref 6–20)
CALCIUM: 8.8 mg/dL — AB (ref 8.9–10.3)
CO2: 27 mmol/L (ref 22–32)
Chloride: 97 mmol/L — ABNORMAL LOW (ref 101–111)
Creatinine, Ser: 2.8 mg/dL — ABNORMAL HIGH (ref 0.61–1.24)
GFR calc non Af Amer: 20 mL/min — ABNORMAL LOW (ref >60)
GFR, EST AFRICAN AMERICAN: 23 mL/min — AB (ref >60)
Glucose, Bld: 211 mg/dL — ABNORMAL HIGH (ref 65–99)
Potassium: 3.2 mmol/L — ABNORMAL LOW (ref 3.5–5.1)
Sodium: 134 mmol/L — ABNORMAL LOW (ref 135–145)

## 2016-12-04 LAB — TROPONIN I: Troponin I: 0.04 ng/mL (ref <0.03)

## 2016-12-04 MED ORDER — ONDANSETRON HCL 4 MG/2ML IJ SOLN
4.0000 mg | Freq: Once | INTRAMUSCULAR | Status: AC
  Administered 2016-12-04: 4 mg via INTRAVENOUS

## 2016-12-04 MED ORDER — DILTIAZEM HCL 25 MG/5ML IV SOLN
10.0000 mg | Freq: Once | INTRAVENOUS | Status: DC
  Filled 2016-12-04: qty 5

## 2016-12-04 MED ORDER — ONDANSETRON HCL 4 MG/2ML IJ SOLN
INTRAMUSCULAR | Status: AC
  Filled 2016-12-04: qty 2

## 2016-12-04 MED ORDER — DILTIAZEM HCL 25 MG/5ML IV SOLN
20.0000 mg | Freq: Once | INTRAVENOUS | Status: AC
Start: 2016-12-04 — End: 2016-12-04
  Administered 2016-12-04: 20 mg via INTRAVENOUS
  Filled 2016-12-04: qty 5

## 2016-12-04 MED ORDER — DEXTROSE 5 % IV SOLN
5.0000 mg/h | Freq: Once | INTRAVENOUS | Status: DC
  Filled 2016-12-04: qty 100

## 2016-12-04 NOTE — Discharge Instructions (Signed)
Please seek medical attention for any high fevers, chest pain, shortness of breath, change in behavior, persistent vomiting, bloody stool or any other new or concerning symptoms.  

## 2016-12-04 NOTE — ED Notes (Signed)
Pharmacy notified to send Cardizem dose.

## 2016-12-04 NOTE — ED Provider Notes (Signed)
[facility] Emergency Department Provider Note   ____________________________________________   I have reviewed the triage vital signs and the nursing notes.   HISTORY  Chief Complaint Fatigue   History limited by: Not Limited   HPI Calvin Good is a 76 y.o. male who presents to the emergency department today because of concerns for feeling unwell and fatigued. He states his symptoms have been going on for the past couple of days. They did get worse today. The patient states he has had shortness of breath although has been somewhat chronic and he does have a known pleural effusion. Daughter states that she noticed that his heart rate was fast and his regular today. He does have paroxysmal atrial fibrillation however is not on anticoagulation.Patient denies any recent fevers. Denies any pain in his chest.   Past Medical History:  Diagnosis Date  . Cancer [facility])    Prostate  . CHF (congestive heart failure) ([place])   . Chronic kidney disease   . Complication of anesthesia    hard to wake up  . COPD (chronic obstructive pulmonary disease) ([place])   . Diabetes mellitus without complication ([place])   . Difficult intubation   . Dyspnea   . GERD (gastroesophageal reflux disease)   . Hyperlipidemia   . Hypertension   . Pleural effusion   . Renal insufficiency     Patient Active Problem List   Diagnosis Date Noted  . Atrial fibrillation with RVR ([place]) 11/25/2016  . A-fib ([place]) 10/30/2016  . Allergic rhinitis due to pollen 10/29/2016  . Chronic airway obstruction ([place]) 10/29/2016  . Chronic fatigue syndrome 10/29/2016  . Dependence on renal dialysis ([place]) 10/29/2016  . Diabetic neuropathy ([place]) 10/29/2016  . Diabetic renal disease ([place]) 10/29/2016  . Heart failure ([place]) 10/29/2016  . Hyperglycemia due to type 2 diabetes mellitus ([place]) 10/29/2016  . Incontinence without sensory awareness 10/29/2016  . Long term current use of insulin ([place]) 10/29/2016  .  Obstructive sleep apnea 10/29/2016  . Urinary incontinence 10/29/2016  . Acute on chronic respiratory failure ([place]) 10/16/2016  . Anxiety 10/10/2016  . Acute encephalopathy   . HCAP (healthcare-associated pneumonia) 09/14/2016  . Tobacco use 09/03/2016  . Aneurysm ([place]) 08/28/2016  . Pseudoaneurysm following procedure ([place]) 08/26/2016  . Chest tightness 08/20/2016  . GERD without esophagitis 08/20/2016  . NSTEMI (non-ST elevated myocardial infarction) ([place]) 08/20/2016  . Cancer of prostate ([place]) 05/16/2016  . Chronic diastolic CHF (congestive heart failure) ([place]) 05/16/2016  . Diabetic polyneuropathy associated with type 2 diabetes mellitus ([place]) 05/16/2016  . Onychomycosis of toenail 05/13/2016  . Essential hypertension, benign 04/23/2016  . Hyperlipidemia 04/23/2016  . Type 2 diabetes mellitus with chronic kidney disease on chronic dialysis, with long-term current use of insulin ([place]) 04/23/2016  . ESRD (end stage renal disease) ([place]) 04/23/2016    Past Surgical History:  Procedure Laterality Date  . APPENDECTOMY    . AV FISTULA PLACEMENT Left 05/30/2016   Procedure: ARTERIOVENOUS (AV) FISTULA CREATION ( BRACHIOCEPHALIC );  Surgeon: [name], MD;  Location: [facility] ORS;  Service: Vascular;  Laterality: Left;  . CAPD INSERTION N/A 05/30/2016   Procedure: LAPAROSCOPIC INSERTION CONTINUOUS AMBULATORY PERITONEAL DIALYSIS  (CAPD) CATHETER;  Surgeon: [name], MD;  Location: [facility] ORS;  Service: Vascular;  Laterality: N/A;  . DIALYSIS/PERMA CATHETER INSERTION    . DIALYSIS/PERMA CATHETER REMOVAL N/A 08/21/2016   Procedure: Dialysis/Perma Catheter Removal;  Surgeon: [name], MD;  Location: [place] [facility];  Service: Cardiovascular;  Laterality: N/A;  . JOINT REPLACEMENT     right knee   . LEFT HEART CATH AND CORONARY ANGIOGRAPHY N/A 08/21/2016   Procedure: Left Heart Cath and Coronary Angiography possible PCI;  Surgeon: [name], MD;  Location: [place] [facility];   Service: Cardiovascular;  Laterality: N/A;  . postate removal     . PROSTATE SURGERY    . PSEUDOANERYSM COMPRESSION Right 08/27/2016   Procedure: Pseudoanerysm Compression;  Surgeon: Katha Cabal, MD;  Location: [place] [facility];  Service: Cardiovascular;  Laterality: Right;  . REMOVAL OF A DIALYSIS CATHETER N/A 08/08/2016   Procedure: REMOVAL OF A DIALYSIS CATHETER;  Surgeon: [name], MD;  Location: [facility] ORS;  Service: Vascular;  Laterality: N/A;    Prior to Admission medications   Medication Sig Start Date End Date Taking? Authorizing Provider  acetaminophen (TYLENOL) 325 MG tablet Take 2 tablets (650 mg total) by mouth every 6 (six) hours as needed for mild pain (or Fever >/= 101). 09/23/16   Nicholes Mango, MD  aspirin EC 81 MG tablet Take 81 mg by mouth daily.    [provider]  atorvastatin (LIPITOR) 20 MG tablet Take 20 mg by mouth at bedtime.     [provider]  calcium acetate (PHOSLO) 667 MG capsule Take 1,334 mg by mouth 3 (three) times daily with meals.     [provider]  carvedilol (COREG) 6.25 MG tablet Take 6.25 mg by mouth 2 (two) times daily. 8 am, 5 pm 09/05/16   [provider]  dextrose (GLUTOSE) 40 % GEL Take 1 Tube by mouth every 30 (thirty) minutes as needed for low blood sugar. Or until BS > 70 and pt is still responsive.    [provider]  doxazosin (CARDURA) 8 MG tablet Take 8 mg by mouth at bedtime.     [provider]  furosemide (LASIX) 40 MG tablet Take 40 mg by mouth 2 (two) times daily.     [provider]  glucagon (GLUCAGON EMERGENCY) 1 MG injection Inject 1 mg into the muscle once as needed. If not responding to oral glucose, snack. May repeat x 1 after 15 minutes. Use only if pt is or is becoming unresponsive as needed.    [provider]  insulin lispro (HUMALOG) 100 UNIT/ML injection Inject 10 Units into the skin 3 (three) times daily before meals.    [provider]   ipratropium-albuterol (DUONEB) 0.5-2.5 (3) MG/3ML SOLN Take 3 mLs by nebulization every 6 (six) hours as needed. Patient taking differently: Take 3 mLs by nebulization every 6 (six) hours as needed (shortness of breath).  09/23/16   Nicholes Mango, MD  isosorbide mononitrate (IMDUR) 60 MG 24 hr tablet Take 1 tablet (60 mg total) by mouth daily. 11/27/16   Gladstone Lighter, MD  Lidocaine-Prilocaine, Bulk, 2.5-2.5 % CREA Apply 1 application topically as needed. Apply to fistula site prior to dialysis treatments as needed    [provider]  lisinopril (PRINIVIL,ZESTRIL) 20 MG tablet Take 20 mg by mouth at bedtime.     [provider]  omeprazole (PRILOSEC) 20 MG capsule Take 20 mg by mouth 2 (two) times daily before a meal.     [provider]  OXYGEN Inhale 2 L into the lungs continuous.    [provider]  spironolactone (ALDACTONE) 25 MG tablet Take 25 mg by mouth 2 (two) times daily.     [provider]  traMADol (ULTRAM) 50 MG  tablet Take 1 tablet (50 mg total) by mouth every 12 (twelve) hours as needed for moderate pain or severe pain. 11/27/16 11/27/17  Gladstone Lighter, MD    Allergies Tetracyclines & related and Morphine  Family History  Problem Relation Age of Onset  . Colon cancer Father   . Brain cancer Mother   . CAD Neg Hx   . Diabetes Neg Hx     Social History Social History  Substance Use Topics  . Smoking status: Former Smoker    Types: Cigarettes    Quit date: 05/21/1982  . Smokeless tobacco: Never Used  . Alcohol use 1.8 oz/week    3 Glasses of wine per week    Review of Systems Constitutional: No fever/chills Eyes: No visual changes. ENT: No sore throat. Cardiovascular: Denies chest pain. Respiratory: Positive for shortness of breath.  Gastrointestinal: No abdominal pain.  No nausea, no vomiting.  No diarrhea.   Genitourinary: Negative for dysuria. Musculoskeletal: Negative for back pain. Skin: Negative for  rash. Neurological: Negative for headaches, focal weakness or numbness.  ____________________________________________   PHYSICAL EXAM:  VITAL SIGNS: ED Triage Vitals  Enc Vitals Group     BP -- 137/104     Pulse -- 153     Resp -- 17     Temp -- 98.9     Temp src --      SpO2 -- 95     Weight 12/04/16 2008 216 lb 0.8 oz (98 kg)     Height 12/04/16 2008 5\' 9"  (1.753 m)     Head Circumference --      Peak Flow --      Pain Score 12/04/16 2007 0   Constitutional: Alert and oriented. Well appearing and in no distress. Eyes: Conjunctivae are normal.  ENT   Head: Normocephalic and atraumatic.   Nose: No congestion/rhinnorhea.   Mouth/Throat: Mucous membranes are moist.   Neck: No stridor. Hematological/Lymphatic/Immunilogical: No cervical lymphadenopathy. Cardiovascular: Tachycardic, irregularly irregular rhythm.  No murmurs, rubs, or gallops. Respiratory: Normal respiratory effort without tachypnea nor retractions. Breath sounds are clear and equal bilaterally. No wheezes/rales/rhonchi. Gastrointestinal: Soft and non tender. No rebound. No guarding.  Genitourinary: Deferred Musculoskeletal: Normal range of motion in all extremities. No lower extremity edema. Neurologic:  Normal speech and language. No gross focal neurologic deficits are appreciated.  Skin:  Skin is warm, dry and intact. No rash noted. Psychiatric: Mood and affect are normal. Speech and behavior are normal. Patient exhibits appropriate insight and judgment.  ____________________________________________    LABS (pertinent positives/negatives)  Labs Reviewed  BASIC METABOLIC PANEL - Abnormal; Notable for the following:       Result Value   Sodium 134 (*)    Potassium 3.2 (*)    Chloride 97 (*)    Glucose, Bld 211 (*)    BUN 28 (*)    Creatinine, Ser 2.80 (*)    Calcium 8.8 (*)    GFR calc non Af Amer 20 (*)    GFR calc Af Amer 23 (*)    All other components within normal limits  CBC -  Abnormal; Notable for the following:    RBC 3.64 (*)    Hemoglobin 10.7 (*)    HCT 30.8 (*)    RDW 14.9 (*)    All other components within normal limits  TROPONIN I - Abnormal; Notable for the following:    Troponin I 0.04 (*)    All other components within normal limits  URINALYSIS, COMPLETE (UACMP)  WITH MICROSCOPIC  CBG MONITORING, ED     ____________________________________________   EKG  I, Nance Pear, attending physician, personally viewed and interpreted this EKG  EKG Time: 2013 Rate: 143 Rhythm: afib with rvr Axis: right axis deviation Intervals: qtc 547 QRS: wide ST changes: no st elevation Impression: abnormal ekg  Patient with history of RBBB on previous EKG ____________________________________________    RADIOLOGY  CXR IMPRESSION: Similar to increased moderate RIGHT pleural effusion with underlying consolidation.  Mild cardiomegaly and pulmonary vascular congestion.  ____________________________________________   PROCEDURES  Procedures  ____________________________________________   INITIAL IMPRESSION / ASSESSMENT AND PLAN / ED COURSE  Pertinent labs & imaging results that were available during my care of the patient were reviewed by me and considered in my medical decision making (see chart for details).  Patient presented to the emergency department today because of concerns for weakness. Patient initially was in A. Fib with RVR. Patient was given dose of diltiazem there. While in the emergency department he did spontaneously convert back to sinus rhythm. He did feel improvement after he went back to sinus rhythm. Troponin was minimally elevated. Discussed this with the patient. He did feel comfortable and desired to go home. I think this is reasonable. He has an appointment with his primary care doctor scheduled for tomorrow.   ____________________________________________   FINAL CLINICAL IMPRESSION(S) / ED DIAGNOSES  Final  diagnoses:  Weakness  Atrial fibrillation, unspecified type [facility])     Note: This dictation was prepared with Dragon dictation. Any transcriptional errors that result from this process are unintentional     Nance Pear, MD 12/04/16 [phone]

## 2016-12-04 NOTE — ED Triage Notes (Signed)
Pt in via [facility] from home; pt reports feeling extremely fatigued and weaker than normal since his dialysis treatment today.  Pt w/ uncontrolled Afibb upon arrival, hx of same.  Pt on 4-6L nasal cannula chronically.  Pt denies any pain at this time.  MD notified.

## 2016-12-05 ENCOUNTER — Ambulatory Visit
Admission: RE | Admit: 2016-12-05 | Discharge: 2016-12-05 | Disposition: A | Discharge status: Home / Self Care | Payer: Medicare Other | Source: Ambulatory Visit | Attending: Nephrology | Admitting: Nephrology

## 2016-12-05 ENCOUNTER — Ambulatory Visit
Admission: RE | Admit: 2016-12-05 | Discharge: 2016-12-05 | Disposition: A | Discharge status: Home / Self Care | Payer: Medicare Other | Source: Ambulatory Visit | Attending: Interventional Radiology | Admitting: Interventional Radiology

## 2016-12-05 ENCOUNTER — Ambulatory Visit: Payer: Medicare Other | Admitting: Podiatry

## 2016-12-05 DIAGNOSIS — J90 Pleural effusion, not elsewhere classified: Secondary | ICD-10-CM | POA: Diagnosis not present

## 2016-12-05 DIAGNOSIS — Z9889 Other specified postprocedural states: Secondary | ICD-10-CM | POA: Insufficient documentation

## 2016-12-05 LAB — BODY FLUID CELL COUNT WITH DIFFERENTIAL
Eos, Fluid: 1 %
Lymphs, Fluid: 53 %
Monocyte-Macrophage-Serous Fluid: 44 %
NEUTROPHIL FLUID: 2 %
WBC FLUID: 18 uL

## 2016-12-05 NOTE — Procedures (Signed)
Recurrent Rt effusion  S/p Rt thoracentesis  1.2 liters removed  No comp Stable cxr pending Full report in pacs

## 2016-12-06 LAB — CYTOLOGY - NON PAP

## 2016-12-16 ENCOUNTER — Ambulatory Visit (INDEPENDENT_AMBULATORY_CARE_PROVIDER_SITE_OTHER): Payer: Medicare Other | Admitting: Vascular Surgery

## 2016-12-26 ENCOUNTER — Encounter: Payer: Self-pay | Admitting: Podiatry

## 2016-12-26 ENCOUNTER — Ambulatory Visit (INDEPENDENT_AMBULATORY_CARE_PROVIDER_SITE_OTHER): Payer: Medicare Other | Admitting: Podiatry

## 2016-12-26 ENCOUNTER — Encounter: Payer: Self-pay | Admitting: Cardiothoracic Surgery

## 2016-12-26 ENCOUNTER — Ambulatory Visit (INDEPENDENT_AMBULATORY_CARE_PROVIDER_SITE_OTHER): Payer: Medicare Other | Admitting: Cardiothoracic Surgery

## 2016-12-26 VITALS — BP 157/73 | HR 76 | Temp 97.8°F | Ht 69.0 in | Wt 216.1 lb

## 2016-12-26 DIAGNOSIS — B351 Tinea unguium: Secondary | ICD-10-CM

## 2016-12-26 DIAGNOSIS — M205X9 Other deformities of toe(s) (acquired), unspecified foot: Secondary | ICD-10-CM | POA: Diagnosis not present

## 2016-12-26 DIAGNOSIS — E1149 Type 2 diabetes mellitus with other diabetic neurological complication: Secondary | ICD-10-CM | POA: Diagnosis not present

## 2016-12-26 DIAGNOSIS — J90 Pleural effusion, not elsewhere classified: Secondary | ICD-10-CM

## 2016-12-26 DIAGNOSIS — M2020 Hallux rigidus, unspecified foot: Secondary | ICD-10-CM

## 2016-12-26 DIAGNOSIS — M79676 Pain in unspecified toe(s): Secondary | ICD-10-CM

## 2016-12-26 NOTE — Patient Instructions (Signed)

## 2016-12-26 NOTE — Progress Notes (Signed)
Pleurx Patient Education was completed with Video and Return Demonstration. All questions were answered. Education included drainage of pleurx catheter, wound care and dressing changes, troubleshooting pleurx system, home health and patient responsibilities, how to obtain supplies and whom to contact if there is difficulty with supply orders, when to contact provider, when to return to clinic, pleurx insertion surgery details.  Patient and family members were given Welcome Folder and were instructed to call supply company upon patient being discharged from hospital.  Supply order form faxed with positive confirmation.  Home Health:  Time spent with patient: 30 Minutes

## 2016-12-26 NOTE — Progress Notes (Signed)
Patient ID: Calvin Good, male   DOB: 1941-01-16, 76 y.o.   MRN: [id number]  Chief Complaint  Patient presents with  . New Patient (Initial Visit)    Recurrent Pleural Effusion    Referred By Dr. Raul Del Reason for Referral recurrent right pleural effusion.  HPI Location, Quality, Duration, Severity, Timing, Context, Modifying Factors, Associated Signs and Symptoms.  Calvin Good is a 76 y.o. male.  He has a multitude of medical problems including heart failure, renal failure on dialysis, coronary artery disease and recurrent pleural effusions. He's had a right-sided thoracentesis performed 3 times in the last several months and he comes in today to discuss options for management. He is otherwise functional with moderate dyspnea with exertion. He states that after his prior thoracentesis he was able to do pretty much whatever he wanted. His daughter who accompanies him today states that he does have some limitations in his overall functional status at feels that he did get significant improvement with his prior thoracentesis. He's been on oxygen therapy as well. He's had stents of cardiac evaluation in the past. He had a GI bleed during a recent hospitalization and is no longer on anticoagulation for his atrial fibrillation. He does not smoke. He is not diabetic.   Past Medical History:  Diagnosis Date  . Cancer [facility])    Prostate  . CHF (congestive heart failure) ([place])   . Chronic kidney disease   . Complication of anesthesia    hard to wake up  . COPD (chronic obstructive pulmonary disease) ([place])   . Diabetes mellitus without complication ([place])   . Difficult intubation   . Dyspnea   . GERD (gastroesophageal reflux disease)   . Hyperlipidemia   . Hypertension   . Pleural effusion   . Renal insufficiency     Past Surgical History:  Procedure Laterality Date  . APPENDECTOMY    . AV FISTULA PLACEMENT Left 05/30/2016   Procedure: ARTERIOVENOUS (AV) FISTULA CREATION ( BRACHIOCEPHALIC  );  Surgeon: [name], MD;  Location: [facility] ORS;  Service: Vascular;  Laterality: Left;  . CAPD INSERTION N/A 05/30/2016   Procedure: LAPAROSCOPIC INSERTION CONTINUOUS AMBULATORY PERITONEAL DIALYSIS  (CAPD) CATHETER;  Surgeon: [name], MD;  Location: [facility] ORS;  Service: Vascular;  Laterality: N/A;  . DIALYSIS/PERMA CATHETER INSERTION    . DIALYSIS/PERMA CATHETER REMOVAL N/A 08/21/2016   Procedure: Dialysis/Perma Catheter Removal;  Surgeon: [name], MD;  Location: [place] [facility];  Service: Cardiovascular;  Laterality: N/A;  . JOINT REPLACEMENT     right knee   . LEFT HEART CATH AND CORONARY ANGIOGRAPHY N/A 08/21/2016   Procedure: Left Heart Cath and Coronary Angiography possible PCI;  Surgeon: [name], MD;  Location: [place] [facility];  Service: Cardiovascular;  Laterality: N/A;  . postate removal     . PROSTATE SURGERY    . PSEUDOANERYSM COMPRESSION Right 08/27/2016   Procedure: Pseudoanerysm Compression;  Surgeon: Katha Cabal, MD;  Location: [place] [facility];  Service: Cardiovascular;  Laterality: Right;  . REMOVAL OF A DIALYSIS CATHETER N/A 08/08/2016   Procedure: REMOVAL OF A DIALYSIS CATHETER;  Surgeon: [name], MD;  Location: [facility] ORS;  Service: Vascular;  Laterality: N/A;    Family History  Problem Relation Age of Onset  . Colon cancer Father   . Brain cancer Mother   . CAD Neg Hx   . Diabetes Neg Hx     Social History Social History  Substance Use Topics  .  Smoking status: Former Smoker    Types: Cigarettes    Quit date: 05/21/1982  . Smokeless tobacco: Never Used  . Alcohol use 1.8 oz/week    3 Glasses of wine per week    Allergies  Allergen Reactions  . Tetracyclines & Related Other (See Comments)    Reaction: Unknown     Current Outpatient Prescriptions  Medication Sig Dispense Refill  . acetaminophen (TYLENOL) 325 MG tablet Take 2 tablets (650 mg total) by mouth every 6 (six) hours as needed for mild pain (or Fever >/=  101).    Marland Kitchen amiodarone (PACERONE) 200 MG tablet Take 0.5 tablets by mouth daily.    Marland Kitchen apixaban (ELIQUIS) 5 MG TABS tablet Take 1 tablet by mouth every 12 (twelve) hours.    Marland Kitchen aspirin EC 81 MG tablet Take 81 mg by mouth daily.    Marland Kitchen atorvastatin (LIPITOR) 20 MG tablet Take 20 mg by mouth at bedtime.     . calcium acetate (PHOSLO) 667 MG capsule Take 1,334 mg by mouth 3 (three) times daily with meals.     . calcium acetate (PHOSLO) 667 MG capsule Take 1 capsule by mouth 3 (three) times daily with meals.    . carvedilol (COREG) 6.25 MG tablet Take 6.25 mg by mouth 2 (two) times daily. 8 am, 5 pm    . dextrose (GLUTOSE) 40 % GEL Take 1 Tube by mouth every 30 (thirty) minutes as needed for low blood sugar. Or until BS > 70 and pt is still responsive.    . doxazosin (CARDURA) 8 MG tablet Take 8 mg by mouth at bedtime.     . furosemide (LASIX) 40 MG tablet Take 40 mg by mouth 2 (two) times daily.     Marland Kitchen glucagon (GLUCAGON EMERGENCY) 1 MG injection Inject 1 mg into the muscle once as needed. If not responding to oral glucose, snack. May repeat x 1 after 15 minutes. Use only if pt is or is becoming unresponsive as needed.    . insulin lispro (HUMALOG) 100 UNIT/ML injection Inject 10 Units into the skin 3 (three) times daily before meals.    Marland Kitchen ipratropium-albuterol (DUONEB) 0.5-2.5 (3) MG/3ML SOLN Take 3 mLs by nebulization every 6 (six) hours as needed. (Patient taking differently: Take 3 mLs by nebulization every 6 (six) hours as needed (shortness of breath). ) 360 mL   . isosorbide mononitrate (IMDUR) 30 MG 24 hr tablet Take 1 tablet by mouth daily.    Marland Kitchen levalbuterol (XOPENEX HFA) 45 MCG/ACT inhaler Inhale 2 puffs into the lungs every 4 (four) hours as needed.    . Lidocaine-Prilocaine, Bulk, 2.5-2.5 % CREA Apply 1 application topically as needed. Apply to fistula site prior to dialysis treatments as needed    . lisinopril (PRINIVIL,ZESTRIL) 20 MG tablet Take 20 mg by mouth at bedtime.     Marland Kitchen omeprazole  (PRILOSEC) 20 MG capsule Take 20 mg by mouth 2 (two) times daily before a meal.     . OXYGEN Inhale 2-5 L into the lungs continuous.     Marland Kitchen spironolactone (ALDACTONE) 25 MG tablet Take 25 mg by mouth 2 (two) times daily.     . traMADol (ULTRAM) 50 MG tablet Take 1 tablet (50 mg total) by mouth every 12 (twelve) hours as needed for moderate pain or severe pain. 20 tablet 0   No current facility-administered medications for this visit.       Review of Systems A complete review of systems was asked and was  negative except for the following positive findingsChest pain, irregular heartbeat, swelling of his lower extremities, cough, shortness of breath.  Blood pressure (!) 157/73, pulse 76, temperature 97.8 F (36.6 C), temperature source Oral, height 5\' 9"  (1.753 m), weight 216 lb 0.8 oz (98 kg).  Physical Exam CONSTITUTIONAL:  Pleasant, well-developed, well-nourished, and in no acute distress. EYES: Pupils equal and reactive to light, Sclera non-icteric EARS, NOSE, MOUTH AND THROAT:  The oropharynx was clear.  Dentition is good repair.  Oral mucosa pink and moist. LYMPH NODES:  Lymph nodes in the neck and axillae were normal RESPIRATORY:  Lungs were clear on the left and diminished at the right base..  Normal respiratory effort without pathologic use of accessory muscles of respiration CARDIOVASCULAR: Heart was irregular with a short systolic murmur murmurs.  There were no carotid bruits.  There is a functioning AV fistula present in the left upper extremity this had a loud bruit and a palpable thrill GI: The abdomen was soft, nontender, and nondistended. There were no palpable masses. There was no hepatosplenomegaly. There were normal bowel sounds in all quadrants. GU:  Rectal deferred.   MUSCULOSKELETAL:  Normal muscle strength and tone.  No clubbing or cyanosis.   SKIN:  There were no pathologic skin lesions.  There were no nodules on palpation. NEUROLOGIC:  Sensation is normal.  Cranial  nerves are grossly intact. PSYCH:  Oriented to person, place and time.  Mood and affect are normal.  Data Reviewed Chest x-rays and CT scans  I have personally reviewed the patient's imaging, laboratory findings and medical records.    Assessment    Recurrent right-sided pleural effusion with multifactorial etiology including heart failure, renal failure.    Plan    I had a very long discussion with he and his daughter regarding the options. He initially was very interested in a Pleurx catheter but whenever the details were explained to the patient and his family he felt that this would be cumbersome to them. He also had experienced with a Tenckhoff catheter in the abdomen and that was ultimately removed so that he could undergo hemodialysis. He equates the 2 and therefore does not desire to have that as his first line of treatment. I explained to him the other options of repeated thoracentesis, general anesthesia with talc insufflation via thoracoscopy with or without Pleurx catheter insertion and finally a sedation with Pleurx catheter insertion with or without talc. I explained to him the indications and risks as well as the advantages and disadvantages of all the techniques. He is reluctant to pursue anything that would result in him having a permanent catheter. I did explain to him that in patients with renal failure or heart failure as the cause of the recurrent pleural effusions that talc was effective in about 80% of the patients. Therefore he wants to proceed on with a right thoracoscopy with talc pleurodesis and possible Pleurx catheter insertion. I spent a long period time with he and his daughter today reviewing the options. We will need to coordinate his dialysis as well as his cardiac clearance. We will plan on performing this procedure on October 8 with an admission to the hospital that morning for dialysis followed by surgery that afternoon. This all appeared to be satisfactory with  the patient and his daughter and they had all their questions answered.      Nestor Lewandowsky, MD 12/26/2016, 3:30 PM

## 2016-12-26 NOTE — Patient Instructions (Addendum)
We will need to get clearance from your cardiologist and nephrologist prior to surgery. I will contact you as soon as I speak with his nurse and find out what needs to be done for clearance.  Today we have discussed surgery for your Lung Surgery, Talc Pleurodesis, and Pleurx Catheter placement. This surgery is scheduled at [facility] on 01/06/17 with Dr. Genevive Bi. Our plan is to admit you first thing on the morning of 10/8, send you to dialysis, and then go to the Operating Room for surgery right after dialysis.  You have been provided a Pleurx Folder and DVD. Read through all information given and call with any questions or concerns.  PleurX Patient Navigators are available for any support questions [phone].    Thoracoscopy Thoracoscopy is a procedure to examine the lungs and other structures in the chest. This procedure uses a thin, lighted telescope with an attached camera (thoracoscope). This instrument is inserted through a small surgical incision in the chest wall. You may have thoracoscopy to:  Diagnose a chest disease.  Obtain a tissue sample to be examined under a microscope (biopsy).  Have medicines put directly into your lungs.  Remove samples of fluid, blood, or pus from your chest.  Have a minor surgical repair.  Tell a health care provider about:  Any allergies you have.  All medicines you are taking, including vitamins, herbs, eye drops, creams, and over-the-counter medicines.  Any problems you or family members have had with anesthetic medicines.  Any blood disorders you have.  Any surgeries you have had.  Any medical conditions you have. What are the risks? Generally, this is a safe procedure. However, problems may occur, including:  Bleeding. If this happens, your surgeon may need to open your chest with a large incision (thoracotomy) to control the bleeding.  Injury to nerves or other structures in your chest.  Collapsed lung. This can happen after the removal of  the chest tube that is used for the procedure. If this happens, the tube may need to be reinserted and left in place for a period of time.  Infection.  Heart rhythm abnormalities.  What happens before the procedure?  You may need to have tests, such as: ? Blood tests. ? Urine tests. ? Chest X-rays. ? Electrocardiogram (ECG).  Follow your health care provider's instructions about eating or drinking restrictions.  Ask your health care provider if you should plan to have someone take you home after the procedure.  Ask your health care provider about: ? Changing or stopping your regular medicines. This is especially important if you are taking diabetes medicines or blood thinners. ? Taking medicines such as aspirin and ibuprofen. These medicines can thin your blood. Do not take these medicines before your procedure if your health care provider instructs you not to. What happens during the procedure?  An IV tube will be inserted into one of your veins.  You will be given one of the following: ? A medicine that numbs the area (local anesthetic). If you are given a local anesthetic, you may also be given a medicine that makes you relax (sedative). ? A medicine that makes you fall asleep (general anesthetic).  Your chest area will be cleaned with a germ-killing solution (antiseptic).  Your surgeon will make two or three small incisions under your arm.  Your surgeon will place the thoracoscope through one of the incisions and use it to look inside your chest. The camera will project the images from the scope to a  TV monitor in the operating room.  Your surgeon will use the other incisions to insert long, thin surgical instruments to take a sample of lung tissue or do a minor repair.  The scope and instruments will be removed.  A small drain will be left in your chest to drain any blood or fluid that collects after surgery.  The incision will be closed with stitches (sutures).  A  bandage (dressing) may be placed over the incision area. The procedure may vary among health care providers and hospitals. What happens after the procedure?  You will spend some time in a recovery area until the anesthetic wears off.  You may be given medicine for pain as needed.  Your blood pressure, heart rate, breathing rate, and blood oxygen level will be monitored often until the medicines you were given have worn off.  Your drain may be removed before you go home. This information is not intended to replace advice given to you by your health care provider. Make sure you discuss any questions you have with your health care provider. Document Released: 12/15/2002 Document Revised: 11/18/2015 Document Reviewed: 12/01/2013 Elsevier Interactive Patient Education  2018 [place] of Pleurx Placement and Follow-up: 1. Your Nurse today will send a home supply request form to the medical supply- provider and an education referral form to [place] for you.   You will be educated to become independent in caring for your new device. 2. Home Health will touch base with you prior to your surgery (on most occasions,  unless surgery is urgent). If you do not hear from [facility] within 2  days, please call [phone]. 3. You will have your Pleurx Catheter placed and most likely spend 1-2 nights in  the hospital. Your catheter will be drained while hospitalized and the floor nurses will begin your educaion. 4. You will be discharged from the hospital with a Pleurx Kit with 4 bottles and  dressing kits. 5. When you return home, you should have a voicemail on your phone giving you a  phone number to call to set up delivery of supplies to your home. If you do not  receive this phone call, please call [phone], Option 1 New Patient, [place]. 6. You will be seen by a nurse back in the office for your first drainage, 2-3 days  after discharge and an additional appointment  10-14 days from your surgery for  your sutures to be removed.  You and your family will take over the drainage procedure at home and home health will be discharged within two weeks'  time.  7. We will touch base with your provider who ordered your catheter after your 1st  post-op and find out how often and how much they would like drained. We will  inform your home health nurse of this information. 8. [place] Nurse will begin helping with education of your Pleurx and  dressing changes for approximately 8-10 days.  Please refer to your [facility]) Pre-care sheet for further information.

## 2016-12-26 NOTE — Progress Notes (Signed)
Complaint:  Visit Type: Patient returns to my office for continued preventative foot care services. Complaint: Patient states" my nails have grown long and thick and become painful to walk and wear shoes" Patient has been diagnosed with DM with neuropathy.. The patient presents for preventative foot care services. No changes to ROS  Podiatric Exam: Vascular: dorsalis pedis and posterior tibial pulses are palpable bilateral. Capillary return is immediate. Temperature gradient is WNL. Skin turgor WNL  Sensorium: Normal Semmes Weinstein monofilament test. Normal tactile sensation bilaterally. Nail Exam: Pt has thick disfigured discolored nails with subungual debris noted bilateral entire nail hallux through fifth toenails Ulcer Exam: There is no evidence of ulcer or pre-ulcerative changes or infection. Orthopedic Exam: Muscle tone and strength are WNL. No limitations in general ROM. No crepitus or effusions noted. Foot type and digits show no abnormalities.  Hallux rigidis  B/L Skin: No Porokeratosis. No infection or ulcers  Diagnosis:  Onychomycosis, , Pain in right toe, pain in left toes,  Diabetes with angiopathy and neuropathy. Hallux rigidus  B/L   Treatment & Plan Procedures and Treatment: Consent by patient was obtained for treatment procedures. The patient understood the discussion of treatment and procedures well. All questions were answered thoroughly reviewed. Debridement of mycotic and hypertrophic toenails, 1 through 5 bilateral and clearing of subungual debris. No ulceration, no infection noted. Dispense diabetic shoes.  Patient presents today and was dispensed 0ne pair ( two units) of medically necessary extra depth shoes with three pair( six units) of custom molded multiple density inserts. The shoes and the inserts are fitted to the patients ' feet and are noted to fit well and are free of defect.  Length and width of the shoes are also acceptable.  Patient was given written and verbal   instructions for wearing.  If any concerns arrive with the shoes or inserts, the patient is to call the office.Patient is to follow up with doctor in six weeks. Return Visit-Office Procedure: Patient instructed to return to the office for a follow up visit 3 months for continued evaluation and treatment.    Gardiner Barefoot DPM

## 2016-12-27 ENCOUNTER — Ambulatory Visit: Payer: Self-pay | Admitting: Cardiothoracic Surgery

## 2016-12-30 ENCOUNTER — Emergency Department: Payer: Medicare Other

## 2016-12-30 ENCOUNTER — Inpatient Hospital Stay
Admission: EM | Admit: 2016-12-30 | Discharge: 2017-01-16 | DRG: 166 | Disposition: A | Discharge status: Skilled Nursing Facility | Payer: Medicare Other | Attending: Internal Medicine | Admitting: Internal Medicine

## 2016-12-30 ENCOUNTER — Telehealth: Payer: Self-pay

## 2016-12-30 ENCOUNTER — Inpatient Hospital Stay: Admission: RE | Admit: 2016-12-30 | Payer: Medicare Other | Source: Ambulatory Visit

## 2016-12-30 ENCOUNTER — Encounter: Payer: Self-pay | Admitting: Emergency Medicine

## 2016-12-30 ENCOUNTER — Telehealth: Payer: Self-pay | Admitting: Cardiothoracic Surgery

## 2016-12-30 ENCOUNTER — Other Ambulatory Visit: Payer: Self-pay

## 2016-12-30 DIAGNOSIS — E782 Mixed hyperlipidemia: Secondary | ICD-10-CM | POA: Diagnosis present

## 2016-12-30 DIAGNOSIS — E871 Hypo-osmolality and hyponatremia: Secondary | ICD-10-CM | POA: Diagnosis not present

## 2016-12-30 DIAGNOSIS — I480 Paroxysmal atrial fibrillation: Secondary | ICD-10-CM | POA: Diagnosis present

## 2016-12-30 DIAGNOSIS — Z79899 Other long term (current) drug therapy: Secondary | ICD-10-CM

## 2016-12-30 DIAGNOSIS — I4891 Unspecified atrial fibrillation: Secondary | ICD-10-CM

## 2016-12-30 DIAGNOSIS — J90 Pleural effusion, not elsewhere classified: Principal | ICD-10-CM

## 2016-12-30 DIAGNOSIS — I252 Old myocardial infarction: Secondary | ICD-10-CM

## 2016-12-30 DIAGNOSIS — J9600 Acute respiratory failure, unspecified whether with hypoxia or hypercapnia: Secondary | ICD-10-CM

## 2016-12-30 DIAGNOSIS — Z09 Encounter for follow-up examination after completed treatment for conditions other than malignant neoplasm: Secondary | ICD-10-CM

## 2016-12-30 DIAGNOSIS — I5043 Acute on chronic combined systolic (congestive) and diastolic (congestive) heart failure: Secondary | ICD-10-CM | POA: Diagnosis present

## 2016-12-30 DIAGNOSIS — E1122 Type 2 diabetes mellitus with diabetic chronic kidney disease: Secondary | ICD-10-CM | POA: Diagnosis present

## 2016-12-30 DIAGNOSIS — Z9079 Acquired absence of other genital organ(s): Secondary | ICD-10-CM | POA: Diagnosis not present

## 2016-12-30 DIAGNOSIS — E874 Mixed disorder of acid-base balance: Secondary | ICD-10-CM | POA: Diagnosis present

## 2016-12-30 DIAGNOSIS — Z7982 Long term (current) use of aspirin: Secondary | ICD-10-CM | POA: Diagnosis not present

## 2016-12-30 DIAGNOSIS — Z992 Dependence on renal dialysis: Secondary | ICD-10-CM | POA: Diagnosis not present

## 2016-12-30 DIAGNOSIS — K5900 Constipation, unspecified: Secondary | ICD-10-CM | POA: Diagnosis not present

## 2016-12-30 DIAGNOSIS — Z8546 Personal history of malignant neoplasm of prostate: Secondary | ICD-10-CM | POA: Diagnosis not present

## 2016-12-30 DIAGNOSIS — I248 Other forms of acute ischemic heart disease: Secondary | ICD-10-CM | POA: Diagnosis present

## 2016-12-30 DIAGNOSIS — R059 Cough, unspecified: Secondary | ICD-10-CM

## 2016-12-30 DIAGNOSIS — R05 Cough: Secondary | ICD-10-CM

## 2016-12-30 DIAGNOSIS — K219 Gastro-esophageal reflux disease without esophagitis: Secondary | ICD-10-CM | POA: Diagnosis present

## 2016-12-30 DIAGNOSIS — I25118 Atherosclerotic heart disease of native coronary artery with other forms of angina pectoris: Secondary | ICD-10-CM | POA: Diagnosis present

## 2016-12-30 DIAGNOSIS — N186 End stage renal disease: Secondary | ICD-10-CM

## 2016-12-30 DIAGNOSIS — R079 Chest pain, unspecified: Secondary | ICD-10-CM

## 2016-12-30 DIAGNOSIS — G9349 Other encephalopathy: Secondary | ICD-10-CM | POA: Diagnosis present

## 2016-12-30 DIAGNOSIS — J449 Chronic obstructive pulmonary disease, unspecified: Secondary | ICD-10-CM | POA: Diagnosis present

## 2016-12-30 DIAGNOSIS — Z87891 Personal history of nicotine dependence: Secondary | ICD-10-CM | POA: Diagnosis not present

## 2016-12-30 DIAGNOSIS — Z9981 Dependence on supplemental oxygen: Secondary | ICD-10-CM | POA: Diagnosis not present

## 2016-12-30 DIAGNOSIS — Z515 Encounter for palliative care: Secondary | ICD-10-CM | POA: Diagnosis present

## 2016-12-30 DIAGNOSIS — E875 Hyperkalemia: Secondary | ICD-10-CM | POA: Diagnosis not present

## 2016-12-30 DIAGNOSIS — J9621 Acute and chronic respiratory failure with hypoxia: Secondary | ICD-10-CM | POA: Diagnosis not present

## 2016-12-30 DIAGNOSIS — N2581 Secondary hyperparathyroidism of renal origin: Secondary | ICD-10-CM | POA: Diagnosis present

## 2016-12-30 DIAGNOSIS — Z9689 Presence of other specified functional implants: Secondary | ICD-10-CM

## 2016-12-30 DIAGNOSIS — J189 Pneumonia, unspecified organism: Secondary | ICD-10-CM

## 2016-12-30 DIAGNOSIS — Z66 Do not resuscitate: Secondary | ICD-10-CM | POA: Diagnosis present

## 2016-12-30 DIAGNOSIS — J9601 Acute respiratory failure with hypoxia: Secondary | ICD-10-CM | POA: Diagnosis not present

## 2016-12-30 DIAGNOSIS — Z96651 Presence of right artificial knee joint: Secondary | ICD-10-CM | POA: Diagnosis present

## 2016-12-30 DIAGNOSIS — I132 Hypertensive heart and chronic kidney disease with heart failure and with stage 5 chronic kidney disease, or end stage renal disease: Secondary | ICD-10-CM | POA: Diagnosis present

## 2016-12-30 DIAGNOSIS — Z794 Long term (current) use of insulin: Secondary | ICD-10-CM

## 2016-12-30 DIAGNOSIS — D631 Anemia in chronic kidney disease: Secondary | ICD-10-CM | POA: Diagnosis present

## 2016-12-30 DIAGNOSIS — G4730 Sleep apnea, unspecified: Secondary | ICD-10-CM | POA: Diagnosis present

## 2016-12-30 LAB — BASIC METABOLIC PANEL
Anion gap: 12 (ref 5–15)
BUN: 36 mg/dL — ABNORMAL HIGH (ref 6–20)
CHLORIDE: 95 mmol/L — AB (ref 101–111)
CO2: 29 mmol/L (ref 22–32)
CREATININE: 3.62 mg/dL — AB (ref 0.61–1.24)
Calcium: 8.6 mg/dL — ABNORMAL LOW (ref 8.9–10.3)
GFR calc Af Amer: 17 mL/min — ABNORMAL LOW (ref >60)
GFR calc non Af Amer: 15 mL/min — ABNORMAL LOW (ref >60)
Glucose, Bld: 221 mg/dL — ABNORMAL HIGH (ref 65–99)
Potassium: 3.4 mmol/L — ABNORMAL LOW (ref 3.5–5.1)
SODIUM: 136 mmol/L (ref 135–145)

## 2016-12-30 LAB — CBC
HEMATOCRIT: 30.5 % — AB (ref 40.0–52.0)
HEMOGLOBIN: 10.6 g/dL — AB (ref 13.0–18.0)
MCH: 29.6 pg (ref 26.0–34.0)
MCHC: 34.7 g/dL (ref 32.0–36.0)
MCV: 85.2 fL (ref 80.0–100.0)
PLATELETS: 167 10*3/uL (ref 150–440)
RBC: 3.58 MIL/uL — AB (ref 4.40–5.90)
RDW: 15.6 % — ABNORMAL HIGH (ref 11.5–14.5)
WBC: 10.3 10*3/uL (ref 3.8–10.6)

## 2016-12-30 LAB — TROPONIN I
TROPONIN I: 0.11 ng/mL — AB (ref <0.03)
Troponin I: <0.03 ng/mL (ref <0.03)

## 2016-12-30 LAB — LIPID PANEL
CHOL/HDL RATIO: 5.5 ratio
CHOLESTEROL: 160 mg/dL (ref 0–200)
HDL: 29 mg/dL — ABNORMAL LOW (ref >40)
LDL Cholesterol: 89 mg/dL (ref 0–99)
Triglycerides: 211 mg/dL — ABNORMAL HIGH (ref <150)
VLDL: 42 mg/dL — ABNORMAL HIGH (ref 0–40)

## 2016-12-30 LAB — GLUCOSE, CAPILLARY: GLUCOSE-CAPILLARY: 253 mg/dL — AB (ref 65–99)

## 2016-12-30 LAB — LACTIC ACID, PLASMA
LACTIC ACID, VENOUS: 1.1 mmol/L (ref 0.5–1.9)
Lactic Acid, Venous: 1.2 mmol/L (ref 0.5–1.9)

## 2016-12-30 MED ORDER — DILTIAZEM HCL 100 MG IV SOLR
5.0000 mg/h | Freq: Once | INTRAVENOUS | Status: AC
  Administered 2016-12-30: 5 mg/h via INTRAVENOUS
  Filled 2016-12-30: qty 100

## 2016-12-30 MED ORDER — FENTANYL CITRATE (PF) 100 MCG/2ML IJ SOLN
25.0000 ug | INTRAMUSCULAR | Status: DC | PRN
  Administered 2016-12-30 – 2016-12-31 (×2): 25 ug via INTRAVENOUS
  Filled 2016-12-30 (×2): qty 2

## 2016-12-30 MED ORDER — SENNOSIDES-DOCUSATE SODIUM 8.6-50 MG PO TABS: 1.0000 | ORAL_TABLET | Freq: Every evening | ORAL | Status: DC | PRN

## 2016-12-30 MED ORDER — CALCIUM ACETATE 667 MG PO CAPS
667.0000 mg | ORAL_CAPSULE | Freq: Three times a day (TID) | ORAL | Status: DC
  Administered 2016-12-31 – 2017-01-16 (×38): 667 mg via ORAL
  Filled 2016-12-30 (×83): qty 1

## 2016-12-30 MED ORDER — TRAMADOL HCL 50 MG PO TABS: 50.0000 mg | ORAL_TABLET | Freq: Two times a day (BID) | ORAL | Status: DC | PRN

## 2016-12-30 MED ORDER — AMIODARONE IV BOLUS ONLY 150 MG/100ML
INTRAVENOUS | Status: AC
  Filled 2016-12-30: qty 100

## 2016-12-30 MED ORDER — ISOSORBIDE MONONITRATE ER 30 MG PO TB24
60.0000 mg | ORAL_TABLET | Freq: Every day | ORAL | Status: DC
  Administered 2016-12-31 – 2017-01-11 (×11): 60 mg via ORAL
  Filled 2016-12-30 (×6): qty 1
  Filled 2016-12-30 (×3): qty 2
  Filled 2016-12-30: qty 1
  Filled 2016-12-30 (×2): qty 2

## 2016-12-30 MED ORDER — SODIUM CHLORIDE 0.9 % IV SOLN
250.0000 mL | INTRAVENOUS | Status: DC | PRN
  Administered 2017-01-06: 250 mL via INTRAVENOUS

## 2016-12-30 MED ORDER — ACETAMINOPHEN 325 MG PO TABS
650.0000 mg | ORAL_TABLET | Freq: Four times a day (QID) | ORAL | Status: DC | PRN
  Administered 2017-01-12: 650 mg via ORAL
  Filled 2016-12-30: qty 2

## 2016-12-30 MED ORDER — LISINOPRIL 20 MG PO TABS
20.0000 mg | ORAL_TABLET | Freq: Every evening | ORAL | Status: DC
  Administered 2016-12-31 – 2017-01-10 (×10): 20 mg via ORAL
  Filled 2016-12-30 (×10): qty 1

## 2016-12-30 MED ORDER — AMIODARONE HCL IN DEXTROSE 360-4.14 MG/200ML-% IV SOLN
30.0000 mg/h | INTRAVENOUS | Status: DC
  Administered 2016-12-31 – 2017-01-01 (×3): 30 mg/h via INTRAVENOUS
  Filled 2016-12-30 (×3): qty 200

## 2016-12-30 MED ORDER — SPIRONOLACTONE 25 MG PO TABS
25.0000 mg | ORAL_TABLET | Freq: Two times a day (BID) | ORAL | Status: DC
  Administered 2016-12-31 – 2017-01-16 (×25): 25 mg via ORAL
  Filled 2016-12-30 (×25): qty 1

## 2016-12-30 MED ORDER — HYDROCODONE-ACETAMINOPHEN 5-325 MG PO TABS
1.0000 | ORAL_TABLET | ORAL | Status: DC | PRN
  Administered 2016-12-30: 1 via ORAL
  Administered 2017-01-08 – 2017-01-09 (×3): 2 via ORAL
  Administered 2017-01-10 – 2017-01-11 (×2): 1 via ORAL
  Filled 2016-12-30 (×2): qty 1
  Filled 2016-12-30: qty 2
  Filled 2016-12-30: qty 1
  Filled 2016-12-30 (×2): qty 2

## 2016-12-30 MED ORDER — AMIODARONE HCL IN DEXTROSE 360-4.14 MG/200ML-% IV SOLN
INTRAVENOUS | Status: AC
  Filled 2016-12-30: qty 200

## 2016-12-30 MED ORDER — ENOXAPARIN SODIUM 30 MG/0.3ML ~~LOC~~ SOLN
30.0000 mg | SUBCUTANEOUS | Status: DC
  Administered 2016-12-30: 30 mg via SUBCUTANEOUS
  Filled 2016-12-30: qty 0.3

## 2016-12-30 MED ORDER — AMIODARONE LOAD VIA INFUSION
150.0000 mg | Freq: Once | INTRAVENOUS | Status: AC
  Administered 2016-12-30: 150 mg via INTRAVENOUS
  Filled 2016-12-30: qty 83.34

## 2016-12-30 MED ORDER — ACETAMINOPHEN 650 MG RE SUPP: 650.0000 mg | Freq: Four times a day (QID) | RECTAL | Status: DC | PRN

## 2016-12-30 MED ORDER — SODIUM CHLORIDE 0.9% FLUSH: 3.0000 mL | INTRAVENOUS | Status: DC | PRN

## 2016-12-30 MED ORDER — AMIODARONE HCL 200 MG PO TABS
100.0000 mg | ORAL_TABLET | Freq: Every evening | ORAL | Status: DC
  Administered 2016-12-30 – 2016-12-31 (×2): 100 mg via ORAL
  Filled 2016-12-30 (×2): qty 1

## 2016-12-30 MED ORDER — ACETAMINOPHEN 325 MG PO TABS: 650.0000 mg | ORAL_TABLET | Freq: Four times a day (QID) | ORAL | Status: DC | PRN

## 2016-12-30 MED ORDER — ASPIRIN EC 81 MG PO TBEC
81.0000 mg | DELAYED_RELEASE_TABLET | Freq: Every day | ORAL | Status: DC
  Administered 2016-12-31 – 2017-01-16 (×14): 81 mg via ORAL
  Filled 2016-12-30 (×17): qty 1

## 2016-12-30 MED ORDER — ASPIRIN EC 81 MG PO TBEC: 81.0000 mg | DELAYED_RELEASE_TABLET | Freq: Every day | ORAL | Status: DC

## 2016-12-30 MED ORDER — SODIUM CHLORIDE 0.9 % IV BOLUS (SEPSIS)
250.0000 mL | Freq: Once | INTRAVENOUS | Status: AC
  Administered 2016-12-30: 250 mL via INTRAVENOUS

## 2016-12-30 MED ORDER — SODIUM CHLORIDE 0.9% FLUSH
3.0000 mL | Freq: Two times a day (BID) | INTRAVENOUS | Status: DC
  Administered 2016-12-30 – 2017-01-16 (×30): 3 mL via INTRAVENOUS

## 2016-12-30 MED ORDER — DOXAZOSIN MESYLATE 4 MG PO TABS
8.0000 mg | ORAL_TABLET | Freq: Every evening | ORAL | Status: DC
  Administered 2016-12-30 – 2017-01-15 (×15): 8 mg via ORAL
  Filled 2016-12-30 (×2): qty 2
  Filled 2016-12-30: qty 1
  Filled 2016-12-30 (×2): qty 4
  Filled 2016-12-30 (×4): qty 2
  Filled 2016-12-30 (×2): qty 4
  Filled 2016-12-30 (×2): qty 2
  Filled 2016-12-30: qty 4
  Filled 2016-12-30 (×3): qty 2
  Filled 2016-12-30 (×2): qty 4

## 2016-12-30 MED ORDER — CARVEDILOL 6.25 MG PO TABS
6.2500 mg | ORAL_TABLET | Freq: Two times a day (BID) | ORAL | Status: DC
  Administered 2016-12-30 – 2017-01-14 (×25): 6.25 mg via ORAL
  Filled 2016-12-30 (×31): qty 1

## 2016-12-30 MED ORDER — ATORVASTATIN CALCIUM 20 MG PO TABS
20.0000 mg | ORAL_TABLET | Freq: Every day | ORAL | Status: DC
  Administered 2016-12-30 – 2017-01-15 (×16): 20 mg via ORAL
  Filled 2016-12-30 (×9): qty 1
  Filled 2016-12-30: qty 2
  Filled 2016-12-30 (×6): qty 1

## 2016-12-30 MED ORDER — MAGNESIUM SULFATE 2 GM/50ML IV SOLN
2.0000 g | Freq: Once | INTRAVENOUS | Status: AC
  Administered 2016-12-30: 2 g via INTRAVENOUS
  Filled 2016-12-30: qty 50

## 2016-12-30 MED ORDER — PANTOPRAZOLE SODIUM 40 MG PO TBEC
40.0000 mg | DELAYED_RELEASE_TABLET | Freq: Every day | ORAL | Status: DC
  Administered 2016-12-31 – 2017-01-16 (×16): 40 mg via ORAL
  Filled 2016-12-30 (×17): qty 1

## 2016-12-30 MED ORDER — FUROSEMIDE 40 MG PO TABS
40.0000 mg | ORAL_TABLET | Freq: Two times a day (BID) | ORAL | Status: DC
  Administered 2016-12-30 – 2017-01-16 (×27): 40 mg via ORAL
  Filled 2016-12-30: qty 1
  Filled 2016-12-30: qty 2
  Filled 2016-12-30 (×10): qty 1
  Filled 2016-12-30 (×2): qty 2
  Filled 2016-12-30 (×2): qty 1
  Filled 2016-12-30: qty 2
  Filled 2016-12-30 (×11): qty 1

## 2016-12-30 MED ORDER — AMIODARONE HCL IN DEXTROSE 360-4.14 MG/200ML-% IV SOLN
60.0000 mg/h | INTRAVENOUS | Status: AC
  Administered 2016-12-30: 60 mg/h via INTRAVENOUS
  Filled 2016-12-30: qty 200

## 2016-12-30 MED ORDER — INSULIN ASPART 100 UNIT/ML ~~LOC~~ SOLN
0.0000 [IU] | Freq: Three times a day (TID) | SUBCUTANEOUS | Status: DC
  Administered 2016-12-31 (×2): 7 [IU] via SUBCUTANEOUS
  Administered 2016-12-31: 5 [IU] via SUBCUTANEOUS
  Administered 2017-01-01: 2 [IU] via SUBCUTANEOUS
  Administered 2017-01-01: 5 [IU] via SUBCUTANEOUS
  Administered 2017-01-01: 3 [IU] via SUBCUTANEOUS
  Administered 2017-01-02: 5 [IU] via SUBCUTANEOUS
  Administered 2017-01-02: 7 [IU] via SUBCUTANEOUS
  Administered 2017-01-03: 5 [IU] via SUBCUTANEOUS
  Administered 2017-01-03: 3 [IU] via SUBCUTANEOUS
  Administered 2017-01-03 – 2017-01-04 (×2): 2 [IU] via SUBCUTANEOUS
  Filled 2016-12-30 (×16): qty 1

## 2016-12-30 NOTE — Telephone Encounter (Signed)
Pleurx Supply Sheet has been faxed to [facility] with positive confirmation at this time.

## 2016-12-30 NOTE — ED Notes (Signed)
Daughter, [name]  [phone]

## 2016-12-30 NOTE — ED Provider Notes (Signed)
[facility] Emergency Department Provider Note    First MD Initiated Contact with Patient 12/30/16 1621     (approximate)  I have reviewed the triage vital signs and the nursing notes.   HISTORY  Chief Complaint Chest Pain    HPI Ryland Tungate is a 76 y.o. male extensive and complex past medical history including COPD as well as CHF on 4-6 L nasal cannula at home as well as end-stage renal disease on dialysis presents from dialysis today with little bit of chest pain during the last hour of his dialysis session. States the chest pain is midsternal nonradiating. Does have some shortness of breath and feels palpitations. Denies any recent fevers. No numbness or tingling. No recent antibiotics.   Past Medical History:  Diagnosis Date  . Cancer [facility])    Prostate  . CHF (congestive heart failure) ([place])   . Chronic kidney disease   . Complication of anesthesia    hard to wake up  . COPD (chronic obstructive pulmonary disease) ([place])   . Diabetes mellitus without complication ([place])   . Difficult intubation   . Dyspnea   . GERD (gastroesophageal reflux disease)   . Hyperlipidemia   . Hypertension   . Pleural effusion   . Renal insufficiency    Family History  Problem Relation Age of Onset  . Colon cancer Father   . Brain cancer Mother   . CAD Neg Hx   . Diabetes Neg Hx    Past Surgical History:  Procedure Laterality Date  . APPENDECTOMY    . AV FISTULA PLACEMENT Left 05/30/2016   Procedure: ARTERIOVENOUS (AV) FISTULA CREATION ( BRACHIOCEPHALIC );  Surgeon: [name], MD;  Location: [facility] ORS;  Service: Vascular;  Laterality: Left;  . CAPD INSERTION N/A 05/30/2016   Procedure: LAPAROSCOPIC INSERTION CONTINUOUS AMBULATORY PERITONEAL DIALYSIS  (CAPD) CATHETER;  Surgeon: [name], MD;  Location: [facility] ORS;  Service: Vascular;  Laterality: N/A;  . DIALYSIS/PERMA CATHETER INSERTION    . DIALYSIS/PERMA CATHETER REMOVAL N/A 08/21/2016   Procedure:  Dialysis/Perma Catheter Removal;  Surgeon: [name], MD;  Location: [place] CV LAB;  Service: Cardiovascular;  Laterality: N/A;  . JOINT REPLACEMENT     right knee   . LEFT HEART CATH AND CORONARY ANGIOGRAPHY N/A 08/21/2016   Procedure: Left Heart Cath and Coronary Angiography possible PCI;  Surgeon: [name], MD;  Location: [place] [facility] LAB;  Service: Cardiovascular;  Laterality: N/A;  . postate removal     . PROSTATE SURGERY    . PSEUDOANERYSM COMPRESSION Right 08/27/2016   Procedure: Pseudoanerysm Compression;  Surgeon: Katha Cabal, MD;  Location: [place] CV LAB;  Service: Cardiovascular;  Laterality: Right;  . REMOVAL OF A DIALYSIS CATHETER N/A 08/08/2016   Procedure: REMOVAL OF A DIALYSIS CATHETER;  Surgeon: [name], MD;  Location: [facility] ORS;  Service: Vascular;  Laterality: N/A;   Patient Active Problem List   Diagnosis Date Noted  . Atrial fibrillation with RVR ([place]) 11/25/2016  . A-fib ([place]) 10/30/2016  . Allergic rhinitis due to pollen 10/29/2016  . Chronic airway obstruction ([place]) 10/29/2016  . Chronic fatigue syndrome 10/29/2016  . Dependence on renal dialysis ([place]) 10/29/2016  . Diabetic neuropathy ([place]) 10/29/2016  . Diabetic renal disease ([place]) 10/29/2016  . Heart failure ([place]) 10/29/2016  . Hyperglycemia due to type 2 diabetes mellitus ([place]) 10/29/2016  . Incontinence without sensory awareness 10/29/2016  . Long term current use of insulin ([place]) 10/29/2016  .  Obstructive sleep apnea 10/29/2016  . Urinary incontinence 10/29/2016  . Acute on chronic respiratory failure ([place]) 10/16/2016  . Anxiety 10/10/2016  . Acute encephalopathy   . HCAP (healthcare-associated pneumonia) 09/14/2016  . Tobacco use 09/03/2016  . Aneurysm ([place]) 08/28/2016  . Pseudoaneurysm following procedure ([place]) 08/26/2016  . Chest tightness 08/20/2016  . GERD without esophagitis 08/20/2016  . NSTEMI (non-ST elevated myocardial infarction) ([place]) 08/20/2016  .  Cancer of prostate ([place]) 05/16/2016  . Chronic diastolic CHF (congestive heart failure) ([place]) 05/16/2016  . Diabetic polyneuropathy associated with type 2 diabetes mellitus ([place]) 05/16/2016  . Onychomycosis of toenail 05/13/2016  . Essential hypertension, benign 04/23/2016  . Hyperlipidemia 04/23/2016  . Type 2 diabetes mellitus with chronic kidney disease on chronic dialysis, with long-term current use of insulin ([place]) 04/23/2016  . ESRD (end stage renal disease) ([place]) 04/23/2016      Prior to Admission medications   Medication Sig Start Date End Date Taking? Authorizing Provider  acetaminophen (TYLENOL) 325 MG tablet Take 2 tablets (650 mg total) by mouth every 6 (six) hours as needed for mild pain (or Fever >/= 101). 09/23/16   Nicholes Mango, MD  amiodarone (PACERONE) 200 MG tablet Take 100 mg by mouth every evening.  12/10/16   [provider]  aspirin EC 81 MG tablet Take 81 mg by mouth daily.    [provider]  atorvastatin (LIPITOR) 20 MG tablet Take 20 mg by mouth at bedtime.     [provider]  calcium acetate (PHOSLO) 667 MG capsule Take 667 mg by mouth 3 (three) times daily with meals.     [provider]  carvedilol (COREG) 6.25 MG tablet Take 6.25 mg by mouth 2 (two) times daily. 8 am, 5 pm 09/05/16   [provider]  doxazosin (CARDURA) 8 MG tablet Take 8 mg by mouth every evening.     [provider]  furosemide (LASIX) 40 MG tablet Take 40 mg by mouth 2 (two) times daily.     [provider]  GUAIFENESIN 1200 PO Take 1,200 mg by mouth at bedtime.    [provider]  insulin lispro (HUMALOG) 100 UNIT/ML injection Inject 10 Units into the skin 3 (three) times daily before meals.    [provider]  ipratropium-albuterol (DUONEB) 0.5-2.5 (3) MG/3ML SOLN Take 3 mLs by nebulization every 6 (six) hours as needed. Patient not taking: Reported on 12/27/2016 09/23/16   Nicholes Mango, MD  isosorbide mononitrate  (IMDUR) 60 MG 24 hr tablet Take 60 mg by mouth daily.  11/28/16   [provider]  Lidocaine-Prilocaine, Bulk, 2.5-2.5 % CREA Apply 1 application topically as needed. Apply to fistula site prior to dialysis treatments as needed    [provider]  lisinopril (PRINIVIL,ZESTRIL) 20 MG tablet Take 20 mg by mouth every evening.     [provider]  omeprazole (PRILOSEC) 20 MG capsule Take 20 mg by mouth 2 (two) times daily before a meal.     [provider]  OXYGEN Inhale 2-5 L into the lungs continuous.     [provider]  spironolactone (ALDACTONE) 25 MG tablet Take 25 mg by mouth 2 (two) times daily.     [provider]  traMADol (ULTRAM) 50 MG tablet Take 1 tablet (50 mg total) by mouth every 12 (twelve) hours as needed for moderate pain or severe pain. 11/27/16 11/27/17  Gladstone Lighter, MD    Allergies Tetracyclines & related    Social History Social  History  Substance Use Topics  . Smoking status: Former Smoker    Types: Cigarettes    Quit date: 05/21/1982  . Smokeless tobacco: Never Used  . Alcohol use 1.8 oz/week    3 Glasses of wine per week    Review of Systems Patient denies headaches, rhinorrhea, blurry vision, numbness, shortness of breath, chest pain, edema, cough, abdominal pain, nausea, vomiting, diarrhea, dysuria, fevers, rashes or hallucinations unless otherwise stated above in HPI. ____________________________________________   PHYSICAL EXAM:  VITAL SIGNS: Vitals:   12/30/16 1621  BP: 103/71  Pulse: (!) 118  Resp: 16  Temp: 98.1 F (36.7 C)  SpO2: 94%    Constitutional: Alert and oriented. Chronically ill appearing, frail and fatigued Eyes: Conjunctivae are normal.  Head: Atraumatic. Nose: No congestion/rhinnorhea. Mouth/Throat: Mucous membranes are moist.   Neck: No stridor. Painless ROM.  Cardiovascular: tachycardic, irregularly irregular rhythm. Grossly normal heart sounds.  Good peripheral  circulation. Respiratory: mild tachypnea on [place],  Coarse anterior lung sounds, no rhonchi, diminished breathsounds in right base Gastrointestinal: Soft and nontender. No distention. No abdominal bruits. No CVA tenderness. Genitourinary:  Musculoskeletal: No lower extremity tenderness 1+ edema.  No joint effusions. Neurologic:  Normal speech and language. No gross focal neurologic deficits are appreciated. No facial droop Skin:  Skin is warm, dry and intact. No rash noted. Psychiatric: appropriate  ____________________________________________   LABS (all labs ordered are listed, but only abnormal results are displayed)  Results for orders placed or performed during the hospital encounter of 12/30/16 (from the past 24 hour(s))  CBC     Status: Abnormal   Collection Time: 12/30/16  4:24 PM  Result Value Ref Range   WBC 10.3 3.8 - 10.6 K/uL   RBC 3.58 (L) 4.40 - 5.90 MIL/uL   Hemoglobin 10.6 (L) 13.0 - 18.0 g/dL   HCT 30.5 (L) 40.0 - 52.0 %   MCV 85.2 80.0 - 100.0 fL   MCH 29.6 26.0 - 34.0 pg   MCHC 34.7 32.0 - 36.0 g/dL   RDW 15.6 (H) 11.5 - 14.5 %   Platelets 167 150 - 440 K/uL   ____________________________________________  EKG My review and personal interpretation at Time: 16:20   Indication: chest pain  Rate: 125  Rhythm: afib with rvr Axis: right Other: afib with rvr with lateral st depressions likely rate depending, no stemi ____________________________________________  RADIOLOGY  I personally reviewed all radiographic images ordered to evaluate for the above acute complaints and reviewed radiology reports and findings.  These findings were personally discussed with the patient.  Please see medical record for radiology report.  ____________________________________________   PROCEDURES  Procedure(s) performed:  Procedures    Critical Care performed: yes CRITICAL CARE Performed by: Merlyn Lot   Total critical care time: 40 minutes  Critical care time was  exclusive of separately billable procedures and treating other patients.  Critical care was necessary to treat or prevent imminent or life-threatening deterioration.  Critical care was time spent personally by me on the following activities: development of treatment plan with patient and/or surrogate as well as nursing, discussions with consultants, evaluation of patient's response to treatment, examination of patient, obtaining history from patient or surrogate, ordering and performing treatments and interventions, ordering and review of laboratory studies, ordering and review of radiographic studies, pulse oximetry and re-evaluation of patient's condition.  ____________________________________________   INITIAL IMPRESSION / ASSESSMENT AND PLAN / ED COURSE  Pertinent labs & imaging results that were available during my care of the patient  were reviewed by me and considered in my medical decision making (see chart for details).  DDX: Asthma, copd, CHF, pna, ptx, malignancy, Pe, anemia  Rafferty Postlewait is a 76 y.o. who presents to the ED with chest pain and A. fib with RVR.  EKG shows some lateral EKG changes concerning for ischemia most consistent with a rate dependent changes. She is less consistent with pneumonia. I have seen this patient multiple times over the past year and unfortunately he does appear to have had a significant deterioration over the past several months. Blood work will be sent for the above differential. We will start the patient on IV Cardizem for rate control provide gentle IV fluids.  Clinical Course as of Dec 30 2021  Mon Dec 30, 2016  5300 cor temperature is afebrile. I  do feel discomfort secondary to a symptomatically A. fib with RVR. I spoke with Dr. Nehemiah Massed of cardiology regarding the patient's presentation and he has recommended reloading him with IV amiodarone.  discussed case with Dr. Genia Harold for admission.  [PR]    Clinical Course User Index [PR] Merlyn Lot, MD     ____________________________________________   FINAL CLINICAL IMPRESSION(S) / ED DIAGNOSES  Final diagnoses:  Atrial fibrillation with RVR ([place])  Chest pain, unspecified type  ESRD on dialysis [facility])      NEW MEDICATIONS STARTED DURING THIS VISIT:  New Prescriptions   No medications on file     Note:  This document was prepared using Dragon voice recognition software and may include unintentional dictation errors.    Merlyn Lot, MD 12/30/16 2024

## 2016-12-30 NOTE — Telephone Encounter (Signed)
Pt's daughter advised of pre op date/time and sx date. Sx: 01/06/17 with Dr Oaks-Thoracoscopy with Talc Pleurodisis With pleurx catheter placement.  Pre op: 12/31/16 @ 11:30am--Office.

## 2016-12-30 NOTE — H&P (Signed)
[facility] NAME: Calvin Good    MR#:  [id number]  DATE OF BIRTH:  05-24-1940  DATE OF ADMISSION:  12/30/2016  PRIMARY CARE PHYSICIAN: [name], MD   REQUESTING/REFERRING PHYSICIAN: dr Sharlett Iles  CHIEF COMPLAINT:   Chest pain HISTORY OF PRESENT ILLNESS:  Calvin Good  is a 76 y.o. 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 Patient reports he had 10 out of 10 substernal chest pain without radiation. This resolved after 20 minutes. No relieving or aggravating factors. Patient is complaining chest pain currently. His heart rate was noted to be in the 130s to 170s and in atrial fibrillation. He does complain of shortness of breath and palpitations. He was started on diltiazem drip in the emergency room however this had no effect on his heart rate. Cardiology was consulted and recommended amiodarone drip which is controlling his heart rates in the low 100s.   PAST MEDICAL HISTORY:   Past Medical History:  Diagnosis Date  . Cancer [facility])    Prostate  . CHF (congestive heart failure) ([place])   . Chronic kidney disease   . Complication of anesthesia    hard to wake up  . COPD (chronic obstructive pulmonary disease) ([place])   . Diabetes mellitus without complication ([place])   . Difficult intubation   . Dyspnea   . GERD (gastroesophageal reflux disease)   . Hyperlipidemia   . Hypertension   . Pleural effusion   . Renal insufficiency     PAST SURGICAL HISTORY:   Past Surgical History:  Procedure Laterality Date  . APPENDECTOMY    . AV FISTULA PLACEMENT Left 05/30/2016   Procedure: ARTERIOVENOUS (AV) FISTULA CREATION ( BRACHIOCEPHALIC );  Surgeon: [name], MD;  Location: [facility] ORS;  Service:  Vascular;  Laterality: Left;  . CAPD INSERTION N/A 05/30/2016   Procedure: LAPAROSCOPIC INSERTION CONTINUOUS AMBULATORY PERITONEAL DIALYSIS  (CAPD) CATHETER;  Surgeon: [name], MD;  Location: [facility] ORS;  Service: Vascular;  Laterality: N/A;  . DIALYSIS/PERMA CATHETER INSERTION    . DIALYSIS/PERMA CATHETER REMOVAL N/A 08/21/2016   Procedure: Dialysis/Perma Catheter Removal;  Surgeon: [name], MD;  Location: [place] [facility];  Service: Cardiovascular;  Laterality: N/A;  . JOINT REPLACEMENT     right knee   . LEFT HEART CATH AND CORONARY ANGIOGRAPHY N/A 08/21/2016   Procedure: Left Heart Cath and Coronary Angiography possible PCI;  Surgeon: [name], MD;  Location: [place] [facility];  Service: Cardiovascular;  Laterality: N/A;  . postate removal     . PROSTATE SURGERY    . PSEUDOANERYSM COMPRESSION Right 08/27/2016   Procedure: Pseudoanerysm Compression;  Surgeon: Katha Cabal, MD;  Location: [place] [facility];  Service: Cardiovascular;  Laterality: Right;  . REMOVAL OF A DIALYSIS CATHETER N/A 08/08/2016   Procedure: REMOVAL OF A DIALYSIS CATHETER;  Surgeon: [name], MD;  Location: [facility] ORS;  Service: Vascular;  Laterality: N/A;    SOCIAL HISTORY:   Social History  Substance Use Topics  . Smoking status: Former Smoker    Types: Cigarettes    Quit date: 05/21/1982  . Smokeless tobacco: Never Used  . Alcohol use 1.8 oz/week    3 Glasses of wine per week    FAMILY  HISTORY:   Family History  Problem Relation Age of Onset  . Colon cancer Father   . Brain cancer Mother   . CAD Neg Hx   . Diabetes Neg Hx     DRUG ALLERGIES:   Allergies  Allergen Reactions  . Tetracyclines & Related Other (See Comments)    Reaction: Unknown     REVIEW OF SYSTEMS:   Review of Systems  Constitutional: Negative.  Negative for chills, fever and malaise/fatigue.  HENT: Negative.  Negative for ear discharge, ear pain, hearing loss, nosebleeds and sore throat.    Eyes: Negative.  Negative for blurred vision and pain.  Respiratory: Positive for shortness of breath. Negative for cough, hemoptysis and wheezing.   Cardiovascular: Positive for chest pain and palpitations. Negative for leg swelling.  Gastrointestinal: Negative.  Negative for abdominal pain, blood in stool, diarrhea, nausea and vomiting.  Genitourinary: Negative.  Negative for dysuria.  Musculoskeletal: Negative.  Negative for back pain.  Skin: Negative.   Neurological: Negative for dizziness, tremors, speech change, focal weakness, seizures and headaches.  Endo/Heme/Allergies: Negative.  Does not bruise/bleed easily.  Psychiatric/Behavioral: Negative.  Negative for depression, hallucinations and suicidal ideas.    MEDICATIONS AT HOME:   Prior to Admission medications   Medication Sig Start Date End Date Taking? Authorizing Provider  acetaminophen (TYLENOL) 325 MG tablet Take 2 tablets (650 mg total) by mouth every 6 (six) hours as needed for mild pain (or Fever >/= 101). 09/23/16   Nicholes Mango, MD  amiodarone (PACERONE) 200 MG tablet Take 100 mg by mouth every evening.  12/10/16   [provider]  aspirin EC 81 MG tablet Take 81 mg by mouth daily.    [provider]  atorvastatin (LIPITOR) 20 MG tablet Take 20 mg by mouth at bedtime.     [provider]  calcium acetate (PHOSLO) 667 MG capsule Take 667 mg by mouth 3 (three) times daily with meals.     [provider]  carvedilol (COREG) 6.25 MG tablet Take 6.25 mg by mouth 2 (two) times daily. 8 am, 5 pm 09/05/16   [provider]  doxazosin (CARDURA) 8 MG tablet Take 8 mg by mouth every evening.     [provider]  furosemide (LASIX) 40 MG tablet Take 40 mg by mouth 2 (two) times daily.     [provider]  GUAIFENESIN 1200 PO Take 1,200 mg by mouth at bedtime.    [provider]  insulin lispro (HUMALOG) 100 UNIT/ML injection Inject 10 Units into the skin 3 (three)  times daily before meals.    [provider]  ipratropium-albuterol (DUONEB) 0.5-2.5 (3) MG/3ML SOLN Take 3 mLs by nebulization every 6 (six) hours as needed. Patient not taking: Reported on 12/27/2016 09/23/16   Nicholes Mango, MD  isosorbide mononitrate (IMDUR) 60 MG 24 hr tablet Take 60 mg by mouth daily.  11/28/16   [provider]  Lidocaine-Prilocaine, Bulk, 2.5-2.5 % CREA Apply 1 application topically as needed. Apply to fistula site prior to dialysis treatments as needed    [provider]  lisinopril (PRINIVIL,ZESTRIL) 20 MG tablet Take 20 mg by mouth every evening.     [provider]  omeprazole (PRILOSEC) 20 MG capsule Take 20 mg by mouth 2 (two) times daily before a meal.     [provider]  OXYGEN Inhale 2-5 L into the lungs continuous.     [provider]  spironolactone (ALDACTONE) 25 MG tablet Take 25 mg  by mouth 2 (two) times daily.     [provider]  traMADol (ULTRAM) 50 MG tablet Take 1 tablet (50 mg total) by mouth every 12 (twelve) hours as needed for moderate pain or severe pain. 11/27/16 11/27/17  Gladstone Lighter, MD      VITAL SIGNS:  Blood pressure (!) 135/51, pulse (!) 117, temperature 98.3 F (36.8 C), temperature source Rectal, resp. rate 19, height 5\' 8"  (1.727 m), weight 97.5 kg (215 lb), SpO2 97 %.  PHYSICAL EXAMINATION:   Physical Exam  Constitutional: He is oriented to person, place, and time and well-developed, well-nourished, and in no distress. No distress.  HENT:  Head: Normocephalic.  Eyes: No scleral icterus.  Neck: Normal range of motion. Neck supple. No JVD present. No tracheal deviation present.  Cardiovascular: Normal heart sounds.  Exam reveals no gallop and no friction rub.   No murmur heard. Tachycardia irregular irregular  Pulmonary/Chest: Effort normal. No respiratory distress. He has no wheezes. He has no rales. He exhibits no tenderness.  Abdominal: Soft. Bowel sounds are  normal. He exhibits no distension and no mass. There is no tenderness. There is no rebound and no guarding.  Musculoskeletal: Normal range of motion. He exhibits edema.  Neurological: He is alert and oriented to person, place, and time.  Skin: Skin is warm. No rash noted. No erythema.  Psychiatric: Affect and judgment normal.      LABORATORY PANEL:   CBC  Recent Labs Lab 12/30/16 1624  WBC 10.3  HGB 10.6*  HCT 30.5*  PLT 167   ------------------------------------------------------------------------------------------------------------------  Chemistries   Recent Labs Lab 12/30/16 1624  NA 136  K 3.4*  CL 95*  CO2 29  GLUCOSE 221*  BUN 36*  CREATININE 3.62*  CALCIUM 8.6*   ------------------------------------------------------------------------------------------------------------------  Cardiac Enzymes  Recent Labs Lab 12/30/16 1624  TROPONINI <0.03   ------------------------------------------------------------------------------------------------------------------  RADIOLOGY:  Dg Chest Portable 1 View  Result Date: 12/30/2016 CLINICAL DATA:  Chest pain and shortness of breath. Uncontrolled atrial fibrillation. Recent dialysis. EXAM: PORTABLE CHEST 1 VIEW COMPARISON:  12/05/2016 FINDINGS: Stable mild cardiomegaly.  Aortic atherosclerosis. Increased size of small right pleural effusion. No evidence of pulmonary consolidation or frank pulmonary edema. IMPRESSION: Increased size of small right pleural effusion. Stable mild cardiomegaly. Electronically Signed   By: Earle Gell M.D.   On: 12/30/2016 16:53    EKG:  Atrial fibrillation heart rate 126 no ST elevation some mild ST depression in the anterolateral leads  IMPRESSION AND PLAN:   76 year old male with chronic atrial fibrillation, chronic systolic and diastolic heart failure with most recent EF showing 55% and end-stage renal disease on hemodialysis who presents with chest pain.  1. Atrial fibrillation  with RVR: Patient did not respond to diltiazem drip Continue amiodarone drip Restart outpatient medications including oral amiodarone and Coreg [facility] cardiology consultation requested Recent echocardiogram August 2018 so therefore will not repeat   2. Combined systolic and diastolic heart failure without signs of exacerbation: Continue Aldactone, lisinopril, Coreg, Lasix  3. Essential hypertension: Continue Coreg, Cardura, lisinopril, Aldactone  4. Chest pain with CAD and most recent cardiac catheterization May 2018 with diffuse RCA disease and collateral LAD, left circumflex 75% stenosis Cardiology consultation requested  Follow telemetry Continue to trend cardiac enzymes Continue aspirin and statin Check lipid panel  5. ESRD on hemodialysis: Nephrology consultation requested  6. COPD on chronic oxygen with chronic respiratory failure  7. Diabetes: Start sliding scale Diabetes coordinator consultation requested 8. Recurrent right pleural effusion: Patient  is planned for talc procedure next week  All the records are reviewed and case discussed with ED provider. Management plans discussed with the patient and he is in agreement  Case discussed with family at bedside.  CODE STATUS: FULL  TOTAL TIME TAKING CARE OF THIS PATIENT: 50 minutes.    Dhrithi Riche M.D on 12/30/2016 at 6:08 PM  Between 7am to 6pm - Pager - [phone]  After 6pm go to [email or web address] - password [email or web address] [facility]  Office  [phone]  CC: Primary care physician; [name], MD

## 2016-12-30 NOTE — Progress Notes (Signed)
  Amiodarone Drug - Drug Interaction Consult Note   Recommendations: Monitor   Amiodarone is metabolized by the cytochrome P450 system and therefore has the potential to cause many drug interactions. Amiodarone has an average plasma half-life of 50 days (range 20 to 100 days).   There is potential for drug interactions to occur several weeks or months after stopping treatment and the onset of drug interactions may be slow after initiating amiodarone.   [x]  Statins: Increased risk of myopathy. Simvastatin- restrict dose to 20mg  daily. Other statins: counsel patients to report any muscle pain or weakness immediately.  []  Anticoagulants: Amiodarone can increase anticoagulant effect. Consider warfarin dose reduction. Patients should be monitored closely and the dose of anticoagulant altered accordingly, remembering that amiodarone levels take several weeks to stabilize.  []  Antiepileptics: Amiodarone can increase plasma concentration of phenytoin, the dose should be reduced. Note that small changes in phenytoin dose can result in large changes in levels. Monitor patient and counsel on signs of toxicity.  []  Beta blockers: increased risk of bradycardia, AV block and myocardial depression. Sotalol - avoid concomitant use.  []   Calcium channel blockers (diltiazem and verapamil): increased risk of bradycardia, AV block and myocardial depression.  []   Cyclosporine: Amiodarone increases levels of cyclosporine. Reduced dose of cyclosporine is recommended.  []  Digoxin dose should be halved when amiodarone is started.  [x]  Diuretics: increased risk of cardiotoxicity if hypokalemia occurs.  []  Oral hypoglycemic agents (glyburide, glipizide, glimepiride): increased risk of hypoglycemia. Patient's glucose levels should be monitored closely when initiating amiodarone therapy.   []  Drugs that prolong the QT interval:  Torsades de pointes risk may be increased with concurrent use - avoid if possible.   Monitor QTc, also keep magnesium/potassium WNL if concurrent therapy can't be avoided. Marland Kitchen Antibiotics: e.g. fluoroquinolones, erythromycin. . Antiarrhythmics: e.g. quinidine, procainamide, disopyramide, sotalol. . Antipsychotics: e.g. phenothiazines, haloperidol.  . Lithium, tricyclic antidepressants, and methadone. Thank You,  Laural Benes  12/30/2016 5:57 PM

## 2016-12-30 NOTE — ED Triage Notes (Signed)
Pt to ED via EMS from dialysis, pt started complaining of CP during the end of his treatment, pt has uncontrolled a-fib HR 130-170s. Pt appears fatigue. A&OX4.

## 2016-12-30 NOTE — Progress Notes (Signed)
Anticoagulation monitoring(Lovenox):  76 yo male ordered Lovenox 40 mg Q24h  Filed Weights   12/30/16 1617 12/30/16 1946  Weight: 215 lb (97.5 kg) 207 lb 12.8 oz (94.3 kg)   BMI    Lab Results  Component Value Date   CREATININE 3.62 (H) 12/30/2016   CREATININE 2.80 (H) 12/04/2016   CREATININE 5.42 (H) 11/27/2016   Estimated Creatinine Clearance: 18.7 mL/min (A) (by C-G formula based on SCr of 3.62 mg/dL (H)). Hemoglobin & Hematocrit     Component Value Date/Time   HGB 10.6 (L) 12/30/2016 1624   HCT 30.5 (L) 12/30/2016 1624     Per Protocol for Patient with estCrcl < 30 ml/min and BMI < 40, will transition to Lovenox 30 mg Q24h.

## 2016-12-30 NOTE — ED Notes (Signed)
Hooked patient up to monitor. 

## 2016-12-31 ENCOUNTER — Inpatient Hospital Stay: Admission: RE | Admit: 2016-12-31 | Payer: Medicare Other | Source: Ambulatory Visit

## 2016-12-31 DIAGNOSIS — I4891 Unspecified atrial fibrillation: Secondary | ICD-10-CM

## 2016-12-31 LAB — MRSA PCR SCREENING: MRSA by PCR: NEGATIVE

## 2016-12-31 LAB — HEPARIN LEVEL (UNFRACTIONATED)
Heparin Unfractionated: 0.34 IU/mL (ref 0.30–0.70)
Heparin Unfractionated: 0.22 IU/mL — ABNORMAL LOW (ref 0.30–0.70)

## 2016-12-31 LAB — GLUCOSE, CAPILLARY
GLUCOSE-CAPILLARY: 315 mg/dL — AB (ref 65–99)
GLUCOSE-CAPILLARY: 321 mg/dL — AB (ref 65–99)
GLUCOSE-CAPILLARY: 260 mg/dL — AB (ref 65–99)
GLUCOSE-CAPILLARY: 309 mg/dL — AB (ref 65–99)

## 2016-12-31 LAB — PROTIME-INR
INR: 1.11
PROTHROMBIN TIME: 14.2 s (ref 11.4–15.2)

## 2016-12-31 LAB — BASIC METABOLIC PANEL
Anion gap: 11 (ref 5–15)
BUN: 44 mg/dL — AB (ref 6–20)
CHLORIDE: 94 mmol/L — AB (ref 101–111)
CO2: 28 mmol/L (ref 22–32)
CREATININE: 4.52 mg/dL — AB (ref 0.61–1.24)
Calcium: 7.9 mg/dL — ABNORMAL LOW (ref 8.9–10.3)
GFR calc Af Amer: 13 mL/min — ABNORMAL LOW (ref >60)
GFR calc non Af Amer: 11 mL/min — ABNORMAL LOW (ref >60)
GLUCOSE: 403 mg/dL — AB (ref 65–99)
POTASSIUM: 3.9 mmol/L (ref 3.5–5.1)
SODIUM: 133 mmol/L — AB (ref 135–145)

## 2016-12-31 LAB — CBC
HEMATOCRIT: 27.9 % — AB (ref 40.0–52.0)
Hemoglobin: 9.6 g/dL — ABNORMAL LOW (ref 13.0–18.0)
MCH: 29.9 pg (ref 26.0–34.0)
MCHC: 34.6 g/dL (ref 32.0–36.0)
MCV: 86.3 fL (ref 80.0–100.0)
PLATELETS: 143 10*3/uL — AB (ref 150–440)
RBC: 3.23 MIL/uL — ABNORMAL LOW (ref 4.40–5.90)
RDW: 15.2 % — AB (ref 11.5–14.5)
WBC: 9.8 10*3/uL (ref 3.8–10.6)

## 2016-12-31 LAB — APTT: aPTT: 39 seconds — ABNORMAL HIGH (ref 24–36)

## 2016-12-31 LAB — TROPONIN I
Troponin I: 0.53 ng/mL (ref <0.03)
Troponin I: 0.56 ng/mL (ref <0.03)

## 2016-12-31 MED ORDER — INSULIN DETEMIR 100 UNIT/ML ~~LOC~~ SOLN
15.0000 [IU] | Freq: Every day | SUBCUTANEOUS | Status: DC
  Administered 2016-12-31 – 2017-01-01 (×2): 15 [IU] via SUBCUTANEOUS
  Filled 2016-12-31 (×3): qty 0.15

## 2016-12-31 MED ORDER — GUAIFENESIN-DM 100-10 MG/5ML PO SYRP
5.0000 mL | ORAL_SOLUTION | ORAL | Status: DC | PRN
Start: 2016-12-31 — End: 2017-01-03
  Administered 2016-12-31 – 2017-01-02 (×6): 5 mL via ORAL
  Filled 2016-12-31 (×7): qty 5

## 2016-12-31 MED ORDER — HEPARIN (PORCINE) IN NACL 100-0.45 UNIT/ML-% IJ SOLN
1750.0000 [IU]/h | INTRAMUSCULAR | Status: DC
  Administered 2016-12-31 (×2): 1150 [IU]/h via INTRAVENOUS
  Administered 2017-01-01 – 2017-01-02 (×2): 1350 [IU]/h via INTRAVENOUS
  Administered 2017-01-03: 1550 [IU]/h via INTRAVENOUS
  Administered 2017-01-04 – 2017-01-05 (×3): 1750 [IU]/h via INTRAVENOUS
  Filled 2016-12-31 (×9): qty 250

## 2016-12-31 MED ORDER — HEPARIN BOLUS VIA INFUSION
4000.0000 [IU] | Freq: Once | INTRAVENOUS | Status: AC
  Administered 2016-12-31: 4000 [IU] via INTRAVENOUS
  Filled 2016-12-31: qty 4000

## 2016-12-31 NOTE — Progress Notes (Signed)
Called for elevated troponin No chest pain Continue aspirin, statin, betablocker Start patient on heparin drip Cardiology consult in am

## 2016-12-31 NOTE — Progress Notes (Signed)
Patient ID: Calvin Good, male   DOB: 1940-05-09, 77 y.o.   MRN: [id number]  [employer] PROGRESS NOTE  Calvin Good WUJ:[id number] DOB: 08-29-1940 DOA: 12/30/2016 PCP: [name], MD  HPI/Subjective: Patient states that he doesn't feel well. Complains of feeling weak and drained. Some shortness of breath.  Objective: Vitals:   12/31/16 0754 12/31/16 1118  BP: (!) 126/48 (!) 120/36  Pulse: 70 71  Resp: 20 19  Temp: 97.6 F (36.4 C) 98.1 F (36.7 C)  SpO2: 95% (!) 88%    Filed Weights   12/30/16 1617 12/30/16 1946 12/31/16 0337  Weight: 97.5 kg (215 lb) 94.3 kg (207 lb 12.8 oz) 93.9 kg (207 lb)    ROS: Review of Systems  Constitutional: Negative for chills and fever.  Eyes: Negative for blurred vision.  Respiratory: Positive for shortness of breath. Negative for cough.   Cardiovascular: Negative for chest pain.  Gastrointestinal: Positive for diarrhea, nausea and vomiting. Negative for abdominal pain and constipation.  Genitourinary: Negative for dysuria.  Musculoskeletal: Negative for joint pain.  Neurological: Positive for weakness. Negative for dizziness and headaches.   Exam: Physical Exam  Constitutional: He is oriented to person, place, and time.  HENT:  Nose: No mucosal edema.  Mouth/Throat: No oropharyngeal exudate or posterior oropharyngeal edema.  Eyes: Pupils are equal, round, and reactive to light. Conjunctivae, EOM and lids are normal.  Neck: No JVD present. Carotid bruit is not present. No edema present. No thyroid mass and no thyromegaly present.  Cardiovascular: S1 normal and S2 normal.  Exam reveals no gallop.   Murmur heard.  Systolic murmur is present with a grade of 2/6  Pulses:      Dorsalis pedis pulses are 2+ on the right side, and 2+ on the left side.  Respiratory: No respiratory distress. He has decreased breath sounds in the right lower field, the left middle field and the left lower field. He has no wheezes. He has rhonchi in the  right lower field, the left middle field and the left lower field. He has no rales.  GI: Soft. Bowel sounds are normal. There is no tenderness.  Musculoskeletal:       Right ankle: He exhibits swelling.       Left ankle: He exhibits swelling.  Lymphadenopathy:    He has no cervical adenopathy.  Neurological: He is alert and oriented to person, place, and time. No cranial nerve deficit.  Skin: Skin is warm. No rash noted. Nails show no clubbing.  Psychiatric: He has a normal mood and affect.      Data Reviewed: Basic Metabolic Panel:  Recent Labs Lab 12/30/16 1624 12/31/16 0151  NA 136 133*  K 3.4* 3.9  CL 95* 94*  CO2 29 28  GLUCOSE 221* 403*  BUN 36* 44*  CREATININE 3.62* 4.52*  CALCIUM 8.6* 7.9*   CBC:  Recent Labs Lab 12/30/16 1624 12/31/16 0151  WBC 10.3 9.8  HGB 10.6* 9.6*  HCT 30.5* 27.9*  MCV 85.2 86.3  PLT 167 143*   Cardiac Enzymes:  Recent Labs Lab 12/30/16 1624 12/30/16 2036 12/31/16 0151 12/31/16 0941  TROPONINI <0.03 0.11* 0.53* 0.56*   BNP (last 3 results)  Recent Labs  09/14/16 1249 10/16/16 1942  BNP 639.0* 900.0*     CBG:  Recent Labs Lab 12/30/16 2038 12/31/16 0715 12/31/16 1119  GLUCAP 253* 315* 321*    Recent Results (from the past 240 hour(s))  Blood Culture (routine x 2)     Status:  None (Preliminary result)   Collection Time: 12/30/16  4:28 PM  Result Value Ref Range Status   Specimen Description BLOOD RIGHT ANTECUBITAL  Final   Special Requests   Final    BOTTLES DRAWN AEROBIC AND ANAEROBIC Blood Culture adequate volume   Culture NO GROWTH < 24 HOURS  Final   Report Status PENDING  Incomplete  Blood Culture (routine x 2)     Status: None (Preliminary result)   Collection Time: 12/30/16  4:33 PM  Result Value Ref Range Status   Specimen Description BLOOD BLOOD RIGHT HAND  Final   Special Requests   Final    BOTTLES DRAWN AEROBIC AND ANAEROBIC Blood Culture adequate volume   Culture NO GROWTH < 24 HOURS   Final   Report Status PENDING  Incomplete  MRSA PCR Screening     Status: None   Collection Time: 12/31/16 12:50 AM  Result Value Ref Range Status   MRSA by PCR NEGATIVE NEGATIVE Final    Comment:        The GeneXpert MRSA Assay (FDA approved for NASAL specimens only), is one component of a comprehensive MRSA colonization surveillance program. It is not intended to diagnose MRSA infection nor to guide or monitor treatment for MRSA infections.      Studies: Dg Chest Portable 1 View  Result Date: 12/30/2016 CLINICAL DATA:  Chest pain and shortness of breath. Uncontrolled atrial fibrillation. Recent dialysis. EXAM: PORTABLE CHEST 1 VIEW COMPARISON:  12/05/2016 FINDINGS: Stable mild cardiomegaly.  Aortic atherosclerosis. Increased size of small right pleural effusion. No evidence of pulmonary consolidation or frank pulmonary edema. IMPRESSION: Increased size of small right pleural effusion. Stable mild cardiomegaly. Electronically Signed   By: Earle Gell M.D.   On: 12/30/2016 16:53    Scheduled Meds: . amiodarone  100 mg Oral QPM  . aspirin EC  81 mg Oral Daily  . atorvastatin  20 mg Oral QHS  . calcium acetate  667 mg Oral TID WC  . carvedilol  6.25 mg Oral BID  . doxazosin  8 mg Oral QPM  . furosemide  40 mg Oral BID  . insulin aspart  0-9 Units Subcutaneous TID WC  . isosorbide mononitrate  60 mg Oral Daily  . lisinopril  20 mg Oral QPM  . pantoprazole  40 mg Oral Daily  . sodium chloride flush  3 mL Intravenous Q12H  . spironolactone  25 mg Oral BID   Continuous Infusions: . sodium chloride    . amiodarone 30 mg/hr (12/31/16 0954)  . heparin 1,150 Units/hr (12/31/16 3532)    Assessment/Plan:  1. Atrial fibrillation with rapid ventricular response. Patient on amiodarone drip. Hopefully converted over to oral amiodarone tomorrow morning. Patient also on oral Coreg. [facility] cardiology consultation. Patient on heparin drip for anticoagulation at this point. 2. Acute  diastolic congestive heart failure. Continue Coreg, Lasix, lisinopril. 3. Large left pleural effusion, moderate right pleural effusion. Case discussed with Dr. Genevive Bi cardiothoracic surgery. He was planning on doing a procedure on Monday and realized the patient was in the hospital. He will check his schedule and see if he is able to do the procedure tomorrow or while he is in the hospital. 4. End-stage renal disease on hemodialysis. Had a full hemodialysis yesterday 5. Weakness. Physical therapy evaluation 6. End-stage COPD on chronic oxygen 7. Type 2 diabetes mellitus on sliding scale. 8. 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 Close follow-up will be needed on this.  Code Status:  Code Status Orders        Start     Ordered   12/30/16 1958  Full code  Continuous     12/30/16 1957    Code Status History    Date Active Date Inactive Code Status Order ID Comments User Context   11/25/2016  5:59 AM 11/27/2016  8:08 PM Full Code [id number]  Harrie Foreman, MD Inpatient   10/31/2016  1:48 AM 10/31/2016  7:29 PM Full Code [id number]  Saundra Shelling, MD Inpatient   10/16/2016 11:41 PM 10/21/2016 12:21 AM Full Code [id number]  Vaughan Basta, MD ED   09/14/2016  6:31 PM 09/24/2016  2:43 AM Full Code [id number]  Nicholes Mango, MD Inpatient   08/26/2016  6:45 AM 08/28/2016  8:11 PM Full Code [id number]  Saundra Shelling, MD Inpatient   08/20/2016  3:05 AM 08/22/2016  1:01 PM Full Code [id number]  Lance Coon, MD ED    Advance Directive Documentation     Most Recent Value  Type of Advance Directive  Healthcare Power of Attorney  Pre-existing out of facility DNR order (yellow form or pink MOST form)  -  "MOST" Form in Place?  -     Family Communication: Spoke with the daughter on the phone Disposition Plan: To be determined  Consultants:  Cardiology  Cardiothoracic surgery   Time spent: 40 minutes including ACP time  [place], [place]

## 2016-12-31 NOTE — Telephone Encounter (Signed)
I spoke with Dr. Genevive Bi, he will see patient as inpatient and address Pleural Effusion concerns as well as determine what is the next step in process regarding surgery.

## 2016-12-31 NOTE — Telephone Encounter (Signed)
Upon working up patient for clearances for surgery, I have discovered that he is currently in patient for A-Fib with RVR and is currently on an Amiodarone drip. I have notified Dr. Genevive Bi of this and am awaiting a call back currently to see what he would like to do with patient's plan of care.

## 2016-12-31 NOTE — Progress Notes (Signed)
MD Marcille Blanco made aware of new troponin level, no new orders received.

## 2016-12-31 NOTE — Progress Notes (Signed)
Patient ID: Calvin Good, male   DOB: September 26, 1940, 76 y.o.   MRN: [id number]  ACP discussion  Patient states if he doesn't feel better what's the use of living. This brought on ACP discussion. Patient deferred palliative care consultation while here. CODE STATUS discussed and patient would like to remain a full code. Diagnosis: End-stage renal disease on dialysis, atrial fibrillation with rapid ventricular response, acute on chronic diastolic congestive heart failure, end-stage COPD, recurrent pleural effusion.  Case also discussed with daughter. They have palliative care that follows as outpatient. Patient has progressed with his symptoms and has 7 hospitalizations in short period of time.  Time spent on ACP discussion 17 minutes  Dr. Loletha Grayer

## 2016-12-31 NOTE — Progress Notes (Addendum)
ANTICOAGULATION CONSULT NOTE -  Follow UP   Pharmacy Consult for heparin drip Indication: chest pain/ACS/elevated troponin  Allergies  Allergen Reactions  . Tetracyclines & Related Other (See Comments)    Reaction: Unknown     Patient Measurements: Height: 5\' 8"  (172.7 cm) Weight: 207 lb (93.9 kg) IBW/kg (Calculated) : 68.4 Heparin Dosing Weight: 88 kg  Vital Signs: Temp: 98.1 F (36.7 C) (10/02 1118) Temp Source: Oral (10/02 1118) BP: 120/36 (10/02 1118) Pulse Rate: 71 (10/02 1118)  Labs:  Recent Labs  12/30/16 1624 12/30/16 2036 12/31/16 0151 12/31/16 0532 12/31/16 0941 12/31/16 1350  HGB 10.6*  --  9.6*  --   --   --   HCT 30.5*  --  27.9*  --   --   --   PLT 167  --  143*  --   --   --   APTT  --   --   --  39*  --   --   LABPROT  --   --   --  14.2  --   --   INR  --   --   --  1.11  --   --   HEPARINUNFRC  --   --   --   --   --  0.34  CREATININE 3.62*  --  4.52*  --   --   --   TROPONINI <0.03 0.11* 0.53*  --  0.56*  --     Estimated Creatinine Clearance: 15 mL/min (A) (by C-G formula based on SCr of 4.52 mg/dL (H)).   Medical History: Past Medical History:  Diagnosis Date  . Cancer [facility])    Prostate  . CHF (congestive heart failure) ([place])   . Chronic kidney disease   . Complication of anesthesia    hard to wake up  . COPD (chronic obstructive pulmonary disease) ([place])   . Diabetes mellitus without complication ([place])   . Difficult intubation   . Dyspnea   . GERD (gastroesophageal reflux disease)   . Hyperlipidemia   . Hypertension   . Pleural effusion   . Renal insufficiency     Medications:  No anticoagulation in PTA meds  Assessment:  Goal of Therapy:  Heparin level 0.3-0.7 units/ml Monitor platelets by anticoagulation protocol: Yes   Plan:  Heparin level resulted @ 0.34. Will continue current rate of heparin. Will recheck heparin level @ 2200  James,Teldrin D 12/31/2016,3:11 PM     10/02 2300 heparin level 0.22. 1300 unit  bolus and increase rate to 1350 units/hr. Recheck in 8 hours.  Sim Boast, PharmD, BCPS  01/01/17 12:13 AM

## 2016-12-31 NOTE — Progress Notes (Signed)
  Patient ID: Calvin Good, male   DOB: 1940/04/23, 76 y.o.   MRN: [id number]  HISTORY: This patient is well known to me. He is a 76 year old gentleman with a history of end-stage renal disease on hemodialysis. He has had a recurrent right-sided pleural effusion and was evaluated for that. He does not want a Pleurx catheter. He previously had had a peritoneal dialysis catheter and found that lifestyle not to be as his liking. He therefore like a definitive procedure performed. He came into the hospital because of acute shortness of breath and was found to be in rapid atrial fibrillation. He had a chest x-ray made which showed an enlarging right-sided pleural effusion. He was begun on amiodarone and heparin. He is now in sinus rhythm.   Vitals:   12/31/16 1118 12/31/16 1413  BP: (!) 120/36   Pulse: 71   Resp: 19   Temp: 98.1 F (36.7 C)   SpO2: (!) 88% (!) 84%     EXAM:    Resp: Lungs are clear on the left but diminished on the right.  No respiratory distress, normal effort. Heart:  Regular without murmurs Abd:  Abdomen is soft, non distended and non tender. No masses are palpable.  There is no rebound and no guarding.  Neurological: Alert and oriented to person, place, and time. Coordination normal.  Skin: Skin is warm and dry. No rash noted. No diaphoretic. No erythema. No pallor.  Psychiatric: He appears somewhat disappointed regarding his current condition.. Normal behavior. Judgment and thought content normal.    ASSESSMENT: Enlarging right-sided pleural effusion.   PLAN:   I have discussed his care with our staff. Unfortunately there are no openings that are obviously available within the next few days. He currently is scheduled to undergo surgery next Monday. It would appear that that would be the soonest this can be accomplished. Therefore he should remain on his heparin until Sunday at midnight and that it should be stopped. He should have his dialysis Monday morning as  previously planned. He will require several days in the hospital after surgery. I will continue to follow the patient with you.    Nestor Lewandowsky, MD

## 2016-12-31 NOTE — Progress Notes (Signed)
ANTICOAGULATION CONSULT NOTE - Initial Consult  Pharmacy Consult for heparin drip Indication: chest pain/ACS/elevated troponin  Allergies  Allergen Reactions  . Tetracyclines & Related Other (See Comments)    Reaction: Unknown     Patient Measurements: Height: 5\' 8"  (172.7 cm) Weight: 207 lb (93.9 kg) IBW/kg (Calculated) : 68.4 Heparin Dosing Weight: 88 kg  Vital Signs: Temp: 97.9 F (36.6 C) (10/02 0337) Temp Source: Oral (10/02 0337) BP: 131/43 (10/02 0337) Pulse Rate: 65 (10/02 0337)  Labs:  Recent Labs  12/30/16 1624 12/30/16 2036 12/31/16 0151  HGB 10.6*  --  9.6*  HCT 30.5*  --  27.9*  PLT 167  --  143*  CREATININE 3.62*  --  4.52*  TROPONINI <0.03 0.11* 0.53*    Estimated Creatinine Clearance: 15 mL/min (A) (by C-G formula based on SCr of 4.52 mg/dL (H)).   Medical History: Past Medical History:  Diagnosis Date  . Cancer [facility])    Prostate  . CHF (congestive heart failure) ([place])   . Chronic kidney disease   . Complication of anesthesia    hard to wake up  . COPD (chronic obstructive pulmonary disease) ([place])   . Diabetes mellitus without complication ([place])   . Difficult intubation   . Dyspnea   . GERD (gastroesophageal reflux disease)   . Hyperlipidemia   . Hypertension   . Pleural effusion   . Renal insufficiency     Medications:  No anticoagulation in PTA meds  Assessment:  Goal of Therapy:  Heparin level 0.3-0.7 units/ml Monitor platelets by anticoagulation protocol: Yes   Plan:  4000 unit bolus and initial rate of 1150 units/hr. First heparin level 8 hours after start of infusion.  Hajira Verhagen S 12/31/2016,5:58 AM

## 2016-12-31 NOTE — Telephone Encounter (Addendum)
Spoke with patient's Home Nurse, Judeen Hammans from [facility] [phone] at this time. She states that patient was just re-certified for 6 additional weeks of Home Health and that a referral would not be necessary post surgery. However, she will need home health orders faxed to [phone] when patient is discharged from hospital stating exactly what patient will need, how often to drain Pleurx and if there is an amount not to exceed. Also, if patient needs PT/OT- this will need to be placed on the orders and fax all to [employer].  I explained that I was unable to determine when or if patient will have surgery now secondary to his admission for A-Fib with RVR. But, I will send over orders if patient does indeed has surgery and needs additional orders.

## 2016-12-31 NOTE — Progress Notes (Signed)
[facility], [place] 12/31/16  Subjective:   Patient known to our practice from outpatient dialysis He was sent from dialysis for chest pain He was noted to be in atrial fibrillation with RVR with heart rate of greater than 130 This morning, he still complains of chest pain although appears comfortable  Objective:  Vital signs in last 24 hours:  Temp:  [97.6 F (36.4 C)-98.3 F (36.8 C)] 98.1 F (36.7 C) (10/02 1118) Pulse Rate:  [65-118] 71 (10/02 1118) Resp:  [16-21] 19 (10/02 1118) BP: (103-157)/(36-71) 120/36 (10/02 1118) SpO2:  [84 %-99 %] 84 % (10/02 1413) Weight:  [93.9 kg (207 lb)-97.5 kg (215 lb)] 93.9 kg (207 lb) (10/02 0337)  Weight change:  Filed Weights   12/30/16 1617 12/30/16 1946 12/31/16 0337  Weight: 97.5 kg (215 lb) 94.3 kg (207 lb 12.8 oz) 93.9 kg (207 lb)    Intake/Output:    Intake/Output Summary (Last 24 hours) at 12/31/16 1520 Last data filed at 12/31/16 1359  Gross per 24 hour  Intake                0 ml  Output                0 ml  Net                0 ml     Physical Exam: General: Chronically ill-appearing, lying in the bed  HEENT Eyes are closed  Neck supple  Pulm/lungs shallow breathing effort, decreased breath sounds at bases  CVS/Heart Irregular  Abdomen:  Soft, nontender  Extremities: No edema  Neurologic: Somnolent; able to answer quesions  Skin: No acute rashes          Basic Metabolic Panel:   Recent Labs Lab 12/30/16 1624 12/31/16 0151  NA 136 133*  K 3.4* 3.9  CL 95* 94*  CO2 29 28  GLUCOSE 221* 403*  BUN 36* 44*  CREATININE 3.62* 4.52*  CALCIUM 8.6* 7.9*     CBC:  Recent Labs Lab 12/30/16 1624 12/31/16 0151  WBC 10.3 9.8  HGB 10.6* 9.6*  HCT 30.5* 27.9*  MCV 85.2 86.3  PLT 167 143*     Lab Results  Component Value Date   HEPBSAG Negative 11/27/2016      Microbiology:  Recent Results (from the past 240 hour(s))  Blood Culture (routine x 2)     Status: None  (Preliminary result)   Collection Time: 12/30/16  4:28 PM  Result Value Ref Range Status   Specimen Description BLOOD RIGHT ANTECUBITAL  Final   Special Requests   Final    BOTTLES DRAWN AEROBIC AND ANAEROBIC Blood Culture adequate volume   Culture NO GROWTH < 24 HOURS  Final   Report Status PENDING  Incomplete  Blood Culture (routine x 2)     Status: None (Preliminary result)   Collection Time: 12/30/16  4:33 PM  Result Value Ref Range Status   Specimen Description BLOOD BLOOD RIGHT HAND  Final   Special Requests   Final    BOTTLES DRAWN AEROBIC AND ANAEROBIC Blood Culture adequate volume   Culture NO GROWTH < 24 HOURS  Final   Report Status PENDING  Incomplete  MRSA PCR Screening     Status: None   Collection Time: 12/31/16 12:50 AM  Result Value Ref Range Status   MRSA by PCR NEGATIVE NEGATIVE Final    Comment:        The GeneXpert MRSA Assay (FDA  approved for NASAL specimens only), is one component of a comprehensive MRSA colonization surveillance program. It is not intended to diagnose MRSA infection nor to guide or monitor treatment for MRSA infections.     Coagulation Studies:  Recent Labs  12/31/16 0532  LABPROT 14.2  INR 1.11    Urinalysis: No results for input(s): COLORURINE, LABSPEC, PHURINE, GLUCOSEU, HGBUR, BILIRUBINUR, KETONESUR, PROTEINUR, UROBILINOGEN, NITRITE, LEUKOCYTESUR in the last 72 hours.  Invalid input(s): APPERANCEUR    Imaging: Dg Chest Portable 1 View  Result Date: 12/30/2016 CLINICAL DATA:  Chest pain and shortness of breath. Uncontrolled atrial fibrillation. Recent dialysis. EXAM: PORTABLE CHEST 1 VIEW COMPARISON:  12/05/2016 FINDINGS: Stable mild cardiomegaly.  Aortic atherosclerosis. Increased size of small right pleural effusion. No evidence of pulmonary consolidation or frank pulmonary edema. IMPRESSION: Increased size of small right pleural effusion. Stable mild cardiomegaly. Electronically Signed   By: Earle Gell M.D.   On:  12/30/2016 16:53     Medications:   . sodium chloride    . amiodarone 30 mg/hr (12/31/16 0954)  . heparin 1,150 Units/hr (12/31/16 0620)   . amiodarone  100 mg Oral QPM  . aspirin EC  81 mg Oral Daily  . atorvastatin  20 mg Oral QHS  . calcium acetate  667 mg Oral TID WC  . carvedilol  6.25 mg Oral BID  . doxazosin  8 mg Oral QPM  . furosemide  40 mg Oral BID  . insulin aspart  0-9 Units Subcutaneous TID WC  . insulin detemir  15 Units Subcutaneous QHS  . isosorbide mononitrate  60 mg Oral Daily  . lisinopril  20 mg Oral QPM  . pantoprazole  40 mg Oral Daily  . sodium chloride flush  3 mL Intravenous Q12H  . spironolactone  25 mg Oral BID   sodium chloride, acetaminophen **OR** acetaminophen, fentaNYL (SUBLIMAZE) injection, HYDROcodone-acetaminophen, senna-docusate, sodium chloride flush, traMADol  Assessment/ Plan:  76 y.o. male with diabetes mellitus type II insulin dependent, hypertension, coronary artery disease, hyperlipidemia, prostate cancer status post prostatectomy, right total knee, appendectomy.   MWF [facility] [place] [facility]  1. End Stage Renal Disease MWF: - we'll schedule his dialysis for tomorrow  2. Anemia of chronic kidney disease/Acute GI bleed 11/26/16.  Hemoglobin 9.6 Continue Procrit with dialysis  3. 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  - Management as per hospitalist.    4. Secondary Hyperparathyroidism:  - monitor phosphorus during hospitalization  5. A Fib with RVR:  It is a recurrent problem Cardiology evaluation ongoing   LOS: 1 [place] 10/2/20183:20 PM  [place], [place]

## 2016-12-31 NOTE — Consult Note (Signed)
[facility] Cardiology Consultation Note  Patient ID: Calvin Good, MRN: [id number], DOB/AGE: 10-03-1940 76 y.o. Admit date: 12/30/2016   Date of Consult: 12/31/2016 Primary Physician: [name], MD Primary Cardiologist:[place]  Chief Complaint:  Chief Complaint  Patient presents with  . Chest Pain   Reason for Consult: atrial fibrillation with chest pain  HPI: 76 y.o. male with known paroxysmal nonvalvular atrial fibrillation essential hypertension mixed hyperlipidemia chronic kidney disease anemia and coronary atherosclerosis who is had significant issues with concerns of hypotension rapid heart rate and chest discomfort while at dialysis. When the patient was seen in the emergency room the patient had atrial fibrillation with rapid ventricular rate and some chest pain but no evidence of significant new EKG changes. The patient does have an elevation of troponin of 0.5 consistent with demand ischemia at this time with atrial fibrillation with rapid ventricular rate and does continue to have intermittent episodes of chest pain of unknown etiology although currently treated with pain medications. The patient has had an improvement in his heart rate control of atrial fibrillation and now spontaneously converted to normal sinus rhythm after amiodarone drip which is improved. Additionally hypertension control is improved and he is continued on isosorbide for episodes of chest discomfort as well. Today he still has some mild amount of chest discomfort which is waxing and waning and atypical for coronary disease  Past Medical History:  Diagnosis Date  . Cancer [facility])    Prostate  . CHF (congestive heart failure) ([place])   . Chronic kidney disease   . Complication of anesthesia    hard to wake up  . COPD (chronic obstructive pulmonary disease) ([place])   . Diabetes mellitus without complication ([place])   . Difficult intubation   . Dyspnea   . GERD (gastroesophageal reflux disease)   .  Hyperlipidemia   . Hypertension   . Pleural effusion   . Renal insufficiency       Surgical History:  Past Surgical History:  Procedure Laterality Date  . APPENDECTOMY    . AV FISTULA PLACEMENT Left 05/30/2016   Procedure: ARTERIOVENOUS (AV) FISTULA CREATION ( BRACHIOCEPHALIC );  Surgeon: [name], MD;  Location: [facility] ORS;  Service: Vascular;  Laterality: Left;  . CAPD INSERTION N/A 05/30/2016   Procedure: LAPAROSCOPIC INSERTION CONTINUOUS AMBULATORY PERITONEAL DIALYSIS  (CAPD) CATHETER;  Surgeon: [name], MD;  Location: [facility] ORS;  Service: Vascular;  Laterality: N/A;  . DIALYSIS/PERMA CATHETER INSERTION    . DIALYSIS/PERMA CATHETER REMOVAL N/A 08/21/2016   Procedure: Dialysis/Perma Catheter Removal;  Surgeon: [name], MD;  Location: [place] CV LAB;  Service: Cardiovascular;  Laterality: N/A;  . JOINT REPLACEMENT     right knee   . LEFT HEART CATH AND CORONARY ANGIOGRAPHY N/A 08/21/2016   Procedure: Left Heart Cath and Coronary Angiography possible PCI;  Surgeon: [name], MD;  Location: [place] CV LAB;  Service: Cardiovascular;  Laterality: N/A;  . postate removal     . PROSTATE SURGERY    . PSEUDOANERYSM COMPRESSION Right 08/27/2016   Procedure: Pseudoanerysm Compression;  Surgeon: Katha Cabal, MD;  Location: [place] CV LAB;  Service: Cardiovascular;  Laterality: Right;  . REMOVAL OF A DIALYSIS CATHETER N/A 08/08/2016   Procedure: REMOVAL OF A DIALYSIS CATHETER;  Surgeon: [name], MD;  Location: [facility] ORS;  Service: Vascular;  Laterality: N/A;     Home Meds: Prior to Admission medications   Medication Sig Start Date End Date Taking? Authorizing  Provider  acetaminophen (TYLENOL) 325 MG tablet Take 2 tablets (650 mg total) by mouth every 6 (six) hours as needed for mild pain (or Fever >/= 101). 09/23/16  Yes Gouru, Illene Silver, MD  amLODipine (NORVASC) 5 MG tablet Take 5 mg by mouth daily.   Yes [provider]  aspirin EC 81 MG tablet  Take 81 mg by mouth daily.   Yes [provider]  atorvastatin (LIPITOR) 20 MG tablet Take 20 mg by mouth at bedtime.    Yes [provider]  calcium acetate (PHOSLO) 667 MG capsule Take 1,334 mg by mouth 3 (three) times daily with meals.    Yes [provider]  carvedilol (COREG) 6.25 MG tablet Take 6.25 mg by mouth 2 (two) times daily. 8 am, 5 pm 09/05/16  Yes [provider]  doxazosin (CARDURA) 8 MG tablet Take 8 mg by mouth every evening.    Yes [provider]  furosemide (LASIX) 40 MG tablet Take 40 mg by mouth 2 (two) times daily.    Yes [provider]  GUAIFENESIN 1200 PO Take 1,200 mg by mouth at bedtime.   Yes [provider]  indomethacin (INDOCIN) 50 MG capsule Take 50 mg by mouth 2 (two) times daily with a meal.   Yes [provider]  insulin detemir (LEVEMIR) 100 UNIT/ML injection Inject 30 Units into the skin at bedtime.   Yes [provider]  insulin lispro (HUMALOG) 100 UNIT/ML injection Inject 10 Units into the skin 3 (three) times daily before meals.   Yes [provider]  ipratropium-albuterol (DUONEB) 0.5-2.5 (3) MG/3ML SOLN Take 3 mLs by nebulization every 6 (six) hours as needed. 09/23/16  Yes Gouru, Illene Silver, MD  isosorbide mononitrate (IMDUR) 30 MG 24 hr tablet Take 30 mg by mouth daily.  11/28/16  Yes [provider]  Lidocaine-Prilocaine, Bulk, 2.5-2.5 % CREA Apply 1 application topically as needed. Apply to fistula site prior to dialysis treatments as needed   Yes [provider]  lisinopril (PRINIVIL,ZESTRIL) 20 MG tablet Take 20 mg by mouth every evening.    Yes [provider]  omeprazole (PRILOSEC) 20 MG capsule Take 20 mg by mouth 2 (two) times daily before a meal.    Yes [provider]  OXYGEN Inhale 2-5 L into the lungs continuous.    Yes [provider]  spironolactone (ALDACTONE) 25 MG tablet Take 25 mg by mouth daily.    Yes [provider]  traMADol (ULTRAM) 50 MG tablet Take 1 tablet (50 mg total) by mouth every 12 (twelve) hours as needed for moderate pain or severe pain. 11/27/16 11/27/17 Yes Gladstone Lighter, MD    Inpatient Medications:  . amiodarone  100 mg Oral QPM  . aspirin EC  81 mg Oral Daily  . atorvastatin  20 mg Oral QHS  . calcium acetate  667 mg Oral TID WC  . carvedilol  6.25 mg Oral BID  . doxazosin  8 mg Oral QPM  . furosemide  40 mg Oral BID  . insulin aspart  0-9 Units Subcutaneous TID WC  . isosorbide mononitrate  60 mg Oral Daily  . lisinopril  20 mg Oral QPM  . pantoprazole  40 mg Oral Daily  . sodium chloride flush  3 mL Intravenous Q12H  . spironolactone  25 mg Oral BID   . sodium chloride    . amiodarone 30 mg/hr (12/31/16 0014)  . heparin 1,150 Units/hr (12/31/16 9357)    Allergies:  Allergies  Allergen Reactions  . Tetracyclines & Related Other (See Comments)    Reaction: Unknown     Social History   Social History  . Marital status: Widowed    Spouse name: N/A  . Number of children: N/A  . Years of education: N/A   Occupational History  . retired    Social History Main Topics  . Smoking status: Former Smoker    Types: Cigarettes    Quit date: 05/21/1982  . Smokeless tobacco: Never Used  . Alcohol use 1.8 oz/week    3 Glasses of wine per week  . Drug use: No  . Sexual activity: No   Other Topics Concern  . Not on file   Social History Narrative  . No narrative on file     Family History  Problem Relation Age of Onset  . Colon cancer Father   . Brain cancer Mother   . CAD Neg Hx   . Diabetes Neg Hx      Review of Systems Positive forChest pain shortness of breath weakness fatigue Negative for: General:  chills, fever, night sweats or weight changes.  Cardiovascular: PND orthopnea syncope dizziness  Dermatological skin lesions rashes Respiratory: Cough congestion Urologic: Frequent urination urination at night and hematuria Abdominal:  negative for nausea, vomiting, diarrhea, bright red blood per rectum, melena, or hematemesis Neurologic: negative for visual changes, and/or hearing changes  All other systems reviewed and are otherwise negative except as noted above.  Labs:  Recent Labs  12/30/16 1624 12/30/16 2036 12/31/16 0151  TROPONINI <0.03 0.11* 0.53*   Lab Results  Component Value Date   WBC 9.8 12/31/2016   HGB 9.6 (L) 12/31/2016   HCT 27.9 (L) 12/31/2016   MCV 86.3 12/31/2016   PLT 143 (L) 12/31/2016    Recent Labs Lab 12/31/16 0151  NA 133*  K 3.9  CL 94*  CO2 28  BUN 44*  CREATININE 4.52*  CALCIUM 7.9*  GLUCOSE 403*   Lab Results  Component Value Date   CHOL 160 12/30/2016   HDL 29 (L) 12/30/2016   LDLCALC 89 12/30/2016   TRIG 211 (H) 12/30/2016   No results found for: DDIMER  Radiology/Studies:  Dg Chest Portable 1 View  Result Date: 12/30/2016 CLINICAL DATA:  Chest pain and shortness of breath. Uncontrolled atrial fibrillation. Recent dialysis. EXAM: PORTABLE CHEST 1 VIEW COMPARISON:  12/05/2016 FINDINGS: Stable mild cardiomegaly.  Aortic atherosclerosis. Increased size of small right pleural effusion. No evidence of pulmonary consolidation or frank pulmonary edema. IMPRESSION: Increased size of small right pleural effusion. Stable mild cardiomegaly. Electronically Signed   By: Earle Gell M.D.   On: 12/30/2016 16:53   Dg Chest Port 1 View  Result Date: 12/05/2016 CLINICAL DATA:  Right pleural effusion, status post thoracentesis EXAM: PORTABLE CHEST 1 VIEW COMPARISON:  12/04/2016 FINDINGS: Small residual right pleural effusion following thoracentesis. No pneumothorax. Minor right base atelectasis. Normal heart size and vascularity. Left lung remains clear. Atherosclerosis of the aorta. Trachea is midline IMPRESSION: Residual small right pleural effusion following thoracentesis. Negative for pneumothorax. Stable exam. Electronically Signed   By: Jerilynn Mages.  Shick M.D.   On: 12/05/2016 09:44   Dg  Chest Portable 1 View  Result Date: 12/04/2016 CLINICAL DATA:  Weakness and fatigue after dialysis today. History of atrial fibrillation, metastatic prostate cancer. EXAM: PORTABLE CHEST 1 VIEW COMPARISON:  Chest radiograph November 25, 2016 and PET-CT October 22, 2016. FINDINGS: Stable cardiomegaly. Calcified aortic knob. Moderate RIGHT pleural effusion and underlying consolidation  persists. LEFT lung is clear. Mild pulmonary vascular congestion. No pneumothorax. Soft tissue planes and included osseous structures are unchanged. IMPRESSION: Similar to increased moderate RIGHT pleural effusion with underlying consolidation. Mild cardiomegaly and pulmonary vascular congestion. Electronically Signed   By: Elon Alas M.D.   On: 12/04/2016 21:32   US Thoracentesis Asp Pleural Space W/img Guide  Result Date: 12/05/2016 INDICATION: CHF, end-stage renal disease, recurrent right pleural effusion, shortness of breath EXAM: ULTRASOUND GUIDED RIGHT THORACENTESIS MEDICATIONS: 1% lidocaine local COMPLICATIONS: None immediate. PROCEDURE: An ultrasound guided thoracentesis was thoroughly discussed with the patient and questions answered. The benefits, risks, alternatives and complications were also discussed. The patient understands and wishes to proceed with the procedure. Written consent was obtained. Ultrasound was performed to localize and mark an adequate pocket of fluid in the right chest. The area was then prepped and draped in the normal sterile fashion. 1% Lidocaine was used for local anesthesia. Under ultrasound guidance a 6 Fr Safe-T-Centesis catheter was introduced. Thoracentesis was performed. The catheter was removed and a dressing applied. FINDINGS: A total of approximately 1.2 L of clear pleural fluid was removed. Samples were sent to the laboratory as requested by the clinical team. IMPRESSION: Successful ultrasound guided right thoracentesis yielding 1.2 L of pleural fluid. Electronically Signed   By: Jerilynn Mages.   Shick M.D.   On: 12/05/2016 09:18    EKG: Atrial fibrillation with rapid ventricular rate  Weights: Filed Weights   12/30/16 1617 12/30/16 1946 12/31/16 0337  Weight: 97.5 kg (215 lb) 94.3 kg (207 lb 12.8 oz) 93.9 kg (207 lb)     Physical Exam: Blood pressure (!) 126/48, pulse 70, temperature 97.6 F (36.4 C), temperature source Oral, resp. rate 20, height 5\' 8"  (1.727 m), weight 93.9 kg (207 lb), SpO2 95 %. Body mass index is 31.47 kg/m. General: Well developed, well nourished, in no acute distress. Head eyes ears nose throat: Normocephalic, atraumatic, sclera non-icteric, no xanthomas, nares are without discharge. No apparent thyromegaly and/or mass  Lungs: Normal respiratory effort.Diffuse wheezes, some basilar rales, no rhonchi.  Heart: RRR with normal S1 S2. no murmur gallop, no rub, PMI is normal size and placement, carotid upstroke normal without bruit, jugular venous pressure is normal Abdomen: Soft, non-tender, non-distended with normoactive bowel sounds. No hepatomegaly. No rebound/guarding. No obvious abdominal masses. Abdominal aorta is normal size without bruit Extremities: 1+ edema. no cyanosis, no clubbing, no ulcers  Peripheral : 2+ bilateral upper extremity pulses, 2+ bilateral femoral pulses, 2+ bilateral dorsal pedal pulse Neuro: Alert and oriented. No facial asymmetry. No focal deficit. Moves all extremities spontaneously. Musculoskeletal: Normal muscle tone without kyphosis Psych:  Responds to questions appropriately with a normal affect.    Assessment: 76 year old male with paroxysmal nonvalvular atrial fibrillation anemia chronic kidney disease diastolic dysfunction congestive heart failure with elevated troponin consistent with demand ischemia and no current evidence of myocardial infarction although some atypical chest pain nightly improved from admission  Plan: 1. Continue amiodarone drip for amiodarone load and changed to oral amiodarone tomorrow at 400 mg  twice per day for maintenance of normal sinus rhythm 2. Anticoagulation if able although patient does have the significant anemia and now is in normal sinus rhythm watching closely for concerns of stroke risk with atrial fibrillation 3. Continue carvedilol and lisinopril spironolactone for hypertension control 4. Dialysis as necessary for continued due to chronic kidney disease 5. No further cardiac diagnostics necessary at this time  Signed, Corey Skains M.D. [facility] 12/31/2016, 8:45  AM

## 2016-12-31 NOTE — Evaluation (Signed)
Physical Therapy Evaluation Patient Details Name: Calvin Good MRN: [id number] DOB: June 20, 1940 Today's Date: 12/31/2016   History of Present Illness  presented to ER from dialysis secondary to chest pain, elevated HR (130-150s); admitted with afib with RVR, ultimately requiring amioderone drip for control and conversion to NSR.  Clinical Impression  Upon evaluation, patient alert and oriented; follows all commands and demonstrates good effort with all therapeutic activities despite obvious fatigue.  Bilat UE/LE strength and ROM grossly symmetrical and WFL for basic transfers and mobility.  Demonstrates ability to complete sit/stand, basic transfers and gait (50-100') with and without assist device, no significant change in performance noted (in fact, somewhat more symmetrical, fluid without assist device). Patient currently preferring gait without assist device and feels near baseline, with exception of generalized fatigue/weakness related to acute illness.  BORG 8/9-10 after gait efforts with noted desat to 84% on 4L supplemental O2; requiring 2 min seated rest for recovery.  May benefit from education regarding energy conservation, activity pacing to maximize O2 support with all functional activities in home environment.] Would benefit from skilled PT to address above deficits and promote optimal return to PLOF; Recommend transition to [place] upon discharge from acute hospitalization.  May be good candidate for outpatient cardiac/pulmonary rehab program as able to tolerate.     Follow Up Recommendations Home health PT (may benefit from outpatient cardiac/pulmonary rehab)    Equipment Recommendations       Recommendations for Other Services       Precautions / Restrictions Precautions Precautions: Fall Precaution Comments: No BP L UE Restrictions Weight Bearing Restrictions: No      Mobility  Bed Mobility               General bed mobility comments: seated edge of bed beginning  of session; in recliner end of session  Transfers Overall transfer level: Needs assistance Equipment used: Rolling walker (2 wheeled) Transfers: Sit to/from Stand Sit to Stand: Supervision         General transfer comment: minimal use of UEs, minimal use of assist device  Ambulation/Gait Ambulation/Gait assistance: Supervision Ambulation Distance (Feet): 50 Feet Assistive device: Rolling walker (2 wheeled)       General Gait Details: reciprocal stepping pattern with fair step height/length; slow, guarded cadence and overall gait speed; limited arm swing, trunk rotation.  Eager for release of RW (denies need)  Financial trader Rankin (Stroke Patients Only)       Balance Overall balance assessment: Needs assistance Sitting-balance support: No upper extremity supported;Feet supported Sitting balance-Leahy Scale: Good     Standing balance support: Bilateral upper extremity supported Standing balance-Leahy Scale: Fair                               Pertinent Vitals/Pain Pain Assessment: No/denies pain    Home Living Family/patient expects to be discharged to:: Private residence Living Arrangements: Children Available Help at Discharge: Family;Available 24 hours/day Type of Home: House Home Access: Stairs to enter Entrance Stairs-Rails: Chemical engineer of Steps: 4 Home Layout: One level        Prior Function Level of Independence: Independent         Comments: Indep with ADLs, household and limited community mobility (public transport to/from dialysis) without assist device; home O2 at 4-6L per patient report.  Denies fall history.  Hand Dominance        Extremity/Trunk Assessment   Upper Extremity Assessment Upper Extremity Assessment: Overall WFL for tasks assessed    Lower Extremity Assessment Lower Extremity Assessment: Overall WFL for tasks assessed (grossly at least 4/5  throughout)       Communication   Communication: No difficulties  Cognition Arousal/Alertness: Awake/alert Behavior During Therapy: WFL for tasks assessed/performed Overall Cognitive Status: Within Functional Limits for tasks assessed                                        General Comments      Exercises Other Exercises Other Exercises: 27' with RW, cga/close sup-improved step height/length, improved trunk rotation and arm swing; patient prefers gait without assist device and feels performance is near baseline (with exception of generallized fatigue/weakness related to acute illness).  BORG 8-9/10 after 125' total distance with desat to 84% on 4L (requiring approx 2 min seated rest for recovery)   Assessment/Plan    PT Assessment Patient needs continued PT services  PT Problem List Decreased strength;Decreased range of motion;Decreased activity tolerance;Decreased balance;Decreased mobility;Decreased coordination;Decreased knowledge of precautions;Cardiopulmonary status limiting activity       PT Treatment Interventions DME instruction;Gait training;Stair training;Functional mobility training;Therapeutic activities;Therapeutic exercise;Balance training;Patient/family education    PT Goals (Current goals can be found in the Care Plan section)  Acute Rehab PT Goals Patient Stated Goal: to do what you ask of me PT Goal Formulation: With patient Time For Goal Achievement: 01/14/17 Potential to Achieve Goals: Good    Frequency Min 2X/week   Barriers to discharge        Co-evaluation               AM-PAC PT "6 Clicks" Daily Activity  Outcome Measure Difficulty turning over in bed (including adjusting bedclothes, sheets and blankets)?: None Difficulty moving from lying on back to sitting on the side of the bed? : None Difficulty sitting down on and standing up from a chair with arms (e.g., wheelchair, bedside commode, etc,.)?: A Little Help needed moving  to and from a bed to chair (including a wheelchair)?: A Little Help needed walking in hospital room?: A Little Help needed climbing 3-5 steps with a railing? : A Little 6 Click Score: 20    End of Session Equipment Utilized During Treatment: Gait belt;Oxygen Activity Tolerance: Patient tolerated treatment well Patient left: in chair;with call bell/phone within reach;with chair alarm set Nurse Communication: Mobility status PT Visit Diagnosis: Difficulty in walking, not elsewhere classified (R26.2);Muscle weakness (generalized) (M62.81)    Time: 9628-3662 PT Time Calculation (min) (ACUTE ONLY): 24 min   Charges:   PT Evaluation $PT Eval Moderate Complexity: 1 Mod PT Treatments $Gait Training: 8-22 mins   PT G Codes:   PT G-Codes **NOT FOR INPATIENT CLASS** Functional Assessment Tool Used: AM-PAC 6 Clicks Basic Mobility Functional Limitation: Mobility: Walking and moving around Mobility: Walking and Moving Around Current Status (H4765): At least 20 percent but less than 40 percent impaired, limited or restricted Mobility: Walking and Moving Around Goal Status [phone]): At least 1 percent but less than 20 percent impaired, limited or restricted    Ripley Lovecchio H. Owens Shark, PT, DPT, NCS 12/31/16, 4:05 PM [phone]

## 2016-12-31 NOTE — Progress Notes (Signed)
Inpatient Diabetes Program Recommendations  AACE/ADA: New Consensus Statement on Inpatient Glycemic Control (2015)  Target Ranges:  Prepandial:   less than 140 mg/dL      Peak postprandial:   less than 180 mg/dL (1-2 hours)      Critically ill patients:  140 - 180 mg/dL   Lab Results  Component Value Date   GLUCAP 315 (H) 12/31/2016   HGBA1C 8.4 (H) 11/25/2016    Review of Glycemic ControlResults for WEAVER, TWEED (MRN [id number]) as of 12/31/2016 07:32  Ref. Range 12/30/2016 20:38 12/31/2016 07:15  Glucose-Capillary Latest Ref Range: 65 - 99 mg/dL 253 (H) 315 (H)   Diabetes history: Type 2 diabetes Outpatient Diabetes medications: Levemir 30 units q HS, Humalog 10 units tid with meals Current orders for Inpatient glycemic control:  Novolog sensitive tid with meals  Inpatient Diabetes Program Recommendations:   Referral received. Note history of diabetes.  Please add CHO modified to current diet.  Also consider adding Levemir 20 units q HS, and Novolog meal coverage 5 units tid with meals.    Thanks, Adah Perl, RN, BC-ADM Inpatient Diabetes Coordinator Pager [phone] (8a-5p)

## 2016-12-31 NOTE — Progress Notes (Signed)
MD Pyreddy made aware of new troponin level of 0.53, MD placed orders to dc lovenox and start hep. Gtt. Will continue to monitor

## 2016-12-31 NOTE — Care Management Note (Signed)
Case Management Note  Patient Details  Name: [name] MRN: [id number] Date of Birth: 04/25/1940  Subjective/Objective:       Admitted to [facility] with the diagnosis of atrial fib. Lives with daughter, [name] [phone]) Last seen Dr. Ouida Sills a  Week ago. Prescriptions are filled at [facility]. Chronic home oxygen per [facility]. Uses 6 liters at night. Uses 3 liters during the day at rest. Dialysis at [facility] on [address] x one year. Monday-Wednesday-Friday. No skilled nursing facilities in the past. Followed by [facility] for skilled nursing x 1 year. Takes care of all basic activities of daily living himself. Doesn't drive. Daughter will transport. Palliative Care is in the home.  Possible Thoracotomy 01/01/17  Action/Plan: Will update Elvera Bicker, dialysis representative.    Expected Discharge Date:                  Expected Discharge Plan:     In-House Referral:     Discharge planning Services     Post Acute Care Choice:    Choice offered to:     DME Arranged:    DME Agency:     HH Arranged:    HH Agency:     Status of Service:     If discussed at [employer], dates discussed:    Additional Comments:  Shelbie Ammons, RN MSN CCM Care Management [phone] 12/31/2016, 1:46 PM

## 2017-01-01 ENCOUNTER — Encounter: Payer: Self-pay | Admitting: Anesthesiology

## 2017-01-01 ENCOUNTER — Inpatient Hospital Stay: Payer: Medicare Other

## 2017-01-01 DIAGNOSIS — J90 Pleural effusion, not elsewhere classified: Secondary | ICD-10-CM

## 2017-01-01 LAB — BASIC METABOLIC PANEL
Anion gap: 12 (ref 5–15)
BUN: 59 mg/dL — AB (ref 6–20)
CHLORIDE: 90 mmol/L — AB (ref 101–111)
CO2: 26 mmol/L (ref 22–32)
CREATININE: 6.39 mg/dL — AB (ref 0.61–1.24)
Calcium: 8.1 mg/dL — ABNORMAL LOW (ref 8.9–10.3)
GFR, EST AFRICAN AMERICAN: 9 mL/min — AB (ref >60)
GFR, EST NON AFRICAN AMERICAN: 7 mL/min — AB (ref >60)
Glucose, Bld: 270 mg/dL — ABNORMAL HIGH (ref 65–99)
Potassium: 4 mmol/L (ref 3.5–5.1)
SODIUM: 128 mmol/L — AB (ref 135–145)

## 2017-01-01 LAB — CBC
HCT: 28.6 % — ABNORMAL LOW (ref 40.0–52.0)
HEMOGLOBIN: 9.8 g/dL — AB (ref 13.0–18.0)
MCH: 29.6 pg (ref 26.0–34.0)
MCHC: 34.1 g/dL (ref 32.0–36.0)
MCV: 86.8 fL (ref 80.0–100.0)
PLATELETS: 118 10*3/uL — AB (ref 150–440)
RBC: 3.3 MIL/uL — AB (ref 4.40–5.90)
RDW: 15.5 % — ABNORMAL HIGH (ref 11.5–14.5)
WBC: 8.4 10*3/uL (ref 3.8–10.6)

## 2017-01-01 LAB — HEPARIN LEVEL (UNFRACTIONATED)
HEPARIN UNFRACTIONATED: 0.34 [IU]/mL (ref 0.30–0.70)
Heparin Unfractionated: 0.32 IU/mL (ref 0.30–0.70)

## 2017-01-01 LAB — GLUCOSE, CAPILLARY
GLUCOSE-CAPILLARY: 268 mg/dL — AB (ref 65–99)
GLUCOSE-CAPILLARY: 153 mg/dL — AB (ref 65–99)
GLUCOSE-CAPILLARY: 243 mg/dL — AB (ref 65–99)
Glucose-Capillary: 239 mg/dL — ABNORMAL HIGH (ref 65–99)

## 2017-01-01 MED ORDER — METHYLPREDNISOLONE SODIUM SUCC 40 MG IJ SOLR
40.0000 mg | Freq: Every day | INTRAMUSCULAR | Status: DC
  Administered 2017-01-01: 40 mg via INTRAVENOUS
  Filled 2017-01-01 (×2): qty 1

## 2017-01-01 MED ORDER — IPRATROPIUM-ALBUTEROL 0.5-2.5 (3) MG/3ML IN SOLN
3.0000 mL | Freq: Four times a day (QID) | RESPIRATORY_TRACT | Status: DC
  Administered 2017-01-01 – 2017-01-07 (×20): 3 mL via RESPIRATORY_TRACT
  Filled 2017-01-01 (×21): qty 3

## 2017-01-01 MED ORDER — IPRATROPIUM-ALBUTEROL 0.5-2.5 (3) MG/3ML IN SOLN
RESPIRATORY_TRACT | Status: AC
  Administered 2017-01-01: 3 mL
  Filled 2017-01-01: qty 3

## 2017-01-01 MED ORDER — HEPARIN BOLUS VIA INFUSION
1300.0000 [IU] | Freq: Once | INTRAVENOUS | Status: AC
  Administered 2017-01-01: 1300 [IU] via INTRAVENOUS
  Filled 2017-01-01: qty 1300

## 2017-01-01 MED ORDER — LIDOCAINE-PRILOCAINE 2.5-2.5 % EX CREA
TOPICAL_CREAM | CUTANEOUS | Status: DC | PRN
  Administered 2017-01-02 (×2): via TOPICAL
  Filled 2017-01-01: qty 5

## 2017-01-01 MED ORDER — AMIODARONE HCL 200 MG PO TABS
400.0000 mg | ORAL_TABLET | Freq: Every day | ORAL | Status: DC
  Administered 2017-01-01 – 2017-01-16 (×15): 400 mg via ORAL
  Filled 2017-01-01 (×16): qty 2

## 2017-01-01 MED ORDER — BUDESONIDE 0.5 MG/2ML IN SUSP
0.5000 mg | Freq: Two times a day (BID) | RESPIRATORY_TRACT | Status: DC
  Administered 2017-01-01 – 2017-01-16 (×29): 0.5 mg via RESPIRATORY_TRACT
  Filled 2017-01-01 (×31): qty 2

## 2017-01-01 NOTE — Progress Notes (Signed)
PT Cancellation Note  Patient Details Name: Calvin Good MRN: [id number] DOB: 08-Mar-1941   Cancelled Treatment:    Reason Eval/Treat Not Completed: Patient at procedure or test/unavailable (Patient currently off unit at dialysis and unavailable for treatment at this time.  Will continue efforts next date as patient medically appropriate and available.)   Kyree Adriano H. Owens Shark, PT, DPT, NCS 01/01/17, 3:31 PM [phone]

## 2017-01-01 NOTE — Progress Notes (Signed)
[facility], [place] 01/01/17  Subjective:   Patient known to our practice from outpatient dialysis He was sent from dialysis for chest pain He was noted to be in atrial fibrillation with RVR with heart rate of greater than 130 This morning, he states that he does not have any further chest pain Feels well  Objective:  Vital signs in last 24 hours:  Temp:  [97.9 F (36.6 C)-98.4 F (36.9 C)] 98 F (36.7 C) (10/03 1545) Pulse Rate:  [64-71] 71 (10/03 1545) Resp:  [16-24] 22 (10/03 1545) BP: (129-171)/(33-76) 171/50 (10/03 1545) SpO2:  [94 %-99 %] 95 % (10/03 1545) Weight:  [94 kg (207 lb 4.8 oz)] 94 kg (207 lb 4.8 oz) (10/03 0402)  Weight change: -3.493 kg (-7 lb 11.2 oz) Filed Weights   12/30/16 1946 12/31/16 0337 01/01/17 0402  Weight: 94.3 kg (207 lb 12.8 oz) 93.9 kg (207 lb) 94 kg (207 lb 4.8 oz)    Intake/Output:    Intake/Output Summary (Last 24 hours) at 01/01/17 1605 Last data filed at 01/01/17 1530  Gross per 24 hour  Intake              960 ml  Output             -300 ml  Net             1260 ml     Physical Exam: General: Chronically ill-appearing, lying in the bed  HEENT Eyes are closed  Neck supple  Pulm/lungs shallow breathing effort, decreased breath sounds at bases  CVS/Heart Irregular  Abdomen:  Soft, nontender  Extremities: No edema  Neurologic: Somnolent; able to answer quesions  Skin: No acute rashes  access Left arm AV fistula       Basic Metabolic Panel:   Recent Labs Lab 12/30/16 1624 12/31/16 0151 01/01/17 0914  NA 136 133* 128*  K 3.4* 3.9 4.0  CL 95* 94* 90*  CO2 29 28 26   GLUCOSE 221* 403* 270*  BUN 36* 44* 59*  CREATININE 3.62* 4.52* 6.39*  CALCIUM 8.6* 7.9* 8.1*     CBC:  Recent Labs Lab 12/30/16 1624 12/31/16 0151 01/01/17 0914  WBC 10.3 9.8 8.4  HGB 10.6* 9.6* 9.8*  HCT 30.5* 27.9* 28.6*  MCV 85.2 86.3 86.8  PLT 167 143* 118*      Lab Results  Component Value Date   HEPBSAG Negative 11/27/2016      Microbiology:  Recent Results (from the past 240 hour(s))  Blood Culture (routine x 2)     Status: None (Preliminary result)   Collection Time: 12/30/16  4:28 PM  Result Value Ref Range Status   Specimen Description BLOOD RIGHT ANTECUBITAL  Final   Special Requests   Final    BOTTLES DRAWN AEROBIC AND ANAEROBIC Blood Culture adequate volume   Culture NO GROWTH 2 DAYS  Final   Report Status PENDING  Incomplete  Blood Culture (routine x 2)     Status: None (Preliminary result)   Collection Time: 12/30/16  4:33 PM  Result Value Ref Range Status   Specimen Description BLOOD BLOOD RIGHT HAND  Final   Special Requests   Final    BOTTLES DRAWN AEROBIC AND ANAEROBIC Blood Culture adequate volume   Culture NO GROWTH 2 DAYS  Final   Report Status PENDING  Incomplete  MRSA PCR Screening     Status: None   Collection Time: 12/31/16 12:50 AM  Result Value Ref Range Status  MRSA by PCR NEGATIVE NEGATIVE Final    Comment:        The GeneXpert MRSA Assay (FDA approved for NASAL specimens only), is one component of a comprehensive MRSA colonization surveillance program. It is not intended to diagnose MRSA infection nor to guide or monitor treatment for MRSA infections.     Coagulation Studies:  Recent Labs  12/31/16 0532  LABPROT 14.2  INR 1.11    Urinalysis: No results for input(s): COLORURINE, LABSPEC, PHURINE, GLUCOSEU, HGBUR, BILIRUBINUR, KETONESUR, PROTEINUR, UROBILINOGEN, NITRITE, LEUKOCYTESUR in the last 72 hours.  Invalid input(s): APPERANCEUR    Imaging: Dg Chest Portable 1 View  Result Date: 12/30/2016 CLINICAL DATA:  Chest pain and shortness of breath. Uncontrolled atrial fibrillation. Recent dialysis. EXAM: PORTABLE CHEST 1 VIEW COMPARISON:  12/05/2016 FINDINGS: Stable mild cardiomegaly.  Aortic atherosclerosis. Increased size of small right pleural effusion. No evidence of pulmonary consolidation or frank pulmonary edema.  IMPRESSION: Increased size of small right pleural effusion. Stable mild cardiomegaly. Electronically Signed   By: Earle Gell M.D.   On: 12/30/2016 16:53     Medications:   . sodium chloride    . heparin 1,350 Units/hr (01/01/17 0017)   . amiodarone  400 mg Oral Daily  . aspirin EC  81 mg Oral Daily  . atorvastatin  20 mg Oral QHS  . budesonide (PULMICORT) nebulizer solution  0.5 mg Nebulization BID  . calcium acetate  667 mg Oral TID WC  . carvedilol  6.25 mg Oral BID  . doxazosin  8 mg Oral QPM  . furosemide  40 mg Oral BID  . insulin aspart  0-9 Units Subcutaneous TID WC  . insulin detemir  15 Units Subcutaneous QHS  . ipratropium-albuterol  3 mL Nebulization Q6H  . isosorbide mononitrate  60 mg Oral Daily  . lisinopril  20 mg Oral QPM  . pantoprazole  40 mg Oral Daily  . sodium chloride flush  3 mL Intravenous Q12H  . spironolactone  25 mg Oral BID   sodium chloride, acetaminophen **OR** acetaminophen, fentaNYL (SUBLIMAZE) injection, guaiFENesin-dextromethorphan, HYDROcodone-acetaminophen, senna-docusate, sodium chloride flush, traMADol  Assessment/ Plan:  76 y.o. male with diabetes mellitus type II insulin dependent, hypertension, coronary artery disease, hyperlipidemia, prostate cancer status post prostatectomy, right total knee, appendectomy.   MWF [facility] [place] AVF  1. End Stage Renal Disease MWF: Patient became uncomfortable during dialysis and cut his treatment short after 1.5 hours - we'll schedule extra dialysis for tomorrow. Patient prefers to dialyze in a chair  2. Anemia of chronic kidney disease/Acute GI bleed 11/26/16.  Hemoglobin 9.8 Continue Procrit with dialysis  3. 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  - Management as per hospitalist.    4. Secondary Hyperparathyroidism:  - monitor phosphorus during hospitalization  5. A Fib with RVR:  It is a recurrent problem Cardiology evaluation ongoing    LOS: 2 [facility] 10/3/20184:05 PM  [place], [place]

## 2017-01-01 NOTE — Progress Notes (Signed)
ANTICOAGULATION CONSULT NOTE -  Follow UP   Pharmacy Consult for heparin drip Indication: chest pain/ACS/elevated troponin  Allergies  Allergen Reactions  . Tetracyclines & Related Other (See Comments)    Reaction: Unknown     Patient Measurements: Height: 5\' 8"  (172.7 cm) Weight: 207 lb 4.8 oz (94 kg) IBW/kg (Calculated) : 68.4 Heparin Dosing Weight: 88 kg  Vital Signs: Temp: 98.4 F (36.9 C) (10/03 0952) Temp Source: Oral (10/03 0952) BP: 134/43 (10/03 0952) Pulse Rate: 64 (10/03 0952)  Labs:  Recent Labs  12/30/16 1624 12/30/16 2036 12/31/16 0151 12/31/16 0532 12/31/16 0941 12/31/16 1350 12/31/16 2242 01/01/17 0914  HGB 10.6*  --  9.6*  --   --   --   --  9.8*  HCT 30.5*  --  27.9*  --   --   --   --  28.6*  PLT 167  --  143*  --   --   --   --  118*  APTT  --   --   --  39*  --   --   --   --   LABPROT  --   --   --  14.2  --   --   --   --   INR  --   --   --  1.11  --   --   --   --   HEPARINUNFRC  --   --   --   --   --  0.34 0.22* 0.32  CREATININE 3.62*  --  4.52*  --   --   --   --  6.39*  TROPONINI <0.03 0.11* 0.53*  --  0.56*  --   --   --     Estimated Creatinine Clearance: 10.6 mL/min (A) (by C-G formula based on SCr of 6.39 mg/dL (H)).   Medical History: Past Medical History:  Diagnosis Date  . Cancer [facility])    Prostate  . CHF (congestive heart failure) ([place])   . Chronic kidney disease   . Complication of anesthesia    hard to wake up  . COPD (chronic obstructive pulmonary disease) ([place])   . Diabetes mellitus without complication ([place])   . Difficult intubation   . Dyspnea   . GERD (gastroesophageal reflux disease)   . Hyperlipidemia   . Hypertension   . Pleural effusion   . Renal insufficiency     Medications:  No anticoagulation in PTA meds  Assessment:  Goal of Therapy:  Heparin level 0.3-0.7 units/ml Monitor platelets by anticoagulation protocol: Yes   Plan:  Heparin level resulted @ 0.34. Will continue current rate of  heparin. Will recheck heparin level @ 2200  Avaree Gilberti D 01/01/2017,11:04 AM     10/02 2300 heparin level 0.22. 1300 unit bolus and increase rate to 1350 units/hr. Recheck in 8 hours. 10/03 Heparin level resulted @ 0.32. Will continue current heparin gtt rate and will recheck heparin level  In 8 hours.   Larene Beach, PharmD  01/01/17 11:04 AM

## 2017-01-01 NOTE — Progress Notes (Signed)
Pt returned from dialysis feeling SOB and coughing up a moderate amount of thick sputum. Respiratory therapy called for nebulizer treatment. BP elevated, BP medications administered. Helped pt to chair, he states he is feeling a little better now.

## 2017-01-01 NOTE — Progress Notes (Signed)
ANTICOAGULATION CONSULT NOTE -  Follow UP   Pharmacy Consult for heparin drip Indication: chest pain/ACS/elevated troponin  Allergies  Allergen Reactions  . Tetracyclines & Related Other (See Comments)    Reaction: Unknown     Patient Measurements: Height: 5\' 8"  (172.7 cm) Weight: 207 lb 4.8 oz (94 kg) IBW/kg (Calculated) : 68.4 Heparin Dosing Weight: 88 kg  Vital Signs: Temp: 98.1 F (36.7 C) (10/03 1947) Temp Source: Oral (10/03 1545) BP: 156/53 (10/03 1947) Pulse Rate: 67 (10/03 1947)  Labs:  Recent Labs  12/30/16 1624 12/30/16 2036 12/31/16 0151 12/31/16 0532 12/31/16 0941  12/31/16 2242 01/01/17 0914 01/01/17 1859  HGB 10.6*  --  9.6*  --   --   --   --  9.8*  --   HCT 30.5*  --  27.9*  --   --   --   --  28.6*  --   PLT 167  --  143*  --   --   --   --  118*  --   APTT  --   --   --  39*  --   --   --   --   --   LABPROT  --   --   --  14.2  --   --   --   --   --   INR  --   --   --  1.11  --   --   --   --   --   HEPARINUNFRC  --   --   --   --   --   < > 0.22* 0.32 0.34  CREATININE 3.62*  --  4.52*  --   --   --   --  6.39*  --   TROPONINI <0.03 0.11* 0.53*  --  0.56*  --   --   --   --   < > = values in this interval not displayed.  Estimated Creatinine Clearance: 10.6 mL/min (A) (by C-G formula based on SCr of 6.39 mg/dL (H)).   Medical History: Past Medical History:  Diagnosis Date  . Cancer [facility])    Prostate  . CHF (congestive heart failure) ([place])   . Chronic kidney disease   . Complication of anesthesia    hard to wake up  . COPD (chronic obstructive pulmonary disease) ([place])   . Diabetes mellitus without complication ([place])   . Difficult intubation   . Dyspnea   . GERD (gastroesophageal reflux disease)   . Hyperlipidemia   . Hypertension   . Pleural effusion   . Renal insufficiency     Medications:  No anticoagulation in PTA meds  Assessment:  Goal of Therapy:  Heparin level 0.3-0.7 units/ml Monitor platelets by  anticoagulation protocol: Yes   Plan:  Heparin level resulted @ 0.34. Will continue current rate of heparin. Will recheck heparin level @ [place] 01/01/2017,8:05 PM   10/02 2300 heparin level 0.22. 1300 unit bolus and increase rate to 1350 units/hr. Recheck in 8 hours.  10/03 Heparin level resulted @ 0.32. Will continue current heparin gtt rate and will recheck heparin level  In 8 hours.   10/03 1859 HL therapeutic x 2. Continue current rate. Pharmacy will continue to monitor daily HL and CBC.  Josely Moffat A. Jordan Hawks, PharmD, BCPS Clinical Pharmacist 01/01/17 8:05 PM

## 2017-01-01 NOTE — Progress Notes (Addendum)
Patient ID: Calvin Good, male   DOB: 08-17-1940, 76 y.o.   MRN: [id number]  [employer] PROGRESS NOTE  Calvin Good CWC:[id number] DOB: [birth year] DOA: 12/30/2016 PCP: [name], MD  HPI/Subjective: Patient with a little cough canal shortness of breath. Feels a little bit better.  Objective: Vitals:   01/01/17 1500 01/01/17 1515  BP: (!) 155/51 (!) 160/53  Pulse: 69 68  Resp: 16 (!) 24  Temp:    SpO2: 96% 96%    Filed Weights   12/30/16 1946 12/31/16 0337 01/01/17 0402  Weight: 94.3 kg (207 lb 12.8 oz) 93.9 kg (207 lb) 94 kg (207 lb 4.8 oz)    ROS: Review of Systems  Constitutional: Negative for chills and fever.  Eyes: Negative for blurred vision.  Respiratory: Positive for cough and shortness of breath.   Cardiovascular: Negative for chest pain.  Gastrointestinal: Positive for nausea. Negative for abdominal pain, constipation, diarrhea and vomiting.  Genitourinary: Negative for dysuria.  Musculoskeletal: Negative for joint pain.  Neurological: Positive for weakness. Negative for dizziness and headaches.   Exam: Physical Exam  Constitutional: He is oriented to person, place, and time.  HENT:  Nose: No mucosal edema.  Mouth/Throat: No oropharyngeal exudate or posterior oropharyngeal edema.  Eyes: Pupils are equal, round, and reactive to light. Conjunctivae, EOM and lids are normal.  Neck: No JVD present. Carotid bruit is not present. No edema present. No thyroid mass and no thyromegaly present.  Cardiovascular: S1 normal and S2 normal.  Exam reveals no gallop.   Murmur heard.  Systolic murmur is present with a grade of 2/6  Pulses:      Dorsalis pedis pulses are 2+ on the right side, and 2+ on the left side.  Respiratory: No respiratory distress. He has decreased breath sounds in the right lower field and the left lower field. He has wheezes in the right upper field and the left upper field. He has rhonchi in the right lower field and the left lower  field. He has no rales.  GI: Soft. Bowel sounds are normal. There is no tenderness.  Musculoskeletal:       Right ankle: He exhibits swelling.       Left ankle: He exhibits swelling.  Lymphadenopathy:    He has no cervical adenopathy.  Neurological: He is alert and oriented to person, place, and time. No cranial nerve deficit.  Skin: Skin is warm. No rash noted. Nails show no clubbing.  Psychiatric: He has a normal mood and affect.      Data Reviewed: Basic Metabolic Panel:  Recent Labs Lab 12/30/16 1624 12/31/16 0151 01/01/17 0914  NA 136 133* 128*  K 3.4* 3.9 4.0  CL 95* 94* 90*  CO2 29 28 26   GLUCOSE 221* 403* 270*  BUN 36* 44* 59*  CREATININE 3.62* 4.52* 6.39*  CALCIUM 8.6* 7.9* 8.1*   CBC:  Recent Labs Lab 12/30/16 1624 12/31/16 0151 01/01/17 0914  WBC 10.3 9.8 8.4  HGB 10.6* 9.6* 9.8*  HCT 30.5* 27.9* 28.6*  MCV 85.2 86.3 86.8  PLT 167 143* 118*   Cardiac Enzymes:  Recent Labs Lab 12/30/16 1624 12/30/16 2036 12/31/16 0151 12/31/16 0941  TROPONINI <0.03 0.11* 0.53* 0.56*   BNP (last 3 results)  Recent Labs  09/14/16 1249 10/16/16 1942  BNP 639.0* 900.0*     CBG:  Recent Labs Lab 12/31/16 1119 12/31/16 1645 12/31/16 2100 01/01/17 0923 01/01/17 1143  GLUCAP 321* 260* 309* 268* 239*    Recent Results (from  the past 240 hour(s))  Blood Culture (routine x 2)     Status: None (Preliminary result)   Collection Time: 12/30/16  4:28 PM  Result Value Ref Range Status   Specimen Description BLOOD RIGHT ANTECUBITAL  Final   Special Requests   Final    BOTTLES DRAWN AEROBIC AND ANAEROBIC Blood Culture adequate volume   Culture NO GROWTH 2 DAYS  Final   Report Status PENDING  Incomplete  Blood Culture (routine x 2)     Status: None (Preliminary result)   Collection Time: 12/30/16  4:33 PM  Result Value Ref Range Status   Specimen Description BLOOD BLOOD RIGHT HAND  Final   Special Requests   Final    BOTTLES DRAWN AEROBIC AND ANAEROBIC  Blood Culture adequate volume   Culture NO GROWTH 2 DAYS  Final   Report Status PENDING  Incomplete  MRSA PCR Screening     Status: None   Collection Time: 12/31/16 12:50 AM  Result Value Ref Range Status   MRSA by PCR NEGATIVE NEGATIVE Final    Comment:        The GeneXpert MRSA Assay (FDA approved for NASAL specimens only), is one component of a comprehensive MRSA colonization surveillance program. It is not intended to diagnose MRSA infection nor to guide or monitor treatment for MRSA infections.      Studies: Dg Chest Portable 1 View  Result Date: 12/30/2016 CLINICAL DATA:  Chest pain and shortness of breath. Uncontrolled atrial fibrillation. Recent dialysis. EXAM: PORTABLE CHEST 1 VIEW COMPARISON:  12/05/2016 FINDINGS: Stable mild cardiomegaly.  Aortic atherosclerosis. Increased size of small right pleural effusion. No evidence of pulmonary consolidation or frank pulmonary edema. IMPRESSION: Increased size of small right pleural effusion. Stable mild cardiomegaly. Electronically Signed   By: Earle Gell M.D.   On: 12/30/2016 16:53    Scheduled Meds: . amiodarone  400 mg Oral Daily  . aspirin EC  81 mg Oral Daily  . atorvastatin  20 mg Oral QHS  . budesonide (PULMICORT) nebulizer solution  0.5 mg Nebulization BID  . calcium acetate  667 mg Oral TID WC  . carvedilol  6.25 mg Oral BID  . doxazosin  8 mg Oral QPM  . furosemide  40 mg Oral BID  . insulin aspart  0-9 Units Subcutaneous TID WC  . insulin detemir  15 Units Subcutaneous QHS  . ipratropium-albuterol  3 mL Nebulization Q6H  . isosorbide mononitrate  60 mg Oral Daily  . lisinopril  20 mg Oral QPM  . pantoprazole  40 mg Oral Daily  . sodium chloride flush  3 mL Intravenous Q12H  . spironolactone  25 mg Oral BID   Continuous Infusions: . sodium chloride    . heparin 1,350 Units/hr (01/01/17 0017)    Assessment/Plan:  1. Atrial fibrillation with rapid ventricular response.  Amiodarone drip converted over to  oral amiodarone.  Continue oral Coreg. [place] cardiology consultation. Patient on heparin drip for anticoagulation at this point. 2. Acute diastolic congestive heart failure. Continue Coreg, Lasix, lisinopril. Dialysis also to help manage fluid. 3. Moderate right pleural effusion. Case discussed with Dr. Genevive Bi cardiothoracic surgery and he was unable to schedule his right thoracotomy and talc pleurodesis until Monday. Prior left pleural effusion was seen on previous CAT scan. Chest x-ray on admission did not show. 4. Wheeze. Start nebulizer treatments. Repeat chest x-ray tomorrow. 5. End-stage renal disease on hemodialysis.  Hemodialysis today. 6. Weakness. Physical therapy evaluation 7. 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  Type 2 diabetes mellitus on sliding scale. 9. 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 Close follow-up will be needed on this.  Code Status:     Code Status Orders        Start     Ordered   12/30/16 1958  Full code  Continuous     12/30/16 1957    Code Status History    Date Active Date Inactive Code Status Order ID Comments User Context   11/25/2016  5:59 AM 11/27/2016  8:08 PM Full Code [id number]  Harrie Foreman, MD Inpatient   10/31/2016  1:48 AM 10/31/2016  7:29 PM Full Code [id number]  Saundra Shelling, MD Inpatient   10/16/2016 11:41 PM 10/21/2016 12:21 AM Full Code [id number]  Vaughan Basta, MD ED   09/14/2016  6:31 PM 09/24/2016  2:43 AM Full Code [id number]  Nicholes Mango, MD Inpatient   08/26/2016  6:45 AM 08/28/2016  8:11 PM Full Code [id number]  Saundra Shelling, MD Inpatient   08/20/2016  3:05 AM 08/22/2016  1:01 PM Full Code [id number]  Lance Coon, MD ED    Advance Directive Documentation     Most Recent Value  Type of Advance Directive  Healthcare Power of Attorney  Pre-existing out of facility DNR order (yellow form or pink MOST form)  -  "MOST" Form in Place?  -     Family Communication: Spoke with the daughter on the phone Disposition Plan: To  be determined  Consultants:  Cardiology  Cardiothoracic surgery  Time spent: 26 minutes  [place], [place]

## 2017-01-01 NOTE — Anesthesia Preprocedure Evaluation (Signed)
Anesthesia Evaluation  Patient identified by MRN, date of birth, ID band Patient awake    Reviewed: Allergy & Precautions, H&P , NPO status , Patient's Chart, lab work & pertinent test results, reviewed documented beta blocker date and time   History of Anesthesia Complications (+) DIFFICULT AIRWAY and history of anesthetic complications  Airway Mallampati: II  TM Distance: >3 FB Neck ROM: full    Dental no notable dental hx. (+) Teeth Intact   Pulmonary neg pulmonary ROS, shortness of breath and with exertion, sleep apnea , COPD, former smoker,  Recurrent R-sided pleural effusion   Pulmonary exam normal breath sounds clear to auscultation       Cardiovascular Exercise Tolerance: Good hypertension, + Past MI (NSTEMI) and +CHF  negative cardio ROS   Rhythm:regular Rate:Normal     Neuro/Psych negative neurological ROS  negative psych ROS   GI/Hepatic negative GI ROS, Neg liver ROS, GERD  Medicated,  Endo/Other  negative endocrine ROSdiabetes  Renal/GU ESRFRenal disease     Musculoskeletal   Abdominal   Peds  Hematology negative hematology ROS (+)   Anesthesia Other Findings Past Medical History: No date: Cancer ([place])     Comment:  Prostate No date: CHF (congestive heart failure) ([facility]) No date: Chronic kidney disease No date: Complication of anesthesia     Comment:  hard to wake up No date: COPD (chronic obstructive pulmonary disease) ([facility]) No date: Diabetes mellitus without complication ([facility]) No date: Difficult intubation No date: Dyspnea No date: GERD (gastroesophageal reflux disease) No date: Hyperlipidemia No date: Hypertension No date: Pleural effusion No date: Renal insufficiency  Patient seen on 10-3 at the request of Dr. Genevive Bi.  Patient is overall of similar health to his prior surgeries; however, now with recurrence of a right pleural effusion that is causing him significant SOB.  He is improved in  comparison to when he arrived at the hospital.  Plan to optimize with ultrasound-guided thoracentesis prior to thoracoscopy and pleurodesis.  Otherwise his health is stable at this time.  Reproductive/Obstetrics negative OB ROS                            Anesthesia Physical  Anesthesia Plan  ASA: II  Anesthesia Plan: General   Post-op Pain Management:    Induction: Intravenous  PONV Risk Score and Plan: 2 and Ondansetron and Dexamethasone  Airway Management Planned: Double Lumen EBT  Additional Equipment:   Intra-op Plan:   Post-operative Plan: Extubation in OR  Informed Consent: I have reviewed the patients History and Physical, chart, labs and discussed the procedure including the risks, benefits and alternatives for the proposed anesthesia with the patient or authorized representative who has indicated his/her understanding and acceptance.     Plan Discussed with: CRNA  Anesthesia Plan Comments:        Anesthesia Quick Evaluation

## 2017-01-01 NOTE — Procedures (Signed)
Pt arrived to HDU via hospital transport staff at or about 1350 and was assessed prior to tx and found to have a LUA AVF, which was prepared per p&p and cannulated w 15 ga needles w/o issue; order includes RTD 3.5 hours on 3251 bath, BFR 400, DFR 800 and attempt to achieve 1 kg fluid removal as tolerated by pt; pt initiated at 1410 w/o issue; pt completed 1 hour and 15 minutes of his ordered tx and received 300 mL of fluid overall, all blood was rinsed back and needles aseptically de-cannulated and pressure dressings applied; hemostasis achieved w/o prolonged bleeding and pt was stable at the end of his tx and when leaving HDU under the care of transport staff; report called to primary RN

## 2017-01-01 NOTE — Progress Notes (Signed)
[facility] Cardiology [facility] Encounter Note  Patient: Calvin Good / Admit Date: 12/30/2016 / Date of Encounter: 01/01/2017, 1:20 PM   Subjective: Patient overall feeling much better at this time with less shortness of breath and no further episodes of chest discomfort. Troponin level peaked at 0.1 consistent with demand ischemia rather than acute coronary syndrome. Patient says converted to normal sinus rhythm with intravenous amiodarone  Review of Systems: Positive for: Shortness of breath cough congestion Negative for: Vision change, hearing change, syncope, dizziness, nausea, vomiting,diarrhea, bloody stool, stomach pain, positive for cough, congestion, negative for diaphoresis, urinary frequency, urinary pain,skin lesions, skin rashes Others previously listed  Objective: Telemetry: Normal sinus rhythm Physical Exam: Blood pressure (!) 146/41, pulse 66, temperature 97.9 F (36.6 C), resp. rate 20, height 5\' 8"  (1.727 m), weight 94 kg (207 lb 4.8 oz), SpO2 99 %. Body mass index is 31.52 kg/m. General: Well developed, well nourished, in no acute distress. Head: Normocephalic, atraumatic, sclera non-icteric, no xanthomas, nares are without discharge. Neck: No apparent masses Lungs: Normal respirations with some wheezes, no rhonchi, no rales , no crackles   Heart: Regular rate and rhythm, normal S1 S2, no murmur, no rub, no gallop, PMI is normal size and placement, carotid upstroke normal without bruit, jugular venous pressure normal Abdomen: Soft, non-tender, non-distended with normoactive bowel sounds. No hepatosplenomegaly. Abdominal aorta is normal size without bruit Extremities: 1-2+ edema, no clubbing, no cyanosis, no ulcers,  Peripheral: 2+ radial, 2+ femoral, 2+ dorsal pedal pulses Neuro: Alert and oriented. Moves all extremities spontaneously. Psych:  Responds to questions appropriately with a normal affect.   Intake/Output Summary (Last 24 hours) at 01/01/17 1320 Last  data filed at 01/01/17 1028  Gross per 24 hour  Intake            832.8 ml  Output                0 ml  Net            832.8 ml    Inpatient Medications:  . aspirin EC  81 mg Oral Daily  . atorvastatin  20 mg Oral QHS  . budesonide (PULMICORT) nebulizer solution  0.5 mg Nebulization BID  . calcium acetate  667 mg Oral TID WC  . carvedilol  6.25 mg Oral BID  . doxazosin  8 mg Oral QPM  . furosemide  40 mg Oral BID  . insulin aspart  0-9 Units Subcutaneous TID WC  . insulin detemir  15 Units Subcutaneous QHS  . ipratropium-albuterol  3 mL Nebulization Q6H  . isosorbide mononitrate  60 mg Oral Daily  . lisinopril  20 mg Oral QPM  . pantoprazole  40 mg Oral Daily  . sodium chloride flush  3 mL Intravenous Q12H  . spironolactone  25 mg Oral BID   Infusions:  . sodium chloride    . heparin 1,350 Units/hr (01/01/17 0017)    Labs:  Recent Labs  12/31/16 0151 01/01/17 0914  NA 133* 128*  K 3.9 4.0  CL 94* 90*  CO2 28 26  GLUCOSE 403* 270*  BUN 44* 59*  CREATININE 4.52* 6.39*  CALCIUM 7.9* 8.1*   No results for input(s): AST, ALT, ALKPHOS, BILITOT, PROT, ALBUMIN in the last 72 hours.  Recent Labs  12/31/16 0151 01/01/17 0914  WBC 9.8 8.4  HGB 9.6* 9.8*  HCT 27.9* 28.6*  MCV 86.3 86.8  PLT 143* 118*    Recent Labs  12/30/16 1624 12/30/16 2036 12/31/16 0151  12/31/16 0941  TROPONINI <0.03 0.11* 0.53* 0.56*   Invalid input(s): POCBNP No results for input(s): HGBA1C in the last 72 hours.   Weights: Filed Weights   12/30/16 1946 12/31/16 0337 01/01/17 0402  Weight: 94.3 kg (207 lb 12.8 oz) 93.9 kg (207 lb) 94 kg (207 lb 4.8 oz)     Radiology/Studies:  Dg Chest Portable 1 View  Result Date: 12/30/2016 CLINICAL DATA:  Chest pain and shortness of breath. Uncontrolled atrial fibrillation. Recent dialysis. EXAM: PORTABLE CHEST 1 VIEW COMPARISON:  12/05/2016 FINDINGS: Stable mild cardiomegaly.  Aortic atherosclerosis. Increased size of small right pleural  effusion. No evidence of pulmonary consolidation or frank pulmonary edema. IMPRESSION: Increased size of small right pleural effusion. Stable mild cardiomegaly. Electronically Signed   By: Earle Gell M.D.   On: 12/30/2016 16:53   Dg Chest Port 1 View  Result Date: 12/05/2016 CLINICAL DATA:  Right pleural effusion, status post thoracentesis EXAM: PORTABLE CHEST 1 VIEW COMPARISON:  12/04/2016 FINDINGS: Small residual right pleural effusion following thoracentesis. No pneumothorax. Minor right base atelectasis. Normal heart size and vascularity. Left lung remains clear. Atherosclerosis of the aorta. Trachea is midline IMPRESSION: Residual small right pleural effusion following thoracentesis. Negative for pneumothorax. Stable exam. Electronically Signed   By: Jerilynn Mages.  Shick M.D.   On: 12/05/2016 09:44   Dg Chest Portable 1 View  Result Date: 12/04/2016 CLINICAL DATA:  Weakness and fatigue after dialysis today. History of atrial fibrillation, metastatic prostate cancer. EXAM: PORTABLE CHEST 1 VIEW COMPARISON:  Chest radiograph November 25, 2016 and PET-CT October 22, 2016. FINDINGS: Stable cardiomegaly. Calcified aortic knob. Moderate RIGHT pleural effusion and underlying consolidation persists. LEFT lung is clear. Mild pulmonary vascular congestion. No pneumothorax. Soft tissue planes and included osseous structures are unchanged. IMPRESSION: Similar to increased moderate RIGHT pleural effusion with underlying consolidation. Mild cardiomegaly and pulmonary vascular congestion. Electronically Signed   By: Elon Alas M.D.   On: 12/04/2016 21:32   US Thoracentesis Asp Pleural Space W/img Guide  Result Date: 12/05/2016 INDICATION: CHF, end-stage renal disease, recurrent right pleural effusion, shortness of breath EXAM: ULTRASOUND GUIDED RIGHT THORACENTESIS MEDICATIONS: 1% lidocaine local COMPLICATIONS: None immediate. PROCEDURE: An ultrasound guided thoracentesis was thoroughly discussed with the patient and  questions answered. The benefits, risks, alternatives and complications were also discussed. The patient understands and wishes to proceed with the procedure. Written consent was obtained. Ultrasound was performed to localize and mark an adequate pocket of fluid in the right chest. The area was then prepped and draped in the normal sterile fashion. 1% Lidocaine was used for local anesthesia. Under ultrasound guidance a 6 Fr Safe-T-Centesis catheter was introduced. Thoracentesis was performed. The catheter was removed and a dressing applied. FINDINGS: A total of approximately 1.2 L of clear pleural fluid was removed. Samples were sent to the laboratory as requested by the clinical team. IMPRESSION: Successful ultrasound guided right thoracentesis yielding 1.2 L of pleural fluid. Electronically Signed   By: Jerilynn Mages.  Shick M.D.   On: 12/05/2016 09:18     Assessment and Recommendation  76 y.o. 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 Continue amiodarone but changed drip to oral amiodarone 400 mg each day 2. Anticoagulation when able for further risk reduction in stroke with atrial fibrillation watching closely for  significant bleeding and anemia 3. Continuation of hypertension control and heart rate control with carvedilol 4. ACE inhibitor for hypertension control 5. Further cardiac diagnostics necessary at this time 6. Proceed to dialysis without restriction  Signed, Serafina Royals M.D. FACC

## 2017-01-01 NOTE — Progress Notes (Signed)
Per report from Ames Dura, primary nurse for this patient, he is currently on an amiodarone gtt which is expected to be d/c by cardiologist and we are only awaiting the rounding of said physician before this patient can be transported to the dialysis unit.

## 2017-01-01 NOTE — Care Management (Signed)
I have asked MD to add home health PT to discharge plan. Patient is already followed by [facility]. Please add cardiac rehab to home health order as Nanine Means can provide that.

## 2017-01-01 NOTE — Progress Notes (Signed)
Initial Nutrition Assessment  DOCUMENTATION CODES:   Obesity unspecified  INTERVENTION:   1. Recommended Ensure Enlive po BID, each supplement provides 350 kcal and 20 grams of protein  NUTRITION DIAGNOSIS:   Inadequate oral intake related to poor appetite, acute illness as evidenced by per patient/family report, percent weight loss.  GOAL:   Patient will meet greater than or equal to 90% of their needs  MONITOR:   PO intake, Labs, I & O's, Weight trends, Supplement acceptance  REASON FOR ASSESSMENT:   Malnutrition Screening Tool    ASSESSMENT:   76 year old male with chronic atrial fibrillation, chronic systolic and diastolic heart failure with most recent EF showing 55% and end-stage renal disease on hemodialysis who presents with chest pain.  Spoke with Calvin Good at bedside. He reports poor PO intake since Monday due to coughing, congestion and secretions. Patient reports eating little to nothing Monday and Tuesday. Had a few spoonfuls of food Tuesday evening, but otherwise didn't eat. Drank some water. He reports losing weight from 101.4 kg to 94kg which is severe for that timeframe. Prior to onset of this acute illness, he reports eating well 3 meals a day. Normally has 3 eggs and toast, rolled oats, in the morning, a couple of peanut butter and jelly sandwiches for lunch, and whatever his daughter may cook for dinner. According to him his daughter is an excellent cook and he has eaten well with no complaints. Also has been drinking great value brand protein shakes due to "my protein being low."  Had some meatloaf, broccoli, and rice for lunch today.  Seems his PO intake is ok, improving, he states he had 1/2 meatloaf, 1/3 rice and 1/5 broccoli.  Meal Completion: 90-50% today  Labs reviewed:  Na 128  Medications reviewed and include:  Phoslo 667mg  TID, Novolog 2 Units TID, levemir 15 units HS    Diet Order:  Diet Heart Room service appropriate? Yes; Fluid  consistency: Thin  Skin:  Reviewed, no issues  Last BM:  12/30/2016  Height:   Ht Readings from Last 1 Encounters:  12/30/16 5\' 8"  (1.727 m)    Weight:   Wt Readings from Last 1 Encounters:  01/01/17 207 lb 4.8 oz (94 kg)    Ideal Body Weight:  70 kg  BMI:  Body mass index is 31.52 kg/m.  Estimated Nutritional Needs:   Kcal:  2000-2300 calories (ABW x22-25 cal/kg)  Protein:  108-135 grams  Fluid:  UOP +1L  EDUCATION NEEDS:   No education needs identified at this time  Satira Anis. [name], MS, RD LDN Inpatient Clinical Dietitian Pager [phone]

## 2017-01-01 NOTE — Progress Notes (Signed)
Inpatient Diabetes Program Recommendations  AACE/ADA: New Consensus Statement on Inpatient Glycemic Control (2015)  Target Ranges:  Prepandial:   less than 140 mg/dL      Peak postprandial:   less than 180 mg/dL (1-2 hours)      Critically ill patients:  140 - 180 mg/dL   Lab Results  Component Value Date   GLUCAP 268 (H) 01/01/2017   HGBA1C 8.4 (H) 11/25/2016    Review of Glycemic ControlResults for Calvin Good, Calvin Good (MRN [id number]) as of 01/01/2017 10:30  Ref. Range 12/31/2016 07:15 12/31/2016 11:19 12/31/2016 16:45 12/31/2016 21:00 01/01/2017 09:23  Glucose-Capillary Latest Ref Range: 65 - 99 mg/dL 315 (H) 321 (H) 260 (H) 309 (H) 268 (H)   Diabetes history: Type 2 diabetes Outpatient Diabetes medications: Levemir 30 units q HS, Humalog 10 units tid with meals Current orders for Inpatient glycemic control:  Novolog sensitive tid with meals, Levemir 15 units q HS  Inpatient Diabetes Program Recommendations:    Please consider increasing Levemir to 25 units q HS.  Also consider adding Novolog meal coverage 5 units tid with meals.   Thanks, Adah Perl, RN, BC-ADM Inpatient Diabetes Coordinator Pager [phone] (8a-5p)

## 2017-01-01 NOTE — Progress Notes (Signed)
  Patient ID: Calvin Good, male   DOB: [birth year], 76 y.o.   MRN: [id number]  HISTORY: Overall he feels better today.  Not short of breath.  More interactive.  No pain.   Vitals:   01/01/17 0402 01/01/17 0952  BP: (!) 132/33 (!) 134/43  Pulse: 66 64  Resp: 18 18  Temp: 98.4 F (36.9 C) 98.4 F (36.9 C)  SpO2: 97% 97%     EXAM:    Resp: Lungs are clear on the left but decreased on the right.  No respiratory distress, normal effort. Heart:  Regular without murmurs.  Palpable thrill LUE. Abd:  Abdomen is soft, non distended and non tender. No masses are palpable.  There is no rebound and no guarding.  Neurological: Alert and oriented to person, place, and time. Coordination normal.  Skin: Skin is warm and dry. No rash noted. No diaphoretic. No erythema. No pallor.  Psychiatric: Normal mood and affect. Normal behavior. Judgment and thought content normal.    ASSESSMENT: Overall he feels better. I have discussed his care with his primary service and we will obtain a ultrasound-guided left thoracentesis in preparation of his right thoracoscopy and talc pleurodesis.   PLAN:   I have discussed his care with our operating room staff and trying to coordinate his dialysis, heparin drip and surgery. There is operating room time on Monday and we will plan on performing a thoracoscopic talc pleurodesis on the right side Monday afternoon following dialysis. He will need to have his dialysis Monday morning so that his surgery can be started on Monday afternoon. The heparin will need to be off on Sunday.    Nestor Lewandowsky, MD

## 2017-01-02 ENCOUNTER — Inpatient Hospital Stay: Payer: Medicare Other

## 2017-01-02 LAB — CBC
HCT: 27.5 % — ABNORMAL LOW (ref 40.0–52.0)
Hemoglobin: 9.4 g/dL — ABNORMAL LOW (ref 13.0–18.0)
MCH: 29.3 pg (ref 26.0–34.0)
MCHC: 34.2 g/dL (ref 32.0–36.0)
MCV: 85.8 fL (ref 80.0–100.0)
PLATELETS: 112 10*3/uL — AB (ref 150–440)
RBC: 3.21 MIL/uL — ABNORMAL LOW (ref 4.40–5.90)
RDW: 15.2 % — AB (ref 11.5–14.5)
WBC: 9.2 10*3/uL (ref 3.8–10.6)

## 2017-01-02 LAB — GLUCOSE, CAPILLARY
GLUCOSE-CAPILLARY: 236 mg/dL — AB (ref 65–99)
Glucose-Capillary: 349 mg/dL — ABNORMAL HIGH (ref 65–99)
Glucose-Capillary: 288 mg/dL — ABNORMAL HIGH (ref 65–99)

## 2017-01-02 LAB — PHOSPHORUS: Phosphorus: 5 mg/dL — ABNORMAL HIGH (ref 2.5–4.6)

## 2017-01-02 LAB — HEPARIN LEVEL (UNFRACTIONATED): HEPARIN UNFRACTIONATED: 0.38 [IU]/mL (ref 0.30–0.70)

## 2017-01-02 MED ORDER — LEVOFLOXACIN 750 MG PO TABS
750.0000 mg | ORAL_TABLET | Freq: Once | ORAL | Status: AC
  Administered 2017-01-02: 750 mg via ORAL
  Filled 2017-01-02: qty 1

## 2017-01-02 MED ORDER — INSULIN DETEMIR 100 UNIT/ML ~~LOC~~ SOLN
20.0000 [IU] | Freq: Every day | SUBCUTANEOUS | Status: DC
  Administered 2017-01-02 – 2017-01-08 (×5): 20 [IU] via SUBCUTANEOUS
  Filled 2017-01-02 (×9): qty 0.2

## 2017-01-02 MED ORDER — LEVOFLOXACIN 500 MG PO TABS
500.0000 mg | ORAL_TABLET | ORAL | Status: DC
  Administered 2017-01-04 – 2017-01-06 (×2): 500 mg via ORAL
  Filled 2017-01-02 (×2): qty 1

## 2017-01-02 NOTE — Care Management (Signed)
Notified Calvin Good with [facility] of admission.  [facility] aware of admission.  Reaching out to palliative liaison to confirm whether patient is receiving home palliative also.  Patient is for  talc pleurodesis on 10/8 after receiving dialysis treatment. Discussed during progression importance of coordinating the need for these two procedures to prevent delay of the pleurodesis

## 2017-01-02 NOTE — Progress Notes (Signed)
Hd ended early d/t pt coughing so hard venous pressure alarms. Alert, vss, report to primary rn, rt notified

## 2017-01-02 NOTE — Progress Notes (Addendum)
[facility], [place] 01/02/17  Subjective:   Patient known to our practice from outpatient dialysis He was sent from dialysis for chest pain He was noted to be in atrial fibrillation with RVR with heart rate of greater than 130 This morning, he states that he does not have any further chest pain Does have significant cough with sputum production   HEMODIALYSIS FLOWSHEET:  Blood Flow Rate (mL/min): 250 mL/min Arterial Pressure (mmHg): -130 mmHg Venous Pressure (mmHg): 240 mmHg Transmembrane Pressure (mmHg): 70 mmHg Ultrafiltration Rate (mL/min): 550 mL/min Dialysate Flow Rate (mL/min): 800 ml/min Conductivity: Machine : 14 Conductivity: Machine : 14 Dialysis Fluid Bolus: Normal Saline Bolus Amount (mL): 200 mL (adm flush r/t venous alarms)    Objective:  Vital signs in last 24 hours:  Temp:  [97.9 F (36.6 C)-98.2 F (36.8 C)] 98 F (36.7 C) (10/04 0415) Pulse Rate:  [64-80] 75 (10/04 0914) Resp:  [16-24] 19 (10/04 0914) BP: (129-173)/(40-76) 143/43 (10/04 0914) SpO2:  [92 %-99 %] 94 % (10/04 0914) Weight:  [99.8 kg (220 lb)] 99.8 kg (220 lb) (10/04 0624)  Weight change: 5.761 kg (12 lb 11.2 oz) Filed Weights   12/31/16 0337 01/01/17 0402 01/02/17 0624  Weight: 93.9 kg (207 lb) 94 kg (207 lb 4.8 oz) 99.8 kg (220 lb)    Intake/Output:    Intake/Output Summary (Last 24 hours) at 01/02/17 1143 Last data filed at 01/02/17 0415  Gross per 24 hour  Intake              480 ml  Output             -100 ml  Net              580 ml     Physical Exam: General: Chronically ill-appearing, sitting up in chair at dialysis  HEENT moist oral mucus membranes  Neck supple  Pulm/lungs Coarse crackles at rt base  CVS/Heart Irregular  Abdomen:  Soft, nontender  Extremities: No edema  Neurologic: Somnolent; able to answer quesions  Skin: No acute rashes  access Left arm AV fistula       Basic Metabolic Panel:    Recent Labs Lab  12/30/16 1624 12/31/16 0151 01/01/17 0914  NA 136 133* 128*  K 3.4* 3.9 4.0  CL 95* 94* 90*  CO2 29 28 26   GLUCOSE 221* 403* 270*  BUN 36* 44* 59*  CREATININE 3.62* 4.52* 6.39*  CALCIUM 8.6* 7.9* 8.1*     CBC:  Recent Labs Lab 12/30/16 1624 12/31/16 0151 01/01/17 0914  WBC 10.3 9.8 8.4  HGB 10.6* 9.6* 9.8*  HCT 30.5* 27.9* 28.6*  MCV 85.2 86.3 86.8  PLT 167 143* 118*      Lab Results  Component Value Date   HEPBSAG Negative 11/27/2016      Microbiology:  Recent Results (from the past 240 hour(s))  Blood Culture (routine x 2)     Status: None (Preliminary result)   Collection Time: 12/30/16  4:28 PM  Result Value Ref Range Status   Specimen Description BLOOD RIGHT ANTECUBITAL  Final   Special Requests   Final    BOTTLES DRAWN AEROBIC AND ANAEROBIC Blood Culture adequate volume   Culture NO GROWTH 3 DAYS  Final   Report Status PENDING  Incomplete  Blood Culture (routine x 2)     Status: None (Preliminary result)   Collection Time: 12/30/16  4:33 PM  Result Value Ref Range Status   Specimen Description BLOOD BLOOD  RIGHT HAND  Final   Special Requests   Final    BOTTLES DRAWN AEROBIC AND ANAEROBIC Blood Culture adequate volume   Culture NO GROWTH 3 DAYS  Final   Report Status PENDING  Incomplete  MRSA PCR Screening     Status: None   Collection Time: 12/31/16 12:50 AM  Result Value Ref Range Status   MRSA by PCR NEGATIVE NEGATIVE Final    Comment:        The GeneXpert MRSA Assay (FDA approved for NASAL specimens only), is one component of a comprehensive MRSA colonization surveillance program. It is not intended to diagnose MRSA infection nor to guide or monitor treatment for MRSA infections.     Coagulation Studies:  Recent Labs  12/31/16 0532  LABPROT 14.2  INR 1.11    Urinalysis: No results for input(s): COLORURINE, LABSPEC, PHURINE, GLUCOSEU, HGBUR, BILIRUBINUR, KETONESUR, PROTEINUR, UROBILINOGEN, NITRITE, LEUKOCYTESUR in the last 72  hours.  Invalid input(s): APPERANCEUR    Imaging: Dg Chest 1 View  Result Date: 01/02/2017 CLINICAL DATA:  Pneumonia.  Cough and congestion. EXAM: CHEST 1 VIEW COMPARISON:  01/01/2017 FINDINGS: Persistent densities at the right lung base with volume loss. No evidence for pulmonary edema. Heart size is upper limits of normal but unchanged. Mild blunting at the left costophrenic angle. Atherosclerotic calcifications at the aortic arch. Trachea is midline. IMPRESSION: Persistent densities and volume loss at the right lung base. Findings may represent a combination of consolidation and pleural fluid. Suspect a small left pleural effusion. Electronically Signed   By: Markus Daft M.D.   On: 01/02/2017 10:49   Dg Chest 2 View  Result Date: 01/01/2017 CLINICAL DATA:  Cough and weakness today. EXAM: CHEST  2 VIEW COMPARISON:  12/30/2016, 12/04/2016 and 09/15/2016 FINDINGS: Lungs are adequately inflated demonstrate persistent opacification over the right mid to lower lung compatible with moderate size effusion likely with associated basilar atelectasis. Cannot exclude infection in the right base. Mild heterogeneous opacification in the left base/ retrocardiac region with nodularity. Small amount left pleural fluid seen posteriorly on the lateral film. Stable cardiomegaly. Calcified plaque over the aortic arch. Degenerative change of the spine. IMPRESSION: Moderate size right pleural effusion likely with associated basilar atelectasis. Small amount left pleural fluid. Mild heterogeneous opacification with nodularity over the left base/retrocardiac region as cannot exclude infection in the lung bases. Stable cardiomegaly. Aortic Atherosclerosis (ICD10-I70.0). Electronically Signed   By: Marin Olp M.D.   On: 01/01/2017 20:35     Medications:   . sodium chloride    . heparin 1,350 Units/hr (01/01/17 1905)   . amiodarone  400 mg Oral Daily  . aspirin EC  81 mg Oral Daily  . atorvastatin  20 mg Oral QHS   . budesonide (PULMICORT) nebulizer solution  0.5 mg Nebulization BID  . calcium acetate  667 mg Oral TID WC  . carvedilol  6.25 mg Oral BID  . doxazosin  8 mg Oral QPM  . furosemide  40 mg Oral BID  . insulin aspart  0-9 Units Subcutaneous TID WC  . insulin detemir  20 Units Subcutaneous QHS  . ipratropium-albuterol  3 mL Nebulization Q6H  . isosorbide mononitrate  60 mg Oral Daily  . [START ON 01/04/2017] levofloxacin  500 mg Oral Q48H  . lisinopril  20 mg Oral QPM  . pantoprazole  40 mg Oral Daily  . sodium chloride flush  3 mL Intravenous Q12H  . spironolactone  25 mg Oral BID   sodium chloride, acetaminophen **OR**  acetaminophen, fentaNYL (SUBLIMAZE) injection, guaiFENesin-dextromethorphan, HYDROcodone-acetaminophen, lidocaine-prilocaine, senna-docusate, sodium chloride flush, traMADol  Assessment/ Plan:  76 y.o. male with diabetes mellitus type II insulin dependent, hypertension, coronary artery disease, hyperlipidemia, prostate cancer status post prostatectomy, right total knee, appendectomy.   MWF [facility] [place] AV[facility]  1. End Stage Renal Disease MWF: Patient seen during dialysis Tolerating well  Patient declining make up treatment for 2.5 hrs. Will stay only for 2 hrs - Next HD planned for SAT and MON UF goal 2 L   2. Anemia of chronic kidney disease/Acute GI bleed 11/26/16.  Hemoglobin 9.8 Continue Procrit with dialysis  3. 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  - Management as per hospitalist.    4. Secondary Hyperparathyroidism:  - monitor phosphorus during hospitalization  5. A Fib with RVR:  It is a recurrent problem Cardiology evaluation ongoing   LOS: 3 Solon Alban 10/4/201811:43 AM  [facility] [place], [place]

## 2017-01-02 NOTE — Progress Notes (Signed)
Pre hd assessment  

## 2017-01-02 NOTE — Progress Notes (Signed)
  Patient ID: Calvin Good, male   DOB: 01/10/1941, 76 y.o.   MRN: [id number]  HISTORY: Overall he feels better today. He is not as short of breath. Yesterday he states he was unable to complete his dialysis because he felt unwell. He describes this as a feeling of just overwhelming fatigue. Today he feels more energetic and has no pain.   Vitals:   01/02/17 0415 01/02/17 0914  BP: (!) 173/45 (!) 143/43  Pulse: 80 75  Resp: 18 19  Temp: 98 F (36.7 C)   SpO2: 92% 94%     EXAM:    Resp: Lungs are clear on the left side but diminished on the right..  No respiratory distress, normal effort. Heart:  Regular without murmurs Abd:  Abdomen is soft, non distended and non tender. No masses are palpable.  There is no rebound and no guarding.  Neurological: Alert and oriented to person, place, and time. Coordination normal.  Skin: Skin is warm and dry. No rash noted. No diaphoretic. No erythema. No pallor.  Psychiatric: Normal mood and affect. Normal behavior. Judgment and thought content normal.    ASSESSMENT: I have independently reviewed his chest x-ray. This shows a moderate size right pleural effusion. The left side looks good.   PLAN:   I discussed his care today with Dr. Lisbeth Renshaw. He was seen by our anesthesiologist yesterday and he is prepared for surgery on Monday. We plan to perform a thoracoscopic talc pleurodesis. The patient has declined a Pleurx catheter. He remained in the hospital several days after his talc pleurodesis procedure. He will need to undergo dialysis Monday morning as his procedure scheduled at 1:00 in the afternoon. He will also need his heparin discontinued prior to the procedure.     Nestor Lewandowsky, MD

## 2017-01-02 NOTE — Progress Notes (Signed)
Post hd assessment 

## 2017-01-02 NOTE — Progress Notes (Signed)
Please note patient is currently followed by Out Patient Palliative NP. CMRN Joni Reining made aware. Out Patient palliative team made aware of admission. Thank you. Flo Shanks RN, BSN, [place] [facility] [place], [facility] [phone] c

## 2017-01-02 NOTE — Progress Notes (Signed)
Hd start, pt alert, productive cough, bfr 350 d/t coughing and moving access arm, goal increased and time decreased per md, labs drawn, pt in hd chair for tx

## 2017-01-02 NOTE — Progress Notes (Signed)
[facility] [place] NAME: Calvin Good    MR#:  [id number]  DATE OF BIRTH:  18-Oct-1940  SUBJECTIVE:   No acute Events overnight. Continues to have cough and some mild shortness of breath  REVIEW OF SYSTEMS:    Review of Systems  Constitutional: Negative for fever, chills weight loss HENT: Negative for ear pain, nosebleeds, congestion, facial swelling, rhinorrhea, neck pain, neck stiffness and ear discharge.   Respiratory: Positive for cough and shortness of breath no wheezing Cardiovascular: Negative for chest pain, palpitations and leg swelling.  Gastrointestinal: Negative for heartburn, abdominal pain, vomiting, diarrhea or consitpation Genitourinary: Negative for dysuria, urgency, frequency, hematuria Musculoskeletal: Negative for back pain or joint pain Neurological: Negative for dizziness, seizures, syncope, focal weakness,  numbness and headaches.  Hematological: Does not bruise/bleed easily.  Psychiatric/Behavioral: Negative for hallucinations, confusion, dysphoric mood    Tolerating Diet: yes      DRUG ALLERGIES:   Allergies  Allergen Reactions  . Tetracyclines & Related Other (See Comments)    Reaction: Unknown     VITALS:  Blood pressure (!) 173/45, pulse 80, temperature 98 F (36.7 C), temperature source Oral, resp. rate 18, height 5\' 8"  (1.727 m), weight 99.8 kg (220 lb), SpO2 92 %.  PHYSICAL EXAMINATION:  Constitutional: Appears well-developed and well-nourished. No distress. HENT: Normocephalic. Marland Kitchen Oropharynx is clear and moist.  Eyes: Conjunctivae and EOM are normal. PERRLA, no scleral icterus.  Neck: Normal ROM. Neck supple. No JVD. No tracheal deviation. CVS RRR S1/S2 +, 2/6 SEM NOgallops, no carotid bruit.  Pulmonary: Decreased breath sounds at right base crackles or wheezing Abdominal: Soft. BS +,  no distension, tenderness, rebound or guarding.  Musculoskeletal: Normal range of motion. No edema and no  tenderness.  Neuro: Alert. CN 2-12 grossly intact. No focal deficits. Skin: Skin is warm and dry. No rash noted. Psychiatric: Normal mood and affect.      LABORATORY PANEL:   CBC  Recent Labs Lab 01/01/17 0914  WBC 8.4  HGB 9.8*  HCT 28.6*  PLT 118*   ------------------------------------------------------------------------------------------------------------------  Chemistries   Recent Labs Lab 01/01/17 0914  NA 128*  K 4.0  CL 90*  CO2 26  GLUCOSE 270*  BUN 59*  CREATININE 6.39*  CALCIUM 8.1*   ------------------------------------------------------------------------------------------------------------------  Cardiac Enzymes  Recent Labs Lab 12/30/16 2036 12/31/16 0151 12/31/16 0941  TROPONINI 0.11* 0.53* 0.56*   ------------------------------------------------------------------------------------------------------------------  RADIOLOGY:  Dg Chest 2 View  Result Date: 01/01/2017 CLINICAL DATA:  Cough and weakness today. EXAM: CHEST  2 VIEW COMPARISON:  12/30/2016, 12/04/2016 and 09/15/2016 FINDINGS: Lungs are adequately inflated demonstrate persistent opacification over the right mid to lower lung compatible with moderate size effusion likely with associated basilar atelectasis. Cannot exclude infection in the right base. Mild heterogeneous opacification in the left base/ retrocardiac region with nodularity. Small amount left pleural fluid seen posteriorly on the lateral film. Stable cardiomegaly. Calcified plaque over the aortic arch. Degenerative change of the spine. IMPRESSION: Moderate size right pleural effusion likely with associated basilar atelectasis. Small amount left pleural fluid. Mild heterogeneous opacification with nodularity over the left base/retrocardiac region as cannot exclude infection in the lung bases. Stable cardiomegaly. Aortic Atherosclerosis (ICD10-I70.0). Electronically Signed   By: Marin Olp M.D.   On: 01/01/2017 20:35      ASSESSMENT AND PLAN:   76 year old male with end-stage renal disease on hemodialysis, chronic diastolic heart failure and PAF who presented with chest pain and found to have atrial  fibrillation with RVR.  1. Atrial fibrillation with RVR: Patient is now normal sinus rhythm with improved heart rates Continue amiodarone 400 mg daily and Coreg He will need anticoagulation prior to discharge.  2. Chest pain: This is due to atrial fibrillation with RVR. Patient was ruled out for acute coronary syndrome.  3. End-stage renal disease on hemodialysis: Patient will continue dialysis on scheduled days  4. Acute on chronic diastolic heart failure: Continue Coreg, Lasix and lisinopril  5. Moderate right pleural effusion: Plan is for talc procedure on Monday with Dr.Oaks. Patient will stay in the hospital until then. I will add antibiotics for infiltrate noted on chest x-ray.   6. 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  7. Diabetes: Continue Levemir and sliding scale with ADA diet  8. Hyponatremia: We'll repeat sodium and follow    Management plans discussed with the patient and he is in agreement.  CODE STATUS: full  TOTAL TIME TAKING CARE OF THIS PATIENT: 30 minutes.     POSSIBLE D/C tuesday, DEPENDING ON CLINICAL CONDITION.   Calvin Good M.D on 01/02/2017 at 9:01 AM  Between 7am to 6pm - Pager - [phone] After 6pm go to [email or web address] - password [email or web address] [facility]  Office  [phone]  CC: Primary care physician; [name], MD  Note: This dictation was prepared with Dragon dictation along with smaller phrase technology. Any transcriptional errors that result from this process are unintentional.

## 2017-01-02 NOTE — Progress Notes (Signed)
[facility] Encounter Note  Patient: Calvin Good / Admit Date: 12/30/2016 / Date of Encounter: 01/02/2017, 8:02 AM   Subjective: Patient overall feeling much better at this time with less shortness of breath and no further episodes of chest discomfort. Troponin level peaked at 0.1 consistent with demand ischemia rather than acute coronary syndrome. Patient has converted to normal sinus rhythm with intravenous amiodarone and now switched to oral medication  Review of Systems: Positive for: Shortness of breath cough congestion Negative for: Vision change, hearing change, syncope, dizziness, nausea, vomiting,diarrhea, bloody stool, stomach pain, positive for cough, congestion, negative for diaphoresis, urinary frequency, urinary pain,skin lesions, skin rashes Others previously listed  Objective: Telemetry: Normal sinus rhythm Physical Exam: Blood pressure (!) 173/45, pulse 80, temperature 98 F (36.7 C), temperature source Oral, resp. rate 18, height 5\' 8"  (1.727 m), weight 99.8 kg (220 lb), SpO2 92 %. Body mass index is 33.45 kg/m. General: Well developed, well nourished, in no acute distress. Head: Normocephalic, atraumatic, sclera non-icteric, no xanthomas, nares are without discharge. Neck: No apparent masses Lungs: Normal respirations with some wheezes, no rhonchi, no rales , no crackles   Heart: Regular rate and rhythm, normal S1 S2, no murmur, no rub, no gallop, PMI is normal size and placement, carotid upstroke normal without bruit, jugular venous pressure normal Abdomen: Soft, non-tender, non-distended with normoactive bowel sounds. No hepatosplenomegaly. Abdominal aorta is normal size without bruit Extremities: 1-2+ edema, no clubbing, no cyanosis, no ulcers,  Peripheral: 2+ radial, 2+ femoral, 2+ dorsal pedal pulses Neuro: Alert and oriented. Moves all extremities spontaneously. Psych:  Responds to questions appropriately with a normal  affect.   Intake/Output Summary (Last 24 hours) at 01/02/17 0802 Last data filed at 01/02/17 0415  Gross per 24 hour  Intake              720 ml  Output             -100 ml  Net              820 ml    Inpatient Medications:  . amiodarone  400 mg Oral Daily  . aspirin EC  81 mg Oral Daily  . atorvastatin  20 mg Oral QHS  . budesonide (PULMICORT) nebulizer solution  0.5 mg Nebulization BID  . calcium acetate  667 mg Oral TID WC  . carvedilol  6.25 mg Oral BID  . doxazosin  8 mg Oral QPM  . furosemide  40 mg Oral BID  . insulin aspart  0-9 Units Subcutaneous TID WC  . insulin detemir  15 Units Subcutaneous QHS  . ipratropium-albuterol  3 mL Nebulization Q6H  . isosorbide mononitrate  60 mg Oral Daily  . lisinopril  20 mg Oral QPM  . methylPREDNISolone (SOLU-MEDROL) injection  40 mg Intravenous Daily  . pantoprazole  40 mg Oral Daily  . sodium chloride flush  3 mL Intravenous Q12H  . spironolactone  25 mg Oral BID   Infusions:  . sodium chloride    . heparin 1,350 Units/hr (01/01/17 1905)    Labs:  Recent Labs  12/31/16 0151 01/01/17 0914  NA 133* 128*  K 3.9 4.0  CL 94* 90*  CO2 28 26  GLUCOSE 403* 270*  BUN 44* 59*  CREATININE 4.52* 6.39*  CALCIUM 7.9* 8.1*   No results for input(s): AST, ALT, ALKPHOS, BILITOT, PROT, ALBUMIN in the last 72 hours.  Recent Labs  12/31/16 0151 01/01/17 0914  WBC 9.8 8.4  HGB  9.6* 9.8*  HCT 27.9* 28.6*  MCV 86.3 86.8  PLT 143* 118*    Recent Labs  12/30/16 1624 12/30/16 2036 12/31/16 0151 12/31/16 0941  TROPONINI <0.03 0.11* 0.53* 0.56*   Invalid input(s): POCBNP No results for input(s): HGBA1C in the last 72 hours.   Weights: Filed Weights   12/31/16 1324 01/01/17 0402 01/02/17 0624  Weight: 93.9 kg (207 lb) 94 kg (207 lb 4.8 oz) 99.8 kg (220 lb)     Radiology/Studies:  Dg Chest 2 View  Result Date: 01/01/2017 CLINICAL DATA:  Cough and weakness today. EXAM: CHEST  2 VIEW COMPARISON:  12/30/2016,  12/04/2016 and 09/15/2016 FINDINGS: Lungs are adequately inflated demonstrate persistent opacification over the right mid to lower lung compatible with moderate size effusion likely with associated basilar atelectasis. Cannot exclude infection in the right base. Mild heterogeneous opacification in the left base/ retrocardiac region with nodularity. Small amount left pleural fluid seen posteriorly on the lateral film. Stable cardiomegaly. Calcified plaque over the aortic arch. Degenerative change of the spine. IMPRESSION: Moderate size right pleural effusion likely with associated basilar atelectasis. Small amount left pleural fluid. Mild heterogeneous opacification with nodularity over the left base/retrocardiac region as cannot exclude infection in the lung bases. Stable cardiomegaly. Aortic Atherosclerosis (ICD10-I70.0). Electronically Signed   By: Marin Olp M.D.   On: 01/01/2017 20:35   Dg Chest Portable 1 View  Result Date: 12/30/2016 CLINICAL DATA:  Chest pain and shortness of breath. Uncontrolled atrial fibrillation. Recent dialysis. EXAM: PORTABLE CHEST 1 VIEW COMPARISON:  12/05/2016 FINDINGS: Stable mild cardiomegaly.  Aortic atherosclerosis. Increased size of small right pleural effusion. No evidence of pulmonary consolidation or frank pulmonary edema. IMPRESSION: Increased size of small right pleural effusion. Stable mild cardiomegaly. Electronically Signed   By: Earle Gell M.D.   On: 12/30/2016 16:53   Dg Chest Port 1 View  Result Date: 12/05/2016 CLINICAL DATA:  Right pleural effusion, status post thoracentesis EXAM: PORTABLE CHEST 1 VIEW COMPARISON:  12/04/2016 FINDINGS: Small residual right pleural effusion following thoracentesis. No pneumothorax. Minor right base atelectasis. Normal heart size and vascularity. Left lung remains clear. Atherosclerosis of the aorta. Trachea is midline IMPRESSION: Residual small right pleural effusion following thoracentesis. Negative for pneumothorax.  Stable exam. Electronically Signed   By: Jerilynn Mages.  Shick M.D.   On: 12/05/2016 09:44   Dg Chest Portable 1 View  Result Date: 12/04/2016 CLINICAL DATA:  Weakness and fatigue after dialysis today. History of atrial fibrillation, metastatic prostate cancer. EXAM: PORTABLE CHEST 1 VIEW COMPARISON:  Chest radiograph November 25, 2016 and PET-CT October 22, 2016. FINDINGS: Stable cardiomegaly. Calcified aortic knob. Moderate RIGHT pleural effusion and underlying consolidation persists. LEFT lung is clear. Mild pulmonary vascular congestion. No pneumothorax. Soft tissue planes and included osseous structures are unchanged. IMPRESSION: Similar to increased moderate RIGHT pleural effusion with underlying consolidation. Mild cardiomegaly and pulmonary vascular congestion. Electronically Signed   By: Elon Alas M.D.   On: 12/04/2016 21:32   US Thoracentesis Asp Pleural Space W/img Guide  Result Date: 12/05/2016 INDICATION: CHF, end-stage renal disease, recurrent right pleural effusion, shortness of breath EXAM: ULTRASOUND GUIDED RIGHT THORACENTESIS MEDICATIONS: 1% lidocaine local COMPLICATIONS: None immediate. PROCEDURE: An ultrasound guided thoracentesis was thoroughly discussed with the patient and questions answered. The benefits, risks, alternatives and complications were also discussed. The patient understands and wishes to proceed with the procedure. Written consent was obtained. Ultrasound was performed to localize and mark an adequate pocket of fluid in the right chest. The area was then  prepped and draped in the normal sterile fashion. 1% Lidocaine was used for local anesthesia. Under ultrasound guidance a 6 Fr Safe-T-Centesis catheter was introduced. Thoracentesis was performed. The catheter was removed and a dressing applied. FINDINGS: A total of approximately 1.2 L of clear pleural fluid was removed. Samples were sent to the laboratory as requested by the clinical team. IMPRESSION: Successful ultrasound guided  right thoracentesis yielding 1.2 L of pleural fluid. Electronically Signed   By: Jerilynn Mages.  Shick M.D.   On: 12/05/2016 09:18     Assessment and Recommendation  76 y.o. 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 Continue amiodarone but changed drip to oral amiodarone 400 mg each day 2. Anticoagulation when able for further risk reduction in stroke with atrial fibrillation watching closely for significant bleeding and anemia 3. Continuation of hypertension control and heart rate control with carvedilol 4. ACE inhibitor for hypertension control 5. Further cardiac diagnostics necessary at this time 6. Proceed to dialysis without restriction later today and okay for discharge home from cardiac standpoint with adjustments of medications as outpatient next week  Signed, Serafina Royals M.D. FACC

## 2017-01-02 NOTE — Progress Notes (Signed)
Physical Therapy Treatment Patient Details Name: Calvin Good MRN: [id number] DOB: 12-Sep-1940 Today's Date: 01/02/2017    History of Present Illness presented to ER from dialysis secondary to chest pain, elevated HR (130-150s); admitted with afib with RVR, ultimately requiring amioderone drip for control and conversion to NSR.    PT Comments    Pt getting up to chair with nursing upon arrival.  Monitored sats during seated exercises prior to gait.  Pt ready lower on portable finger monitor than larger wheeled vital monitor.  93% on 4.5 lpm at rest.  Pt wanting to ambulate this morning.  Pt stated he does not need walker "I'm fine" and was able to ambulate around nursing unit on 5LPM.  Gait generally steady initially but overall quality was affected as he fatigued.  Decreased step height and length bilaterally and needed verbal cues to take rest breaks and to increase step height.  Pt reported LE weakness upon return to chair but was hesitant to admit during actual gait.  O2 sats 88% upon return to room but improved to baseline with short rest.   Primary nurse aware.  Discussed with pt benefits of walker at this point primarily for energy conservation.  While he does well without AD for short distances, he would benefit from a walker for outside ambulation or in community until strength and endurance improve.   Follow Up Recommendations  Home health PT     Equipment Recommendations       Recommendations for Other Services       Precautions / Restrictions Precautions Precautions: Fall Precaution Comments: No BP L UE Restrictions Weight Bearing Restrictions: No    Mobility  Bed Mobility               General bed mobility comments: in chair  Transfers Overall transfer level: Needs assistance Equipment used: Rolling walker (2 wheeled) Transfers: Sit to/from Stand Sit to Stand: Supervision            Ambulation/Gait   Ambulation Distance (Feet): 150 Feet Assistive  device: None Gait Pattern/deviations: Step-through pattern   Gait velocity interpretation: Below normal speed for age/gender General Gait Details: decreased step height bilaterally which decreases when fatiged.   Stairs            Wheelchair Mobility    Modified Rankin (Stroke Patients Only)       Balance Overall balance assessment: Needs assistance Sitting-balance support: No upper extremity supported;Feet supported Sitting balance-Leahy Scale: Good     Standing balance support: No upper extremity supported Standing balance-Leahy Scale: Fair                              Cognition Arousal/Alertness: Awake/alert Behavior During Therapy: WFL for tasks assessed/performed Overall Cognitive Status: Within Functional Limits for tasks assessed                                        Exercises Other Exercises Other Exercises: seated AROM BLE 3 x 10 while monitoring O2 sats prior to gait.    General Comments        Pertinent Vitals/Pain Pain Assessment: No/denies pain    Home Living                      Prior Function  PT Goals (current goals can now be found in the care plan section) Progress towards PT goals: Progressing toward goals    Frequency    Min 2X/week      PT Plan Current plan remains appropriate    Co-evaluation              AM-PAC PT "6 Clicks" Daily Activity  Outcome Measure  Difficulty turning over in bed (including adjusting bedclothes, sheets and blankets)?: None Difficulty moving from lying on back to sitting on the side of the bed? : None Difficulty sitting down on and standing up from a chair with arms (e.g., wheelchair, bedside commode, etc,.)?: A Little Help needed moving to and from a bed to chair (including a wheelchair)?: A Little Help needed walking in hospital room?: A Little Help needed climbing 3-5 steps with a railing? : A Little 6 Click Score: 20    End of  Session Equipment Utilized During Treatment: Gait belt;Oxygen Activity Tolerance: Patient tolerated treatment well Patient left: in chair;with chair alarm set;with call bell/phone within reach;with nursing/sitter in room Nurse Communication: Other (comment)       Time: 8887-5797 PT Time Calculation (min) (ACUTE ONLY): 40 min  Charges:  $Gait Training: 8-22 mins $Therapeutic Exercise: 23-37 mins                    G Codes:       Chesley Noon, PTA 01/02/17, 9:26 AM

## 2017-01-02 NOTE — Plan of Care (Signed)
Problem: Physical Regulation: Goal: Ability to maintain clinical measurements within normal limits will improve Outcome: Not Progressing Patient's sodium level today = only 128. Will continue to monitor. [facility]

## 2017-01-02 NOTE — Progress Notes (Signed)
Pre hd info 

## 2017-01-02 NOTE — Progress Notes (Signed)
Inpatient Diabetes Program Recommendations  AACE/ADA: New Consensus Statement on Inpatient Glycemic Control (2015)  Target Ranges:  Prepandial:   less than 140 mg/dL      Peak postprandial:   less than 180 mg/dL (1-2 hours)      Critically ill patients:  140 - 180 mg/dL   Results for HARVEY, MATLACK (MRN [id number]) as of 01/02/2017 07:44  Ref. Range 12/31/2016 21:00 01/01/2017 09:23 01/01/2017 11:43 01/01/2017 16:14 01/01/2017 20:51  Glucose-Capillary Latest Ref Range: 65 - 99 mg/dL 309 (H) 268 (H) 239 (H) 153 (H) 243 (H)   Review of Glycemic Control  Diabetes history: DM2 Outpatient Diabetes medications: Levemir 30 units QHS, Humalog 10 units TID with meals Current orders for Inpatient glycemic control: Levemir 15 units QHS, Novolog 0-9 units TID with meals  Inpatient Diabetes Program Recommendations:  Insulin - Basal: Please consider increasing Levemir to 20 units QHS. Correction (SSI): Please consider ordering Novolog 0-5 units QHS for bedtime correction scale. Insulin - Meal Coverage: If steroids are continued, please consider ordering Novolog 3 units TID with meals for meal coverage.  Thanks, Barnie Alderman, RN, MSN, CDE Diabetes Coordinator Inpatient Diabetes Program [phone] (Team Pager from 8am to 5pm)

## 2017-01-02 NOTE — Progress Notes (Signed)
ANTICOAGULATION CONSULT NOTE -  Follow UP   Pharmacy Consult for heparin drip Indication: chest pain/ACS/elevated troponin  Allergies  Allergen Reactions  . Tetracyclines & Related Other (See Comments)    Reaction: Unknown     Patient Measurements: Height: 5\' 8"  (172.7 cm) Weight: 220 lb (99.8 kg) IBW/kg (Calculated) : 68.4 Heparin Dosing Weight: 88 kg  Vital Signs: Temp: 97.6 F (36.4 C) (10/04 1300) Temp Source: Oral (10/04 1300) BP: 148/52 (10/04 1320) Pulse Rate: 68 (10/04 1320)  Labs:  Recent Labs  12/30/16 1624 12/30/16 2036 12/31/16 0151 12/31/16 0532 12/31/16 0941  01/01/17 0914 01/01/17 1859 01/02/17 1142  HGB 10.6*  --  9.6*  --   --   --  9.8*  --  9.4*  HCT 30.5*  --  27.9*  --   --   --  28.6*  --  27.5*  PLT 167  --  143*  --   --   --  118*  --  112*  APTT  --   --   --  39*  --   --   --   --   --   LABPROT  --   --   --  14.2  --   --   --   --   --   INR  --   --   --  1.11  --   --   --   --   --   HEPARINUNFRC  --   --   --   --   --   < > 0.32 0.34 0.38  CREATININE 3.62*  --  4.52*  --   --   --  6.39*  --   --   TROPONINI <0.03 0.11* 0.53*  --  0.56*  --   --   --   --   < > = values in this interval not displayed.  Estimated Creatinine Clearance: 10.9 mL/min (A) (by C-G formula based on SCr of 6.39 mg/dL (H)).   Medical History: Past Medical History:  Diagnosis Date  . Cancer [facility])    Prostate  . CHF (congestive heart failure) ([place])   . Chronic kidney disease   . Complication of anesthesia    hard to wake up  . COPD (chronic obstructive pulmonary disease) ([place])   . Diabetes mellitus without complication ([place])   . Difficult intubation   . Dyspnea   . GERD (gastroesophageal reflux disease)   . Hyperlipidemia   . Hypertension   . Pleural effusion   . Renal insufficiency     Medications:  No anticoagulation in PTA meds  Assessment:  Goal of Therapy:  Heparin level 0.3-0.7 units/ml Monitor platelets by anticoagulation  protocol: Yes   Plan:  Heparin level resulted @ 0.34. Will continue current rate of heparin. Will recheck heparin level @ 2200  Chrisangel Eskenazi D 01/02/2017,1:22 PM   10/02 2300 heparin level 0.22. 1300 unit bolus and increase rate to 1350 units/hr. Recheck in 8 hours.  10/03 Heparin level resulted @ 0.32. Will continue current heparin gtt rate and will recheck heparin level  In 8 hours.   10/03 1859 HL therapeutic x 2. Continue current rate. Pharmacy will continue to monitor daily HL and CBC.  10/4: Heparin level therapeutic. Recheck Heparin level with am labs.   Larene Beach, PharmD  Clinical Pharmacist 01/02/17 1:22 PM

## 2017-01-02 NOTE — Progress Notes (Signed)
Pharmacy Antibiotic Note  Grae Cannata is a 76 y.o. male with pneumonia .  Pharmacy has been consulted for levofloxacin dosing.  Plan: Will start levofloxacin 750 mg PO x 1 then 500 mg PO q48 hours.   Height: 5\' 8"  (172.7 cm) Weight: 220 lb (99.8 kg) IBW/kg (Calculated) : 68.4  Temp (24hrs), Avg:98 F (36.7 C), Min:97.9 F (36.6 C), Max:98.2 F (36.8 C)   Recent Labs Lab 12/30/16 1624 12/30/16 2036 12/31/16 0151 01/01/17 0914  WBC 10.3  --  9.8 8.4  CREATININE 3.62*  --  4.52* 6.39*  LATICACIDVEN 1.2 1.1  --   --     Estimated Creatinine Clearance: 10.9 mL/min (A) (by C-G formula based on SCr of 6.39 mg/dL (H)).    Allergies  Allergen Reactions  . Tetracyclines & Related Other (See Comments)    Reaction: Unknown     Antimicrobials this admission: Levofloxacin 10/4 >>    >>   Dose adjustments this admission:   Microbiology results:  BCx:   UCx:    Sputum:    MRSA PCR:   Thank you for allowing pharmacy to be a part of this patient's care.  Caprice Wasko D 01/02/2017 9:53 AM

## 2017-01-02 NOTE — Progress Notes (Signed)
Post hd vitals 

## 2017-01-03 LAB — CBC
HCT: 26.7 % — ABNORMAL LOW (ref 40.0–52.0)
HEMATOCRIT: 28.9 % — AB (ref 40.0–52.0)
HEMOGLOBIN: 10.1 g/dL — AB (ref 13.0–18.0)
HEMOGLOBIN: 9.1 g/dL — AB (ref 13.0–18.0)
MCH: 29.4 pg (ref 26.0–34.0)
MCH: 30.4 pg (ref 26.0–34.0)
MCHC: 34.2 g/dL (ref 32.0–36.0)
MCHC: 35 g/dL (ref 32.0–36.0)
MCV: 85.9 fL (ref 80.0–100.0)
MCV: 86.9 fL (ref 80.0–100.0)
PLATELETS: 122 10*3/uL — AB (ref 150–440)
Platelets: 125 10*3/uL — ABNORMAL LOW (ref 150–440)
RBC: 3.11 MIL/uL — ABNORMAL LOW (ref 4.40–5.90)
RBC: 3.33 MIL/uL — ABNORMAL LOW (ref 4.40–5.90)
RDW: 15.2 % — AB (ref 11.5–14.5)
RDW: 15.3 % — AB (ref 11.5–14.5)
WBC: 7.9 10*3/uL (ref 3.8–10.6)
WBC: 9.8 10*3/uL (ref 3.8–10.6)

## 2017-01-03 LAB — BASIC METABOLIC PANEL
Anion gap: 12 (ref 5–15)
BUN: 62 mg/dL — ABNORMAL HIGH (ref 6–20)
CHLORIDE: 93 mmol/L — AB (ref 101–111)
CO2: 26 mmol/L (ref 22–32)
CREATININE: 6.26 mg/dL — AB (ref 0.61–1.24)
Calcium: 8.4 mg/dL — ABNORMAL LOW (ref 8.9–10.3)
GFR, EST AFRICAN AMERICAN: 9 mL/min — AB (ref >60)
GFR, EST NON AFRICAN AMERICAN: 8 mL/min — AB (ref >60)
Glucose, Bld: 223 mg/dL — ABNORMAL HIGH (ref 65–99)
POTASSIUM: 4.4 mmol/L (ref 3.5–5.1)
Sodium: 131 mmol/L — ABNORMAL LOW (ref 135–145)

## 2017-01-03 LAB — HEPARIN LEVEL (UNFRACTIONATED)
HEPARIN UNFRACTIONATED: 0.26 [IU]/mL — AB (ref 0.30–0.70)
Heparin Unfractionated: 0.32 IU/mL (ref 0.30–0.70)

## 2017-01-03 LAB — GLUCOSE, CAPILLARY
GLUCOSE-CAPILLARY: 193 mg/dL — AB (ref 65–99)
GLUCOSE-CAPILLARY: 252 mg/dL — AB (ref 65–99)
GLUCOSE-CAPILLARY: 211 mg/dL — AB (ref 65–99)
Glucose-Capillary: 189 mg/dL — ABNORMAL HIGH (ref 65–99)

## 2017-01-03 MED ORDER — INSULIN ASPART 100 UNIT/ML ~~LOC~~ SOLN
0.0000 [IU] | Freq: Three times a day (TID) | SUBCUTANEOUS | Status: DC
  Administered 2017-01-04: 3 [IU] via SUBCUTANEOUS
  Administered 2017-01-04: 5 [IU] via SUBCUTANEOUS
  Administered 2017-01-05 – 2017-01-08 (×6): 2 [IU] via SUBCUTANEOUS
  Administered 2017-01-08: 7 [IU] via SUBCUTANEOUS
  Administered 2017-01-08: 5 [IU] via SUBCUTANEOUS
  Administered 2017-01-09: 9 [IU] via SUBCUTANEOUS
  Administered 2017-01-09: 5 [IU] via SUBCUTANEOUS
  Administered 2017-01-09: 7 [IU] via SUBCUTANEOUS
  Administered 2017-01-10: 2 [IU] via SUBCUTANEOUS
  Administered 2017-01-10: 7 [IU] via SUBCUTANEOUS
  Administered 2017-01-11: 3 [IU] via SUBCUTANEOUS
  Administered 2017-01-11 (×2): 7 [IU] via SUBCUTANEOUS
  Administered 2017-01-12: 3 [IU] via SUBCUTANEOUS
  Administered 2017-01-12 – 2017-01-13 (×2): 2 [IU] via SUBCUTANEOUS
  Administered 2017-01-13: 1 [IU] via SUBCUTANEOUS
  Administered 2017-01-14: 2 [IU] via SUBCUTANEOUS
  Administered 2017-01-14: 5 [IU] via SUBCUTANEOUS
  Administered 2017-01-15: 2 [IU] via SUBCUTANEOUS
  Administered 2017-01-16: 1 [IU] via SUBCUTANEOUS
  Filled 2017-01-03 (×22): qty 1

## 2017-01-03 MED ORDER — INSULIN ASPART 100 UNIT/ML ~~LOC~~ SOLN
0.0000 [IU] | Freq: Every day | SUBCUTANEOUS | Status: DC
  Administered 2017-01-07: 2 [IU] via SUBCUTANEOUS
  Administered 2017-01-08: 5 [IU] via SUBCUTANEOUS
  Administered 2017-01-09: 4 [IU] via SUBCUTANEOUS
  Administered 2017-01-10: 3 [IU] via SUBCUTANEOUS
  Administered 2017-01-11 – 2017-01-13 (×2): 2 [IU] via SUBCUTANEOUS
  Administered 2017-01-14: 3 [IU] via SUBCUTANEOUS
  Filled 2017-01-03 (×7): qty 1

## 2017-01-03 MED ORDER — INSULIN ASPART 100 UNIT/ML ~~LOC~~ SOLN
3.0000 [IU] | Freq: Three times a day (TID) | SUBCUTANEOUS | Status: DC
  Administered 2017-01-03 – 2017-01-09 (×13): 3 [IU] via SUBCUTANEOUS
  Filled 2017-01-03 (×13): qty 1

## 2017-01-03 MED ORDER — VANCOMYCIN HCL IN DEXTROSE 1-5 GM/200ML-% IV SOLN
1000.0000 mg | Freq: Once | INTRAVENOUS | Status: AC
  Administered 2017-01-06: 1000 mg via INTRAVENOUS

## 2017-01-03 MED ORDER — HEPARIN BOLUS VIA INFUSION
1300.0000 [IU] | Freq: Once | INTRAVENOUS | Status: AC
  Administered 2017-01-03: 1300 [IU] via INTRAVENOUS
  Filled 2017-01-03: qty 1300

## 2017-01-03 MED ORDER — GUAIFENESIN-CODEINE 100-10 MG/5ML PO SOLN
5.0000 mL | ORAL | Status: DC | PRN
  Administered 2017-01-03 – 2017-01-04 (×6): 5 mL via ORAL
  Filled 2017-01-03 (×7): qty 5

## 2017-01-03 MED ORDER — BENZONATATE 100 MG PO CAPS
200.0000 mg | ORAL_CAPSULE | Freq: Three times a day (TID) | ORAL | Status: DC | PRN
  Administered 2017-01-03 – 2017-01-12 (×12): 200 mg via ORAL
  Filled 2017-01-03 (×12): qty 2

## 2017-01-03 MED ORDER — NEPRO/CARBSTEADY PO LIQD
237.0000 mL | Freq: Two times a day (BID) | ORAL | Status: DC
  Administered 2017-01-03 – 2017-01-16 (×14): 237 mL via ORAL

## 2017-01-03 NOTE — Progress Notes (Signed)
Physical Therapy Treatment Patient Details Name: Calvin Good MRN: [id number] DOB: 1940/10/06 Today's Date: 01/03/2017    History of Present Illness presented to ER from dialysis secondary to chest pain, elevated HR (130-150s); admitted with afib with RVR, ultimately requiring amioderone drip for control and conversion to NSR.    PT Comments    Pt continues to make progress with therapy. He is able to ambulate 2 laps around RN station with therapist. Pt on 6L/min O2 and SaO2 remains around 90% during ambulation. Pt reporting mild DOE with ambulation and requires 2 standing rest breaks while ambulating. Pt demonstrates good stability with gait and does not require external assistance to stabilize. He is able to complete seated exercises as instructed but considerably fatigued following ambultion. Pt will benefit from PT services to address deficits in strength, balance, and mobility in order to return to full function at home.    Follow Up Recommendations  Home health PT     Equipment Recommendations       Recommendations for Other Services       Precautions / Restrictions Precautions Precautions: Fall Precaution Comments: No BP L UE Restrictions Weight Bearing Restrictions: No    Mobility  Bed Mobility Overal bed mobility: Modified Independent             General bed mobility comments: HOB elevated, use of bed rails. Fair speed  Transfers Overall transfer level: Needs assistance Equipment used: Rolling walker (2 wheeled) Transfers: Sit to/from Stand Sit to Stand: Supervision         General transfer comment: Safe hand placement. Good speed, sequencing, and stability with transfers  Ambulation/Gait Ambulation/Gait assistance: Supervision Ambulation Distance (Feet): 450 Feet Assistive device: None Gait Pattern/deviations: Step-through pattern   Gait velocity interpretation: at or above normal speed for age/gender General Gait Details: Pt able to ambulate 2  laps around RN station with therapist. Pt on 6L/min O2 and SaO2 remains around 90% during ambulation. Pt reporting mild DOE with ambulation and requires 2 standing rest breaks while ambulating. Pt demonstrates good stability with gait and does not require external assistance to stabilize   Stairs            Wheelchair Mobility    Modified Rankin (Stroke Patients Only)       Balance Overall balance assessment: Needs assistance Sitting-balance support: No upper extremity supported Sitting balance-Leahy Scale: Good     Standing balance support: No upper extremity supported Standing balance-Leahy Scale: Fair                              Cognition Arousal/Alertness: Awake/alert Behavior During Therapy: WFL for tasks assessed/performed Overall Cognitive Status: Within Functional Limits for tasks assessed                                        Exercises General Exercises - Lower Extremity Long Arc Quad: Strengthening;Both;10 reps;Seated Heel Slides: Strengthening;Both;10 reps;Seated Hip ABduction/ADduction: Strengthening;Both;10 reps;Seated Hip Flexion/Marching: Strengthening;Both;10 reps;Seated    General Comments        Pertinent Vitals/Pain Pain Assessment: No/denies pain    Home Living                      Prior Function            PT Goals (current goals can now be found in the  care plan section) Acute Rehab PT Goals Patient Stated Goal: to do what you ask of me PT Goal Formulation: With patient Time For Goal Achievement: 01/14/17 Potential to Achieve Goals: Good Progress towards PT goals: Progressing toward goals    Frequency    Min 2X/week      PT Plan Current plan remains appropriate    Co-evaluation              AM-PAC PT "6 Clicks" Daily Activity  Outcome Measure  Difficulty turning over in bed (including adjusting bedclothes, sheets and blankets)?: None Difficulty moving from lying on back to  sitting on the side of the bed? : None Difficulty sitting down on and standing up from a chair with arms (e.g., wheelchair, bedside commode, etc,.)?: None Help needed moving to and from a bed to chair (including a wheelchair)?: None Help needed walking in hospital room?: A Little Help needed climbing 3-5 steps with a railing? : A Little 6 Click Score: 22    End of Session Equipment Utilized During Treatment: Gait belt;Oxygen Activity Tolerance: Patient tolerated treatment well Patient left: with call bell/phone within reach;in bed;with bed alarm set;Other (comment) (Declines up to chair at this time)   PT Visit Diagnosis: Difficulty in walking, not elsewhere classified (R26.2);Muscle weakness (generalized) (M62.81)     Time: 3354-5625 PT Time Calculation (min) (ACUTE ONLY): 28 min  Charges:  $Gait Training: 8-22 mins $Therapeutic Exercise: 8-22 mins                    G Codes:       Lyndel Safe Huprich PT, DPT     Huprich,Jason 01/03/2017, 11:57 AM

## 2017-01-03 NOTE — Progress Notes (Signed)
[facility], [place] 01/03/17  Subjective:   Patient known to our practice from outpatient dialysis He was sent from dialysis for chest pain He was noted to be in atrial fibrillation with RVR with heart rate of greater than 130   Patient has been cutting his treatment short due to discomfort from cough With cough suppressants, he was able to sleep overnight He feels better today   Objective:  Vital signs in last 24 hours:  Temp:  [98 F (36.7 C)-98.2 F (36.8 C)] 98.2 F (36.8 C) (10/05 1148) Pulse Rate:  [69-76] 70 (10/05 1148) Resp:  [14-19] 14 (10/05 1148) BP: (117-166)/(28-95) 117/95 (10/05 1148) SpO2:  [93 %-99 %] 95 % (10/05 1148) Weight:  [102.7 kg (226 lb 6.4 oz)] 102.7 kg (226 lb 6.4 oz) (10/05 0356)  Weight change: 2.903 kg (6 lb 6.4 oz) Filed Weights   01/01/17 0402 01/02/17 0624 01/03/17 0356  Weight: 94 kg (207 lb 4.8 oz) 99.8 kg (220 lb) 102.7 kg (226 lb 6.4 oz)    Intake/Output:    Intake/Output Summary (Last 24 hours) at 01/03/17 1608 Last data filed at 01/03/17 1426  Gross per 24 hour  Intake              720 ml  Output                0 ml  Net              720 ml     Physical Exam: General: Chronically ill-appearing,   HEENT moist oral mucus membranes  Neck supple  Pulm/lungs Coarse crackles at rt base  CVS/Heart Irregular  Abdomen:  Soft, nontender  Extremities: + peripheral edema  Neurologic: Alert and oriented  Skin: No acute rashes  access Left arm AV fistula       Basic Metabolic Panel:    Recent Labs Lab 12/30/16 1624 12/31/16 0151 01/01/17 0914 01/02/17 1142 01/03/17 0605  NA 136 133* 128*  --  131*  K 3.4* 3.9 4.0  --  4.4  CL 95* 94* 90*  --  93*  CO2 29 28 26   --  26  GLUCOSE 221* 403* 270*  --  223*  BUN 36* 44* 59*  --  62*  CREATININE 3.62* 4.52* 6.39*  --  6.26*  CALCIUM 8.6* 7.9* 8.1*  --  8.4*  PHOS  --   --   --  5.0*  --      CBC:  Recent Labs Lab 12/30/16 1624  12/31/16 0151 01/01/17 0914 01/02/17 1142 01/03/17 0605  WBC 10.3 9.8 8.4 9.2 9.8  HGB 10.6* 9.6* 9.8* 9.4* 10.1*  HCT 30.5* 27.9* 28.6* 27.5* 28.9*  MCV 85.2 86.3 86.8 85.8 86.9  PLT 167 143* 118* 112* 125*      Lab Results  Component Value Date   HEPBSAG Negative 11/27/2016      Microbiology:  Recent Results (from the past 240 hour(s))  Blood Culture (routine x 2)     Status: None (Preliminary result)   Collection Time: 12/30/16  4:28 PM  Result Value Ref Range Status   Specimen Description BLOOD RIGHT ANTECUBITAL  Final   Special Requests   Final    BOTTLES DRAWN AEROBIC AND ANAEROBIC Blood Culture adequate volume   Culture NO GROWTH 4 DAYS  Final   Report Status PENDING  Incomplete  Blood Culture (routine x 2)     Status: None (Preliminary result)   Collection Time: 12/30/16  4:33 PM  Result Value Ref Range Status   Specimen Description BLOOD BLOOD RIGHT HAND  Final   Special Requests   Final    BOTTLES DRAWN AEROBIC AND ANAEROBIC Blood Culture adequate volume   Culture NO GROWTH 4 DAYS  Final   Report Status PENDING  Incomplete  MRSA PCR Screening     Status: None   Collection Time: 12/31/16 12:50 AM  Result Value Ref Range Status   MRSA by PCR NEGATIVE NEGATIVE Final    Comment:        The GeneXpert MRSA Assay (FDA approved for NASAL specimens only), is one component of a comprehensive MRSA colonization surveillance program. It is not intended to diagnose MRSA infection nor to guide or monitor treatment for MRSA infections.     Coagulation Studies: No results for input(s): LABPROT, INR in the last 72 hours.  Urinalysis: No results for input(s): COLORURINE, LABSPEC, PHURINE, GLUCOSEU, HGBUR, BILIRUBINUR, KETONESUR, PROTEINUR, UROBILINOGEN, NITRITE, LEUKOCYTESUR in the last 72 hours.  Invalid input(s): APPERANCEUR    Imaging: Dg Chest 1 View  Result Date: 01/02/2017 CLINICAL DATA:  Pneumonia.  Cough and congestion. EXAM: CHEST 1 VIEW COMPARISON:   01/01/2017 FINDINGS: Persistent densities at the right lung base with volume loss. No evidence for pulmonary edema. Heart size is upper limits of normal but unchanged. Mild blunting at the left costophrenic angle. Atherosclerotic calcifications at the aortic arch. Trachea is midline. IMPRESSION: Persistent densities and volume loss at the right lung base. Findings may represent a combination of consolidation and pleural fluid. Suspect a small left pleural effusion. Electronically Signed   By: Markus Daft M.D.   On: 01/02/2017 10:49   Dg Chest 2 View  Result Date: 01/01/2017 CLINICAL DATA:  Cough and weakness today. EXAM: CHEST  2 VIEW COMPARISON:  12/30/2016, 12/04/2016 and 09/15/2016 FINDINGS: Lungs are adequately inflated demonstrate persistent opacification over the right mid to lower lung compatible with moderate size effusion likely with associated basilar atelectasis. Cannot exclude infection in the right base. Mild heterogeneous opacification in the left base/ retrocardiac region with nodularity. Small amount left pleural fluid seen posteriorly on the lateral film. Stable cardiomegaly. Calcified plaque over the aortic arch. Degenerative change of the spine. IMPRESSION: Moderate size right pleural effusion likely with associated basilar atelectasis. Small amount left pleural fluid. Mild heterogeneous opacification with nodularity over the left base/retrocardiac region as cannot exclude infection in the lung bases. Stable cardiomegaly. Aortic Atherosclerosis (ICD10-I70.0). Electronically Signed   By: Marin Olp M.D.   On: 01/01/2017 20:35     Medications:   . sodium chloride    . heparin 1,550 Units/hr (01/03/17 1058)  . [START ON 01/13/2017] vancomycin     . amiodarone  400 mg Oral Daily  . aspirin EC  81 mg Oral Daily  . atorvastatin  20 mg Oral QHS  . budesonide (PULMICORT) nebulizer solution  0.5 mg Nebulization BID  . calcium acetate  667 mg Oral TID WC  . carvedilol  6.25 mg Oral BID   . doxazosin  8 mg Oral QPM  . feeding supplement (NEPRO CARB STEADY)  237 mL Oral BID BM  . furosemide  40 mg Oral BID  . insulin aspart  0-5 Units Subcutaneous QHS  . insulin aspart  0-9 Units Subcutaneous TID WC  . insulin aspart  0-9 Units Subcutaneous TID WC  . insulin aspart  3 Units Subcutaneous TID WC  . insulin detemir  20 Units Subcutaneous QHS  . ipratropium-albuterol  3 mL Nebulization  Q6H  . isosorbide mononitrate  60 mg Oral Daily  . [START ON 01/04/2017] levofloxacin  500 mg Oral Q48H  . lisinopril  20 mg Oral QPM  . pantoprazole  40 mg Oral Daily  . sodium chloride flush  3 mL Intravenous Q12H  . spironolactone  25 mg Oral BID   sodium chloride, acetaminophen **OR** acetaminophen, benzonatate, fentaNYL (SUBLIMAZE) injection, guaiFENesin-codeine, HYDROcodone-acetaminophen, lidocaine-prilocaine, senna-docusate, sodium chloride flush, traMADol  Assessment/ Plan:  76 y.o. male with diabetes mellitus type II insulin dependent, hypertension, coronary artery disease, hyperlipidemia, prostate cancer status post prostatectomy, right total knee, appendectomy.   MWF [facility]  1. End Stage Renal Disease MWF:  - Next HD planned for SAT and MON UF goal 1.5-2 L   2. Anemia of chronic kidney disease/Acute GI bleed 11/26/16.  Hemoglobin 10.1 Continue Procrit with dialysis  3. 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  - Management as per hospitalist.    4. Secondary Hyperparathyroidism:  - monitor phosphorus during hospitalization  5. A Fib with RVR:  It is a recurrent problem Cardiology evaluation ongoing Pleurodesis planned for monday   LOS: [place] 10/5/20184:08 PM  [place], [place]

## 2017-01-03 NOTE — Progress Notes (Signed)
Inpatient Diabetes Program Recommendations  AACE/ADA: New Consensus Statement on Inpatient Glycemic Control (2015)  Target Ranges:  Prepandial:   less than 140 mg/dL      Peak postprandial:   less than 180 mg/dL (1-2 hours)      Critically ill patients:  140 - 180 mg/dL   Results for Calvin Good, Calvin Good (MRN [id number]) as of 01/03/2017 08:20  Ref. Range 01/02/2017 07:59 01/02/2017 17:04 01/02/2017 20:37 01/03/2017 07:41  Glucose-Capillary Latest Ref Range: 65 - 99 mg/dL 349 (H) 288 (H) 236 (H) 193 (H)   Review of Glycemic Control  Diabetes history: DM2 Outpatient Diabetes medications: Levemir 30 units QHS, Humalog 10 units TID with meals Current orders for Inpatient glycemic control: Levemir 20 units QHS, Novolog 0-9 units TID with meals  Inpatient Diabetes Program Recommendations:  Correction (SSI): Please consider ordering Novolog 0-5 units QHS for bedtime correction scale. Insulin - Meal Coverage: If post prandial continues to be consistently elevated, please consider ordering Novolog 3 units TID with meals for meal coverage.  NOTE: Noted steroids were discontinued and last given on 01/01/17.  Thanks, Barnie Alderman, RN, MSN, CDE Diabetes Coordinator Inpatient Diabetes Program [phone] (Team Pager from 8am to 5pm)

## 2017-01-03 NOTE — Plan of Care (Signed)
Problem: Pain Managment: Goal: General experience of comfort will improve Outcome: Progressing States feels much better today overall than he did yesterday. Will continue to monitor. Wenda Low [facility]

## 2017-01-03 NOTE — Progress Notes (Signed)
ANTICOAGULATION CONSULT NOTE -  Follow UP   Pharmacy Consult for heparin drip Indication: chest pain/ACS/elevated troponin  Allergies  Allergen Reactions  . Tetracyclines & Related Other (See Comments)    Reaction: Unknown     Patient Measurements: Height: 5\' 8"  (172.7 cm) Weight: 226 lb 6.4 oz (102.7 kg) IBW/kg (Calculated) : 68.4 Heparin Dosing Weight: 88 kg  Vital Signs: Temp: 98.2 F (36.8 C) (10/05 1148) Temp Source: Oral (10/05 1148) BP: 117/95 (10/05 1148) Pulse Rate: 70 (10/05 1148)  Labs:  Recent Labs  01/01/17 0914  01/02/17 1142 01/03/17 0605 01/03/17 1450  HGB 9.8*  --  9.4* 10.1*  --   HCT 28.6*  --  27.5* 28.9*  --   PLT 118*  --  112* 125*  --   HEPARINUNFRC 0.32  < > 0.38 0.26* 0.32  CREATININE 6.39*  --   --  6.26*  --   < > = values in this interval not displayed.  Estimated Creatinine Clearance: 11.3 mL/min (A) (by C-G formula based on SCr of 6.26 mg/dL (H)).   Medical History: Past Medical History:  Diagnosis Date  . Cancer [facility])    Prostate  . CHF (congestive heart failure) ([place])   . Chronic kidney disease   . Complication of anesthesia    hard to wake up  . COPD (chronic obstructive pulmonary disease) ([place])   . Diabetes mellitus without complication ([place])   . Difficult intubation   . Dyspnea   . GERD (gastroesophageal reflux disease)   . Hyperlipidemia   . Hypertension   . Pleural effusion   . Renal insufficiency     Medications:  No anticoagulation in PTA meds  Assessment:  Goal of Therapy:  Heparin level 0.3-0.7 units/ml Monitor platelets by anticoagulation protocol: Yes   Plan:  Heparin level resulted @ 0.34. Will continue current rate of heparin. Will recheck heparin level @ [place] 01/03/2017,3:44 PM   10/02 2300 heparin level 0.22. 1300 unit bolus and increase rate to 1350 units/hr. Recheck in 8 hours.  10/03 Heparin level resulted @ 0.32. Will continue current heparin gtt rate and will recheck  heparin level  In 8 hours.   10/03 1859 HL therapeutic x 2. Continue current rate. Pharmacy will continue to monitor daily HL and CBC.  10/4: Heparin level therapeutic. Recheck Heparin level with am labs.   10/05 AM heparin level 0.26. 1300 unit bolus and increase rate to 1550 units/hr. Recheck in 8 hours.  10/5 1450 HL therapeutic x 1. Continue current rate. Will recheck HL in 8 hours.  Cherl Gorney A. Jordan Hawks, PharmD, BCPS  01/03/17 3:44 PM

## 2017-01-03 NOTE — Progress Notes (Signed)
ANTICOAGULATION CONSULT NOTE -  Follow UP   Pharmacy Consult for heparin drip Indication: chest pain/ACS/elevated troponin  Allergies  Allergen Reactions  . Tetracyclines & Related Other (See Comments)    Reaction: Unknown     Patient Measurements: Height: 5\' 8"  (172.7 cm) Weight: 226 lb 6.4 oz (102.7 kg) IBW/kg (Calculated) : 68.4 Heparin Dosing Weight: 88 kg  Vital Signs: Temp: 98 F (36.7 C) (10/05 0359) Temp Source: Oral (10/05 0359) BP: 132/38 (10/05 0359) Pulse Rate: 69 (10/05 0359)  Labs:  Recent Labs  12/31/16 0941  01/01/17 0914 01/01/17 1859 01/02/17 1142 01/03/17 0605  HGB  --   < > 9.8*  --  9.4* 10.1*  HCT  --   --  28.6*  --  27.5* 28.9*  PLT  --   --  118*  --  112* 125*  HEPARINUNFRC  --   < > 0.32 0.34 0.38 0.26*  CREATININE  --   --  6.39*  --   --  6.26*  TROPONINI 0.56*  --   --   --   --   --   < > = values in this interval not displayed.  Estimated Creatinine Clearance: 11.3 mL/min (A) (by C-G formula based on SCr of 6.26 mg/dL (H)).   Medical History: Past Medical History:  Diagnosis Date  . Cancer [facility])    Prostate  . CHF (congestive heart failure) ([place])   . Chronic kidney disease   . Complication of anesthesia    hard to wake up  . COPD (chronic obstructive pulmonary disease) ([place])   . Diabetes mellitus without complication ()   . Difficult intubation   . Dyspnea   . GERD (gastroesophageal reflux disease)   . Hyperlipidemia   . Hypertension   . Pleural effusion   . Renal insufficiency     Medications:  No anticoagulation in PTA meds  Assessment:  Goal of Therapy:  Heparin level 0.3-0.7 units/ml Monitor platelets by anticoagulation protocol: Yes   Plan:  Heparin level resulted @ 0.34. Will continue current rate of heparin. Will recheck heparin level @ 2200  Ayushi Pla S 01/03/2017,7:09 AM   10/02 2300 heparin level 0.22. 1300 unit bolus and increase rate to 1350 units/hr. Recheck in 8 hours.  10/03 Heparin  level resulted @ 0.32. Will continue current heparin gtt rate and will recheck heparin level  In 8 hours.   10/03 1859 HL therapeutic x 2. Continue current rate. Pharmacy will continue to monitor daily HL and CBC.  10/4: Heparin level therapeutic. Recheck Heparin level with am labs.   10/05 AM heparin level 0.26. 1300 unit bolus and increase rate to 1550 units/hr. Recheck in 8 hours.  Sim Boast, PharmD, BCPS  01/03/17 7:10 AM

## 2017-01-03 NOTE — Progress Notes (Signed)
Pharmacy Antibiotic Note  Calvin Good is a 76 y.o. male with pneumonia .  Pharmacy has been consulted for levofloxacin dosing.  Plan: Levofloxacin 500 mg PO q48 hours.   Height: 5\' 8"  (172.7 cm) Weight: 226 lb 6.4 oz (102.7 kg) IBW/kg (Calculated) : 68.4  Temp (24hrs), Avg:97.8 F (36.6 C), Min:97.5 F (36.4 C), Max:98.1 F (36.7 C)   Recent Labs Lab 12/30/16 1624 12/30/16 2036 12/31/16 0151 01/01/17 0914 01/02/17 1142 01/03/17 0605  WBC 10.3  --  9.8 8.4 9.2 9.8  CREATININE 3.62*  --  4.52* 6.39*  --  6.26*  LATICACIDVEN 1.2 1.1  --   --   --   --     Estimated Creatinine Clearance: 11.3 mL/min (A) (by C-G formula based on SCr of 6.26 mg/dL (H)).    Allergies  Allergen Reactions  . Tetracyclines & Related Other (See Comments)    Reaction: Unknown     Antimicrobials this admission: Levofloxacin 10/4 >>    >>   Dose adjustments this admission:   Microbiology results:  BCx:   UCx:    Sputum:    MRSA PCR:   Thank you for allowing pharmacy to be a part of this patient's care.  Lovelace Cerveny D 01/03/2017 10:17 AM

## 2017-01-03 NOTE — Progress Notes (Signed)
[facility] [place] NAME: Ved Martos    MR#:  [id number]  DATE OF BIRTH:  [birth year]  SUBJECTIVE:   Got some sleep but still with cough Tessalon Perles and cough syrup seem to help.  REVIEW OF SYSTEMS:    Review of Systems  Constitutional: Negative for fever, chills weight loss HENT: Negative for ear pain, nosebleeds, congestion, facial swelling, rhinorrhea, neck pain, neck stiffness and ear discharge.   Respiratory: Positive for cough and shortness of breath no wheezing Cardiovascular: Negative for chest pain, palpitations and leg swelling.  Gastrointestinal: Negative for heartburn, abdominal pain, vomiting, diarrhea or consitpation Genitourinary: Negative for dysuria, urgency, frequency, hematuria Musculoskeletal: Negative for back pain or joint pain Neurological: Negative for dizziness, seizures, syncope, focal weakness,  numbness and headaches.  Hematological: Does not bruise/bleed easily.  Psychiatric/Behavioral: Negative for hallucinations, confusion, dysphoric mood    Tolerating Diet: yes      DRUG ALLERGIES:   Allergies  Allergen Reactions  . Tetracyclines & Related Other (See Comments)    Reaction: Unknown     VITALS:  Blood pressure (!) 132/38, pulse 69, temperature 98 F (36.7 C), temperature source Oral, resp. rate 18, height 5\' 8"  (1.727 m), weight 102.7 kg (226 lb 6.4 oz), SpO2 96 %.  PHYSICAL EXAMINATION:  Constitutional: Appears well-developed and well-nourished. No distress. HENT: Normocephalic. Marland Kitchen Oropharynx is clear and moist.  Eyes: Conjunctivae and EOM are normal. PERRLA, no scleral icterus.  Neck: Normal ROM. Neck supple. No JVD. No tracheal deviation. CVS RRR S1/S2 +, 2/6 SEM NO gallops, no carotid bruit.  Pulmonary: Decreased breath sounds at right base NO crackles or wheezing  Abdominal: Soft. BS +,  no distension, tenderness, rebound or guarding.  Musculoskeletal: Normal range of motion. No  edema and no tenderness.  Neuro: Alert. CN 2-12 grossly intact. No focal deficits. Skin: Skin is warm and dry. No rash noted. Psychiatric: Normal mood and affect.      LABORATORY PANEL:   CBC  Recent Labs Lab 01/03/17 0605  WBC 9.8  HGB 10.1*  HCT 28.9*  PLT 125*   ------------------------------------------------------------------------------------------------------------------  Chemistries   Recent Labs Lab 01/03/17 0605  NA 131*  K 4.4  CL 93*  CO2 26  GLUCOSE 223*  BUN 62*  CREATININE 6.26*  CALCIUM 8.4*   ------------------------------------------------------------------------------------------------------------------  Cardiac Enzymes  Recent Labs Lab 12/30/16 2036 12/31/16 0151 12/31/16 0941  TROPONINI 0.11* 0.53* 0.56*   ------------------------------------------------------------------------------------------------------------------  RADIOLOGY:  Dg Chest 1 View  Result Date: 01/02/2017 CLINICAL DATA:  Pneumonia.  Cough and congestion. EXAM: CHEST 1 VIEW COMPARISON:  01/01/2017 FINDINGS: Persistent densities at the right lung base with volume loss. No evidence for pulmonary edema. Heart size is upper limits of normal but unchanged. Mild blunting at the left costophrenic angle. Atherosclerotic calcifications at the aortic arch. Trachea is midline. IMPRESSION: Persistent densities and volume loss at the right lung base. Findings may represent a combination of consolidation and pleural fluid. Suspect a small left pleural effusion. Electronically Signed   By: Markus Daft M.D.   On: 01/02/2017 10:49   Dg Chest 2 View  Result Date: 01/01/2017 CLINICAL DATA:  Cough and weakness today. EXAM: CHEST  2 VIEW COMPARISON:  12/30/2016, 12/04/2016 and 09/15/2016 FINDINGS: Lungs are adequately inflated demonstrate persistent opacification over the right mid to lower lung compatible with moderate size effusion likely with associated basilar atelectasis. Cannot exclude  infection in the right base. Mild heterogeneous opacification in the left base/ retrocardiac  region with nodularity. Small amount left pleural fluid seen posteriorly on the lateral film. Stable cardiomegaly. Calcified plaque over the aortic arch. Degenerative change of the spine. IMPRESSION: Moderate size right pleural effusion likely with associated basilar atelectasis. Small amount left pleural fluid. Mild heterogeneous opacification with nodularity over the left base/retrocardiac region as cannot exclude infection in the lung bases. Stable cardiomegaly. Aortic Atherosclerosis (ICD10-I70.0). Electronically Signed   By: Marin Olp M.D.   On: 01/01/2017 20:35     ASSESSMENT AND PLAN:   76 year old male with end-stage renal disease on hemodialysis, chronic diastolic heart failure and PAF who presented with chest pain and found to have atrial fibrillation with RVR.  1. Atrial fibrillation with RVR: Patient is now normal sinus rhythm with improved heart rates Continue amiodarone 400 mg daily and Coreg He will need anticoagulation prior to discharge CHADSVASC score at least 4  2. Chest pain on admission: This was due to atrial fibrillation with RVR. Patient was ruled out for acute coronary syndrome.  3. End-stage renal disease on hemodialysis: Patient will continue dialysis on scheduled days as per NEPHROLOGY.  4. Acute on chronic diastolic heart failure:Patient is EUVOLEMIC Continue Coreg, Lasix and lisinopril  5. Moderate right pleural effusion: Plan is for talc procedure on Monday with Dr.Oaks at 1:00 p.m. Patient will stay in the hospital until then. Patient may have underlying pneumonia and therefore is started on Levaquin, empirically. Today is day 2 of 5 of Levaquin.   6. 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  7. Diabetes: Continue Levemir and sliding scale with ADA diet I will add NovoLog as per recommendations by diabetes coordinator.   8.  Hyponatremia: sodium level improving and will repeat BMP in a.m.   Management plans discussed with the patient and he is in agreement.  CODE STATUS: full  TOTAL TIME TAKING CARE OF THIS PATIENT: 24 minutes.     POSSIBLE D/C tuesday, DEPENDING ON CLINICAL CONDITION.   Chandani Rogowski M.D on 01/03/2017 at 8:53 AM  Between 7am to 6pm - Pager - [phone] After 6pm go to [email or web address] - password [email or web address] [facility]  Office  [phone]  CC: Primary care physician; [name], MD  Note: This dictation was prepared with Dragon dictation along with smaller phrase technology. Any transcriptional errors that result from this process are unintentional.

## 2017-01-04 LAB — GLUCOSE, CAPILLARY
GLUCOSE-CAPILLARY: 251 mg/dL — AB (ref 65–99)
GLUCOSE-CAPILLARY: 168 mg/dL — AB (ref 65–99)
GLUCOSE-CAPILLARY: 197 mg/dL — AB (ref 65–99)
Glucose-Capillary: 212 mg/dL — ABNORMAL HIGH (ref 65–99)

## 2017-01-04 LAB — CBC
HCT: 28.4 % — ABNORMAL LOW (ref 40.0–52.0)
HEMOGLOBIN: 9.7 g/dL — AB (ref 13.0–18.0)
MCH: 29.4 pg (ref 26.0–34.0)
MCHC: 34.2 g/dL (ref 32.0–36.0)
MCV: 85.9 fL (ref 80.0–100.0)
Platelets: 109 10*3/uL — ABNORMAL LOW (ref 150–440)
RBC: 3.31 MIL/uL — ABNORMAL LOW (ref 4.40–5.90)
RDW: 15.3 % — ABNORMAL HIGH (ref 11.5–14.5)
WBC: 7.3 10*3/uL (ref 3.8–10.6)

## 2017-01-04 LAB — HEPARIN LEVEL (UNFRACTIONATED)
HEPARIN UNFRACTIONATED: 0.55 [IU]/mL (ref 0.30–0.70)
HEPARIN UNFRACTIONATED: 0.48 [IU]/mL (ref 0.30–0.70)
Heparin Unfractionated: 0.19 IU/mL — ABNORMAL LOW (ref 0.30–0.70)

## 2017-01-04 LAB — BASIC METABOLIC PANEL
ANION GAP: 14 (ref 5–15)
BUN: 81 mg/dL — AB (ref 6–20)
CHLORIDE: 91 mmol/L — AB (ref 101–111)
CO2: 25 mmol/L (ref 22–32)
Calcium: 8.2 mg/dL — ABNORMAL LOW (ref 8.9–10.3)
Creatinine, Ser: 7.26 mg/dL — ABNORMAL HIGH (ref 0.61–1.24)
GFR calc Af Amer: 7 mL/min — ABNORMAL LOW (ref >60)
GFR, EST NON AFRICAN AMERICAN: 6 mL/min — AB (ref >60)
GLUCOSE: 212 mg/dL — AB (ref 65–99)
POTASSIUM: 4.2 mmol/L (ref 3.5–5.1)
Sodium: 130 mmol/L — ABNORMAL LOW (ref 135–145)

## 2017-01-04 LAB — CULTURE, BLOOD (ROUTINE X 2)
CULTURE: NO GROWTH
Culture: NO GROWTH
SPECIAL REQUESTS: ADEQUATE
Special Requests: ADEQUATE

## 2017-01-04 MED ORDER — HEPARIN BOLUS VIA INFUSION
2600.0000 [IU] | Freq: Once | INTRAVENOUS | Status: AC
  Administered 2017-01-04: 2600 [IU] via INTRAVENOUS
  Filled 2017-01-04: qty 2600

## 2017-01-04 MED ORDER — SALINE SPRAY 0.65 % NA SOLN
2.0000 | Freq: Four times a day (QID) | NASAL | Status: DC | PRN
  Administered 2017-01-04: 2 via NASAL
  Filled 2017-01-04 (×2): qty 44

## 2017-01-04 NOTE — Progress Notes (Signed)
[facility] [place] NAME: Calvin Good    MR#:  [id number]  DATE OF BIRTH:  08/31/1940  SUBJECTIVE:  CHIEF COMPLAINT:   Chief Complaint  Patient presents with  . Chest Pain   - Feels short of breath on laying flat. Has recurrent right pleural effusion. For pleurodesis on Monday -Still complains of cough and dyspnea. -For dialysis today as dialysis was missed yesterday  REVIEW OF SYSTEMS:  Review of Systems  Constitutional: Positive for malaise/fatigue. Negative for chills and fever.  HENT: Negative for congestion, ear discharge, hearing loss and nosebleeds.   Eyes: Negative for blurred vision and double vision.  Respiratory: Positive for shortness of breath. Negative for cough and wheezing.   Cardiovascular: Positive for orthopnea. Negative for chest pain and palpitations.  Gastrointestinal: Negative for abdominal pain, constipation, diarrhea, nausea and vomiting.  Genitourinary: Negative for dysuria.  Musculoskeletal: Negative for myalgias.  Neurological: Negative for dizziness, speech change, focal weakness, seizures and headaches.  Psychiatric/Behavioral: Negative for depression.    DRUG ALLERGIES:   Allergies  Allergen Reactions  . Tetracyclines & Related Other (See Comments)    Reaction: Unknown     VITALS:  Blood pressure (!) 148/42, pulse 70, temperature 97.6 F (36.4 C), temperature source Oral, resp. rate 18, height 5\' 8"  (1.727 m), weight 102.6 kg (226 lb 3.2 oz), SpO2 96 %.  PHYSICAL EXAMINATION:  Physical Exam  GENERAL:  76 y.o.-year-old patient lying in the bed, Appears short of breath with minimal exertion EYES: Pupils equal, round, reactive to light and accommodation. No scleral icterus. Extraocular muscles intact.  HEENT: Head atraumatic, normocephalic. Oropharynx and nasopharynx clear.  NECK:  Supple, no jugular venous distention. No thyroid enlargement, no tenderness.  LUNGS: Normal breath sounds  bilaterally, no wheezing, rales,rhonchi or crepitation. No use of accessory muscles of respiration. Decreased bibasilar breath sounds CARDIOVASCULAR: S1, S2 normal. No  rubs, or gallops. 2/6 systolic murmur is present ABDOMEN: Soft, nontender, nondistended. Bowel sounds present. No organomegaly or mass.  EXTREMITIES: No pedal edema, cyanosis, or clubbing. Left arm AV fistula present NEUROLOGIC: Cranial nerves II through XII are intact. Muscle strength 5/5 in all extremities. Sensation intact. Gait not checked.  PSYCHIATRIC: The patient is alert and oriented x 2-3.  SKIN: No obvious rash, lesion, or ulcer.    LABORATORY PANEL:   CBC  Recent Labs Lab 01/04/17 0848  WBC 7.3  HGB 9.7*  HCT 28.4*  PLT 109*   ------------------------------------------------------------------------------------------------------------------  Chemistries   Recent Labs Lab 01/03/17 2341  NA 130*  K 4.2  CL 91*  CO2 25  GLUCOSE 212*  BUN 81*  CREATININE 7.26*  CALCIUM 8.2*   ------------------------------------------------------------------------------------------------------------------  Cardiac Enzymes  Recent Labs Lab 12/31/16 0941  TROPONINI 0.56*   ------------------------------------------------------------------------------------------------------------------  RADIOLOGY:  No results found.  EKG:   Orders placed or performed during the hospital encounter of 12/30/16  . ED EKG within 10 minutes  . ED EKG within 10 minutes    ASSESSMENT AND PLAN:   76 year old male with past medical history significant for end-stage renal disease on hemodialysis, congestive heart failure, COPD on 2L home oxygen, diabetes and hypertension presents to hospital secondary to worsening dyspnea.  #1 paroxysmal atrial fibrillation with rapid ventricular response- -received amiodarone drip, rate is better controlled. -On oral amiodarone at this time and also carvedilol. -Was not on anticoagulation  as outpatient due to GI bleed. Currently on heparin drip  #2 acute on chronic combined CHF, last EF  of 50-55%. -Appreciate cardiology consult. Continue oral Lasix at this time. Appears well compensated -History of recurrent right pleural effusion. Appreciate thoraco-surgical consult. -We'll have talc pleurodesis done on Monday -Empirically on Levaquin for right lower lobe infiltrate.  #3diabetes mellitus-continue Levemir and sliding scale insulin. Also on aspart insulin prior to meals.  #4 end-stage renal disease on hemodialysis-appreciate nephrology input.  On Monday Wednesday Friday dialysis. Dialysis today as has missed yesterday. Next dialysis on Monday.  #5 COPD-stable at this time. On 3L home oxygen -Continue inhalers. Continue outpatient pulmonary follow-up  #6  CAD-with chronic stable angina. Status post cardiac catheterization in May 2018 with diffuse RCA disease and collaterals LAD, left circumflex 75% stenosis. Troponins were elevated on admission. Cardiology recommended medical management. -Continue Imdur  #7 DVT prophylaxis-on heparin drip at this time     All the records are reviewed and case discussed with Care Management/Social Workerr. Management plans discussed with the patient, family and they are in agreement.  CODE STATUS: Full code  TOTAL TIME TAKING CARE OF THIS PATIENT: 37 minutes.   POSSIBLE D/C IN 2 DAYS, DEPENDING ON CLINICAL CONDITION.   Dempsy Damiano M.D on 01/04/2017 at 10:59 AM  Between 7am to 6pm - Pager - [phone]  After 6pm go to [email or web address] - password [email or web address] [facility]  Office  [phone]  CC: Primary care physician; [name], MD

## 2017-01-04 NOTE — Progress Notes (Signed)
[facility], [place] 01/04/17  Subjective:   Patient known to our practice from outpatient dialysis.  He was sent from dialysis for chest pain and was noted to be in atrial fibrillation with RVR with heart rate of greater than 130   He is scheduled for a pleurodesis procedure on Monday. At present, his main concern is cough productive of clear to yellow sputum. Otherwise he has been able to eat okay. No complaints of acute shortness of breath. He does have some lower extremity edema   Objective:  Vital signs in last 24 hours:  Temp:  [97.6 F (36.4 C)-98.2 F (36.8 C)] 97.6 F (36.4 C) (10/06 0359) Pulse Rate:  [66-70] 70 (10/06 0824) Resp:  [14-18] 18 (10/06 0359) BP: (114-148)/(42-95) 148/42 (10/06 0824) SpO2:  [93 %-97 %] 96 % (10/06 0824) Weight:  [102.6 kg (226 lb 3.2 oz)] 102.6 kg (226 lb 3.2 oz) (10/06 0359)  Weight change: -0.091 kg (-3.2 oz) Filed Weights   01/02/17 0624 01/03/17 0356 01/04/17 0359  Weight: 99.8 kg (220 lb) 102.7 kg (226 lb 6.4 oz) 102.6 kg (226 lb 3.2 oz)    Intake/Output:    Intake/Output Summary (Last 24 hours) at 01/04/17 1015 Last data filed at 01/04/17 1008  Gross per 24 hour  Intake             1080 ml  Output                0 ml  Net             1080 ml     Physical Exam: General: Chronically ill-appearing,   HEENT moist oral mucus membranes  Neck supple  Pulm/lungs Coarse crackles at rt base, Mild scattered wheezing   CVS/Heart Irregular  Abdomen:  Soft, nontender  Extremities: + peripheral edema  Neurologic: Alert and oriented  Skin: No acute rashes  access Left arm AV fistula       Basic Metabolic Panel:    Recent Labs Lab 12/30/16 1624 12/31/16 0151 01/01/17 0914 01/02/17 1142 01/03/17 0605 01/03/17 2341  NA 136 133* 128*  --  131* 130*  K 3.4* 3.9 4.0  --  4.4 4.2  CL 95* 94* 90*  --  93* 91*  CO2 29 28 26   --  26 25  GLUCOSE 221* 403* 270*  --  223* 212*  BUN 36* 44* 59*  --  62*  81*  CREATININE 3.62* 4.52* 6.39*  --  6.26* 7.26*  CALCIUM 8.6* 7.9* 8.1*  --  8.4* 8.2*  PHOS  --   --   --  5.0*  --   --      CBC:  Recent Labs Lab 01/01/17 0914 01/02/17 1142 01/03/17 0605 01/03/17 2341 01/04/17 0848  WBC 8.4 9.2 9.8 7.9 7.3  HGB 9.8* 9.4* 10.1* 9.1* 9.7*  HCT 28.6* 27.5* 28.9* 26.7* 28.4*  MCV 86.8 85.8 86.9 85.9 85.9  PLT 118* 112* 125* 122* 109*      Lab Results  Component Value Date   HEPBSAG Negative 11/27/2016      Microbiology:  Recent Results (from the past 240 hour(s))  Blood Culture (routine x 2)     Status: None (Preliminary result)   Collection Time: 12/30/16  4:28 PM  Result Value Ref Range Status   Specimen Description BLOOD RIGHT ANTECUBITAL  Final   Special Requests   Final    BOTTLES DRAWN AEROBIC AND ANAEROBIC Blood Culture adequate volume   Culture  NO GROWTH 4 DAYS  Final   Report Status PENDING  Incomplete  Blood Culture (routine x 2)     Status: None (Preliminary result)   Collection Time: 12/30/16  4:33 PM  Result Value Ref Range Status   Specimen Description BLOOD BLOOD RIGHT HAND  Final   Special Requests   Final    BOTTLES DRAWN AEROBIC AND ANAEROBIC Blood Culture adequate volume   Culture NO GROWTH 4 DAYS  Final   Report Status PENDING  Incomplete  MRSA PCR Screening     Status: None   Collection Time: 12/31/16 12:50 AM  Result Value Ref Range Status   MRSA by PCR NEGATIVE NEGATIVE Final    Comment:        The GeneXpert MRSA Assay (FDA approved for NASAL specimens only), is one component of a comprehensive MRSA colonization surveillance program. It is not intended to diagnose MRSA infection nor to guide or monitor treatment for MRSA infections.     Coagulation Studies: No results for input(s): LABPROT, INR in the last 72 hours.  Urinalysis: No results for input(s): COLORURINE, LABSPEC, PHURINE, GLUCOSEU, HGBUR, BILIRUBINUR, KETONESUR, PROTEINUR, UROBILINOGEN, NITRITE, LEUKOCYTESUR in the last 72  hours.  Invalid input(s): APPERANCEUR    Imaging: Dg Chest 1 View  Result Date: 01/02/2017 CLINICAL DATA:  Pneumonia.  Cough and congestion. EXAM: CHEST 1 VIEW COMPARISON:  01/01/2017 FINDINGS: Persistent densities at the right lung base with volume loss. No evidence for pulmonary edema. Heart size is upper limits of normal but unchanged. Mild blunting at the left costophrenic angle. Atherosclerotic calcifications at the aortic arch. Trachea is midline. IMPRESSION: Persistent densities and volume loss at the right lung base. Findings may represent a combination of consolidation and pleural fluid. Suspect a small left pleural effusion. Electronically Signed   By: Markus Daft M.D.   On: 01/02/2017 10:49     Medications:   . sodium chloride    . heparin 1,750 Units/hr (01/04/17 0351)  . [START ON 01/13/2017] vancomycin     . amiodarone  400 mg Oral Daily  . aspirin EC  81 mg Oral Daily  . atorvastatin  20 mg Oral QHS  . budesonide (PULMICORT) nebulizer solution  0.5 mg Nebulization BID  . calcium acetate  667 mg Oral TID WC  . carvedilol  6.25 mg Oral BID  . doxazosin  8 mg Oral QPM  . feeding supplement (NEPRO CARB STEADY)  237 mL Oral BID BM  . furosemide  40 mg Oral BID  . insulin aspart  0-5 Units Subcutaneous QHS  . insulin aspart  0-9 Units Subcutaneous TID WC  . insulin aspart  0-9 Units Subcutaneous TID WC  . insulin aspart  3 Units Subcutaneous TID WC  . insulin detemir  20 Units Subcutaneous QHS  . ipratropium-albuterol  3 mL Nebulization Q6H  . isosorbide mononitrate  60 mg Oral Daily  . levofloxacin  500 mg Oral Q48H  . lisinopril  20 mg Oral QPM  . pantoprazole  40 mg Oral Daily  . sodium chloride flush  3 mL Intravenous Q12H  . spironolactone  25 mg Oral BID   sodium chloride, acetaminophen **OR** acetaminophen, benzonatate, fentaNYL (SUBLIMAZE) injection, guaiFENesin-codeine, HYDROcodone-acetaminophen, lidocaine-prilocaine, senna-docusate, sodium chloride flush,  traMADol  Assessment/ Plan:  76 y.o. male with diabetes mellitus type II insulin dependent, hypertension, coronary artery disease, hyperlipidemia, prostate cancer status post prostatectomy, right total knee, appendectomy.   MWF [facility] [place] AVF  1. End Stage Renal Disease MWF:  -  Hemodialysis planned for today and then Monday morning  UF goal 1.5-2 L  as tolerated  2. Anemia of chronic kidney disease/Acute GI bleed 11/26/16.  Hemoglobin 9.7 Continue Procrit with dialysis  3. 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  - Management as per hospitalist.    4. Secondary Hyperparathyroidism:  - monitor phosphorus during hospitalization  5. A Fib with RVR:  It is a recurrent problem Cardiology evaluation ongoing Talc Pleurodesis planned for monday   LOS: [place] 10/6/201810:15 [place], [place]

## 2017-01-04 NOTE — Progress Notes (Addendum)
ANTICOAGULATION CONSULT NOTE -  Follow UP   Pharmacy Consult for heparin drip Indication: chest pain/ACS/elevated troponin  Allergies  Allergen Reactions  . Tetracyclines & Related Other (See Comments)    Reaction: Unknown     Patient Measurements: Height: 5\' 8"  (172.7 cm) Weight: 226 lb 3.2 oz (102.6 kg) IBW/kg (Calculated) : 68.4 Heparin Dosing Weight: 88 kg  Vital Signs: Temp: 97.6 F (36.4 C) (10/06 1441) Temp Source: Oral (10/06 1441) BP: 147/52 (10/06 1743) Pulse Rate: 69 (10/06 1738)  Labs:  Recent Labs  01/03/17 0605  01/03/17 2341 01/04/17 0848 01/04/17 1659  HGB 10.1*  --  9.1* 9.7*  --   HCT 28.9*  --  26.7* 28.4*  --   PLT 125*  --  122* 109*  --   HEPARINUNFRC 0.26*  < > 0.19* 0.55 0.48  CREATININE 6.26*  --  7.26*  --   --   < > = values in this interval not displayed.  Estimated Creatinine Clearance: 9.7 mL/min (A) (by C-G formula based on SCr of 7.26 mg/dL (H)).   Medical History: Past Medical History:  Diagnosis Date  . Cancer [facility])    Prostate  . CHF (congestive heart failure) ([place])   . Chronic kidney disease   . Complication of anesthesia    hard to wake up  . COPD (chronic obstructive pulmonary disease) ([place])   . Diabetes mellitus without complication ([place])   . Difficult intubation   . Dyspnea   . GERD (gastroesophageal reflux disease)   . Hyperlipidemia   . Hypertension   . Pleural effusion   . Renal insufficiency     Medications:  No anticoagulation in PTA meds  Assessment:  Goal of Therapy:  Heparin level 0.3-0.7 units/ml Monitor platelets by anticoagulation protocol: Yes   Plan:  Continue heparin at 1750 units/hr and recheck HL in 8 hours.   10/6 1659 HL therapeutic x 1. Continue current rate. Pharmacy will continue to monitor HL and CBC daily.  Laural Benes, Pharm.D., BCPS Clinical Pharmacist 01/04/2017,5:51 PM    10/07 AM heparin level 0.51. Continue current regimen. Recheck heparin level and CBC with  tomorrow AM labs.  Sim Boast, PharmD, BCPS  01/05/17 6:26 AM

## 2017-01-04 NOTE — Progress Notes (Signed)
Pt's daughter is unable to be here, and requests MD call her with an update after rounding tomorrow, 01/05/17.

## 2017-01-04 NOTE — Progress Notes (Signed)
ANTICOAGULATION CONSULT NOTE -  Follow UP   Pharmacy Consult for heparin drip Indication: chest pain/ACS/elevated troponin  Allergies  Allergen Reactions  . Tetracyclines & Related Other (See Comments)    Reaction: Unknown     Patient Measurements: Height: 5\' 8"  (172.7 cm) Weight: 226 lb 3.2 oz (102.6 kg) IBW/kg (Calculated) : 68.4 Heparin Dosing Weight: 88 kg  Vital Signs: Temp: 97.6 F (36.4 C) (10/06 0359) Temp Source: Oral (10/06 0359) BP: 148/42 (10/06 0824) Pulse Rate: 70 (10/06 0824)  Labs:  Recent Labs  01/03/17 0605 01/03/17 1450 01/03/17 2341 01/04/17 0848  HGB 10.1*  --  9.1* 9.7*  HCT 28.9*  --  26.7* 28.4*  PLT 125*  --  122* 109*  HEPARINUNFRC 0.26* 0.32 0.19* 0.55  CREATININE 6.26*  --  7.26*  --     Estimated Creatinine Clearance: 9.7 mL/min (A) (by C-G formula based on SCr of 7.26 mg/dL (H)).   Medical History: Past Medical History:  Diagnosis Date  . Cancer [facility])    Prostate  . CHF (congestive heart failure) ([place])   . Chronic kidney disease   . Complication of anesthesia    hard to wake up  . COPD (chronic obstructive pulmonary disease) ([place])   . Diabetes mellitus without complication ([place])   . Difficult intubation   . Dyspnea   . GERD (gastroesophageal reflux disease)   . Hyperlipidemia   . Hypertension   . Pleural effusion   . Renal insufficiency     Medications:  No anticoagulation in PTA meds  Assessment:  Goal of Therapy:  Heparin level 0.3-0.7 units/ml Monitor platelets by anticoagulation protocol: Yes   Plan:  Continue heparin at 1750 units/hr and recheck HL in 8 hours.   [name] 01/04/2017,10:36 AM

## 2017-01-04 NOTE — Progress Notes (Signed)
There was difficulty in cannulating the AVF Dialysis treatment deferred til tomorrow

## 2017-01-04 NOTE — Progress Notes (Addendum)
  Amiodarone Drug - Drug Interaction Consult Note  Recommendations: Need to closely monitor QT while on Levaquin and amiodarone.   Amiodarone is metabolized by the cytochrome P450 system and therefore has the potential to cause many drug interactions. Amiodarone has an average plasma half-life of 50 days (range 20 to 100 days).   There is potential for drug interactions to occur several weeks or months after stopping treatment and the onset of drug interactions may be slow after initiating amiodarone.   []  Statins: Increased risk of myopathy. Simvastatin- restrict dose to 20mg  daily. Other statins: counsel patients to report any muscle pain or weakness immediately.  []  Anticoagulants: Amiodarone can increase anticoagulant effect. Consider warfarin dose reduction. Patients should be monitored closely and the dose of anticoagulant altered accordingly, remembering that amiodarone levels take several weeks to stabilize.  []  Antiepileptics: Amiodarone can increase plasma concentration of phenytoin, the dose should be reduced. Note that small changes in phenytoin dose can result in large changes in levels. Monitor patient and counsel on signs of toxicity.  []  Beta blockers: increased risk of bradycardia, AV block and myocardial depression. Sotalol - avoid concomitant use.  []   Calcium channel blockers (diltiazem and verapamil): increased risk of bradycardia, AV block and myocardial depression.  []   Cyclosporine: Amiodarone increases levels of cyclosporine. Reduced dose of cyclosporine is recommended.  []  Digoxin dose should be halved when amiodarone is started.  []  Diuretics: increased risk of cardiotoxicity if hypokalemia occurs.  []  Oral hypoglycemic agents (glyburide, glipizide, glimepiride): increased risk of hypoglycemia. Patient's glucose levels should be monitored closely when initiating amiodarone therapy.   [x]  Drugs that prolong the QT interval:  Torsades de pointes risk may be  increased with concurrent use - avoid if possible.  Monitor QTc, also keep magnesium/potassium WNL if concurrent therapy can't be avoided. Marland Kitchen Antibiotics: e.g. fluoroquinolones, erythromycin. . Antiarrhythmics: e.g. quinidine, procainamide, disopyramide, sotalol. . Antipsychotics: e.g. phenothiazines, haloperidol.  . Lithium, tricyclic antidepressants, and methadone. Thank You,  [name]  01/04/2017 10:48 AM

## 2017-01-04 NOTE — Progress Notes (Signed)
ANTICOAGULATION CONSULT NOTE -  Follow UP   Pharmacy Consult for heparin drip Indication: chest pain/ACS/elevated troponin  Allergies  Allergen Reactions  . Tetracyclines & Related Other (See Comments)    Reaction: Unknown     Patient Measurements: Height: 5\' 8"  (172.7 cm) Weight: 226 lb 6.4 oz (102.7 kg) IBW/kg (Calculated) : 68.4 Heparin Dosing Weight: 88 kg  Vital Signs: Temp: 98 F (36.7 C) (10/05 1928) Temp Source: Oral (10/05 1928) BP: 114/69 (10/05 1928) Pulse Rate: 70 (10/05 1928)  Labs:  Recent Labs  01/01/17 0914  01/02/17 1142 01/03/17 0605 01/03/17 1450 01/03/17 2341  HGB 9.8*  --  9.4* 10.1*  --  9.1*  HCT 28.6*  --  27.5* 28.9*  --  26.7*  PLT 118*  --  112* 125*  --  122*  HEPARINUNFRC 0.32  < > 0.38 0.26* 0.32 0.19*  CREATININE 6.39*  --   --  6.26*  --  7.26*  < > = values in this interval not displayed.  Estimated Creatinine Clearance: 9.7 mL/min (A) (by C-G formula based on SCr of 7.26 mg/dL (H)).   Medical History: Past Medical History:  Diagnosis Date  . Cancer [facility])    Prostate  . CHF (congestive heart failure) ([place])   . Chronic kidney disease   . Complication of anesthesia    hard to wake up  . COPD (chronic obstructive pulmonary disease) ([place])   . Diabetes mellitus without complication ([place])   . Difficult intubation   . Dyspnea   . GERD (gastroesophageal reflux disease)   . Hyperlipidemia   . Hypertension   . Pleural effusion   . Renal insufficiency     Medications:  No anticoagulation in PTA meds  Assessment:  Goal of Therapy:  Heparin level 0.3-0.7 units/ml Monitor platelets by anticoagulation protocol: Yes   Plan:  Heparin level resulted @ 0.34. Will continue current rate of heparin. Will recheck heparin level @ 2200  Rayburn Mundis S 01/04/2017,12:54 AM   10/02 2300 heparin level 0.22. 1300 unit bolus and increase rate to 1350 units/hr. Recheck in 8 hours.  10/03 Heparin level resulted @ 0.32. Will continue  current heparin gtt rate and will recheck heparin level  In 8 hours.   10/03 1859 HL therapeutic x 2. Continue current rate. Pharmacy will continue to monitor daily HL and CBC.  10/4: Heparin level therapeutic. Recheck Heparin level with am labs.   10/05 AM heparin level 0.26. 1300 unit bolus and increase rate to 1550 units/hr. Recheck in 8 hours.  10/5 1450 HL therapeutic x 1. Continue current rate. Will recheck HL in 8 hours.  10/50 PM heparin level 0.19. 2600 unit bolus and increase rate to 1750 units/hr. Recheck heparin level and CBC in 8 hours.  Nathan A. Jordan Hawks, PharmD, BCPS  01/04/17 12:54 AM

## 2017-01-05 LAB — HEPARIN LEVEL (UNFRACTIONATED): HEPARIN UNFRACTIONATED: 0.51 [IU]/mL (ref 0.30–0.70)

## 2017-01-05 LAB — GLUCOSE, CAPILLARY
Glucose-Capillary: 199 mg/dL — ABNORMAL HIGH (ref 65–99)
Glucose-Capillary: 172 mg/dL — ABNORMAL HIGH (ref 65–99)
Glucose-Capillary: 194 mg/dL — ABNORMAL HIGH (ref 65–99)

## 2017-01-05 LAB — CBC
HCT: 26.9 % — ABNORMAL LOW (ref 40.0–52.0)
HEMOGLOBIN: 9.1 g/dL — AB (ref 13.0–18.0)
MCH: 29.1 pg (ref 26.0–34.0)
MCHC: 34 g/dL (ref 32.0–36.0)
MCV: 85.7 fL (ref 80.0–100.0)
Platelets: 105 10*3/uL — ABNORMAL LOW (ref 150–440)
RBC: 3.14 MIL/uL — ABNORMAL LOW (ref 4.40–5.90)
RDW: 15.4 % — ABNORMAL HIGH (ref 11.5–14.5)
WBC: 7 10*3/uL (ref 3.8–10.6)

## 2017-01-05 MED ORDER — FLUTICASONE PROPIONATE 50 MCG/ACT NA SUSP
1.0000 | Freq: Every day | NASAL | Status: DC
  Administered 2017-01-05 – 2017-01-16 (×8): 1 via NASAL
  Filled 2017-01-05: qty 16

## 2017-01-05 MED ORDER — HYDROCOD POLST-CPM POLST ER 10-8 MG/5ML PO SUER
5.0000 mL | Freq: Two times a day (BID) | ORAL | Status: DC
  Administered 2017-01-05 – 2017-01-12 (×13): 5 mL via ORAL
  Filled 2017-01-05 (×13): qty 5

## 2017-01-05 MED ORDER — PHENYLEPHRINE HCL 0.25 % NA SOLN
1.0000 | Freq: Four times a day (QID) | NASAL | Status: DC | PRN
  Administered 2017-01-05: 1 via NASAL
  Filled 2017-01-05: qty 15

## 2017-01-05 MED ORDER — EPOETIN ALFA 10000 UNIT/ML IJ SOLN
4000.0000 [IU] | INTRAMUSCULAR | Status: DC
  Administered 2017-01-05 – 2017-01-10 (×3): 4000 [IU] via INTRAVENOUS
  Filled 2017-01-05: qty 1

## 2017-01-05 MED ORDER — GUAIFENESIN ER 600 MG PO TB12
600.0000 mg | ORAL_TABLET | Freq: Two times a day (BID) | ORAL | Status: DC
  Administered 2017-01-05 – 2017-01-16 (×19): 600 mg via ORAL
  Filled 2017-01-05 (×20): qty 1

## 2017-01-05 NOTE — Progress Notes (Signed)
HD STARTED  

## 2017-01-05 NOTE — Plan of Care (Signed)
Problem: Pain Managment: Goal: General experience of comfort will improve Outcome: Not Progressing Patient c/o being very "stuffed up" today. Various nasal sprays ordered and administered. Will continue to monitor. Wenda Low [facility]

## 2017-01-05 NOTE — Progress Notes (Signed)
[place] [facility] [place] NAME: Calvin Good    MR#:  [id number]  DATE OF BIRTH:  [birth year]  SUBJECTIVE:  CHIEF COMPLAINT:   Chief Complaint  Patient presents with  . Chest Pain   - Complains of cough and nasal congestion -Continues to feel dyspneic on minimal exertion  REVIEW OF SYSTEMS:  Review of Systems  Constitutional: Positive for malaise/fatigue. Negative for chills and fever.  HENT: Positive for congestion. Negative for ear discharge, hearing loss and nosebleeds.   Eyes: Negative for blurred vision and double vision.  Respiratory: Positive for cough and shortness of breath. Negative for wheezing.   Cardiovascular: Positive for orthopnea. Negative for chest pain and palpitations.  Gastrointestinal: Negative for abdominal pain, constipation, diarrhea, nausea and vomiting.  Genitourinary: Negative for dysuria.  Musculoskeletal: Negative for myalgias.  Neurological: Negative for dizziness, speech change, focal weakness, seizures and headaches.  Psychiatric/Behavioral: Negative for depression.    DRUG ALLERGIES:   Allergies  Allergen Reactions  . Tetracyclines & Related Other (See Comments)    Reaction: Unknown     VITALS:  Blood pressure (!) 132/40, pulse 74, temperature (!) 97.5 F (36.4 C), temperature source Axillary, resp. rate 11, height 5\' 8"  (1.727 m), weight 102.4 kg (225 lb 12 oz), SpO2 94 %.  PHYSICAL EXAMINATION:  Physical Exam  GENERAL:  76 y.o.-year-old patient lying in the bed, Appears short of breath with minimal exertion EYES: Pupils equal, round, reactive to light and accommodation. No scleral icterus. Extraocular muscles intact.  HEENT: Head atraumatic, normocephalic. Oropharynx and nasopharynx clear.  NECK:  Supple, no jugular venous distention. No thyroid enlargement, no tenderness.  LUNGS: Normal breath sounds bilaterally, no wheezing, rales,rhonchi or crepitation. No use of accessory muscles of  respiration. Decreased bibasilar breath sounds CARDIOVASCULAR: S1, S2 normal. No  rubs, or gallops. 2/6 systolic murmur is present ABDOMEN: Soft, nontender, nondistended. Bowel sounds present. No organomegaly or mass.  EXTREMITIES: No pedal edema, cyanosis, or clubbing. Left arm AV fistula present NEUROLOGIC: Cranial nerves II through XII are intact. Muscle strength 5/5 in all extremities. Sensation intact. Gait not checked.  PSYCHIATRIC: The patient is alert and oriented x 2-3.  SKIN: No obvious rash, lesion, or ulcer.    LABORATORY PANEL:   CBC  Recent Labs Lab 01/05/17 0543  WBC 7.0  HGB 9.1*  HCT 26.9*  PLT 105*   ------------------------------------------------------------------------------------------------------------------  Chemistries   Recent Labs Lab 01/03/17 2341  NA 130*  K 4.2  CL 91*  CO2 25  GLUCOSE 212*  BUN 81*  CREATININE 7.26*  CALCIUM 8.2*   ------------------------------------------------------------------------------------------------------------------  Cardiac Enzymes  Recent Labs Lab 12/31/16 0941  TROPONINI 0.56*   ------------------------------------------------------------------------------------------------------------------  RADIOLOGY:  No results found.  EKG:   Orders placed or performed during the hospital encounter of 12/30/16  . ED EKG within 10 minutes  . ED EKG within 10 minutes  . EKG 12-Lead  . EKG 12-Lead    ASSESSMENT AND PLAN:   76 year old male with past medical history significant for end-stage renal disease on hemodialysis, congestive heart failure, COPD on 2L home oxygen, diabetes and hypertension presents to hospital secondary to worsening dyspnea.  #1 paroxysmal atrial fibrillation with rapid ventricular response- -received amiodarone drip, rate is better controlled. -On oral amiodarone at this time and also carvedilol. -Was not on anticoagulation as outpatient due to GI bleed. Currently on heparin  drip -Discussed with outpatient cardiologist about discharging on oral anticoagulation, especially due to GI bleed during  last hospitalization.  #2 acute on chronic combined CHF, last EF of 50-55%. -Appreciate cardiology consult. Continue oral Lasix at this time. Appears well compensated -History of recurrent right pleural effusion. Appreciate thoraco-surgical consult. -We'll have talc pleurodesis done on Monday- hold heparin gtt in AM -Empirically on Levaquin for right lower lobe infiltrate.  #3diabetes mellitus-continue Levemir and sliding scale insulin. Also on aspart insulin prior to meals.  #4 end-stage renal disease on hemodialysis-appreciate nephrology input.  On Monday Wednesday Friday dialysis. Dialysis today as has missed Friday. Next dialysis on Monday per schedule  #5 COPD-stable at this time. On 3-6L home oxygen -Continue inhalers. Continue outpatient pulmonary follow-up  #6  CAD-with chronic stable angina. Status post cardiac catheterization in May 2018 with diffuse RCA disease and collaterals LAD, left circumflex 75% stenosis. Troponins were elevated on admission. Cardiology recommended medical management. -Continue Imdur  #7 DVT prophylaxis-on heparin drip at this time     All the records are reviewed and case discussed with Care Management/Social Workerr. Management plans discussed with the patient, family and they are in agreement.  CODE STATUS: Full code  TOTAL TIME TAKING CARE OF THIS PATIENT: 37 minutes.   POSSIBLE D/C IN 2 DAYS, DEPENDING ON CLINICAL CONDITION.   Huong Luthi M.D on 01/05/2017 at 12:35 PM  Between 7am to 6pm - Pager - [phone]  After 6pm go to [email or web address] - password [email or web address] [facility]  Office  [phone]  CC: Primary care physician; [name], MD

## 2017-01-05 NOTE — Progress Notes (Signed)
HD COMPLETED  

## 2017-01-05 NOTE — Progress Notes (Signed)
[facility], [place] 01/05/17  Subjective:   Patient known to our practice from outpatient dialysis.  He was sent from dialysis for chest pain and was noted to be in atrial fibrillation with RVR with heart rate of greater than 130   He is scheduled for a pleurodesis procedure on Monday. At present, his main concern is cough productive of clear to yellow sputum. Reports difficulty breathing due to nasal congestion   HEMODIALYSIS FLOWSHEET:  Blood Flow Rate (mL/min): 350 mL/min Arterial Pressure (mmHg): -160 mmHg Venous Pressure (mmHg): 190 mmHg Transmembrane Pressure (mmHg): 70 mmHg Ultrafiltration Rate (mL/min): 720 mL/min Dialysate Flow Rate (mL/min): 800 ml/min Conductivity: Machine : 14 Conductivity: Machine : 14 Dialysis Fluid Bolus: Normal Saline Bolus Amount (mL): 250 mL Dialysate Change:  (3k)     Objective:  Vital signs in last 24 hours:  Temp:  [97.5 F (36.4 C)-97.8 F (36.6 C)] 97.5 F (36.4 C) (10/07 1030) Pulse Rate:  [65-71] 65 (10/07 1131) Resp:  [10-22] 10 (10/07 1131) BP: (121-176)/(39-52) 148/44 (10/07 1131) SpO2:  [94 %-100 %] 94 % (10/07 1030) Weight:  [101.7 kg (224 lb 4.8 oz)-102.4 kg (225 lb 12 oz)] 102.4 kg (225 lb 12 oz) (10/07 1030)  Weight change: -0.862 kg (-1 lb 14.4 oz) Filed Weights   01/04/17 0359 01/05/17 0444 01/05/17 1030  Weight: 102.6 kg (226 lb 3.2 oz) 101.7 kg (224 lb 4.8 oz) 102.4 kg (225 lb 12 oz)    Intake/Output:    Intake/Output Summary (Last 24 hours) at 01/05/17 1146 Last data filed at 01/05/17 1004  Gross per 24 hour  Intake              720 ml  Output                0 ml  Net              720 ml     Physical Exam: General: Chronically ill-appearing,   HEENT moist oral mucus membranes  Neck supple  Pulm/lungs Coarse crackles at rt base, Mild scattered wheezing   CVS/Heart Irregular  Abdomen:  Soft, nontender  Extremities: + peripheral edema  Neurologic: Alert and oriented  Skin:  No acute rashes  access Left arm AV fistula       Basic Metabolic Panel:    Recent Labs Lab 12/30/16 1624 12/31/16 0151 01/01/17 0914 01/02/17 1142 01/03/17 0605 01/03/17 2341  NA 136 133* 128*  --  131* 130*  K 3.4* 3.9 4.0  --  4.4 4.2  CL 95* 94* 90*  --  93* 91*  CO2 29 28 26   --  26 25  GLUCOSE 221* 403* 270*  --  223* 212*  BUN 36* 44* 59*  --  62* 81*  CREATININE 3.62* 4.52* 6.39*  --  6.26* 7.26*  CALCIUM 8.6* 7.9* 8.1*  --  8.4* 8.2*  PHOS  --   --   --  5.0*  --   --      CBC:  Recent Labs Lab 01/02/17 1142 01/03/17 0605 01/03/17 2341 01/04/17 0848 01/05/17 0543  WBC 9.2 9.8 7.9 7.3 7.0  HGB 9.4* 10.1* 9.1* 9.7* 9.1*  HCT 27.5* 28.9* 26.7* 28.4* 26.9*  MCV 85.8 86.9 85.9 85.9 85.7  PLT 112* 125* 122* 109* 105*      Lab Results  Component Value Date   HEPBSAG Negative 11/27/2016      Microbiology:  Recent Results (from the past 240 hour(s))  Blood  Culture (routine x 2)     Status: None   Collection Time: 12/30/16  4:28 PM  Result Value Ref Range Status   Specimen Description BLOOD RIGHT ANTECUBITAL  Final   Special Requests   Final    BOTTLES DRAWN AEROBIC AND ANAEROBIC Blood Culture adequate volume   Culture NO GROWTH 5 DAYS  Final   Report Status 01/04/2017 FINAL  Final  Blood Culture (routine x 2)     Status: None   Collection Time: 12/30/16  4:33 PM  Result Value Ref Range Status   Specimen Description BLOOD BLOOD RIGHT HAND  Final   Special Requests   Final    BOTTLES DRAWN AEROBIC AND ANAEROBIC Blood Culture adequate volume   Culture NO GROWTH 5 DAYS  Final   Report Status 01/04/2017 FINAL  Final  MRSA PCR Screening     Status: None   Collection Time: 12/31/16 12:50 AM  Result Value Ref Range Status   MRSA by PCR NEGATIVE NEGATIVE Final    Comment:        The GeneXpert MRSA Assay (FDA approved for NASAL specimens only), is one component of a comprehensive MRSA colonization surveillance program. It is not intended to  diagnose MRSA infection nor to guide or monitor treatment for MRSA infections.     Coagulation Studies: No results for input(s): LABPROT, INR in the last 72 hours.  Urinalysis: No results for input(s): COLORURINE, LABSPEC, PHURINE, GLUCOSEU, HGBUR, BILIRUBINUR, KETONESUR, PROTEINUR, UROBILINOGEN, NITRITE, LEUKOCYTESUR in the last 72 hours.  Invalid input(s): APPERANCEUR    Imaging: No results found.   Medications:   . sodium chloride    . heparin 1,750 Units/hr (01/05/17 0956)  . [START ON 01/13/2017] vancomycin     . amiodarone  400 mg Oral Daily  . aspirin EC  81 mg Oral Daily  . atorvastatin  20 mg Oral QHS  . budesonide (PULMICORT) nebulizer solution  0.5 mg Nebulization BID  . calcium acetate  667 mg Oral TID WC  . carvedilol  6.25 mg Oral BID  . doxazosin  8 mg Oral QPM  . feeding supplement (NEPRO CARB STEADY)  237 mL Oral BID BM  . furosemide  40 mg Oral BID  . insulin aspart  0-5 Units Subcutaneous QHS  . insulin aspart  0-9 Units Subcutaneous TID WC  . insulin aspart  0-9 Units Subcutaneous TID WC  . insulin aspart  3 Units Subcutaneous TID WC  . insulin detemir  20 Units Subcutaneous QHS  . ipratropium-albuterol  3 mL Nebulization Q6H  . isosorbide mononitrate  60 mg Oral Daily  . levofloxacin  500 mg Oral Q48H  . lisinopril  20 mg Oral QPM  . pantoprazole  40 mg Oral Daily  . sodium chloride flush  3 mL Intravenous Q12H  . spironolactone  25 mg Oral BID   sodium chloride, acetaminophen **OR** acetaminophen, benzonatate, fentaNYL (SUBLIMAZE) injection, guaiFENesin-codeine, HYDROcodone-acetaminophen, lidocaine-prilocaine, senna-docusate, sodium chloride, sodium chloride flush, traMADol  Assessment/ Plan:  76 y.o. male with diabetes mellitus type II insulin dependent, hypertension, coronary artery disease, hyperlipidemia, prostate cancer status post prostatectomy, right total knee, appendectomy.   MWF [facility] [place] AVF  1. End Stage Renal  Disease MWF:  - Hemodialysis on Sunday UF goal upto 2 L  as tolerated Since patient had treatment on Sunday, will not schedule dialysis on Monday - Next HD on Tuesday or Wednesday as per clinical situation  2. Anemia of chronic kidney disease/Acute GI bleed 11/26/16.  Hemoglobin 9.1  Continue Procrit with dialysis  3. 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  - Management as per hospitalist.    4. Secondary Hyperparathyroidism:  - monitor phosphorus during hospitalization  5. A Fib with RVR:  It is a recurrent problem Cardiology evaluation ongoing Talc Pleurodesis planned for monday   LOS: 6 Calvin Good 10/7/201811:46 AM  [facility] [place], [place]

## 2017-01-05 NOTE — Progress Notes (Signed)
POST DIALYSIS ASSESSMENT 

## 2017-01-05 NOTE — Progress Notes (Signed)
PRE DIALYSIS ASSESSMENT 

## 2017-01-06 ENCOUNTER — Encounter
Admission: EM | Disposition: A | Discharge status: Skilled Nursing Facility | Payer: Self-pay | Source: Home / Self Care | Attending: Internal Medicine

## 2017-01-06 ENCOUNTER — Inpatient Hospital Stay: Payer: Medicare Other

## 2017-01-06 ENCOUNTER — Encounter: Payer: Self-pay | Admitting: *Deleted

## 2017-01-06 ENCOUNTER — Inpatient Hospital Stay: Payer: Medicare Other | Admitting: Anesthesiology

## 2017-01-06 ENCOUNTER — Inpatient Hospital Stay: Admission: RE | Admit: 2017-01-06 | Payer: Medicare Other | Source: Ambulatory Visit | Admitting: Cardiothoracic Surgery

## 2017-01-06 DIAGNOSIS — Z992 Dependence on renal dialysis: Secondary | ICD-10-CM

## 2017-01-06 DIAGNOSIS — I480 Paroxysmal atrial fibrillation: Secondary | ICD-10-CM

## 2017-01-06 DIAGNOSIS — J9601 Acute respiratory failure with hypoxia: Secondary | ICD-10-CM

## 2017-01-06 DIAGNOSIS — J90 Pleural effusion, not elsewhere classified: Secondary | ICD-10-CM

## 2017-01-06 DIAGNOSIS — N186 End stage renal disease: Secondary | ICD-10-CM

## 2017-01-06 HISTORY — PX: VIDEO ASSISTED THORACOSCOPY (VATS)/THOROCOTOMY: SHX6173

## 2017-01-06 LAB — BASIC METABOLIC PANEL
Anion gap: 11 (ref 5–15)
Anion gap: 14 (ref 5–15)
BUN: 53 mg/dL — AB (ref 6–20)
BUN: 57 mg/dL — AB (ref 6–20)
CALCIUM: 8.3 mg/dL — AB (ref 8.9–10.3)
CHLORIDE: 96 mmol/L — AB (ref 101–111)
CO2: 27 mmol/L (ref 22–32)
CO2: 25 mmol/L (ref 22–32)
CREATININE: 5.74 mg/dL — AB (ref 0.61–1.24)
Calcium: 8.6 mg/dL — ABNORMAL LOW (ref 8.9–10.3)
Chloride: 94 mmol/L — ABNORMAL LOW (ref 101–111)
Creatinine, Ser: 6.61 mg/dL — ABNORMAL HIGH (ref 0.61–1.24)
GFR calc Af Amer: 10 mL/min — ABNORMAL LOW (ref >60)
GFR calc Af Amer: 8 mL/min — ABNORMAL LOW (ref >60)
GFR calc non Af Amer: 8 mL/min — ABNORMAL LOW (ref >60)
GFR calc non Af Amer: 7 mL/min — ABNORMAL LOW (ref >60)
GLUCOSE: 185 mg/dL — AB (ref 65–99)
Glucose, Bld: 195 mg/dL — ABNORMAL HIGH (ref 65–99)
Potassium: 4.5 mmol/L (ref 3.5–5.1)
Potassium: 5 mmol/L (ref 3.5–5.1)
SODIUM: 134 mmol/L — AB (ref 135–145)
Sodium: 133 mmol/L — ABNORMAL LOW (ref 135–145)

## 2017-01-06 LAB — CBC
HCT: 26.9 % — ABNORMAL LOW (ref 40.0–52.0)
HCT: 31.8 % — ABNORMAL LOW (ref 40.0–52.0)
HEMOGLOBIN: 9.4 g/dL — AB (ref 13.0–18.0)
Hemoglobin: 10.6 g/dL — ABNORMAL LOW (ref 13.0–18.0)
MCH: 30.3 pg (ref 26.0–34.0)
MCH: 29.6 pg (ref 26.0–34.0)
MCHC: 34.9 g/dL (ref 32.0–36.0)
MCHC: 33.4 g/dL (ref 32.0–36.0)
MCV: 86.9 fL (ref 80.0–100.0)
MCV: 88.7 fL (ref 80.0–100.0)
PLATELETS: 137 10*3/uL — AB (ref 150–440)
Platelets: 114 10*3/uL — ABNORMAL LOW (ref 150–440)
RBC: 3.1 MIL/uL — ABNORMAL LOW (ref 4.40–5.90)
RBC: 3.58 MIL/uL — ABNORMAL LOW (ref 4.40–5.90)
RDW: 15.1 % — AB (ref 11.5–14.5)
RDW: 15.4 % — AB (ref 11.5–14.5)
WBC: 6.5 10*3/uL (ref 3.8–10.6)
WBC: 10.1 10*3/uL (ref 3.8–10.6)

## 2017-01-06 LAB — GLUCOSE, CAPILLARY
GLUCOSE-CAPILLARY: 161 mg/dL — AB (ref 65–99)
GLUCOSE-CAPILLARY: 156 mg/dL — AB (ref 65–99)
GLUCOSE-CAPILLARY: 199 mg/dL — AB (ref 65–99)
Glucose-Capillary: 106 mg/dL — ABNORMAL HIGH (ref 65–99)
Glucose-Capillary: 164 mg/dL — ABNORMAL HIGH (ref 65–99)

## 2017-01-06 LAB — PROCALCITONIN: PROCALCITONIN: 0.75 ng/mL

## 2017-01-06 LAB — HEPARIN LEVEL (UNFRACTIONATED): Heparin Unfractionated: <0.1 IU/mL — ABNORMAL LOW (ref 0.30–0.70)

## 2017-01-06 SURGERY — VIDEO ASSISTED THORACOSCOPY (VATS)/THOROCOTOMY
Anesthesia: General | Laterality: Right | Wound class: Clean

## 2017-01-06 MED ORDER — EPHEDRINE SULFATE 50 MG/ML IJ SOLN
INTRAMUSCULAR | Status: DC | PRN
  Administered 2017-01-06: 15 mg via INTRAVENOUS

## 2017-01-06 MED ORDER — ONDANSETRON HCL 4 MG/2ML IJ SOLN: 4.0000 mg | Freq: Four times a day (QID) | INTRAMUSCULAR | Status: DC | PRN

## 2017-01-06 MED ORDER — SODIUM CHLORIDE FLUSH 0.9 % IV SOLN
INTRAVENOUS | Status: AC
  Filled 2017-01-06: qty 10

## 2017-01-06 MED ORDER — BUPIVACAINE HCL (PF) 0.5 % IJ SOLN
INTRAMUSCULAR | Status: AC
  Filled 2017-01-06: qty 30

## 2017-01-06 MED ORDER — ONDANSETRON HCL 4 MG/2ML IJ SOLN
INTRAMUSCULAR | Status: DC | PRN
Start: 2017-01-06 — End: 2017-01-06
  Administered 2017-01-06: 4 mg via INTRAVENOUS

## 2017-01-06 MED ORDER — LIDOCAINE-PRILOCAINE 2.5-2.5 % EX CREA
TOPICAL_CREAM | Freq: Once | CUTANEOUS | Status: AC
  Administered 2017-01-06: 23:00:00 via TOPICAL
  Filled 2017-01-06: qty 5

## 2017-01-06 MED ORDER — VANCOMYCIN HCL IN DEXTROSE 1-5 GM/200ML-% IV SOLN
INTRAVENOUS | Status: AC
  Administered 2017-01-06: 1000 mg via INTRAVENOUS
  Filled 2017-01-06: qty 200

## 2017-01-06 MED ORDER — LACTATED RINGERS IV SOLN
INTRAVENOUS | Status: DC | PRN
  Administered 2017-01-06: 14:00:00 via INTRAVENOUS

## 2017-01-06 MED ORDER — MIDAZOLAM HCL 2 MG/2ML IJ SOLN
INTRAMUSCULAR | Status: DC | PRN
  Administered 2017-01-06: 2 mg via INTRAVENOUS

## 2017-01-06 MED ORDER — TALC 5 G PL SUSR
INTRAPLEURAL | Status: DC | PRN
  Administered 2017-01-06: 4 g via INTRAPLEURAL

## 2017-01-06 MED ORDER — KCL IN DEXTROSE-NACL 20-5-0.45 MEQ/L-%-% IV SOLN
INTRAVENOUS | Status: DC
  Administered 2017-01-06: 18:00:00 via INTRAVENOUS
  Filled 2017-01-06 (×2): qty 1000

## 2017-01-06 MED ORDER — FENTANYL CITRATE (PF) 100 MCG/2ML IJ SOLN: 25.0000 ug | INTRAMUSCULAR | Status: DC | PRN

## 2017-01-06 MED ORDER — VASOPRESSIN 20 UNIT/ML IV SOLN
INTRAVENOUS | Status: DC | PRN
  Administered 2017-01-06 (×2): 1 [IU] via INTRAVENOUS

## 2017-01-06 MED ORDER — IPRATROPIUM-ALBUTEROL 0.5-2.5 (3) MG/3ML IN SOLN
3.0000 mL | Freq: Four times a day (QID) | RESPIRATORY_TRACT | Status: DC | PRN
  Administered 2017-01-07: 3 mL via RESPIRATORY_TRACT
  Filled 2017-01-06: qty 3

## 2017-01-06 MED ORDER — LIDOCAINE HCL (CARDIAC) 20 MG/ML IV SOLN
INTRAVENOUS | Status: DC | PRN
  Administered 2017-01-06: 40 mg via INTRAVENOUS

## 2017-01-06 MED ORDER — PHENYLEPHRINE HCL 10 MG/ML IJ SOLN
INTRAMUSCULAR | Status: DC | PRN
  Administered 2017-01-06: 200 ug via INTRAVENOUS
  Administered 2017-01-06: 100 ug via INTRAVENOUS
  Administered 2017-01-06 (×2): 200 ug via INTRAVENOUS

## 2017-01-06 MED ORDER — NALOXONE HCL 2 MG/2ML IJ SOSY
PREFILLED_SYRINGE | INTRAMUSCULAR | Status: AC
  Filled 2017-01-06: qty 2

## 2017-01-06 MED ORDER — PROPOFOL 10 MG/ML IV BOLUS
INTRAVENOUS | Status: DC | PRN
  Administered 2017-01-06: 160 mg via INTRAVENOUS

## 2017-01-06 MED ORDER — IPRATROPIUM-ALBUTEROL 0.5-2.5 (3) MG/3ML IN SOLN
3.0000 mL | Freq: Once | RESPIRATORY_TRACT | Status: AC
  Administered 2017-01-06: 3 mL via RESPIRATORY_TRACT

## 2017-01-06 MED ORDER — MORPHINE SULFATE (PF) 2 MG/ML IV SOLN
1.0000 mg | INTRAVENOUS | Status: DC | PRN
  Administered 2017-01-06: 1 mg via INTRAVENOUS
  Administered 2017-01-08 (×2): 2 mg via INTRAVENOUS
  Filled 2017-01-06 (×5): qty 1

## 2017-01-06 MED ORDER — SUGAMMADEX SODIUM 200 MG/2ML IV SOLN
INTRAVENOUS | Status: DC | PRN
  Administered 2017-01-06: 200 mg via INTRAVENOUS

## 2017-01-06 MED ORDER — DEXTROSE 5 % IV SOLN
1.5000 g | Freq: Two times a day (BID) | INTRAVENOUS | Status: AC
  Administered 2017-01-06 – 2017-01-07 (×2): 1.5 g via INTRAVENOUS
  Filled 2017-01-06 (×2): qty 1.5

## 2017-01-06 MED ORDER — FENTANYL CITRATE (PF) 100 MCG/2ML IJ SOLN
INTRAMUSCULAR | Status: DC | PRN
  Administered 2017-01-06: 100 ug via INTRAVENOUS

## 2017-01-06 MED ORDER — ALBUTEROL SULFATE (2.5 MG/3ML) 0.083% IN NEBU
2.5000 mg | INHALATION_SOLUTION | Freq: Four times a day (QID) | RESPIRATORY_TRACT | Status: DC
  Filled 2017-01-06: qty 3

## 2017-01-06 MED ORDER — DEXTROSE-NACL 5-0.45 % IV SOLN
INTRAVENOUS | Status: DC
  Administered 2017-01-06: 19:00:00 via INTRAVENOUS

## 2017-01-06 MED ORDER — ONDANSETRON HCL 4 MG/2ML IJ SOLN: 4.0000 mg | Freq: Once | INTRAMUSCULAR | Status: DC | PRN

## 2017-01-06 MED ORDER — IPRATROPIUM-ALBUTEROL 0.5-2.5 (3) MG/3ML IN SOLN
RESPIRATORY_TRACT | Status: AC
  Administered 2017-01-06: 3 mL via RESPIRATORY_TRACT
  Filled 2017-01-06: qty 3

## 2017-01-06 MED ORDER — HEPARIN SODIUM (PORCINE) 5000 UNIT/ML IJ SOLN
INTRAMUSCULAR | Status: AC
  Filled 2017-01-06: qty 1

## 2017-01-06 MED ORDER — HALOPERIDOL LACTATE 5 MG/ML IJ SOLN
2.5000 mg | Freq: Once | INTRAMUSCULAR | Status: AC
  Administered 2017-01-06: 2.5 mg via INTRAVENOUS
  Filled 2017-01-06: qty 1

## 2017-01-06 MED ORDER — FLUMAZENIL 0.5 MG/5ML IV SOLN
INTRAVENOUS | Status: DC | PRN
  Administered 2017-01-06: 0.5 mg via INTRAVENOUS

## 2017-01-06 MED ORDER — ROCURONIUM BROMIDE 100 MG/10ML IV SOLN
INTRAVENOUS | Status: DC | PRN
  Administered 2017-01-06 (×2): 30 mg via INTRAVENOUS

## 2017-01-06 MED ORDER — ALBUTEROL SULFATE (2.5 MG/3ML) 0.083% IN NEBU: 2.5000 mg | INHALATION_SOLUTION | RESPIRATORY_TRACT | Status: DC

## 2017-01-06 SURGICAL SUPPLY — 77 items
BLADE SURG SZ11 CARB STEEL (BLADE) ×3 IMPLANT
BNDG COHESIVE 4X5 TAN STRL (GAUZE/BANDAGES/DRESSINGS) IMPLANT
BRONCHOSCOPE PED SLIM DISP (MISCELLANEOUS) ×3 IMPLANT
CANISTER SUCT 1200ML W/VALVE (MISCELLANEOUS) ×3 IMPLANT
CATH URET ROBINSON 16FR STRL (CATHETERS) IMPLANT
CHLORAPREP W/TINT 26ML (MISCELLANEOUS) ×9 IMPLANT
CONN REDUCER 1/4X3/8 STR (CONNECTOR) ×3
CONNECTOR REDUCER 1/4X3/8 STR (CONNECTOR) ×2 IMPLANT
CUTTER ECHEON FLEX ENDO 45 340 (ENDOMECHANICALS) ×3 IMPLANT
DEFOGGER SCOPE WARMER CLEARIFY (MISCELLANEOUS) ×3 IMPLANT
DRAIN CHEST DRY SUCT SGL (MISCELLANEOUS) ×3 IMPLANT
DRAPE C-SECTION (MISCELLANEOUS) ×3 IMPLANT
DRAPE INCISE IOBAN 66X45 STRL (DRAPES) IMPLANT
DRAPE LAPAROTOMY 77X122 PED (DRAPES) IMPLANT
DRAPE MAG INST 16X20 L/F (DRAPES) ×3 IMPLANT
DRAPE POUCH INSTRU U-SHP 10X18 (DRAPES) IMPLANT
DRSG OPSITE POSTOP 3X4 (GAUZE/BANDAGES/DRESSINGS) ×3 IMPLANT
DRSG TEGADERM 6X8 (GAUZE/BANDAGES/DRESSINGS) ×3 IMPLANT
ELECT BLADE 6 FLAT ULTRCLN (ELECTRODE) ×3 IMPLANT
ELECT BLADE 6.5 EXT (BLADE) ×3 IMPLANT
ELECT CAUTERY BLADE TIP 2.5 (TIP) ×3
ELECT REM PT RETURN 9FT ADLT (ELECTROSURGICAL) ×3
ELECTRODE CAUTERY BLDE TIP 2.5 (TIP) ×2 IMPLANT
ELECTRODE REM PT RTRN 9FT ADLT (ELECTROSURGICAL) ×2 IMPLANT
GAUZE SPONGE 4X4 12PLY STRL (GAUZE/BANDAGES/DRESSINGS) ×3 IMPLANT
GLOVE SURG SYN 7.5  E (GLOVE) ×4
GLOVE SURG SYN 7.5 E (GLOVE) ×8 IMPLANT
GOWN STRL REUS W/ TWL LRG LVL3 (GOWN DISPOSABLE) ×4 IMPLANT
GOWN STRL REUS W/TWL LRG LVL3 (GOWN DISPOSABLE) ×2
KIT PLEURX DRAIN CATH 15.5FR (DRAIN) IMPLANT
KIT RM TURNOVER STRD PROC AR (KITS) ×3 IMPLANT
LABEL OR SOLS (LABEL) ×3 IMPLANT
LOOP RED MAXI  1X406MM (MISCELLANEOUS)
LOOP VESSEL MAXI 1X406 RED (MISCELLANEOUS) IMPLANT
MARKER SKIN DUAL TIP RULER LAB (MISCELLANEOUS) ×3 IMPLANT
NEEDLE FILTER BLUNT 18X 1/2SAF (NEEDLE)
NEEDLE FILTER BLUNT 18X1 1/2 (NEEDLE) IMPLANT
NEEDLE HYPO 22GX1.5 SAFETY (NEEDLE) ×3 IMPLANT
NEEDLE SPNL 20GX3.5 QUINCKE YW (NEEDLE) IMPLANT
NS IRRIG 500ML POUR BTL (IV SOLUTION) IMPLANT
PACK BASIN MAJOR ARMC (MISCELLANEOUS) ×3 IMPLANT
PACK BASIN MINOR ARMC (MISCELLANEOUS) ×3 IMPLANT
SCISSORS METZENBAUM CVD 33 (INSTRUMENTS) IMPLANT
SPONGE KITTNER 5P (MISCELLANEOUS) ×3 IMPLANT
STAPLER SKIN PROX 35W (STAPLE) IMPLANT
STAPLER VASCULAR ECHELON 35 (CUTTER) IMPLANT
STRIP CLOSURE SKIN 1/2X4 (GAUZE/BANDAGES/DRESSINGS) IMPLANT
SUCTION FRAZIER HANDLE 10FR (MISCELLANEOUS)
SUCTION TUBE FRAZIER 10FR DISP (MISCELLANEOUS) IMPLANT
SUT ETHILON 3-0 FS-10 30 BLK (SUTURE)
SUT ETHILON 4-0 (SUTURE) ×1
SUT ETHILON 4-0 FS2 18XMFL BLK (SUTURE) ×2
SUT MNCRL AB 3-0 PS2 27 (SUTURE) ×3 IMPLANT
SUT SILK 1 SH (SUTURE) ×12 IMPLANT
SUT VIC AB 0 CT1 36 (SUTURE) ×6 IMPLANT
SUT VIC AB 0 SH 27 (SUTURE) IMPLANT
SUT VIC AB 2-0 CT1 27 (SUTURE) ×2
SUT VIC AB 2-0 CT1 TAPERPNT 27 (SUTURE) ×4 IMPLANT
SUT VIC AB 2-0 CT2 27 (SUTURE) IMPLANT
SUT VIC AB 2-0 SH 27 (SUTURE)
SUT VIC AB 2-0 SH 27XBRD (SUTURE) IMPLANT
SUT VIC AB 3-0 SH 27 (SUTURE)
SUT VIC AB 3-0 SH 27X BRD (SUTURE) IMPLANT
SUT VICRYL 2 TP 1 (SUTURE) IMPLANT
SUTURE EHLN 3-0 FS-10 30 BLK (SUTURE) IMPLANT
SUTURE ETHLN 4-0 FS2 18XMF BLK (SUTURE) ×2 IMPLANT
SYR 10ML LL (SYRINGE) ×3 IMPLANT
SYR 30ML LL (SYRINGE) IMPLANT
SYR BULB IRRIG 60ML STRL (SYRINGE) ×3 IMPLANT
TAPE ADH 3 LX (MISCELLANEOUS) ×3 IMPLANT
TAPE CLOTH 3X10 WHT NS LF (GAUZE/BANDAGES/DRESSINGS) IMPLANT
TAPE TRANSPORE STRL 2 31045 (GAUZE/BANDAGES/DRESSINGS) ×3 IMPLANT
TRAY FOLEY W/METER SILVER 16FR (SET/KITS/TRAYS/PACK) IMPLANT
TROCAR FLEXIPATH 20X80 (ENDOMECHANICALS) ×3 IMPLANT
TROCAR FLEXIPATH THORACIC 15MM (ENDOMECHANICALS) IMPLANT
WATER STERILE IRR 1000ML POUR (IV SOLUTION) ×3 IMPLANT
YANKAUER SUCT BULB TIP FLEX NO (MISCELLANEOUS) ×3 IMPLANT

## 2017-01-06 NOTE — Transfer of Care (Signed)
Immediate Anesthesia Transfer of Care Note  Patient: Calvin Good  Procedure(s) Performed: PREOPERATIVE BRONCHOSCOPY, RIGHT VIDEO ASSISTED THORACOSCOPY (VATS) WITH TALC PLEURODESIS (Right )  Patient Location: PACU  Anesthesia Type:General  Level of Consciousness: sedated and responds to stimulation  Airway & Oxygen Therapy: Patient Spontanous Breathing and Patient connected to face mask oxygen  Post-op Assessment: Report given to RN and Post -op Vital signs reviewed and stable  Post vital signs: Reviewed and stable  Last Vitals:  Vitals:   01/06/17 1225 01/06/17 1534  BP: (!) 130/46 (!) 121/48  Pulse: 64 66  Resp: (!) 22 15  Temp: (!) 36 C (!) 36.2 C  SpO2: 97% 94%    Last Pain:  Vitals:   01/06/17 1259  TempSrc:   PainSc: 3       Patients Stated Pain Goal: 0 (08/09/00 1117)  Complications: No apparent anesthesia complications

## 2017-01-06 NOTE — Op Note (Signed)
  12/30/2016 - 01/06/2017  3:44 PM  PATIENT:  Calvin Good  76 y.o. male  PRE-OPERATIVE DIAGNOSIS:  Recurrent right-sided pleural effusion  POST-OPERATIVE DIAGNOSIS:  Recurrent right sided pleural effusion  PROCEDURE:  Preoperative bronchoscopy with right thoracoscopy and talc pleurodesis  SURGEON:  Surgeon(s) and Role:    Nestor Lewandowsky, MD - Primary  ASSISTANTS: None  ANESTHESIA: Gen. endotracheal anesthesia  INDICATIONS FOR PROCEDURE this 76 year old gentleman with a history of renal failure and cardiac failure has had multiple recurrent right-sided pleural effusions requiring drainage. He refused a Pleurx catheter and therefore was offered the above-named procedure for definitive treatment of his recurrent pleural effusion.  DICTATION: Patient is brought to the operating suite and placed in supine position. General endotracheal anesthesia was given through a double-lumen tube. Preoperative bronchoscopy was carried out. There were extensive secretions present on the right side making visualization of the endobronchial anatomy difficult however there is no gross purulence or blood in the airways. The tube was appropriately positioned and the patient was turned for right thoracoscopy. All pressure points were carefully padded. Patient was prepped and draped in usual sterile fashion.  A 12 mm trocar was inserted in approximately the sixth intercostal space. Through this trocar we were able to suction 1.4 L of clear yellow fluid from the pleural space. Thoracoscopy was carried out. There is no evidence of tumor within the pleural space. There is no evidence of infection within the pleural space.  4 g of sterile talc were then insufflated under direct visualization coating the entire hemithorax. The chest was drained with a 57 Blake position in the paravertebral space and brought out through a separate stab wound. The lung was then reinflated. The wounds were closed with multiple layers of  running Vicryl suture. The skin was closed with nylon and the tube was secured with silk. The patient was then turned in the supine position where he was extubated and taken to the recovery room in stable condition.   Nestor Lewandowsky, MD

## 2017-01-06 NOTE — Anesthesia Post-op Follow-up Note (Signed)
Anesthesia QCDR form completed.        

## 2017-01-06 NOTE — Consult Note (Signed)
Name: Calvin Good MRN: [id number] DOB: 02-20-1941    ADMISSION DATE:  12/30/2016 CONSULTATION DATE: 01/06/2017  REFERRING MD : Dr. Genevive Bi   CHIEF COMPLAINT: Chest Pain   BRIEF PATIENT DESCRIPTION:  76 yo male admitted to telemetry unit 10/1 with c/o chest pain found to be in afibb/rvr requiring amiodarone gtt, on 10/8 he underwent a bronchoscopy with right thoracoscopy and talc pleurodesis due to recurrent right sided pleural effusion.  Due to acute on chronic hypoxic respiratory failure secondary to pulmonary edema post procedure and possible pneumonia requiring Bipap and was transferred to the stepdown unit.    SIGNIFICANT EVENTS  10/1-Pt admitted to telemetry unit 10/8-Pt admitted to stepdown unit   STUDIES:  None   HISTORY OF PRESENT ILLNESS:   This is a 76 yo male with a PMH of Renal Insufficiency, Pleural Effusion, HTN, Hyperlipidemia, GERD, Difficult Intubation, Diabetes Mellitus, COPD, CKD (on hemodialysis), CHF, and Prostate Cancer. He presented to [facility] ER 10/1 with c/o 10/10 sternal chest pain, mild shortness of breath, and palpitations following hemodialysis treatment on 10/1.  Upon arrival to the ER he was found to be in atrial fibrillation with rvr hr 130-170's, therefore initially placed on cardizem gtt however due to poor response Cardiology recommended placing pt on an amiodarone gtt instead.  He was subsequently admitted to the telemetry unit by hospitalist team for further workup and treatment.  He did have an elevated troponin and was placed on a heparin gtt on 10/2. Due to enlarging recurrent right sided pleural effusion cardiothoracic surgery consulted, on 10/8 he underwent a bronchoscopy with right thoracoscopy and talc pleurodesis and was transferred to the stepdown unit due to hypoxia post procedure requiring Bipap.    PAST MEDICAL HISTORY :   has a past medical history of Cancer [facility]); CHF (congestive heart failure) ([place]); Chronic kidney disease; Complication of  anesthesia; COPD (chronic obstructive pulmonary disease) ([place]); Diabetes mellitus without complication ([place]); Difficult intubation; Dyspnea; GERD (gastroesophageal reflux disease); Hyperlipidemia; Hypertension; Pleural effusion; and Renal insufficiency.  has a past surgical history that includes postate removal ; Appendectomy; Prostate surgery; Joint replacement; DIALYSIS/PERMA CATHETER INSERTION; AV fistula placement (Left, 05/30/2016); CAPD insertion (N/A, 05/30/2016); Removal of a dialysis catheter (N/A, 08/08/2016); DIALYSIS/PERMA CATHETER REMOVAL (N/A, 08/21/2016); LEFT HEART CATH AND CORONARY ANGIOGRAPHY (N/A, 08/21/2016); and PSEUDOANERYSM COMPRESSION (Right, 08/27/2016). Prior to Admission medications   Medication Sig Start Date End Date Taking? Authorizing Provider  acetaminophen (TYLENOL) 325 MG tablet Take 2 tablets (650 mg total) by mouth every 6 (six) hours as needed for mild pain (or Fever >/= 101). 09/23/16  Yes Gouru, Illene Silver, MD  amLODipine (NORVASC) 5 MG tablet Take 5 mg by mouth daily.   Yes [provider]  aspirin EC 81 MG tablet Take 81 mg by mouth daily.   Yes [provider]  atorvastatin (LIPITOR) 20 MG tablet Take 20 mg by mouth at bedtime.    Yes [provider]  calcium acetate (PHOSLO) 667 MG capsule Take 1,334 mg by mouth 3 (three) times daily with meals.    Yes [provider]  carvedilol (COREG) 6.25 MG tablet Take 6.25 mg by mouth 2 (two) times daily. 8 am, 5 pm 09/05/16  Yes [provider]  doxazosin (CARDURA) 8 MG tablet Take 8 mg by mouth every evening.    Yes [provider]  furosemide (LASIX) 40 MG tablet Take 40 mg by mouth 2 (two) times daily.    Yes [provider]  GUAIFENESIN 1200 PO Take 1,200  mg by mouth at bedtime.   Yes [provider]  indomethacin (INDOCIN) 50 MG capsule Take 50 mg by mouth 2 (two) times daily with a meal.   Yes [provider]  insulin detemir (LEVEMIR) 100 UNIT/ML  injection Inject 30 Units into the skin at bedtime.   Yes [provider]  insulin lispro (HUMALOG) 100 UNIT/ML injection Inject 10 Units into the skin 3 (three) times daily before meals.   Yes [provider]  ipratropium-albuterol (DUONEB) 0.5-2.5 (3) MG/3ML SOLN Take 3 mLs by nebulization every 6 (six) hours as needed. 09/23/16  Yes Gouru, Illene Silver, MD  isosorbide mononitrate (IMDUR) 30 MG 24 hr tablet Take 30 mg by mouth daily.  11/28/16  Yes [provider]  Lidocaine-Prilocaine, Bulk, 2.5-2.5 % CREA Apply 1 application topically as needed. Apply to fistula site prior to dialysis treatments as needed   Yes [provider]  lisinopril (PRINIVIL,ZESTRIL) 20 MG tablet Take 20 mg by mouth every evening.    Yes [provider]  omeprazole (PRILOSEC) 20 MG capsule Take 20 mg by mouth 2 (two) times daily before a meal.    Yes [provider]  OXYGEN Inhale 2-5 L into the lungs continuous.    Yes [provider]  spironolactone (ALDACTONE) 25 MG tablet Take 25 mg by mouth daily.    Yes [provider]  traMADol (ULTRAM) 50 MG tablet Take 1 tablet (50 mg total) by mouth every 12 (twelve) hours as needed for moderate pain or severe pain. 11/27/16 11/27/17 Yes Gladstone Lighter, MD   Allergies  Allergen Reactions  . Tetracyclines & Related Other (See Comments)    Reaction: Unknown     FAMILY HISTORY:  family history includes Brain cancer in his mother; Colon cancer in his father. SOCIAL HISTORY:  reports that he quit smoking about 34 years ago. His smoking use included Cigarettes. He has never used smokeless tobacco. He reports that he drinks about 1.8 oz of alcohol per week . He reports that he does not use drugs.  REVIEW OF SYSTEMS:  Unable to assess pt on Bipap   SUBJECTIVE:  Unable to assess pt on Bipap   VITAL SIGNS: Temp:  [96.8 F (36 C)-98.7 F (37.1 C)] 98.5 F (36.9 C) (10/08 1759) Pulse Rate:  [64-79] 79 (10/08  1800) Resp:  [11-22] 11 (10/08 1800) BP: (118-160)/(39-74) 160/61 (10/08 1800) SpO2:  [85 %-98 %] 98 % (10/08 1800) FiO2 (%):  [100 %] 100 % (10/08 1759) Weight:  [99.8 kg (220 lb)-100.1 kg (220 lb 11.2 oz)] 99.8 kg (220 lb) (10/08 1259)  PHYSICAL EXAMINATION: General: well developed, well nourished male, NAD Neuro: lehtargic, follows commands HEENT: supple, no JVD Cardiovascular: s1s2, rrr, no M/R/G Lungs: diffuse crackles throughout, even, non labored on Bipap, right sided chest pain draining serosanguinous fluid  Abdomen: +BS x4, soft, non tender, non distended Musculoskeletal: normal bulk and tone, 2+ bilateral lower extremity edema  Skin: right sided chest tube incision dressing dry intact, no subcutaneous emphysema present    Recent Labs Lab 01/03/17 2341 01/06/17 0539 01/06/17 1830  NA 130* 134* 133*  K 4.2 4.5 5.0  CL 91* 96* 94*  CO2 25 27 25   BUN 81* 53* 57*  CREATININE 7.26* 5.74* 6.61*  GLUCOSE 212* 195* 185*    Recent Labs Lab 01/05/17 0543 01/06/17 0539 01/06/17 1830  HGB 9.1* 9.4* 10.6*  HCT 26.9* 26.9* 31.8*  WBC 7.0 6.5 10.1  PLT 105* 114* 137*   Dg Chest  2 View  Result Date: 01/06/2017 CLINICAL DATA:  Pneumonia, prostate cancer. EXAM: CHEST  2 VIEW COMPARISON:  01/02/2017 and PET 10/22/2016.  CT chest 10/17/2016. FINDINGS: Trachea is midline. Heart is mildly enlarged. Thoracic aorta is calcified. Mild initial prominence with bibasilar airspace opacification, right greater than left. Small right pleural effusion. Tiny left pleural effusion. IMPRESSION: 1. Mild CHF. 2. Right lower lobe collapse/consolidation may be due to pneumonia. Overall aeration at the right lung base is slightly improved from 01/02/2017. 3. Left lower lobe atelectasis. 4.  Aortic atherosclerosis (ICD10-170.0). Electronically Signed   By: Lorin Picket M.D.   On: 01/06/2017 07:43   Dg Chest Port 1 View  Result Date: 01/06/2017 CLINICAL DATA:  Postoperative radiograph. EXAM:  PORTABLE CHEST 1 VIEW COMPARISON:  01/06/2017 FINDINGS: Right-sided catheter loops over the lower thorax, tip in uncertain location. Cardiomediastinal silhouette is stably enlarged. Mediastinal contour is enlarged, which may be exaggerated by portable AP technique. Calcific atherosclerotic disease of the aorta. Bilateral lower lobe airspace consolidation. Osseous structures are without acute abnormality. Soft tissues are grossly normal. IMPRESSION: Right-sided catheter with tip overlying right lower thorax/ upper abdomen, in uncertain location. Apparent enlargement of the mediastinum, query postsurgical. Please correlate clinically. Bilateral lower lobe airspace consolidation. Electronically Signed   By: Fidela Salisbury M.D.   On: 01/06/2017 16:17    ASSESSMENT / PLAN: Atrial Fibrillation with RVR Acute on chronic hypoxic respiratory failure secondary to pulmonary edema and questionable pneumonia Recurrent right sided pleural effusion s/p bronchoscopy with right thoracoscopy and talc pleurodesis on 10/8 Elevated troponin's likely demand ischemia per cardiology ESRD on hemodialysis  Diabetes Mellitus  Hx: COPD, CHF, Difficult Intubation, and HTN P: Continue Bipap and/or supplemental O2 to maintain O2 sats 88% to 92% Continue scheduled bronchodilator therapy and will add prn bronchodilator therapy Continue scheduled nebulized steroids Repeat CXR in am  Continue antihypertensive and antiarrhythmic medications Continue aspirin Nephrology, Cardiothoracic Surgery, and Cardiology consulted appreciate input  Continuous telemetry monitoring  Trend WBC and monitor fever curve  Continue abx for now Follow cultures Trend BMP  Replace electrolytes as indicated Will stop all iv fluids with CHF hx and the pt is anuric  Will contact Nephrology pt will likely need HD tonight  Continue SSI and levemir   Marda Stalker, [place] Pager [phone] (please enter 7 digits) PCCM  Consult Pager [phone] (please enter 7 digits)

## 2017-01-06 NOTE — Interval H&P Note (Signed)
History and Physical Interval Note:  01/06/2017 12:35 PM  Calvin Good  has presented today for surgery, with the diagnosis of RECURRENT PLEURAL EFFUSION  The various methods of treatment have been discussed with the patient and family. After consideration of risks, benefits and other options for treatment, the patient has consented to  Procedure(s): VIDEO ASSISTED THORACOSCOPY (VATS) WITH TALC (Right) PLEURX CATH INSERTION (N/A) as a surgical intervention .  The patient's history has been reviewed, patient examined, no change in status, stable for surgery.  I have reviewed the patient's chart and labs.  Questions were answered to the patient's satisfaction.     Nestor Lewandowsky

## 2017-01-06 NOTE — Progress Notes (Signed)
Case discussed with pulmonary/critical care, pt with recurrent pulmonary edema, will proceed with dialysis tonight as well as tomorrow morning.

## 2017-01-06 NOTE — H&P (View-Only) (Signed)
  Patient ID: Calvin Good, male   DOB: 1940/07/20, 77 y.o.   MRN: [id number]  HISTORY: Overall he feels better today. He is not as short of breath. Yesterday he states he was unable to complete his dialysis because he felt unwell. He describes this as a feeling of just overwhelming fatigue. Today he feels more energetic and has no pain.   Vitals:   01/02/17 0415 01/02/17 0914  BP: (!) 173/45 (!) 143/43  Pulse: 80 75  Resp: 18 19  Temp: 98 F (36.7 C)   SpO2: 92% 94%     EXAM:    Resp: Lungs are clear on the left side but diminished on the right..  No respiratory distress, normal effort. Heart:  Regular without murmurs Abd:  Abdomen is soft, non distended and non tender. No masses are palpable.  There is no rebound and no guarding.  Neurological: Alert and oriented to person, place, and time. Coordination normal.  Skin: Skin is warm and dry. No rash noted. No diaphoretic. No erythema. No pallor.  Psychiatric: Normal mood and affect. Normal behavior. Judgment and thought content normal.    ASSESSMENT: I have independently reviewed his chest x-ray. This shows a moderate size right pleural effusion. The left side looks good.   PLAN:   I discussed his care today with Dr. Lisbeth Renshaw. He was seen by our anesthesiologist yesterday and he is prepared for surgery on Monday. We plan to perform a thoracoscopic talc pleurodesis. The patient has declined a Pleurx catheter. He remained in the hospital several days after his talc pleurodesis procedure. He will need to undergo dialysis Monday morning as his procedure scheduled at 1:00 in the afternoon. He will also need his heparin discontinued prior to the procedure.     Nestor Lewandowsky, MD

## 2017-01-06 NOTE — OR Nursing (Signed)
1400 ml of pleural fluid removed during surgery.

## 2017-01-06 NOTE — Anesthesia Postprocedure Evaluation (Signed)
Anesthesia Post Note  Patient: [name]  Procedure(s) Performed: PREOPERATIVE BRONCHOSCOPY, RIGHT VIDEO ASSISTED THORACOSCOPY (VATS) WITH TALC PLEURODESIS (Right )  Patient location during evaluation: PACU Anesthesia Type: General Level of consciousness: awake and alert Pain management: pain level controlled Vital Signs Assessment: post-procedure vital signs reviewed and stable Respiratory status: spontaneous breathing, nonlabored ventilation, respiratory function stable and patient connected to face mask oxygen Cardiovascular status: blood pressure returned to baseline and stable Postop Assessment: no apparent nausea or vomiting Anesthetic complications: no     Last Vitals:  Vitals:   01/06/17 1644 01/06/17 1649  BP:  (!) 128/49  Pulse: 70 71  Resp: 16 11  Temp:    SpO2: (!) 85% 93%    Last Pain:  Vitals:   01/06/17 1649  TempSrc:   PainSc: Asleep                 Precious Haws Maddelynn Moosman

## 2017-01-06 NOTE — Progress Notes (Signed)
PT Attempt Note  Patient Details Name: Calvin Good MRN: [id number] DOB: [birth year]   Attempt Treatment:    Reason Eval/Treat Not Completed: Patient at procedure or test/unavailable. Pt currently out of room for pleurx cath insertion. Will attempt PT treatment on later date/time as pt is available.  Lyndel Safe Meylin Stenzel PT, DPT   Wendell Nicoson 01/06/2017, 1:57 PM

## 2017-01-06 NOTE — Progress Notes (Signed)
ANTICOAGULATION CONSULT NOTE -  Follow UP   Pharmacy Consult for heparin drip Indication: chest pain/ACS/elevated troponin  Allergies  Allergen Reactions  . Tetracyclines & Related Other (See Comments)    Reaction: Unknown     Patient Measurements: Height: 5\' 8"  (172.7 cm) Weight: 220 lb (99.8 kg) IBW/kg (Calculated) : 68.4 Heparin Dosing Weight: 88 kg  Vital Signs: Temp: 96.8 F (36 C) (10/08 1225) Temp Source: Tympanic (10/08 1225) BP: 130/46 (10/08 1225) Pulse Rate: 64 (10/08 1225)  Labs:  Recent Labs  01/03/17 2341 01/04/17 0848 01/04/17 1659 01/05/17 0543 01/06/17 0539  HGB 9.1* 9.7*  --  9.1* 9.4*  HCT 26.7* 28.4*  --  26.9* 26.9*  PLT 122* 109*  --  105* 114*  HEPARINUNFRC 0.19* 0.55 0.48 0.51 <0.10*  CREATININE 7.26*  --   --   --  5.74*    Estimated Creatinine Clearance: 12.2 mL/min (A) (by C-G formula based on SCr of 5.74 mg/dL (H)).   Medical History: Past Medical History:  Diagnosis Date  . Cancer [facility])    Prostate  . CHF (congestive heart failure) ([place])   . Chronic kidney disease   . Complication of anesthesia    hard to wake up  . COPD (chronic obstructive pulmonary disease) ([place])   . Diabetes mellitus without complication ([place])   . Difficult intubation   . Dyspnea   . GERD (gastroesophageal reflux disease)   . Hyperlipidemia   . Hypertension   . Pleural effusion   . Renal insufficiency     Medications:  No anticoagulation in PTA meds  Assessment:  Goal of Therapy:  Heparin level 0.3-0.7 units/ml Monitor platelets by anticoagulation protocol: Yes   Plan:  HL drawn this morning was undetectable. According to [facility], heparin drip was stopped at 0258 this morning for procedure today. Patient is currently undergoing procedure.   Per discussion with MD will discontinue heparin drip at this time as it will not be resumed post-procedure.  Lenis Noon, PharmD, BCPS 01/06/17 2:33 PM

## 2017-01-06 NOTE — Progress Notes (Signed)
[place], [place] 01/06/17  Subjective:  Patient completed hemodialysis yesterday.  he is currently sitting up in bed. Ultrafiltration achieved was 2 kg yesterday.   Objective:  Vital signs in last 24 hours:  Temp:  [97.6 F (36.4 C)-98.7 F (37.1 C)] 98.7 F (37.1 C) (10/08 0840) Pulse Rate:  [65-77] 72 (10/08 0840) Resp:  [10-20] 20 (10/08 0840) BP: (127-154)/(39-63) 148/41 (10/08 0840) SpO2:  [91 %-98 %] 91 % (10/08 0840) Weight:  [100.1 kg (220 lb 11.2 oz)] 100.1 kg (220 lb 11.2 oz) (10/08 0303)  Weight change: 0.658 kg (1 lb 7.2 oz) Filed Weights   01/05/17 0444 01/05/17 1030 01/06/17 0303  Weight: 101.7 kg (224 lb 4.8 oz) 102.4 kg (225 lb 12 oz) 100.1 kg (220 lb 11.2 oz)    Intake/Output:    Intake/Output Summary (Last 24 hours) at 01/06/17 1052 Last data filed at 01/05/17 1400  Gross per 24 hour  Intake                0 ml  Output             2000 ml  Net            -2000 ml     Physical Exam: General: Chronically ill-appearing  HEENT moist oral mucus membranes  Neck supple  Pulm/lungs Scattered rhonchi bilatera.  CVS/Heart Irregular  Abdomen:  Soft, nontender, BS present  Extremities: + peripheral edema  Neurologic: Alert and oriented x 3 follows commands  Skin: No acute rashes  access Left arm AV fistula       Basic Metabolic Panel:    Recent Labs Lab 12/31/16 0151 01/01/17 0914 01/02/17 1142 01/03/17 0605 01/03/17 2341 01/06/17 0539  NA 133* 128*  --  131* 130* 134*  K 3.9 4.0  --  4.4 4.2 4.5  CL 94* 90*  --  93* 91* 96*  CO2 28 26  --  26 25 27   GLUCOSE 403* 270*  --  223* 212* 195*  BUN 44* 59*  --  62* 81* 53*  CREATININE 4.52* 6.39*  --  6.26* 7.26* 5.74*  CALCIUM 7.9* 8.1*  --  8.4* 8.2* 8.3*  PHOS  --   --  5.0*  --   --   --      CBC:  Recent Labs Lab 01/03/17 0605 01/03/17 2341 01/04/17 0848 01/05/17 0543 01/06/17 0539  WBC 9.8 7.9 7.3 7.0 6.5  HGB 10.1* 9.1* 9.7* 9.1* 9.4*  HCT  28.9* 26.7* 28.4* 26.9* 26.9*  MCV 86.9 85.9 85.9 85.7 86.9  PLT 125* 122* 109* 105* 114*      Lab Results  Component Value Date   HEPBSAG Negative 11/27/2016      Microbiology:  Recent Results (from the past 240 hour(s))  Blood Culture (routine x 2)     Status: None   Collection Time: 12/30/16  4:28 PM  Result Value Ref Range Status   Specimen Description BLOOD RIGHT ANTECUBITAL  Final   Special Requests   Final    BOTTLES DRAWN AEROBIC AND ANAEROBIC Blood Culture adequate volume   Culture NO GROWTH 5 DAYS  Final   Report Status 01/04/2017 FINAL  Final  Blood Culture (routine x 2)     Status: None   Collection Time: 12/30/16  4:33 PM  Result Value Ref Range Status   Specimen Description BLOOD BLOOD RIGHT HAND  Final   Special Requests   Final    BOTTLES DRAWN AEROBIC  AND ANAEROBIC Blood Culture adequate volume   Culture NO GROWTH 5 DAYS  Final   Report Status 01/04/2017 FINAL  Final  MRSA PCR Screening     Status: None   Collection Time: 12/31/16 12:50 AM  Result Value Ref Range Status   MRSA by PCR NEGATIVE NEGATIVE Final    Comment:        The GeneXpert MRSA Assay (FDA approved for NASAL specimens only), is one component of a comprehensive MRSA colonization surveillance program. It is not intended to diagnose MRSA infection nor to guide or monitor treatment for MRSA infections.     Coagulation Studies: No results for input(s): LABPROT, INR in the last 72 hours.  Urinalysis: No results for input(s): COLORURINE, LABSPEC, PHURINE, GLUCOSEU, HGBUR, BILIRUBINUR, KETONESUR, PROTEINUR, UROBILINOGEN, NITRITE, LEUKOCYTESUR in the last 72 hours.  Invalid input(s): APPERANCEUR    Imaging: Dg Chest 2 View  Result Date: 01/06/2017 CLINICAL DATA:  Pneumonia, prostate cancer. EXAM: CHEST  2 VIEW COMPARISON:  01/02/2017 and PET 10/22/2016.  CT chest 10/17/2016. FINDINGS: Trachea is midline. Heart is mildly enlarged. Thoracic aorta is calcified. Mild initial prominence  with bibasilar airspace opacification, right greater than left. Small right pleural effusion. Tiny left pleural effusion. IMPRESSION: 1. Mild CHF. 2. Right lower lobe collapse/consolidation may be due to pneumonia. Overall aeration at the right lung base is slightly improved from 01/02/2017. 3. Left lower lobe atelectasis. 4.  Aortic atherosclerosis (ICD10-170.0). Electronically Signed   By: Lorin Picket M.D.   On: 01/06/2017 07:43     Medications:   . sodium chloride    . heparin Stopped (01/06/17 0258)  . [START ON 01/13/2017] vancomycin     . amiodarone  400 mg Oral Daily  . aspirin EC  81 mg Oral Daily  . atorvastatin  20 mg Oral QHS  . budesonide (PULMICORT) nebulizer solution  0.5 mg Nebulization BID  . calcium acetate  667 mg Oral TID WC  . carvedilol  6.25 mg Oral BID  . chlorpheniramine-HYDROcodone  5 mL Oral Q12H  . doxazosin  8 mg Oral QPM  . epoetin (EPOGEN/PROCRIT) injection  4,000 Units Intravenous Q M,W,F-HD  . feeding supplement (NEPRO CARB STEADY)  237 mL Oral BID BM  . fluticasone  1 spray Each Nare Daily  . furosemide  40 mg Oral BID  . guaiFENesin  600 mg Oral BID  . insulin aspart  0-5 Units Subcutaneous QHS  . insulin aspart  0-9 Units Subcutaneous TID WC  . insulin aspart  3 Units Subcutaneous TID WC  . insulin detemir  20 Units Subcutaneous QHS  . ipratropium-albuterol  3 mL Nebulization Q6H  . isosorbide mononitrate  60 mg Oral Daily  . levofloxacin  500 mg Oral Q48H  . lisinopril  20 mg Oral QPM  . pantoprazole  40 mg Oral Daily  . sodium chloride flush  3 mL Intravenous Q12H  . spironolactone  25 mg Oral BID   sodium chloride, acetaminophen **OR** acetaminophen, benzonatate, fentaNYL (SUBLIMAZE) injection, HYDROcodone-acetaminophen, lidocaine-prilocaine, phenylephrine, senna-docusate, sodium chloride, sodium chloride flush, traMADol  Assessment/ Plan:  76 y.o. male with diabetes mellitus type II insulin dependent, hypertension, coronary artery  disease, hyperlipidemia, prostate cancer status post prostatectomy, right total knee, appendectomy.   MWF [facility] [place] [facility]  1. End Stage Renal Disease MWF:  - Patient completed hemodialysis yesterday. No acute indication for dialysis at the moment. We will plan for dialysis again tomorrow.  2. Anemia of chronic kidney disease/Acute GI bleed 11/26/16.  Hemoglobin  currently 9.4 and stable.  Continue Epogen 4000 units IV with dialysis.  3. 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  - Management as per hospitalist.    4. Secondary Hyperparathyroidism:  - Continue PhosLo one tablet by mouth 3 times a day with meals.  5. A Fib with RVR:  Patient currently on amiodarone.   LOS: [place] 10/8/201810:52 AM  [place], [place]

## 2017-01-06 NOTE — Progress Notes (Signed)
PT Cancellation Note  Patient Details Name: Calvin Good MRN: [id number] DOB: 1940/11/01   Cancelled Treatment:    Reason Eval/Treat Not Completed: Medical issues which prohibited therapy (Per chart review, patient status post procedure under general anesthesia (R thoracoscopy with talc pleurodesis) with transfer to CCU post procedure (due to respiratory failure requiring BiPAP).  Per policy, will require new orders to resume PT services.  Please re-order as medically appropriate.)   Becky Colan H. Owens Shark, PT, DPT, NCS 01/06/17, 10:10 PM [phone]

## 2017-01-06 NOTE — Anesthesia Procedure Notes (Signed)
Procedure Name: Intubation Date/Time: 01/06/2017 1:48 PM Performed by: Justus [facility] Pre-anesthesia Checklist: Patient identified, Emergency Drugs available, Suction available, Patient being monitored and Timeout performed Patient Re-evaluated:Patient Re-evaluated prior to induction Oxygen Delivery Method: Circle system utilized Preoxygenation: Pre-oxygenation with 100% oxygen Induction Type: IV induction Ventilation: Mask ventilation with difficulty Laryngoscope Size: Mac and 4 Grade View: Grade II Endobronchial tube: Left, Double lumen EBT and EBT position confirmed by fiberoptic bronchoscope and 37 Fr Number of attempts: 1 Airway Equipment and Method: Patient positioned with wedge pillow and Stylet Placement Confirmation: ETT inserted through vocal cords under direct vision,  positive ETCO2,  CO2 detector and breath sounds checked- equal and bilateral Secured at: 27 (teeth) cm Dental Injury: Teeth and Oropharynx as per pre-operative assessment  Difficulty Due To: Difficulty was anticipated and Difficult Airway- due to anterior larynx Future Recommendations: Recommend- induction with short-acting agent, and alternative techniques readily available

## 2017-01-07 ENCOUNTER — Inpatient Hospital Stay: Payer: Medicare Other

## 2017-01-07 ENCOUNTER — Encounter: Payer: Self-pay | Admitting: Cardiothoracic Surgery

## 2017-01-07 DIAGNOSIS — J90 Pleural effusion, not elsewhere classified: Principal | ICD-10-CM

## 2017-01-07 DIAGNOSIS — J9601 Acute respiratory failure with hypoxia: Secondary | ICD-10-CM

## 2017-01-07 DIAGNOSIS — Z992 Dependence on renal dialysis: Secondary | ICD-10-CM

## 2017-01-07 DIAGNOSIS — N186 End stage renal disease: Secondary | ICD-10-CM

## 2017-01-07 DIAGNOSIS — I480 Paroxysmal atrial fibrillation: Secondary | ICD-10-CM

## 2017-01-07 LAB — CBC WITH DIFFERENTIAL/PLATELET
Basophils Absolute: 0 10*3/uL (ref 0–0.1)
Basophils Relative: 0 %
EOS PCT: 0 %
Eosinophils Absolute: 0 10*3/uL (ref 0–0.7)
HEMATOCRIT: 28.3 % — AB (ref 40.0–52.0)
Hemoglobin: 9.5 g/dL — ABNORMAL LOW (ref 13.0–18.0)
LYMPHS ABS: 0.8 10*3/uL — AB (ref 1.0–3.6)
LYMPHS PCT: 10 %
MCH: 29.8 pg (ref 26.0–34.0)
MCHC: 33.5 g/dL (ref 32.0–36.0)
MCV: 89 fL (ref 80.0–100.0)
MONO ABS: 0.7 10*3/uL (ref 0.2–1.0)
Monocytes Relative: 10 %
NEUTROS ABS: 6.3 10*3/uL (ref 1.4–6.5)
Neutrophils Relative %: 80 %
PLATELETS: 131 10*3/uL — AB (ref 150–440)
RBC: 3.18 MIL/uL — AB (ref 4.40–5.90)
RDW: 15 % — ABNORMAL HIGH (ref 11.5–14.5)
WBC: 7.9 10*3/uL (ref 3.8–10.6)

## 2017-01-07 LAB — GLUCOSE, CAPILLARY
GLUCOSE-CAPILLARY: 250 mg/dL — AB (ref 65–99)
Glucose-Capillary: 111 mg/dL — ABNORMAL HIGH (ref 65–99)
Glucose-Capillary: 156 mg/dL — ABNORMAL HIGH (ref 65–99)
Glucose-Capillary: 120 mg/dL — ABNORMAL HIGH (ref 65–99)
Glucose-Capillary: 199 mg/dL — ABNORMAL HIGH (ref 65–99)

## 2017-01-07 LAB — BASIC METABOLIC PANEL
Anion gap: 11 (ref 5–15)
BUN: 42 mg/dL — AB (ref 6–20)
CO2: 26 mmol/L (ref 22–32)
Calcium: 8.2 mg/dL — ABNORMAL LOW (ref 8.9–10.3)
Chloride: 98 mmol/L — ABNORMAL LOW (ref 101–111)
Creatinine, Ser: 5.49 mg/dL — ABNORMAL HIGH (ref 0.61–1.24)
GFR calc Af Amer: 10 mL/min — ABNORMAL LOW (ref >60)
GFR, EST NON AFRICAN AMERICAN: 9 mL/min — AB (ref >60)
GLUCOSE: 155 mg/dL — AB (ref 65–99)
POTASSIUM: 4.8 mmol/L (ref 3.5–5.1)
Sodium: 135 mmol/L (ref 135–145)

## 2017-01-07 LAB — CYTOLOGY - NON PAP

## 2017-01-07 LAB — PHOSPHORUS: Phosphorus: 5.6 mg/dL — ABNORMAL HIGH (ref 2.5–4.6)

## 2017-01-07 MED ORDER — DEXTROSE 5 % IV SOLN
2.0000 g | INTRAVENOUS | Status: DC
  Administered 2017-01-07 – 2017-01-08 (×2): 2 g via INTRAVENOUS
  Filled 2017-01-07 (×4): qty 2

## 2017-01-07 MED ORDER — ALTEPLASE 2 MG IJ SOLR: 2.0000 mg | Freq: Once | INTRAMUSCULAR | Status: DC | PRN

## 2017-01-07 MED ORDER — HEPARIN SODIUM (PORCINE) 5000 UNIT/ML IJ SOLN
5000.0000 [IU] | Freq: Three times a day (TID) | INTRAMUSCULAR | Status: DC
  Administered 2017-01-07 – 2017-01-14 (×16): 5000 [IU] via SUBCUTANEOUS
  Filled 2017-01-07 (×16): qty 1

## 2017-01-07 MED ORDER — METHYLPREDNISOLONE SODIUM SUCC 40 MG IJ SOLR
20.0000 mg | Freq: Two times a day (BID) | INTRAMUSCULAR | Status: DC
  Administered 2017-01-07 – 2017-01-11 (×9): 20 mg via INTRAVENOUS
  Filled 2017-01-07 (×9): qty 1

## 2017-01-07 MED ORDER — LIDOCAINE-PRILOCAINE 2.5-2.5 % EX CREA
1.0000 "application " | TOPICAL_CREAM | CUTANEOUS | Status: DC | PRN
  Administered 2017-01-07: 1 via TOPICAL
  Filled 2017-01-07: qty 5

## 2017-01-07 MED ORDER — PENTAFLUOROPROP-TETRAFLUOROETH EX AERO
1.0000 "application " | INHALATION_SPRAY | CUTANEOUS | Status: DC | PRN
  Filled 2017-01-07: qty 30

## 2017-01-07 MED ORDER — HEPARIN SODIUM (PORCINE) 1000 UNIT/ML DIALYSIS
1000.0000 [IU] | INTRAMUSCULAR | Status: DC | PRN
  Filled 2017-01-07: qty 1

## 2017-01-07 MED ORDER — IPRATROPIUM-ALBUTEROL 0.5-2.5 (3) MG/3ML IN SOLN
3.0000 mL | RESPIRATORY_TRACT | Status: DC
  Administered 2017-01-07 – 2017-01-08 (×6): 3 mL via RESPIRATORY_TRACT
  Filled 2017-01-07 (×6): qty 3

## 2017-01-07 MED ORDER — HALOPERIDOL LACTATE 5 MG/ML IJ SOLN
2.5000 mg | Freq: Once | INTRAMUSCULAR | Status: AC
  Administered 2017-01-07: 2.5 mg via INTRAVENOUS
  Filled 2017-01-07: qty 1

## 2017-01-07 MED ORDER — LIDOCAINE HCL (PF) 1 % IJ SOLN
5.0000 mL | INTRAMUSCULAR | Status: DC | PRN
  Filled 2017-01-07: qty 5

## 2017-01-07 MED ORDER — SODIUM CHLORIDE 0.9 % IV SOLN: 100.0000 mL | INTRAVENOUS | Status: DC | PRN

## 2017-01-07 NOTE — Procedures (Signed)
Arrived to pt room at or about 2300 and pt was assessed prior to tx and found to have a LUA AVF, which was prepared per p&p and cannulated w 15 ga needles w issue to venous site r/t clot in needle, but next cannulation was w/o issue; order includes RTD 2 hours on 3251 bath, BFR 400, DFR 600 and attempt to achieve 2 kg fluid removal as tolerated by pt; pt initiated at 2345 w/o issue; pt completed his entire tx and achieved target of 2 kg fluid removal w reduced BFR r/t pt moving access arm frequently; pt started tx on bipap and was able to progress to [facility] at the end of tx; all blood was rinsed back and needles aseptically de-cannulated and pressure dressings applied; hemostasis achieved w/o prolonged bleeding and pt was stable at the end of his tx and when this nurse departed; report given to primary RN

## 2017-01-07 NOTE — Progress Notes (Signed)
Awake and alert.  No real complaints.  Chest tube draining serous fluid  No air leak seen  CXRay reviewed.  Collapse of right lower and middle lobes.  Question if bronch may help clear secretions.  Will leave chest tubes to suction for the time being.  [employer].

## 2017-01-07 NOTE — Progress Notes (Signed)
Pre dialysis assessment 

## 2017-01-07 NOTE — Progress Notes (Signed)
Pt keeps desat to 89% but he does recover only back up to 90%, advise to put bipap on, pt continues to refuse. Will continue to monitor.

## 2017-01-07 NOTE — Progress Notes (Signed)
Pharmacy Antibiotic Note  Calvin Good is a 76 y.o. male admitted on 12/30/2016 with respiratory failure. Pharmacy has been consulted for cefepime dosing to treat HCAP.  Plan: Discontinue levofloxacin due to amiodarone drug interaction causing increased risk of QTc prolongation. Initiate cefepime 2 g IV Q 24 hours.    Height: 5\' 8"  (172.7 cm) Weight: 220 lb 7.4 oz (100 kg) IBW/kg (Calculated) : 68.4  Temp (24hrs), Avg:98.7 F (37.1 C), Min:97.2 F (36.2 C), Max:100.7 F (38.2 C)   Recent Labs Lab 01/03/17 0605 01/03/17 2341 01/04/17 0848 01/05/17 0543 01/06/17 0539 01/06/17 1830 01/07/17 0537  WBC 9.8 7.9 7.3 7.0 6.5 10.1 7.9  CREATININE 6.26* 7.26*  --   --  5.74* 6.61* 5.49*    Estimated Creatinine Clearance: 12.7 mL/min (A) (by C-G formula based on SCr of 5.49 mg/dL (H)).    Allergies  Allergen Reactions  . Tetracyclines & Related Other (See Comments)    Reaction: Unknown     Antimicrobials this admission: Vancomycin 10/8 >> 10/8 Cefuroxime 10/8 >> 10/9 (surgical prophylaxis) Levofloxacin 10/6 >> 10/9 Cefepime 10/9 >>  Dose adjustments this admission:   Microbiology results: 10/6 BCx: no growth 5 days 10/2 MRSA PCR: negative  Thank you for allowing pharmacy to be a part of this patient's care.  Oralia Manis 01/07/2017 1:43 PM

## 2017-01-07 NOTE — Progress Notes (Signed)
[facility], [place] 01/07/17  Subjective:  Patient transitioned to CCU overnight. He did have thoracentesis yesterday. However he was too short of breath after the procedure and therefore we were requested to perform dialysis. Patient currently on nasal cannula this a.m.   Objective:  Vital signs in last 24 hours:  Temp:  [96.8 F (36 C)-100.7 F (38.2 C)] 100.7 F (38.2 C) (10/09 0824) Pulse Rate:  [64-96] 94 (10/09 0700) Resp:  [6-25] 15 (10/09 0800) BP: (118-160)/(40-74) 141/41 (10/09 0800) SpO2:  [80 %-100 %] 92 % (10/09 0800) FiO2 (%):  [80 %-100 %] 80 % (10/09 0115) Weight:  [98.6 kg (217 lb 6 oz)-99.8 kg (220 lb)] 98.6 kg (217 lb 6 oz) (10/09 0200)  Weight change: -2.609 kg (-5 lb 12 oz) Filed Weights   01/06/17 0303 01/06/17 1259 01/07/17 0200  Weight: 100.1 kg (220 lb 11.2 oz) 99.8 kg (220 lb) 98.6 kg (217 lb 6 oz)    Intake/Output:    Intake/Output Summary (Last 24 hours) at 01/07/17 0835 Last data filed at 01/07/17 0643  Gross per 24 hour  Intake          1006.17 ml  Output              630 ml  Net           376.17 ml     Physical Exam: General: Chronically ill-appearing  HEENT moist oral mucus membranes  Neck supple  Pulm/lungs Scattered rhonchi bilateral, normal effort  CVS/Heart Irregular  Abdomen:  Soft, nontender, BS present  Extremities: + peripheral edema  Neurologic: Alert and oriented x 3 follows commands  Skin: No acute rashes  access Left arm AV fistula       Basic Metabolic Panel:    Recent Labs Lab 01/02/17 1142 01/03/17 0605 01/03/17 2341 01/06/17 0539 01/06/17 1830 01/07/17 0537  NA  --  131* 130* 134* 133* 135  K  --  4.4 4.2 4.5 5.0 4.8  CL  --  93* 91* 96* 94* 98*  CO2  --  26 25 27 25 26   GLUCOSE  --  223* 212* 195* 185* 155*  BUN  --  62* 81* 53* 57* 42*  CREATININE  --  6.26* 7.26* 5.74* 6.61* 5.49*  CALCIUM  --  8.4* 8.2* 8.3* 8.6* 8.2*  PHOS 5.0*  --   --   --   --   --       CBC:  Recent Labs Lab 01/04/17 0848 01/05/17 0543 01/06/17 0539 01/06/17 1830 01/07/17 0537  WBC 7.3 7.0 6.5 10.1 7.9  NEUTROABS  --   --   --   --  6.3  HGB 9.7* 9.1* 9.4* 10.6* 9.5*  HCT 28.4* 26.9* 26.9* 31.8* 28.3*  MCV 85.9 85.7 86.9 88.7 89.0  PLT 109* 105* 114* 137* 131*      Lab Results  Component Value Date   HEPBSAG Negative 11/27/2016      Microbiology:  Recent Results (from the past 240 hour(s))  Blood Culture (routine x 2)     Status: None   Collection Time: 12/30/16  4:28 PM  Result Value Ref Range Status   Specimen Description BLOOD RIGHT ANTECUBITAL  Final   Special Requests   Final    BOTTLES DRAWN AEROBIC AND ANAEROBIC Blood Culture adequate volume   Culture NO GROWTH 5 DAYS  Final   Report Status 01/04/2017 FINAL  Final  Blood Culture (routine x 2)  Status: None   Collection Time: 12/30/16  4:33 PM  Result Value Ref Range Status   Specimen Description BLOOD BLOOD RIGHT HAND  Final   Special Requests   Final    BOTTLES DRAWN AEROBIC AND ANAEROBIC Blood Culture adequate volume   Culture NO GROWTH 5 DAYS  Final   Report Status 01/04/2017 FINAL  Final  MRSA PCR Screening     Status: None   Collection Time: 12/31/16 12:50 AM  Result Value Ref Range Status   MRSA by PCR NEGATIVE NEGATIVE Final    Comment:        The GeneXpert MRSA Assay (FDA approved for NASAL specimens only), is one component of a comprehensive MRSA colonization surveillance program. It is not intended to diagnose MRSA infection nor to guide or monitor treatment for MRSA infections.     Coagulation Studies: No results for input(s): LABPROT, INR in the last 72 hours.  Urinalysis: No results for input(s): COLORURINE, LABSPEC, PHURINE, GLUCOSEU, HGBUR, BILIRUBINUR, KETONESUR, PROTEINUR, UROBILINOGEN, NITRITE, LEUKOCYTESUR in the last 72 hours.  Invalid input(s): APPERANCEUR    Imaging: Dg Chest 2 View  Result Date: 01/06/2017 CLINICAL DATA:  Pneumonia,  prostate cancer. EXAM: CHEST  2 VIEW COMPARISON:  01/02/2017 and PET 10/22/2016.  CT chest 10/17/2016. FINDINGS: Trachea is midline. Heart is mildly enlarged. Thoracic aorta is calcified. Mild initial prominence with bibasilar airspace opacification, right greater than left. Small right pleural effusion. Tiny left pleural effusion. IMPRESSION: 1. Mild CHF. 2. Right lower lobe collapse/consolidation may be due to pneumonia. Overall aeration at the right lung base is slightly improved from 01/02/2017. 3. Left lower lobe atelectasis. 4.  Aortic atherosclerosis (ICD10-170.0). Electronically Signed   By: Lorin Picket M.D.   On: 01/06/2017 07:43   Dg Chest Port 1 View  Result Date: 01/07/2017 CLINICAL DATA:  Respiratory failure. EXAM: PORTABLE CHEST 1 VIEW COMPARISON:  01/06/2017. FINDINGS: Tubing noted over the right chest with tip over the lower right chest as previously noted in unchanged position. This may represent a chest tube. Improvement of mediastinal prominence. Stable cardiomegaly. Progressive bilateral lower lobe atelectasis. Left base infiltrate may be present. Small left pleural effusion may be present. No pneumothorax. Degenerative changes thoracic spine. IMPRESSION: 1. Tubing noted over the right chest with tip over the lower right chest as previously noted is in unchanged position. This may represent a chest tube. No pneumothorax. 2.  Interim improvement of mediastinal prominence. 3. Progressive bilateral lower lobe atelectasis. Left base infiltrate cannot be excluded. Small left pleural effusion cannot be excluded. 4. Stable cardiomegaly. Electronically Signed   By: Marcello Moores  Register   On: 01/07/2017 06:17   Dg Chest Port 1 View  Result Date: 01/06/2017 CLINICAL DATA:  Postoperative radiograph. EXAM: PORTABLE CHEST 1 VIEW COMPARISON:  01/06/2017 FINDINGS: Right-sided catheter loops over the lower thorax, tip in uncertain location. Cardiomediastinal silhouette is stably enlarged. Mediastinal  contour is enlarged, which may be exaggerated by portable AP technique. Calcific atherosclerotic disease of the aorta. Bilateral lower lobe airspace consolidation. Osseous structures are without acute abnormality. Soft tissues are grossly normal. IMPRESSION: Right-sided catheter with tip overlying right lower thorax/ upper abdomen, in uncertain location. Apparent enlargement of the mediastinum, query postsurgical. Please correlate clinically. Bilateral lower lobe airspace consolidation. Electronically Signed   By: Fidela Salisbury M.D.   On: 01/06/2017 16:17     Medications:   . sodium chloride Stopped (01/06/17 2100)   . amiodarone  400 mg Oral Daily  . aspirin EC  81 mg Oral  Daily  . atorvastatin  20 mg Oral QHS  . budesonide (PULMICORT) nebulizer solution  0.5 mg Nebulization BID  . calcium acetate  667 mg Oral TID WC  . carvedilol  6.25 mg Oral BID  . chlorpheniramine-HYDROcodone  5 mL Oral Q12H  . doxazosin  8 mg Oral QPM  . epoetin (EPOGEN/PROCRIT) injection  4,000 Units Intravenous Q M,W,F-HD  . feeding supplement (NEPRO CARB STEADY)  237 mL Oral BID BM  . fluticasone  1 spray Each Nare Daily  . furosemide  40 mg Oral BID  . guaiFENesin  600 mg Oral BID  . insulin aspart  0-5 Units Subcutaneous QHS  . insulin aspart  0-9 Units Subcutaneous TID WC  . insulin aspart  3 Units Subcutaneous TID WC  . insulin detemir  20 Units Subcutaneous QHS  . ipratropium-albuterol  3 mL Nebulization Q4H  . isosorbide mononitrate  60 mg Oral Daily  . levofloxacin  500 mg Oral Q48H  . lisinopril  20 mg Oral QPM  . pantoprazole  40 mg Oral Daily  . sodium chloride flush  3 mL Intravenous Q12H  . spironolactone  25 mg Oral BID   sodium chloride, acetaminophen **OR** acetaminophen, benzonatate, fentaNYL (SUBLIMAZE) injection, HYDROcodone-acetaminophen, ipratropium-albuterol, lidocaine-prilocaine, lidocaine-prilocaine, morphine injection, ondansetron (ZOFRAN) IV, phenylephrine, senna-docusate,  sodium chloride, sodium chloride flush, traMADol  Assessment/ Plan:  76 y.o. male with diabetes mellitus type II insulin dependent, hypertension, coronary artery disease, hyperlipidemia, prostate cancer status post prostatectomy, right total knee, appendectomy.   MWF [facility] AVF  1. End Stage Renal Disease MWF:  - Patient had dialysis late yesterday evening. We will plan for another dialysis session today as well to help get additional fluid off.  2. Anemia of chronic kidney disease. - Hemoglobin currently 9.5. We will maintain the patient on Epogen during dialysis treatments.   3. 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  - Management as per hospitalist.    4. Secondary Hyperparathyroidism:  - Follow-up serum phosphorus today. Continue PhosLo otherwise.  5. A Fib with RVR:  Continue amiodarone 400 mg by mouth daily.  6. Acute respiratory failure. Patient did require BiPAP for a short period of time yesterday. Now off. Continue to monitor respirations status. Additional dialysis today.   LOS: [place], Valdis Bevill 10/9/20188:35 AM  [place], [place]

## 2017-01-07 NOTE — Progress Notes (Signed)
Post hd assessment unchanged. Patient in no distress. Remains on 5L 02 [name]

## 2017-01-07 NOTE — Progress Notes (Signed)
[facility] [place] NAME: Calvin Good    MR#:  [id number]  DATE OF BIRTH:  [birth year]  SUBJECTIVE:  CHIEF COMPLAINT:   Chief Complaint  Patient presents with  . Chest Pain  - Seen post procedure- had SOB and Hypoxia required Oxygen via mask and Bipap.  REVIEW OF SYSTEMS:  Review of Systems  Constitutional: Positive for malaise/fatigue. Negative for chills and fever.  HENT: Positive for congestion. Negative for ear discharge, hearing loss and nosebleeds.   Eyes: Negative for blurred vision and double vision.  Respiratory: Positive for cough and shortness of breath. Negative for wheezing.   Cardiovascular: Positive for orthopnea. Negative for chest pain and palpitations.  Gastrointestinal: Negative for abdominal pain, constipation, diarrhea, nausea and vomiting.  Genitourinary: Negative for dysuria.  Musculoskeletal: Negative for myalgias.  Neurological: Negative for dizziness, speech change, focal weakness, seizures and headaches.  Psychiatric/Behavioral: Negative for depression.    DRUG ALLERGIES:   Allergies  Allergen Reactions  . Tetracyclines & Related Other (See Comments)    Reaction: Unknown     VITALS:  Blood pressure (!) 141/41, pulse 94, temperature (!) 100.7 F (38.2 C), temperature source Axillary, resp. rate 15, height 5\' 8"  (1.727 m), weight 98.6 kg (217 lb 6 oz), SpO2 92 %.  PHYSICAL EXAMINATION:  Physical Exam  GENERAL:  76 y.o.-year-old patient lying in the bed, Appears short of breath with minimal exertion EYES: Pupils equal, round, reactive to light and accommodation. No scleral icterus. Extraocular muscles intact.  HEENT: Head atraumatic, normocephalic. Oropharynx and nasopharynx clear.  NECK:  Supple, no jugular venous distention. No thyroid enlargement, no tenderness.  LUNGS: Normal breath sounds bilaterally, no wheezing, rales,rhonchi or crepitation. No use of accessory muscles of respiration.  Decreased bibasilar breath sounds, chest tube in place, On Bipap CARDIOVASCULAR: S1, S2 normal. No  rubs, or gallops. 2/6 systolic murmur is present ABDOMEN: Soft, nontender, nondistended. Bowel sounds present. No organomegaly or mass.  EXTREMITIES: No pedal edema, cyanosis, or clubbing. Left arm AV fistula present NEUROLOGIC: Cranial nerves II through XII are intact. Muscle strength 4-5/5 in all extremities. Sensation intact. Gait not checked.  PSYCHIATRIC: The patient is alert and oriented x 2-3.  SKIN: No obvious rash, lesion, or ulcer.    LABORATORY PANEL:   CBC  Recent Labs Lab 01/07/17 0537  WBC 7.9  HGB 9.5*  HCT 28.3*  PLT 131*   ------------------------------------------------------------------------------------------------------------------  Chemistries   Recent Labs Lab 01/07/17 0537  NA 135  K 4.8  CL 98*  CO2 26  GLUCOSE 155*  BUN 42*  CREATININE 5.49*  CALCIUM 8.2*   ------------------------------------------------------------------------------------------------------------------  Cardiac Enzymes  Recent Labs Lab 12/31/16 0941  TROPONINI 0.56*   ------------------------------------------------------------------------------------------------------------------  RADIOLOGY:  Dg Chest 2 View  Result Date: 01/06/2017 CLINICAL DATA:  Pneumonia, prostate cancer. EXAM: CHEST  2 VIEW COMPARISON:  01/02/2017 and PET 10/22/2016.  CT chest 10/17/2016. FINDINGS: Trachea is midline. Heart is mildly enlarged. Thoracic aorta is calcified. Mild initial prominence with bibasilar airspace opacification, right greater than left. Small right pleural effusion. Tiny left pleural effusion. IMPRESSION: 1. Mild CHF. 2. Right lower lobe collapse/consolidation may be due to pneumonia. Overall aeration at the right lung base is slightly improved from 01/02/2017. 3. Left lower lobe atelectasis. 4.  Aortic atherosclerosis (ICD10-170.0). Electronically Signed   By: Lorin Picket  M.D.   On: 01/06/2017 07:43   Dg Chest Port 1 View  Result Date: 01/07/2017 CLINICAL DATA:  Respiratory failure. EXAM:  PORTABLE CHEST 1 VIEW COMPARISON:  01/06/2017. FINDINGS: Tubing noted over the right chest with tip over the lower right chest as previously noted in unchanged position. This may represent a chest tube. Improvement of mediastinal prominence. Stable cardiomegaly. Progressive bilateral lower lobe atelectasis. Left base infiltrate may be present. Small left pleural effusion may be present. No pneumothorax. Degenerative changes thoracic spine. IMPRESSION: 1. Tubing noted over the right chest with tip over the lower right chest as previously noted is in unchanged position. This may represent a chest tube. No pneumothorax. 2.  Interim improvement of mediastinal prominence. 3. Progressive bilateral lower lobe atelectasis. Left base infiltrate cannot be excluded. Small left pleural effusion cannot be excluded. 4. Stable cardiomegaly. Electronically Signed   By: Marcello Moores  Register   On: 01/07/2017 06:17   Dg Chest Port 1 View  Result Date: 01/06/2017 CLINICAL DATA:  Postoperative radiograph. EXAM: PORTABLE CHEST 1 VIEW COMPARISON:  01/06/2017 FINDINGS: Right-sided catheter loops over the lower thorax, tip in uncertain location. Cardiomediastinal silhouette is stably enlarged. Mediastinal contour is enlarged, which may be exaggerated by portable AP technique. Calcific atherosclerotic disease of the aorta. Bilateral lower lobe airspace consolidation. Osseous structures are without acute abnormality. Soft tissues are grossly normal. IMPRESSION: Right-sided catheter with tip overlying right lower thorax/ upper abdomen, in uncertain location. Apparent enlargement of the mediastinum, query postsurgical. Please correlate clinically. Bilateral lower lobe airspace consolidation. Electronically Signed   By: Fidela Salisbury M.D.   On: 01/06/2017 16:17    EKG:   Orders placed or performed during the  hospital encounter of 12/30/16  . ED EKG within 10 minutes  . ED EKG within 10 minutes  . EKG 12-Lead  . EKG 12-Lead    ASSESSMENT AND PLAN:   76 year old male with past medical history significant for end-stage renal disease on hemodialysis, congestive heart failure, COPD on 2L home oxygen, diabetes and hypertension presents to hospital secondary to worsening dyspnea.  #1 paroxysmal atrial fibrillation with rapid ventricular response- -received amiodarone drip, rate is better controlled. -On oral amiodarone at this time and also carvedilol. -Was not on anticoagulation as outpatient due to GI bleed. Currently on heparin drip -Discussed with outpatient cardiologist about discharging on oral anticoagulation, especially due to GI bleed during last hospitalization.  #2 Ac on [place] respi failure with hypoxia due to pleural effusion  acute on chronic combined CHF, last EF of 50-55%.  -Appreciate cardiology consult. Continue oral Lasix at this time. Appears well compensated -History of recurrent right pleural effusion. Appreciate thoraco-surgical consult. - talc pleurodesis done- hold heparin  -Empirically on Levaquin for right lower lobe infiltrate. - Requiring Bipap post-procedure, Monitor in stepdown unit, spoke to Dr. Mortimer Fries.  #3diabetes mellitus-continue Levemir and sliding scale insulin. Also on aspart insulin prior to meals.  #4 end-stage renal disease on hemodialysis-appreciate nephrology input.  On Monday Wednesday Friday dialysis.   #5 COPD-stable at this time. On 3-6L home oxygen -Continue inhalers. Continue outpatient pulmonary follow-up - Due to respi failure post procedure, On Bipap- monitor in step down unit.  #6  CAD-with chronic stable angina. Status post cardiac catheterization in May 2018 with diffuse RCA disease and collaterals LAD, left circumflex 75% stenosis. Troponins were elevated on admission. Cardiology recommended medical management. -Continue Imdur  #7  DVT prophylaxis-on heparin drip at this time    All the records are reviewed and case discussed with Care Management/Social Workerr. Management plans discussed with the patient, family and they are in agreement.  CODE STATUS: Full code  TOTAL  TIME TAKING CARE OF THIS PATIENT: 35 critical care minutes.   POSSIBLE D/C IN 2 DAYS, DEPENDING ON CLINICAL CONDITION.   Vaughan Basta M.D on 01/07/2017 at 8:45 AM  Between 7am to 6pm - Pager - [phone]  After 6pm go to [email or web address] - password [email or web address] [facility]  Office  [phone]  CC: Primary care physician; [name], MD

## 2017-01-07 NOTE — Progress Notes (Signed)
Pre dialysis  

## 2017-01-07 NOTE — Progress Notes (Signed)
HD initiated via L AVF with 15g needles x2 without issue. Labs predrawn by staff prior to HD initiation. No heparin treatment. Time 2.5 hours with goal of 1.5L per MD orders. Patient currently alert. Answers most questions appropriately. On 5L 02 via [place]. Continue to monitor closely.

## 2017-01-07 NOTE — Progress Notes (Signed)
HD completed without issue. Patient tolerated well. Goal met. Report given to ICU RN.  

## 2017-01-07 NOTE — Progress Notes (Signed)
Not wearing bipap

## 2017-01-08 LAB — GLUCOSE, CAPILLARY
GLUCOSE-CAPILLARY: 282 mg/dL — AB (ref 65–99)
GLUCOSE-CAPILLARY: 354 mg/dL — AB (ref 65–99)
Glucose-Capillary: 316 mg/dL — ABNORMAL HIGH (ref 65–99)
Glucose-Capillary: 194 mg/dL — ABNORMAL HIGH (ref 65–99)

## 2017-01-08 LAB — BASIC METABOLIC PANEL
ANION GAP: 13 (ref 5–15)
BUN: 48 mg/dL — ABNORMAL HIGH (ref 6–20)
CHLORIDE: 92 mmol/L — AB (ref 101–111)
CO2: 27 mmol/L (ref 22–32)
Calcium: 8.2 mg/dL — ABNORMAL LOW (ref 8.9–10.3)
Creatinine, Ser: 5.41 mg/dL — ABNORMAL HIGH (ref 0.61–1.24)
GFR calc Af Amer: 11 mL/min — ABNORMAL LOW (ref >60)
GFR, EST NON AFRICAN AMERICAN: 9 mL/min — AB (ref >60)
GLUCOSE: 297 mg/dL — AB (ref 65–99)
POTASSIUM: 4.2 mmol/L (ref 3.5–5.1)
Sodium: 132 mmol/L — ABNORMAL LOW (ref 135–145)

## 2017-01-08 LAB — PARATHYROID HORMONE, INTACT (NO CA): PTH: 101 pg/mL — ABNORMAL HIGH (ref 15–65)

## 2017-01-08 MED ORDER — IPRATROPIUM-ALBUTEROL 0.5-2.5 (3) MG/3ML IN SOLN
3.0000 mL | Freq: Four times a day (QID) | RESPIRATORY_TRACT | Status: DC
  Administered 2017-01-08 – 2017-01-10 (×8): 3 mL via RESPIRATORY_TRACT
  Filled 2017-01-08 (×2): qty 3
  Filled 2017-01-08: qty 30
  Filled 2017-01-08 (×5): qty 3

## 2017-01-08 MED ORDER — RENA-VITE PO TABS
1.0000 | ORAL_TABLET | Freq: Every day | ORAL | Status: DC
  Administered 2017-01-08 – 2017-01-15 (×8): 1 via ORAL
  Filled 2017-01-08 (×10): qty 1

## 2017-01-08 NOTE — Progress Notes (Signed)
  Patient ID: Calvin Good, male   DOB: 03/11/1941, 76 y.o.   MRN: [id number]  HISTORY: He looks much better today and states he feels better. He has only minimal pain whenever he coughs. The chest tube put out 120 cc over the last 24 hours. He is not short of breath.   Vitals:   01/08/17 0500 01/08/17 0600  BP: (!) 131/44 (!) 131/42  Pulse: 71 68  Resp: 11 13  Temp:    SpO2: 96% 96%     EXAM:    Resp: Lungs coarse breath sounds throughout.  No respiratory distress, normal effort. Heart:  Irregular with murmur. Neurological: Alert and oriented to person, place, and time. Coordination normal.  Skin: Skin is warm and dry. No rash noted. No diaphoretic. No erythema. No pallor.  Psychiatric: Normal mood and affect. Normal behavior. Judgment and thought content normal.    ASSESSMENT: Recurrent right-sided pleural effusion status post thoracoscopic talc pleurodesis   PLAN:    I would like to leave his chest tube to 20 cm water suction today. I will continue to monitor the chest tube output. The chest tube can be removed when the drainage is less than 200 cc per day for 2 consecutive days   Nestor Lewandowsky, MD

## 2017-01-08 NOTE — Progress Notes (Signed)
[facility] NAME: Roper Tolson    MR#:  [id number]  DATE OF BIRTH:  [birth year]  SUBJECTIVE:  CHIEF COMPLAINT:   Chief Complaint  Patient presents with  . Chest Pain  - s/p VATs and pleurodesis. Remains requiring high Oxygen,and somewhat drowsy.  REVIEW OF SYSTEMS:  Review of Systems  Constitutional: Positive for malaise/fatigue. Negative for chills and fever.  HENT: Positive for congestion. Negative for ear discharge, hearing loss and nosebleeds.   Eyes: Negative for blurred vision and double vision.  Respiratory: Positive for cough and shortness of breath. Negative for wheezing.   Cardiovascular: Positive for orthopnea. Negative for chest pain and palpitations.  Gastrointestinal: Negative for abdominal pain, constipation, diarrhea, nausea and vomiting.  Genitourinary: Negative for dysuria.  Musculoskeletal: Negative for myalgias.  Neurological: Negative for dizziness, speech change, focal weakness, seizures and headaches.  Psychiatric/Behavioral: Negative for depression.    DRUG ALLERGIES:   Allergies  Allergen Reactions  . Tetracyclines & Related Other (See Comments)    Reaction: Unknown     VITALS:  Blood pressure (!) 131/42, pulse 68, temperature 98.7 F (37.1 C), temperature source Oral, resp. rate 13, height 5\' 8"  (1.727 m), weight 94 kg (207 lb 3.7 oz), SpO2 96 %.  PHYSICAL EXAMINATION:  Physical Exam  GENERAL:  76 y.o.-year-old patient lying in the bed, Appears short of breath with minimal exertion EYES: Pupils equal, round, reactive to light and accommodation. No scleral icterus. Extraocular muscles intact.  HEENT: Head atraumatic, normocephalic. Oropharynx and nasopharynx clear.  NECK:  Supple, no jugular venous distention. No thyroid enlargement, no tenderness.  LUNGS: Normal breath sounds bilaterally, no wheezing, rales,rhonchi or crepitation. No use of accessory muscles of respiration. Decreased  bibasilar breath sounds, chest tube in place, On Bipap CARDIOVASCULAR: S1, S2 normal. No  rubs, or gallops. 2/6 systolic murmur is present ABDOMEN: Soft, nontender, nondistended. Bowel sounds present. No organomegaly or mass.  EXTREMITIES: No pedal edema, cyanosis, or clubbing. Left arm AV fistula present NEUROLOGIC: Cranial nerves II through XII are intact. Muscle strength 3-4/5 in all extremities. Sensation intact. Gait not checked.  PSYCHIATRIC: The patient is drowsy, but oriented on arousal and oriented X2.  SKIN: No obvious rash, lesion, or ulcer.    LABORATORY PANEL:   CBC  Recent Labs Lab 01/07/17 0537  WBC 7.9  HGB 9.5*  HCT 28.3*  PLT 131*   ------------------------------------------------------------------------------------------------------------------  Chemistries   Recent Labs Lab 01/07/17 0537  NA 135  K 4.8  CL 98*  CO2 26  GLUCOSE 155*  BUN 42*  CREATININE 5.49*  CALCIUM 8.2*   ------------------------------------------------------------------------------------------------------------------  Cardiac Enzymes No results for input(s): TROPONINI in the last 168 hours. ------------------------------------------------------------------------------------------------------------------  RADIOLOGY:  Dg Chest 2 View  Result Date: 01/06/2017 CLINICAL DATA:  Pneumonia, prostate cancer. EXAM: CHEST  2 VIEW COMPARISON:  01/02/2017 and PET 10/22/2016.  CT chest 10/17/2016. FINDINGS: Trachea is midline. Heart is mildly enlarged. Thoracic aorta is calcified. Mild initial prominence with bibasilar airspace opacification, right greater than left. Small right pleural effusion. Tiny left pleural effusion. IMPRESSION: 1. Mild CHF. 2. Right lower lobe collapse/consolidation may be due to pneumonia. Overall aeration at the right lung base is slightly improved from 01/02/2017. 3. Left lower lobe atelectasis. 4.  Aortic atherosclerosis (ICD10-170.0). Electronically Signed   By:  Lorin Picket M.D.   On: 01/06/2017 07:43   Dg Chest Port 1 View  Result Date: 01/07/2017 CLINICAL DATA:  Respiratory failure. EXAM: PORTABLE CHEST  1 VIEW COMPARISON:  01/06/2017. FINDINGS: Tubing noted over the right chest with tip over the lower right chest as previously noted in unchanged position. This may represent a chest tube. Improvement of mediastinal prominence. Stable cardiomegaly. Progressive bilateral lower lobe atelectasis. Left base infiltrate may be present. Small left pleural effusion may be present. No pneumothorax. Degenerative changes thoracic spine. IMPRESSION: 1. Tubing noted over the right chest with tip over the lower right chest as previously noted is in unchanged position. This may represent a chest tube. No pneumothorax. 2.  Interim improvement of mediastinal prominence. 3. Progressive bilateral lower lobe atelectasis. Left base infiltrate cannot be excluded. Small left pleural effusion cannot be excluded. 4. Stable cardiomegaly. Electronically Signed   By: Marcello Moores  Register   On: 01/07/2017 06:17   Dg Chest Port 1 View  Result Date: 01/06/2017 CLINICAL DATA:  Postoperative radiograph. EXAM: PORTABLE CHEST 1 VIEW COMPARISON:  01/06/2017 FINDINGS: Right-sided catheter loops over the lower thorax, tip in uncertain location. Cardiomediastinal silhouette is stably enlarged. Mediastinal contour is enlarged, which may be exaggerated by portable AP technique. Calcific atherosclerotic disease of the aorta. Bilateral lower lobe airspace consolidation. Osseous structures are without acute abnormality. Soft tissues are grossly normal. IMPRESSION: Right-sided catheter with tip overlying right lower thorax/ upper abdomen, in uncertain location. Apparent enlargement of the mediastinum, query postsurgical. Please correlate clinically. Bilateral lower lobe airspace consolidation. Electronically Signed   By: Fidela Salisbury M.D.   On: 01/06/2017 16:17    EKG:   Orders placed or performed  during the hospital encounter of 12/30/16  . ED EKG within 10 minutes  . ED EKG within 10 minutes  . EKG 12-Lead  . EKG 12-Lead    ASSESSMENT AND PLAN:   76 year old male with past medical history significant for end-stage renal disease on hemodialysis, congestive heart failure, COPD on 2L home oxygen, diabetes and hypertension presents to hospital secondary to worsening dyspnea.  #1 paroxysmal atrial fibrillation with rapid ventricular response- -received amiodarone drip, rate is better controlled. -On oral amiodarone at this time and also carvedilol. -Was not on anticoagulation as outpatient due to GI bleed. Currently on heparin drip -Discussed with outpatient cardiologist about discharging on oral anticoagulation, especially due to GI bleed during last hospitalization. - After procedure- on [place] heparine, may resume anticoagulant once allowed by surgery.  #2 Ac on [place] respi failure with hypoxia due to pleural effusion  acute on chronic combined CHF, last EF of 50-55%.  -Appreciate cardiology consult. Continue oral Lasix at this time. Appears well compensated -History of recurrent right pleural effusion. Appreciate thoraco-surgical consult. - talc pleurodesis done- hold heparin  -Empirically on Levaquin for right lower lobe infiltrate. - Requiring Bipap post-procedure, Monitor in stepdown unit,   Lung still not expanded well, may need bronch.  #3diabetes mellitus-continue Levemir and sliding scale insulin. Also on aspart insulin prior to meals.  #4 end-stage renal disease on hemodialysis-appreciate nephrology input.  On Monday Wednesday Friday dialysis.   #5 COPD-stable at this time. On 3-6L home oxygen -Continue inhalers. Continue outpatient pulmonary follow-up - Due to respi failure post procedure, On Bipap- monitor in step down unit.  #6  CAD-with chronic stable angina. Status post cardiac catheterization in May 2018 with diffuse RCA disease and collaterals LAD, left  circumflex 75% stenosis. Troponins were elevated on admission. Cardiology recommended medical management. -Continue Imdur  #7 DVT prophylaxis-on heparin [place] this time    All the records are reviewed and case discussed with Care Management/Social Workerr. Management plans discussed  with the patient, family and they are in agreement.  CODE STATUS: Full code  TOTAL TIME TAKING CARE OF THIS PATIENT: 35 minutes.   POSSIBLE D/C IN 2 DAYS, DEPENDING ON CLINICAL CONDITION.   Vaughan Basta M.D on 01/08/2017 at 7:29 AM  Between 7am to 6pm - Pager - [phone]  After 6pm go to [email or web address] - password [email or web address] [facility]  Office  [phone]  CC: Primary care physician; [name], MD

## 2017-01-08 NOTE — Progress Notes (Signed)
Report called to [place], RN

## 2017-01-08 NOTE — Progress Notes (Signed)
Inpatient Diabetes Program Recommendations  AACE/ADA: New Consensus Statement on Inpatient Glycemic Control (2015)  Target Ranges:  Prepandial:   less than 140 mg/dL      Peak postprandial:   less than 180 mg/dL (1-2 hours)      Critically ill patients:  140 - 180 mg/dL   Results for Calvin Good, Calvin Good (MRN [id number]) as of 01/08/2017 14:01  Ref. Range 01/07/2017 11:25 01/07/2017 17:46 01/07/2017 22:30 01/08/2017 07:49 01/08/2017 11:50  Glucose-Capillary Latest Ref Range: 65 - 99 mg/dL 120 (H) 199 (H) 250 (H) 282 (H) 316 (H)   Review of Glycemic Control  Diabetes history:  Outpatient Diabetes medications: Levemir 30 units QHS, Humalog 10 units tid with meals Current orders for Inpatient glycemic control: Levemir 20 units QHS, Novolog Sensitive Correction 0-9 units tid + novolog HS scale 0-5 units + Novolog 3 units tid meal coverage  Inpatient Diabetes Program Recommendations:   Patient takes more Levemir at home. Consider increasing Levemir to 22-23 units.  Patient on more meal coverage at home. If steroids are continued, please consider increasing Novolog meal coverage to 6 units TID with meals.  Thanks,   Tama Headings RN, MSN, [facility] Inpatient Diabetes Coordinator Team Pager [phone] (8a-5p)

## 2017-01-08 NOTE — Progress Notes (Signed)
Calvin Good, [place] 01/08/17  Subjective:  The patient remains in the critical care unit. He is due for hemodialysis again today. Chest tube appears to be draining.  Objective:  Vital signs in last 24 hours:  Temp:  [98.2 F (36.8 C)-99.4 F (37.4 C)] 98.7 F (37.1 C) (10/10 0400) Pulse Rate:  [68-103] 68 (10/10 0600) Resp:  [11-26] 13 (10/10 0600) BP: (119-172)/(42-77) 131/42 (10/10 0600) SpO2:  [90 %-96 %] 96 % (10/10 0600) Weight:  [94 kg (207 lb 3.7 oz)-101.3 kg (223 lb 5.2 oz)] 94 kg (207 lb 3.7 oz) (10/10 0500)  Weight change: 1.509 kg (3 lb 5.2 oz) Filed Weights   01/07/17 1015 01/07/17 1258 01/08/17 0500  Weight: 101.3 kg (223 lb 5.2 oz) 100 kg (220 lb 7.4 oz) 94 kg (207 lb 3.7 oz)    Intake/Output:    Intake/Output Summary (Last 24 hours) at 01/08/17 0913 Last data filed at 01/08/17 0907  Gross per 24 hour  Intake              600 ml  Output             1620 ml  Net            -1020 ml     Physical Exam: General: Chronically ill-appearing  HEENT moist oral mucus membranes  Neck supple  Pulm/lungs Scattered rhonchi bilateral, normal effort, chest tube on left  CVS/Heart Irregular  Abdomen:  Soft, nontender, BS present  Extremities: + peripheral edema  Neurologic: Alert and oriented x 3 follows commands  Skin: No acute rashes  access Left arm AV fistula       Basic Metabolic Panel:    Recent Labs Lab 01/02/17 1142 01/03/17 0605 01/03/17 2341 01/06/17 0539 01/06/17 1830 01/07/17 0537 01/07/17 0927  NA  --  131* 130* 134* 133* 135  --   K  --  4.4 4.2 4.5 5.0 4.8  --   CL  --  93* 91* 96* 94* 98*  --   CO2  --  26 25 27 25 26   --   GLUCOSE  --  223* 212* 195* 185* 155*  --   BUN  --  62* 81* 53* 57* 42*  --   CREATININE  --  6.26* 7.26* 5.74* 6.61* 5.49*  --   CALCIUM  --  8.4* 8.2* 8.3* 8.6* 8.2*  --   PHOS 5.0*  --   --   --   --   --  5.6*     CBC:  Recent Labs Lab 01/04/17 0848 01/05/17 0543  01/06/17 0539 01/06/17 1830 01/07/17 0537  WBC 7.3 7.0 6.5 10.1 7.9  NEUTROABS  --   --   --   --  6.3  HGB 9.7* 9.1* 9.4* 10.6* 9.5*  HCT 28.4* 26.9* 26.9* 31.8* 28.3*  MCV 85.9 85.7 86.9 88.7 89.0  PLT 109* 105* 114* 137* 131*      Lab Results  Component Value Date   HEPBSAG Negative 11/27/2016      Microbiology:  Recent Results (from the past 240 hour(s))  Blood Culture (routine x 2)     Status: None   Collection Time: 12/30/16  4:28 PM  Result Value Ref Range Status   Specimen Description BLOOD RIGHT ANTECUBITAL  Final   Special Requests   Final    BOTTLES DRAWN AEROBIC AND ANAEROBIC Blood Culture adequate volume   Culture NO GROWTH 5 DAYS  Final   Report Status  01/04/2017 FINAL  Final  Blood Culture (routine x 2)     Status: None   Collection Time: 12/30/16  4:33 PM  Result Value Ref Range Status   Specimen Description BLOOD BLOOD RIGHT HAND  Final   Special Requests   Final    BOTTLES DRAWN AEROBIC AND ANAEROBIC Blood Culture adequate volume   Culture NO GROWTH 5 DAYS  Final   Report Status 01/04/2017 FINAL  Final  MRSA PCR Screening     Status: None   Collection Time: 12/31/16 12:50 AM  Result Value Ref Range Status   MRSA by PCR NEGATIVE NEGATIVE Final    Comment:        The GeneXpert MRSA Assay (FDA approved for NASAL specimens only), is one component of a comprehensive MRSA colonization surveillance program. It is not intended to diagnose MRSA infection nor to guide or monitor treatment for MRSA infections.     Coagulation Studies: No results for input(s): LABPROT, INR in the last 72 hours.  Urinalysis: No results for input(s): COLORURINE, LABSPEC, PHURINE, GLUCOSEU, HGBUR, BILIRUBINUR, KETONESUR, PROTEINUR, UROBILINOGEN, NITRITE, LEUKOCYTESUR in the last 72 hours.  Invalid input(s): APPERANCEUR    Imaging: Dg Chest Port 1 View  Result Date: 01/07/2017 CLINICAL DATA:  Respiratory failure. EXAM: PORTABLE CHEST 1 VIEW COMPARISON:   01/06/2017. FINDINGS: Tubing noted over the right chest with tip over the lower right chest as previously noted in unchanged position. This may represent a chest tube. Improvement of mediastinal prominence. Stable cardiomegaly. Progressive bilateral lower lobe atelectasis. Left base infiltrate may be present. Small left pleural effusion may be present. No pneumothorax. Degenerative changes thoracic spine. IMPRESSION: 1. Tubing noted over the right chest with tip over the lower right chest as previously noted is in unchanged position. This may represent a chest tube. No pneumothorax. 2.  Interim improvement of mediastinal prominence. 3. Progressive bilateral lower lobe atelectasis. Left base infiltrate cannot be excluded. Small left pleural effusion cannot be excluded. 4. Stable cardiomegaly. Electronically Signed   By: Marcello Moores  Register   On: 01/07/2017 06:17   Dg Chest Port 1 View  Result Date: 01/06/2017 CLINICAL DATA:  Postoperative radiograph. EXAM: PORTABLE CHEST 1 VIEW COMPARISON:  01/06/2017 FINDINGS: Right-sided catheter loops over the lower thorax, tip in uncertain location. Cardiomediastinal silhouette is stably enlarged. Mediastinal contour is enlarged, which may be exaggerated by portable AP technique. Calcific atherosclerotic disease of the aorta. Bilateral lower lobe airspace consolidation. Osseous structures are without acute abnormality. Soft tissues are grossly normal. IMPRESSION: Right-sided catheter with tip overlying right lower thorax/ upper abdomen, in uncertain location. Apparent enlargement of the mediastinum, query postsurgical. Please correlate clinically. Bilateral lower lobe airspace consolidation. Electronically Signed   By: Fidela Salisbury M.D.   On: 01/06/2017 16:17     Medications:   . sodium chloride Stopped (01/06/17 2100)  . sodium chloride    . sodium chloride    . ceFEPime (MAXIPIME) IV Stopped (01/07/17 2130)   . amiodarone  400 mg Oral Daily  . aspirin EC  81  mg Oral Daily  . atorvastatin  20 mg Oral QHS  . budesonide (PULMICORT) nebulizer solution  0.5 mg Nebulization BID  . calcium acetate  667 mg Oral TID WC  . carvedilol  6.25 mg Oral BID  . chlorpheniramine-HYDROcodone  5 mL Oral Q12H  . doxazosin  8 mg Oral QPM  . epoetin (EPOGEN/PROCRIT) injection  4,000 Units Intravenous Q M,W,F-HD  . feeding supplement (NEPRO CARB STEADY)  237 mL Oral BID BM  .  fluticasone  1 spray Each Nare Daily  . furosemide  40 mg Oral BID  . guaiFENesin  600 mg Oral BID  . heparin subcutaneous  5,000 Units Subcutaneous Q8H  . insulin aspart  0-5 Units Subcutaneous QHS  . insulin aspart  0-9 Units Subcutaneous TID WC  . insulin aspart  3 Units Subcutaneous TID WC  . insulin detemir  20 Units Subcutaneous QHS  . ipratropium-albuterol  3 mL Nebulization Q4H  . isosorbide mononitrate  60 mg Oral Daily  . lisinopril  20 mg Oral QPM  . methylPREDNISolone (SOLU-MEDROL) injection  20 mg Intravenous BID  . pantoprazole  40 mg Oral Daily  . sodium chloride flush  3 mL Intravenous Q12H  . spironolactone  25 mg Oral BID   sodium chloride, sodium chloride, sodium chloride, acetaminophen **OR** acetaminophen, alteplase, benzonatate, fentaNYL (SUBLIMAZE) injection, heparin, HYDROcodone-acetaminophen, ipratropium-albuterol, lidocaine (PF), lidocaine-prilocaine, lidocaine-prilocaine, morphine injection, ondansetron (ZOFRAN) IV, pentafluoroprop-tetrafluoroeth, phenylephrine, senna-docusate, sodium chloride, sodium chloride flush, traMADol  Assessment/ Plan:  76 y.o. male with diabetes mellitus type II insulin dependent, hypertension, coronary artery disease, hyperlipidemia, prostate cancer status post prostatectomy, right total knee, appendectomy.   MWF [facility] [place] AVF  1. End Stage Renal Disease MWF:  - patient due for hemodialysis today. Orders have been prepared. Continue dialysis on a Monday, Wednesday, Friday schedule.  2. Anemia of chronic kidney  disease. - hemoglobin relatively stable. Continue Epogen with dialysis.   3. 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  - Management as per hospitalist.    4. Secondary Hyperparathyroidism:  - phosphorus slightly high at 5.6. Continue current dosage of calcium acetate.  5. A Fib with RVR:  Continue amiodarone 400 mg by mouth daily.  6. Acute respiratory failure. Patient appears to be having multiple respiratory issues. Recently he's had pulmonary edema, pleural effusion, as well as pneumonia possibly. Management as per pulmonary/critical care. We have been performing ultrafiltration with dialysis.   LOS: [place] 10/10/20189:13 [place], [place]

## 2017-01-08 NOTE — Progress Notes (Signed)
[facility] [place] NAME: Calvin Good    MR#:  [id number]  DATE OF BIRTH:  08-29-1940  SUBJECTIVE:  CHIEF COMPLAINT:   Chief Complaint  Patient presents with  . Chest Pain  - s/p VATs and pleurodesis. Remains requiring Oxygen,and have drainage from chest tube.  REVIEW OF SYSTEMS:  Review of Systems  Constitutional: Positive for malaise/fatigue. Negative for chills and fever.  HENT: Positive for congestion. Negative for ear discharge, hearing loss and nosebleeds.   Eyes: Negative for blurred vision and double vision.  Respiratory: Positive for cough and shortness of breath. Negative for wheezing.   Cardiovascular: Positive for orthopnea. Negative for chest pain and palpitations.  Gastrointestinal: Negative for abdominal pain, constipation, diarrhea, nausea and vomiting.  Genitourinary: Negative for dysuria.  Musculoskeletal: Negative for myalgias.  Neurological: Negative for dizziness, speech change, focal weakness, seizures and headaches.  Psychiatric/Behavioral: Negative for depression.    DRUG ALLERGIES:   Allergies  Allergen Reactions  . Tetracyclines & Related Other (See Comments)    Reaction: Unknown     VITALS:  Blood pressure (!) 137/44, pulse 73, temperature 99 F (37.2 C), temperature source Oral, resp. rate (!) 25, height 5\' 8"  (1.727 m), weight 94 kg (207 lb 3.7 oz), SpO2 95 %.  PHYSICAL EXAMINATION:  Physical Exam  GENERAL:  76 y.o.-year-old patient lying in the bed, Appears short of breath with minimal exertion EYES: Pupils equal, round, reactive to light and accommodation. No scleral icterus. Extraocular muscles intact.  HEENT: Head atraumatic, normocephalic. Oropharynx and nasopharynx clear.  NECK:  Supple, no jugular venous distention. No thyroid enlargement, no tenderness.  LUNGS: Normal breath sounds bilaterally, no wheezing, have some crepitation. No use of accessory muscles of respiration. Decreased  bibasilar breath sounds, chest tube in place, On nasal canula oxygen today. CARDIOVASCULAR: S1, S2 normal. No  rubs, or gallops. 2/6 systolic murmur is present ABDOMEN: Soft, nontender, nondistended. Bowel sounds present. No organomegaly or mass.  EXTREMITIES: No pedal edema, cyanosis, or clubbing. Left arm AV fistula present NEUROLOGIC: Cranial nerves II through XII are intact. Muscle strength 3-4/5 in all extremities. Sensation intact. Gait not checked.  PSYCHIATRIC: The patient is alert,  oriented X3.  SKIN: No obvious rash, lesion, or ulcer.    LABORATORY PANEL:   CBC  Recent Labs Lab 01/07/17 0537  WBC 7.9  HGB 9.5*  HCT 28.3*  PLT 131*   ------------------------------------------------------------------------------------------------------------------  Chemistries   Recent Labs Lab 01/08/17 1033  NA 132*  K 4.2  CL 92*  CO2 27  GLUCOSE 297*  BUN 48*  CREATININE 5.41*  CALCIUM 8.2*   ------------------------------------------------------------------------------------------------------------------  Cardiac Enzymes No results for input(s): TROPONINI in the last 168 hours. ------------------------------------------------------------------------------------------------------------------  RADIOLOGY:  Dg Chest Port 1 View  Result Date: 01/07/2017 CLINICAL DATA:  Respiratory failure. EXAM: PORTABLE CHEST 1 VIEW COMPARISON:  01/06/2017. FINDINGS: Tubing noted over the right chest with tip over the lower right chest as previously noted in unchanged position. This may represent a chest tube. Improvement of mediastinal prominence. Stable cardiomegaly. Progressive bilateral lower lobe atelectasis. Left base infiltrate may be present. Small left pleural effusion may be present. No pneumothorax. Degenerative changes thoracic spine. IMPRESSION: 1. Tubing noted over the right chest with tip over the lower right chest as previously noted is in unchanged position. This may represent a  chest tube. No pneumothorax. 2.  Interim improvement of mediastinal prominence. 3. Progressive bilateral lower lobe atelectasis. Left base infiltrate cannot be excluded.  Small left pleural effusion cannot be excluded. 4. Stable cardiomegaly. Electronically Signed   By: Marcello Moores  Register   On: 01/07/2017 06:17    EKG:   Orders placed or performed during the hospital encounter of 12/30/16  . ED EKG within 10 minutes  . ED EKG within 10 minutes  . EKG 12-Lead  . EKG 12-Lead    ASSESSMENT AND PLAN:   76 year old male with past medical history significant for end-stage renal disease on hemodialysis, congestive heart failure, COPD on 2L home oxygen, diabetes and hypertension presents to hospital secondary to worsening dyspnea.  #1 paroxysmal atrial fibrillation with rapid ventricular response- -received amiodarone drip, rate is better controlled. -On oral amiodarone at this time and also carvedilol. -Was not on anticoagulation as outpatient due to GI bleed. Currently on heparin drip -Discussed with outpatient cardiologist about discharging on oral anticoagulation, especially due to GI bleed during last hospitalization. - After procedure- on [place] heparine, may resume anticoagulant once allowed by surgery.  #2 Ac on [place] respi failure with hypoxia due to pleural effusion  acute on chronic combined CHF, last EF of 50-55%.  -Appreciate cardiology consult. Continue oral Lasix at this time. Appears well compensated -History of recurrent right pleural effusion. Appreciate thoraco-surgical consult. - talc pleurodesis done- hold heparin  -Empirically on Levaquin for right lower lobe infiltrate. - Requiring Bipap post-procedure, Monitor in stepdown unit,   #3diabetes mellitus-continue Levemir and sliding scale insulin. Also on aspart insulin prior to meals.  #4 end-stage renal disease on hemodialysis-appreciate nephrology input.  On Monday Wednesday Friday dialysis.   #5 COPD-stable at this  time. On 3-6L home oxygen -Continue inhalers. Continue outpatient pulmonary follow-up - Due to respi failure post procedure, On Bipap- monitored in step down unit. - Improved now, transfer to floor.  #6  CAD-with chronic stable angina. Status post cardiac catheterization in May 2018 with diffuse RCA disease and collaterals LAD, left circumflex 75% stenosis. Troponins were elevated on admission. Cardiology recommended medical management. -Continue Imdur  #7 DVT prophylaxis-on heparin [place] this time    All the records are reviewed and case discussed with Care Management/Social Workerr. Management plans discussed with the patient, family and they are in agreement.  CODE STATUS: Full code  TOTAL TIME TAKING CARE OF THIS PATIENT: 35 minutes.   POSSIBLE D/C IN 2 DAYS, DEPENDING ON CLINICAL CONDITION.   Vaughan Basta M.D on 01/08/2017 at 6:53 PM  Between 7am to 6pm - Pager - [phone]  After 6pm go to [email or web address] - password [email or web address] [facility]  Office  [phone]  CC: Primary care physician; [name], MD

## 2017-01-08 NOTE — Progress Notes (Signed)
Nutrition Follow-up  DOCUMENTATION CODES:   Obesity unspecified  INTERVENTION:  Continue Nepro Shake po BID, each supplement provides 425 kcal and 19 grams protein.  Recommend renal-appropriate multivitamin with minerals (Rena-vite) QHS.   Encouraged intake of adequate calories and protein from meals.  NUTRITION DIAGNOSIS:   Inadequate oral intake related to poor appetite, acute illness as evidenced by per patient/family report, percent weight loss.  Ongoing - addressing with Nepro.   GOAL:   Patient will meet greater than or equal to 90% of their needs  Not met as patient was NPO.   MONITOR:   PO intake, Labs, I & O's, Weight trends, Supplement acceptance  REASON FOR ASSESSMENT:   Malnutrition Screening Tool    ASSESSMENT:   76 year old male with chronic atrial fibrillation, chronic systolic and diastolic heart failure with most recent EF showing 55% and end-stage renal disease on hemodialysis who presents with chest pain.   -On 10/8 patient underwent bronchoscopy with right VATS and talc pleurodesis for recurrent right-sided pleural effusion. Patient now with right chest tube.  Met with patient at bedside. His diet was advanced to carbohydrate modified today. He reports he did well with his breakfast and his appetite is good. He denies any N/V, abdominal pain, or difficulty chewing/swallowing. He reports he is having constipation. Per chart last BM was on 10/7. He enjoys Nepro and was drinking BID before being made NPO.  Meal Completion: 85% of breakfast this morning; prior to being made NPO on 10/8, pt was eating 85-100% of all meals  Medications reviewed and include: amiodarone, Epogen 4000 units every M/W/F with HD, Lasix 40 mg BID, Novolog 0-9 units TID, Novolog 0-5 units QHS, Novolog 3 units TID, Levemir 20 units QHS, pantoprazole, cefepime.  Labs reviewed: CBG 120-316 past 24 hrs, Sodium 132, Chloride 92, BUN 48, Creatinine 5.41.  Weight trend: 94 kg on 10/10;  weight trending back down; now stable from 94.3 kg on admission  I/O: 120 ml output from chest tube yesterday  Discussed with RN.  Diet Order:  Diet Carb Modified Fluid consistency: Thin; Room service appropriate? Yes  Skin:  Wound (see comment) (closed incision right chest)  Last BM:  01/05/2017  Height:   Ht Readings from Last 1 Encounters:  01/06/17 _0  (1.727 m)    Weight:   Wt Readings from Last 1 Encounters:  01/08/17 207 lb 3.7 oz (94 kg)    Ideal Body Weight:  70 kg  BMI:  Body mass index is 31.51 kg/m.  Estimated Nutritional Needs:   Kcal:  1970-2300 (MSJ x 1.2-1.4)  Protein:  108-135 grams  Fluid:  UOP +1L  EDUCATION NEEDS:   No education needs identified at this time  Willey Blade, [place], [place], [place] Office: [phone] Pager: [phone] After Hours/Weekend Pager: [phone]

## 2017-01-08 NOTE — Progress Notes (Signed)
Pharmacy Antibiotic Note  Calvin Good is a 76 y.o. male admitted on 12/30/2016 with respiratory failure. Pharmacy has been consulted for cefepime dosing to treat HCAP.  Plan: Continue cefepime 2 g IV Q 24 hours.    Height: 5\' 8"  (172.7 cm) Weight: 207 lb 3.7 oz (94 kg) IBW/kg (Calculated) : 68.4  Temp (24hrs), Avg:98.3 F (36.8 C), Min:97.8 F (36.6 C), Max:99 F (37.2 C)   Recent Labs Lab 01/03/17 2341 01/04/17 0848 01/05/17 0543 01/06/17 0539 01/06/17 1830 01/07/17 0537 01/08/17 1033  WBC 7.9 7.3 7.0 6.5 10.1 7.9  --   CREATININE 7.26*  --   --  5.74* 6.61* 5.49* 5.41*    Estimated Creatinine Clearance: 12.5 mL/min (A) (by C-G formula based on SCr of 5.41 mg/dL (H)).    Allergies  Allergen Reactions  . Tetracyclines & Related Other (See Comments)    Reaction: Unknown     Antimicrobials this admission: Vancomycin 10/8 >> 10/8 Cefuroxime 10/8 >> 10/9 (surgical prophylaxis) Levofloxacin 10/6 >> 10/9 Cefepime 10/9 >>  Dose adjustments this admission: 10/9 Discontinued levofloxacin due to amiodarone drug interaction causing increased risk of QTc prolongation.  Microbiology results: 10/6 BCx: no growth 5 days 10/2 MRSA PCR: negative  Thank you for allowing pharmacy to be a part of this patient's care.  Calvin Good,Calvin Good 01/08/2017 8:31 PM

## 2017-01-08 NOTE — Procedures (Signed)
Arrived to ICU-6 at or about 1400 and assessed pt prior to tx and found to have a LUA AVF, which was prepared per p&p and cannulated w 15 ga needles w/o issue; order includes RTD 3.5 hours on 3251 bath, BFR 400, DFR 800 and attempt to achieve 1.5 kg fluid removal as tolerated by pt; pt initiated at 1500 w/o issue; pt completed his entire tx and achieved target of 2 kg fluid removal w/o complication, all blood was rinsed back and needles aseptically de-cannulated and pressure dressings applied; hemostasis achieved w/o prolonged bleeding and pt was stable at the end of his tx; report given to primary RN; pt had family visiting during his tx and tolerated tx well

## 2017-01-09 LAB — GLUCOSE, CAPILLARY
GLUCOSE-CAPILLARY: 297 mg/dL — AB (ref 65–99)
GLUCOSE-CAPILLARY: 337 mg/dL — AB (ref 65–99)
Glucose-Capillary: 358 mg/dL — ABNORMAL HIGH (ref 65–99)
Glucose-Capillary: 323 mg/dL — ABNORMAL HIGH (ref 65–99)

## 2017-01-09 LAB — PHOSPHORUS: Phosphorus: 6.1 mg/dL — ABNORMAL HIGH (ref 2.5–4.6)

## 2017-01-09 MED ORDER — DEXTROSE 5 % IV SOLN
1.0000 g | INTRAVENOUS | Status: DC
  Administered 2017-01-09 – 2017-01-10 (×2): 1 g via INTRAVENOUS
  Filled 2017-01-09 (×3): qty 1

## 2017-01-09 MED ORDER — INSULIN DETEMIR 100 UNIT/ML ~~LOC~~ SOLN
24.0000 [IU] | Freq: Every day | SUBCUTANEOUS | Status: DC
  Administered 2017-01-09: 24 [IU] via SUBCUTANEOUS
  Filled 2017-01-09 (×2): qty 0.24

## 2017-01-09 MED ORDER — INSULIN ASPART 100 UNIT/ML ~~LOC~~ SOLN
6.0000 [IU] | Freq: Three times a day (TID) | SUBCUTANEOUS | Status: DC
  Administered 2017-01-09 – 2017-01-16 (×14): 6 [IU] via SUBCUTANEOUS
  Filled 2017-01-09 (×14): qty 1

## 2017-01-09 NOTE — Progress Notes (Signed)
[place] [facility] [place] NAME: Sartaj Hoskin    MR#:  [id number]  DATE OF BIRTH:  02-27-1941  SUBJECTIVE:  CHIEF COMPLAINT:   Chief Complaint  Patient presents with  . Chest Pain  - s/p VATs and pleurodesis. Remains requiring Oxygen,and have drainage from chest tube. Comfortable today on 5 ltr oxygen.  REVIEW OF SYSTEMS:  Review of Systems  Constitutional: Positive for malaise/fatigue. Negative for chills and fever.  HENT: Positive for congestion. Negative for ear discharge, hearing loss and nosebleeds.   Eyes: Negative for blurred vision and double vision.  Respiratory: Positive for cough and shortness of breath. Negative for wheezing.   Cardiovascular: Positive for orthopnea. Negative for chest pain and palpitations.  Gastrointestinal: Negative for abdominal pain, constipation, diarrhea, nausea and vomiting.  Genitourinary: Negative for dysuria.  Musculoskeletal: Negative for myalgias.  Neurological: Negative for dizziness, speech change, focal weakness, seizures and headaches.  Psychiatric/Behavioral: Negative for depression.    DRUG ALLERGIES:   Allergies  Allergen Reactions  . Tetracyclines & Related Other (See Comments)    Reaction: Unknown     VITALS:  Blood pressure (!) 140/52, pulse 74, temperature (!) 97.3 F (36.3 C), temperature source Oral, resp. rate 18, height 5\' 8"  (1.727 m), weight 100.3 kg (221 lb 3.2 oz), SpO2 (!) 84 %.  PHYSICAL EXAMINATION:  Physical Exam  GENERAL:  76 y.o.-year-old patient lying in the bed, Appears short of breath with minimal exertion EYES: Pupils equal, round, reactive to light and accommodation. No scleral icterus. Extraocular muscles intact.  HEENT: Head atraumatic, normocephalic. Oropharynx and nasopharynx clear.  NECK:  Supple, no jugular venous distention. No thyroid enlargement, no tenderness.  LUNGS: Normal breath sounds bilaterally, no wheezing, have some crepitation. No use of  accessory muscles of respiration. Decreased bibasilar breath sounds, chest tube in place, On nasal canula oxygen today. CARDIOVASCULAR: S1, S2 normal. No  rubs, or gallops. 2/6 systolic murmur is present ABDOMEN: Soft, nontender, nondistended. Bowel sounds present. No organomegaly or mass.  EXTREMITIES: No pedal edema, cyanosis, or clubbing. Left arm AV fistula present NEUROLOGIC: Cranial nerves II through XII are intact. Muscle strength 3-4/5 in all extremities. Sensation intact. Gait not checked.  PSYCHIATRIC: The patient is alert,  oriented X3.  SKIN: No obvious rash, lesion, or ulcer.    LABORATORY PANEL:   CBC  Recent Labs Lab 01/07/17 0537  WBC 7.9  HGB 9.5*  HCT 28.3*  PLT 131*   ------------------------------------------------------------------------------------------------------------------  Chemistries   Recent Labs Lab 01/08/17 1033  NA 132*  K 4.2  CL 92*  CO2 27  GLUCOSE 297*  BUN 48*  CREATININE 5.41*  CALCIUM 8.2*   ------------------------------------------------------------------------------------------------------------------  Cardiac Enzymes No results for input(s): TROPONINI in the last 168 hours. ------------------------------------------------------------------------------------------------------------------  RADIOLOGY:  No results found.  EKG:   Orders placed or performed during the hospital encounter of 12/30/16  . ED EKG within 10 minutes  . ED EKG within 10 minutes  . EKG 12-Lead  . EKG 12-Lead    ASSESSMENT AND PLAN:   76 year old male with past medical history significant for end-stage renal disease on hemodialysis, congestive heart failure, COPD on 2L home oxygen, diabetes and hypertension presents to hospital secondary to worsening dyspnea.  #1 paroxysmal atrial fibrillation with rapid ventricular response- -received amiodarone drip, rate is better controlled. -On oral amiodarone at this time and also carvedilol. -Was not  on anticoagulation as outpatient due to GI bleed.  -Discussed with outpatient cardiologist about discharging  on oral anticoagulation, especially due to GI bleed during last hospitalization. - After procedure- on [place] heparine, may resume anticoagulant once allowed by surgery.  #2 Ac on [place] respi failure with hypoxia due to pleural effusion  acute on chronic combined CHF, last EF of 50-55%.  -Appreciate cardiology consult. Continue oral Lasix at this time. Appears well compensated -History of recurrent right pleural effusion. Appreciate thoraco-surgical consult. - talc pleurodesis done- hold heparin  -Empirically on Levaquin for right lower lobe infiltrate. - Requiring Bipap post-procedure, Monitor in stepdown unit,   #3diabetes mellitus-continue Levemir and sliding scale insulin. Also on aspart insulin prior to meals.  #4 end-stage renal disease on hemodialysis-appreciate nephrology input.  On Monday Wednesday Friday dialysis.   #5 COPD-stable at this time. On 3-6L home oxygen -Continue inhalers. Continue outpatient pulmonary follow-up - Due to respi failure post procedure, On Bipap- monitored in step down unit. - Improved now, transfer to floor.  #6  CAD-with chronic stable angina. Status post cardiac catheterization in May 2018 with diffuse RCA disease and collaterals LAD, left circumflex 75% stenosis. Troponins were elevated on admission. Cardiology recommended medical management. -Continue Imdur  #7 DVT prophylaxis-on heparin [place] this time  Discussed with pt about discharge planning and code status.   He said, his daughter is POA and She have papers.  All the records are reviewed and case discussed with Care Management/Social Workerr. Management plans discussed with the patient, family and they are in agreement.  CODE STATUS: Full code  TOTAL TIME TAKING CARE OF THIS PATIENT: 35 minutes.   POSSIBLE D/C IN 2 DAYS, DEPENDING ON CLINICAL CONDITION.   Vaughan Basta M.D on 01/09/2017 at 6:10 PM  Between 7am to 6pm - Pager - [phone]  After 6pm go to [email or web address] - password [email or web address] [facility]  Office  [phone]  CC: Primary care physician; [name], MD

## 2017-01-09 NOTE — Progress Notes (Signed)
He remains somewhat short of breath. His oxygen saturations are in the low 90s. A drain 700 cc from his chest tube. It is all serous in nature. There is no air leak. He did not have a chest x-ray to review today.  Despite undergoing talc pleurodesis he continues to have large output from his chest drain. The patient was very hesitant to have a Pleurx catheter placed in the past. He does not wish to be burdened with the responsibilities of managing that. Not sure what other options will be available to him if the pleurodesis does not work. I would recommend that we follow his chest tube output for the next day or 2 and if it does not go below 200 cc that we consider pleurodesis in him through the existing chest tube again. I will make that assessment on Monday morning.

## 2017-01-09 NOTE — Progress Notes (Signed)
[place], [place] 01/09/17  Subjective:  Patient seen at bedside. He had hemodialysis yesterday. Overall remains quite weak. Still short of breath as well. Continues to have significant chest tube output.  Objective:  Vital signs in last 24 hours:  Temp:  [97.3 F (36.3 C)-98 F (36.7 C)] 97.3 F (36.3 C) (10/11 1407) Pulse Rate:  [69-78] 74 (10/11 1407) Resp:  [10-25] 18 (10/11 0449) BP: (115-150)/(38-95) 150/41 (10/11 1407) SpO2:  [89 %-99 %] 99 % (10/11 1407) Weight:  [100.3 kg (221 lb 3.2 oz)] 100.3 kg (221 lb 3.2 oz) (10/11 0157)  Weight change: -0.964 kg (-2 lb 2 oz) Filed Weights   01/07/17 1258 01/08/17 0500 01/09/17 0157  Weight: 100 kg (220 lb 7.4 oz) 94 kg (207 lb 3.7 oz) 100.3 kg (221 lb 3.2 oz)    Intake/Output:    Intake/Output Summary (Last 24 hours) at 01/09/17 1529 Last data filed at 01/09/17 1500  Gross per 24 hour  Intake              530 ml  Output             3650 ml  Net            -3120 ml     Physical Exam: General: Chronically ill-appearing  HEENT moist oral mucus membranes  Neck supple  Pulm/lungs Scattered rhonchi bilateral, normal effort, chest tube on left  CVS/Heart Irregular  Abdomen:  Soft, nontender, BS present  Extremities: + peripheral edema  Neurologic: Alert and oriented x 3 follows commands  Skin: No acute rashes  access Left arm AV fistula       Basic Metabolic Panel:    Recent Labs Lab 01/03/17 2341 01/06/17 0539 01/06/17 1830 01/07/17 0537 01/07/17 0927 01/08/17 1033  NA 130* 134* 133* 135  --  132*  K 4.2 4.5 5.0 4.8  --  4.2  CL 91* 96* 94* 98*  --  92*  CO2 25 27 25 26   --  27  GLUCOSE 212* 195* 185* 155*  --  297*  BUN 81* 53* 57* 42*  --  48*  CREATININE 7.26* 5.74* 6.61* 5.49*  --  5.41*  CALCIUM 8.2* 8.3* 8.6* 8.2*  --  8.2*  PHOS  --   --   --   --  5.6*  --      CBC:  Recent Labs Lab 01/04/17 0848 01/05/17 0543 01/06/17 0539 01/06/17 1830 01/07/17 0537   WBC 7.3 7.0 6.5 10.1 7.9  NEUTROABS  --   --   --   --  6.3  HGB 9.7* 9.1* 9.4* 10.6* 9.5*  HCT 28.4* 26.9* 26.9* 31.8* 28.3*  MCV 85.9 85.7 86.9 88.7 89.0  PLT 109* 105* 114* 137* 131*      Lab Results  Component Value Date   HEPBSAG Negative 11/27/2016      Microbiology:  Recent Results (from the past 240 hour(s))  Blood Culture (routine x 2)     Status: None   Collection Time: 12/30/16  4:28 PM  Result Value Ref Range Status   Specimen Description BLOOD RIGHT ANTECUBITAL  Final   Special Requests   Final    BOTTLES DRAWN AEROBIC AND ANAEROBIC Blood Culture adequate volume   Culture NO GROWTH 5 DAYS  Final   Report Status 01/04/2017 FINAL  Final  Blood Culture (routine x 2)     Status: None   Collection Time: 12/30/16  4:33 PM  Result Value Ref  Range Status   Specimen Description BLOOD BLOOD RIGHT HAND  Final   Special Requests   Final    BOTTLES DRAWN AEROBIC AND ANAEROBIC Blood Culture adequate volume   Culture NO GROWTH 5 DAYS  Final   Report Status 01/04/2017 FINAL  Final  MRSA PCR Screening     Status: None   Collection Time: 12/31/16 12:50 AM  Result Value Ref Range Status   MRSA by PCR NEGATIVE NEGATIVE Final    Comment:        The GeneXpert MRSA Assay (FDA approved for NASAL specimens only), is one component of a comprehensive MRSA colonization surveillance program. It is not intended to diagnose MRSA infection nor to guide or monitor treatment for MRSA infections.     Coagulation Studies: No results for input(s): LABPROT, INR in the last 72 hours.  Urinalysis: No results for input(s): COLORURINE, LABSPEC, PHURINE, GLUCOSEU, HGBUR, BILIRUBINUR, KETONESUR, PROTEINUR, UROBILINOGEN, NITRITE, LEUKOCYTESUR in the last 72 hours.  Invalid input(s): APPERANCEUR    Imaging: No results found.   Medications:   . sodium chloride Stopped (01/06/17 2100)  . sodium chloride    . sodium chloride    . ceFEPime (MAXIPIME) IV     . amiodarone  400 mg  Oral Daily  . aspirin EC  81 mg Oral Daily  . atorvastatin  20 mg Oral QHS  . budesonide (PULMICORT) nebulizer solution  0.5 mg Nebulization BID  . calcium acetate  667 mg Oral TID WC  . carvedilol  6.25 mg Oral BID  . chlorpheniramine-HYDROcodone  5 mL Oral Q12H  . doxazosin  8 mg Oral QPM  . epoetin (EPOGEN/PROCRIT) injection  4,000 Units Intravenous Q M,W,F-HD  . feeding supplement (NEPRO CARB STEADY)  237 mL Oral BID BM  . fluticasone  1 spray Each Nare Daily  . furosemide  40 mg Oral BID  . guaiFENesin  600 mg Oral BID  . heparin subcutaneous  5,000 Units Subcutaneous Q8H  . insulin aspart  0-5 Units Subcutaneous QHS  . insulin aspart  0-9 Units Subcutaneous TID WC  . insulin aspart  6 Units Subcutaneous TID WC  . insulin detemir  24 Units Subcutaneous QHS  . ipratropium-albuterol  3 mL Nebulization Q6H  . isosorbide mononitrate  60 mg Oral Daily  . lisinopril  20 mg Oral QPM  . methylPREDNISolone (SOLU-MEDROL) injection  20 mg Intravenous BID  . multivitamin  1 tablet Oral QHS  . pantoprazole  40 mg Oral Daily  . sodium chloride flush  3 mL Intravenous Q12H  . spironolactone  25 mg Oral BID   sodium chloride, sodium chloride, sodium chloride, acetaminophen **OR** acetaminophen, alteplase, benzonatate, fentaNYL (SUBLIMAZE) injection, heparin, HYDROcodone-acetaminophen, ipratropium-albuterol, lidocaine (PF), lidocaine-prilocaine, lidocaine-prilocaine, morphine injection, ondansetron (ZOFRAN) IV, pentafluoroprop-tetrafluoroeth, phenylephrine, senna-docusate, sodium chloride, sodium chloride flush, traMADol  Assessment/ Plan:  76 y.o. male with diabetes mellitus type II insulin dependent, hypertension, coronary artery disease, hyperlipidemia, prostate cancer status post prostatectomy, right total knee, appendectomy.   MWF [facility] [place] [facility]  1. End Stage Renal Disease MWF:  - overall the patient appearsquite ill still.  We will plan for hemodialysis tomorrow.  2.  Anemia of chronic kidney disease. - plan to maintain the patient on Epogen with dialysis.  3. 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  - Management as per hospitalist.    4. Secondary Hyperparathyroidism:  - continue calcium acetate and followup serum phosphorus tomorrow.  5. A Fib with RVR:  Continue amiodarone 400  mg by mouth daily.  6. Acute respiratory failure. Patient continues to have significant shortness of breath despite good drainage from the chest tube.  Dr. Soyla Dryer considering repeat pleurodesis.    LOS: [place] 10/11/20183:29 PM  [facility] [place], [place]

## 2017-01-09 NOTE — Evaluation (Signed)
Physical Therapy Evaluation Patient Details Name: Calvin Good MRN: [id number] DOB: 10/19/1940 Today's Date: 01/09/2017   History of Present Illness  presented to ER from dialysis secondary to chest pain, elevated HR (130-150s); admitted with afib with RVR, ultimately requiring amioderone drip for control and conversion to NSR.  Now, status post bronchoscopy with R thoracoscopy, talc pleurodesis (10/8).  Currently on 5L supplemental O2 via [place]  Clinical Impression  Upon evaluation, patient alert and oriented; follows simple commands and demonstrates good effort with interventions.  Visibly fatigued and somewhat down affect compared to initial evaluation.  Bilat UE/LEs generally weak (and guarded) due to prolonged hospitalization and recent procedure.  As a result, requiring increased physical assist from therapist for functional activities compared to initial evaluation-mod assist for sit/stand without assist device; improved to min assist with use of RW (for sit/stand, basic transfers and short-distance gait).  Fatigues very quickly; unable to tolerate activity outside of room environment. Will continue to progress as medically appropriate and able to tolerate. Of note, patient with desaturation to 84% on 5L with minimal exertion, requiring 60-75 seconds of seated rest and pursed lip breathing for recovery to >90%.  RN informed/aware. Would benefit from skilled PT to address above deficits and promote optimal return to PLOF; recommend transition to STR upon discharge from acute hospitalization.  Will continue to monitor as medical status improves.  RN present in room beginning of session for conversion to water seal; notified to re-connect end of session.     Follow Up Recommendations SNF    Equipment Recommendations  Rolling walker with 5" wheels    Recommendations for Other Services       Precautions / Restrictions Precautions Precautions: Fall Precaution Comments: No BP L  UE Restrictions Weight Bearing Restrictions: No      Mobility  Bed Mobility               General bed mobility comments: sitting edge of bed upon arrival to room; up in recliner end of session  Transfers Overall transfer level: Needs assistance Equipment used: 1 person hand held assist Transfers: Sit to/from Stand Sit to Stand: Mod assist         General transfer comment: cuing for hand placement; assist for lift off, postural extension and static balance  Ambulation/Gait Ambulation/Gait assistance: Min assist Ambulation Distance (Feet): 15 Feet Assistive device: Rolling walker (2 wheeled)       General Gait Details: broad BOS with decreased step height/length; guarded trunk posturing with limited trunk rotation; decreased cadence and overall fluidity of gait cycle  Stairs            Wheelchair Mobility    Modified Rankin (Stroke Patients Only)       Balance Overall balance assessment: Needs assistance Sitting-balance support: No upper extremity supported;Feet supported Sitting balance-Leahy Scale: Good     Standing balance support: No upper extremity supported Standing balance-Leahy Scale: Poor                               Pertinent Vitals/Pain Pain Assessment: Faces Faces Pain Scale: Hurts little more Pain Location: R flank Pain Descriptors / Indicators: Aching;Grimacing;Guarding Pain Intervention(s): Limited activity within patient's tolerance;Monitored during session;Repositioned    Home Living Family/patient expects to be discharged to:: Private residence Living Arrangements: Children Available Help at Discharge: Family;Available 24 hours/day Type of Home: House Home Access: Stairs to enter Entrance Stairs-Rails: Left;Right Entrance Stairs-Number of Steps: 4 Home  Layout: One level Home Equipment: Cane - single point;Shower seat;Grab bars - tub/shower;Grab bars - toilet      Prior Function Level of Independence:  Independent         Comments: Indep with ADLs, household and limited community mobility (public transport to/from dialysis) without assist device; home O2 at 4-6L per patient report.  Denies fall history.     Hand Dominance   Dominant Hand: Right    Extremity/Trunk Assessment   Upper Extremity Assessment Upper Extremity Assessment: Generalized weakness ( R UE limited by soreness in R flank, otherwise bilat UEs at least 4-/5)    Lower Extremity Assessment Lower Extremity Assessment: Generalized weakness (grossly at least 4-/5 throughout; generally tremulous with WBing/mobility efforts)       Communication   Communication: No difficulties  Cognition Arousal/Alertness: Awake/alert Behavior During Therapy: WFL for tasks assessed/performed Overall Cognitive Status: Within Functional Limits for tasks assessed                                        General Comments      Exercises Other Exercises Other Exercises: Assisted with upper body dressing, min assist, and lower body dressing, mod assist (for threading, sit/stand and static standing balance) per patient request.  Educated on energy conservation and activity pacing with functional activities.   Assessment/Plan    PT Assessment Patient needs continued PT services  PT Problem List Decreased strength;Decreased range of motion;Decreased activity tolerance;Decreased balance;Decreased mobility;Decreased coordination;Decreased knowledge of use of DME;Decreased knowledge of precautions;Cardiopulmonary status limiting activity;Decreased skin integrity;Pain       PT Treatment Interventions DME instruction;Gait training;Stair training;Functional mobility training;Therapeutic activities;Therapeutic exercise;Balance training;Patient/family education    PT Goals (Current goals can be found in the Care Plan section)  Acute Rehab PT Goals Patient Stated Goal: to get better PT Goal Formulation: With patient Time For  Goal Achievement: 01/23/17 Potential to Achieve Goals: Good    Frequency Min 2X/week   Barriers to discharge        Co-evaluation               AM-PAC PT "6 Clicks" Daily Activity  Outcome Measure Difficulty turning over in bed (including adjusting bedclothes, sheets and blankets)?: Unable Difficulty moving from lying on back to sitting on the side of the bed? : Unable Difficulty sitting down on and standing up from a chair with arms (e.g., wheelchair, bedside commode, etc,.)?: Unable Help needed moving to and from a bed to chair (including a wheelchair)?: A Lot Help needed walking in hospital room?: A Lot Help needed climbing 3-5 steps with a railing? : A Lot 6 Click Score: 9    End of Session Equipment Utilized During Treatment: Gait belt;Oxygen Activity Tolerance: Patient tolerated treatment well Patient left: in chair;with call bell/phone within reach;with chair alarm set Nurse Communication: Mobility status PT Visit Diagnosis: Difficulty in walking, not elsewhere classified (R26.2);Muscle weakness (generalized) (M62.81)    Time: 9735-3299 PT Time Calculation (min) (ACUTE ONLY): 29 min   Charges:   PT Evaluation $PT Eval Moderate Complexity: 1 Mod PT Treatments $Therapeutic Activity: 8-22 mins   PT G Codes:   PT G-Codes **NOT FOR INPATIENT CLASS** Functional Assessment Tool Used: AM-PAC 6 Clicks Basic Mobility Functional Limitation: Mobility: Walking and moving around Mobility: Walking and Moving Around Current Status (M4268): At least 40 percent but less than 60 percent impaired, limited or restricted Mobility: Walking and  Moving Around Goal Status [phone]): At least 1 percent but less than 20 percent impaired, limited or restricted    Shaniece Bussa H. Owens Shark, PT, DPT, NCS 01/09/17, 3:48 PM [phone]

## 2017-01-09 NOTE — Progress Notes (Signed)
Pharmacy Antibiotic Note  Calvin Good is a 76 y.o. male admitted on 12/30/2016 with respiratory failure. Pharmacy has been consulted for cefepime dosing to treat HCAP.  Plan: Will change cefepime to 1 g IV daily for HD dosing.  Height: 5\' 8"  (172.7 cm) Weight: 221 lb 3.2 oz (100.3 kg) IBW/kg (Calculated) : 68.4  Temp (24hrs), Avg:98.1 F (36.7 C), Min:97.7 F (36.5 C), Max:99 F (37.2 C)   Recent Labs Lab 01/03/17 2341 01/04/17 0848 01/05/17 0543 01/06/17 0539 01/06/17 1830 01/07/17 0537 01/08/17 1033  WBC 7.9 7.3 7.0 6.5 10.1 7.9  --   CREATININE 7.26*  --   --  5.74* 6.61* 5.49* 5.41*    Estimated Creatinine Clearance: 12.9 mL/min (A) (by C-G formula based on SCr of 5.41 mg/dL (H)).    Allergies  Allergen Reactions  . Tetracyclines & Related Other (See Comments)    Reaction: Unknown     Antimicrobials this admission: Vancomycin 10/8 >> 10/8 Cefuroxime 10/8 >> 10/9 (surgical prophylaxis) Levofloxacin 10/6 >> 10/9 Cefepime 10/9 >>  Dose adjustments this admission: 10/9 Discontinued levofloxacin due to amiodarone drug interaction causing increased risk of QTc prolongation.  Microbiology results: 10/6 BCx: no growth final 10/2 MRSA PCR: negative  Thank you for allowing pharmacy to be a part of this patient's care.  Lenis Noon, PharmD, BCPS Clinical Pharmacist 01/09/2017 11:33 AM

## 2017-01-09 NOTE — Progress Notes (Signed)
Chaplain received a consult for pt whom nurse stated was depressed current situation and realizing that he's not doing well. [place] met pt. Pt was sitting at the edge of the bed. When [facility] asked pt how he was doing, pt responded by saying praise the Reita Cliche! Pt was coherent but he talked slower and selected his words carefully. Pt was aware of his health challenges and welcome a prayer from [facility] to cope with his current situation. CH offered prayers to pt, but before [place] left the Rm, pt thanked [facility] for visiting with him and asked the Lord to be with [facility] as [place] provide care to other sick people in the hospital. Pt smiled when he said this and welcomed another follow up visit.   01/09/17 1100  Clinical Encounter Type  Visited With Patient;Health care provider  Visit Type Initial;Spiritual support;Other (Comment)  Referral From Nurse  Consult/Referral To Chaplain  Spiritual Encounters  Spiritual Needs Prayer;Other (Comment)

## 2017-01-09 NOTE — Plan of Care (Signed)
Attempted to give meds this AM. Patient struggled with continued with tremors and difficulty breathing.  Currently in 5L and extremely weak. Stated he usually took all meds at once with sips of water.  However, when attempted, patient appeared to choke and split meds and fluid right back out. Dr. Informed and SLP ordered to assess safety. AMS at times.

## 2017-01-09 NOTE — Evaluation (Signed)
Clinical/Bedside Swallow Evaluation Patient Details  Name: Calvin Good MRN: [id number] Date of Birth: 22-Sep-1940  Today's Date: 01/09/2017 Time: SLP Start Time (ACUTE ONLY): 1120 SLP Stop Time (ACUTE ONLY): 1145 SLP Time Calculation (min) (ACUTE ONLY): 25 min  Past Medical History:  Past Medical History:  Diagnosis Date  . Cancer [facility])    Prostate  . CHF (congestive heart failure) ([place])   . Chronic kidney disease   . Complication of anesthesia    hard to wake up  . COPD (chronic obstructive pulmonary disease) ([place])   . Diabetes mellitus without complication ([place])   . Difficult intubation   . Dyspnea   . GERD (gastroesophageal reflux disease)   . Hyperlipidemia   . Hypertension   . Pleural effusion   . Renal insufficiency    Past Surgical History:  Past Surgical History:  Procedure Laterality Date  . APPENDECTOMY    . AV FISTULA PLACEMENT Left 05/30/2016   Procedure: ARTERIOVENOUS (AV) FISTULA CREATION ( BRACHIOCEPHALIC );  Surgeon: [name], MD;  Location: [facility] ORS;  Service: Vascular;  Laterality: Left;  . CAPD INSERTION N/A 05/30/2016   Procedure: LAPAROSCOPIC INSERTION CONTINUOUS AMBULATORY PERITONEAL DIALYSIS  (CAPD) CATHETER;  Surgeon: [name], MD;  Location: [facility] ORS;  Service: Vascular;  Laterality: N/A;  . DIALYSIS/PERMA CATHETER INSERTION    . DIALYSIS/PERMA CATHETER REMOVAL N/A 08/21/2016   Procedure: Dialysis/Perma Catheter Removal;  Surgeon: [name], MD;  Location: [place] CV LAB;  Service: Cardiovascular;  Laterality: N/A;  . JOINT REPLACEMENT     right knee   . LEFT HEART CATH AND CORONARY ANGIOGRAPHY N/A 08/21/2016   Procedure: Left Heart Cath and Coronary Angiography possible PCI;  Surgeon: [name], MD;  Location: [place] CV LAB;  Service: Cardiovascular;  Laterality: N/A;  . postate removal     . PROSTATE SURGERY    . PSEUDOANERYSM COMPRESSION Right 08/27/2016   Procedure: Pseudoanerysm Compression;  Surgeon: Katha Cabal, MD;  Location: [place] CV LAB;  Service: Cardiovascular;  Laterality: Right;  . REMOVAL OF A DIALYSIS CATHETER N/A 08/08/2016   Procedure: REMOVAL OF A DIALYSIS CATHETER;  Surgeon: [name], MD;  Location: [facility] ORS;  Service: Vascular;  Laterality: N/A;  . VIDEO ASSISTED THORACOSCOPY (VATS)/THOROCOTOMY Right 01/06/2017   Procedure: PREOPERATIVE BRONCHOSCOPY, RIGHT VIDEO ASSISTED THORACOSCOPY (VATS) WITH TALC PLEURODESIS;  Surgeon: Nestor Lewandowsky, MD;  Location: [facility] ORS;  Service: General;  Laterality: Right;   HPI:  76 year old male with past medical history significant for end-stage renal disease on hemodialysis, congestive heart failure, COPD  on 2L home oxygen, diabetes and hypertension presents to hospital secondary to worsening dyspnea.  Pt admitted with paroxysmal atrial fibrillation with rapid ventricular response and Ac on [facility] respiratory failure with hypoxia due to pleural effusion.  CXR 10/9 1. Tubing noted over the right chest with tip over the lower right chest as previously noted is in unchanged position. This may represent a chest tube. No pneumothorax. 2.  Interim improvement of mediastinal prominence. 3. Progressive bilateral lower lobe atelectasis. Left base infiltrate cannot be excluded. Small left pleural effusion cannot be excluded. RN Attempted to give meds this AM. Patient struggled with continued tremors and difficulty breathing.  Currently in 5L and extremely weak. Stated he usually took all meds at once with sips of water.  However, when attempted, patient appeared to choke and split meds and fluid right back out. SLP ordered to assess safety.  Pt seen at bedside to assess  swallow function.  Pt reported that he struggled with meds this AM d/t taking several pills with warm coffee.  He denied usual difficulty with such.  He also denied any coughing/choking with meals.   Assessment / Plan / Recommendation Clinical Impression  Pt seen at bedside for clinical swallow  evaluation.  Oral mech exam revealed [facility] strength and range of motion.  Pt tolerated all trialed consistencies without overt s/sx of aspiration - clear vocal quality, no coughing, no throat clearing.  Pt denies hx of dysphagia.  Of note, pt (+) weak breathy vocal quality, likely secondary to respiratory status.  He would benefit from standard aspiration precautions d/t such.  Recommend regular diet with unrestricted liquids - small bites and sips, oral care BID, and take one pill at a time during medication administration.  SLP to sign off at this time, however remains available as indicated. SLP Visit Diagnosis: Dysphagia, oropharyngeal phase (R13.12)    Aspiration Risk  Mild aspiration risk    Diet Recommendation Thin liquid;Regular   Liquid Administration via: Cup;Straw Medication Administration: Other (Comment) (One pill at a time with liquid or puree) Supervision: Patient able to self feed Compensations: Small sips/bites Postural Changes: Seated upright at 90 degrees    Other  Recommendations Oral Care Recommendations: Oral care BID;Patient independent with oral care   Follow up Recommendations None        Swallow Study   General Date of Onset: 01/09/17 HPI: 76 year old male with past medical history significant for end-stage renal disease on hemodialysis, congestive heart failure, COPD  on 2L home oxygen, diabetes and hypertension presents to hospital secondary to worsening dyspnea.  Pt admitted with paroxysmal atrial fibrillation with rapid ventricular response and Ac on [facility] respiratory failure with hypoxia due to pleural effusion.  CXR 10/9 1. Tubing noted over the right chest with tip over the lower right chest as previously noted is in unchanged position. This may represent a chest tube. No pneumothorax. 2.  Interim improvement of mediastinal prominence. 3. Progressive bilateral lower lobe atelectasis. Left base infiltrate cannot be excluded. Small left pleural effusion cannot be  excluded. RN Attempted to give meds this AM. Patient struggled with continued tremors and difficulty breathing.  Currently in 5L and extremely weak. Stated he usually took all meds at once with sips of water.  However, when attempted, patient appeared to choke and split meds and fluid right back out. SLP ordered to assess safety.  Pt seen at bedside to assess swallow function.  Pt reported that he struggled with meds this AM d/t taking several pills with warm coffee.  He denied usual difficulty with such.  He also denied any coughing/choking with meals. Type of Study: Bedside Swallow Evaluation Diet Prior to this Study: Regular;Thin liquids Temperature Spikes Noted: No Respiratory Status: Nasal cannula History of Recent Intubation: No Behavior/Cognition: Alert;Cooperative;Pleasant mood Oral Cavity Assessment: Within Functional Limits Oral Care Completed by SLP: No Oral Cavity - Dentition: Adequate natural dentition Vision: Functional for self-feeding Self-Feeding Abilities: Able to feed self Patient Positioning: Upright in bed Baseline Vocal Quality: Breathy;Low vocal intensity Volitional Cough: Strong Volitional Swallow: Able to elicit    Oral/Motor/Sensory Function Overall Oral Motor/Sensory Function: Within functional limits   Ice Chips Ice chips: Not tested   Thin Liquid Thin Liquid: Within functional limits Presentation: Straw    Nectar Thick Nectar Thick Liquid: Not tested   Honey Thick Honey Thick Liquid: Not tested   Puree Puree: Within functional limits Presentation: Self Fed   Solid  GO   Solid: Within functional limits Presentation: Self Fed        Halden Phegley 01/09/2017,12:40 PM

## 2017-01-09 NOTE — Progress Notes (Signed)
Inpatient Diabetes Program Recommendations  AACE/ADA: New Consensus Statement on Inpatient Glycemic Control (2015)  Target Ranges:  Prepandial:   less than 140 mg/dL      Peak postprandial:   less than 180 mg/dL (1-2 hours)      Critically ill patients:  140 - 180 mg/dL   Lab Results  Component Value Date   GLUCAP 297 (H) 01/09/2017   HGBA1C 8.4 (H) 11/25/2016    Review of Glycemic Control   Results for Calvin Good, Calvin Good (MRN [id number]) as of 01/09/2017 10:35  Ref. Range 01/08/2017 07:49 01/08/2017 11:50 01/08/2017 16:54 01/08/2017 22:39 01/09/2017 07:28  Glucose-Capillary Latest Ref Range: 65 - 99 mg/dL 282 (H) 316 (H) 194 (H) 354 (H) 297 (H)   Diabetes history: Type 2 Outpatient Diabetes medications: Levemir 30 units QHS, Humalog 10 units tid with meals  Current orders for Inpatient glycemic control: Levemir 20 units QHS, Novolog Sensitive Correction 0-9 units tid + novolog HS scale 0-5 units + Novolog 3 units tid meal coverage  Inpatient Diabetes Program Recommendations:   Patient takes more Levemir at home. Consider increasing Levemir to 24 units.  Patient on more meal coverage at home. If steroids are continued, please consider increasing Novolog meal coverage to 6 units TID with meals. Continue Novolog correction as ordered.  Gentry Fitz, RN, BA, MHA, CDE Diabetes Coordinator Inpatient Diabetes Program  [phone] (Team Pager) [phone] ([place]) 01/09/2017 10:36 AM

## 2017-01-10 LAB — CBC
HCT: 32.6 % — ABNORMAL LOW (ref 40.0–52.0)
Hemoglobin: 10.9 g/dL — ABNORMAL LOW (ref 13.0–18.0)
MCH: 29.6 pg (ref 26.0–34.0)
MCHC: 33.4 g/dL (ref 32.0–36.0)
MCV: 88.7 fL (ref 80.0–100.0)
PLATELETS: 177 10*3/uL (ref 150–440)
RBC: 3.68 MIL/uL — AB (ref 4.40–5.90)
RDW: 14.9 % — ABNORMAL HIGH (ref 11.5–14.5)
WBC: 8.1 10*3/uL (ref 3.8–10.6)

## 2017-01-10 LAB — GLUCOSE, CAPILLARY
GLUCOSE-CAPILLARY: 309 mg/dL — AB (ref 65–99)
GLUCOSE-CAPILLARY: 294 mg/dL — AB (ref 65–99)
GLUCOSE-CAPILLARY: 191 mg/dL — AB (ref 65–99)
Glucose-Capillary: 199 mg/dL — ABNORMAL HIGH (ref 65–99)
Glucose-Capillary: 252 mg/dL — ABNORMAL HIGH (ref 65–99)

## 2017-01-10 LAB — PHOSPHORUS: Phosphorus: 5.9 mg/dL — ABNORMAL HIGH (ref 2.5–4.6)

## 2017-01-10 MED ORDER — IPRATROPIUM-ALBUTEROL 0.5-2.5 (3) MG/3ML IN SOLN
3.0000 mL | Freq: Three times a day (TID) | RESPIRATORY_TRACT | Status: DC
  Administered 2017-01-11 – 2017-01-15 (×11): 3 mL via RESPIRATORY_TRACT
  Filled 2017-01-10 (×12): qty 3

## 2017-01-10 MED ORDER — POLYETHYLENE GLYCOL 3350 17 G PO PACK
17.0000 g | PACK | Freq: Every day | ORAL | Status: DC | PRN
  Filled 2017-01-10: qty 1

## 2017-01-10 MED ORDER — SENNOSIDES-DOCUSATE SODIUM 8.6-50 MG PO TABS
1.0000 | ORAL_TABLET | Freq: Two times a day (BID) | ORAL | Status: DC
  Administered 2017-01-10 – 2017-01-16 (×11): 1 via ORAL
  Filled 2017-01-10 (×12): qty 1

## 2017-01-10 MED ORDER — INSULIN DETEMIR 100 UNIT/ML ~~LOC~~ SOLN
28.0000 [IU] | Freq: Every day | SUBCUTANEOUS | Status: DC
  Administered 2017-01-10: 28 [IU] via SUBCUTANEOUS
  Filled 2017-01-10 (×2): qty 0.28

## 2017-01-10 NOTE — Care Management (Signed)
Chest tube still in place

## 2017-01-10 NOTE — Progress Notes (Signed)
[facility], [place] 01/10/17  Subjective:  Pt seen at bedside. Still has chest tube in place. Due for HD again today.   Objective:  Vital signs in last 24 hours:  Temp:  [97.7 F (36.5 C)-98.2 F (36.8 C)] 98.2 F (36.8 C) (10/12 1606) Pulse Rate:  [67-86] 86 (10/12 1606) Resp:  [10-23] 10 (10/12 1606) BP: (115-148)/(40-112) 148/43 (10/12 1606) SpO2:  [77 %-100 %] 99 % (10/12 1606) Weight:  [103.2 kg (227 lb 8 oz)-104.1 kg (229 lb 8 oz)] 104.1 kg (229 lb 8 oz) (10/12 1230)  Weight change: 2.858 kg (6 lb 4.8 oz) Filed Weights   01/09/17 0157 01/10/17 0500 01/10/17 1230  Weight: 100.3 kg (221 lb 3.2 oz) 103.2 kg (227 lb 8 oz) 104.1 kg (229 lb 8 oz)    Intake/Output:    Intake/Output Summary (Last 24 hours) at 01/10/17 1647 Last data filed at 01/10/17 1641  Gross per 24 hour  Intake              483 ml  Output             2295 ml  Net            -1812 ml     Physical Exam: General: Chronically ill-appearing  HEENT moist oral mucus membranes  Neck supple  Pulm/lungs Scattered rhonchi bilateral, normal effort, chest tube on left  CVS/Heart Irregular  Abdomen:  Soft, nontender, BS present  Extremities: 1+ peripheral edema  Neurologic: Alert and oriented x 3 follows commands  Skin: No acute rashes  access Left arm AV fistula       Basic Metabolic Panel:    Recent Labs Lab 01/03/17 2341 01/06/17 0539 01/06/17 1830 01/07/17 0537 01/07/17 0927 01/08/17 1033 01/09/17 1540 01/10/17 0009  NA 130* 134* 133* 135  --  132*  --   --   K 4.2 4.5 5.0 4.8  --  4.2  --   --   CL 91* 96* 94* 98*  --  92*  --   --   CO2 25 27 25 26   --  27  --   --   GLUCOSE 212* 195* 185* 155*  --  297*  --   --   BUN 81* 53* 57* 42*  --  48*  --   --   CREATININE 7.26* 5.74* 6.61* 5.49*  --  5.41*  --   --   CALCIUM 8.2* 8.3* 8.6* 8.2*  --  8.2*  --   --   PHOS  --   --   --   --  5.6*  --  6.1* 5.9*     CBC:  Recent Labs Lab 01/05/17 0543  01/06/17 0539 01/06/17 1830 01/07/17 0537 01/10/17 0009  WBC 7.0 6.5 10.1 7.9 8.1  NEUTROABS  --   --   --  6.3  --   HGB 9.1* 9.4* 10.6* 9.5* 10.9*  HCT 26.9* 26.9* 31.8* 28.3* 32.6*  MCV 85.7 86.9 88.7 89.0 88.7  PLT 105* 114* 137* 131* 177      Lab Results  Component Value Date   HEPBSAG Negative 11/27/2016      Microbiology:  No results found for this or any previous visit (from the past 240 hour(s)).  Coagulation Studies: No results for input(s): LABPROT, INR in the last 72 hours.  Urinalysis: No results for input(s): COLORURINE, LABSPEC, PHURINE, GLUCOSEU, HGBUR, BILIRUBINUR, KETONESUR, PROTEINUR, UROBILINOGEN, NITRITE, LEUKOCYTESUR in the last 72 hours.  Invalid  input(s): APPERANCEUR    Imaging: No results found.   Medications:   . sodium chloride Stopped (01/06/17 2100)  . ceFEPime (MAXIPIME) IV Stopped (01/09/17 2053)   . amiodarone  400 mg Oral Daily  . aspirin EC  81 mg Oral Daily  . atorvastatin  20 mg Oral QHS  . budesonide (PULMICORT) nebulizer solution  0.5 mg Nebulization BID  . calcium acetate  667 mg Oral TID WC  . carvedilol  6.25 mg Oral BID  . chlorpheniramine-HYDROcodone  5 mL Oral Q12H  . doxazosin  8 mg Oral QPM  . epoetin (EPOGEN/PROCRIT) injection  4,000 Units Intravenous Q M,W,F-HD  . feeding supplement (NEPRO CARB STEADY)  237 mL Oral BID BM  . fluticasone  1 spray Each Nare Daily  . furosemide  40 mg Oral BID  . guaiFENesin  600 mg Oral BID  . heparin subcutaneous  5,000 Units Subcutaneous Q8H  . insulin aspart  0-5 Units Subcutaneous QHS  . insulin aspart  0-9 Units Subcutaneous TID WC  . insulin aspart  6 Units Subcutaneous TID WC  . insulin detemir  28 Units Subcutaneous QHS  . ipratropium-albuterol  3 mL Nebulization Q6H  . isosorbide mononitrate  60 mg Oral Daily  . lisinopril  20 mg Oral QPM  . methylPREDNISolone (SOLU-MEDROL) injection  20 mg Intravenous BID  . multivitamin  1 tablet Oral QHS  . pantoprazole  40 mg  Oral Daily  . senna-docusate  1 tablet Oral BID  . sodium chloride flush  3 mL Intravenous Q12H  . spironolactone  25 mg Oral BID   sodium chloride, acetaminophen **OR** acetaminophen, benzonatate, fentaNYL (SUBLIMAZE) injection, HYDROcodone-acetaminophen, ipratropium-albuterol, lidocaine-prilocaine, morphine injection, ondansetron (ZOFRAN) IV, phenylephrine, polyethylene glycol, senna-docusate, sodium chloride, sodium chloride flush, traMADol  Assessment/ Plan:  76 y.o. male with diabetes mellitus type II insulin dependent, hypertension, coronary artery disease, hyperlipidemia, prostate cancer status post prostatectomy, right total knee, appendectomy.   MWF [facility] [place] AVF  1. End Stage Renal Disease MWF:  - Pt due for HD today, will plan for HD again, orders prepared.   2. Anemia of chronic kidney disease. - Hgb currently 10.9, continue epogen.   3. 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  - Management as per hospitalist.    4. Secondary Hyperparathyroidism:  - Phos slighlty high at 5.9, continue calcium acetate.   5. A Fib with RVR:  Continue amiodarone 400 mg by mouth daily.  6. Acute respiratory failure. Chest tube output yesterday was 1liter.  Dr. Genevive Bi following.    LOS: [place] 10/12/20184:47 PM  [place], [place]

## 2017-01-10 NOTE — Progress Notes (Signed)
HD STARTED  

## 2017-01-10 NOTE — Progress Notes (Signed)
[place] at [place] NAME: Calvin Good    MR#:  [id number]  DATE OF BIRTH:  06-12-1940  SUBJECTIVE:  CHIEF COMPLAINT:   Chief Complaint  Patient presents with  . Chest Pain  - s/p VATs and pleurodesis. Remains requiring Oxygen,and have drainage from chest tube. Comfortable today on 5 ltr oxygen. Have some constipation.  REVIEW OF SYSTEMS:  Review of Systems  Constitutional: Positive for malaise/fatigue. Negative for chills and fever.  HENT: Positive for congestion. Negative for ear discharge, hearing loss and nosebleeds.   Eyes: Negative for blurred vision and double vision.  Respiratory: Positive for cough and shortness of breath. Negative for wheezing.   Cardiovascular: Positive for orthopnea. Negative for chest pain and palpitations.  Gastrointestinal: Negative for abdominal pain, constipation, diarrhea, nausea and vomiting.  Genitourinary: Negative for dysuria.  Musculoskeletal: Negative for myalgias.  Neurological: Negative for dizziness, speech change, focal weakness, seizures and headaches.  Psychiatric/Behavioral: Negative for depression.    DRUG ALLERGIES:   Allergies  Allergen Reactions  . Tetracyclines & Related Other (See Comments)    Reaction: Unknown     VITALS:  Blood pressure (!) 138/46, pulse 80, temperature 98.7 F (37.1 C), temperature source Oral, resp. rate 18, height 5\' 8"  (1.727 m), weight 104.1 kg (229 lb 8 oz), SpO2 97 %.  PHYSICAL EXAMINATION:  Physical Exam  GENERAL:  76 y.o.-year-old patient lying in the bed, Appears short of breath with minimal exertion EYES: Pupils equal, round, reactive to light and accommodation. No scleral icterus. Extraocular muscles intact.  HEENT: Head atraumatic, normocephalic. Oropharynx and nasopharynx clear.  NECK:  Supple, no jugular venous distention. No thyroid enlargement, no tenderness.  LUNGS: Normal breath sounds bilaterally, no wheezing, have some crepitation.  No use of accessory muscles of respiration. Decreased bibasilar breath sounds, chest tube in place, On nasal canula oxygen today. CARDIOVASCULAR: S1, S2 normal. No  rubs, or gallops. 2/6 systolic murmur is present ABDOMEN: Soft, nontender, nondistended. Bowel sounds present. No organomegaly or mass.  EXTREMITIES: No pedal edema, cyanosis, or clubbing. Left arm AV fistula present NEUROLOGIC: Cranial nerves II through XII are intact. Muscle strength 3-4/5 in all extremities. Sensation intact. Gait not checked.  PSYCHIATRIC: The patient is alert,  oriented X3.  SKIN: No obvious rash, lesion, or ulcer.    LABORATORY PANEL:   CBC  Recent Labs Lab 01/10/17 0009  WBC 8.1  HGB 10.9*  HCT 32.6*  PLT 177   ------------------------------------------------------------------------------------------------------------------  Chemistries   Recent Labs Lab 01/08/17 1033  NA 132*  K 4.2  CL 92*  CO2 27  GLUCOSE 297*  BUN 48*  CREATININE 5.41*  CALCIUM 8.2*   ------------------------------------------------------------------------------------------------------------------  Cardiac Enzymes No results for input(s): TROPONINI in the last 168 hours. ------------------------------------------------------------------------------------------------------------------  RADIOLOGY:  No results found.  EKG:   Orders placed or performed during the hospital encounter of 12/30/16  . ED EKG within 10 minutes  . ED EKG within 10 minutes  . EKG 12-Lead  . EKG 12-Lead    ASSESSMENT AND PLAN:   76 year old male with past medical history significant for end-stage renal disease on hemodialysis, congestive heart failure, COPD on 2L home oxygen, diabetes and hypertension presents to hospital secondary to worsening dyspnea.  #1 paroxysmal atrial fibrillation with rapid ventricular response- -received amiodarone drip, rate is better controlled. -On oral amiodarone at this time and also  carvedilol. -Was not on anticoagulation as outpatient due to GI bleed.  - After procedure- on [place]  heparine, may need to decide about oral anticoagulant once allowed by surgery and chest tube is out. ( Dr. Nehemiah Massed on 10/3 and 10/4 suggested to start anticoagulants once able, for risk reduction of stroke)   #2 Ac on [facility] respi failure with hypoxia due to pleural effusion  acute on chronic combined CHF, last EF of 50-55%.  -Appreciate cardiology consult. Continue oral Lasix at this time. Appears well compensated -History of recurrent right pleural effusion. Appreciate thoraco-surgical consult. - talc pleurodesis done- hold heparin ( was on IV heparin drip before the procedure) -Empirically on Levaquin for right lower lobe infiltrate. - Requiring Bipap post-procedure, Monitor in stepdown unit, Improved and now back on nasal canula oxygen.  #3diabetes mellitus-continue Levemir and sliding scale insulin. Also on aspart insulin prior to meals.  #4 end-stage renal disease on hemodialysis-appreciate nephrology input.  On Monday Wednesday Friday dialysis.   #5 COPD-stable at this time. On 3-6L home oxygen -Continue inhalers. Continue outpatient pulmonary follow-up - Due to respi failure post procedure, On Bipap- monitored in step down unit. - Improved now, transfer to floor.  #6  CAD-with chronic stable angina. Status post cardiac catheterization in May 2018 with diffuse RCA disease and collaterals LAD, left circumflex 75% stenosis. Troponins were elevated on admission. Cardiology recommended medical management. -Continue Imdur  #7 DVT prophylaxis-on heparin [place] this time  Discussed with pt about discharge planning and code status.   He said, his daughter is POA and She have papers.  Once chest tube Is out, may work on d/c planning.  All the records are reviewed and case discussed with Care Management/Social Workerr. Management plans discussed with the patient, family and they are in  agreement.  CODE STATUS: Full code  TOTAL TIME TAKING CARE OF THIS PATIENT: 35 minutes.   POSSIBLE D/C IN 2 DAYS, DEPENDING ON CLINICAL CONDITION.   Vaughan Basta M.D on 01/10/2017 at 9:04 PM  Between 7am to 6pm - Pager - [phone]  After 6pm go to [email or web address] - password [email or web address] [facility]  Office  [phone]  CC: Primary care physician; [name], MD

## 2017-01-10 NOTE — Progress Notes (Signed)
This note also relates to the following rows which could not be included: Pulse Rate - Cannot attach notes to unvalidated device data Resp - Cannot attach notes to unvalidated device data BP - Cannot attach notes to unvalidated device data  HD COMPLETED  

## 2017-01-10 NOTE — Progress Notes (Signed)
PT Cancellation Note  Patient Details Name: Calvin Good MRN: [id number] DOB: 1940-04-12   Cancelled Treatment:    Reason Eval/Treat Not Completed: Patient at procedure or test/unavailable (Patient currently off unit at dialysis; will re-attempt treatment session at later time/date as patient medically appropriate and available.)   Hershal Eriksson H. Owens Shark, PT, DPT, NCS 01/10/17, 2:32 PM [phone]

## 2017-01-10 NOTE — Progress Notes (Signed)
Pharmacy Antibiotic Note  Joban Colledge is a 76 y.o. male admitted on 12/30/2016 with respiratory failure. Pharmacy has been consulted for cefepime dosing to treat HCAP.  Plan: Continue cefepime to 1 g IV daily for HD dosing.  Height: 5\' 8"  (172.7 cm) Weight: 227 lb 8 oz (103.2 kg) IBW/kg (Calculated) : 68.4  Temp (24hrs), Avg:97.7 F (36.5 C), Min:97.3 F (36.3 C), Max:98 F (36.7 C)   Recent Labs Lab 01/03/17 2341  01/05/17 0543 01/06/17 0539 01/06/17 1830 01/07/17 0537 01/08/17 1033 01/10/17 0009  WBC 7.9  < > 7.0 6.5 10.1 7.9  --  8.1  CREATININE 7.26*  --   --  5.74* 6.61* 5.49* 5.41*  --   < > = values in this interval not displayed.  Estimated Creatinine Clearance: 13.1 mL/min (A) (by C-G formula based on SCr of 5.41 mg/dL (H)).    Allergies  Allergen Reactions  . Tetracyclines & Related Other (See Comments)    Reaction: Unknown     Antimicrobials this admission: Vancomycin 10/8 >> 10/8 Cefuroxime 10/8 >> 10/9 (surgical prophylaxis) Levofloxacin 10/6 >> 10/9 Cefepime 10/9 >>  Dose adjustments this admission: 10/9 Discontinued levofloxacin due to amiodarone drug interaction causing increased risk of QTc prolongation.  Microbiology results: 10/6 BCx: no growth final 10/2 MRSA PCR: negative  Thank you for allowing pharmacy to be a part of this patient's care.  Lenis Noon, PharmD, BCPS Clinical Pharmacist 01/10/2017 12:05 PM

## 2017-01-10 NOTE — Progress Notes (Signed)
Okay per Dr. Genevive Bi for patient to transport to dialysis with chest tube off of suction.

## 2017-01-11 LAB — GLUCOSE, CAPILLARY
GLUCOSE-CAPILLARY: 247 mg/dL — AB (ref 65–99)
GLUCOSE-CAPILLARY: 235 mg/dL — AB (ref 65–99)
GLUCOSE-CAPILLARY: 247 mg/dL — AB (ref 65–99)
Glucose-Capillary: 324 mg/dL — ABNORMAL HIGH (ref 65–99)
Glucose-Capillary: 337 mg/dL — ABNORMAL HIGH (ref 65–99)

## 2017-01-11 MED ORDER — BISACODYL 10 MG RE SUPP: 10.0000 mg | Freq: Every day | RECTAL | Status: DC | PRN

## 2017-01-11 MED ORDER — GUAIFENESIN-DM 100-10 MG/5ML PO SYRP
5.0000 mL | ORAL_SOLUTION | ORAL | Status: DC | PRN
  Administered 2017-01-11 – 2017-01-12 (×4): 5 mL via ORAL
  Filled 2017-01-11 (×4): qty 5

## 2017-01-11 MED ORDER — INSULIN DETEMIR 100 UNIT/ML ~~LOC~~ SOLN
33.0000 [IU] | Freq: Every day | SUBCUTANEOUS | Status: DC
  Administered 2017-01-11 – 2017-01-13 (×3): 33 [IU] via SUBCUTANEOUS
  Filled 2017-01-11 (×4): qty 0.33

## 2017-01-11 MED ORDER — PREDNISONE 20 MG PO TABS
10.0000 mg | ORAL_TABLET | Freq: Every day | ORAL | Status: AC
  Administered 2017-01-12 – 2017-01-14 (×3): 10 mg via ORAL
  Filled 2017-01-11 (×3): qty 1

## 2017-01-11 MED ORDER — POLYETHYLENE GLYCOL 3350 17 G PO PACK
17.0000 g | PACK | Freq: Two times a day (BID) | ORAL | Status: AC
  Administered 2017-01-11 – 2017-01-13 (×5): 17 g via ORAL
  Filled 2017-01-11 (×5): qty 1

## 2017-01-11 NOTE — Progress Notes (Signed)
[facility], [place] 01/11/17  Subjective:  Chest tube output did decrease yesterday. Output yesterday was 225 cc. Patient had hemodialysis yesterday.  Objective:  Vital signs in last 24 hours:  Temp:  [97.6 F (36.4 C)-98.7 F (37.1 C)] 97.7 F (36.5 C) (10/13 1344) Pulse Rate:  [67-86] 70 (10/13 1344) Resp:  [10-23] 14 (10/13 1344) BP: (115-151)/(30-92) 149/42 (10/13 1344) SpO2:  [93 %-99 %] 98 % (10/13 1344) Weight:  [100.4 kg (221 lb 5.5 oz)] 100.4 kg (221 lb 5.5 oz) (10/13 0500)  Weight change: 0.907 kg (2 lb) Filed Weights   01/10/17 0500 01/10/17 1230 01/11/17 0500  Weight: 103.2 kg (227 lb 8 oz) 104.1 kg (229 lb 8 oz) 100.4 kg (221 lb 5.5 oz)    Intake/Output:    Intake/Output Summary (Last 24 hours) at 01/11/17 1426 Last data filed at 01/11/17 1300  Gross per 24 hour  Intake              812 ml  Output             2225 ml  Net            -1413 ml     Physical Exam: General: Chronically ill-appearing  HEENT moist oral mucus membranes  Neck supple  Pulm/lungs Scattered rhonchi bilateral, normal effort, chest tube on left  CVS/Heart Irregular  Abdomen:  Soft, nontender, BS present  Extremities: 2+ peripheral edema  Neurologic: Alert and oriented x 3 follows commands  Skin: No acute rashes  access Left arm AV fistula       Basic Metabolic Panel:    Recent Labs Lab 01/06/17 0539 01/06/17 1830 01/07/17 0537 01/07/17 0927 01/08/17 1033 01/09/17 1540 01/10/17 0009  NA 134* 133* 135  --  132*  --   --   K 4.5 5.0 4.8  --  4.2  --   --   CL 96* 94* 98*  --  92*  --   --   CO2 27 25 26   --  27  --   --   GLUCOSE 195* 185* 155*  --  297*  --   --   BUN 53* 57* 42*  --  48*  --   --   CREATININE 5.74* 6.61* 5.49*  --  5.41*  --   --   CALCIUM 8.3* 8.6* 8.2*  --  8.2*  --   --   PHOS  --   --   --  5.6*  --  6.1* 5.9*     CBC:  Recent Labs Lab 01/05/17 0543 01/06/17 0539 01/06/17 1830 01/07/17 0537  01/10/17 0009  WBC 7.0 6.5 10.1 7.9 8.1  NEUTROABS  --   --   --  6.3  --   HGB 9.1* 9.4* 10.6* 9.5* 10.9*  HCT 26.9* 26.9* 31.8* 28.3* 32.6*  MCV 85.7 86.9 88.7 89.0 88.7  PLT 105* 114* 137* 131* 177      Lab Results  Component Value Date   HEPBSAG Negative 11/27/2016      Microbiology:  No results found for this or any previous visit (from the past 240 hour(s)).  Coagulation Studies: No results for input(s): LABPROT, INR in the last 72 hours.  Urinalysis: No results for input(s): COLORURINE, LABSPEC, PHURINE, GLUCOSEU, HGBUR, BILIRUBINUR, KETONESUR, PROTEINUR, UROBILINOGEN, NITRITE, LEUKOCYTESUR in the last 72 hours.  Invalid input(s): APPERANCEUR    Imaging: No results found.   Medications:   . sodium chloride Stopped (01/06/17 2100)   .  amiodarone  400 mg Oral Daily  . aspirin EC  81 mg Oral Daily  . atorvastatin  20 mg Oral QHS  . budesonide (PULMICORT) nebulizer solution  0.5 mg Nebulization BID  . calcium acetate  667 mg Oral TID WC  . carvedilol  6.25 mg Oral BID  . chlorpheniramine-HYDROcodone  5 mL Oral Q12H  . doxazosin  8 mg Oral QPM  . epoetin (EPOGEN/PROCRIT) injection  4,000 Units Intravenous Q M,W,F-HD  . feeding supplement (NEPRO CARB STEADY)  237 mL Oral BID BM  . fluticasone  1 spray Each Nare Daily  . furosemide  40 mg Oral BID  . guaiFENesin  600 mg Oral BID  . heparin subcutaneous  5,000 Units Subcutaneous Q8H  . insulin aspart  0-5 Units Subcutaneous QHS  . insulin aspart  0-9 Units Subcutaneous TID WC  . insulin aspart  6 Units Subcutaneous TID WC  . insulin detemir  28 Units Subcutaneous QHS  . ipratropium-albuterol  3 mL Nebulization TID  . isosorbide mononitrate  60 mg Oral Daily  . lisinopril  20 mg Oral QPM  . methylPREDNISolone (SOLU-MEDROL) injection  20 mg Intravenous BID  . multivitamin  1 tablet Oral QHS  . pantoprazole  40 mg Oral Daily  . polyethylene glycol  17 g Oral BID  . senna-docusate  1 tablet Oral BID  .  sodium chloride flush  3 mL Intravenous Q12H  . spironolactone  25 mg Oral BID   sodium chloride, acetaminophen **OR** acetaminophen, benzonatate, bisacodyl, fentaNYL (SUBLIMAZE) injection, guaiFENesin-dextromethorphan, HYDROcodone-acetaminophen, ipratropium-albuterol, lidocaine-prilocaine, morphine injection, ondansetron (ZOFRAN) IV, phenylephrine, polyethylene glycol, senna-docusate, sodium chloride, sodium chloride flush, traMADol  Assessment/ Plan:  76 y.o. male with diabetes mellitus type II insulin dependent, hypertension, coronary artery disease, hyperlipidemia, prostate cancer status post prostatectomy, right total knee, appendectomy.   MWF [facility]  1. End Stage Renal Disease MWF:  - patient completed hemodialysis yesterday. No acute indication for dialysis today.  2. Anemia of chronic kidney disease. - Hgb currently 10.9, we will maintain the patient on Epogen.   3. 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  - Management as per hospitalist.    4. Secondary Hyperparathyroidism:  - recheck phosphorus on Monday. Otherwise continue current dosage of calcium acetate.   5. A Fib with RVR:  Continue amiodarone 400 mg by mouth daily.  6. Acute respiratory failure. Overall appears that shortness of breath has improved a bit. Chest tube still in place. Patient has also had extra dialysis this past week.    LOS: 12 Melenda Bielak 10/13/20182:26 PM  [place], [place]

## 2017-01-11 NOTE — Progress Notes (Signed)
[facility] [place] NAME: Calvin Good    MR#:  [id number]  DATE OF BIRTH:  06/21/1940  SUBJECTIVE:  CHIEF COMPLAINT:   Chief Complaint  Patient presents with  . Chest Pain  - s/p VATs and pleurodesis.  chest tube in place Constipated. No BM in 2 weeks  REVIEW OF SYSTEMS:  Review of Systems  Constitutional: Positive for malaise/fatigue. Negative for chills and fever.  HENT: Positive for congestion. Negative for ear discharge, hearing loss and nosebleeds.   Eyes: Negative for blurred vision and double vision.  Respiratory: Positive for cough and shortness of breath. Negative for wheezing.   Cardiovascular: Positive for orthopnea. Negative for chest pain and palpitations.  Gastrointestinal: Negative for abdominal pain, constipation, diarrhea, nausea and vomiting.  Genitourinary: Negative for dysuria.  Musculoskeletal: Negative for myalgias.  Neurological: Negative for dizziness, speech change, focal weakness, seizures and headaches.  Psychiatric/Behavioral: Negative for depression.    DRUG ALLERGIES:   Allergies  Allergen Reactions  . Tetracyclines & Related Other (See Comments)    Reaction: Unknown     VITALS:  Blood pressure (!) 132/42, pulse 74, temperature 97.6 F (36.4 C), temperature source Oral, resp. rate 18, height 5\' 8"  (1.727 m), weight 100.4 kg (221 lb 5.5 oz), SpO2 93 %.  PHYSICAL EXAMINATION:  Physical Exam  GENERAL:  76 y.o.-year-old patient lying in the bed, Appears short of breath with minimal exertion EYES: Pupils equal, round, reactive to light and accommodation. No scleral icterus. Extraocular muscles intact.  HEENT: Head atraumatic, normocephalic. Oropharynx and nasopharynx clear.  NECK:  Supple, no jugular venous distention. No thyroid enlargement, no tenderness.  LUNGS: Normal breath sounds bilaterally, no wheezing, have some crepitation. No use of accessory muscles of respiration. Decreased  bibasilar breath sounds, chest tube in place, On nasal canula oxygen today. CARDIOVASCULAR: S1, S2 normal. No  rubs, or gallops. 2/6 systolic murmur is present ABDOMEN: Soft, nontender, nondistended. Bowel sounds present. No organomegaly or mass.  EXTREMITIES: No pedal edema, cyanosis, or clubbing. Left arm AV fistula present NEUROLOGIC: Cranial nerves II through XII are intact. Muscle strength 3-4/5 in all extremities. Sensation intact. Gait not checked.  PSYCHIATRIC: The patient is alert,  oriented X3.  SKIN: No obvious rash, lesion, or ulcer.    LABORATORY PANEL:   CBC  Recent Labs Lab 01/10/17 0009  WBC 8.1  HGB 10.9*  HCT 32.6*  PLT 177   ------------------------------------------------------------------------------------------------------------------  Chemistries   Recent Labs Lab 01/08/17 1033  NA 132*  K 4.2  CL 92*  CO2 27  GLUCOSE 297*  BUN 48*  CREATININE 5.41*  CALCIUM 8.2*   ------------------------------------------------------------------------------------------------------------------  Cardiac Enzymes No results for input(s): TROPONINI in the last 168 hours. ------------------------------------------------------------------------------------------------------------------  RADIOLOGY:  No results found.  EKG:   Orders placed or performed during the hospital encounter of 12/30/16  . ED EKG within 10 minutes  . ED EKG within 10 minutes  . EKG 12-Lead  . EKG 12-Lead    ASSESSMENT AND PLAN:   76 year old male with past medical history significant for end-stage renal disease on hemodialysis, congestive heart failure, COPD on 2L home oxygen, diabetes and hypertension presents to hospital secondary to worsening dyspnea.  # Paroxysmal atrial fibrillation with rapid ventricular response Was on amiodarone drip and transitioned to oral amiodarone. -Was not on anticoagulation as outpatient due to GI bleed.   # Acute on Chronic respiratory failure  with hypoxia due to pleural effusion  # acute on chronic  combined systolic and diastolic congestive heart failure Improved. On oral Lasix.  # Recurrent right pleural effusion  Appreciate thoraco-surgical consult. - Talc pleurodesis done. -Empirically was on Levaquin for right lower lobe infiltrate.  - Requiring Bipap post-procedure, Monitor in stepdown unit, Improved and now back on nasal canula oxygen.  #diabetes mellitus-continue Levemir and sliding scale insulin. Also on aspart insulin prior to meals.  # end-stage renal disease on hemodialysis-appreciate nephrology input.  On Monday Wednesday Friday dialysis.   # COPD-stable at this time. On 3-6L home oxygen -Continue inhalers. Continue outpatient pulmonary follow-up - Due to respiratory failure post procedure, Was On Bipap- monitored in step down unit.  #  CAD-with chronic stable angina. Status post cardiac catheterization in May 2018 with diffuse RCA disease and collaterals LAD, left circumflex 75% stenosis. Troponins were elevated on admission. Cardiology recommended medical management. -Continue Imdur  # DVT prophylaxis-on heparin  All the records are reviewed and case discussed with Care Management/Social Workerr. Management plans discussed with the patient, family and they are in agreement.  CODE STATUS: Full code  TOTAL TIME TAKING CARE OF THIS PATIENT: 35 minutes.   POSSIBLE D/C IN 2 DAYS, DEPENDING ON CLINICAL CONDITION.  Hillary Bow R M.D on 01/11/2017 at 12:17 PM  Between 7am to 6pm - Pager - [phone]  After 6pm go to [email or web address] - password [email or web address] [place] [facility]  Office  [phone]  CC: Primary care physician; [name], MD

## 2017-01-11 NOTE — Progress Notes (Signed)
Physical Therapy Treatment Patient Details Name: Calvin Good MRN: [id number] DOB: 1941/03/14 Today's Date: 01/11/2017    History of Present Illness presented to ER from dialysis secondary to chest pain, elevated HR (130-150s); admitted with afib with RVR, ultimately requiring amioderone drip for control and conversion to NSR.  Now, status post bronchoscopy with R thoracoscopy, talc pleurodesis (10/8).  Currently on 5L supplemental O2 via [place]    PT Comments    Pt agreeable to PT; denies pain other than in chest with cough. Pt moves slowly requiring Min A for bed mobility and Mod A for sit to/from stand transfers. Bilateral lower extremities weak with trembling with stand, but decreases with re attempt and static stand. Pt able to demonstrate steps to the chair with Min A. Mod A for control of descent to sit. Pt participates in seated and long sit exercises with good effort and rest breaks as needed; fatigues easily, but grateful to rest and continue efforts. Pt received up in chair and mobility discussed with nurse and nursing assistant. Continue PT to progress pt strength, endurance and out of bed tolerance to improve all functional mobility.    Follow Up Recommendations  SNF     Equipment Recommendations  Rolling walker with 5" wheels    Recommendations for Other Services       Precautions / Restrictions Precautions Precautions: Fall Precaution Comments: No BP L UE (chest tube) Restrictions Weight Bearing Restrictions: No    Mobility  Bed Mobility Overal bed mobility: Needs Assistance Bed Mobility: Supine to Sit     Supine to sit: Min assist     General bed mobility comments: Assist for LEs and trunk  Transfers Overall transfer level: Needs assistance Equipment used: Rolling walker (2 wheeled) Transfers: Sit to/from Stand Sit to Stand: Mod assist         General transfer comment: 2 stands. cues for hands; significant trembling in  LEs  Ambulation/Gait Ambulation/Gait assistance: Min assist Ambulation Distance (Feet): 3 Feet Assistive device: Rolling walker (2 wheeled) Gait Pattern/deviations:  (sidestep bed to chair) Gait velocity: slow Gait velocity interpretation: <1.8 ft/sec, indicative of risk for recurrent falls General Gait Details: Better control over LEs than initial stand, but still mildly trembling. No LOB   Stairs            Wheelchair Mobility    Modified Rankin (Stroke Patients Only)       Balance Overall balance assessment: Needs assistance Sitting-balance support: Bilateral upper extremity supported;Feet supported Sitting balance-Leahy Scale: Good     Standing balance support: Bilateral upper extremity supported Standing balance-Leahy Scale: Fair ([place] -) Standing balance comment: intially poor with greater control with static stand time. Able to take steps                            Cognition Arousal/Alertness: Awake/alert Behavior During Therapy: WFL for tasks assessed/performed Overall Cognitive Status: Within Functional Limits for tasks assessed                                        Exercises General Exercises - Lower Extremity Ankle Circles/Pumps: AROM;Both;15 reps Quad Sets: Strengthening;Both;15 reps Gluteal Sets: Strengthening;Both;15 reps Long Arc Quad: AROM;Strengthening;Both;10 reps;Seated Heel Slides: AROM;Both;10 reps Hip ABduction/ADduction: AAROM;Both;15 reps Hip Flexion/Marching: AROM;Strengthening;Both;15 reps;Seated Other Exercises Other Exercises: abdominal brace 10x and with exercises    General Comments  Pertinent Vitals/Pain Pain Assessment: No/denies pain    Home Living                      Prior Function            PT Goals (current goals can now be found in the care plan section) Progress towards PT goals: Progressing toward goals    Frequency    Min 2X/week      PT Plan Current  plan remains appropriate    Co-evaluation              AM-PAC PT "6 Clicks" Daily Activity  Outcome Measure  Difficulty turning over in bed (including adjusting bedclothes, sheets and blankets)?: Unable Difficulty moving from lying on back to sitting on the side of the bed? : Unable Difficulty sitting down on and standing up from a chair with arms (e.g., wheelchair, bedside commode, etc,.)?: Unable Help needed moving to and from a bed to chair (including a wheelchair)?: A Lot Help needed walking in hospital room?: A Lot Help needed climbing 3-5 steps with a railing? : Total 6 Click Score: 8    End of Session Equipment Utilized During Treatment: Gait belt;Oxygen Activity Tolerance: Patient tolerated treatment well;Other (comment) (fatigues, but recouperates with rest) Patient left: in chair;with call bell/phone within reach;with chair alarm set   PT Visit Diagnosis: Difficulty in walking, not elsewhere classified (R26.2);Muscle weakness (generalized) (M62.81)     Time: 8338-2505 PT Time Calculation (min) (ACUTE ONLY): 45 min  Charges:  $Gait Training: 8-22 mins $Therapeutic Exercise: 8-22 mins $Therapeutic Activity: 8-22 mins                    G CodesLarae Grooms, PTA 01/11/2017, 4:06 PM

## 2017-01-11 NOTE — Progress Notes (Signed)
Notified Dr. Len Childs about patient' s  Bp ; verbal order to hold Imdur and monitor BP; patient asymptomatic and verbal responsive; Will continue to monitor

## 2017-01-11 NOTE — Progress Notes (Signed)
Notified MD [place] about patient BP verbal order to hold BP meds this med pass; patient made aware. Will continue to monitor

## 2017-01-12 LAB — GLUCOSE, CAPILLARY
GLUCOSE-CAPILLARY: 213 mg/dL — AB (ref 65–99)
GLUCOSE-CAPILLARY: 184 mg/dL — AB (ref 65–99)
GLUCOSE-CAPILLARY: 82 mg/dL (ref 65–99)
Glucose-Capillary: 151 mg/dL — ABNORMAL HIGH (ref 65–99)

## 2017-01-12 MED ORDER — ISOSORBIDE MONONITRATE ER 30 MG PO TB24
30.0000 mg | ORAL_TABLET | Freq: Every day | ORAL | Status: DC
  Administered 2017-01-13 – 2017-01-16 (×4): 30 mg via ORAL
  Filled 2017-01-12 (×4): qty 1

## 2017-01-12 NOTE — Progress Notes (Signed)
[facility], [place] 01/12/17  Subjective:  Chest tube output increased yesterday to 580 cc. Patient will be due for dialysis again tomorrow.   Objective:  Vital signs in last 24 hours:  Temp:  [97.6 F (36.4 C)-98.3 F (36.8 C)] 98.3 F (36.8 C) (10/14 1255) Pulse Rate:  [62-70] 62 (10/14 1255) Resp:  [14-24] 18 (10/14 1255) BP: (120-150)/(35-45) 132/41 (10/14 1255) SpO2:  [96 %-99 %] 99 % (10/14 1255)  Weight change:  Filed Weights   01/10/17 0500 01/10/17 1230 01/11/17 0500  Weight: 103.2 kg (227 lb 8 oz) 104.1 kg (229 lb 8 oz) 100.4 kg (221 lb 5.5 oz)    Intake/Output:    Intake/Output Summary (Last 24 hours) at 01/12/17 1322 Last data filed at 01/12/17 1100  Gross per 24 hour  Intake              360 ml  Output              175 ml  Net              185 ml     Physical Exam: General: Chronically ill-appearing  HEENT moist oral mucus membranes  Neck supple  Pulm/lungs Scattered rhonchi bilateral, normal effort, chest tube on left  CVS/Heart Irregular  Abdomen:  Soft, nontender, BS present  Extremities: 1+ peripheral edema  Neurologic: Alert and oriented x 3 follows commands  Skin: No acute rashes  access Left arm AV fistula       Basic Metabolic Panel:    Recent Labs Lab 01/06/17 0539 01/06/17 1830 01/07/17 0537 01/07/17 0927 01/08/17 1033 01/09/17 1540 01/10/17 0009  NA 134* 133* 135  --  132*  --   --   K 4.5 5.0 4.8  --  4.2  --   --   CL 96* 94* 98*  --  92*  --   --   CO2 27 25 26   --  27  --   --   GLUCOSE 195* 185* 155*  --  297*  --   --   BUN 53* 57* 42*  --  48*  --   --   CREATININE 5.74* 6.61* 5.49*  --  5.41*  --   --   CALCIUM 8.3* 8.6* 8.2*  --  8.2*  --   --   PHOS  --   --   --  5.6*  --  6.1* 5.9*     CBC:  Recent Labs Lab 01/06/17 0539 01/06/17 1830 01/07/17 0537 01/10/17 0009  WBC 6.5 10.1 7.9 8.1  NEUTROABS  --   --  6.3  --   HGB 9.4* 10.6* 9.5* 10.9*  HCT 26.9* 31.8* 28.3* 32.6*   MCV 86.9 88.7 89.0 88.7  PLT 114* 137* 131* 177      Lab Results  Component Value Date   HEPBSAG Negative 11/27/2016      Microbiology:  No results found for this or any previous visit (from the past 240 hour(s)).  Coagulation Studies: No results for input(s): LABPROT, INR in the last 72 hours.  Urinalysis: No results for input(s): COLORURINE, LABSPEC, PHURINE, GLUCOSEU, HGBUR, BILIRUBINUR, KETONESUR, PROTEINUR, UROBILINOGEN, NITRITE, LEUKOCYTESUR in the last 72 hours.  Invalid input(s): APPERANCEUR    Imaging: No results found.   Medications:   . sodium chloride Stopped (01/06/17 2100)   . amiodarone  400 mg Oral Daily  . aspirin EC  81 mg Oral Daily  . atorvastatin  20 mg Oral  QHS  . budesonide (PULMICORT) nebulizer solution  0.5 mg Nebulization BID  . calcium acetate  667 mg Oral TID WC  . carvedilol  6.25 mg Oral BID  . chlorpheniramine-HYDROcodone  5 mL Oral Q12H  . doxazosin  8 mg Oral QPM  . epoetin (EPOGEN/PROCRIT) injection  4,000 Units Intravenous Q M,W,F-HD  . feeding supplement (NEPRO CARB STEADY)  237 mL Oral BID BM  . fluticasone  1 spray Each Nare Daily  . furosemide  40 mg Oral BID  . guaiFENesin  600 mg Oral BID  . heparin subcutaneous  5,000 Units Subcutaneous Q8H  . insulin aspart  0-5 Units Subcutaneous QHS  . insulin aspart  0-9 Units Subcutaneous TID WC  . insulin aspart  6 Units Subcutaneous TID WC  . insulin detemir  33 Units Subcutaneous QHS  . ipratropium-albuterol  3 mL Nebulization TID  . [START ON 01/13/2017] isosorbide mononitrate  30 mg Oral Daily  . multivitamin  1 tablet Oral QHS  . pantoprazole  40 mg Oral Daily  . polyethylene glycol  17 g Oral BID  . predniSONE  10 mg Oral Q breakfast  . senna-docusate  1 tablet Oral BID  . sodium chloride flush  3 mL Intravenous Q12H  . spironolactone  25 mg Oral BID   sodium chloride, acetaminophen **OR** acetaminophen, benzonatate, bisacodyl, fentaNYL (SUBLIMAZE) injection,  guaiFENesin-dextromethorphan, HYDROcodone-acetaminophen, ipratropium-albuterol, lidocaine-prilocaine, morphine injection, ondansetron (ZOFRAN) IV, phenylephrine, polyethylene glycol, senna-docusate, sodium chloride, sodium chloride flush, traMADol  Assessment/ Plan:  76 y.o. male with diabetes mellitus type II insulin dependent, hypertension, coronary artery disease, hyperlipidemia, prostate cancer status post prostatectomy, right total knee, appendectomy.   MWF [facility] [place] [facility]  1. End Stage Renal Disease MWF:  - patient due for dialysis tomorrow. We will prepare orders.  2. Anemia of chronic kidney disease. - continue the patient on Epogen with dialysis.  3. 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  - Management as per hospitalist.    4. Secondary Hyperparathyroidism:  - continue calcium acetate as prescribed. Follow-up serum phosphorus tomorrow with dialysis.   5. A Fib with RVR:  Continue amiodarone 400 mg by mouth daily.  6. Acute respiratory failure. Respiratory status appears to be stable. Chest tube output was increased yesterday to 580 cc. Await further input from Dr. Faith Rogue.    LOS: [place], Tyna Huertas 10/14/20181:22 PM  [place], [place]

## 2017-01-12 NOTE — Progress Notes (Signed)
[facility] [place] NAME: Calvin Good    MR#:  [id number]  DATE OF BIRTH:  1940/08/12  SUBJECTIVE:  CHIEF COMPLAINT:   Chief Complaint  Patient presents with  . Chest Pain  - s/p VATs and pleurodesis.  chest tube in place Constipated.  SOB persistent. Dry cough.  Feels weak  REVIEW OF SYSTEMS:  Review of Systems  Constitutional: Positive for malaise/fatigue. Negative for chills and fever.  HENT: Positive for congestion. Negative for ear discharge, hearing loss and nosebleeds.   Eyes: Negative for blurred vision and double vision.  Respiratory: Positive for cough and shortness of breath. Negative for wheezing.   Cardiovascular: Positive for orthopnea. Negative for chest pain and palpitations.  Gastrointestinal: Negative for abdominal pain, constipation, diarrhea, nausea and vomiting.  Genitourinary: Negative for dysuria.  Musculoskeletal: Negative for myalgias.  Neurological: Negative for dizziness, speech change, focal weakness, seizures and headaches.  Psychiatric/Behavioral: Negative for depression.    DRUG ALLERGIES:   Allergies  Allergen Reactions  . Tetracyclines & Related Other (See Comments)    Reaction: Unknown     VITALS:  Blood pressure (!) 143/45, pulse 64, temperature 97.8 F (36.6 C), temperature source Oral, resp. rate 16, height 5\' 8"  (1.727 m), weight 100.4 kg (221 lb 5.5 oz), SpO2 98 %.  PHYSICAL EXAMINATION:  Physical Exam  GENERAL:  76 y.o.-year-old patient lying in the bed, Appears short of breath with minimal exertion EYES: Pupils equal, round, reactive to light and accommodation. No scleral icterus. Extraocular muscles intact.  HEENT: Head atraumatic, normocephalic. Oropharynx and nasopharynx clear.  NECK:  Supple, no jugular venous distention. No thyroid enlargement, no tenderness.  LUNGS: Normal breath sounds bilaterally, no wheezing, have some crepitation. No use of accessory muscles of  respiration. Decreased bibasilar breath sounds, chest tube in place, On nasal canula oxygen today. CARDIOVASCULAR: S1, S2 normal. No  rubs, or gallops. 2/6 systolic murmur is present ABDOMEN: Soft, nontender, nondistended. Bowel sounds present. No organomegaly or mass.  EXTREMITIES: No pedal edema, cyanosis, or clubbing. Left arm AV fistula present NEUROLOGIC: Cranial nerves II through XII are intact. Muscle strength 3-4/5 in all extremities. Sensation intact. Gait not checked.  PSYCHIATRIC: The patient is alert,  oriented X3.  SKIN: No obvious rash, lesion, or ulcer.    LABORATORY PANEL:   CBC  Recent Labs Lab 01/10/17 0009  WBC 8.1  HGB 10.9*  HCT 32.6*  PLT 177   ------------------------------------------------------------------------------------------------------------------  Chemistries   Recent Labs Lab 01/08/17 1033  NA 132*  K 4.2  CL 92*  CO2 27  GLUCOSE 297*  BUN 48*  CREATININE 5.41*  CALCIUM 8.2*   ------------------------------------------------------------------------------------------------------------------  Cardiac Enzymes No results for input(s): TROPONINI in the last 168 hours. ------------------------------------------------------------------------------------------------------------------  RADIOLOGY:  No results found.  EKG:   Orders placed or performed during the hospital encounter of 12/30/16  . ED EKG within 10 minutes  . ED EKG within 10 minutes    ASSESSMENT AND PLAN:   76 year old male with past medical history significant for end-stage renal disease on hemodialysis, congestive heart failure, COPD on 2L home oxygen, diabetes and hypertension presents to hospital secondary to worsening dyspnea.  # Recurrent right pleural effusion  Appreciate thoraco-surgical consult. - Talc pleurodesis done. -Empirically was on Levaquin for right lower lobe infiltrate.  - Requiring Bipap post-procedure, Monitor in stepdown unit, Improved and now  back on nasal canula oxygen.  Will check CXR in AM and discuss with surgery.  #  Paroxysmal atrial fibrillation with rapid ventricular response Was on amiodarone drip and transitioned to oral amiodarone. -Was not on anticoagulation as outpatient due to GI bleed.   # Acute on Chronic respiratory failure with hypoxia due to pleural effusion  # Acute on chronic combined systolic and diastolic congestive heart failure Improved. On oral Lasix.  #diabetes mellitus-continue Levemir and sliding scale insulin. Also on aspart insulin prior to meals.  # end-stage renal disease on hemodialysis-appreciate nephrology input.  On Monday Wednesday Friday dialysis.   # COPD-stable at this time. On 3-6L home oxygen -Continue inhalers. Continue outpatient pulmonary follow-up - Due to respiratory failure post procedure, Was On Bipap- monitored in step down unit.  #  CAD-with chronic stable angina. Status post cardiac catheterization in May 2018 with diffuse RCA disease and collaterals LAD, left circumflex 75% stenosis. Troponins were elevated on admission. Cardiology recommended medical management. -Continue Imdur  # DVT prophylaxis-on heparin  All the records are reviewed and case discussed with Care Management/Social Workerr. Management plans discussed with the patient, family and they are in agreement.  CODE STATUS: Full code  TOTAL TIME TAKING CARE OF THIS PATIENT: 35 minutes.   POSSIBLE D/C IN 2 DAYS, DEPENDING ON CLINICAL CONDITION.  Hillary Bow R M.D on 01/12/2017 at 9:42 AM  Between 7am to 6pm - Pager - [phone]  After 6pm go to [email or web address] - password [email or web address] [facility]  Office  [phone]  CC: Primary care physician; [name], MD

## 2017-01-12 NOTE — Clinical Social Work Note (Signed)
CSW met with the patient at bedside to discuss discharge planning. At this time, the patient states that he wants to have more PT attempts before deciding between SNF and Home Health. The patient prefers home health. CSW will continue to follow.  Santiago Bumpers, MSW, Latanya Presser [phone]

## 2017-01-13 ENCOUNTER — Encounter: Payer: Self-pay | Admitting: Internal Medicine

## 2017-01-13 ENCOUNTER — Inpatient Hospital Stay: Payer: Medicare Other

## 2017-01-13 LAB — GLUCOSE, CAPILLARY
GLUCOSE-CAPILLARY: 200 mg/dL — AB (ref 65–99)
GLUCOSE-CAPILLARY: 189 mg/dL — AB (ref 65–99)
GLUCOSE-CAPILLARY: 139 mg/dL — AB (ref 65–99)
GLUCOSE-CAPILLARY: 238 mg/dL — AB (ref 65–99)
Glucose-Capillary: 114 mg/dL — ABNORMAL HIGH (ref 65–99)

## 2017-01-13 LAB — BLOOD GAS, ARTERIAL
ACID-BASE DEFICIT: 1 mmol/L (ref 0.0–2.0)
Bicarbonate: 26.6 mmol/L (ref 20.0–28.0)
FIO2: 0.36
O2 SAT: 94.1 %
PCO2 ART: 58 mmHg — AB (ref 32.0–48.0)
PO2 ART: 81 mmHg — AB (ref 83.0–108.0)
Patient temperature: 37
pH, Arterial: 7.27 — ABNORMAL LOW (ref 7.350–7.450)

## 2017-01-13 LAB — CBC
HEMATOCRIT: 31.3 % — AB (ref 40.0–52.0)
HEMOGLOBIN: 10.3 g/dL — AB (ref 13.0–18.0)
MCH: 29.3 pg (ref 26.0–34.0)
MCHC: 32.9 g/dL (ref 32.0–36.0)
MCV: 89.2 fL (ref 80.0–100.0)
Platelets: 166 10*3/uL (ref 150–440)
RBC: 3.51 MIL/uL — ABNORMAL LOW (ref 4.40–5.90)
RDW: 14.5 % (ref 11.5–14.5)
WBC: 12 10*3/uL — ABNORMAL HIGH (ref 3.8–10.6)

## 2017-01-13 LAB — COMPREHENSIVE METABOLIC PANEL
ALK PHOS: 42 U/L (ref 38–126)
ALT: 12 U/L — ABNORMAL LOW (ref 17–63)
ANION GAP: 12 (ref 5–15)
AST: 13 U/L — ABNORMAL LOW (ref 15–41)
Albumin: 2.5 g/dL — ABNORMAL LOW (ref 3.5–5.0)
BILIRUBIN TOTAL: 0.9 mg/dL (ref 0.3–1.2)
BUN: 110 mg/dL — ABNORMAL HIGH (ref 6–20)
CALCIUM: 8 mg/dL — AB (ref 8.9–10.3)
CO2: 25 mmol/L (ref 22–32)
Chloride: 89 mmol/L — ABNORMAL LOW (ref 101–111)
Creatinine, Ser: 7.61 mg/dL — ABNORMAL HIGH (ref 0.61–1.24)
GFR, EST AFRICAN AMERICAN: 7 mL/min — AB (ref >60)
GFR, EST NON AFRICAN AMERICAN: 6 mL/min — AB (ref >60)
Glucose, Bld: 183 mg/dL — ABNORMAL HIGH (ref 65–99)
Potassium: 5.5 mmol/L — ABNORMAL HIGH (ref 3.5–5.1)
Sodium: 126 mmol/L — ABNORMAL LOW (ref 135–145)
TOTAL PROTEIN: 5.3 g/dL — AB (ref 6.5–8.1)

## 2017-01-13 LAB — MAGNESIUM: Magnesium: 2.1 mg/dL (ref 1.7–2.4)

## 2017-01-13 MED ORDER — MORPHINE SULFATE (PF) 2 MG/ML IV SOLN: 1.0000 mg | INTRAVENOUS | Status: DC | PRN

## 2017-01-13 MED ORDER — TRAMADOL HCL 50 MG PO TABS: 50.0000 mg | ORAL_TABLET | Freq: Two times a day (BID) | ORAL | Status: DC | PRN

## 2017-01-13 MED ORDER — GABAPENTIN 100 MG PO CAPS
100.0000 mg | ORAL_CAPSULE | Freq: Three times a day (TID) | ORAL | Status: DC
  Administered 2017-01-13: 100 mg via ORAL
  Filled 2017-01-13: qty 1

## 2017-01-13 MED ORDER — ALPRAZOLAM 0.25 MG PO TABS
0.2500 mg | ORAL_TABLET | Freq: Once | ORAL | Status: AC
  Administered 2017-01-13: 0.25 mg via ORAL
  Filled 2017-01-13: qty 1

## 2017-01-13 NOTE — Progress Notes (Signed)
Pre HD  

## 2017-01-13 NOTE — Progress Notes (Signed)
Advance care planning  Discussed with patient regarding his COPD, chronic respiratory failure. On discussing CODE STATUS patient mentions that he does not want intubation or resuscitation. Patient is oriented to place and time. Has some myoclonic jerks. Concern regarding his decision making capabilities at this time. Called daughter over the phone. Who is his healthcare power of attorney. She confirms patient has always wanted to be a DO NOT RESUSCITATE. Orders entered.  Time spent 20 minutes.

## 2017-01-13 NOTE — Progress Notes (Signed)
Nutrition Follow-up  DOCUMENTATION CODES:   Obesity unspecified  INTERVENTION:   Recommend check Vitamin D levels  Continue Nepro Shake po BID, each supplement provides 425 kcal and 19 grams protein.  Recommend renal-appropriate multivitamin with minerals (Rena-vite) daily.   Bowel regimen as needed  NUTRITION DIAGNOSIS:   Inadequate oral intake related to poor appetite, acute illness as evidenced by per patient/family report, percent weight loss.  Ongoing - addressing with Nepro.   GOAL:   Patient will meet greater than or equal to 90% of their needs  -progressing   MONITOR:   PO intake, Labs, I & O's, Weight trends, Supplement acceptance  ASSESSMENT:   76 year old male with chronic atrial fibrillation, chronic systolic and diastolic heart failure with most recent EF showing 55% and end-stage renal disease on hemodialysis who presents with chest pain.   -On 10/8 patient underwent bronchoscopy with right VATS and talc pleurodesis for recurrent right-sided pleural effusion. Patient now with right chest tube.  Pt's appetite is improving; eating majority of his meals and drinking Nepro. Pt s/p CSE 10/11 and approved for regular/thin liquid diet. Pt with chest tube in place; documented 121ml output over the last 24 hrs. Pt with constipation; no BM since 10/7. Recommend bowel regimen as needed. Pt with secondary hyperparathyroidism; recommend check Vitamin D levels. Pt with elevated phosphorus and potassium; on phosphorus binders. Pt with weight fluctuations but appears to have gained weight since admit. HD scheduled for today.      Medications reviewed and include: aspirin, Phoslo, epogen, lasix, heparin, insulin, MVI, protonix, miralax, senokot, prednisone, aldactone    Labs reviewed: Na 126(L), K 5.5(H), Cl 89(L), BUN 110(H), creat 7.61(H), Ca 8.0(L) adj. 9.2 wnl, Mg 2.1 wnl, alb 2.5(L) P 5.9(H)- 10/12 Wbc- 12(H), Hgb 10.3(L), Hct 31.3(L) PTH- 101(H)- 10/9  Diet Order:   Diet Carb Modified Fluid consistency: Thin; Room service appropriate? Yes  Skin:  Wound (see comment) (closed incision right chest)  Last BM:  10/7- constipation   Height:   Ht Readings from Last 1 Encounters:  01/06/17 5\' 8"  (1.727 m)    Weight:   Wt Readings from Last 1 Encounters:  01/11/17 221 lb 5.5 oz (100.4 kg)    Ideal Body Weight:  70 kg  BMI:  Body mass index is 33.65 kg/m.  Estimated Nutritional Needs:   Kcal:  2000-2300 (MSJ x 1.2-1.4)  Protein:  100-120g/day  Fluid:  per MD  EDUCATION NEEDS:   No education needs identified at this time  Koleen Distance MS, RD, [place] Pager #[phone] After Hours Pager: [phone]

## 2017-01-13 NOTE — Progress Notes (Signed)
PT Cancellation Note  Patient Details Name: Calvin Good MRN: [id number] DOB: [birth year]   Cancelled Treatment:    Reason Eval/Treat Not Completed: Patient at procedure or test/unavailable. Treatment attempted; pt out of room for dialysis. Re attempt at a later time/date as the schedule allows.    Larae Grooms, PTA 01/13/2017, 12:34 PM

## 2017-01-13 NOTE — Progress Notes (Signed)
Informed by night shift RN that Calvin Good was not given. RN spoke with pharmacy as they were unsure of the order as it said on call to OR and patient was not to be going to the OR today.

## 2017-01-13 NOTE — Progress Notes (Signed)
tx ended     

## 2017-01-13 NOTE — Progress Notes (Signed)
Post HD  

## 2017-01-13 NOTE — Progress Notes (Signed)
HD tx started  

## 2017-01-13 NOTE — Progress Notes (Signed)
Per Dr. Juleen China okay to not give epogen as it was not given in Dialysis, order is for IV administration.

## 2017-01-13 NOTE — Progress Notes (Signed)
[facility] [place] NAME: Calvin Good    MR#:  [id number]  DATE OF BIRTH:  1940-07-31  SUBJECTIVE:  CHIEF COMPLAINT:   Chief Complaint  Patient presents with  . Chest Pain  - s/p VATs and pleurodesis.  chest tube in place  Feels weak.  REVIEW OF SYSTEMS:  Review of Systems  Constitutional: Positive for malaise/fatigue. Negative for chills and fever.  HENT: Positive for congestion. Negative for ear discharge, hearing loss and nosebleeds.   Eyes: Negative for blurred vision and double vision.  Respiratory: Positive for cough and shortness of breath. Negative for wheezing.   Cardiovascular: Positive for orthopnea. Negative for chest pain and palpitations.  Gastrointestinal: Negative for abdominal pain, constipation, diarrhea, nausea and vomiting.  Genitourinary: Negative for dysuria.  Musculoskeletal: Negative for myalgias.  Neurological: Negative for dizziness, speech change, focal weakness, seizures and headaches.  Psychiatric/Behavioral: Negative for depression.    DRUG ALLERGIES:   Allergies  Allergen Reactions  . Tetracyclines & Related Other (See Comments)    Reaction: Unknown     VITALS:  Blood pressure (!) 130/35, pulse 67, temperature (!) 97.5 F (36.4 C), temperature source Oral, resp. rate 11, height 5\' 8"  (1.727 m), weight 100.4 kg (221 lb 5.5 oz), SpO2 97 %.  PHYSICAL EXAMINATION:  Physical Exam  GENERAL:  76 y.o.-year-old patient lying in the bed, Appears short of breath with minimal exertion EYES: Pupils equal, round, reactive to light and accommodation. No scleral icterus. Extraocular muscles intact.  HEENT: Head atraumatic, normocephalic. Oropharynx and nasopharynx clear.  NECK:  Supple, no jugular venous distention. No thyroid enlargement, no tenderness.  LUNGS: Normal breath sounds bilaterally, no wheezing, have some crepitation. No use of accessory muscles of respiration. Decreased bibasilar breath  sounds, chest tube in place, On nasal canula oxygen today. CARDIOVASCULAR: S1, S2 normal. No  rubs, or gallops. 2/6 systolic murmur is present ABDOMEN: Soft, nontender, nondistended. Bowel sounds present. No organomegaly or mass.  EXTREMITIES: No pedal edema, cyanosis, or clubbing. Left arm AV fistula present NEUROLOGIC: Cranial nerves II through XII are intact. Muscle strength 3-4/5 in all extremities. Sensation intact. Gait not checked.  Myoclonic jerks PSYCHIATRIC: The patient is alert and awake SKIN: No obvious rash, lesion, or ulcer.    LABORATORY PANEL:   CBC  Recent Labs Lab 01/13/17 0736  WBC 12.0*  HGB 10.3*  HCT 31.3*  PLT 166   ------------------------------------------------------------------------------------------------------------------  Chemistries   Recent Labs Lab 01/13/17 0736  NA 126*  K 5.5*  CL 89*  CO2 25  GLUCOSE 183*  BUN 110*  CREATININE 7.61*  CALCIUM 8.0*  MG 2.1  AST 13*  ALT 12*  ALKPHOS 42  BILITOT 0.9   ------------------------------------------------------------------------------------------------------------------  Cardiac Enzymes No results for input(s): TROPONINI in the last 168 hours. ------------------------------------------------------------------------------------------------------------------  RADIOLOGY:  Dg Chest 2 View  Result Date: 01/13/2017 CLINICAL DATA:  Recurrent right pleural effusion. Atrial fibrillation. End-stage renal disease on dialysis. Chest tube placement. EXAM: CHEST  2 VIEW COMPARISON:  01/07/2017 FINDINGS: Right chest tube remains in place. Small right pleural effusion has decreased in size since previous study. No pneumothorax visualized. Decrease in bibasilar atelectasis also demonstrated since prior exam. Mild cardiomegaly remains stable. Aortic atherosclerosis. IMPRESSION: Decreased size of small right pleural effusion and bibasilar atelectasis. No pneumothorax visualized. Electronically Signed    By: Earle Gell M.D.   On: 01/13/2017 07:17    EKG:   Orders placed or performed during the hospital encounter of  12/30/16  . ED EKG within 10 minutes  . ED EKG within 10 minutes    ASSESSMENT AND PLAN:   76 year old male with past medical history significant for end-stage renal disease on hemodialysis, congestive heart failure, COPD on 2L home oxygen, diabetes and hypertension presents to hospital secondary to worsening dyspnea.  # Uremic encephalopathy Discussed with Dr. Juleen China and HD to be done urgently.  # Recurrent right pleural effusion - Talc pleurodesis done. -Empirically was on Levaquin for right lower lobe infiltrate.  - Required Bipap post-procedure and now on Penelope 5 L/min CXR today shows improvement. Discussed with Dr. Genevive Bi and chest tube will be removed today.  # Paroxysmal atrial fibrillation with rapid ventricular response Was on amiodarone drip and transitioned to oral amiodarone. -Was not on anticoagulation as outpatient due to GI bleed.   # Acute on Chronic respiratory failure with hypoxia due to pleural effusion  # Acute on chronic combined systolic and diastolic congestive heart failure Improved. On oral Lasix.  #diabetes mellitus-continue Levemir and sliding scale insulin. Also on aspart insulin prior to meals.  # end-stage renal disease on hemodialysis-appreciate nephrology input.  On Monday Wednesday Friday dialysis.   # COPD-stable at this time. On 3-6L home oxygen -Continue inhalers. Continue outpatient pulmonary follow-up - Due to respiratory failure post procedure, Was On Bipap- monitored in step down unit.  #  CAD-with chronic stable angina. Status post cardiac catheterization in May 2018 with diffuse RCA disease and collaterals LAD, left circumflex 75% stenosis. Troponins were elevated on admission. Cardiology recommended medical management. -Continue Imdur  # DVT prophylaxis-on heparin  All the records are reviewed and case discussed  with Care Management/Social Workerr. Management plans discussed with the patient, family and they are in agreement.  CODE STATUS: Full code  TOTAL TIME TAKING CARE OF THIS PATIENT: 35 minutes.   POSSIBLE D/C IN 2 DAYS, DEPENDING ON CLINICAL CONDITION.  Hillary Bow R M.D on 01/13/2017 at 11:43 AM  Between 7am to 6pm - Pager - [phone]  After 6pm go to [email or web address] - password [email or web address] [facility]  Office  [phone]  CC: Primary care physician; [name], MD

## 2017-01-13 NOTE — Progress Notes (Signed)
[facility]  ROUNDING NOTE   Subjective:   Seen and examined on hemodialysis. Tolerating treatment well. UF goal of 3 liters.   Mixed acidosis on ABG this morning. Patient with tonic movements and confused. Currently resting comfortably.   Objective:  Vital signs in last 24 hours:  Temp:  [97.5 F (36.4 C)-98.2 F (36.8 C)] 97.6 F (36.4 C) (10/15 1048) Pulse Rate:  [63-69] 64 (10/15 1245) Resp:  [4-24] 13 (10/15 1245) BP: (96-152)/(35-99) 138/41 (10/15 1245) SpO2:  [77 %-100 %] 100 % (10/15 1245) FiO2 (%):  [98 %] 98 % (10/15 1048) Weight:  [100.4 kg (221 lb 5.5 oz)] 100.4 kg (221 lb 5.5 oz) (10/15 1048)  Weight change:  Filed Weights   01/10/17 1230 01/11/17 0500 01/13/17 1048  Weight: 104.1 kg (229 lb 8 oz) 100.4 kg (221 lb 5.5 oz) 100.4 kg (221 lb 5.5 oz)    Intake/Output: I/O last 3 completed shifts: In: 1060 [P.O.:1060] Out: 190 [Chest Tube:190]   Intake/Output this shift:  No intake/output data recorded.  Physical Exam: General: NAD, laying in bed  Head: Normocephalic, atraumatic. Moist oral mucosal membranes  Eyes: Anicteric, PERRL  Neck: Supple, trachea midline  Lungs:  Clear to auscultation, Right chest tube  Heart: Regular rate and rhythm  Abdomen:  Soft, nontender,   Extremities: 1+ peripheral edema.  Neurologic: Nonfocal, moving all four extremities  Skin: No lesions  Access: Left arm AVF    Basic Metabolic Panel:  Recent Labs Lab 01/06/17 1830 01/07/17 0537 01/07/17 0927 01/08/17 1033 01/09/17 1540 01/10/17 0009 01/13/17 0736  NA 133* 135  --  132*  --   --  126*  K 5.0 4.8  --  4.2  --   --  5.5*  CL 94* 98*  --  92*  --   --  89*  CO2 25 26  --  27  --   --  25  GLUCOSE 185* 155*  --  297*  --   --  183*  BUN 57* 42*  --  48*  --   --  110*  CREATININE 6.61* 5.49*  --  5.41*  --   --  7.61*  CALCIUM 8.6* 8.2*  --  8.2*  --   --  8.0*  MG  --   --   --   --   --   --  2.1  PHOS  --   --  5.6*  --  6.1* 5.9*  --      Liver Function Tests:  Recent Labs Lab 01/13/17 0736  AST 13*  ALT 12*  ALKPHOS 42  BILITOT 0.9  PROT 5.3*  ALBUMIN 2.5*   No results for input(s): LIPASE, AMYLASE in the last 168 hours. No results for input(s): AMMONIA in the last 168 hours.  CBC:  Recent Labs Lab 01/06/17 1830 01/07/17 0537 01/10/17 0009 01/13/17 0736  WBC 10.1 7.9 8.1 12.0*  NEUTROABS  --  6.3  --   --   HGB 10.6* 9.5* 10.9* 10.3*  HCT 31.8* 28.3* 32.6* 31.3*  MCV 88.7 89.0 88.7 89.2  PLT 137* 131* 177 166    Cardiac Enzymes: No results for input(s): CKTOTAL, CKMB, CKMBINDEX, TROPONINI in the last 168 hours.  BNP: Invalid input(s): POCBNP  CBG:  Recent Labs Lab 01/12/17 1136 01/12/17 1658 01/12/17 2109 01/13/17 0323 01/13/17 0741  GLUCAP 184* 82 151* 200* 189*    Microbiology: Results for orders placed or performed during the hospital encounter of 12/30/16  Blood Culture (routine x 2)     Status: None   Collection Time: 12/30/16  4:28 PM  Result Value Ref Range Status   Specimen Description BLOOD RIGHT ANTECUBITAL  Final   Special Requests   Final    BOTTLES DRAWN AEROBIC AND ANAEROBIC Blood Culture adequate volume   Culture NO GROWTH 5 DAYS  Final   Report Status 01/04/2017 FINAL  Final  Blood Culture (routine x 2)     Status: None   Collection Time: 12/30/16  4:33 PM  Result Value Ref Range Status   Specimen Description BLOOD BLOOD RIGHT HAND  Final   Special Requests   Final    BOTTLES DRAWN AEROBIC AND ANAEROBIC Blood Culture adequate volume   Culture NO GROWTH 5 DAYS  Final   Report Status 01/04/2017 FINAL  Final  MRSA PCR Screening     Status: None   Collection Time: 12/31/16 12:50 AM  Result Value Ref Range Status   MRSA by PCR NEGATIVE NEGATIVE Final    Comment:        The GeneXpert MRSA Assay (FDA approved for NASAL specimens only), is one component of a comprehensive MRSA colonization surveillance program. It is not intended to diagnose MRSA infection  nor to guide or monitor treatment for MRSA infections.     Coagulation Studies: No results for input(s): LABPROT, INR in the last 72 hours.  Urinalysis: No results for input(s): COLORURINE, LABSPEC, PHURINE, GLUCOSEU, HGBUR, BILIRUBINUR, KETONESUR, PROTEINUR, UROBILINOGEN, NITRITE, LEUKOCYTESUR in the last 72 hours.  Invalid input(s): APPERANCEUR    Imaging: Dg Chest 2 View  Result Date: 01/13/2017 CLINICAL DATA:  Recurrent right pleural effusion. Atrial fibrillation. End-stage renal disease on dialysis. Chest tube placement. EXAM: CHEST  2 VIEW COMPARISON:  01/07/2017 FINDINGS: Right chest tube remains in place. Small right pleural effusion has decreased in size since previous study. No pneumothorax visualized. Decrease in bibasilar atelectasis also demonstrated since prior exam. Mild cardiomegaly remains stable. Aortic atherosclerosis. IMPRESSION: Decreased size of small right pleural effusion and bibasilar atelectasis. No pneumothorax visualized. Electronically Signed   By: Earle Gell M.D.   On: 01/13/2017 07:17     Medications:   . sodium chloride Stopped (01/06/17 2100)   . amiodarone  400 mg Oral Daily  . aspirin EC  81 mg Oral Daily  . atorvastatin  20 mg Oral QHS  . budesonide (PULMICORT) nebulizer solution  0.5 mg Nebulization BID  . calcium acetate  667 mg Oral TID WC  . carvedilol  6.25 mg Oral BID  . chlorpheniramine-HYDROcodone  5 mL Oral Q12H  . doxazosin  8 mg Oral QPM  . epoetin (EPOGEN/PROCRIT) injection  4,000 Units Intravenous Q M,W,F-HD  . feeding supplement (NEPRO CARB STEADY)  237 mL Oral BID BM  . fluticasone  1 spray Each Nare Daily  . furosemide  40 mg Oral BID  . guaiFENesin  600 mg Oral BID  . heparin subcutaneous  5,000 Units Subcutaneous Q8H  . insulin aspart  0-5 Units Subcutaneous QHS  . insulin aspart  0-9 Units Subcutaneous TID WC  . insulin aspart  6 Units Subcutaneous TID WC  . insulin detemir  33 Units Subcutaneous QHS  .  ipratropium-albuterol  3 mL Nebulization TID  . isosorbide mononitrate  30 mg Oral Daily  . multivitamin  1 tablet Oral QHS  . pantoprazole  40 mg Oral Daily  . polyethylene glycol  17 g Oral BID  . predniSONE  10 mg Oral Q breakfast  .  senna-docusate  1 tablet Oral BID  . sodium chloride flush  3 mL Intravenous Q12H  . spironolactone  25 mg Oral BID   sodium chloride, acetaminophen **OR** acetaminophen, benzonatate, bisacodyl, fentaNYL (SUBLIMAZE) injection, guaiFENesin-dextromethorphan, HYDROcodone-acetaminophen, ipratropium-albuterol, lidocaine-prilocaine, morphine injection, ondansetron (ZOFRAN) IV, phenylephrine, polyethylene glycol, senna-docusate, sodium chloride, sodium chloride flush, traMADol  Assessment/ Plan:  [name] is a 76 y.o. white male with end stage renal disease on hemodialysis,  diabetes mellitus type II insulin dependent, hypertension, coronary artery disease, hyperlipidemia, prostate cancer status post prostatectomy, COPD  Right chest tube placed on 10/8 by Dr. Genevive Bi.   MWF [facility] [place] AVF  1. End Stage Renal Disease MWF: seen and examined on hemodialysis. Concerning for elevated BUN and acidosis. Edema on examination Evaluate daily for dialysis need.   2. Anemia of chronic kidney disease. - outpatient mircera - EPO with HD treatment  3. 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   4. Secondary Hyperparathyroidism:  - continue calcium acetate with meals.    LOS: 14 Kushal Saunders 10/15/20181:50 PM

## 2017-01-13 NOTE — Progress Notes (Signed)
Chest tube has been present for one week.  He states he feels about the same overall.  This morning I reviewed his Intake/output and there was 200 cc recorded for Sunday and only 70 for yesterday so I removed his chest tube.  The site is clean and dry.  There were no complications during removal.  Will need to see in the office in one week.  [employer].

## 2017-01-14 LAB — GLUCOSE, CAPILLARY
GLUCOSE-CAPILLARY: 117 mg/dL — AB (ref 65–99)
GLUCOSE-CAPILLARY: 256 mg/dL — AB (ref 65–99)
Glucose-Capillary: 272 mg/dL — ABNORMAL HIGH (ref 65–99)
Glucose-Capillary: 182 mg/dL — ABNORMAL HIGH (ref 65–99)

## 2017-01-14 LAB — BASIC METABOLIC PANEL
ANION GAP: 8 (ref 5–15)
BUN: 75 mg/dL — AB (ref 6–20)
CALCIUM: 7.8 mg/dL — AB (ref 8.9–10.3)
CHLORIDE: 93 mmol/L — AB (ref 101–111)
CO2: 28 mmol/L (ref 22–32)
CREATININE: 5.9 mg/dL — AB (ref 0.61–1.24)
GFR calc non Af Amer: 8 mL/min — ABNORMAL LOW (ref >60)
GFR, EST AFRICAN AMERICAN: 9 mL/min — AB (ref >60)
GLUCOSE: 309 mg/dL — AB (ref 65–99)
POTASSIUM: 5 mmol/L (ref 3.5–5.1)
SODIUM: 129 mmol/L — AB (ref 135–145)

## 2017-01-14 MED ORDER — HEPARIN SODIUM (PORCINE) 1000 UNIT/ML DIALYSIS
50.0000 [IU]/kg | INTRAMUSCULAR | Status: DC | PRN
  Filled 2017-01-14: qty 5

## 2017-01-14 MED ORDER — HEPARIN SODIUM (PORCINE) 5000 UNIT/ML IJ SOLN
5000.0000 [IU] | Freq: Three times a day (TID) | INTRAMUSCULAR | Status: DC
  Administered 2017-01-14 – 2017-01-16 (×4): 5000 [IU] via SUBCUTANEOUS
  Filled 2017-01-14 (×4): qty 1

## 2017-01-14 MED ORDER — INSULIN DETEMIR 100 UNIT/ML ~~LOC~~ SOLN
35.0000 [IU] | Freq: Every day | SUBCUTANEOUS | Status: DC
  Administered 2017-01-14 – 2017-01-15 (×2): 35 [IU] via SUBCUTANEOUS
  Filled 2017-01-14 (×4): qty 0.35

## 2017-01-14 NOTE — Care Management Important Message (Signed)
Important Message  Patient Details  Name: Osric Klopf MRN: [id number] Date of Birth: Feb 19, 1941   Medicare Important Message Given:  Yes    Beverly Sessions, RN 01/14/2017, 6:09 PM

## 2017-01-14 NOTE — Progress Notes (Signed)
[facility] [place] NAME: Calvin Good    MR#:  [id number]  DATE OF BIRTH:  Oct 08, 1940  SUBJECTIVE:  CHIEF COMPLAINT:   Chief Complaint  Patient presents with  . Chest Pain  - s/p VATs and pleurodesis.  chest tube in place  Feels weak. Jerks have improved  REVIEW OF SYSTEMS:  Review of Systems  Constitutional: Positive for malaise/fatigue. Negative for chills and fever.  HENT: Positive for congestion. Negative for ear discharge, hearing loss and nosebleeds.   Eyes: Negative for blurred vision and double vision.  Respiratory: Positive for cough and shortness of breath. Negative for wheezing.   Cardiovascular: Positive for orthopnea. Negative for chest pain and palpitations.  Gastrointestinal: Negative for abdominal pain, constipation, diarrhea, nausea and vomiting.  Genitourinary: Negative for dysuria.  Musculoskeletal: Negative for myalgias.  Neurological: Negative for dizziness, speech change, focal weakness, seizures and headaches.  Psychiatric/Behavioral: Negative for depression.    DRUG ALLERGIES:   Allergies  Allergen Reactions  . Tetracyclines & Related Other (See Comments)    Reaction: Unknown     VITALS:  Blood pressure (!) 158/51, pulse 75, temperature 98.4 F (36.9 C), temperature source Oral, resp. rate 18, height 5\' 8"  (1.727 m), weight 102.2 kg (225 lb 5 oz), SpO2 93 %.  PHYSICAL EXAMINATION:  Physical Exam  GENERAL:  76 y.o.-year-old patient lying in the bed, Appears short of breath with minimal exertion EYES: Pupils equal, round, reactive to light and accommodation. No scleral icterus. Extraocular muscles intact.  HEENT: Head atraumatic, normocephalic. Oropharynx and nasopharynx clear.  NECK:  Supple, no jugular venous distention. No thyroid enlargement, no tenderness.  LUNGS: Normal breath sounds bilaterally, no wheezing, have some crepitation. No use of accessory muscles of respiration. Decreased  bibasilar breath sounds, chest tube in place, On nasal canula oxygen today. CARDIOVASCULAR: S1, S2 normal. No  rubs, or gallops. 2/6 systolic murmur is present ABDOMEN: Soft, nontender, nondistended. Bowel sounds present. No organomegaly or mass.  EXTREMITIES: No pedal edema, cyanosis, or clubbing. Left arm AV fistula present NEUROLOGIC: Cranial nerves II through XII are intact. Muscle strength 3-4/5 in all extremities. Sensation intact. Gait not checked.  Myoclonic jerks PSYCHIATRIC: The patient is alert and awake SKIN: No obvious rash, lesion, or ulcer.    LABORATORY PANEL:   CBC  Recent Labs Lab 01/13/17 0736  WBC 12.0*  HGB 10.3*  HCT 31.3*  PLT 166   ------------------------------------------------------------------------------------------------------------------  Chemistries   Recent Labs Lab 01/13/17 0736 01/14/17 0354  NA 126* 129*  K 5.5* 5.0  CL 89* 93*  CO2 25 28  GLUCOSE 183* 309*  BUN 110* 75*  CREATININE 7.61* 5.90*  CALCIUM 8.0* 7.8*  MG 2.1  --   AST 13*  --   ALT 12*  --   ALKPHOS 42  --   BILITOT 0.9  --    ------------------------------------------------------------------------------------------------------------------  Cardiac Enzymes No results for input(s): TROPONINI in the last 168 hours. ------------------------------------------------------------------------------------------------------------------  RADIOLOGY:  Dg Chest 2 View  Result Date: 01/13/2017 CLINICAL DATA:  Recurrent right pleural effusion. Atrial fibrillation. End-stage renal disease on dialysis. Chest tube placement. EXAM: CHEST  2 VIEW COMPARISON:  01/07/2017 FINDINGS: Right chest tube remains in place. Small right pleural effusion has decreased in size since previous study. No pneumothorax visualized. Decrease in bibasilar atelectasis also demonstrated since prior exam. Mild cardiomegaly remains stable. Aortic atherosclerosis. IMPRESSION: Decreased size of small right  pleural effusion and bibasilar atelectasis. No pneumothorax visualized. Electronically Signed  By: Earle Gell M.D.   On: 01/13/2017 07:17    EKG:   Orders placed or performed during the hospital encounter of 12/30/16  . ED EKG within 10 minutes  . ED EKG within 10 minutes    ASSESSMENT AND PLAN:   76 year old male with past medical history significant for end-stage renal disease on hemodialysis, congestive heart failure, COPD on 2L home oxygen, diabetes and hypertension presents to hospital secondary to worsening dyspnea.  # Uremic encephalopathy Discussed with Dr. Juleen China. HD again today  # Recurrent right pleural effusion - [place] pleurodesis done. -Empirically was on Levaquin for right lower lobe infiltrate.  - Required Bipap post-procedure and now on [place] 5 L/min CXR today shows improvement.  Chest tube removed  # Paroxysmal atrial fibrillation with rapid ventricular response Was on amiodarone drip and transitioned to oral amiodarone. - Not on anticoagulation as outpatient due to GI bleed.   # Acute on Chronic respiratory failure with hypoxia due to pleural effusion  # Acute on chronic combined systolic and diastolic congestive heart failure Improved. On oral Lasix.  #diabetes mellitus-continue Levemir and sliding scale insulin. Also on aspart insulin prior to meals.  # COPD-stable at this time. On 3-6L home oxygen -Continue inhalers. Continue outpatient pulmonary follow-up - Due to respiratory failure post procedure, Was On Bipap- monitored in step down unit.  #  CAD-with chronic stable angina. Status post cardiac catheterization in May 2018 with diffuse RCA disease and collaterals LAD, left circumflex 75% stenosis. Troponins were elevated on admission. Cardiology recommended medical management. -Continue Imdur  # DVT prophylaxis-on heparin  All the records are reviewed and case discussed with Care Management/Social Workerr. Management plans discussed with the  patient, family and they are in agreement.  CODE STATUS: DNR  TOTAL TIME TAKING CARE OF THIS PATIENT: 35 minutes.   POSSIBLE D/C IN 1-2 DAYS, DEPENDING ON CLINICAL CONDITION.  Hillary Bow R M.D on 01/14/2017 at 2:02 PM  Between 7am to 6pm - Pager - [phone]  After 6pm go to [email or web address] - password [email or web address]  Office  [phone]  CC: Primary care physician; [name], MD

## 2017-01-14 NOTE — Progress Notes (Signed)
Post HD  

## 2017-01-14 NOTE — Progress Notes (Signed)
Pre HD  

## 2017-01-14 NOTE — Progress Notes (Signed)
HD assessment completed at 0930

## 2017-01-14 NOTE — Progress Notes (Signed)
Tx initiated 

## 2017-01-14 NOTE — Progress Notes (Signed)
[facility]  ROUNDING NOTE   Subjective:   Extra hemodialysis treatment today. BUN and potassium elevated. Still with uremic symptoms.   Chest tube removed.   Hemodialysis treatment yesterday. Tolerated treatment well.     HEMODIALYSIS FLOWSHEET:  Blood Flow Rate (mL/min): 400 mL/min Arterial Pressure (mmHg): -160 mmHg Venous Pressure (mmHg): 140 mmHg Transmembrane Pressure (mmHg): 70 mmHg Ultrafiltration Rate (mL/min): 500 mL/min Dialysate Flow Rate (mL/min): 600 ml/min Conductivity: Machine : 13.9 Conductivity: Machine : 13.9 Dialysis Fluid Bolus: Normal Saline Bolus Amount (mL): 250 mL Dialysate Change: 2K    Objective:  Vital signs in last 24 hours:  Temp:  [97.5 F (36.4 C)-98.8 F (37.1 C)] 98.8 F (37.1 C) (10/16 0930) Pulse Rate:  [62-75] 67 (10/16 1145) Resp:  [8-24] 8 (10/16 1115) BP: (122-164)/(31-76) 154/43 (10/16 1145) SpO2:  [92 %-100 %] 99 % (10/16 1145) Weight:  [104.5 kg (230 lb 6.4 oz)] 104.5 kg (230 lb 6.4 oz) (10/16 0930)  Weight change:  Filed Weights   01/11/17 0500 01/13/17 1048 01/14/17 0930  Weight: 100.4 kg (221 lb 5.5 oz) 100.4 kg (221 lb 5.5 oz) 104.5 kg (230 lb 6.4 oz)    Intake/Output: I/O last 3 completed shifts: In: 60 [P.O.:516; I.V.:3] Out: 2645 [Other:2300; Chest Tube:345]   Intake/Output this shift:  No intake/output data recorded.  Physical Exam: General: NAD, laying in bed  Head: Normocephalic, atraumatic. Moist oral mucosal membranes  Eyes: Anicteric, PERRL  Neck: Supple, trachea midline  Lungs:  Clear to auscultation   Heart: Regular rate and rhythm  Abdomen:  Soft, nontender,   Extremities: no peripheral edema.  Neurologic: Nonfocal, moving all four extremities  Skin: No lesions  Access: Left arm AVF    Basic Metabolic Panel:  Recent Labs Lab 01/08/17 1033 01/09/17 1540 01/10/17 0009 01/13/17 0736 01/14/17 0354  NA 132*  --   --  126* 129*  K 4.2  --   --  5.5* 5.0  CL 92*  --   --   89* 93*  CO2 27  --   --  25 28  GLUCOSE 297*  --   --  183* 309*  BUN 48*  --   --  110* 75*  CREATININE 5.41*  --   --  7.61* 5.90*  CALCIUM 8.2*  --   --  8.0* 7.8*  MG  --   --   --  2.1  --   PHOS  --  6.1* 5.9*  --   --     Liver Function Tests:  Recent Labs Lab 01/13/17 0736  AST 13*  ALT 12*  ALKPHOS 42  BILITOT 0.9  PROT 5.3*  ALBUMIN 2.5*   No results for input(s): LIPASE, AMYLASE in the last 168 hours. No results for input(s): AMMONIA in the last 168 hours.  CBC:  Recent Labs Lab 01/10/17 0009 01/13/17 0736  WBC 8.1 12.0*  HGB 10.9* 10.3*  HCT 32.6* 31.3*  MCV 88.7 89.2  PLT 177 166    Cardiac Enzymes: No results for input(s): CKTOTAL, CKMB, CKMBINDEX, TROPONINI in the last 168 hours.  BNP: Invalid input(s): POCBNP  CBG:  Recent Labs Lab 01/13/17 0741 01/13/17 1501 01/13/17 1646 01/13/17 2245 01/14/17 0738  GLUCAP 189* 114* 139* 238* 272*    Microbiology: Results for orders placed or performed during the hospital encounter of 12/30/16  Blood Culture (routine x 2)     Status: None   Collection Time: 12/30/16  4:28 PM  Result Value Ref Range  Status   Specimen Description BLOOD RIGHT ANTECUBITAL  Final   Special Requests   Final    BOTTLES DRAWN AEROBIC AND ANAEROBIC Blood Culture adequate volume   Culture NO GROWTH 5 DAYS  Final   Report Status 01/04/2017 FINAL  Final  Blood Culture (routine x 2)     Status: None   Collection Time: 12/30/16  4:33 PM  Result Value Ref Range Status   Specimen Description BLOOD BLOOD RIGHT HAND  Final   Special Requests   Final    BOTTLES DRAWN AEROBIC AND ANAEROBIC Blood Culture adequate volume   Culture NO GROWTH 5 DAYS  Final   Report Status 01/04/2017 FINAL  Final  MRSA PCR Screening     Status: None   Collection Time: 12/31/16 12:50 AM  Result Value Ref Range Status   MRSA by PCR NEGATIVE NEGATIVE Final    Comment:        The GeneXpert MRSA Assay (FDA approved for NASAL specimens only), is  one component of a comprehensive MRSA colonization surveillance program. It is not intended to diagnose MRSA infection nor to guide or monitor treatment for MRSA infections.     Coagulation Studies: No results for input(s): LABPROT, INR in the last 72 hours.  Urinalysis: No results for input(s): COLORURINE, LABSPEC, PHURINE, GLUCOSEU, HGBUR, BILIRUBINUR, KETONESUR, PROTEINUR, UROBILINOGEN, NITRITE, LEUKOCYTESUR in the last 72 hours.  Invalid input(s): APPERANCEUR    Imaging: Dg Chest 2 View  Result Date: 01/13/2017 CLINICAL DATA:  Recurrent right pleural effusion. Atrial fibrillation. End-stage renal disease on dialysis. Chest tube placement. EXAM: CHEST  2 VIEW COMPARISON:  01/07/2017 FINDINGS: Right chest tube remains in place. Small right pleural effusion has decreased in size since previous study. No pneumothorax visualized. Decrease in bibasilar atelectasis also demonstrated since prior exam. Mild cardiomegaly remains stable. Aortic atherosclerosis. IMPRESSION: Decreased size of small right pleural effusion and bibasilar atelectasis. No pneumothorax visualized. Electronically Signed   By: Earle Gell M.D.   On: 01/13/2017 07:17     Medications:   . sodium chloride Stopped (01/06/17 2100)   . amiodarone  400 mg Oral Daily  . aspirin EC  81 mg Oral Daily  . atorvastatin  20 mg Oral QHS  . budesonide (PULMICORT) nebulizer solution  0.5 mg Nebulization BID  . calcium acetate  667 mg Oral TID WC  . carvedilol  6.25 mg Oral BID  . doxazosin  8 mg Oral QPM  . epoetin (EPOGEN/PROCRIT) injection  4,000 Units Intravenous Q M,W,F-HD  . feeding supplement (NEPRO CARB STEADY)  237 mL Oral BID BM  . fluticasone  1 spray Each Nare Daily  . furosemide  40 mg Oral BID  . guaiFENesin  600 mg Oral BID  . heparin subcutaneous  5,000 Units Subcutaneous Q8H  . insulin aspart  0-5 Units Subcutaneous QHS  . insulin aspart  0-9 Units Subcutaneous TID WC  . insulin aspart  6 Units  Subcutaneous TID WC  . insulin detemir  33 Units Subcutaneous QHS  . ipratropium-albuterol  3 mL Nebulization TID  . isosorbide mononitrate  30 mg Oral Daily  . multivitamin  1 tablet Oral QHS  . pantoprazole  40 mg Oral Daily  . senna-docusate  1 tablet Oral BID  . sodium chloride flush  3 mL Intravenous Q12H  . spironolactone  25 mg Oral BID   sodium chloride, acetaminophen **OR** acetaminophen, benzonatate, bisacodyl, fentaNYL (SUBLIMAZE) injection, guaiFENesin-dextromethorphan, heparin, ipratropium-albuterol, lidocaine-prilocaine, morphine injection, ondansetron (ZOFRAN) IV, phenylephrine, polyethylene glycol, senna-docusate, sodium  chloride, sodium chloride flush, traMADol  Assessment/ Plan:  Calvin Good is a 76 y.o. white male with end stage renal disease on hemodialysis,  diabetes mellitus type II insulin dependent, hypertension, coronary artery disease, hyperlipidemia, prostate cancer status post prostatectomy, COPD  Right chest tube placed on 10/8 by Dr. Genevive Bi.   MWF [facility] AVF  1. End Stage Renal Disease MWF: seen and examined on hemodialysis. Extra treatment for uremic symptoms and hyperkalemia - 2 K bath - Evaluate daily for dialysis need.  - Dialysis for tomorrow.   2. Anemia of chronic kidney disease. - outpatient mircera  3. 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   4. Secondary Hyperparathyroidism:  - continue calcium acetate with meals.    LOS: [place], [place] 10/16/201812:08 PM

## 2017-01-14 NOTE — Progress Notes (Signed)
Post HD assessment  

## 2017-01-14 NOTE — Progress Notes (Signed)
Tx complete  

## 2017-01-14 NOTE — Progress Notes (Signed)
Inpatient Diabetes Program Recommendations  AACE/ADA: New Consensus Statement on Inpatient Glycemic Control (2015)  Target Ranges:  Prepandial:   less than 140 mg/dL      [facility] postprandial:   less than 180 mg/dL (1-2 hours)      Critically ill patients:  140 - 180 mg/dL   Lab Results  Component Value Date   GLUCAP 272 (H) 01/14/2017   HGBA1C 8.4 (H) 11/25/2016    Review of Glycemic Control  Results for RIGGS, DINEEN (MRN [id number]) as of 01/14/2017 09:00  Ref. Range 01/13/2017 07:41 01/13/2017 15:01 01/13/2017 16:46 01/13/2017 22:45 01/14/2017 07:38  Glucose-Capillary Latest Ref Range: 65 - 99 mg/dL 189 (H) 114 (H) 139 (H) 238 (H) 272 (H)   Diabetes history:Type 2 Outpatient Diabetes medications: Levemir 30 units QHS, Humalog 10 units tid with meals  Current orders for Inpatient glycemic control: Levemir 33 units QHS, Novolog Sensitive Correction 0-9 units tid + Novolog HS scale 0-5 units + Novolog 6 units tid meal coverage  Inpatient Diabetes Program Recommendations: Elevated fasting blood sugar today but steroids completed yesterday. Agree with current medications for blood sugar management.   If fasting blood sugar elevated tomorrow, consider increasing Levemir to 35 units qhs.  Gentry Fitz, RN, BA, MHA, CDE Diabetes Coordinator Inpatient Diabetes Program  [phone] (Team Pager) [phone] ([place]) 01/14/2017 9:03 AM

## 2017-01-15 LAB — CBC
HCT: 27.8 % — ABNORMAL LOW (ref 40.0–52.0)
Hemoglobin: 8.9 g/dL — ABNORMAL LOW (ref 13.0–18.0)
MCH: 28.1 pg (ref 26.0–34.0)
MCHC: 32 g/dL (ref 32.0–36.0)
MCV: 87.9 fL (ref 80.0–100.0)
PLATELETS: 155 10*3/uL (ref 150–440)
RBC: 3.16 MIL/uL — ABNORMAL LOW (ref 4.40–5.90)
RDW: 15.1 % — AB (ref 11.5–14.5)
WBC: 11.4 10*3/uL — AB (ref 3.8–10.6)

## 2017-01-15 LAB — GLUCOSE, CAPILLARY
Glucose-Capillary: 126 mg/dL — ABNORMAL HIGH (ref 65–99)
Glucose-Capillary: 179 mg/dL — ABNORMAL HIGH (ref 65–99)
Glucose-Capillary: 170 mg/dL — ABNORMAL HIGH (ref 65–99)

## 2017-01-15 LAB — RENAL FUNCTION PANEL
Albumin: 2.3 g/dL — ABNORMAL LOW (ref 3.5–5.0)
Anion gap: 10 (ref 5–15)
BUN: 56 mg/dL — AB (ref 6–20)
CHLORIDE: 93 mmol/L — AB (ref 101–111)
CO2: 27 mmol/L (ref 22–32)
CREATININE: 4.82 mg/dL — AB (ref 0.61–1.24)
Calcium: 7.6 mg/dL — ABNORMAL LOW (ref 8.9–10.3)
GFR calc Af Amer: 12 mL/min — ABNORMAL LOW (ref >60)
GFR, EST NON AFRICAN AMERICAN: 10 mL/min — AB (ref >60)
GLUCOSE: 171 mg/dL — AB (ref 65–99)
Phosphorus: 3.9 mg/dL (ref 2.5–4.6)
Potassium: 4.2 mmol/L (ref 3.5–5.1)
Sodium: 130 mmol/L — ABNORMAL LOW (ref 135–145)

## 2017-01-15 MED ORDER — DIPHENHYDRAMINE HCL 25 MG PO CAPS
25.0000 mg | ORAL_CAPSULE | Freq: Every evening | ORAL | Status: DC | PRN
  Administered 2017-01-15: 25 mg via ORAL
  Filled 2017-01-15: qty 1

## 2017-01-15 MED ORDER — EPOETIN ALFA 10000 UNIT/ML IJ SOLN
10000.0000 [IU] | INTRAMUSCULAR | Status: DC
  Administered 2017-01-15: 10000 [IU] via INTRAVENOUS

## 2017-01-15 MED ORDER — AMIODARONE HCL 200 MG PO TABS: 200.0000 mg | ORAL_TABLET | Freq: Every day | ORAL | 0 refills | Status: DC

## 2017-01-15 MED ORDER — IPRATROPIUM-ALBUTEROL 0.5-2.5 (3) MG/3ML IN SOLN
3.0000 mL | Freq: Two times a day (BID) | RESPIRATORY_TRACT | Status: DC
  Administered 2017-01-15 – 2017-01-16 (×2): 3 mL via RESPIRATORY_TRACT
  Filled 2017-01-15 (×3): qty 3

## 2017-01-15 MED ORDER — HEPARIN SODIUM (PORCINE) 1000 UNIT/ML DIALYSIS
50.0000 [IU]/kg | INTRAMUSCULAR | Status: DC | PRN
  Filled 2017-01-15: qty 6

## 2017-01-15 NOTE — Progress Notes (Signed)
Post dialysis assessment 

## 2017-01-15 NOTE — Clinical Social Work Note (Signed)
Clinical Social Work Assessment  Patient Details  Name: Calvin Good MRN: [id number] Date of Birth: 07/18/1940  Date of referral:  01/15/17               Reason for consult:  Facility Placement                Permission sought to share information with:  Facility Sport and exercise psychologist, Family Supports Permission granted to share information::  Yes, Verbal Permission Granted  Name::        Agency::     Relationship::     Contact Information:     Housing/Transportation Living arrangements for the past 2 months:  Single Family Home Source of Information:  Adult Children, Patient Patient Interpreter Needed:  None Criminal Activity/Legal Involvement Pertinent to Current Situation/Hospitalization:  No - Comment as needed Significant Relationships:  Adult Children Lives with:  Self Do you feel safe going back to the place where you live?  Yes Need for family participation in patient care:  Yes (Comment)  Care giving concerns:  Patient resides at home alone.   Social Worker assessment / plan:  Patient was in dialysis but had been informed by patient's nurse that patient and daughter had agreed for STR. CSW began bed search and CSW met with patient's daughter this afternoon and extended the bed offers. She was hoping for [facility] but they did not offer. Patient's daughter was pleased about [facility] and accepted their offer. CSW has informed [name] at [facility] of bed acceptance.  Employment status:  Retired Forensic scientist:  Medicare PT Recommendations:  [place] / Referral to community resources:     Patient/Family's Response to care:  Patient's daughter expressed appreciation for CSW assistance.  Patient/Family's Understanding of and Emotional Response to Diagnosis, Current Treatment, and Prognosis:  Before going to dialysis, patient had informed nurse that he now wanted to go to STR. Patient's daughter is pleased with the offer from  [facility].  Emotional Assessment Appearance:  Appears stated age Attitude/Demeanor/Rapport:   (patient was in dialysis and CSW met with daughter) Affect (typically observed):  Accepting, Adaptable Orientation:  Oriented to Self, Oriented to Place, Oriented to Situation, Oriented to  Time Alcohol / Substance use:  Not Applicable Psych involvement (Current and /or in the community):  No (Comment)  Discharge Needs  Concerns to be addressed:  Care Coordination Readmission within the last 30 days:  No Current discharge risk:  None Barriers to Discharge:  No Barriers Identified   Shela Leff, LCSW 01/15/2017, 4:00 PM

## 2017-01-15 NOTE — NC FL2 (Signed)
[place] LEVEL OF CARE SCREENING TOOL     IDENTIFICATION  Patient Name: Calvin Good Birthdate: [birth year] Sex: male Admission Date (Current Location): 12/30/2016  [facility] and [place] Number:  Engineering geologist and Address:  [facility], [address], [place], [place] [address]      Provider Number: [id number]  Attending Physician Name and Address:  Hillary Bow, MD  Relative Name and Phone Number:       Current Level of Care: Hospital Recommended Level of Care: [place] Prior Approval Number:    Date Approved/Denied:   PASRR Number:    Discharge Plan: SNF    Current Diagnoses: Patient Active Problem List   Diagnosis Date Noted  . ESRD on dialysis ([place])   . Recurrent pleural effusion on right   . Atrial fibrillation ([place]) 12/30/2016  . Atrial fibrillation with RVR ([place]) 11/25/2016  . A-fib ([place]) 10/30/2016  . Allergic rhinitis due to pollen 10/29/2016  . Chronic airway obstruction ([place]) 10/29/2016  . Chronic fatigue syndrome 10/29/2016  . Dependence on renal dialysis ([place]) 10/29/2016  . Diabetic neuropathy ([place]) 10/29/2016  . Diabetic renal disease ([place]) 10/29/2016  . Heart failure ([place]) 10/29/2016  . Hyperglycemia due to type 2 diabetes mellitus ([place]) 10/29/2016  . Incontinence without sensory awareness 10/29/2016  . Long term current use of insulin ([place]) 10/29/2016  . Obstructive sleep apnea 10/29/2016  . Urinary incontinence 10/29/2016  . Acute respiratory failure ([place]) 10/16/2016  . Anxiety 10/10/2016  . Acute encephalopathy   . HCAP (healthcare-associated pneumonia) 09/14/2016  . Tobacco use 09/03/2016  . Aneurysm ([place]) 08/28/2016  . Pseudoaneurysm following procedure ([place]) 08/26/2016  . Chest tightness 08/20/2016  . GERD without esophagitis 08/20/2016  . NSTEMI (non-ST elevated myocardial infarction) ([place]) 08/20/2016  . Cancer of prostate ([place]) 05/16/2016  . Chronic diastolic CHF  (congestive heart failure) ([place]) 05/16/2016  . Diabetic polyneuropathy associated with type 2 diabetes mellitus ([place]) 05/16/2016  . Onychomycosis of toenail 05/13/2016  . Essential hypertension, benign 04/23/2016  . Hyperlipidemia 04/23/2016  . Type 2 diabetes mellitus with chronic kidney disease on chronic dialysis, with long-term current use of insulin ([place]) 04/23/2016  . ESRD (end stage renal disease) ([place]) 04/23/2016    Orientation RESPIRATION BLADDER Height & Weight     Self, Time, Situation, Place  Normal, O2 (6 liters) Continent Weight: 225 lb 8 oz (102.3 kg) Height:  5\' 8"  (172.7 cm)  BEHAVIORAL SYMPTOMS/MOOD NEUROLOGICAL BOWEL NUTRITION STATUS   (none)  (none) Continent Diet (renal diet)  AMBULATORY STATUS COMMUNICATION OF NEEDS Skin   Extensive Assist Verbally Normal                       Personal Care Assistance Level of Assistance  Bathing, Dressing Bathing Assistance: Limited assistance   Dressing Assistance: Limited assistance     Functional Limitations Info  Sight Sight Info: Impaired        SPECIAL CARE FACTORS FREQUENCY  PT (By licensed PT) (outpatient hemodialysis: [facility] [address] MWF)                    Contractures Contractures Info: Not present    Additional Factors Info  Code Status Code Status Info: dnr             Current Medications (01/15/2017):  This is the current hospital active medication list Current Facility-Administered Medications  Medication Dose Route Frequency Provider Last Rate Last Dose  . 0.9 %  sodium chloride infusion  250 mL Intravenous PRN Bettey Costa, MD   Stopped at 01/06/17 2100  . acetaminophen (TYLENOL) tablet 650 mg  650 mg Oral Q6H PRN Bettey Costa, MD   650 mg at 01/12/17 2252   Or  . acetaminophen (TYLENOL) suppository 650 mg  650 mg Rectal Q6H PRN Bettey Costa, MD      . amiodarone (PACERONE) tablet 400 mg  400 mg Oral Daily Corey Skains, MD   400 mg at 01/14/17 1334  . aspirin EC tablet 81  mg  81 mg Oral Daily Bettey Costa, MD   81 mg at 01/14/17 1335  . atorvastatin (LIPITOR) tablet 20 mg  20 mg Oral QHS Bettey Costa, MD   20 mg at 01/14/17 2121  . benzonatate (TESSALON) capsule 200 mg  200 mg Oral TID PRN Lance Coon, MD   200 mg at 01/12/17 2253  . bisacodyl (DULCOLAX) suppository 10 mg  10 mg Rectal Daily PRN Hillary Bow, MD      . budesonide (PULMICORT) nebulizer solution 0.5 mg  0.5 mg Nebulization BID Loletha Grayer, MD   0.5 mg at 01/15/17 0750  . calcium acetate (PHOSLO) capsule 667 mg  667 mg Oral TID WC Bettey Costa, MD   667 mg at 01/14/17 1715  . carvedilol (COREG) tablet 6.25 mg  6.25 mg Oral BID Bettey Costa, MD   6.25 mg at 01/14/17 2121  . doxazosin (CARDURA) tablet 8 mg  8 mg Oral QPM Mody, Sital, MD   8 mg at 01/14/17 1715  . epoetin alfa (EPOGEN,PROCRIT) injection 4,000 Units  4,000 Units Intravenous Q M,W,F-HD Murlean Iba, MD   4,000 Units at 01/10/17 1413  . feeding supplement (NEPRO CARB STEADY) liquid 237 mL  237 mL Oral BID BM Singh, Harmeet, MD   237 mL at 01/14/17 1340  . fentaNYL (SUBLIMAZE) injection 25 mcg  25 mcg Intravenous Q1H PRN Merlyn Lot, MD   25 mcg at 12/31/16 0801  . fluticasone (FLONASE) 50 MCG/ACT nasal spray 1 spray  1 spray Each Nare Daily Gladstone Lighter, MD   1 spray at 01/14/17 1338  . furosemide (LASIX) tablet 40 mg  40 mg Oral BID Bettey Costa, MD   40 mg at 01/14/17 1715  . guaiFENesin (MUCINEX) 12 hr tablet 600 mg  600 mg Oral BID Gladstone Lighter, MD   600 mg at 01/14/17 2120  . guaiFENesin-dextromethorphan (ROBITUSSIN DM) 100-10 MG/5ML syrup 5 mL  5 mL Oral Q4H PRN Sudini, Alveta Heimlich, MD   5 mL at 01/12/17 1542  . heparin injection 5,000 Units  5,000 Units Subcutaneous Q8H Sudini, Srikar, MD   5,000 Units at 01/15/17 0538  . heparin injection 5,100 Units  50 Units/kg Dialysis PRN Kolluru, Sarath, MD      . insulin aspart (novoLOG) injection 0-5 Units  0-5 Units Subcutaneous QHS Bettey Costa, MD   3 Units at 01/14/17 2119   . insulin aspart (novoLOG) injection 0-9 Units  0-9 Units Subcutaneous TID WC Bettey Costa, MD   2 Units at 01/14/17 1717  . insulin aspart (novoLOG) injection 6 Units  6 Units Subcutaneous TID WC Vaughan Basta, MD   6 Units at 01/14/17 1716  . insulin detemir (LEVEMIR) injection 35 Units  35 Units Subcutaneous QHS Hillary Bow, MD   35 Units at 01/14/17 2242  . ipratropium-albuterol (DUONEB) 0.5-2.5 (3) MG/3ML nebulizer solution 3 mL  3 mL Nebulization Q6H PRN Awilda Bill, NP   3 mL at 01/07/17 0420  .  ipratropium-albuterol (DUONEB) 0.5-2.5 (3) MG/3ML nebulizer solution 3 mL  3 mL Nebulization TID Vaughan Basta, MD   3 mL at 01/15/17 0750  . isosorbide mononitrate (IMDUR) 24 hr tablet 30 mg  30 mg Oral Daily Hillary Bow, MD   30 mg at 01/14/17 1335  . lidocaine-prilocaine (EMLA) cream   Topical PRN Wieting, Richard, MD      . morphine 2 MG/ML injection 1-2 mg  1-2 mg Intravenous Q4H PRN Sudini, Alveta Heimlich, MD      . multivitamin (RENA-VIT) tablet 1 tablet  1 tablet Oral QHS Vaughan Basta, MD   1 tablet at 01/14/17 2124  . ondansetron (ZOFRAN) injection 4 mg  4 mg Intravenous Q6H PRN Nestor Lewandowsky, MD      . pantoprazole (PROTONIX) EC tablet 40 mg  40 mg Oral Daily Bettey Costa, MD   40 mg at 01/14/17 1335  . phenylephrine (NEO-SYNEPHRINE) 0.25 % nasal spray 1 spray  1 spray Each Nare Q6H PRN Murlean Iba, MD   1 spray at 01/05/17 1516  . polyethylene glycol (MIRALAX / GLYCOLAX) packet 17 g  17 g Oral Daily PRN Vaughan Basta, MD      . senna-docusate (Senokot-S) tablet 1 tablet  1 tablet Oral QHS PRN Bettey Costa, MD      . senna-docusate (Senokot-S) tablet 1 tablet  1 tablet Oral BID Vaughan Basta, MD   1 tablet at 01/14/17 2121  . sodium chloride (OCEAN) 0.65 % nasal spray 2 spray  2 spray Each Nare Q6H PRN Loney Hering D, MD   2 spray at 01/04/17 2021  . sodium chloride flush (NS) 0.9 % injection 3 mL  3 mL Intravenous Q12H Bettey Costa, MD   3  mL at 01/14/17 2135  . sodium chloride flush (NS) 0.9 % injection 3 mL  3 mL Intravenous PRN Bettey Costa, MD      . spironolactone (ALDACTONE) tablet 25 mg  25 mg Oral BID Bettey Costa, MD   25 mg at 01/14/17 1715  . traMADol (ULTRAM) tablet 50 mg  50 mg Oral Q12H PRN Hillary Bow, MD         Discharge Medications: Please see discharge summary for a list of discharge medications.  Relevant Imaging Results:  Relevant Lab Results:   Additional Information ss: [id number]  Shela Leff, LCSW

## 2017-01-15 NOTE — Progress Notes (Signed)
[facility] [place] NAME: Calvin Good    MR#:  [id number]  DATE OF BIRTH:  03/29/1941  SUBJECTIVE:  CHIEF COMPLAINT:   Chief Complaint  Patient presents with  . Chest Pain  - s/p VATs and pleurodesis.  chest tube in place  Feels better. Has chronic SOB. HD later today  REVIEW OF SYSTEMS:  Review of Systems  Constitutional: Positive for malaise/fatigue. Negative for chills and fever.  HENT: Positive for congestion. Negative for ear discharge, hearing loss and nosebleeds.   Eyes: Negative for blurred vision and double vision.  Respiratory: Positive for cough and shortness of breath. Negative for wheezing.   Cardiovascular: Positive for orthopnea. Negative for chest pain and palpitations.  Gastrointestinal: Negative for abdominal pain, constipation, diarrhea, nausea and vomiting.  Genitourinary: Negative for dysuria.  Musculoskeletal: Negative for myalgias.  Neurological: Negative for dizziness, speech change, focal weakness, seizures and headaches.  Psychiatric/Behavioral: Negative for depression.    DRUG ALLERGIES:   Allergies  Allergen Reactions  . Tetracyclines & Related Other (See Comments)    Reaction: Unknown     VITALS:  Blood pressure (!) 163/43, pulse 63, temperature 98 F (36.7 C), temperature source Oral, resp. rate 14, height 5\' 8"  (1.727 m), weight 104 kg (229 lb 4.5 oz), SpO2 100 %.  PHYSICAL EXAMINATION:  Physical Exam  GENERAL:  76 y.o.-year-old patient lying in the bed, Appears short of breath with minimal exertion EYES: Pupils equal, round, reactive to light and accommodation. No scleral icterus. Extraocular muscles intact.  HEENT: Head atraumatic, normocephalic. Oropharynx and nasopharynx clear.  NECK:  Supple, no jugular venous distention. No thyroid enlargement, no tenderness.  LUNGS: Normal breath sounds bilaterally, no wheezing, have some crepitation. No use of accessory muscles of respiration.  Decreased bibasilar breath sounds, chest tube in place, On nasal canula oxygen today. CARDIOVASCULAR: S1, S2 normal. No  rubs, or gallops. 2/6 systolic murmur is present ABDOMEN: Soft, nontender, nondistended. Bowel sounds present. No organomegaly or mass.  EXTREMITIES: No pedal edema, cyanosis, or clubbing. Left arm AV fistula present NEUROLOGIC: Cranial nerves II through XII are intact. Muscle strength 3-4/5 in all extremities. Sensation intact. Gait not checked.  Myoclonic jerks PSYCHIATRIC: The patient is alert and awake SKIN: No obvious rash, lesion, or ulcer.    LABORATORY PANEL:   CBC  Recent Labs Lab 01/15/17 1000  WBC 11.4*  HGB 8.9*  HCT 27.8*  PLT 155   ------------------------------------------------------------------------------------------------------------------  Chemistries   Recent Labs Lab 01/13/17 0736  01/15/17 1000  NA 126*  < > 130*  K 5.5*  < > 4.2  CL 89*  < > 93*  CO2 25  < > 27  GLUCOSE 183*  < > 171*  BUN 110*  < > 56*  CREATININE 7.61*  < > 4.82*  CALCIUM 8.0*  < > 7.6*  MG 2.1  --   --   AST 13*  --   --   ALT 12*  --   --   ALKPHOS 42  --   --   BILITOT 0.9  --   --   < > = values in this interval not displayed. ------------------------------------------------------------------------------------------------------------------  Cardiac Enzymes No results for input(s): TROPONINI in the last 168 hours. ------------------------------------------------------------------------------------------------------------------  RADIOLOGY:  No results found.  EKG:   Orders placed or performed during the hospital encounter of 12/30/16  . ED EKG within 10 minutes  . ED EKG within 10 minutes    ASSESSMENT  AND PLAN:   76 year old male with past medical history significant for end-stage renal disease on hemodialysis, congestive heart failure, COPD on 2L home oxygen, diabetes and hypertension presents to hospital secondary to worsening  dyspnea.  # Uremic encephalopathy - resolved Discussed with Dr. Juleen China. HD today  # Recurrent right pleural effusion - Talc pleurodesis done. -Empirically was on Levaquin for right lower lobe infiltrate.  - Required Bipap post-procedure and now on [place] 5 L/min CXR today shows improvement.  Chest tube removed  # Paroxysmal atrial fibrillation with rapid ventricular response Was on amiodarone drip and transitioned to oral amiodarone. - Not on anticoagulation as outpatient due to GI bleed.   # Acute on Chronic respiratory failure with hypoxia due to pleural effusion  # Acute on chronic combined systolic and diastolic congestive heart failure Improved. On oral Lasix.  #diabetes mellitus Levemir, SSI  # COPD-stable at this time. On 3-6L home oxygen -Continue inhalers.  #  CAD-with chronic stable angina. Status post cardiac catheterization in May 2018 with diffuse RCA disease and collaterals LAD, left circumflex 75% stenosis. Troponins were elevated on admission. Cardiology recommended medical management.  # DVT prophylaxis-on heparin  All the records are reviewed and case discussed with Care Management/Social Workerr. Management plans discussed with the patient, family and they are in agreement.  CODE STATUS: DNR  TOTAL TIME TAKING CARE OF THIS PATIENT: 35 minutes.   D/C tomorrow  Hillary Bow R M.D on 01/15/2017 at 12:47 PM  Between 7am to 6pm - Pager - [phone]  After 6pm go to [email or web address] - password [email or web address] [place] [facility]  Office  [phone]  CC: Primary care physician; [name], MD

## 2017-01-15 NOTE — Progress Notes (Signed)
Physical Therapy Treatment Patient Details Name: Calvin Good MRN: [id number] DOB: 04-18-1940 Today's Date: 01/15/2017    History of Present Illness presented to ER from dialysis secondary to chest pain, elevated HR (130-150s); admitted with afib with RVR, ultimately requiring amioderone drip for control and conversion to NSR.  Now, status post bronchoscopy with R thoracoscopy, talc pleurodesis (10/8).  Currently on 5L supplemental O2 via [place]. Chest tube is now removed.     PT Comments    Pt continues to demonstrate significant weakness with bed mobility, transfers, and limited ambulation in room. He is able to ambulate with therapist and CNA from bed to commode and back to bed. Requires heavy cues for safety and sequencing with walker during ambulation and turns. Initially very unsteady but improves with increased distance. SaO2 drops to 88% on 4L/min O2 during ambulation but recovers with rest breaks. He will need SNF placement at discharge to facilitate Good transition back home. Pt will benefit from PT services to address deficits in strength, balance, and mobility in order to return to full function at home.    Follow Up Recommendations  SNF     Equipment Recommendations  Rolling walker with 5" wheels    Recommendations for Other Services       Precautions / Restrictions Precautions Precautions: Fall Precaution Comments: No BP L UE Restrictions Weight Bearing Restrictions: No    Mobility  Bed Mobility Overal bed mobility: Needs Assistance Bed Mobility: Supine to Sit     Supine to sit: Min assist     General bed mobility comments: Assist for LEs and trunk. cues for proper sequencing. Cues to pull up toward [facility]  Transfers Overall transfer level: Needs assistance Equipment used: Rolling walker (2 wheeled) Transfers: Sit to/from Stand Sit to Stand: Mod assist;+2 physical assistance         General transfer comment: Performed sit to stand from bed as well as commode.  Pt with heavy UE support and attempts to pulll up on rolling walker  Ambulation/Gait Ambulation/Gait assistance: Min assist Ambulation Distance (Feet): 40 Feet Assistive device: Rolling walker (2 wheeled) Gait Pattern/deviations: Step-through pattern Gait velocity: Decreased Gait velocity interpretation: <1.8 ft/sec, indicative of risk for recurrent falls General Gait Details: Pt able to ambulate with therapist and CNA from bed to commode and back to bed. Requires heavy cues for safety and sequencing with walker during ambulation and turns. Initially very unsteady but improves with increased distance. SaO2 drops to 88% on 4L/min O2 during ambulation   Stairs            Wheelchair Mobility    Modified Rankin (Stroke Patients Only)       Balance Overall balance assessment: Needs assistance Sitting-balance support: Bilateral upper extremity supported Sitting balance-Leahy Scale: Good     Standing balance support: Bilateral upper extremity supported Standing balance-Leahy Scale: Fair Standing balance comment: Initially poor but improves with increased time in standing                            Cognition Arousal/Alertness: Awake/alert Behavior During Therapy: Flat affect Overall Cognitive Status: Within Functional Limits for tasks assessed                                        Exercises      General Comments        Pertinent Vitals/Pain  Pain Assessment: No/denies pain    Home Living                      Prior Function            PT Goals (current goals can now be found in the care plan section) Acute Rehab PT Goals Patient Stated Goal: to get better PT Goal Formulation: With patient Time For Goal Achievement: 01/23/17 Potential to Achieve Goals: Good Progress towards PT goals: Progressing toward goals    Frequency    Min 2X/week      PT Plan Current plan remains appropriate    Co-evaluation               AM-PAC PT "6 Clicks" Daily Activity  Outcome Measure  Difficulty turning over in bed (including adjusting bedclothes, sheets and blankets)?: Unable Difficulty moving from lying on back to sitting on the side of the bed? : Unable Difficulty sitting down on and standing up from a chair with arms (e.g., wheelchair, bedside commode, etc,.)?: Unable Help needed moving to and from a bed to chair (including a wheelchair)?: A Little Help needed walking in hospital room?: A Little Help needed climbing 3-5 steps with a railing? : Total 6 Click Score: 10    End of Session Equipment Utilized During Treatment: Gait belt;Oxygen Activity Tolerance: Patient tolerated treatment well;Other (comment) Patient left: in bed;with call bell/phone within reach;with bed alarm set Nurse Communication: Mobility status PT Visit Diagnosis: Difficulty in walking, not elsewhere classified (R26.2);Muscle weakness (generalized) (M62.81)     Time: 2229-7989 PT Time Calculation (min) (ACUTE ONLY): 23 min  Charges:  $Therapeutic Activity: 23-37 mins                    G Codes:       Calvin Good Calvin Good PT, DPT     Eira Alpert 01/15/2017, 5:25 PM

## 2017-01-15 NOTE — Progress Notes (Signed)
[facility]  ROUNDING NOTE   Subjective:   Seen and examined on dialysis. Tolerating treatment well. UF of 2.5    HEMODIALYSIS FLOWSHEET:  Blood Flow Rate (mL/min): 400 mL/min Arterial Pressure (mmHg): -160 mmHg Venous Pressure (mmHg): 140 mmHg Transmembrane Pressure (mmHg): 70 mmHg Ultrafiltration Rate (mL/min): 750 mL/min Dialysate Flow Rate (mL/min): 70 ml/min Conductivity: Machine : 13.7 Conductivity: Machine : 13.7 Dialysis Fluid Bolus: Normal Saline Bolus Amount (mL): 250 mL Dialysate Change: 2K    Objective:  Vital signs in last 24 hours:  Temp:  [97.4 F (36.3 C)-98.4 F (36.9 C)] 98 F (36.7 C) (10/17 1000) Pulse Rate:  [63-75] 63 (10/17 1045) Resp:  [8-20] 14 (10/17 1045) BP: (132-175)/(31-101) 132/39 (10/17 1045) SpO2:  [93 %-100 %] 100 % (10/17 1000) Weight:  [102.2 kg (225 lb 5 oz)-104 kg (229 lb 4.5 oz)] 104 kg (229 lb 4.5 oz) (10/17 1000)  Weight change: 4.109 kg (9 lb 0.9 oz) Filed Weights   01/14/17 1300 01/15/17 0441 01/15/17 1000  Weight: 102.2 kg (225 lb 5 oz) 102.3 kg (225 lb 8 oz) 104 kg (229 lb 4.5 oz)    Intake/Output: I/O last 3 completed shifts: In: 479 [P.O.:476; I.V.:3] Out: 1700 [Other:1700]   Intake/Output this shift:  No intake/output data recorded.  Physical Exam: General: NAD, laying in bed  Head: Normocephalic, atraumatic. Moist oral mucosal membranes  Eyes: Anicteric, PERRL  Neck: Supple, trachea midline  Lungs:  Clear to auscultation   Heart: Regular rate and rhythm  Abdomen:  Soft, nontender,   Extremities: no peripheral edema.  Neurologic: Nonfocal, moving all four extremities  Skin: No lesions  Access: Left arm AVF    Basic Metabolic Panel:  Recent Labs Lab 01/09/17 1540 01/10/17 0009 01/13/17 0736 01/14/17 0354 01/15/17 1000  NA  --   --  126* 129* 130*  K  --   --  5.5* 5.0 4.2  CL  --   --  89* 93* 93*  CO2  --   --  25 28 27   GLUCOSE  --   --  183* 309* 171*  BUN  --   --  110* 75*  56*  CREATININE  --   --  7.61* 5.90* 4.82*  CALCIUM  --   --  8.0* 7.8* 7.6*  MG  --   --  2.1  --   --   PHOS 6.1* 5.9*  --   --  3.9    Liver Function Tests:  Recent Labs Lab 01/13/17 0736 01/15/17 1000  AST 13*  --   ALT 12*  --   ALKPHOS 42  --   BILITOT 0.9  --   PROT 5.3*  --   ALBUMIN 2.5* 2.3*   No results for input(s): LIPASE, AMYLASE in the last 168 hours. No results for input(s): AMMONIA in the last 168 hours.  CBC:  Recent Labs Lab 01/10/17 0009 01/13/17 0736 01/15/17 1000  WBC 8.1 12.0* 11.4*  HGB 10.9* 10.3* 8.9*  HCT 32.6* 31.3* 27.8*  MCV 88.7 89.2 87.9  PLT 177 166 155    Cardiac Enzymes: No results for input(s): CKTOTAL, CKMB, CKMBINDEX, TROPONINI in the last 168 hours.  BNP: Invalid input(s): POCBNP  CBG:  Recent Labs Lab 01/14/17 0738 01/14/17 1329 01/14/17 1633 01/14/17 2059 01/15/17 0732  GLUCAP 272* 117* 182* 256* 126*    Microbiology: Results for orders placed or performed during the hospital encounter of 12/30/16  Blood Culture (routine x 2)  Status: None   Collection Time: 12/30/16  4:28 PM  Result Value Ref Range Status   Specimen Description BLOOD RIGHT ANTECUBITAL  Final   Special Requests   Final    BOTTLES DRAWN AEROBIC AND ANAEROBIC Blood Culture adequate volume   Culture NO GROWTH 5 DAYS  Final   Report Status 01/04/2017 FINAL  Final  Blood Culture (routine x 2)     Status: None   Collection Time: 12/30/16  4:33 PM  Result Value Ref Range Status   Specimen Description BLOOD BLOOD RIGHT HAND  Final   Special Requests   Final    BOTTLES DRAWN AEROBIC AND ANAEROBIC Blood Culture adequate volume   Culture NO GROWTH 5 DAYS  Final   Report Status 01/04/2017 FINAL  Final  MRSA PCR Screening     Status: None   Collection Time: 12/31/16 12:50 AM  Result Value Ref Range Status   MRSA by PCR NEGATIVE NEGATIVE Final    Comment:        The GeneXpert MRSA Assay (FDA approved for NASAL specimens only), is one  component of a comprehensive MRSA colonization surveillance program. It is not intended to diagnose MRSA infection nor to guide or monitor treatment for MRSA infections.     Coagulation Studies: No results for input(s): LABPROT, INR in the last 72 hours.  Urinalysis: No results for input(s): COLORURINE, LABSPEC, PHURINE, GLUCOSEU, HGBUR, BILIRUBINUR, KETONESUR, PROTEINUR, UROBILINOGEN, NITRITE, LEUKOCYTESUR in the last 72 hours.  Invalid input(s): APPERANCEUR    Imaging: No results found.   Medications:   . sodium chloride Stopped (01/06/17 2100)   . amiodarone  400 mg Oral Daily  . aspirin EC  81 mg Oral Daily  . atorvastatin  20 mg Oral QHS  . budesonide (PULMICORT) nebulizer solution  0.5 mg Nebulization BID  . calcium acetate  667 mg Oral TID WC  . carvedilol  6.25 mg Oral BID  . doxazosin  8 mg Oral QPM  . epoetin (EPOGEN/PROCRIT) injection  4,000 Units Intravenous Q M,W,F-HD  . feeding supplement (NEPRO CARB STEADY)  237 mL Oral BID BM  . fluticasone  1 spray Each Nare Daily  . furosemide  40 mg Oral BID  . guaiFENesin  600 mg Oral BID  . heparin subcutaneous  5,000 Units Subcutaneous Q8H  . insulin aspart  0-5 Units Subcutaneous QHS  . insulin aspart  0-9 Units Subcutaneous TID WC  . insulin aspart  6 Units Subcutaneous TID WC  . insulin detemir  35 Units Subcutaneous QHS  . ipratropium-albuterol  3 mL Nebulization TID  . isosorbide mononitrate  30 mg Oral Daily  . multivitamin  1 tablet Oral QHS  . pantoprazole  40 mg Oral Daily  . senna-docusate  1 tablet Oral BID  . sodium chloride flush  3 mL Intravenous Q12H  . spironolactone  25 mg Oral BID   sodium chloride, acetaminophen **OR** acetaminophen, benzonatate, bisacodyl, fentaNYL (SUBLIMAZE) injection, guaiFENesin-dextromethorphan, heparin, ipratropium-albuterol, lidocaine-prilocaine, morphine injection, ondansetron (ZOFRAN) IV, phenylephrine, polyethylene glycol, senna-docusate, sodium chloride, sodium  chloride flush, traMADol  Assessment/ Plan:  [name] is a 76 y.o. white male with end stage renal disease on hemodialysis,  diabetes mellitus type II insulin dependent, hypertension, coronary artery disease, hyperlipidemia, prostate cancer status post prostatectomy, COPD.  MWF [facility]  1. End Stage Renal Disease MWF: seen and examined on hemodialysis. Extra treatment for uremic symptoms and hyperkalemia yesterday.  - 2 K bath - Continue MWF  2. Anemia of  chronic kidney disease: hemoglobin 8.9 - outpatient mircera - EPO with HD treatment.   3. 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   4. Secondary Hyperparathyroidism:  - continue calcium acetate with meals.    LOS: [place], [place] 10/17/201811:02 AM

## 2017-01-15 NOTE — Progress Notes (Signed)
Hd completed 

## 2017-01-15 NOTE — Discharge Instructions (Signed)
Diabetic diet    Activity as tolerated

## 2017-01-15 NOTE — Progress Notes (Signed)
This note also relates to the following rows which could not be included: Pulse Rate - Cannot attach notes to unvalidated device data Resp - Cannot attach notes to unvalidated device data BP - Cannot attach notes to unvalidated device data SpO2 - Cannot attach notes to unvalidated device data  HD STARTED  

## 2017-01-16 ENCOUNTER — Other Ambulatory Visit: Payer: Self-pay

## 2017-01-16 ENCOUNTER — Encounter
Admission: RE | Admit: 2017-01-16 | Discharge: 2017-01-16 | Disposition: A | Discharge status: Home / Self Care | Payer: Medicare Other | Source: Ambulatory Visit | Attending: Internal Medicine | Admitting: Internal Medicine

## 2017-01-16 LAB — GLUCOSE, CAPILLARY
Glucose-Capillary: 114 mg/dL — ABNORMAL HIGH (ref 65–99)
Glucose-Capillary: 149 mg/dL — ABNORMAL HIGH (ref 65–99)

## 2017-01-16 MED ORDER — TRAZODONE HCL 100 MG PO TABS
100.0000 mg | ORAL_TABLET | Freq: Every evening | ORAL | Status: DC | PRN
  Administered 2017-01-16: 100 mg via ORAL
  Filled 2017-01-16: qty 1

## 2017-01-16 MED ORDER — CARVEDILOL 3.125 MG PO TABS: 3.1250 mg | ORAL_TABLET | Freq: Two times a day (BID) | ORAL | Status: DC

## 2017-01-16 MED ORDER — TRAMADOL HCL 50 MG PO TABS: 50.0000 mg | ORAL_TABLET | Freq: Two times a day (BID) | ORAL | 0 refills | Status: DC | PRN

## 2017-01-16 NOTE — Progress Notes (Signed)
[name] to be D/C'd Skilled nursing facility per MD order.  Discussed prescriptions and follow up appointments with the patient. Prescriptions given to patient, medication list explained in detail. Pt verbalized understanding.  Allergies as of 01/16/2017      Reactions   Tetracyclines & Related Other (See Comments)   Reaction: Unknown      Medication List    STOP taking these medications   amLODipine 5 MG tablet Commonly known as:  NORVASC   lisinopril 20 MG tablet Commonly known as:  PRINIVIL,ZESTRIL     TAKE these medications   acetaminophen 325 MG tablet Commonly known as:  TYLENOL Take 2 tablets (650 mg total) by mouth every 6 (six) hours as needed for mild pain (or Fever >/= 101).   amiodarone 200 MG tablet Commonly known as:  PACERONE Take 1 tablet (200 mg total) by mouth daily.   aspirin EC 81 MG tablet Take 81 mg by mouth daily.   atorvastatin 20 MG tablet Commonly known as:  LIPITOR Take 20 mg by mouth at bedtime.   calcium acetate 667 MG capsule Commonly known as:  PHOSLO Take 1,334 mg by mouth 3 (three) times daily with meals.   carvedilol 3.125 MG tablet Commonly known as:  COREG Take 1 tablet (3.125 mg total) by mouth 2 (two) times daily. 8 am, 5 pm What changed:  medication strength  how much to take   doxazosin 8 MG tablet Commonly known as:  CARDURA Take 8 mg by mouth every evening.   furosemide 40 MG tablet Commonly known as:  LASIX Take 40 mg by mouth 2 (two) times daily.   GUAIFENESIN 1200 PO Take 1,200 mg by mouth at bedtime.   indomethacin 50 MG capsule Commonly known as:  INDOCIN Take 50 mg by mouth 2 (two) times daily with a meal.   insulin detemir 100 UNIT/ML injection Commonly known as:  LEVEMIR Inject 30 Units into the skin at bedtime.   insulin lispro 100 UNIT/ML injection Commonly known as:  HUMALOG Inject 10 Units into the skin 3 (three) times daily before meals.   ipratropium-albuterol 0.5-2.5 (3) MG/3ML  Soln Commonly known as:  DUONEB Take 3 mLs by nebulization every 6 (six) hours as needed.   isosorbide mononitrate 30 MG 24 hr tablet Commonly known as:  IMDUR Take 30 mg by mouth daily.   Lidocaine-Prilocaine (Bulk) 2.5-2.5 % Crea Apply 1 application topically as needed. Apply to fistula site prior to dialysis treatments as needed   omeprazole 20 MG capsule Commonly known as:  PRILOSEC Take 20 mg by mouth 2 (two) times daily before a meal.   OXYGEN Inhale 2-5 L into the lungs continuous.   spironolactone 25 MG tablet Commonly known as:  ALDACTONE Take 25 mg by mouth daily.   traMADol 50 MG tablet Commonly known as:  ULTRAM Take 1 tablet (50 mg total) by mouth every 12 (twelve) hours as needed for moderate pain or severe pain.       Vitals:   01/16/17 0745 01/16/17 0928  BP: (!) 153/42 (!) 146/35  Pulse: 65 70  Resp: 16 16  Temp:  98.1 F (36.7 C)  SpO2: 99% 98%    Skin clean, dry and intact without evidence of skin break down, no evidence of skin tears noted. IV catheter discontinued intact. Site without signs and symptoms of complications. Dressing and pressure applied. Pt denies pain at this time. No complaints noted.  An After Visit Summary was printed and given to the  patient. Patient escorted via EMS to [facility]

## 2017-01-16 NOTE — Telephone Encounter (Signed)
Rx sent to [facility] phone : [phone] , fax : [phone]  

## 2017-01-16 NOTE — Progress Notes (Signed)
Nutrition Follow-up  DOCUMENTATION CODES:   Obesity unspecified  INTERVENTION:   Recommend check Vitamin D levels  Continue Nepro Shake po BID, each supplement provides 425 kcal and 19 grams protein.  Continue multivitamin with minerals (Rena-vite) daily.   Bowel regimen as needed  NUTRITION DIAGNOSIS:   Inadequate oral intake related to poor appetite, acute illness as evidenced by per patient/family report, percent weight loss.  -resolving  GOAL:   Patient will meet greater than or equal to 90% of their needs  -progressing   MONITOR:   PO intake, Labs, I & O's, Weight trends, Supplement acceptance  ASSESSMENT:   76 y.o. white male with end stage renal disease on hemodialysis,  diabetes mellitus type II insulin dependent, hypertension, coronary artery disease, hyperlipidemia, prostate cancer status post prostatectomy, COPD   -On 10/8 patient underwent bronchoscopy with right VATS and talc pleurodesis for recurrent right-sided pleural effusion with chest tube placement. Chest tube removed 10/15.   Pt's appetite is improving; eating 100% of his meals and drinking Nepro. Last HD 10/17. Per MD note, chest radiographs improved. Pt is weight stable.  Pt with secondary hyperparathyroidism; recommend check Vitamin D levels.    Medications reviewed and include: aspirin, Phoslo, epogen, lasix, heparin, insulin, MVI, protonix, miralax, senokot, aldactone    Labs reviewed: Na 130(L), K 4.2 wnl, Cl 93(L), BUN 56(H), creat 4.82(H), Ca 7.6(L) adj. 8.96 wnl, P 3.9 wnl, alb 2.3(L) Wbc- 11.4(H), Hgb 8.9(L), Hct 27.8(L) PTH- 101(H)- 10/9 cbgs- 309, 171 x 48 hrs AIC 8.4(H)- 8/27  Diet Order:  Diet renal with fluid restriction Fluid restriction: 1200 mL Fluid; Room service appropriate? Yes; Fluid consistency: Thin  Skin:  Wound (see comment) (closed incision right chest)  Last BM:  10/18- type 4  Height:   Ht Readings from Last 1 Encounters:  01/06/17 5\' 8"  (1.727 m)    Weight:    Wt Readings from Last 1 Encounters:  01/16/17 223 lb 15.8 oz (101.6 kg)    Ideal Body Weight:  70 kg  BMI:  Body mass index is 34.06 kg/m.  Estimated Nutritional Needs:   Kcal:  2000-2300 (MSJ x 1.2-1.4)  Protein:  100-120g/day  Fluid:  per MD  EDUCATION NEEDS:   No education needs identified at this time  Koleen Distance MS, RD, [place] Pager #[phone] After Hours Pager: [phone]

## 2017-01-16 NOTE — Progress Notes (Signed)
[facility]  ROUNDING NOTE   Subjective:   Hemodialysis treatment yesterday. Tolerated treatment well. UF of 2.5 liters.   Patient states he is feeling better.   Objective:  Vital signs in last 24 hours:  Temp:  [97.8 F (36.6 C)-98.1 F (36.7 C)] 98.1 F (36.7 C) (10/18 0928) Pulse Rate:  [61-72] 70 (10/18 0928) Resp:  [11-20] 16 (10/18 0928) BP: (120-163)/(31-86) 146/35 (10/18 0928) SpO2:  [97 %-100 %] 98 % (10/18 0928) Weight:  [101.3 kg (223 lb 5.2 oz)-101.6 kg (223 lb 15.8 oz)] 101.6 kg (223 lb 15.8 oz) (10/18 0500)  Weight change: -0.509 kg (-1 lb 2 oz) Filed Weights   01/15/17 1000 01/15/17 1400 01/16/17 0500  Weight: 104 kg (229 lb 4.5 oz) 101.3 kg (223 lb 5.2 oz) 101.6 kg (223 lb 15.8 oz)    Intake/Output: I/O last 3 completed shifts: In: 600 [P.O.:600] Out: 0    Intake/Output this shift:  Total I/O In: 360 [P.O.:360] Out: -   Physical Exam: General: NAD, laying in bed  Head: Normocephalic, atraumatic. Moist oral mucosal membranes  Eyes: Anicteric, PERRL  Neck: Supple, trachea midline  Lungs:  Clear to auscultation   Heart: Regular rate and rhythm  Abdomen:  Soft, nontender,   Extremities: no peripheral edema.  Neurologic: Nonfocal, moving all four extremities  Skin: No lesions  Access: Left arm AVF    Basic Metabolic Panel:  Recent Labs Lab 01/09/17 1540 01/10/17 0009 01/13/17 0736 01/14/17 0354 01/15/17 1000  NA  --   --  126* 129* 130*  K  --   --  5.5* 5.0 4.2  CL  --   --  89* 93* 93*  CO2  --   --  25 28 27   GLUCOSE  --   --  183* 309* 171*  BUN  --   --  110* 75* 56*  CREATININE  --   --  7.61* 5.90* 4.82*  CALCIUM  --   --  8.0* 7.8* 7.6*  MG  --   --  2.1  --   --   PHOS 6.1* 5.9*  --   --  3.9    Liver Function Tests:  Recent Labs Lab 01/13/17 0736 01/15/17 1000  AST 13*  --   ALT 12*  --   ALKPHOS 42  --   BILITOT 0.9  --   PROT 5.3*  --   ALBUMIN 2.5* 2.3*   No results for input(s): LIPASE, AMYLASE  in the last 168 hours. No results for input(s): AMMONIA in the last 168 hours.  CBC:  Recent Labs Lab 01/10/17 0009 01/13/17 0736 01/15/17 1000  WBC 8.1 12.0* 11.4*  HGB 10.9* 10.3* 8.9*  HCT 32.6* 31.3* 27.8*  MCV 88.7 89.2 87.9  PLT 177 166 155    Cardiac Enzymes: No results for input(s): CKTOTAL, CKMB, CKMBINDEX, TROPONINI in the last 168 hours.  BNP: Invalid input(s): POCBNP  CBG:  Recent Labs Lab 01/14/17 2059 01/15/17 0732 01/15/17 1631 01/15/17 2124 01/16/17 0729  GLUCAP 256* 126* 179* 170* 114*    Microbiology: Results for orders placed or performed during the hospital encounter of 12/30/16  Blood Culture (routine x 2)     Status: None   Collection Time: 12/30/16  4:28 PM  Result Value Ref Range Status   Specimen Description BLOOD RIGHT ANTECUBITAL  Final   Special Requests   Final    BOTTLES DRAWN AEROBIC AND ANAEROBIC Blood Culture adequate volume   Culture NO GROWTH  5 DAYS  Final   Report Status 01/04/2017 FINAL  Final  Blood Culture (routine x 2)     Status: None   Collection Time: 12/30/16  4:33 PM  Result Value Ref Range Status   Specimen Description BLOOD BLOOD RIGHT HAND  Final   Special Requests   Final    BOTTLES DRAWN AEROBIC AND ANAEROBIC Blood Culture adequate volume   Culture NO GROWTH 5 DAYS  Final   Report Status 01/04/2017 FINAL  Final  MRSA PCR Screening     Status: None   Collection Time: 12/31/16 12:50 AM  Result Value Ref Range Status   MRSA by PCR NEGATIVE NEGATIVE Final    Comment:        The GeneXpert MRSA Assay (FDA approved for NASAL specimens only), is one component of a comprehensive MRSA colonization surveillance program. It is not intended to diagnose MRSA infection nor to guide or monitor treatment for MRSA infections.     Coagulation Studies: No results for input(s): LABPROT, INR in the last 72 hours.  Urinalysis: No results for input(s): COLORURINE, LABSPEC, PHURINE, GLUCOSEU, HGBUR, BILIRUBINUR,  KETONESUR, PROTEINUR, UROBILINOGEN, NITRITE, LEUKOCYTESUR in the last 72 hours.  Invalid input(s): APPERANCEUR    Imaging: No results found.   Medications:   . sodium chloride Stopped (01/06/17 2100)   . amiodarone  400 mg Oral Daily  . aspirin EC  81 mg Oral Daily  . atorvastatin  20 mg Oral QHS  . budesonide (PULMICORT) nebulizer solution  0.5 mg Nebulization BID  . calcium acetate  667 mg Oral TID WC  . carvedilol  6.25 mg Oral BID  . doxazosin  8 mg Oral QPM  . epoetin (EPOGEN/PROCRIT) injection  10,000 Units Intravenous Q M,W,F-HD  . feeding supplement (NEPRO CARB STEADY)  237 mL Oral BID BM  . fluticasone  1 spray Each Nare Daily  . furosemide  40 mg Oral BID  . guaiFENesin  600 mg Oral BID  . heparin subcutaneous  5,000 Units Subcutaneous Q8H  . insulin aspart  0-5 Units Subcutaneous QHS  . insulin aspart  0-9 Units Subcutaneous TID WC  . insulin aspart  6 Units Subcutaneous TID WC  . insulin detemir  35 Units Subcutaneous QHS  . ipratropium-albuterol  3 mL Nebulization BID  . isosorbide mononitrate  30 mg Oral Daily  . multivitamin  1 tablet Oral QHS  . pantoprazole  40 mg Oral Daily  . senna-docusate  1 tablet Oral BID  . sodium chloride flush  3 mL Intravenous Q12H  . spironolactone  25 mg Oral BID   sodium chloride, acetaminophen **OR** acetaminophen, benzonatate, bisacodyl, diphenhydrAMINE, fentaNYL (SUBLIMAZE) injection, guaiFENesin-dextromethorphan, ipratropium-albuterol, lidocaine-prilocaine, morphine injection, ondansetron (ZOFRAN) IV, phenylephrine, polyethylene glycol, senna-docusate, sodium chloride, sodium chloride flush, traMADol, traZODone  Assessment/ Plan:  Calvin Good is a 76 y.o. white male with end stage renal disease on hemodialysis,  diabetes mellitus type II insulin dependent, hypertension, coronary artery disease, hyperlipidemia, prostate cancer status post prostatectomy, COPD.  MWF [facility]  1. End Stage Renal Disease  MWF: - Continue MWF schedule  2. Anemia of chronic kidney disease: hemoglobin 8.9 - outpatient mircera   3. 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   4. Secondary Hyperparathyroidism:  - continue calcium acetate with meals.    LOS: [place], [place] 10/18/201810:58 AM

## 2017-01-16 NOTE — Discharge Summary (Addendum)
[place] [facility] [place] NAME: Calvin Good    MR#:  [id number]  DATE OF BIRTH:  04/09/1940  DATE OF ADMISSION:  12/30/2016 ADMITTING PHYSICIAN: Bettey Costa, MD  DATE OF DISCHARGE: 01/16/2017  PRIMARY CARE PHYSICIAN: [name], MD   ADMISSION DIAGNOSIS:  Afib RECURRENT PLEURAL EFFUSION  DISCHARGE DIAGNOSIS:  Active Problems:   Atrial fibrillation (HCC)   Recurrent pleural effusion on right   ESRD on dialysis [facility])   SECONDARY DIAGNOSIS:   Past Medical History:  Diagnosis Date  . Cancer [facility])    Prostate  . CHF (congestive heart failure) ([place])   . Chronic kidney disease   . Complication of anesthesia    hard to wake up  . COPD (chronic obstructive pulmonary disease) ([place])   . Diabetes mellitus without complication ([place])   . Difficult intubation   . Dyspnea   . GERD (gastroesophageal reflux disease)   . Hyperlipidemia   . Hypertension   . Pleural effusion   . Renal insufficiency      ADMITTING HISTORY  CHIEF COMPLAINT:   Chest pain HISTORY OF PRESENT ILLNESS:  Calvin Good  is a 76 y.o. 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 Patient reports he had 10 out of 10 substernal chest pain without radiation. This resolved after 20 minutes. No relieving or aggravating factors. Patient is complaining chest pain currently. His heart rate was noted to be in the 130s to 170s and in atrial fibrillation. He does complain of shortness of breath and palpitations. He was started on diltiazem drip in the emergency room however this had no effect on his heart rate. Cardiology was consulted and recommended amiodarone drip which is controlling his heart rates in the low 100s.   HOSPITAL COURSE:     76 year old male with past medical history significant for end-stage renal disease on hemodialysis, congestive heart failure, COPD on 2L home oxygen, diabetes and hypertension presents to hospital secondary to worsening dyspnea.  # Uremic encephalopathy - resolved Discussed with Dr. Juleen China. HD to be continues as before. MWF  # Recurrent right pleural effusion - Talc pleurodesis done. -Empirically was on Levaquin for right lower lobe infiltrate. Now stopped - Required Bipap post-procedure and now on Lena 4 L/min CXR shows improvement.  Chest tube removed. 01/14/2017  # Paroxysmal atrial fibrillation with rapid ventricular response Was on amiodarone drip and transitioned to oral amiodarone. - Not on anticoagulation as outpatient due to GI bleed.  F/U with Dr. Nehemiah Massed in 1 week  # Acute on Chronic respiratory failure with hypoxia due to pleural effusion  # Acute on chronic combined systolic and diastolic congestive heart failure Improved. On oral Lasix.  #Diabetes mellitus Levemir, SSI  # COPD-stable at this time. On 3-6L home oxygen -Continue inhalers.  # CAD-with chronic stable angina. Status post cardiac catheterization in May 2018 with diffuse RCA disease and collaterals LAD, left circumflex 75% stenosis. Troponins were elevated on admission. Cardiology recommended medical management. F/U with cardiology as OP  Stable for discharge to SNF for PT  Palliative care to follow at SNF  CONSULTS OBTAINED:  Treatment Team:  Corey Skains, MD Murlean Iba, MD  DRUG ALLERGIES:   Allergies  Allergen Reactions  . Tetracyclines & Related Other (See Comments)  Reaction: Unknown     DISCHARGE MEDICATIONS:   Current Discharge Medication List    START taking these medications   Details  amiodarone (PACERONE) 200 MG tablet Take 1 tablet (200 mg total) by mouth daily. Qty: 30 tablet, Refills: 0      CONTINUE these medications which have CHANGED    Details  carvedilol (COREG) 3.125 MG tablet Take 1 tablet (3.125 mg total) by mouth 2 (two) times daily. 8 am, 5 pm    traMADol (ULTRAM) 50 MG tablet Take 1 tablet (50 mg total) by mouth every 12 (twelve) hours as needed for moderate pain or severe pain. Qty: 10 tablet, Refills: 0      CONTINUE these medications which have NOT CHANGED   Details  acetaminophen (TYLENOL) 325 MG tablet Take 2 tablets (650 mg total) by mouth every 6 (six) hours as needed for mild pain (or Fever >/= 101).    aspirin EC 81 MG tablet Take 81 mg by mouth daily.    atorvastatin (LIPITOR) 20 MG tablet Take 20 mg by mouth at bedtime.     calcium acetate (PHOSLO) 667 MG capsule Take 1,334 mg by mouth 3 (three) times daily with meals.     doxazosin (CARDURA) 8 MG tablet Take 8 mg by mouth every evening.     furosemide (LASIX) 40 MG tablet Take 40 mg by mouth 2 (two) times daily.     GUAIFENESIN 1200 PO Take 1,200 mg by mouth at bedtime.    indomethacin (INDOCIN) 50 MG capsule Take 50 mg by mouth 2 (two) times daily with a meal.    insulin detemir (LEVEMIR) 100 UNIT/ML injection Inject 30 Units into the skin at bedtime.    insulin lispro (HUMALOG) 100 UNIT/ML injection Inject 10 Units into the skin 3 (three) times daily before meals.    ipratropium-albuterol (DUONEB) 0.5-2.5 (3) MG/3ML SOLN Take 3 mLs by nebulization every 6 (six) hours as needed. Qty: 360 mL    isosorbide mononitrate (IMDUR) 30 MG 24 hr tablet Take 30 mg by mouth daily.     Lidocaine-Prilocaine, Bulk, 2.5-2.5 % CREA Apply 1 application topically as needed. Apply to fistula site prior to dialysis treatments as needed    omeprazole (PRILOSEC) 20 MG capsule Take 20 mg by mouth 2 (two) times daily before a meal.     OXYGEN Inhale 2-5 L into the lungs continuous.     spironolactone (ALDACTONE) 25 MG tablet Take 25 mg by mouth daily.       STOP taking these medications     amLODipine (NORVASC) 5 MG tablet      lisinopril  (PRINIVIL,ZESTRIL) 20 MG tablet         Today   VITAL SIGNS:  Blood pressure (!) 146/35, pulse 70, temperature 98.1 F (36.7 C), temperature source Oral, resp. rate 16, height 5\' 8"  (1.727 m), weight 101.6 kg (223 lb 15.8 oz), SpO2 98 %.  I/O:   Intake/Output Summary (Last 24 hours) at 01/16/17 1040 Last data filed at 01/16/17 0928  Gross per 24 hour  Intake              960 ml  Output                0 ml  Net              960 ml    PHYSICAL EXAMINATION:  Physical Exam  GENERAL:  76 y.o.-year-old patient lying in the bed with no acute distress.  LUNGS:  Normal breath sounds bilaterally, no wheezing, rales,rhonchi or crepitation. No use of accessory muscles of respiration.  CARDIOVASCULAR: S1, S2 normal. No murmurs, rubs, or gallops.  ABDOMEN: Soft, non-tender, non-distended. Bowel sounds present. No organomegaly or mass.  NEUROLOGIC: Moves all 4 extremities. PSYCHIATRIC: The patient is alert and oriented x 3.  SKIN: No obvious rash, lesion, or ulcer.   DATA REVIEW:   CBC  Recent Labs Lab 01/15/17 1000  WBC 11.4*  HGB 8.9*  HCT 27.8*  PLT 155    Chemistries   Recent Labs Lab 01/13/17 0736  01/15/17 1000  NA 126*  < > 130*  K 5.5*  < > 4.2  CL 89*  < > 93*  CO2 25  < > 27  GLUCOSE 183*  < > 171*  BUN 110*  < > 56*  CREATININE 7.61*  < > 4.82*  CALCIUM 8.0*  < > 7.6*  MG 2.1  --   --   AST 13*  --   --   ALT 12*  --   --   ALKPHOS 42  --   --   BILITOT 0.9  --   --   < > = values in this interval not displayed.  Cardiac Enzymes No results for input(s): TROPONINI in the last 168 hours.  Microbiology Results  Results for orders placed or performed during the hospital encounter of 12/30/16  Blood Culture (routine x 2)     Status: None   Collection Time: 12/30/16  4:28 PM  Result Value Ref Range Status   Specimen Description BLOOD RIGHT ANTECUBITAL  Final   Special Requests   Final    BOTTLES DRAWN AEROBIC AND ANAEROBIC Blood Culture adequate volume    Culture NO GROWTH 5 DAYS  Final   Report Status 01/04/2017 FINAL  Final  Blood Culture (routine x 2)     Status: None   Collection Time: 12/30/16  4:33 PM  Result Value Ref Range Status   Specimen Description BLOOD BLOOD RIGHT HAND  Final   Special Requests   Final    BOTTLES DRAWN AEROBIC AND ANAEROBIC Blood Culture adequate volume   Culture NO GROWTH 5 DAYS  Final   Report Status 01/04/2017 FINAL  Final  MRSA PCR Screening     Status: None   Collection Time: 12/31/16 12:50 AM  Result Value Ref Range Status   MRSA by PCR NEGATIVE NEGATIVE Final    Comment:        The GeneXpert MRSA Assay (FDA approved for NASAL specimens only), is one component of a comprehensive MRSA colonization surveillance program. It is not intended to diagnose MRSA infection nor to guide or monitor treatment for MRSA infections.     RADIOLOGY:  No results found.  Follow up with PCP in 1 week.  Management plans discussed with the patient, family and they are in agreement.  CODE STATUS:     Code Status Orders        Start     Ordered   01/13/17 0930  Do not attempt resuscitation (DNR)  Continuous    Question Answer Comment  In the event of cardiac or respiratory ARREST Do not call a "code blue"   In the event of cardiac or respiratory ARREST Do not perform Intubation, CPR, defibrillation or ACLS   In the event of cardiac or respiratory ARREST Use medication by any route, position, wound care, and other measures to relive pain and suffering. May use oxygen, suction and manual treatment  of airway obstruction as needed for comfort.      01/13/17 0929    Code Status History    Date Active Date Inactive Code Status Order ID Comments User Context   12/30/2016  7:57 PM 01/13/2017  9:29 AM Full Code [id number]  Bettey Costa, MD Inpatient   11/25/2016  5:59 AM 11/27/2016  8:08 PM Full Code [id number]  Harrie Foreman, MD Inpatient   10/31/2016  1:48 AM 10/31/2016  7:29 PM Full Code [id number]  Saundra Shelling, MD Inpatient   10/16/2016 11:41 PM 10/21/2016 12:21 AM Full Code [id number]  Vaughan Basta, MD ED   09/14/2016  6:31 PM 09/24/2016  2:43 AM Full Code [id number]  Nicholes Mango, MD Inpatient   08/26/2016  6:45 AM 08/28/2016  8:11 PM Full Code [id number]  Saundra Shelling, MD Inpatient   08/20/2016  3:05 AM 08/22/2016  1:01 PM Full Code [id number]  Lance Coon, MD ED    Advance Directive Documentation     Most Recent Value  Type of Advance Directive  Healthcare Power of Attorney  Pre-existing out of facility DNR order (yellow form or pink MOST form)  -  "MOST" Form in Place?  -      TOTAL TIME TAKING CARE OF THIS PATIENT ON DAY OF DISCHARGE: more than 30 minutes.   Hillary Bow R M.D on 01/16/2017 at 10:40 AM  Between 7am to 6pm - Pager - [phone]  After 6pm go to [email or web address] - password [email or web address] [place] [facility]  Office  [phone]  CC: Primary care physician; [name], MD  Note: This dictation was prepared with Dragon dictation along with smaller phrase technology. Any transcriptional errors that result from this process are unintentional.

## 2017-01-21 ENCOUNTER — Other Ambulatory Visit
Admission: RE | Admit: 2017-01-21 | Discharge: 2017-01-21 | Disposition: A | Discharge status: Home / Self Care | Payer: Medicare Other | Source: Ambulatory Visit | Attending: Internal Medicine | Admitting: Internal Medicine

## 2017-01-21 DIAGNOSIS — I129 Hypertensive chronic kidney disease with stage 1 through stage 4 chronic kidney disease, or unspecified chronic kidney disease: Secondary | ICD-10-CM | POA: Diagnosis present

## 2017-01-21 DIAGNOSIS — N189 Chronic kidney disease, unspecified: Secondary | ICD-10-CM | POA: Insufficient documentation

## 2017-01-21 LAB — CBC WITH DIFFERENTIAL/PLATELET
BASOS ABS: 0 10*3/uL (ref 0–0.1)
BASOS PCT: 0 %
Eosinophils Absolute: 0.2 10*3/uL (ref 0–0.7)
Eosinophils Relative: 2 %
HCT: 27.3 % — ABNORMAL LOW (ref 40.0–52.0)
HEMOGLOBIN: 8.9 g/dL — AB (ref 13.0–18.0)
LYMPHS PCT: 10 %
Lymphs Abs: 0.8 10*3/uL — ABNORMAL LOW (ref 1.0–3.6)
MCH: 29 pg (ref 26.0–34.0)
MCHC: 32.6 g/dL (ref 32.0–36.0)
MCV: 88.8 fL (ref 80.0–100.0)
Monocytes Absolute: 0.7 10*3/uL (ref 0.2–1.0)
Monocytes Relative: 8 %
NEUTROS ABS: 6.6 10*3/uL — AB (ref 1.4–6.5)
NEUTROS PCT: 80 %
Platelets: 161 10*3/uL (ref 150–440)
RBC: 3.07 MIL/uL — AB (ref 4.40–5.90)
RDW: 15 % — AB (ref 11.5–14.5)
WBC: 8.3 10*3/uL (ref 3.8–10.6)

## 2017-01-21 LAB — COMPREHENSIVE METABOLIC PANEL
ALBUMIN: 2.4 g/dL — AB (ref 3.5–5.0)
ALT: 12 U/L — AB (ref 17–63)
AST: 11 U/L — AB (ref 15–41)
Alkaline Phosphatase: 46 U/L (ref 38–126)
Anion gap: 11 (ref 5–15)
BILIRUBIN TOTAL: 0.6 mg/dL (ref 0.3–1.2)
BUN: 25 mg/dL — AB (ref 6–20)
CALCIUM: 8.1 mg/dL — AB (ref 8.9–10.3)
CO2: 29 mmol/L (ref 22–32)
Chloride: 94 mmol/L — ABNORMAL LOW (ref 101–111)
Creatinine, Ser: 4.62 mg/dL — ABNORMAL HIGH (ref 0.61–1.24)
GFR calc Af Amer: 13 mL/min — ABNORMAL LOW (ref >60)
GFR calc non Af Amer: 11 mL/min — ABNORMAL LOW (ref >60)
GLUCOSE: 250 mg/dL — AB (ref 65–99)
POTASSIUM: 4.3 mmol/L (ref 3.5–5.1)
Sodium: 134 mmol/L — ABNORMAL LOW (ref 135–145)
TOTAL PROTEIN: 5.4 g/dL — AB (ref 6.5–8.1)

## 2017-01-23 ENCOUNTER — Other Ambulatory Visit
Admission: RE | Admit: 2017-01-23 | Discharge: 2017-01-23 | Disposition: A | Discharge status: Home / Self Care | Payer: Medicare Other | Source: Ambulatory Visit | Attending: Gerontology | Admitting: Gerontology

## 2017-01-23 DIAGNOSIS — I130 Hypertensive heart and chronic kidney disease with heart failure and stage 1 through stage 4 chronic kidney disease, or unspecified chronic kidney disease: Secondary | ICD-10-CM | POA: Diagnosis present

## 2017-01-23 LAB — COMPREHENSIVE METABOLIC PANEL
ALBUMIN: 2.4 g/dL — AB (ref 3.5–5.0)
ALT: 12 U/L — ABNORMAL LOW (ref 17–63)
ANION GAP: 7 (ref 5–15)
AST: 12 U/L — ABNORMAL LOW (ref 15–41)
Alkaline Phosphatase: 49 U/L (ref 38–126)
BILIRUBIN TOTAL: 0.7 mg/dL (ref 0.3–1.2)
BUN: 17 mg/dL (ref 6–20)
CALCIUM: 8.4 mg/dL — AB (ref 8.9–10.3)
CO2: 29 mmol/L (ref 22–32)
Chloride: 99 mmol/L — ABNORMAL LOW (ref 101–111)
Creatinine, Ser: 3.77 mg/dL — ABNORMAL HIGH (ref 0.61–1.24)
GFR calc Af Amer: 16 mL/min — ABNORMAL LOW (ref >60)
GFR calc non Af Amer: 14 mL/min — ABNORMAL LOW (ref >60)
GLUCOSE: 154 mg/dL — AB (ref 65–99)
POTASSIUM: 4.3 mmol/L (ref 3.5–5.1)
Sodium: 135 mmol/L (ref 135–145)
TOTAL PROTEIN: 5.6 g/dL — AB (ref 6.5–8.1)

## 2017-01-23 LAB — CBC WITH DIFFERENTIAL/PLATELET
BASOS PCT: 1 %
Basophils Absolute: 0 10*3/uL (ref 0–0.1)
EOS ABS: 0.2 10*3/uL (ref 0–0.7)
Eosinophils Relative: 2 %
HEMATOCRIT: 27.3 % — AB (ref 40.0–52.0)
Hemoglobin: 8.7 g/dL — ABNORMAL LOW (ref 13.0–18.0)
LYMPHS ABS: 1 10*3/uL (ref 1.0–3.6)
Lymphocytes Relative: 15 %
MCH: 28.2 pg (ref 26.0–34.0)
MCHC: 31.9 g/dL — AB (ref 32.0–36.0)
MCV: 88.2 fL (ref 80.0–100.0)
MONO ABS: 0.6 10*3/uL (ref 0.2–1.0)
MONOS PCT: 9 %
NEUTROS ABS: 4.9 10*3/uL (ref 1.4–6.5)
Neutrophils Relative %: 73 %
Platelets: 153 10*3/uL (ref 150–440)
RBC: 3.1 MIL/uL — ABNORMAL LOW (ref 4.40–5.90)
RDW: 15.4 % — ABNORMAL HIGH (ref 11.5–14.5)
WBC: 6.6 10*3/uL (ref 3.8–10.6)

## 2017-01-27 ENCOUNTER — Emergency Department: Payer: Medicare Other

## 2017-01-27 ENCOUNTER — Inpatient Hospital Stay
Admission: EM | Admit: 2017-01-27 | Discharge: 2017-01-28 | DRG: 308 | Disposition: A | Discharge status: Home / Self Care | Payer: Medicare Other | Attending: Internal Medicine | Admitting: Internal Medicine

## 2017-01-27 DIAGNOSIS — E785 Hyperlipidemia, unspecified: Secondary | ICD-10-CM | POA: Diagnosis not present

## 2017-01-27 DIAGNOSIS — Z79899 Other long term (current) drug therapy: Secondary | ICD-10-CM

## 2017-01-27 DIAGNOSIS — Z66 Do not resuscitate: Secondary | ICD-10-CM | POA: Diagnosis present

## 2017-01-27 DIAGNOSIS — Z9981 Dependence on supplemental oxygen: Secondary | ICD-10-CM

## 2017-01-27 DIAGNOSIS — J449 Chronic obstructive pulmonary disease, unspecified: Secondary | ICD-10-CM | POA: Diagnosis not present

## 2017-01-27 DIAGNOSIS — Z888 Allergy status to other drugs, medicaments and biological substances status: Secondary | ICD-10-CM

## 2017-01-27 DIAGNOSIS — J9610 Chronic respiratory failure, unspecified whether with hypoxia or hypercapnia: Secondary | ICD-10-CM | POA: Diagnosis not present

## 2017-01-27 DIAGNOSIS — I5022 Chronic systolic (congestive) heart failure: Secondary | ICD-10-CM | POA: Diagnosis not present

## 2017-01-27 DIAGNOSIS — I132 Hypertensive heart and chronic kidney disease with heart failure and with stage 5 chronic kidney disease, or end stage renal disease: Secondary | ICD-10-CM | POA: Diagnosis present

## 2017-01-27 DIAGNOSIS — I4891 Unspecified atrial fibrillation: Secondary | ICD-10-CM | POA: Diagnosis present

## 2017-01-27 DIAGNOSIS — Z7982 Long term (current) use of aspirin: Secondary | ICD-10-CM

## 2017-01-27 DIAGNOSIS — L89152 Pressure ulcer of sacral region, stage 2: Secondary | ICD-10-CM | POA: Diagnosis not present

## 2017-01-27 DIAGNOSIS — Z992 Dependence on renal dialysis: Secondary | ICD-10-CM

## 2017-01-27 DIAGNOSIS — L8990 Pressure ulcer of unspecified site, unspecified stage: Secondary | ICD-10-CM | POA: Insufficient documentation

## 2017-01-27 DIAGNOSIS — E1122 Type 2 diabetes mellitus with diabetic chronic kidney disease: Secondary | ICD-10-CM | POA: Diagnosis present

## 2017-01-27 DIAGNOSIS — Z9889 Other specified postprocedural states: Secondary | ICD-10-CM

## 2017-01-27 DIAGNOSIS — I2510 Atherosclerotic heart disease of native coronary artery without angina pectoris: Secondary | ICD-10-CM | POA: Diagnosis not present

## 2017-01-27 DIAGNOSIS — Z8546 Personal history of malignant neoplasm of prostate: Secondary | ICD-10-CM

## 2017-01-27 DIAGNOSIS — I482 Chronic atrial fibrillation: Secondary | ICD-10-CM | POA: Diagnosis present

## 2017-01-27 DIAGNOSIS — L89620 Pressure ulcer of left heel, unstageable: Secondary | ICD-10-CM | POA: Diagnosis present

## 2017-01-27 DIAGNOSIS — N186 End stage renal disease: Secondary | ICD-10-CM | POA: Diagnosis not present

## 2017-01-27 DIAGNOSIS — K219 Gastro-esophageal reflux disease without esophagitis: Secondary | ICD-10-CM | POA: Diagnosis present

## 2017-01-27 DIAGNOSIS — Z87891 Personal history of nicotine dependence: Secondary | ICD-10-CM

## 2017-01-27 DIAGNOSIS — K648 Other hemorrhoids: Secondary | ICD-10-CM | POA: Diagnosis not present

## 2017-01-27 DIAGNOSIS — Z96651 Presence of right artificial knee joint: Secondary | ICD-10-CM | POA: Diagnosis present

## 2017-01-27 DIAGNOSIS — R0609 Other forms of dyspnea: Secondary | ICD-10-CM

## 2017-01-27 DIAGNOSIS — J90 Pleural effusion, not elsewhere classified: Secondary | ICD-10-CM

## 2017-01-27 DIAGNOSIS — Z794 Long term (current) use of insulin: Secondary | ICD-10-CM | POA: Diagnosis not present

## 2017-01-27 LAB — BASIC METABOLIC PANEL
ANION GAP: 11 (ref 5–15)
BUN: 21 mg/dL — ABNORMAL HIGH (ref 6–20)
CALCIUM: 8.5 mg/dL — AB (ref 8.9–10.3)
CO2: 28 mmol/L (ref 22–32)
CREATININE: 3.58 mg/dL — AB (ref 0.61–1.24)
Chloride: 94 mmol/L — ABNORMAL LOW (ref 101–111)
GFR calc non Af Amer: 15 mL/min — ABNORMAL LOW (ref >60)
GFR, EST AFRICAN AMERICAN: 17 mL/min — AB (ref >60)
Glucose, Bld: 175 mg/dL — ABNORMAL HIGH (ref 65–99)
Potassium: 3.6 mmol/L (ref 3.5–5.1)
SODIUM: 133 mmol/L — AB (ref 135–145)

## 2017-01-27 LAB — TROPONIN I: Troponin I: <0.03 ng/mL (ref <0.03)

## 2017-01-27 LAB — CBC
HCT: 30.2 % — ABNORMAL LOW (ref 40.0–52.0)
HEMOGLOBIN: 9.7 g/dL — AB (ref 13.0–18.0)
MCH: 28.3 pg (ref 26.0–34.0)
MCHC: 32.3 g/dL (ref 32.0–36.0)
MCV: 87.7 fL (ref 80.0–100.0)
PLATELETS: 155 10*3/uL (ref 150–440)
RBC: 3.44 MIL/uL — ABNORMAL LOW (ref 4.40–5.90)
RDW: 15.6 % — AB (ref 11.5–14.5)
WBC: 9.6 10*3/uL (ref 3.8–10.6)

## 2017-01-27 LAB — GLUCOSE, CAPILLARY: GLUCOSE-CAPILLARY: 177 mg/dL — AB (ref 65–99)

## 2017-01-27 MED ORDER — CARVEDILOL 3.125 MG PO TABS
3.1250 mg | ORAL_TABLET | Freq: Two times a day (BID) | ORAL | Status: DC
  Administered 2017-01-27 – 2017-01-28 (×3): 3.125 mg via ORAL
  Filled 2017-01-27 (×3): qty 1

## 2017-01-27 MED ORDER — CALCIUM ACETATE (PHOS BINDER) 667 MG PO CAPS
1334.0000 mg | ORAL_CAPSULE | Freq: Three times a day (TID) | ORAL | Status: DC
  Administered 2017-01-28 (×3): 1334 mg via ORAL
  Filled 2017-01-27 (×3): qty 2

## 2017-01-27 MED ORDER — HYDROCORTISONE ACETATE 25 MG RE SUPP
25.0000 mg | Freq: Three times a day (TID) | RECTAL | Status: DC
  Administered 2017-01-28 (×2): 25 mg via RECTAL
  Filled 2017-01-27 (×4): qty 1

## 2017-01-27 MED ORDER — INSULIN ASPART 100 UNIT/ML ~~LOC~~ SOLN: 0.0000 [IU] | Freq: Every day | SUBCUTANEOUS | Status: DC

## 2017-01-27 MED ORDER — FUROSEMIDE 40 MG PO TABS
40.0000 mg | ORAL_TABLET | Freq: Two times a day (BID) | ORAL | Status: DC
  Administered 2017-01-27 – 2017-01-28 (×3): 40 mg via ORAL
  Filled 2017-01-27 (×3): qty 1

## 2017-01-27 MED ORDER — INSULIN DETEMIR 100 UNIT/ML ~~LOC~~ SOLN
30.0000 [IU] | Freq: Every day | SUBCUTANEOUS | Status: DC
  Administered 2017-01-27: 30 [IU] via SUBCUTANEOUS
  Filled 2017-01-27 (×2): qty 0.3

## 2017-01-27 MED ORDER — ACETAMINOPHEN 650 MG RE SUPP: 650.0000 mg | Freq: Four times a day (QID) | RECTAL | Status: DC | PRN

## 2017-01-27 MED ORDER — PANTOPRAZOLE SODIUM 40 MG PO TBEC
40.0000 mg | DELAYED_RELEASE_TABLET | Freq: Every day | ORAL | Status: DC
  Administered 2017-01-28: 40 mg via ORAL
  Filled 2017-01-27: qty 1

## 2017-01-27 MED ORDER — INSULIN ASPART 100 UNIT/ML ~~LOC~~ SOLN
0.0000 [IU] | Freq: Three times a day (TID) | SUBCUTANEOUS | Status: DC
  Administered 2017-01-28 (×2): 1 [IU] via SUBCUTANEOUS
  Filled 2017-01-27 (×2): qty 1

## 2017-01-27 MED ORDER — ATORVASTATIN CALCIUM 20 MG PO TABS
20.0000 mg | ORAL_TABLET | Freq: Every day | ORAL | Status: DC
  Administered 2017-01-27: 20 mg via ORAL
  Filled 2017-01-27: qty 1

## 2017-01-27 MED ORDER — IPRATROPIUM-ALBUTEROL 0.5-2.5 (3) MG/3ML IN SOLN: 3.0000 mL | Freq: Four times a day (QID) | RESPIRATORY_TRACT | Status: DC | PRN

## 2017-01-27 MED ORDER — ACETAMINOPHEN 325 MG PO TABS: 650.0000 mg | ORAL_TABLET | Freq: Four times a day (QID) | ORAL | Status: DC | PRN

## 2017-01-27 MED ORDER — INSULIN ASPART 100 UNIT/ML ~~LOC~~ SOLN
6.0000 [IU] | Freq: Three times a day (TID) | SUBCUTANEOUS | Status: DC
  Administered 2017-01-28 (×3): 6 [IU] via SUBCUTANEOUS
  Filled 2017-01-27 (×3): qty 1

## 2017-01-27 MED ORDER — DOXAZOSIN MESYLATE 4 MG PO TABS
8.0000 mg | ORAL_TABLET | Freq: Every evening | ORAL | Status: DC
  Administered 2017-01-27 – 2017-01-28 (×2): 8 mg via ORAL
  Filled 2017-01-27 (×2): qty 2

## 2017-01-27 MED ORDER — DILTIAZEM LOAD VIA INFUSION
20.0000 mg | Freq: Once | INTRAVENOUS | Status: AC
  Administered 2017-01-27: 20 mg via INTRAVENOUS
  Filled 2017-01-27: qty 20

## 2017-01-27 MED ORDER — AMIODARONE HCL 200 MG PO TABS
200.0000 mg | ORAL_TABLET | Freq: Two times a day (BID) | ORAL | Status: DC
  Administered 2017-01-27 – 2017-01-28 (×2): 200 mg via ORAL
  Filled 2017-01-27 (×2): qty 1

## 2017-01-27 MED ORDER — LIDOCAINE-PRILOCAINE 2.5-2.5 % EX CREA
1.0000 "application " | TOPICAL_CREAM | CUTANEOUS | Status: DC | PRN
  Filled 2017-01-27: qty 5

## 2017-01-27 MED ORDER — TRAMADOL HCL 50 MG PO TABS: 50.0000 mg | ORAL_TABLET | Freq: Two times a day (BID) | ORAL | Status: DC | PRN

## 2017-01-27 MED ORDER — ISOSORBIDE MONONITRATE ER 30 MG PO TB24
30.0000 mg | ORAL_TABLET | Freq: Every day | ORAL | Status: DC
  Administered 2017-01-28: 30 mg via ORAL
  Filled 2017-01-27: qty 1

## 2017-01-27 MED ORDER — HEPARIN SODIUM (PORCINE) 5000 UNIT/ML IJ SOLN
5000.0000 [IU] | Freq: Three times a day (TID) | INTRAMUSCULAR | Status: DC
  Administered 2017-01-27 – 2017-01-28 (×2): 5000 [IU] via SUBCUTANEOUS
  Filled 2017-01-27 (×2): qty 1

## 2017-01-27 MED ORDER — DILTIAZEM HCL 100 MG IV SOLR
5.0000 mg/h | INTRAVENOUS | Status: DC
  Administered 2017-01-27: 5 mg/h via INTRAVENOUS
  Filled 2017-01-27: qty 100

## 2017-01-27 MED ORDER — ASPIRIN EC 81 MG PO TBEC
81.0000 mg | DELAYED_RELEASE_TABLET | Freq: Every day | ORAL | Status: DC
  Administered 2017-01-28: 81 mg via ORAL
  Filled 2017-01-27: qty 1

## 2017-01-27 NOTE — H&P (Signed)
[place] [facility] [place] NAME: Calvin Good    MR#:  [id number]  DATE OF BIRTH:  10/26/1940  DATE OF ADMISSION:  01/27/2017  PRIMARY CARE PHYSICIAN: [name], MD   REQUESTING/REFERRING PHYSICIAN: Dr Carrie Mew  CHIEF COMPLAINT:   Chief Complaint  Patient presents with  . Chest Pain  . Shortness of Breath    HISTORY OF PRESENT ILLNESS:  Calvin Good  is a 76 y.o. male with a known history of atrial fibrillation and recurrent pleural effusion.  He states today he felt very weak when he went for dialysis.  He felt his heart pounding at dialysis.  He stated that he felt strange in his chest when his heart was going fast.  He received 2 hours and 25 minutes on dialysis and they suggested that he go to the hospital.  In the ER he was found to be in rapid atrial fibrillation and started on a Cardizem drip.  Previously he was in the hospital and had a talc pleurodesis of his right pleural effusion by Dr. Genevive Bi.  On chest x-ray today he still has a right pleural effusion.  At rehab he developed 2 sores one on his left heel and one on his buttock.  He is having some pain in his rectal area and had some rectal bleeding previously.  Hospitalist services were contacted for further evaluation.  PAST MEDICAL HISTORY:   Past Medical History:  Diagnosis Date  . Cancer [facility])    Prostate  . CHF (congestive heart failure) ([place])   . Chronic kidney disease   . Complication of anesthesia    hard to wake up  . COPD (chronic obstructive pulmonary disease) ([place])   . Diabetes mellitus without complication ([place])   . Difficult intubation   . Dyspnea   . GERD (gastroesophageal reflux disease)   . Hyperlipidemia   . Hypertension   . Pleural effusion   . Renal insufficiency     PAST SURGICAL HISTORY:   Past Surgical History:  Procedure Laterality Date  . APPENDECTOMY    . AV FISTULA PLACEMENT Left 05/30/2016   Procedure: ARTERIOVENOUS  (AV) FISTULA CREATION ( BRACHIOCEPHALIC );  Surgeon: [name], MD;  Location: [facility] ORS;  Service: Vascular;  Laterality: Left;  . CAPD INSERTION N/A 05/30/2016   Procedure: LAPAROSCOPIC INSERTION CONTINUOUS AMBULATORY PERITONEAL DIALYSIS  (CAPD) CATHETER;  Surgeon: [name], MD;  Location: [facility] ORS;  Service: Vascular;  Laterality: N/A;  . DIALYSIS/PERMA CATHETER INSERTION    . DIALYSIS/PERMA CATHETER REMOVAL N/A 08/21/2016   Procedure: Dialysis/Perma Catheter Removal;  Surgeon: [name], MD;  Location: [place] CV LAB;  Service: Cardiovascular;  Laterality: N/A;  . JOINT REPLACEMENT     right knee   . LEFT HEART CATH AND CORONARY ANGIOGRAPHY N/A 08/21/2016   Procedure: Left Heart Cath and Coronary Angiography possible PCI;  Surgeon: [name], MD;  Location: [place] CV [facility];  Service: Cardiovascular;  Laterality: N/A;  . postate removal     . PROSTATE SURGERY    . PSEUDOANERYSM COMPRESSION Right 08/27/2016   Procedure: Pseudoanerysm Compression;  Surgeon: Katha Cabal, MD;  Location: [place] CV LAB;  Service: Cardiovascular;  Laterality: Right;  . REMOVAL OF A DIALYSIS CATHETER N/A 08/08/2016   Procedure: REMOVAL OF A DIALYSIS CATHETER;  Surgeon: [name], MD;  Location: [facility] ORS;  Service: Vascular;  Laterality: N/A;  . VIDEO ASSISTED THORACOSCOPY (VATS)/THOROCOTOMY Right 01/06/2017  Procedure: PREOPERATIVE BRONCHOSCOPY, RIGHT VIDEO ASSISTED THORACOSCOPY (VATS) WITH TALC PLEURODESIS;  Surgeon: Nestor Lewandowsky, MD;  Location: [facility] ORS;  Service: General;  Laterality: Right;    SOCIAL HISTORY:   Social History  Substance Use Topics  . Smoking status: Former Smoker    Types: Cigarettes    Quit date: 05/21/1982  . Smokeless tobacco: Never Used  . Alcohol use 1.8 oz/week    3 Glasses of wine per week    FAMILY HISTORY:   Family History  Problem Relation Age of Onset  . Colon cancer Father   . Brain cancer Mother   . CAD Neg Hx   . Diabetes Neg  Hx     DRUG ALLERGIES:   Allergies  Allergen Reactions  . Tetracyclines & Related Other (See Comments)    Reaction: Unknown     REVIEW OF SYSTEMS:  CONSTITUTIONAL: No fever, chills or sweats.  Positive weight loss 30 pounds.  Positive for fatigue EYES: No blurred or double vision.  Wears glasses. EARS, NOSE, AND THROAT: No tinnitus or ear pain. No sore throat RESPIRATORY: No cough, some shortness of breath with moving around, no wheezing or hemoptysis.  CARDIOVASCULAR: No chest pain but strange feeling in his chest when his heart was beating fast.  Positive for edema.  GASTROINTESTINAL: No nausea, vomiting, diarrhea or abdominal pain.  Has seen some blood with bowel movements.  Some constipation GENITOURINARY: No dysuria, hematuria.  ENDOCRINE: No polyuria, nocturia,  HEMATOLOGY: No anemia, positive for easy bruising SKIN: No rash or lesion. MUSCULOSKELETAL: No joint pain or arthritis.   NEUROLOGIC: No tingling, numbness, weakness.  PSYCHIATRY: No anxiety or depression.   MEDICATIONS AT HOME:   Prior to Admission medications   Medication Sig Start Date End Date Taking? Authorizing Provider  acetaminophen (TYLENOL) 325 MG tablet Take 2 tablets (650 mg total) by mouth every 6 (six) hours as needed for mild pain (or Fever >/= 101). 09/23/16  Yes Gouru, Illene Silver, MD  amiodarone (PACERONE) 200 MG tablet Take 1 tablet (200 mg total) by mouth daily. 01/15/17  Yes Hillary Bow, MD  aspirin EC 81 MG tablet Take 81 mg by mouth daily.   Yes [provider]  atorvastatin (LIPITOR) 20 MG tablet Take 20 mg by mouth at bedtime.    Yes [provider]  calcium acetate (PHOSLO) 667 MG capsule Take 1,334 mg by mouth 3 (three) times daily with meals.    Yes [provider]  carvedilol (COREG) 3.125 MG tablet Take 1 tablet (3.125 mg total) by mouth 2 (two) times daily. 8 am, 5 pm 01/16/17  Yes Sudini, Alveta Heimlich, MD  doxazosin (CARDURA) 8 MG tablet Take 8 mg by mouth every  evening.    Yes [provider]  furosemide (LASIX) 40 MG tablet Take 40 mg by mouth 2 (two) times daily.    Yes [provider]  GUAIFENESIN 1200 PO Take 1,200 mg by mouth at bedtime.   Yes [provider]  indomethacin (INDOCIN) 50 MG capsule Take 50 mg by mouth 2 (two) times daily with a meal.   Yes [provider]  insulin detemir (LEVEMIR) 100 UNIT/ML injection Inject 30 Units into the skin at bedtime.   Yes [provider]  insulin lispro (HUMALOG) 100 UNIT/ML injection Inject 10 Units into the skin 3 (three) times daily before meals.   Yes [provider]  ipratropium-albuterol (DUONEB) 0.5-2.5 (3) MG/3ML SOLN Take 3 mLs by nebulization every 6 (six) hours as needed. 09/23/16  Yes  Nicholes Mango, MD  isosorbide mononitrate (IMDUR) 30 MG 24 hr tablet Take 30 mg by mouth daily.  11/28/16  Yes [provider]  Lidocaine-Prilocaine, Bulk, 2.5-2.5 % CREA Apply 1 application topically as needed. Apply to fistula site prior to dialysis treatments as needed   Yes [provider]  omeprazole (PRILOSEC) 20 MG capsule Take 20 mg by mouth 2 (two) times daily before a meal.    Yes [provider]  OXYGEN Inhale 2-5 L into the lungs continuous.    Yes [provider]  spironolactone (ALDACTONE) 25 MG tablet Take 25 mg by mouth daily.    Yes [provider]  traMADol (ULTRAM) 50 MG tablet Take 1 tablet (50 mg total) by mouth every 12 (twelve) hours as needed for moderate pain or severe pain. 01/16/17 01/16/18 Yes Toni Arthurs, NP      VITAL SIGNS:  Blood pressure 136/62, pulse 79, temperature (!) 97.2 F (36.2 C), temperature source Oral, resp. rate (!) 26, height 5\' 9"  (1.753 m), weight 96.6 kg (213 lb), SpO2 98 %.  PHYSICAL EXAMINATION:  GENERAL:  76 y.o.-year-old patient lying in the bed with no acute distress.  EYES: Pupils equal, round, reactive to light and accommodation. No scleral icterus.  Extraocular muscles intact.  HEENT: Head atraumatic, normocephalic. Oropharynx and nasopharynx clear.  NECK:  Supple, no jugular venous distention. No thyroid enlargement, no tenderness.  LUNGS: Normal breath sounds bilaterally, no wheezing, rales,rhonchi or crepitation. No use of accessory muscles of respiration.  CARDIOVASCULAR: S1, S2 normal. No murmurs, rubs, or gallops.  ABDOMEN: Soft, nontender, nondistended. Bowel sounds present. No organomegaly or mass.  EXTREMITIES: 2+ pedal edema, no cyanosis, or clubbing.  NEUROLOGIC: Cranial nerves II through XII are intact. Muscle strength 5/5 in all extremities. Sensation intact. Gait not checked.  PSYCHIATRIC: The patient is alert and oriented x 3.  SKIN: Stage II decubitus ulcer on buttock.  Hard to determine what stage the left heel is.  Likely this is a stage II ulcer but does have some black discoloration.  LABORATORY PANEL:   CBC  Recent Labs Lab 01/27/17 1518  WBC 9.6  HGB 9.7*  HCT 30.2*  PLT 155   ------------------------------------------------------------------------------------------------------------------  Chemistries   Recent Labs Lab 01/23/17 0635 01/27/17 1518  NA 135 133*  K 4.3 3.6  CL 99* 94*  CO2 29 28  GLUCOSE 154* 175*  BUN 17 21*  CREATININE 3.77* 3.58*  CALCIUM 8.4* 8.5*  AST 12*  --   ALT 12*  --   ALKPHOS 49  --   BILITOT 0.7  --    ------------------------------------------------------------------------------------------------------------------  Cardiac Enzymes  Recent Labs Lab 01/27/17 1518  TROPONINI <0.03   ------------------------------------------------------------------------------------------------------------------  RADIOLOGY:  Dg Chest 2 View  Result Date: 01/27/2017 CLINICAL DATA:  Chest pain EXAM: CHEST  2 VIEW COMPARISON:  01/13/2017 FINDINGS: Moderate right pleural effusion with probable loculated component posteriorly. Mild progression since the prior study. No left  effusion. Cardiac enlargement without edema. Bibasilar atelectasis. IMPRESSION: Moderately large right pleural effusion with loculated component. Mild progression from the prior study. Negative for edema. Electronically Signed   By: Franchot Gallo M.D.   On: 01/27/2017 16:06    EKG:   Atrial fibrillation 114 bpm, right bundle branch block, left posterior fascicular block  IMPRESSION AND PLAN:   1.  Atrial fibrillation with rapid ventricular rate.  ER physician put on a Cardizem drip which I will continue for now.  Increase amiodarone to twice daily dosing.  Also  on Coreg.  Cardiology consultation in the a.m. 2.  Recurrent right pleural effusion I will consult cardiothoracic surgery Dr. Genevive Bi in the morning to decide on whether another thoracentesis will be warranted. 3.  End-stage renal disease on hemodialysis.  Case discussed with Dr. Zollie Scale nephrology and informed him that he only received 2 hours and 25 minutes on dialysis today. 4.  Type 2 diabetes mellitus on Levemir insulin and Humalog prior to meals 5.  Hyperlipidemia unspecified on Lipitor 6.  Chronic diastolic congestive heart failure.  Since the patient does not urinate very much dialysis will be the way to remove fluid. 7.  Stage II sacral decubiti on buttock.  Local wound care.  Nursing wound consultation 8.  Left heel decubiti with some blackish discoloration.  Consider podiatry consultation when more stable.  Heel protectors while in bed. 9.  Internal hemorrhoids.  Anusol suppositories 3 times daily 10.  Chronic respiratory failure and COPD on 5 L of oxygen.  Continue nebulizer treatments. 11.  History of prostate cancer   All the records are reviewed and case discussed with ED provider. Management plans discussed with the patient, family and they are in agreement.  CODE STATUS: DNR  TOTAL TIME TAKING CARE OF THIS PATIENT: 50 minutes.    Loletha Grayer M.D on 01/27/2017 at 7:19 PM  Between 7am to 6pm - Pager -  [phone]  After 6pm call admission pager [phone]  [facility] Office  [phone]  CC: Primary care physician; [name], MD

## 2017-01-27 NOTE — ED Triage Notes (Signed)
Pt arrived via ems from dialysis for c/o chest pain and shortness of breath - pt had 2 1/2 hour of dialysis and they pulled 1 1/2 liters off prior to the chest pain starting - he has hx of afib with rvr and at this time ekg is showing same

## 2017-01-27 NOTE — ED Notes (Signed)
Attempted to call report and was advised that the nurse did not know about the pt yet and that she would need to call me back

## 2017-01-27 NOTE — Progress Notes (Signed)
Pt's hemodialysis needles were de-accessed in ED #11, daughter at bedside, access dressing is clean, dry, and intact.

## 2017-01-27 NOTE — ED Notes (Signed)
Pt family repositioned him causing him to pinch his right arm on the side rail - he refuses to allow this nurse to reposition him - convinced pt to allow this nurse to place a towel between him and the side rail to avoid pressure area

## 2017-01-27 NOTE — ED Notes (Signed)
Pharmacy emailed to send cardizem

## 2017-01-27 NOTE — ED Provider Notes (Signed)
[facility] Emergency Department Provider Note  ____________________________________________  Time seen: Approximately 5:40 PM  I have reviewed the triage vital signs and the nursing notes.   HISTORY  Chief Complaint Chest Pain and Shortness of Breath    HPI Calvin Good is a 76 y.o. male sent to the ED from dialysis due to palpitations and shortness of breath.  He was noted to be in A. fib with a heart rate of about 130.  This is happened before requiring a diltiazem drip and hospitalization.  He was started on amiodarone earlier this month from atrial fibrillation with RVR, and continued on oral amiodarone when he left the hospital.  He also reports central chest pain which is nonradiating and constant.  No aggravating or alleviating factors to his symptoms.  Moderate intensity    Past Medical History:  Diagnosis Date  . Cancer [facility])    Prostate  . CHF (congestive heart failure) ([place])   . Chronic kidney disease   . Complication of anesthesia    hard to wake up  . COPD (chronic obstructive pulmonary disease) ([place])   . Diabetes mellitus without complication ([place])   . Difficult intubation   . Dyspnea   . GERD (gastroesophageal reflux disease)   . Hyperlipidemia   . Hypertension   . Pleural effusion   . Renal insufficiency      Patient Active Problem List   Diagnosis Date Noted  . ESRD on dialysis ([place])   . Recurrent pleural effusion on right   . Atrial fibrillation ([place]) 12/30/2016  . Atrial fibrillation with RVR ([place]) 11/25/2016  . A-fib ([place]) 10/30/2016  . Allergic rhinitis due to pollen 10/29/2016  . Chronic airway obstruction ([place]) 10/29/2016  . Chronic fatigue syndrome 10/29/2016  . Dependence on renal dialysis ([place]) 10/29/2016  . Diabetic neuropathy ([place]) 10/29/2016  . Diabetic renal disease ([place]) 10/29/2016  . Heart failure ([place]) 10/29/2016  . Hyperglycemia due to type 2 diabetes mellitus ([place]) 10/29/2016  . Incontinence without  sensory awareness 10/29/2016  . Long term current use of insulin ([place]) 10/29/2016  . Obstructive sleep apnea 10/29/2016  . Urinary incontinence 10/29/2016  . Acute respiratory failure ([place]) 10/16/2016  . Anxiety 10/10/2016  . Acute encephalopathy   . HCAP (healthcare-associated pneumonia) 09/14/2016  . Tobacco use 09/03/2016  . Aneurysm ([place]) 08/28/2016  . Pseudoaneurysm following procedure ([place]) 08/26/2016  . Chest tightness 08/20/2016  . GERD without esophagitis 08/20/2016  . NSTEMI (non-ST elevated myocardial infarction) ([place]) 08/20/2016  . Cancer of prostate ([place]) 05/16/2016  . Chronic diastolic CHF (congestive heart failure) ([place]) 05/16/2016  . Diabetic polyneuropathy associated with type 2 diabetes mellitus ([place]) 05/16/2016  . Onychomycosis of toenail 05/13/2016  . Essential hypertension, benign 04/23/2016  . Hyperlipidemia 04/23/2016  . Type 2 diabetes mellitus with chronic kidney disease on chronic dialysis, with long-term current use of insulin ([name]) 04/23/2016  . ESRD (end stage renal disease) ([place]) 04/23/2016     Past Surgical History:  Procedure Laterality Date  . APPENDECTOMY    . AV FISTULA PLACEMENT Left 05/30/2016   Procedure: ARTERIOVENOUS (AV) FISTULA CREATION ( BRACHIOCEPHALIC );  Surgeon: [name], MD;  Location: [facility] ORS;  Service: Vascular;  Laterality: Left;  . CAPD INSERTION N/A 05/30/2016   Procedure: LAPAROSCOPIC INSERTION CONTINUOUS AMBULATORY PERITONEAL DIALYSIS  (CAPD) CATHETER;  Surgeon: [name], MD;  Location: [facility] ORS;  Service: Vascular;  Laterality: N/A;  . DIALYSIS/PERMA CATHETER INSERTION    . DIALYSIS/PERMA CATHETER REMOVAL N/A 08/21/2016  Procedure: Dialysis/Perma Catheter Removal;  Surgeon: [name], MD;  Location: [place] [facility];  Service: Cardiovascular;  Laterality: N/A;  . JOINT REPLACEMENT     right knee   . LEFT HEART CATH AND CORONARY ANGIOGRAPHY N/A 08/21/2016   Procedure: Left Heart Cath and Coronary Angiography  possible PCI;  Surgeon: [name], MD;  Location: [place] [facility];  Service: Cardiovascular;  Laterality: N/A;  . postate removal     . PROSTATE SURGERY    . PSEUDOANERYSM COMPRESSION Right 08/27/2016   Procedure: Pseudoanerysm Compression;  Surgeon: Katha Cabal, MD;  Location: [place] [facility];  Service: Cardiovascular;  Laterality: Right;  . REMOVAL OF A DIALYSIS CATHETER N/A 08/08/2016   Procedure: REMOVAL OF A DIALYSIS CATHETER;  Surgeon: [name], MD;  Location: [facility] ORS;  Service: Vascular;  Laterality: N/A;  . VIDEO ASSISTED THORACOSCOPY (VATS)/THOROCOTOMY Right 01/06/2017   Procedure: PREOPERATIVE BRONCHOSCOPY, RIGHT VIDEO ASSISTED THORACOSCOPY (VATS) WITH TALC PLEURODESIS;  Surgeon: Nestor Lewandowsky, MD;  Location: [facility] ORS;  Service: General;  Laterality: Right;     Prior to Admission medications   Medication Sig Start Date End Date Taking? Authorizing Provider  acetaminophen (TYLENOL) 325 MG tablet Take 2 tablets (650 mg total) by mouth every 6 (six) hours as needed for mild pain (or Fever >/= 101). 09/23/16   Nicholes Mango, MD  amiodarone (PACERONE) 200 MG tablet Take 1 tablet (200 mg total) by mouth daily. 01/15/17   Hillary Bow, MD  aspirin EC 81 MG tablet Take 81 mg by mouth daily.    [provider]  atorvastatin (LIPITOR) 20 MG tablet Take 20 mg by mouth at bedtime.     [provider]  calcium acetate (PHOSLO) 667 MG capsule Take 1,334 mg by mouth 3 (three) times daily with meals.     [provider]  carvedilol (COREG) 3.125 MG tablet Take 1 tablet (3.125 mg total) by mouth 2 (two) times daily. 8 am, 5 pm 01/16/17   Hillary Bow, MD  doxazosin (CARDURA) 8 MG tablet Take 8 mg by mouth every evening.     [provider]  furosemide (LASIX) 40 MG tablet Take 40 mg by mouth 2 (two) times daily.     [provider]  GUAIFENESIN 1200 PO Take 1,200 mg by mouth at bedtime.    [provider]   indomethacin (INDOCIN) 50 MG capsule Take 50 mg by mouth 2 (two) times daily with a meal.    [provider]  insulin detemir (LEVEMIR) 100 UNIT/ML injection Inject 30 Units into the skin at bedtime.    [provider]  insulin lispro (HUMALOG) 100 UNIT/ML injection Inject 10 Units into the skin 3 (three) times daily before meals.    [provider]  ipratropium-albuterol (DUONEB) 0.5-2.5 (3) MG/3ML SOLN Take 3 mLs by nebulization every 6 (six) hours as needed. 09/23/16   Nicholes Mango, MD  isosorbide mononitrate (IMDUR) 30 MG 24 hr tablet Take 30 mg by mouth daily.  11/28/16   [provider]  Lidocaine-Prilocaine, Bulk, 2.5-2.5 % CREA Apply 1 application topically as needed. Apply to fistula site prior to dialysis treatments as needed    [provider]  omeprazole (PRILOSEC) 20 MG capsule Take 20 mg by mouth 2 (two) times daily before a meal.     [provider]  OXYGEN Inhale 2-5 L into the lungs continuous.     [provider]  spironolactone (ALDACTONE) 25 MG tablet Take  25 mg by mouth daily.     [provider]  traMADol (ULTRAM) 50 MG tablet Take 1 tablet (50 mg total) by mouth every 12 (twelve) hours as needed for moderate pain or severe pain. 01/16/17 01/16/18  Toni Arthurs, NP     Allergies Tetracyclines & related   Family History  Problem Relation Age of Onset  . Colon cancer Father   . Brain cancer Mother   . CAD Neg Hx   . Diabetes Neg Hx     Social History Social History  Substance Use Topics  . Smoking status: Former Smoker    Types: Cigarettes    Quit date: 05/21/1982  . Smokeless tobacco: Never Used  . Alcohol use 1.8 oz/week    3 Glasses of wine per week    Review of Systems  Constitutional:   No fever or chills.  ENT:   No sore throat. No rhinorrhea. Cardiovascular:   Positive chest pain without syncope. Respiratory:   Positive shortness of breath without cough. Gastrointestinal:    Negative for abdominal pain, vomiting and diarrhea.  Musculoskeletal:   Negative for focal pain or swelling All other systems reviewed and are negative except as documented above in ROS and HPI.  ____________________________________________   PHYSICAL EXAM:  VITAL SIGNS: ED Triage Vitals  Enc Vitals Group     BP 01/27/17 1514 (!) 175/72     Pulse Rate 01/27/17 1514 (!) 123     Resp 01/27/17 1514 (!) 21     Temp 01/27/17 1514 (!) 97.2 F (36.2 C)     Temp Source 01/27/17 1514 Oral     SpO2 01/27/17 1511 94 %     Weight 01/27/17 1516 213 lb (96.6 kg)     Height 01/27/17 1516 5\' 9"  (1.753 m)     Head Circumference --      Peak Flow --      Pain Score 01/27/17 1513 5     Pain Loc --      Pain Edu? --      Excl. in [place]? --     Vital signs reviewed, nursing assessments reviewed.   Constitutional:   Alert and oriented.  Ill-appearing, mild distress Eyes:   No scleral icterus.  EOMI. No nystagmus. No conjunctival pallor. PERRL. ENT   Head:   Normocephalic and atraumatic.   Nose:   No congestion/rhinnorhea.    Mouth/Throat:   MMM, no pharyngeal erythema. No peritonsillar mass.    Neck:   No meningismus. Full ROM. Hematological/Lymphatic/Immunilogical:   No cervical lymphadenopathy. Cardiovascular:   Irregularly irregular rhythm, tachycardia heart rate 130. Symmetric bilateral radial and DP pulses.  No murmurs.  Respiratory:   Tachypnea, diminished breath sounds on the right.. Gastrointestinal:   Soft and nontender. Non distended. There is no CVA tenderness.  No rebound, rigidity, or guarding. Genitourinary:   deferred Musculoskeletal:   Normal range of motion in all extremities. No joint effusions.  No lower extremity tenderness.  No edema. Neurologic:   Normal speech and language.  Motor grossly intact. No gross focal neurologic deficits are appreciated.  Skin:    Skin is warm, dry and intact.  There is scattered ecchymosis along the waistline where his pants  pressed up against his pannus.  Not consistent with Damian Leavell sign or [place] sign ____________________________________________    LABS (pertinent positives/negatives) (all labs ordered are listed, but only abnormal results are displayed) Labs Reviewed  BASIC METABOLIC PANEL - Abnormal; Notable for the following:  Result Value   Sodium 133 (*)    Chloride 94 (*)    Glucose, Bld 175 (*)    BUN 21 (*)    Creatinine, Ser 3.58 (*)    Calcium 8.5 (*)    GFR calc non Af Amer 15 (*)    GFR calc Af Amer 17 (*)    All other components within normal limits  CBC - Abnormal; Notable for the following:    RBC 3.44 (*)    Hemoglobin 9.7 (*)    HCT 30.2 (*)    RDW 15.6 (*)    All other components within normal limits  TROPONIN I   ____________________________________________   EKG  Interpreted by me Atrial fibrillation rate 114, right axis, right bundle branch block.  Unchanged from December 30, 2016.  No apparent ischemic changes.  ____________________________________________    [id number]  Dg Chest 2 View  Result Date: 01/27/2017 CLINICAL DATA:  Chest pain EXAM: CHEST  2 VIEW COMPARISON:  01/13/2017 FINDINGS: Moderate right pleural effusion with probable loculated component posteriorly. Mild progression since the prior study. No left effusion. Cardiac enlargement without edema. Bibasilar atelectasis. IMPRESSION: Moderately large right pleural effusion with loculated component. Mild progression from the prior study. Negative for edema. Electronically Signed   By: Franchot Gallo M.D.   On: 01/27/2017 16:06    ____________________________________________   PROCEDURES Procedures CRITICAL CARE Performed by: Joni Fears, Deaira Leckey   Total critical care time: 35 minutes  Critical care time was exclusive of separately billable procedures and treating other patients.  Critical care was necessary to treat or prevent imminent or life-threatening deterioration.  Critical care was  time spent personally by me on the following activities: development of treatment plan with patient and/or surrogate as well as nursing, discussions with consultants, evaluation of patient's response to treatment, examination of patient, obtaining history from patient or surrogate, ordering and performing treatments and interventions, ordering and review of laboratory studies, ordering and review of radiographic studies, pulse oximetry and re-evaluation of patient's condition.  ____________________________________________   DIFFERENTIAL DIAGNOSIS  Atrial fibrillation with RVR, pulmonary edema, pneumothorax, pneumonia, pulmonary embolism, dehydration  CLINICAL IMPRESSION / ASSESSMENT AND PLAN / ED COURSE  Pertinent labs & imaging results that were available during my care of the patient were reviewed by me and considered in my medical decision making (see chart for details).   Patient presents with A. fib with rapid ventricular response, possibly due to dehydration or electrolyte shifts related to dialysis.  He has been taking amiodarone and seemed to be controlled over the last few weeks.  X-ray does show that he has a large right pleural effusion again despite having a talc pleurodesis during his last hospitalization.  This is likely contributing to his symptoms as well.  Started a diltiazem drip for rate control, will plan to discuss with the hospitalist for further management.  Clinical Course as of Jan 28 1815  Mon Jan 27, 2017  1814 Pt feeling better currently with HR controlled to 80-100 on dilt 5mg /hr.  Continue infusion, case d/w hospitalist for admission and further eval of afib and pleural effusion.   [PS]    Clinical Course User Index [PS] Carrie Mew, MD     ____________________________________________   FINAL CLINICAL IMPRESSION(S) / ED DIAGNOSES    Final diagnoses:  Pleural effusion, right  Atrial fibrillation with rapid ventricular response (HCC)  Dyspnea on  exertion      New Prescriptions   No medications on file     Portions  of this note were generated with dragon dictation software. Dictation errors may occur despite best attempts at proofreading.    Carrie Mew, MD 01/27/17 1816

## 2017-01-28 ENCOUNTER — Inpatient Hospital Stay: Payer: Medicare Other

## 2017-01-28 DIAGNOSIS — L8990 Pressure ulcer of unspecified site, unspecified stage: Secondary | ICD-10-CM | POA: Insufficient documentation

## 2017-01-28 DIAGNOSIS — I482 Chronic atrial fibrillation: Secondary | ICD-10-CM | POA: Diagnosis not present

## 2017-01-28 LAB — GLUCOSE, CAPILLARY
GLUCOSE-CAPILLARY: 136 mg/dL — AB (ref 65–99)
GLUCOSE-CAPILLARY: 133 mg/dL — AB (ref 65–99)
Glucose-Capillary: 113 mg/dL — ABNORMAL HIGH (ref 65–99)

## 2017-01-28 LAB — CBC
HEMATOCRIT: 26.2 % — AB (ref 40.0–52.0)
HEMOGLOBIN: 8.4 g/dL — AB (ref 13.0–18.0)
MCH: 27.7 pg (ref 26.0–34.0)
MCHC: 32 g/dL (ref 32.0–36.0)
MCV: 86.7 fL (ref 80.0–100.0)
Platelets: 143 10*3/uL — ABNORMAL LOW (ref 150–440)
RBC: 3.02 MIL/uL — AB (ref 4.40–5.90)
RDW: 15.5 % — ABNORMAL HIGH (ref 11.5–14.5)
WBC: 6.3 10*3/uL (ref 3.8–10.6)

## 2017-01-28 LAB — BASIC METABOLIC PANEL
ANION GAP: 8 (ref 5–15)
BUN: 28 mg/dL — ABNORMAL HIGH (ref 6–20)
CO2: 31 mmol/L (ref 22–32)
Calcium: 8 mg/dL — ABNORMAL LOW (ref 8.9–10.3)
Chloride: 95 mmol/L — ABNORMAL LOW (ref 101–111)
Creatinine, Ser: 5.1 mg/dL — ABNORMAL HIGH (ref 0.61–1.24)
GFR, EST AFRICAN AMERICAN: 11 mL/min — AB (ref >60)
GFR, EST NON AFRICAN AMERICAN: 10 mL/min — AB (ref >60)
Glucose, Bld: 178 mg/dL — ABNORMAL HIGH (ref 65–99)
POTASSIUM: 3.8 mmol/L (ref 3.5–5.1)
Sodium: 134 mmol/L — ABNORMAL LOW (ref 135–145)

## 2017-01-28 MED ORDER — COLLAGENASE 250 UNIT/GM EX OINT: TOPICAL_OINTMENT | Freq: Every day | CUTANEOUS | 0 refills | Status: DC

## 2017-01-28 MED ORDER — COLLAGENASE 250 UNIT/GM EX OINT
TOPICAL_OINTMENT | Freq: Every day | CUTANEOUS | Status: DC
  Administered 2017-01-28: 12:00:00 via TOPICAL
  Filled 2017-01-28: qty 30

## 2017-01-28 MED ORDER — GERHARDT'S BUTT CREAM
TOPICAL_CREAM | Freq: Two times a day (BID) | CUTANEOUS | Status: DC
  Administered 2017-01-28: 12:00:00 via TOPICAL
  Filled 2017-01-28: qty 1

## 2017-01-28 MED ORDER — GERHARDT'S BUTT CREAM: 1.0000 "application " | TOPICAL_CREAM | Freq: Two times a day (BID) | CUTANEOUS | 1 refills | Status: DC

## 2017-01-28 NOTE — Care Management (Signed)
CM was contacted and informed information about referral to [facility] was in error.  Patient is currently open to [facility]. Agency informed of admission and will resume care at discharge.

## 2017-01-28 NOTE — Consult Note (Signed)
[facility] Consultation Note  Patient ID: Calvin Good, MRN: [id number], DOB/AGE: 78-28-1942 78 y.o. Admit date: 01/27/2017   Date of Consult: 01/28/2017 Primary Physician: [name], MD Primary Cardiologist: [facility]  Chief Complaint:  Chief Complaint  Patient presents with  . Chest Pain  . Shortness of Breath   Reason for Consult: atrial fibrillation  HPI: 76 y.o. male with known paroxysmal nonvalvular atrial fibrillation for which the patient has had a recent hospitalization due to significant congestive heart failure and pleural effusions and known systolic ejection fraction of 30%. The patient had coronary artery disease as well with minimal elevation of troponin for which was demand ischemia. The patient then continued on dialysis but was still having recurrent pleural effusion for which the patient now has had a pleurodesis. This is cause some other discomfort and the patient had a rapid heart rate while at dialysis. It was concerning that the rapid heart rate was recurrence of atrial fibrillation although there is no EKG or telemetry showing atrial fibrillation it is noted that he had rapid ventricular rate. Current telemetry shows sinus tachycardia and normal sinus rhythm with bundle branch block. The patient has had some improvements of these symptoms and currently there is no evidence of myocardial infarction. The patient has remained on appropriate medication management for heart rate control including amiodarone and carvedilol  Past Medical History:  Diagnosis Date  . Cancer [facility])    Prostate  . CHF (congestive heart failure) ([place])   . Chronic kidney disease   . Complication of anesthesia    hard to wake up  . COPD (chronic obstructive pulmonary disease) ([place])   . Diabetes mellitus without complication ([place])   . Difficult intubation   . Dyspnea   . GERD (gastroesophageal reflux disease)   . Hyperlipidemia   . Hypertension   . Pleural effusion    . Renal insufficiency       Surgical History:  Past Surgical History:  Procedure Laterality Date  . APPENDECTOMY    . AV FISTULA PLACEMENT Left 05/30/2016   Procedure: ARTERIOVENOUS (AV) FISTULA CREATION ( BRACHIOCEPHALIC );  Surgeon: [name], MD;  Location: [facility] ORS;  Service: Vascular;  Laterality: Left;  . CAPD INSERTION N/A 05/30/2016   Procedure: LAPAROSCOPIC INSERTION CONTINUOUS AMBULATORY PERITONEAL DIALYSIS  (CAPD) CATHETER;  Surgeon: [name], MD;  Location: [facility] ORS;  Service: Vascular;  Laterality: N/A;  . DIALYSIS/PERMA CATHETER INSERTION    . DIALYSIS/PERMA CATHETER REMOVAL N/A 08/21/2016   Procedure: Dialysis/Perma Catheter Removal;  Surgeon: [name], MD;  Location: [facility];  Service: Cardiovascular;  Laterality: N/A;  . JOINT REPLACEMENT     right knee   . LEFT HEART CATH AND CORONARY ANGIOGRAPHY N/A 08/21/2016   Procedure: Left Heart Cath and Coronary Angiography possible PCI;  Surgeon: [name], MD;  Location: [facility];  Service: Cardiovascular;  Laterality: N/A;  . postate removal     . PROSTATE SURGERY    . PSEUDOANERYSM COMPRESSION Right 08/27/2016   Procedure: Pseudoanerysm Compression;  Surgeon: Katha Cabal, MD;  Location: [facility];  Service: Cardiovascular;  Laterality: Right;  . REMOVAL OF A DIALYSIS CATHETER N/A 08/08/2016   Procedure: REMOVAL OF A DIALYSIS CATHETER;  Surgeon: [name], MD;  Location: [facility] ORS;  Service: Vascular;  Laterality: N/A;  . VIDEO ASSISTED THORACOSCOPY (VATS)/THOROCOTOMY Right 01/06/2017   Procedure: PREOPERATIVE BRONCHOSCOPY, RIGHT VIDEO ASSISTED THORACOSCOPY (VATS) WITH TALC PLEURODESIS;  Surgeon: Genevive Bi,  Christia Reading, MD;  Location: [facility] ORS;  Service: General;  Laterality: Right;     Home Meds: Prior to Admission medications   Medication Sig Start Date End Date Taking? Authorizing Provider  acetaminophen (TYLENOL) 325 MG tablet Take 2 tablets (650 mg total) by mouth every 6  (six) hours as needed for mild pain (or Fever >/= 101). 09/23/16  Yes Gouru, Illene Silver, MD  amiodarone (PACERONE) 200 MG tablet Take 1 tablet (200 mg total) by mouth daily. 01/15/17  Yes Hillary Bow, MD  aspirin EC 81 MG tablet Take 81 mg by mouth daily.   Yes [provider]  atorvastatin (LIPITOR) 20 MG tablet Take 20 mg by mouth at bedtime.    Yes [provider]  calcium acetate (PHOSLO) 667 MG capsule Take 1,334 mg by mouth 3 (three) times daily with meals.    Yes [provider]  carvedilol (COREG) 3.125 MG tablet Take 1 tablet (3.125 mg total) by mouth 2 (two) times daily. 8 am, 5 pm 01/16/17  Yes Sudini, Alveta Heimlich, MD  doxazosin (CARDURA) 8 MG tablet Take 8 mg by mouth every evening.    Yes [provider]  furosemide (LASIX) 40 MG tablet Take 40 mg by mouth 2 (two) times daily.    Yes [provider]  GUAIFENESIN 1200 PO Take 1,200 mg by mouth at bedtime.   Yes [provider]  indomethacin (INDOCIN) 50 MG capsule Take 50 mg by mouth 2 (two) times daily with a meal.   Yes [provider]  insulin detemir (LEVEMIR) 100 UNIT/ML injection Inject 30 Units into the skin at bedtime.   Yes [provider]  insulin lispro (HUMALOG) 100 UNIT/ML injection Inject 10 Units into the skin 3 (three) times daily before meals.   Yes [provider]  ipratropium-albuterol (DUONEB) 0.5-2.5 (3) MG/3ML SOLN Take 3 mLs by nebulization every 6 (six) hours as needed. 09/23/16  Yes Gouru, Illene Silver, MD  isosorbide mononitrate (IMDUR) 30 MG 24 hr tablet Take 30 mg by mouth daily.  11/28/16  Yes [provider]  Lidocaine-Prilocaine, Bulk, 2.5-2.5 % CREA Apply 1 application topically as needed. Apply to fistula site prior to dialysis treatments as needed   Yes [provider]  omeprazole (PRILOSEC) 20 MG capsule Take 20 mg by mouth 2 (two) times daily before a meal.    Yes [provider]  OXYGEN Inhale 2-5 L into the  lungs continuous.    Yes [provider]  spironolactone (ALDACTONE) 25 MG tablet Take 25 mg by mouth daily.    Yes [provider]  traMADol (ULTRAM) 50 MG tablet Take 1 tablet (50 mg total) by mouth every 12 (twelve) hours as needed for moderate pain or severe pain. 01/16/17 01/16/18 Yes Toni Arthurs, NP    Inpatient Medications:  . amiodarone  200 mg Oral BID  . atorvastatin  20 mg Oral QHS  . calcium acetate  1,334 mg Oral TID WC  . carvedilol  3.125 mg Oral BID  . collagenase   Topical Daily  . doxazosin  8 mg Oral QPM  . furosemide  40 mg Oral BID  . Gerhardt's butt cream   Topical BID  . heparin  5,000 Units Subcutaneous Q8H  . hydrocortisone  25 mg Rectal TID  . insulin aspart  0-5 Units Subcutaneous QHS  . insulin aspart  0-9 Units Subcutaneous TID WC  . insulin aspart  6 Units Subcutaneous TID WC  . insulin detemir  30 Units Subcutaneous QHS  .  isosorbide mononitrate  30 mg Oral Daily  . pantoprazole  40 mg Oral Daily     Allergies:  Allergies  Allergen Reactions  . Tetracyclines & Related Other (See Comments)    Reaction: Unknown     Social History   Social History  . Marital status: Widowed    Spouse name: N/A  . Number of children: N/A  . Years of education: N/A   Occupational History  . retired    Social History Main Topics  . Smoking status: Former Smoker    Types: Cigarettes    Quit date: 05/21/1982  . Smokeless tobacco: Never Used  . Alcohol use 1.8 oz/week    3 Glasses of wine per week  . Drug use: No  . Sexual activity: No   Other Topics Concern  . Not on file   Social History Narrative  . No narrative on file     Family History  Problem Relation Age of Onset  . Colon cancer Father   . Brain cancer Mother   . CAD Neg Hx   . Diabetes Neg Hx      Review of Systems Positive for palpitations shortness of breath tachycardia Negative for: General:  chills, fever, night sweats or weight changes.  Cardiovascular:  PND orthopnea syncope dizziness  Dermatological skin lesions rashes Respiratory: Cough congestion Urologic: Frequent urination urination at night and hematuria Abdominal: negative for nausea, vomiting, diarrhea, bright red blood per rectum, melena, or hematemesis Neurologic: negative for visual changes, and/or hearing changes  All other systems reviewed and are otherwise negative except as noted above.  Labs:  Recent Labs  01/27/17 1518  TROPONINI <0.03   Lab Results  Component Value Date   WBC 6.3 01/28/2017   HGB 8.4 (L) 01/28/2017   HCT 26.2 (L) 01/28/2017   MCV 86.7 01/28/2017   PLT 143 (L) 01/28/2017    Recent Labs Lab 01/23/17 0635  01/28/17 0431  NA 135  < > 134*  K 4.3  < > 3.8  CL 99*  < > 95*  CO2 29  < > 31  BUN 17  < > 28*  CREATININE 3.77*  < > 5.10*  CALCIUM 8.4*  < > 8.0*  PROT 5.6*  --   --   BILITOT 0.7  --   --   ALKPHOS 49  --   --   ALT 12*  --   --   AST 12*  --   --   GLUCOSE 154*  < > 178*  < > = values in this interval not displayed. Lab Results  Component Value Date   CHOL 160 12/30/2016   HDL 29 (L) 12/30/2016   LDLCALC 89 12/30/2016   TRIG 211 (H) 12/30/2016   No results found for: DDIMER  Radiology/Studies:  Dg Chest 1 View  Result Date: 01/02/2017 CLINICAL DATA:  Pneumonia.  Cough and congestion. EXAM: CHEST 1 VIEW COMPARISON:  01/01/2017 FINDINGS: Persistent densities at the right lung base with volume loss. No evidence for pulmonary edema. Heart size is upper limits of normal but unchanged. Mild blunting at the left costophrenic angle. Atherosclerotic calcifications at the aortic arch. Trachea is midline. IMPRESSION: Persistent densities and volume loss at the right lung base. Findings may represent a combination of consolidation and pleural fluid. Suspect a small left pleural effusion. Electronically Signed   By: Markus Daft M.D.   On: 01/02/2017 10:49   Dg Chest 2 View  Result Date: 01/27/2017 CLINICAL DATA:  Chest pain  EXAM:  CHEST  2 VIEW COMPARISON:  01/13/2017 FINDINGS: Moderate right pleural effusion with probable loculated component posteriorly. Mild progression since the prior study. No left effusion. Cardiac enlargement without edema. Bibasilar atelectasis. IMPRESSION: Moderately large right pleural effusion with loculated component. Mild progression from the prior study. Negative for edema. Electronically Signed   By: Franchot Gallo M.D.   On: 01/27/2017 16:06   Dg Chest 2 View  Result Date: 01/13/2017 CLINICAL DATA:  Recurrent right pleural effusion. Atrial fibrillation. End-stage renal disease on dialysis. Chest tube placement. EXAM: CHEST  2 VIEW COMPARISON:  01/07/2017 FINDINGS: Right chest tube remains in place. Small right pleural effusion has decreased in size since previous study. No pneumothorax visualized. Decrease in bibasilar atelectasis also demonstrated since prior exam. Mild cardiomegaly remains stable. Aortic atherosclerosis. IMPRESSION: Decreased size of small right pleural effusion and bibasilar atelectasis. No pneumothorax visualized. Electronically Signed   By: Earle Gell M.D.   On: 01/13/2017 07:17   Dg Chest 2 View  Result Date: 01/06/2017 CLINICAL DATA:  Pneumonia, prostate cancer. EXAM: CHEST  2 VIEW COMPARISON:  01/02/2017 and PET 10/22/2016.  CT chest 10/17/2016. FINDINGS: Trachea is midline. Heart is mildly enlarged. Thoracic aorta is calcified. Mild initial prominence with bibasilar airspace opacification, right greater than left. Small right pleural effusion. Tiny left pleural effusion. IMPRESSION: 1. Mild CHF. 2. Right lower lobe collapse/consolidation may be due to pneumonia. Overall aeration at the right lung base is slightly improved from 01/02/2017. 3. Left lower lobe atelectasis. 4.  Aortic atherosclerosis (ICD10-170.0). Electronically Signed   By: Lorin Picket M.D.   On: 01/06/2017 07:43   Dg Chest 2 View  Result Date: 01/01/2017 CLINICAL DATA:  Cough and weakness today. EXAM:  CHEST  2 VIEW COMPARISON:  12/30/2016, 12/04/2016 and 09/15/2016 FINDINGS: Lungs are adequately inflated demonstrate persistent opacification over the right mid to lower lung compatible with moderate size effusion likely with associated basilar atelectasis. Cannot exclude infection in the right base. Mild heterogeneous opacification in the left base/ retrocardiac region with nodularity. Small amount left pleural fluid seen posteriorly on the lateral film. Stable cardiomegaly. Calcified plaque over the aortic arch. Degenerative change of the spine. IMPRESSION: Moderate size right pleural effusion likely with associated basilar atelectasis. Small amount left pleural fluid. Mild heterogeneous opacification with nodularity over the left base/retrocardiac region as cannot exclude infection in the lung bases. Stable cardiomegaly. Aortic Atherosclerosis (ICD10-I70.0). Electronically Signed   By: Marin Olp M.D.   On: 01/01/2017 20:35   Dg Chest Port 1 View  Result Date: 01/07/2017 CLINICAL DATA:  Respiratory failure. EXAM: PORTABLE CHEST 1 VIEW COMPARISON:  01/06/2017. FINDINGS: Tubing noted over the right chest with tip over the lower right chest as previously noted in unchanged position. This may represent a chest tube. Improvement of mediastinal prominence. Stable cardiomegaly. Progressive bilateral lower lobe atelectasis. Left base infiltrate may be present. Small left pleural effusion may be present. No pneumothorax. Degenerative changes thoracic spine. IMPRESSION: 1. Tubing noted over the right chest with tip over the lower right chest as previously noted is in unchanged position. This may represent a chest tube. No pneumothorax. 2.  Interim improvement of mediastinal prominence. 3. Progressive bilateral lower lobe atelectasis. Left base infiltrate cannot be excluded. Small left pleural effusion cannot be excluded. 4. Stable cardiomegaly. Electronically Signed   By: Marcello Moores  Register   On: 01/07/2017 06:17    Dg Chest Port 1 View  Result Date: 01/06/2017 CLINICAL DATA:  Postoperative radiograph. EXAM: PORTABLE CHEST 1 VIEW  COMPARISON:  01/06/2017 FINDINGS: Right-sided catheter loops over the lower thorax, tip in uncertain location. Cardiomediastinal silhouette is stably enlarged. Mediastinal contour is enlarged, which may be exaggerated by portable AP technique. Calcific atherosclerotic disease of the aorta. Bilateral lower lobe airspace consolidation. Osseous structures are without acute abnormality. Soft tissues are grossly normal. IMPRESSION: Right-sided catheter with tip overlying right lower thorax/ upper abdomen, in uncertain location. Apparent enlargement of the mediastinum, query postsurgical. Please correlate clinically. Bilateral lower lobe airspace consolidation. Electronically Signed   By: Fidela Salisbury M.D.   On: 01/06/2017 16:17   Dg Chest Portable 1 View  Result Date: 12/30/2016 CLINICAL DATA:  Chest pain and shortness of breath. Uncontrolled atrial fibrillation. Recent dialysis. EXAM: PORTABLE CHEST 1 VIEW COMPARISON:  12/05/2016 FINDINGS: Stable mild cardiomegaly.  Aortic atherosclerosis. Increased size of small right pleural effusion. No evidence of pulmonary consolidation or frank pulmonary edema. IMPRESSION: Increased size of small right pleural effusion. Stable mild cardiomegaly. Electronically Signed   By: Earle Gell M.D.   On: 12/30/2016 16:53    EKG: Normal sinus rhythm with left bundle-branch block  Weights: Filed Weights   01/27/17 1516 01/27/17 2051 01/28/17 0416  Weight: 96.6 kg (213 lb) 97.1 kg (214 lb 1.6 oz) 97.3 kg (214 lb 6.4 oz)     Physical Exam: Blood pressure (!) 144/42, pulse 71, temperature 98.2 F (36.8 C), temperature source Oral, resp. rate 18, height 5\' 8"  (1.727 m), weight 97.3 kg (214 lb 6.4 oz), SpO2 100 %. Body mass index is 32.6 kg/m. General: Well developed, well nourished, in no acute distress. Head eyes ears nose throat: Normocephalic,  atraumatic, sclera non-icteric, no xanthomas, nares are without discharge. No apparent thyromegaly and/or mass  Lungs: Normal respiratory effort.  Some wheezes, no rales, no rhonchi. With decreased breath sounds in the bases Heart: RRR with normal S1 S2. 2+ aortic murmur gallop, no rub, PMI is normal size and placement, carotid upstroke normal without bruit, jugular venous pressure is normal Abdomen: Soft, non-tender, non-distended with normoactive bowel sounds. No hepatomegaly. No rebound/guarding. No obvious abdominal masses. Abdominal aorta is normal size without bruit Extremities: Trace to 1+ edema. no cyanosis, no clubbing, no ulcers  Peripheral : 2+ bilateral upper extremity pulses, 2+ bilateral femoral pulses, 2+ bilateral dorsal pedal pulse Neuro: Alert and oriented. No facial asymmetry. No focal deficit. Moves all extremities spontaneously. Musculoskeletal: Normal muscle tone without kyphosis Psych:  Responds to questions appropriately with a normal affect.    Assessment: 76 year old male with paroxysmal nonvalvular atrial fibrillation with rapid ventricular rate now back in normal rhythm with appropriate medication management and chronic systolic dysfunction congestive heart failure net is status post pleural effusion treatment  Plan: 1. Continue carvedilol and amiodarone for continued of maintenance of normal sinus rhythm paroxysmal nonvalvular atrial fibrillation 2. Continue carvedilol for treatment of systolic dysfunction congestive heart failure 3. No further cardiac intervention or diagnostics necessary at this time due to now maintenance of normal sinus rhythm and no further symptoms suggesting a cardiac abnormality 4. Supportive care status post pleurodesis 5. In ambulation and follow for improvements and further treatment options as an outpatient  Signed, Corey Skains M.D. [facility] 01/28/2017, 1:11 PM

## 2017-01-28 NOTE — Plan of Care (Signed)
Problem: Cardiac: Goal: Ability to achieve and maintain adequate cardiopulmonary perfusion will improve Outcome: Progressing Pt remains in NSR HR in 70's

## 2017-01-28 NOTE — Progress Notes (Signed)
Please note patient is followed by Out Patient Palliative. He has been seen both at home and after his last discharge at [facility]. CMRN Joni Reining made aware. Thank you. Flo Shanks RN, BSN, [place] [facility] hospital Liaison [phone] c

## 2017-01-28 NOTE — Progress Notes (Signed)
Calvin Good to be D/C'd Home per MD order.  Discussed prescriptions and follow up appointments with the patient. Prescriptions given to patient, medication list explained in detail. Pt verbalized understanding.  Allergies as of 01/28/2017      Reactions   Tetracyclines & Related Other (See Comments)   Reaction: Unknown      Medication List    TAKE these medications   acetaminophen 325 MG tablet Commonly known as:  TYLENOL Take 2 tablets (650 mg total) by mouth every 6 (six) hours as needed for mild pain (or Fever >/= 101).   amiodarone 200 MG tablet Commonly known as:  PACERONE Take 1 tablet (200 mg total) by mouth daily.   aspirin EC 81 MG tablet Take 81 mg by mouth daily.   atorvastatin 20 MG tablet Commonly known as:  LIPITOR Take 20 mg by mouth at bedtime.   calcium acetate 667 MG capsule Commonly known as:  PHOSLO Take 1,334 mg by mouth 3 (three) times daily with meals.   carvedilol 3.125 MG tablet Commonly known as:  COREG Take 1 tablet (3.125 mg total) by mouth 2 (two) times daily. 8 am, 5 pm   collagenase ointment Commonly known as:  SANTYL Apply topically daily.   doxazosin 8 MG tablet Commonly known as:  CARDURA Take 8 mg by mouth every evening.   furosemide 40 MG tablet Commonly known as:  LASIX Take 40 mg by mouth 2 (two) times daily.   Gerhardt's butt cream Crea Apply 1 application topically 2 (two) times daily.   GUAIFENESIN 1200 PO Take 1,200 mg by mouth at bedtime.   indomethacin 50 MG capsule Commonly known as:  INDOCIN Take 50 mg by mouth 2 (two) times daily with a meal.   insulin detemir 100 UNIT/ML injection Commonly known as:  LEVEMIR Inject 30 Units into the skin at bedtime.   insulin lispro 100 UNIT/ML injection Commonly known as:  HUMALOG Inject 10 Units into the skin 3 (three) times daily before meals.   ipratropium-albuterol 0.5-2.5 (3) MG/3ML Soln Commonly known as:  DUONEB Take 3 mLs by nebulization every 6 (six) hours as  needed.   isosorbide mononitrate 30 MG 24 hr tablet Commonly known as:  IMDUR Take 30 mg by mouth daily.   Lidocaine-Prilocaine (Bulk) 2.5-2.5 % Crea Apply 1 application topically as needed. Apply to fistula site prior to dialysis treatments as needed   omeprazole 20 MG capsule Commonly known as:  PRILOSEC Take 20 mg by mouth 2 (two) times daily before a meal.   OXYGEN Inhale 2-5 L into the lungs continuous.   spironolactone 25 MG tablet Commonly known as:  ALDACTONE Take 25 mg by mouth daily.   traMADol 50 MG tablet Commonly known as:  ULTRAM Take 1 tablet (50 mg total) by mouth every 12 (twelve) hours as needed for moderate pain or severe pain.       Vitals:   01/28/17 1602 01/28/17 1719  BP: (!) 201/85 (!) 151/42  Pulse: 75 71  Resp: 18 18  Temp:  97.9 F (36.6 C)  SpO2: 98% (!) 85%    Skin clean, dry and intact without evidence of skin break down, no evidence of skin tears noted. IV catheter discontinued intact. Site without signs and symptoms of complications. Dressing and pressure applied. Tele box removed and returned. Pt denies pain at this time. No complaints noted.  An After Visit Summary was printed and given to the patient. Patient escorted via [place], and D/C home via private  [employer]

## 2017-01-28 NOTE — Care Management Note (Addendum)
Case Management Note  Patient Details  Name: Calvin Good MRN: [id number] Date of Birth: 10/26/1940  Subjective/Objective:              Patient had a recent skilled nursing stay at [facility] and has been home "about one week."   he does not know if any home health services were set up.Patient was at dialysis and after about tow and one  half hours, developed chest pain.  In the ed was found to be in rapid atrial fib and placed on cardizem drip. Had talc pleurodesis last admission for right pleural effusion.  Chronic dialysis for ERSD.  Chronic home oxygen   Action/Plan:  Notified Elvera Bicker with [facility] [employer] and contacted SW at [facility]   Expected Discharge Date:                  Expected Discharge Plan:     In-House Referral:     Discharge planning Services     Post Acute Care Choice:    Choice offered to:     DME Arranged:    DME Agency:     HH Arranged:    [place] Agency:     Status of Service:     If discussed at [employer], dates discussed:    Additional Comments:  Katrina Stack, RN 01/28/2017, 10:22 AM

## 2017-01-28 NOTE — Discharge Summary (Signed)
[facility] NAME: Calvin Good    MR#:  [id number]  DATE OF BIRTH:  [birth year]  DATE OF ADMISSION:  01/27/2017 ADMITTING PHYSICIAN: Loletha Grayer, MD  DATE OF DISCHARGE: 01/28/17  PRIMARY CARE PHYSICIAN: [name], MD    ADMISSION DIAGNOSIS:  Dyspnea on exertion [R06.09] Pleural effusion, right [J90] Atrial fibrillation with rapid ventricular response (HCC) [I48.91]  DISCHARGE DIAGNOSIS:  Acute on chronic atrial fibrillation with RVR--- patient now in sinus rhythm Recurrent right pleural effusion--therapeutic thoracentesis today Chronic systolic congestive heart failure COPD on home oxygen, chronic  SECONDARY DIAGNOSIS:   Past Medical History:  Diagnosis Date  . Cancer [facility])    Prostate  . CHF (congestive heart failure) ([place])   . Chronic kidney disease   . Complication of anesthesia    hard to wake up  . COPD (chronic obstructive pulmonary disease) ([place])   . Diabetes mellitus without complication ([place])   . Difficult intubation   . Dyspnea   . GERD (gastroesophageal reflux disease)   . Hyperlipidemia   . Hypertension   . Pleural effusion   . Renal insufficiency     HOSPITAL COURSE:  Calvin Good  is a 76 y.o. male with a known history of atrial fibrillation and recurrent pleural effusion.  He states today he felt very weak when he went for dialysis.  He felt his heart pounding at dialysis.  He stated that he felt strange in his chest when his heart was going fast.   In the ER he was found to be in rapid atrial fibrillation and started on a Cardizem drip.  Previously he was in the hospital and had a talc pleurodesis of his right pleural effusion by Dr. Genevive Bi.    1.  Atrial fibrillation with rapid ventricular rate. - patient was started on IV Cardizem drip with continued.  He is in sinus rhythm.   -Continue Coreg and amiodarone as home dosing.   -Patient was seen by Dr. Nehemiah Massed no new  recommendations received.  2.  Recurrent right pleural effusion -I discussed with cardiothoracic surgery Dr. Genevive Bi and he reviewed the chest x-ray and okay with IR doing therapeutic thoracentesis.  Patient is agreeable.    3.  End-stage renal disease on hemodialysis.   -Allergy consultation placed  4.  Type 2 diabetes mellitus on Levemir insulin and Humalog prior to meals  5.  Hyperlipidemia unspecified on Lipitor  6.  Chronic diastolic congestive heart failure.  Since the patient does not urinate very much dialysis will be the way to remove fluid.  7.  Stage II sacral decubiti on buttock.  Local wound care.  Nursing wound consultation -Dressing changes and skin creams prescribed at discharge  8.  Left heel decubiti with some blackish discoloration.   -Consider podiatry consultation --as outpatient.  Will defer to primary care physician. - Heel protectors while in bed.   9..  Chronic respiratory failure and COPD on 5 L of oxygen.  Continue nebulizer treatments.  10.  History of prostate cancer  Spoke with patient's daughter on the phone updated. Patient requested he wants to go home after thoracentesis.  If continues to remain stable will discharge him. CONSULTS OBTAINED:  Treatment Team:  Nestor Lewandowsky, MD Corey Skains, MD  DRUG ALLERGIES:   Allergies  Allergen Reactions  . Tetracyclines & Related Other (See Comments)    Reaction: Unknown     DISCHARGE MEDICATIONS:   Current Discharge Medication List  START taking these medications   Details  collagenase (SANTYL) ointment Apply topically daily. Qty: 15 g, Refills: 0    Hydrocortisone (GERHARDT'S BUTT CREAM) CREA Apply 1 application topically 2 (two) times daily. Qty: 1 each, Refills: 1      CONTINUE these medications which have NOT CHANGED   Details  acetaminophen (TYLENOL) 325 MG tablet Take 2 tablets (650 mg total) by mouth every 6 (six) hours as needed for mild pain (or Fever >/= 101).    amiodarone  (PACERONE) 200 MG tablet Take 1 tablet (200 mg total) by mouth daily. Qty: 30 tablet, Refills: 0    aspirin EC 81 MG tablet Take 81 mg by mouth daily.    atorvastatin (LIPITOR) 20 MG tablet Take 20 mg by mouth at bedtime.     calcium acetate (PHOSLO) 667 MG capsule Take 1,334 mg by mouth 3 (three) times daily with meals.     carvedilol (COREG) 3.125 MG tablet Take 1 tablet (3.125 mg total) by mouth 2 (two) times daily. 8 am, 5 pm    doxazosin (CARDURA) 8 MG tablet Take 8 mg by mouth every evening.     furosemide (LASIX) 40 MG tablet Take 40 mg by mouth 2 (two) times daily.     GUAIFENESIN 1200 PO Take 1,200 mg by mouth at bedtime.    indomethacin (INDOCIN) 50 MG capsule Take 50 mg by mouth 2 (two) times daily with a meal.    insulin detemir (LEVEMIR) 100 UNIT/ML injection Inject 30 Units into the skin at bedtime.    insulin lispro (HUMALOG) 100 UNIT/ML injection Inject 10 Units into the skin 3 (three) times daily before meals.    ipratropium-albuterol (DUONEB) 0.5-2.5 (3) MG/3ML SOLN Take 3 mLs by nebulization every 6 (six) hours as needed. Qty: 360 mL    isosorbide mononitrate (IMDUR) 30 MG 24 hr tablet Take 30 mg by mouth daily.     Lidocaine-Prilocaine, Bulk, 2.5-2.5 % CREA Apply 1 application topically as needed. Apply to fistula site prior to dialysis treatments as needed    omeprazole (PRILOSEC) 20 MG capsule Take 20 mg by mouth 2 (two) times daily before a meal.     OXYGEN Inhale 2-5 L into the lungs continuous.     spironolactone (ALDACTONE) 25 MG tablet Take 25 mg by mouth daily.     traMADol (ULTRAM) 50 MG tablet Take 1 tablet (50 mg total) by mouth every 12 (twelve) hours as needed for moderate pain or severe pain. Qty: 120 tablet, Refills: 0        If you experience worsening of your admission symptoms, develop shortness of breath, life threatening emergency, suicidal or homicidal thoughts you must seek medical attention immediately by calling 911 or calling your  MD immediately  if symptoms less severe.  You Must read complete instructions/literature along with all the possible adverse reactions/side effects for all the Medicines you take and that have been prescribed to you. Take any new Medicines after you have completely understood and accept all the possible adverse reactions/side effects.   Please note  You were cared for by a hospitalist during your hospital stay. If you have any questions about your discharge medications or the care you received while you were in the hospital after you are discharged, you can call the unit and asked to speak with the hospitalist on call if the hospitalist that took care of you is not available. Once you are discharged, your primary care physician will handle any further medical issues.  Please note that NO REFILLS for any discharge medications will be authorized once you are discharged, as it is imperative that you return to your primary care physician (or establish a relationship with a primary care physician if you do not have one) for your aftercare needs so that they can reassess your need for medications and monitor your lab values. Today   SUBJECTIVE   Feels a lot better.  Requesting to go home. VITAL SIGNS:  Blood pressure (!) 184/59, pulse 73, temperature 98.2 F (36.8 C), temperature source Oral, resp. rate 18, height 5\' 8"  (1.727 m), weight 97.3 kg (214 lb 6.4 oz), SpO2 99 %.  I/O:   Intake/Output Summary (Last 24 hours) at 01/28/17 1514 Last data filed at 01/28/17 1100  Gross per 24 hour  Intake              120 ml  Output                0 ml  Net              120 ml    PHYSICAL EXAMINATION:  GENERAL:  76 y.o.-year-old patient lying in the bed with no acute distress.  Chronically ill EYES: Pupils equal, round, reactive to light and accommodation. No scleral icterus. Extraocular muscles intact.  HEENT: Head atraumatic, normocephalic. Oropharynx and nasopharynx clear.  NECK:  Supple, no jugular  venous distention. No thyroid enlargement, no tenderness.  LUNGS: Normal breath sounds bilaterally, no wheezing, rales,rhonchi or crepitation. No use of accessory muscles of respiration.  CARDIOVASCULAR: S1, S2 normal. No murmurs, rubs, or gallops.  ABDOMEN: Soft, non-tender, non-distended. Bowel sounds present. No organomegaly or mass.  EXTREMITIES: No pedal edema, cyanosis, or clubbing.  NEUROLOGIC: Cranial nerves II through XII are intact. Muscle strength 5/5 in all extremities. Sensation intact. Gait not checked.  PSYCHIATRIC: The patient is alert and oriented x 3.  SKIN:Stage II decubitus ulcer on buttock.  Hard to determine what stage the left heel is.  Likely this is a stage II ulcer but does have some black discoloration. DATA REVIEW:   CBC   Recent Labs Lab 01/28/17 0431  WBC 6.3  HGB 8.4*  HCT 26.2*  PLT 143*    Chemistries   Recent Labs Lab 01/23/17 0635  01/28/17 0431  NA 135  < > 134*  K 4.3  < > 3.8  CL 99*  < > 95*  CO2 29  < > 31  GLUCOSE 154*  < > 178*  BUN 17  < > 28*  CREATININE 3.77*  < > 5.10*  CALCIUM 8.4*  < > 8.0*  AST 12*  --   --   ALT 12*  --   --   ALKPHOS 49  --   --   BILITOT 0.7  --   --   < > = values in this interval not displayed.  Microbiology Results   No results found for this or any previous visit (from the past 240 hour(s)).  RADIOLOGY:  Dg Chest 2 View  Result Date: 01/27/2017 CLINICAL DATA:  Chest pain EXAM: CHEST  2 VIEW COMPARISON:  01/13/2017 FINDINGS: Moderate right pleural effusion with probable loculated component posteriorly. Mild progression since the prior study. No left effusion. Cardiac enlargement without edema. Bibasilar atelectasis. IMPRESSION: Moderately large right pleural effusion with loculated component. Mild progression from the prior study. Negative for edema. Electronically Signed   By: Franchot Gallo M.D.   On: 01/27/2017 16:06  Management plans discussed with the patient, family and they are in  agreement.  CODE STATUS:     Code Status Orders        Start     Ordered   01/27/17 1843  Do not attempt resuscitation (DNR)  Continuous    Question Answer Comment  In the event of cardiac or respiratory ARREST Do not call a "code blue"   In the event of cardiac or respiratory ARREST Do not perform Intubation, CPR, defibrillation or ACLS   In the event of cardiac or respiratory ARREST Use medication by any route, position, wound care, and other measures to relive pain and suffering. May use oxygen, suction and manual treatment of airway obstruction as needed for comfort.   Comments nurse may pronounce      01/27/17 1843    Code Status History    Date Active Date Inactive Code Status Order ID Comments User Context   01/13/2017  9:29 AM 01/16/2017  4:48 PM DNR [id number]  Hillary Bow, MD Inpatient   12/30/2016  7:57 PM 01/13/2017  9:29 AM Full Code [id number]  Bettey Costa, MD Inpatient   11/25/2016  5:59 AM 11/27/2016  8:08 PM Full Code [id number]  Harrie Foreman, MD Inpatient   10/31/2016  1:48 AM 10/31/2016  7:29 PM Full Code [id number]  Saundra Shelling, MD Inpatient   10/16/2016 11:41 PM 10/21/2016 12:21 AM Full Code [id number]  Vaughan Basta, MD ED   09/14/2016  6:31 PM 09/24/2016  2:43 AM Full Code [id number]  Nicholes Mango, MD Inpatient   08/26/2016  6:45 AM 08/28/2016  8:11 PM Full Code [id number]  Saundra Shelling, MD Inpatient   08/20/2016  3:05 AM 08/22/2016  1:01 PM Full Code [id number]  Lance Coon, MD ED    Advance Directive Documentation     Most Recent Value  Type of Advance Directive  Living will, Healthcare Power of Attorney  Pre-existing out of facility DNR order (yellow form or pink MOST form)  -  "MOST" Form in Place?  -      TOTAL TIME TAKING CARE OF THIS PATIENT: *40* minutes.    Breckyn Troyer M.D on 01/28/2017 at 3:14 PM  Between 7am to 6pm - Pager - [phone] After 6pm go to [email or web address] - password [email or web address] [facility]  Office   [phone]  CC: Primary care physician; [name], MD

## 2017-01-28 NOTE — Care Management (Signed)
Spoke with [employer].  Patient discharged 10/26 with referral to [facility] for SN PT OT. [facility] does not have the referral and information will be sent to agency from [facility].  Physical therapy evaluated this morning and home health physical therapy is not recommended.  Patient's daughter in agreement. Will obtain order for home health nurse and OT for energy conservation

## 2017-01-28 NOTE — Progress Notes (Signed)
Pt is NSR and HR 60-70, okay per MD Sudini to stop the cardizem gtt. Will continue to monitor.

## 2017-01-28 NOTE — Clinical Social Work Note (Signed)
CSW received referral for SNF.  Case discussed with case manager and plan is to discharge home with home health.  CSW to sign off please re-consult if social work needs arise.  Aldrich Lloyd R. Tomeeka Plaugher, MSW, LCSWA [phone]  

## 2017-01-28 NOTE — Evaluation (Signed)
Physical Therapy Evaluation Patient Details Name: Redford Behrle MRN: [id number] DOB: 11-11-1940 Today's Date: 01/28/2017   History of Present Illness  76 y.o. male with a known history of atrial fibrillation and recurrent pleural effusion. He was at dialysis and started having rapid heart rate, intially had cardizam drip, generally feeling better though still with some SOB  Clinical Impression  Pt did relatively well with PT exam and most of his limitations stemmed from cardiovascular considerations than physical therapy/mobility issues.  He was able to walk safely w/o AD for in home distances, and though he had some fatigue with the effort he ultimately was safe and able to show ability to go home with minimal assist from family (spokw with daughter on the phone who agrees that [place] is not needed and that his biggest issues are not rehab related).    Follow Up Recommendations No PT follow up    Equipment Recommendations       Recommendations for Other Services       Precautions / Restrictions Precautions Precautions: Fall Restrictions Weight Bearing Restrictions: No      Mobility  Bed Mobility Overal bed mobility: Independent Bed Mobility: Supine to Sit     Supine to sit: Min assist;Modified independent (Device/Increase time)     General bed mobility comments: Pt able to get himself up to sitting EOB w/o assist  Transfers Overall transfer level: Independent Equipment used: None Transfers: Sit to/from Stand Sit to Stand: Modified independent (Device/Increase time)         General transfer comment: Pt able to rise to standing with good confidence and safety  Ambulation/Gait Ambulation/Gait assistance: Supervision Ambulation Distance (Feet): 125 Feet Assistive device: None       General Gait Details: Pt was safe and relatively confident with ambulation.  He was on 6 liters of O2 during the effort and still had drop in O2 to the low 90s along with fatigue but  overall he was safe for in-home distances and minimal community ambulation.    Stairs            Wheelchair Mobility    Modified Rankin (Stroke Patients Only)       Balance Overall balance assessment: Modified Independent                                           Pertinent Vitals/Pain Pain Assessment: No/denies pain    Home Living Family/patient expects to be discharged to:: Private residence Living Arrangements: Children Available Help at Discharge: Family;Available 24 hours/day Type of Home: House Home Access: Stairs to enter Entrance Stairs-Rails: Chemical engineer of Steps: 4          Prior Function Level of Independence: Independent         Comments: Indep with ADLs, household and limited community mobility (public transport to/from dialysis) without assist device; home O2 at 4-6L per patient report.  Denies fall history.     Hand Dominance        Extremity/Trunk Assessment   Upper Extremity Assessment Upper Extremity Assessment: Overall WFL for tasks assessed;Generalized weakness    Lower Extremity Assessment Lower Extremity Assessment: Overall WFL for tasks assessed;Generalized weakness       Communication   Communication: No difficulties  Cognition Arousal/Alertness: Awake/alert Behavior During Therapy: WFL for tasks assessed/performed Overall Cognitive Status: Within Functional Limits for tasks assessed  General Comments      Exercises     Assessment/Plan    PT Assessment Patient needs continued PT services  PT Problem List Decreased strength;Decreased range of motion;Decreased activity tolerance;Decreased balance;Decreased mobility;Decreased coordination;Decreased knowledge of use of DME;Decreased knowledge of precautions;Cardiopulmonary status limiting activity;Decreased skin integrity;Pain       PT Treatment Interventions DME  instruction;Gait training;Stair training;Functional mobility training;Therapeutic activities;Therapeutic exercise;Balance training;Patient/family education    PT Goals (Current goals can be found in the Care Plan section)  Acute Rehab PT Goals Patient Stated Goal: get fluid off to be able to breath better PT Goal Formulation: With patient Time For Goal Achievement: 02/11/17 Potential to Achieve Goals: Good    Frequency Min 2X/week   Barriers to discharge        Co-evaluation               AM-PAC PT "6 Clicks" Daily Activity  Outcome Measure Difficulty turning over in bed (including adjusting bedclothes, sheets and blankets)?: None Difficulty moving from lying on back to sitting on the side of the bed? : None Difficulty sitting down on and standing up from a chair with arms (e.g., wheelchair, bedside commode, etc,.)?: None Help needed moving to and from a bed to chair (including a wheelchair)?: None Help needed walking in hospital room?: A Little Help needed climbing 3-5 steps with a railing? : A Little 6 Click Score: 22    End of Session Equipment Utilized During Treatment: Gait belt;Oxygen Activity Tolerance: Patient limited by fatigue;Patient tolerated treatment well Patient left: in bed;with call bell/phone within reach;with bed alarm set   PT Visit Diagnosis: Difficulty in walking, not elsewhere classified (R26.2);Muscle weakness (generalized) (M62.81)    Time: 3094-0768 PT Time Calculation (min) (ACUTE ONLY): 23 min   Charges:   PT Evaluation $PT Eval Low Complexity: 1 Low     PT G Codes:   PT G-Codes **NOT FOR INPATIENT CLASS** Functional Assessment Tool Used: AM-PAC 6 Clicks Basic Mobility Functional Limitation: Mobility: Walking and moving around Mobility: Walking and Moving Around Current Status (G8811): At least 20 percent but less than 40 percent impaired, limited or restricted Mobility: Walking and Moving Around Goal Status [phone]): At least 1 percent  but less than 20 percent impaired, limited or restricted    Kreg Shropshire, DPT 01/28/2017, 11:53 AM

## 2017-01-28 NOTE — Procedures (Signed)
US guided right thoracentesis.  Fluid appears to be mildly loculated.  Removed 600 ml of yellow fluid.  No immediate complication.  No blood loss.  CXR pending.

## 2017-01-28 NOTE — Consult Note (Signed)
[facility] Nurse wound consult note Reason for Consult: Unstageable pressure injury to left lateral heel. Resolving partial thickness pressure injury to left buttock.  Patient states he sustained both tissue injuries during his last hospitalization, approximately 3 weeks ago. Wound type:Pressure Pressure Injury POA: Yes Left lateral heel:  3.5cm x 3.5cm affected area with central eschar (soft, black) measuring 3cm x 2cm.  Tissue surrounding eschar is moist, peeling (macerated). Small amount of light yellow exudate on old dressing consistent with autolytically debriding soft eschar, no odor. Left buttock:  3cm x 4.5cm area of early reepithelialized tissue with two small  Open areas measuring 0.4cm round covered with dried serum. Dry, no exudate. Measurement: See above. Wound bed:See above Drainage (amount, consistency, odor) See above Periwound: Intact except for as otherwise noted at left heel. Dressing procedure/placement/frequency: Patient has a Prevalon pressure redistribution heel boot for use while he is in bed.  I will provide a pressure redistribution chair pad for his use while OOB to chair and he should take this home with him.  While in house, I will provide enzymatic debridement of the left lateral heel using collagenase (Santyl) ointment applied once daily and the buttock lesion will be treated with twice daily application of Gerhart's Butt cream.  Recommend referral to outpatient [facility] for follow up of the left lateral heel ulcer or, if patient prefers, follow up by [facility] to provide oversight in the home while under close supervision by the PCP and other services involved.  Patient is at high risk for continued skin breakdown given his nutritional limitations (specifically protein) due to HD, DM and other comorbid conditions, although he seems to be on the right track with his buttock ulcer.  Patient is taught that wounds on the feet often take an extended time to heal due to circulatory  impairments. [facility] nursing team will not follow, but will remain available to this patient, the nursing and medical teams.  Please re-consult if needed. Thanks, Maudie Flakes, MSN, RN, [place], Arther Abbott  Pager# [phone]

## 2017-01-28 NOTE — Progress Notes (Signed)
Patient seen.  Still short of breath with activities.  Lungs decreased at both bases Heart is regular now at the time of my exam Functioning fistula LUE  Reviewed his CXRay.  Loculated pleural effusion.  Moderate at best.  Can do an US guided thoracentesis.    Not a candidate for any further surgical procedures for his pleural effuion.  [employer].

## 2017-01-30 ENCOUNTER — Ambulatory Visit: Payer: Medicare Other | Admitting: Surgery

## 2017-02-10 ENCOUNTER — Emergency Department: Payer: Medicare Other

## 2017-02-10 ENCOUNTER — Emergency Department
Admission: EM | Admit: 2017-02-10 | Discharge: 2017-02-10 | Disposition: A | Discharge status: Home / Self Care | Payer: Medicare Other | Attending: Emergency Medicine | Admitting: Emergency Medicine

## 2017-02-10 ENCOUNTER — Encounter: Payer: Self-pay | Admitting: Intensive Care

## 2017-02-10 DIAGNOSIS — Z79899 Other long term (current) drug therapy: Secondary | ICD-10-CM | POA: Insufficient documentation

## 2017-02-10 DIAGNOSIS — Z8546 Personal history of malignant neoplasm of prostate: Secondary | ICD-10-CM | POA: Insufficient documentation

## 2017-02-10 DIAGNOSIS — Z9981 Dependence on supplemental oxygen: Secondary | ICD-10-CM | POA: Insufficient documentation

## 2017-02-10 DIAGNOSIS — Z794 Long term (current) use of insulin: Secondary | ICD-10-CM | POA: Insufficient documentation

## 2017-02-10 DIAGNOSIS — N186 End stage renal disease: Secondary | ICD-10-CM | POA: Insufficient documentation

## 2017-02-10 DIAGNOSIS — Z992 Dependence on renal dialysis: Secondary | ICD-10-CM | POA: Diagnosis not present

## 2017-02-10 DIAGNOSIS — R0602 Shortness of breath: Secondary | ICD-10-CM | POA: Diagnosis present

## 2017-02-10 DIAGNOSIS — Z7982 Long term (current) use of aspirin: Secondary | ICD-10-CM | POA: Diagnosis not present

## 2017-02-10 DIAGNOSIS — Z87891 Personal history of nicotine dependence: Secondary | ICD-10-CM | POA: Diagnosis not present

## 2017-02-10 DIAGNOSIS — I132 Hypertensive heart and chronic kidney disease with heart failure and with stage 5 chronic kidney disease, or end stage renal disease: Secondary | ICD-10-CM | POA: Insufficient documentation

## 2017-02-10 DIAGNOSIS — E1140 Type 2 diabetes mellitus with diabetic neuropathy, unspecified: Secondary | ICD-10-CM | POA: Diagnosis not present

## 2017-02-10 DIAGNOSIS — J90 Pleural effusion, not elsewhere classified: Secondary | ICD-10-CM | POA: Diagnosis not present

## 2017-02-10 DIAGNOSIS — I5032 Chronic diastolic (congestive) heart failure: Secondary | ICD-10-CM | POA: Insufficient documentation

## 2017-02-10 DIAGNOSIS — E1122 Type 2 diabetes mellitus with diabetic chronic kidney disease: Secondary | ICD-10-CM | POA: Diagnosis not present

## 2017-02-10 DIAGNOSIS — Z96651 Presence of right artificial knee joint: Secondary | ICD-10-CM | POA: Insufficient documentation

## 2017-02-10 LAB — CBC
HEMATOCRIT: 25.5 % — AB (ref 40.0–52.0)
Hemoglobin: 8.2 g/dL — ABNORMAL LOW (ref 13.0–18.0)
MCH: 26.9 pg (ref 26.0–34.0)
MCHC: 32 g/dL (ref 32.0–36.0)
MCV: 84.1 fL (ref 80.0–100.0)
Platelets: 209 10*3/uL (ref 150–440)
RBC: 3.03 MIL/uL — ABNORMAL LOW (ref 4.40–5.90)
RDW: 15.7 % — ABNORMAL HIGH (ref 11.5–14.5)
WBC: 8.6 10*3/uL (ref 3.8–10.6)

## 2017-02-10 LAB — BASIC METABOLIC PANEL
Anion gap: 15 (ref 5–15)
BUN: 51 mg/dL — AB (ref 6–20)
CHLORIDE: 99 mmol/L — AB (ref 101–111)
CO2: 23 mmol/L (ref 22–32)
CREATININE: 6.07 mg/dL — AB (ref 0.61–1.24)
Calcium: 8.7 mg/dL — ABNORMAL LOW (ref 8.9–10.3)
GFR calc Af Amer: 9 mL/min — ABNORMAL LOW (ref >60)
GFR calc non Af Amer: 8 mL/min — ABNORMAL LOW (ref >60)
GLUCOSE: 187 mg/dL — AB (ref 65–99)
Potassium: 3.7 mmol/L (ref 3.5–5.1)
SODIUM: 137 mmol/L (ref 135–145)

## 2017-02-10 LAB — TROPONIN I
TROPONIN I: 0.03 ng/mL — AB (ref <0.03)
Troponin I: 0.03 ng/mL (ref <0.03)

## 2017-02-10 NOTE — Progress Notes (Signed)
Pre HD assessment  

## 2017-02-10 NOTE — ED Notes (Signed)
Pt missed dialysis today - called [facility] to see if they would be able to provide dialysis today - they stated that it was to late today - Dr Archie Balboa notified and he will contact [facility] dialysis

## 2017-02-10 NOTE — Care Management (Signed)
Patient is followed by [facility] health SN PT and receiving outpatient palliative  services in the home through [facility]. Have notified both agencies that patient is currently in the ED

## 2017-02-10 NOTE — ED Notes (Signed)
Pt in stable condition and transferred to dialysis at this time

## 2017-02-10 NOTE — ED Triage Notes (Signed)
Pt c/o SOB that started yesterday. Pt wears 6L O2 continuously. HX dialysis, COPD, HTN, CHF, MI. Due for dialysis today. EMS vitals O2 97%, 171/62 b/p. A&O x4.

## 2017-02-10 NOTE — Progress Notes (Signed)
[place], [place] 02/10/17  Subjective:   Patient known to our practice from outpatient dialysis.  He was recently admitted to the hospital for acute on chronic atrial fibrillation that we are and recurrent right pleural effusion.  He had a therapeutic thoracentesis.  He was discharged on October 30 He presents today again for worsening shortness of breath.  His chest x-ray shows increased right-sided pleural effusion with adjacent airspace disease likely atelectasis.  Cardiomegaly with mild pulmonary venous congestion.  Patient is requiring oxygen by nasal cannula.  Patient was evaluated in the emergency room.  After evaluation, outpatient dialysis center was called but they could not accommodate him due to the late hours.  It was determined that it is not safe for the patient to go without dialysis till tomorrow or his regular dialysis day which is Wednesday. Therefore,  nephrology consult was requested for urgent dialysis. Patient seen during dialysis treatment.  Tolerating well.  He says that his shortness of breath has improved.  Reports pain in his back due to pressure.   HEMODIALYSIS FLOWSHEET:  Blood Flow Rate (mL/min): 400 mL/min Arterial Pressure (mmHg): -160 mmHg Venous Pressure (mmHg): 150 mmHg Transmembrane Pressure (mmHg): 60 mmHg Ultrafiltration Rate (mL/min): 820 mL/min Dialysate Flow Rate (mL/min): 800 ml/min Conductivity: Machine : 14 Conductivity: Machine : 14 Dialysis Fluid Bolus: Normal Saline Bolus Amount (mL): 250 mL    Objective:  Vital signs in last 24 hours:  Temp:  [97.7 F (36.5 C)-98.1 F (36.7 C)] 98.1 F (36.7 C) (11/12 1600) Pulse Rate:  [75-84] 79 (11/12 1745) Resp:  [15-20] 18 (11/12 1745) BP: (142-176)/(46-84) 164/49 (11/12 1730) SpO2:  [97 %-100 %] 100 % (11/12 1745) Weight:  [97.1 kg (214 lb)] 97.1 kg (214 lb) (11/12 0943)  Weight change:  Filed Weights   02/10/17 0943  Weight: 97.1 kg (214 lb)     Intake/Output:   No intake or output data in the 24 hours ending 02/10/17 1758   Physical Exam: General:  No acute distress, laying in the bed  HEENT  moist oral mucous membranes, nasal cannula oxygen  Neck  supple  Pulm/lungs  bibasilar crackles  CVS/Heart  no rub  Abdomen:   Soft, nontender  Extremities:  2+ pitting edema  Neurologic:  Alert, oriented  Skin:  No acute rashes  Access:  Left upper arm AV fistula       Basic Metabolic Panel:  Recent Labs  Lab 02/10/17 1003  NA 137  K 3.7  CL 99*  CO2 23  GLUCOSE 187*  BUN 51*  CREATININE 6.07*  CALCIUM 8.7*     CBC: Recent Labs  Lab 02/10/17 1003  WBC 8.6  HGB 8.2*  HCT 25.5*  MCV 84.1  PLT 209      Lab Results  Component Value Date   HEPBSAG Negative 11/27/2016      Microbiology:  No results found for this or any previous visit (from the past 240 hour(s)).  Coagulation Studies: No results for input(s): LABPROT, INR in the last 72 hours.  Urinalysis: No results for input(s): COLORURINE, LABSPEC, PHURINE, GLUCOSEU, HGBUR, BILIRUBINUR, KETONESUR, PROTEINUR, UROBILINOGEN, NITRITE, LEUKOCYTESUR in the last 72 hours.  Invalid input(s): APPERANCEUR    Imaging: Dg Chest 2 View  Result Date: 02/10/2017 CLINICAL DATA:  SOB that started yesterday. Pt wears 6L O2 continuously. HX dialysis, COPD, HTN, CHF, MI, prostate cancer, former smoker quit 1984. Due for dialysis today. EXAM: CHEST  2 VIEW COMPARISON:  01/28/2017 FINDINGS: Midline trachea.  Moderate cardiomegaly. Atherosclerosis in the transverse aorta. Increase in small right pleural effusion. No pneumothorax. Mild pulmonary venous congestion. New right base airspace disease. IMPRESSION: Increase in right-sided pleural effusion with adjacent Airspace disease, likely atelectasis. Cardiomegaly with mild pulmonary venous congestion. Aortic Atherosclerosis (ICD10-I70.0). Electronically Signed   By: Abigail Miyamoto M.D.   On: 02/10/2017 10:39      Medications:       Assessment/ Plan:  76 y.o. male  with end stage renal disease on hemodialysis,  diabetes mellitus type II insulin dependent, hypertension, coronary artery disease, hyperlipidemia, prostate cancer status post prostatectomy, COPD  MWF [facility] [place] AVF  1.  Acute shortness of breath with pulmonary edema, right pleural effusion, cardiomegaly 2.  End-stage renal disease, hemodialysis status 3.  Anemia of chronic kidney disease 4.  Secondary hyperparathyroidism  Patient could not go to the outpatient dialysis unit because he was later in the day.  It was felt that he was not safe for discharge without dialysis.  Therefore, urgent dialysis was performed for volume removal.  Patient is tolerating the procedure well. Ultrafiltration goal 2-2.5 kg as tolerated.  Patient will be sent back to the emergency room for dialysis treatment is completed.   LOS: 0 Verania Salberg 11/12/20185:58 PM  [facility] [place], [place]

## 2017-02-10 NOTE — Progress Notes (Signed)
Post HD assessment  

## 2017-02-10 NOTE — ED Notes (Signed)
Gave report to [facility] in dialysis

## 2017-02-10 NOTE — Discharge Instructions (Signed)
Please seek medical attention for any high fevers, chest pain, shortness of breath, change in behavior, persistent vomiting, bloody stool or any other new or concerning symptoms.  

## 2017-02-10 NOTE — ED Notes (Signed)
Gave patient food tray 

## 2017-02-10 NOTE — ED Provider Notes (Signed)
Patient with shortness of breath signout from Dr. Archie Balboa pending completion of dialysis.  Plan is for the patient to be discharged once dialyzed.  Physical Exam  BP (!) 186/54 (BP Location: Right Arm)   Pulse 87   Temp 98.7 F (37.1 C) (Oral)   Resp 16   Ht 5\' 8"  (1.727 m)   Wt 97.1 kg (214 lb)   SpO2 99%   BMI 32.54 kg/m   ----------------------------------------- 8:25 PM on 02/10/2017 -----------------------------------------    Physical Exam Patient at this time without any respiratory distress.  Resting comfortably.  No active bleeding from his dialysis fistula. ED Course  Procedures  MDM Patient saying that he feels well.  Says "I am ready to go home."  Resting comfortably on his nasal cannula baseline oxygen.       Orbie Pyo, MD 02/10/17 2025

## 2017-02-10 NOTE — Progress Notes (Signed)
HD tx end  

## 2017-02-10 NOTE — ED Provider Notes (Signed)
[facility] Emergency Department Provider Note  ____________________________________________   I have reviewed the triage vital signs and the nursing notes.   HISTORY  Chief Complaint Shortness of Breath   History limited by: Not Limited   HPI Calvin Good is a 76 y.o. male who presents to the emergency department today because of concern for shortness of breath.  DURATION:few days TIMING: constant QUALITY: feels like "fluid in lungs" CONTEXT: patient with history of recent admission secondary to pleural effusion and therapeutic thoracentesis. States past few days it has been getting worse. MODIFYING FACTORS: worse with lying flat ASSOCIATED SYMPTOMS: cough. Denies chest pain. Denies fevers  Per medical record review patient has a history of recent admission secondary to pleural effusion and therapeutic thoracentesis.  Past Medical History:  Diagnosis Date  . Cancer [facility])    Prostate  . CHF (congestive heart failure) ([place])   . Chronic kidney disease   . Complication of anesthesia    hard to wake up  . COPD (chronic obstructive pulmonary disease) ([place])   . Diabetes mellitus without complication ([place])   . Difficult intubation   . Dyspnea   . GERD (gastroesophageal reflux disease)   . Hyperlipidemia   . Hypertension   . Pleural effusion   . Renal insufficiency     Patient Active Problem List   Diagnosis Date Noted  . Pressure injury of skin 01/28/2017  . ESRD on dialysis ([place])   . Recurrent pleural effusion on right   . Atrial fibrillation ([place]) 12/30/2016  . Atrial fibrillation with RVR ([place]) 11/25/2016  . A-fib ([place]) 10/30/2016  . Allergic rhinitis due to pollen 10/29/2016  . Chronic airway obstruction ([place]) 10/29/2016  . Chronic fatigue syndrome 10/29/2016  . Dependence on renal dialysis ([place]) 10/29/2016  . Diabetic neuropathy ([place]) 10/29/2016  . Diabetic renal disease ([place]) 10/29/2016  . Heart failure ([place]) 10/29/2016  .  Hyperglycemia due to type 2 diabetes mellitus ([place]) 10/29/2016  . Incontinence without sensory awareness 10/29/2016  . Long term current use of insulin ([place]) 10/29/2016  . Obstructive sleep apnea 10/29/2016  . Urinary incontinence 10/29/2016  . Acute respiratory failure ([place]) 10/16/2016  . Anxiety 10/10/2016  . Acute encephalopathy   . HCAP (healthcare-associated pneumonia) 09/14/2016  . Tobacco use 09/03/2016  . Aneurysm ([place]) 08/28/2016  . Pseudoaneurysm following procedure ([place]) 08/26/2016  . Chest tightness 08/20/2016  . GERD without esophagitis 08/20/2016  . NSTEMI (non-ST elevated myocardial infarction) ([place]) 08/20/2016  . Cancer of prostate ([place]) 05/16/2016  . Chronic diastolic CHF (congestive heart failure) ([place]) 05/16/2016  . Diabetic polyneuropathy associated with type 2 diabetes mellitus ([place]) 05/16/2016  . Onychomycosis of toenail 05/13/2016  . Essential hypertension, benign 04/23/2016  . Hyperlipidemia 04/23/2016  . Type 2 diabetes mellitus with chronic kidney disease on chronic dialysis, with long-term current use of insulin ([place]) 04/23/2016  . ESRD (end stage renal disease) ([place]) 04/23/2016    Past Surgical History:  Procedure Laterality Date  . APPENDECTOMY    . DIALYSIS/PERMA CATHETER INSERTION    . JOINT REPLACEMENT     right knee   . postate removal     . PROSTATE SURGERY      Prior to Admission medications   Medication Sig Start Date End Date Taking? Authorizing Provider  acetaminophen (TYLENOL) 325 MG tablet Take 2 tablets (650 mg total) by mouth every 6 (six) hours as needed for mild pain (or Fever >/= 101). 09/23/16   Nicholes Mango, MD  amiodarone (PACERONE) 200 MG tablet  Take 1 tablet (200 mg total) by mouth daily. 01/15/17   Hillary Bow, MD  aspirin EC 81 MG tablet Take 81 mg by mouth daily.    [provider]  atorvastatin (LIPITOR) 20 MG tablet Take 20 mg by mouth at bedtime.     [provider]  calcium acetate (PHOSLO) 667 MG  capsule Take 1,334 mg by mouth 3 (three) times daily with meals.     [provider]  carvedilol (COREG) 3.125 MG tablet Take 1 tablet (3.125 mg total) by mouth 2 (two) times daily. 8 am, 5 pm 01/16/17   Hillary Bow, MD  collagenase (SANTYL) ointment Apply topically daily. 01/29/17   Fritzi Mandes, MD  doxazosin (CARDURA) 8 MG tablet Take 8 mg by mouth every evening.     [provider]  furosemide (LASIX) 40 MG tablet Take 40 mg by mouth 2 (two) times daily.     [provider]  GUAIFENESIN 1200 PO Take 1,200 mg by mouth at bedtime.    [provider]  Hydrocortisone (GERHARDT'S BUTT CREAM) CREA Apply 1 application topically 2 (two) times daily. 01/28/17   Fritzi Mandes, MD  indomethacin (INDOCIN) 50 MG capsule Take 50 mg by mouth 2 (two) times daily with a meal.    [provider]  insulin detemir (LEVEMIR) 100 UNIT/ML injection Inject 30 Units into the skin at bedtime.    [provider]  insulin lispro (HUMALOG) 100 UNIT/ML injection Inject 10 Units into the skin 3 (three) times daily before meals.    [provider]  ipratropium-albuterol (DUONEB) 0.5-2.5 (3) MG/3ML SOLN Take 3 mLs by nebulization every 6 (six) hours as needed. 09/23/16   Nicholes Mango, MD  isosorbide mononitrate (IMDUR) 30 MG 24 hr tablet Take 30 mg by mouth daily.  11/28/16   [provider]  Lidocaine-Prilocaine, Bulk, 2.5-2.5 % CREA Apply 1 application topically as needed. Apply to fistula site prior to dialysis treatments as needed    [provider]  omeprazole (PRILOSEC) 20 MG capsule Take 20 mg by mouth 2 (two) times daily before a meal.     [provider]  OXYGEN Inhale 2-5 L into the lungs continuous.     [provider]  spironolactone (ALDACTONE) 25 MG tablet Take 25 mg by mouth daily.     [provider]  traMADol (ULTRAM) 50 MG tablet Take 1 tablet (50 mg total) by mouth every 12 (twelve) hours as needed for  moderate pain or severe pain. 01/16/17 01/16/18  Toni Arthurs, NP    Allergies Tetracyclines & related  Family History  Problem Relation Age of Onset  . Colon cancer Father   . Brain cancer Mother   . CAD Neg Hx   . Diabetes Neg Hx     Social History Social History   Tobacco Use  . Smoking status: Former Smoker    Types: Cigarettes    Last attempt to quit: 05/21/1982    Years since quitting: 34.7  . Smokeless tobacco: Never Used  Substance Use Topics  . Alcohol use: Yes    Alcohol/week: 1.8 oz    Types: 3 Glasses of wine per week  . Drug use: No    Review of Systems Constitutional: No fever/chills Eyes: No visual changes. ENT: No sore throat. Cardiovascular: Denies chest pain. Respiratory: Positive for shortness of breath. Gastrointestinal: No abdominal pain.  No nausea, no vomiting.  No diarrhea.   Genitourinary: Negative for dysuria. Musculoskeletal: Negative for back pain. Skin:  Negative for rash. Neurological: Negative for headaches, focal weakness or numbness.  ____________________________________________   PHYSICAL EXAM:  VITAL SIGNS: ED Triage Vitals  Enc Vitals Group     BP 02/10/17 0941 (!) 162/52     Pulse Rate 02/10/17 0941 75     Resp 02/10/17 0941 18     Temp 02/10/17 0941 97.7 F (36.5 C)     Temp Source 02/10/17 0941 Oral     SpO2 02/10/17 0940 97 %     Weight 02/10/17 0943 214 lb (97.1 kg)     Height 02/10/17 0943 5\' 8"  (1.727 m)     Head Circumference --      Peak Flow --      Pain Score 02/10/17 0940 3   Constitutional: Alert and oriented. Well appearing and in no distress. Eyes: Conjunctivae are normal.  ENT   Head: Normocephalic and atraumatic.   Nose: No congestion/rhinnorhea.   Mouth/Throat: Mucous membranes are moist.   Neck: No stridor. Hematological/Lymphatic/Immunilogical: No cervical lymphadenopathy. Cardiovascular: Normal rate, regular rhythm.  No murmurs, rubs, or gallops.  Respiratory: Normal  respiratory effort without tachypnea nor retractions. Some rhonchi and diminished breath sounds in right lower lung.  Gastrointestinal: Soft and non tender. No rebound. No guarding.  Genitourinary: Deferred Musculoskeletal: Normal range of motion in all extremities. No lower extremity edema. Neurologic:  Normal speech and language. No gross focal neurologic deficits are appreciated.  Skin:  Skin is warm, dry and intact. No rash noted. Psychiatric: Mood and affect are normal. Speech and behavior are normal. Patient exhibits appropriate insight and judgment.  ____________________________________________    LABS (pertinent positives/negatives)  BMP glu 187, cr 6.07 CBC hgb 8.2, wbc 8.6 Trop 0.03 ____________________________________________   EKG  I, Nance Pear, attending physician, personally viewed and interpreted this EKG  EKG Time: 0945 Rate: 74 Rhythm: sinus rhythm Axis: right axis deviation Intervals: qtc 528 QRS: RBBB, LPFB ST changes: no st elevation Impression: abnormal ekg   ____________________________________________    RADIOLOGY  CXR Small right pleural effusion. Atelectasis.    ____________________________________________   PROCEDURES  Procedures  ____________________________________________   INITIAL IMPRESSION / ASSESSMENT AND PLAN / ED COURSE  Pertinent labs & imaging results that were available during my care of the patient were reviewed by me and considered in my medical decision making (see chart for details).  Differential includes, but is not limited to, viral syndrome, bronchitis including COPD exacerbation, pneumonia, reactive airway disease including asthma, CHF including exacerbation with or without pulmonary/interstitial edema, pneumothorax, ACS, thoracic trauma, and pulmonary embolism.  Workup again shows a small pleural effusion.  Patient is not in any significant distress.  No pneumonia.  This point I did discuss with the  patient.  Offered admission however do feel it would be reasonable to continue workup as an outpatient.  Patient felt comfortable working this up as an outpatient.  Will help to arrange patient to receive dialysis today given that he missed his course.   ____________________________________________   FINAL CLINICAL IMPRESSION(S) / ED DIAGNOSES  Final diagnoses:  SOB (shortness of breath)     Note: This dictation was prepared with Dragon dictation. Any transcriptional errors that result from this process are unintentional     Nance Pear, MD 02/10/17 [phone]

## 2017-02-10 NOTE — Progress Notes (Signed)
HD tx start 

## 2017-02-12 ENCOUNTER — Other Ambulatory Visit: Payer: Self-pay | Admitting: Nephrology

## 2017-02-12 DIAGNOSIS — J90 Pleural effusion, not elsewhere classified: Secondary | ICD-10-CM

## 2017-02-13 NOTE — Discharge Instructions (Signed)
Thoracentesis, Care After °Refer to this sheet in the next few weeks. These instructions provide you with information about caring for yourself after your procedure. Your health care provider may also give you more specific instructions. Your treatment has been planned according to current medical practices, but problems sometimes occur. Call your health care provider if you have any problems or questions after your procedure. °What can I expect after the procedure? °After your procedure, it is common to have pain at the puncture site. °Follow these instructions at home: °· Take medicines only as directed by your health care provider. °· You may return to your normal diet and normal activities as directed by your health care provider. °· Drink enough fluid to keep your urine clear or pale yellow. °· Do not take baths, swim, or use a hot tub until your health care provider approves. °· Follow your health care provider's instructions about: °? Puncture site care. °? Bandage (dressing) changes and removal. °· Check your puncture site every day for signs of infection. Watch for: °? Redness, swelling, or pain. °? Fluid, blood, or pus. °· Keep all follow-up visits as directed by your health care provider. This is important. °Contact a health care provider if: °· You have redness, swelling, or pain at your puncture site. °· You have fluid, blood, or pus coming from your puncture site. °· You have a fever. °· You have chills. °· You have nausea or vomiting. °· You have trouble breathing. °· You develop a worsening cough. °Get help right away if: °· You have extreme shortness of breath. °· You develop chest pain. °· You faint or feel light-headed. °This information is not intended to replace advice given to you by your health care provider. Make sure you discuss any questions you have with your health care provider. °Document Released: 04/08/2014 Document Revised: 11/18/2015 Document Reviewed: 12/28/2013 °Elsevier  Interactive Patient Education © 2018 [employer] ° °

## 2017-02-14 ENCOUNTER — Ambulatory Visit
Admission: RE | Admit: 2017-02-14 | Discharge: 2017-02-14 | Disposition: A | Discharge status: Home / Self Care | Payer: Medicare Other | Source: Ambulatory Visit | Attending: Nephrology | Admitting: Nephrology

## 2017-02-14 ENCOUNTER — Ambulatory Visit
Admission: RE | Admit: 2017-02-14 | Discharge: 2017-02-14 | Disposition: A | Discharge status: Home / Self Care | Payer: Medicare Other | Source: Ambulatory Visit | Attending: Radiology | Admitting: Radiology

## 2017-02-14 DIAGNOSIS — I509 Heart failure, unspecified: Secondary | ICD-10-CM | POA: Insufficient documentation

## 2017-02-14 DIAGNOSIS — J90 Pleural effusion, not elsewhere classified: Secondary | ICD-10-CM | POA: Diagnosis not present

## 2017-02-14 DIAGNOSIS — Z9889 Other specified postprocedural states: Secondary | ICD-10-CM

## 2017-02-14 LAB — BODY FLUID CELL COUNT WITH DIFFERENTIAL
EOS FL: 0 %
LYMPHS FL: 96 %
Monocyte-Macrophage-Serous Fluid: 0 %
Neutrophil Count, Fluid: 4 %
Total Nucleated Cell Count, Fluid: 394 cu mm

## 2017-02-14 LAB — PATHOLOGIST SMEAR REVIEW

## 2017-02-14 NOTE — Procedures (Signed)
Ultrasound-guided diagnostic and therapeutic right thoracentesis performed yielding 650 cc of yellow fluid. No immediate complications. Follow-up chest x-ray pending.The fluid was sent to the lab for preordered studies.

## 2017-03-12 ENCOUNTER — Other Ambulatory Visit: Payer: Self-pay | Admitting: Nephrology

## 2017-03-12 DIAGNOSIS — J90 Pleural effusion, not elsewhere classified: Secondary | ICD-10-CM

## 2017-03-14 ENCOUNTER — Emergency Department: Payer: Medicare Other

## 2017-03-14 ENCOUNTER — Emergency Department
Admission: EM | Admit: 2017-03-14 | Discharge: 2017-03-14 | Disposition: A | Discharge status: Home / Self Care | Payer: Medicare Other | Source: Home / Self Care | Attending: Student in an Organized Health Care Education/Training Program | Admitting: Student in an Organized Health Care Education/Training Program

## 2017-03-14 ENCOUNTER — Encounter: Payer: Self-pay | Admitting: Emergency Medicine

## 2017-03-14 DIAGNOSIS — J9621 Acute and chronic respiratory failure with hypoxia: Secondary | ICD-10-CM

## 2017-03-14 DIAGNOSIS — Z992 Dependence on renal dialysis: Secondary | ICD-10-CM | POA: Diagnosis not present

## 2017-03-14 DIAGNOSIS — J90 Pleural effusion, not elsewhere classified: Secondary | ICD-10-CM | POA: Insufficient documentation

## 2017-03-14 DIAGNOSIS — E8779 Other fluid overload: Secondary | ICD-10-CM | POA: Diagnosis present

## 2017-03-14 DIAGNOSIS — I130 Hypertensive heart and chronic kidney disease with heart failure and stage 1 through stage 4 chronic kidney disease, or unspecified chronic kidney disease: Secondary | ICD-10-CM

## 2017-03-14 DIAGNOSIS — R0602 Shortness of breath: Secondary | ICD-10-CM

## 2017-03-14 DIAGNOSIS — I160 Hypertensive urgency: Secondary | ICD-10-CM | POA: Diagnosis present

## 2017-03-14 DIAGNOSIS — Z79899 Other long term (current) drug therapy: Secondary | ICD-10-CM | POA: Insufficient documentation

## 2017-03-14 DIAGNOSIS — J810 Acute pulmonary edema: Secondary | ICD-10-CM | POA: Diagnosis present

## 2017-03-14 DIAGNOSIS — N186 End stage renal disease: Secondary | ICD-10-CM | POA: Diagnosis present

## 2017-03-14 DIAGNOSIS — Z9981 Dependence on supplemental oxygen: Secondary | ICD-10-CM | POA: Diagnosis not present

## 2017-03-14 DIAGNOSIS — N189 Chronic kidney disease, unspecified: Secondary | ICD-10-CM | POA: Insufficient documentation

## 2017-03-14 DIAGNOSIS — I5032 Chronic diastolic (congestive) heart failure: Secondary | ICD-10-CM | POA: Diagnosis present

## 2017-03-14 DIAGNOSIS — Z66 Do not resuscitate: Secondary | ICD-10-CM | POA: Diagnosis present

## 2017-03-14 DIAGNOSIS — Z794 Long term (current) use of insulin: Secondary | ICD-10-CM | POA: Insufficient documentation

## 2017-03-14 DIAGNOSIS — N2581 Secondary hyperparathyroidism of renal origin: Secondary | ICD-10-CM | POA: Diagnosis present

## 2017-03-14 DIAGNOSIS — J449 Chronic obstructive pulmonary disease, unspecified: Secondary | ICD-10-CM | POA: Insufficient documentation

## 2017-03-14 DIAGNOSIS — I509 Heart failure, unspecified: Secondary | ICD-10-CM

## 2017-03-14 DIAGNOSIS — Z7982 Long term (current) use of aspirin: Secondary | ICD-10-CM | POA: Insufficient documentation

## 2017-03-14 DIAGNOSIS — R34 Anuria and oliguria: Secondary | ICD-10-CM | POA: Diagnosis present

## 2017-03-14 DIAGNOSIS — E119 Type 2 diabetes mellitus without complications: Secondary | ICD-10-CM

## 2017-03-14 DIAGNOSIS — E785 Hyperlipidemia, unspecified: Secondary | ICD-10-CM | POA: Diagnosis present

## 2017-03-14 DIAGNOSIS — Z87891 Personal history of nicotine dependence: Secondary | ICD-10-CM | POA: Insufficient documentation

## 2017-03-14 DIAGNOSIS — Z9889 Other specified postprocedural states: Secondary | ICD-10-CM

## 2017-03-14 DIAGNOSIS — J9811 Atelectasis: Secondary | ICD-10-CM | POA: Diagnosis present

## 2017-03-14 DIAGNOSIS — E1122 Type 2 diabetes mellitus with diabetic chronic kidney disease: Secondary | ICD-10-CM | POA: Diagnosis present

## 2017-03-14 DIAGNOSIS — I480 Paroxysmal atrial fibrillation: Secondary | ICD-10-CM | POA: Diagnosis present

## 2017-03-14 DIAGNOSIS — K219 Gastro-esophageal reflux disease without esophagitis: Secondary | ICD-10-CM | POA: Diagnosis present

## 2017-03-14 DIAGNOSIS — Z8546 Personal history of malignant neoplasm of prostate: Secondary | ICD-10-CM

## 2017-03-14 DIAGNOSIS — I132 Hypertensive heart and chronic kidney disease with heart failure and with stage 5 chronic kidney disease, or end stage renal disease: Secondary | ICD-10-CM | POA: Diagnosis present

## 2017-03-14 DIAGNOSIS — R300 Dysuria: Secondary | ICD-10-CM | POA: Diagnosis present

## 2017-03-14 DIAGNOSIS — J440 Chronic obstructive pulmonary disease with acute lower respiratory infection: Secondary | ICD-10-CM | POA: Diagnosis present

## 2017-03-14 LAB — BASIC METABOLIC PANEL
ANION GAP: 11 (ref 5–15)
BUN: 47 mg/dL — ABNORMAL HIGH (ref 6–20)
CO2: 28 mmol/L (ref 22–32)
Calcium: 9 mg/dL (ref 8.9–10.3)
Chloride: 99 mmol/L — ABNORMAL LOW (ref 101–111)
Creatinine, Ser: 5.01 mg/dL — ABNORMAL HIGH (ref 0.61–1.24)
GFR, EST AFRICAN AMERICAN: 12 mL/min — AB (ref >60)
GFR, EST NON AFRICAN AMERICAN: 10 mL/min — AB (ref >60)
Glucose, Bld: 220 mg/dL — ABNORMAL HIGH (ref 65–99)
POTASSIUM: 3.8 mmol/L (ref 3.5–5.1)
SODIUM: 138 mmol/L (ref 135–145)

## 2017-03-14 LAB — CBC WITH DIFFERENTIAL/PLATELET
BASOS ABS: 0 10*3/uL (ref 0–0.1)
BASOS PCT: 1 %
EOS ABS: 0.2 10*3/uL (ref 0–0.7)
EOS PCT: 4 %
HCT: 25.4 % — ABNORMAL LOW (ref 40.0–52.0)
Hemoglobin: 8.1 g/dL — ABNORMAL LOW (ref 13.0–18.0)
LYMPHS PCT: 18 %
Lymphs Abs: 1.1 10*3/uL (ref 1.0–3.6)
MCH: 27.1 pg (ref 26.0–34.0)
MCHC: 32.1 g/dL (ref 32.0–36.0)
MCV: 84.5 fL (ref 80.0–100.0)
Monocytes Absolute: 0.3 10*3/uL (ref 0.2–1.0)
Monocytes Relative: 6 %
Neutro Abs: 4.5 10*3/uL (ref 1.4–6.5)
Neutrophils Relative %: 71 %
PLATELETS: 139 10*3/uL — AB (ref 150–440)
RBC: 3.01 MIL/uL — AB (ref 4.40–5.90)
RDW: 19.3 % — ABNORMAL HIGH (ref 11.5–14.5)
WBC: 6.2 10*3/uL (ref 3.8–10.6)

## 2017-03-14 MED ORDER — DIPHENHYDRAMINE HCL 50 MG/ML IJ SOLN: 12.5000 mg | Freq: Once | INTRAMUSCULAR | Status: DC

## 2017-03-14 NOTE — Procedures (Signed)
Interventional Radiology Procedure Note  Procedure: US guided right thoracentesis  Complications: None  Estimated Blood Loss: None  Findings: 800 mL of fluid removed from right pleural space.  Venetia Night. Kathlene Cote, M.D Pager:  [phone]

## 2017-03-14 NOTE — ED Notes (Signed)
First Nurse Note:  Patient helped from vehicle into WC.  Patient received dialysis 3 days a week, last time was on Wed.  Here with complaints of SHOB, on home O2.  Switched to our tank.  Daughter states patient is [facility] related to "fluid on lungs" scheduled for thoracentesis next week.  Alert and oriented.

## 2017-03-14 NOTE — ED Provider Notes (Addendum)
[facility] Emergency Department Provider Note    None    (approximate)  I have reviewed the triage vital signs and the nursing notes.   HISTORY  Chief Complaint Shortness of Breath    HPI Cordelro Gautreau is a 76 y.o. male with multiple chronic comorbidities including underlying COPD congestive heart failure prostate cancer with chronic hypoxic respiratory failure requiring multiple admits admissions and significant decline over the past several months presents to the ER with worsening shortness of breath and acute on chronic hypoxic respiratory failure.  Patient states that over the past 2 days he has had significant worsening of dyspnea even at rest.  He wears 6 L oxygen at home.  Denies any fevers.  No worsening cough.  States that he is not having any significant pain.  States that his spirits are high right now patient was in conversations with palliative care and had tentatively agreed to stop dialysis after his grandson got married 2 weeks ago but the family is going through complicated sale of his home and in order to close the estate he wants to be present for that for which she is continuing therapy until completion of this.  Past Medical History:  Diagnosis Date  . Cancer [facility])    Prostate  . CHF (congestive heart failure) ([place])   . Chronic kidney disease   . Complication of anesthesia    hard to wake up  . COPD (chronic obstructive pulmonary disease) ([place])   . Diabetes mellitus without complication ([place])   . Difficult intubation   . Dyspnea   . GERD (gastroesophageal reflux disease)   . Hyperlipidemia   . Hypertension   . Pleural effusion   . Renal insufficiency    Family History  Problem Relation Age of Onset  . Colon cancer Father   . Brain cancer Mother   . CAD Neg Hx   . Diabetes Neg Hx    Past Surgical History:  Procedure Laterality Date  . APPENDECTOMY    . AV FISTULA PLACEMENT Left 05/30/2016   Procedure: ARTERIOVENOUS (AV)  FISTULA CREATION ( BRACHIOCEPHALIC );  Surgeon: [name], MD;  Location: [facility] ORS;  Service: Vascular;  Laterality: Left;  . CAPD INSERTION N/A 05/30/2016   Procedure: LAPAROSCOPIC INSERTION CONTINUOUS AMBULATORY PERITONEAL DIALYSIS  (CAPD) CATHETER;  Surgeon: [name], MD;  Location: [facility] ORS;  Service: Vascular;  Laterality: N/A;  . DIALYSIS/PERMA CATHETER INSERTION    . DIALYSIS/PERMA CATHETER REMOVAL N/A 08/21/2016   Procedure: Dialysis/Perma Catheter Removal;  Surgeon: [name], MD;  Location: [place] [facility];  Service: Cardiovascular;  Laterality: N/A;  . JOINT REPLACEMENT     right knee   . LEFT HEART CATH AND CORONARY ANGIOGRAPHY N/A 08/21/2016   Procedure: Left Heart Cath and Coronary Angiography possible PCI;  Surgeon: [name], MD;  Location: [place] [facility];  Service: Cardiovascular;  Laterality: N/A;  . postate removal     . PROSTATE SURGERY    . PSEUDOANERYSM COMPRESSION Right 08/27/2016   Procedure: Pseudoanerysm Compression;  Surgeon: Katha Cabal, MD;  Location: [place] [facility];  Service: Cardiovascular;  Laterality: Right;  . REMOVAL OF A DIALYSIS CATHETER N/A 08/08/2016   Procedure: REMOVAL OF A DIALYSIS CATHETER;  Surgeon: [name], MD;  Location: [facility] ORS;  Service: Vascular;  Laterality: N/A;  . VIDEO ASSISTED THORACOSCOPY (VATS)/THOROCOTOMY Right 01/06/2017   Procedure: PREOPERATIVE BRONCHOSCOPY, RIGHT VIDEO ASSISTED THORACOSCOPY (VATS) WITH TALC PLEURODESIS;  Surgeon: Genevive Bi,  Christia Reading, MD;  Location: [facility] ORS;  Service: General;  Laterality: Right;   Patient Active Problem List   Diagnosis Date Noted  . Pressure injury of skin 01/28/2017  . ESRD on dialysis ([place])   . Recurrent pleural effusion on right   . Atrial fibrillation ([place]) 12/30/2016  . Atrial fibrillation with RVR ([place]) 11/25/2016  . A-fib ([place]) 10/30/2016  . Allergic rhinitis due to pollen 10/29/2016  . Chronic airway obstruction ([place]) 10/29/2016  . Chronic fatigue  syndrome 10/29/2016  . Dependence on renal dialysis ([place]) 10/29/2016  . Diabetic neuropathy ([place]) 10/29/2016  . Diabetic renal disease ([place]) 10/29/2016  . Heart failure ([place]) 10/29/2016  . Hyperglycemia due to type 2 diabetes mellitus ([place]) 10/29/2016  . Incontinence without sensory awareness 10/29/2016  . Long term current use of insulin ([place]) 10/29/2016  . Obstructive sleep apnea 10/29/2016  . Urinary incontinence 10/29/2016  . Acute respiratory failure ([place]) 10/16/2016  . Anxiety 10/10/2016  . Acute encephalopathy   . HCAP (healthcare-associated pneumonia) 09/14/2016  . Tobacco use 09/03/2016  . Aneurysm ([place]) 08/28/2016  . Pseudoaneurysm following procedure ([place]) 08/26/2016  . Chest tightness 08/20/2016  . GERD without esophagitis 08/20/2016  . NSTEMI (non-ST elevated myocardial infarction) ([place]) 08/20/2016  . Cancer of prostate ([place]) 05/16/2016  . Chronic diastolic CHF (congestive heart failure) ([place]) 05/16/2016  . Diabetic polyneuropathy associated with type 2 diabetes mellitus ([place]) 05/16/2016  . Onychomycosis of toenail 05/13/2016  . Essential hypertension, benign 04/23/2016  . Hyperlipidemia 04/23/2016  . Type 2 diabetes mellitus with chronic kidney disease on chronic dialysis, with long-term current use of insulin ([place]) 04/23/2016  . ESRD (end stage renal disease) ([place]) 04/23/2016      Prior to Admission medications   Medication Sig Start Date End Date Taking? Authorizing Provider  acetaminophen (TYLENOL) 325 MG tablet Take 2 tablets (650 mg total) by mouth every 6 (six) hours as needed for mild pain (or Fever >/= 101). 09/23/16   Nicholes Mango, MD  amiodarone (PACERONE) 200 MG tablet Take 1 tablet (200 mg total) by mouth daily. 01/15/17   Hillary Bow, MD  aspirin EC 81 MG tablet Take 81 mg by mouth daily.    [provider]  atorvastatin (LIPITOR) 20 MG tablet Take 20 mg by mouth at bedtime.     [provider]  calcium acetate (PHOSLO) 667 MG  capsule Take 1,334 mg by mouth 3 (three) times daily with meals.     [provider]  carvedilol (COREG) 3.125 MG tablet Take 1 tablet (3.125 mg total) by mouth 2 (two) times daily. 8 am, 5 pm 01/16/17   Hillary Bow, MD  collagenase (SANTYL) ointment Apply topically daily. 01/29/17   Fritzi Mandes, MD  doxazosin (CARDURA) 8 MG tablet Take 8 mg by mouth every evening.     [provider]  furosemide (LASIX) 40 MG tablet Take 40 mg by mouth 2 (two) times daily.     [provider]  GUAIFENESIN 1200 PO Take 1,200 mg by mouth at bedtime.    [provider]  Hydrocortisone (GERHARDT'S BUTT CREAM) CREA Apply 1 application topically 2 (two) times daily. 01/28/17   Fritzi Mandes, MD  indomethacin (INDOCIN) 50 MG capsule Take 50 mg by mouth 2 (two) times daily with a meal.    [provider]  insulin detemir (LEVEMIR) 100 UNIT/ML injection Inject 30 Units into the skin at bedtime.    [provider]  insulin lispro (HUMALOG) 100 UNIT/ML injection Inject 10 Units into  the skin 3 (three) times daily before meals.    [provider]  ipratropium-albuterol (DUONEB) 0.5-2.5 (3) MG/3ML SOLN Take 3 mLs by nebulization every 6 (six) hours as needed. 09/23/16   Nicholes Mango, MD  isosorbide mononitrate (IMDUR) 30 MG 24 hr tablet Take 30 mg by mouth daily.  11/28/16   [provider]  Lidocaine-Prilocaine, Bulk, 2.5-2.5 % CREA Apply 1 application topically as needed. Apply to fistula site prior to dialysis treatments as needed    [provider]  omeprazole (PRILOSEC) 20 MG capsule Take 20 mg by mouth 2 (two) times daily before a meal.     [provider]  OXYGEN Inhale 2-5 L into the lungs continuous.     [provider]  spironolactone (ALDACTONE) 25 MG tablet Take 25 mg by mouth daily.     [provider]  traMADol (ULTRAM) 50 MG tablet Take 1 tablet (50 mg total) by mouth every 12 (twelve) hours as needed for  moderate pain or severe pain. 01/16/17 01/16/18  Toni Arthurs, NP    Allergies Tetracyclines & related    Social History Social History   Tobacco Use  . Smoking status: Former Smoker    Types: Cigarettes    Last attempt to quit: 05/21/1982    Years since quitting: 34.8  . Smokeless tobacco: Never Used  Substance Use Topics  . Alcohol use: Yes    Alcohol/week: 1.8 oz    Types: 3 Glasses of wine per week  . Drug use: No    Review of Systems Patient denies headaches, rhinorrhea, blurry vision, numbness, shortness of breath, chest pain, edema, cough, abdominal pain, nausea, vomiting, diarrhea, dysuria, fevers, rashes or hallucinations unless otherwise stated above in HPI. ____________________________________________   PHYSICAL EXAM:  VITAL SIGNS: Vitals:   03/14/17 1119  BP: (!) 163/46  Pulse: 78  Resp: (!) 24  Temp: 97.9 F (36.6 C)  SpO2: (!) 83%    Constitutional: Alert and oriented. Frail and ill appearingEyes: Conjunctivae are normal.  Head: Atraumatic. Nose: No congestion/rhinnorhea. Mouth/Throat: Mucous membranes are moist.   Neck: No stridor. Painless ROM.  Cardiovascular: Normal rate, regular rhythm. Grossly normal heart sounds.  Good peripheral circulation. Respiratory: tachypnea, crackles of left base with diminshed breathsounds on the right Gastrointestinal: Soft and nontender. No distention. No abdominal bruits. No CVA tenderness. Musculoskeletal: No lower extremity tenderness, 1+edema.  No joint effusions. Neurologic:  Normal speech and language. No gross focal neurologic deficits are appreciated. No facial droop Skin:  Skin is warm, dry and intact. No rash noted. Psychiatric: Mood and affect are normal. Speech and behavior are normal.  ____________________________________________   LABS (all labs ordered are listed, but only abnormal results are displayed)  Results for orders placed or performed during the hospital encounter of 03/14/17 (from  the past 24 hour(s))  CBC with Differential/Platelet     Status: Abnormal   Collection Time: 03/14/17 11:40 AM  Result Value Ref Range   WBC 6.2 3.8 - 10.6 K/uL   RBC 3.01 (L) 4.40 - 5.90 MIL/uL   Hemoglobin 8.1 (L) 13.0 - 18.0 g/dL   HCT 25.4 (L) 40.0 - 52.0 %   MCV 84.5 80.0 - 100.0 fL   MCH 27.1 26.0 - 34.0 pg   MCHC 32.1 32.0 - 36.0 g/dL   RDW 19.3 (H) 11.5 - 14.5 %   Platelets 139 (L) 150 - 440 K/uL   Neutrophils Relative % 71 %   Neutro Abs 4.5 1.4 - 6.5 K/uL  Lymphocytes Relative 18 %   Lymphs Abs 1.1 1.0 - 3.6 K/uL   Monocytes Relative 6 %   Monocytes Absolute 0.3 0.2 - 1.0 K/uL   Eosinophils Relative 4 %   Eosinophils Absolute 0.2 0 - 0.7 K/uL   Basophils Relative 1 %   Basophils Absolute 0.0 0 - 0.1 K/uL   ____________________________________________  EKG My review and personal interpretation at Time: 11:29   Indication: sob  Rate: 80  Rhythm: sinus Axis: normal Other: ivcd non specific st changes, no stemi ____________________________________________  RADIOLOGY  I personally reviewed all radiographic images ordered to evaluate for the above acute complaints and reviewed radiology reports and findings.  These findings were personally discussed with the patient.  Please see medical record for radiology report.  EMERGENCY DEPARTMENT [place] LUNG EXAM "Study: Limited Ultrasound of the Lung and Thorax"  INDICATIONS: Dyspnea Multiple views of both lungs using sagittal orientation were obtained.  PERFORMED BY: Myself IMAGES ARCHIVED?: Yes LIMITATIONS: None VIEWS USED: Posterior lung fields INTERPRETATION: Pleural effusion       ____________________________________________   PROCEDURES  Procedure(s) performed:  Procedures    Critical Care performed: no ____________________________________________   INITIAL IMPRESSION / ASSESSMENT AND PLAN / ED COURSE  Pertinent labs & imaging results that were available during my care of the patient were reviewed by  me and considered in my medical decision making (see chart for details).  DDX: Asthma, copd, CHF, pna, ptx, malignancy, Pe, anemia   Haylen Shelnutt is a 76 y.o. who presents to the ED with acute on chronic hypoxic respiratory failure.  Patient seems to have significantly declined in his health over the past several months as I have seen him, however he does still have his humor and seems in good spirits.  On exam of presentation I do believe that his shortness of breath and worsening respiratory failure is secondary to recurrent pleural effusion.  Clinical Course as of Mar 14 1501  Fri Mar 14, 2017  1314 Some presentation I am concerned the patient's worsening shortness of breath is secondary to recurrent pleural effusion.  Will have IR performed ultrasound guided thoracentesis.  No white count.  Less consistent with pneumonia.  [PR]  1339 patient reassessed and states that he feels markedly improved.  [PR]  45 With Dr. Zollie Scale of nephrology regarding the presentation.  We arrange for the patient have early dialysis first thing in the morning.  Patient with significant improvement in symptoms.  No pain.  At this point I do believe patient is stable and appropriate for discharge with dialysis tomorrow morning.  Discussed signs and symptoms for which he should return immediately to the hospital  [PR]    Clinical Course User Index [PR] Merlyn Lot, MD     ____________________________________________   FINAL CLINICAL IMPRESSION(S) / ED DIAGNOSES  Final diagnoses:  Pleural effusion  SOB (shortness of breath)  Acute on chronic respiratory failure with hypoxia ([place])  ESRD on dialysis ([place])      NEW MEDICATIONS STARTED DURING THIS VISIT:  This SmartLink is deprecated. Use AVSMEDLIST instead to display the medication list for a patient.   Note:  This document was prepared using Dragon voice recognition software and may include unintentional dictation errors.    Merlyn Lot,  MD 03/14/17 Darrol Angel    Merlyn Lot, MD 03/14/17 [phone]

## 2017-03-14 NOTE — Discharge Instructions (Signed)
Follow-up with your dialysis center tomorrow morning.  Return immediately for any worsening shortness of breath chest pains or for any additional concerns.

## 2017-03-14 NOTE — ED Notes (Signed)
Pt taken for procedure 

## 2017-03-14 NOTE — ED Triage Notes (Signed)
Pt comes into the ED via PI[facility] c/o shortness of breath that has progressed over the past day or two.  Patient has h/o thoracentesis done on a regular basis and patient currently wears 6L [place] chronically.  Patient presents with a dusky color to this triage RN.  H/o pneumonia, TALC procedure, and COPD.  Patient has DM, CHS, and renal disorder. Patient labored in breathing at this time.

## 2017-03-15 ENCOUNTER — Inpatient Hospital Stay
Admission: EM | Admit: 2017-03-15 | Discharge: 2017-03-16 | DRG: 640 | Disposition: A | Discharge status: Home Health | Payer: Medicare Other | Attending: Internal Medicine | Admitting: Internal Medicine

## 2017-03-15 ENCOUNTER — Other Ambulatory Visit: Payer: Self-pay

## 2017-03-15 ENCOUNTER — Emergency Department: Payer: Medicare Other

## 2017-03-15 ENCOUNTER — Encounter: Payer: Self-pay | Admitting: Intensive Care

## 2017-03-15 DIAGNOSIS — Z79899 Other long term (current) drug therapy: Secondary | ICD-10-CM

## 2017-03-15 DIAGNOSIS — E8779 Other fluid overload: Principal | ICD-10-CM | POA: Diagnosis present

## 2017-03-15 DIAGNOSIS — N2581 Secondary hyperparathyroidism of renal origin: Secondary | ICD-10-CM | POA: Diagnosis present

## 2017-03-15 DIAGNOSIS — D631 Anemia in chronic kidney disease: Secondary | ICD-10-CM | POA: Diagnosis present

## 2017-03-15 DIAGNOSIS — J810 Acute pulmonary edema: Secondary | ICD-10-CM | POA: Diagnosis present

## 2017-03-15 DIAGNOSIS — R0602 Shortness of breath: Secondary | ICD-10-CM | POA: Diagnosis present

## 2017-03-15 DIAGNOSIS — R34 Anuria and oliguria: Secondary | ICD-10-CM | POA: Diagnosis present

## 2017-03-15 DIAGNOSIS — I160 Hypertensive urgency: Secondary | ICD-10-CM | POA: Diagnosis present

## 2017-03-15 DIAGNOSIS — E1122 Type 2 diabetes mellitus with diabetic chronic kidney disease: Secondary | ICD-10-CM | POA: Diagnosis present

## 2017-03-15 DIAGNOSIS — E785 Hyperlipidemia, unspecified: Secondary | ICD-10-CM | POA: Diagnosis present

## 2017-03-15 DIAGNOSIS — Z9981 Dependence on supplemental oxygen: Secondary | ICD-10-CM | POA: Diagnosis not present

## 2017-03-15 DIAGNOSIS — I2510 Atherosclerotic heart disease of native coronary artery without angina pectoris: Secondary | ICD-10-CM | POA: Diagnosis present

## 2017-03-15 DIAGNOSIS — Z87891 Personal history of nicotine dependence: Secondary | ICD-10-CM

## 2017-03-15 DIAGNOSIS — Z794 Long term (current) use of insulin: Secondary | ICD-10-CM

## 2017-03-15 DIAGNOSIS — J811 Chronic pulmonary edema: Secondary | ICD-10-CM | POA: Diagnosis present

## 2017-03-15 DIAGNOSIS — K219 Gastro-esophageal reflux disease without esophagitis: Secondary | ICD-10-CM | POA: Diagnosis present

## 2017-03-15 DIAGNOSIS — Z66 Do not resuscitate: Secondary | ICD-10-CM | POA: Diagnosis present

## 2017-03-15 DIAGNOSIS — J440 Chronic obstructive pulmonary disease with acute lower respiratory infection: Secondary | ICD-10-CM | POA: Diagnosis present

## 2017-03-15 DIAGNOSIS — Z800 Family history of malignant neoplasm of digestive organs: Secondary | ICD-10-CM

## 2017-03-15 DIAGNOSIS — I480 Paroxysmal atrial fibrillation: Secondary | ICD-10-CM | POA: Diagnosis present

## 2017-03-15 DIAGNOSIS — Z881 Allergy status to other antibiotic agents status: Secondary | ICD-10-CM

## 2017-03-15 DIAGNOSIS — J9811 Atelectasis: Secondary | ICD-10-CM | POA: Diagnosis present

## 2017-03-15 DIAGNOSIS — R300 Dysuria: Secondary | ICD-10-CM | POA: Diagnosis present

## 2017-03-15 DIAGNOSIS — I5032 Chronic diastolic (congestive) heart failure: Secondary | ICD-10-CM | POA: Diagnosis present

## 2017-03-15 DIAGNOSIS — Z8546 Personal history of malignant neoplasm of prostate: Secondary | ICD-10-CM

## 2017-03-15 DIAGNOSIS — Z808 Family history of malignant neoplasm of other organs or systems: Secondary | ICD-10-CM

## 2017-03-15 DIAGNOSIS — I132 Hypertensive heart and chronic kidney disease with heart failure and with stage 5 chronic kidney disease, or end stage renal disease: Secondary | ICD-10-CM | POA: Diagnosis present

## 2017-03-15 DIAGNOSIS — Z992 Dependence on renal dialysis: Secondary | ICD-10-CM | POA: Diagnosis not present

## 2017-03-15 DIAGNOSIS — N186 End stage renal disease: Secondary | ICD-10-CM

## 2017-03-15 DIAGNOSIS — Z9079 Acquired absence of other genital organ(s): Secondary | ICD-10-CM

## 2017-03-15 HISTORY — DX: End stage renal disease: N18.6

## 2017-03-15 LAB — CBC
HEMATOCRIT: 25.5 % — AB (ref 40.0–52.0)
HEMOGLOBIN: 8.1 g/dL — AB (ref 13.0–18.0)
MCH: 26.9 pg (ref 26.0–34.0)
MCHC: 31.9 g/dL — ABNORMAL LOW (ref 32.0–36.0)
MCV: 84.4 fL (ref 80.0–100.0)
Platelets: 140 10*3/uL — ABNORMAL LOW (ref 150–440)
RBC: 3.03 MIL/uL — AB (ref 4.40–5.90)
RDW: 19.2 % — ABNORMAL HIGH (ref 11.5–14.5)
WBC: 7.4 10*3/uL (ref 3.8–10.6)

## 2017-03-15 LAB — BASIC METABOLIC PANEL
ANION GAP: 12 (ref 5–15)
BUN: 62 mg/dL — ABNORMAL HIGH (ref 6–20)
CALCIUM: 9.1 mg/dL (ref 8.9–10.3)
CO2: 25 mmol/L (ref 22–32)
Chloride: 99 mmol/L — ABNORMAL LOW (ref 101–111)
Creatinine, Ser: 6.08 mg/dL — ABNORMAL HIGH (ref 0.61–1.24)
GFR, EST AFRICAN AMERICAN: 9 mL/min — AB (ref >60)
GFR, EST NON AFRICAN AMERICAN: 8 mL/min — AB (ref >60)
GLUCOSE: 215 mg/dL — AB (ref 65–99)
POTASSIUM: 4.1 mmol/L (ref 3.5–5.1)
Sodium: 136 mmol/L (ref 135–145)

## 2017-03-15 LAB — TROPONIN I
TROPONIN I: 0.03 ng/mL — AB (ref <0.03)
Troponin I: 0.03 ng/mL (ref <0.03)

## 2017-03-15 LAB — GLUCOSE, CAPILLARY: GLUCOSE-CAPILLARY: 257 mg/dL — AB (ref 65–99)

## 2017-03-15 LAB — MRSA PCR SCREENING: MRSA by PCR: NEGATIVE

## 2017-03-15 MED ORDER — CARVEDILOL 6.25 MG PO TABS: 6.2500 mg | ORAL_TABLET | Freq: Two times a day (BID) | ORAL | Status: DC

## 2017-03-15 MED ORDER — FUROSEMIDE 40 MG PO TABS
40.0000 mg | ORAL_TABLET | Freq: Two times a day (BID) | ORAL | Status: DC
  Administered 2017-03-16: 40 mg via ORAL
  Filled 2017-03-15: qty 1

## 2017-03-15 MED ORDER — LIDOCAINE-PRILOCAINE 2.5-2.5 % EX CREA: TOPICAL_CREAM | CUTANEOUS | Status: DC | PRN

## 2017-03-15 MED ORDER — ATORVASTATIN CALCIUM 20 MG PO TABS
20.0000 mg | ORAL_TABLET | Freq: Every day | ORAL | Status: DC
  Administered 2017-03-15: 20 mg via ORAL
  Filled 2017-03-15: qty 1

## 2017-03-15 MED ORDER — ONDANSETRON HCL 4 MG PO TABS: 4.0000 mg | ORAL_TABLET | Freq: Four times a day (QID) | ORAL | Status: DC | PRN

## 2017-03-15 MED ORDER — CALCIUM ACETATE (PHOS BINDER) 667 MG PO CAPS
1334.0000 mg | ORAL_CAPSULE | Freq: Three times a day (TID) | ORAL | Status: DC
  Administered 2017-03-16 (×2): 1334 mg via ORAL
  Filled 2017-03-15 (×2): qty 2

## 2017-03-15 MED ORDER — ASPIRIN EC 81 MG PO TBEC
81.0000 mg | DELAYED_RELEASE_TABLET | Freq: Every day | ORAL | Status: DC
  Administered 2017-03-16: 81 mg via ORAL
  Filled 2017-03-15: qty 1

## 2017-03-15 MED ORDER — ACETAMINOPHEN 325 MG PO TABS: 650.0000 mg | ORAL_TABLET | Freq: Four times a day (QID) | ORAL | Status: DC | PRN

## 2017-03-15 MED ORDER — ISOSORBIDE MONONITRATE ER 30 MG PO TB24
30.0000 mg | ORAL_TABLET | Freq: Every day | ORAL | Status: DC
  Administered 2017-03-16: 30 mg via ORAL
  Filled 2017-03-15: qty 1

## 2017-03-15 MED ORDER — SPIRONOLACTONE 25 MG PO TABS
25.0000 mg | ORAL_TABLET | Freq: Two times a day (BID) | ORAL | Status: DC
  Administered 2017-03-16: 25 mg via ORAL
  Filled 2017-03-15: qty 1

## 2017-03-15 MED ORDER — LOSARTAN POTASSIUM 50 MG PO TABS
50.0000 mg | ORAL_TABLET | Freq: Every day | ORAL | Status: DC
  Administered 2017-03-15: 50 mg via ORAL
  Filled 2017-03-15: qty 1

## 2017-03-15 MED ORDER — PANTOPRAZOLE SODIUM 40 MG PO TBEC
40.0000 mg | DELAYED_RELEASE_TABLET | Freq: Two times a day (BID) | ORAL | Status: DC
  Administered 2017-03-15 – 2017-03-16 (×2): 40 mg via ORAL
  Filled 2017-03-15 (×2): qty 1

## 2017-03-15 MED ORDER — HEPARIN SODIUM (PORCINE) 5000 UNIT/ML IJ SOLN
5000.0000 [IU] | Freq: Three times a day (TID) | INTRAMUSCULAR | Status: DC
  Administered 2017-03-15 – 2017-03-16 (×2): 5000 [IU] via SUBCUTANEOUS
  Filled 2017-03-15 (×2): qty 1

## 2017-03-15 MED ORDER — ONDANSETRON HCL 4 MG/2ML IJ SOLN: 4.0000 mg | Freq: Four times a day (QID) | INTRAMUSCULAR | Status: DC | PRN

## 2017-03-15 MED ORDER — INSULIN ASPART 100 UNIT/ML ~~LOC~~ SOLN
0.0000 [IU] | Freq: Every day | SUBCUTANEOUS | Status: DC
  Administered 2017-03-15: 3 [IU] via SUBCUTANEOUS
  Filled 2017-03-15: qty 1

## 2017-03-15 MED ORDER — LIDOCAINE-PRILOCAINE 2.5-2.5 % EX CREA
TOPICAL_CREAM | CUTANEOUS | Status: DC | PRN
  Filled 2017-03-15: qty 5

## 2017-03-15 MED ORDER — IPRATROPIUM-ALBUTEROL 0.5-2.5 (3) MG/3ML IN SOLN: 3.0000 mL | Freq: Four times a day (QID) | RESPIRATORY_TRACT | Status: DC | PRN

## 2017-03-15 MED ORDER — DOXAZOSIN MESYLATE 4 MG PO TABS
8.0000 mg | ORAL_TABLET | Freq: Every evening | ORAL | Status: DC
  Administered 2017-03-15: 8 mg via ORAL
  Filled 2017-03-15 (×2): qty 2
  Filled 2017-03-15: qty 1

## 2017-03-15 MED ORDER — AMIODARONE HCL 200 MG PO TABS
100.0000 mg | ORAL_TABLET | Freq: Every day | ORAL | Status: DC
  Administered 2017-03-16: 100 mg via ORAL
  Filled 2017-03-15: qty 1

## 2017-03-15 MED ORDER — COLLAGENASE 250 UNIT/GM EX OINT
TOPICAL_OINTMENT | Freq: Every day | CUTANEOUS | Status: DC
  Administered 2017-03-15: 23:00:00 via TOPICAL
  Filled 2017-03-15: qty 30

## 2017-03-15 MED ORDER — INSULIN ASPART 100 UNIT/ML ~~LOC~~ SOLN
0.0000 [IU] | Freq: Three times a day (TID) | SUBCUTANEOUS | Status: DC
  Administered 2017-03-16: 2 [IU] via SUBCUTANEOUS
  Filled 2017-03-15 (×2): qty 1

## 2017-03-15 MED ORDER — HYDRALAZINE HCL 20 MG/ML IJ SOLN: 10.0000 mg | Freq: Four times a day (QID) | INTRAMUSCULAR | Status: DC | PRN

## 2017-03-15 MED ORDER — LIDOCAINE-PRILOCAINE (BULK) 2.5-2.5 % CREA: 1.0000 "application " | TOPICAL_CREAM | Status: DC | PRN

## 2017-03-15 MED ORDER — DEXTROSE 5 % IV SOLN
1.0000 g | Freq: Every day | INTRAVENOUS | Status: DC
  Filled 2017-03-15: qty 10

## 2017-03-15 NOTE — Progress Notes (Signed)
Pharmacy Antibiotic Note  Calvin Good is a 76 y.o. male admitted on 03/15/2017 with UTI.  Pharmacy has been consulted for ceftriaxone dosing.  Plan: Ceftriaxone 1 g iv q 24 hours.   Height: 5\' 8"  (172.7 cm) Weight: 214 lb 1.1 oz (97.1 kg) IBW/kg (Calculated) : 68.4  Temp (24hrs), Avg:98 F (36.7 C), Min:97.9 F (36.6 C), Max:98 F (36.7 C)  Recent Labs  Lab 03/14/17 1140 03/15/17 1124  WBC 6.2 7.4  CREATININE 5.01* 6.08*    Estimated Creatinine Clearance: 11.3 mL/min (A) (by C-G formula based on SCr of 6.08 mg/dL (H)).    Allergies  Allergen Reactions  . Tetracyclines & Related Other (See Comments)    Reaction: Unknown     Antimicrobials this admission: CTX 12/15 >>   Dose adjustments this admission:   Microbiology results: UCx: sent MRSA PCR: sent   Thank you for allowing pharmacy to be a part of this patient's care.  [name] 03/15/2017 8:50 PM

## 2017-03-15 NOTE — Progress Notes (Signed)
HD STARTED  

## 2017-03-15 NOTE — ED Provider Notes (Signed)
[facility] Emergency Department Provider Note       Time seen: ----------------------------------------- 11:31 AM on 03/15/2017 -----------------------------------------   I have reviewed the triage vital signs and the nursing notes.  HISTORY   Chief Complaint Shortness of Breath    HPI Calvin Good is a 76 y.o. male with a history of CHF, COPD and diabetes who presents to the ED for shortness of breath.  Patient had a paracentesis performed yesterday and he has scheduled dialysis on Monday, Wednesday and Friday.  Next treatment was due today since he had a procedure done yesterday.  He came to the hospital for increased shortness of breath with exertion.  Patient wears 6 L of oxygen all the time.  He reports he had 1 L removed via paracentesis yesterday.  Past Medical History:  Diagnosis Date  . Cancer [facility])    Prostate  . CHF (congestive heart failure) ([place])   . Chronic kidney disease   . Complication of anesthesia    hard to wake up  . COPD (chronic obstructive pulmonary disease) ([place])   . Diabetes mellitus without complication ([place])   . Difficult intubation   . Dyspnea   . GERD (gastroesophageal reflux disease)   . Hyperlipidemia   . Hypertension   . Pleural effusion   . Renal insufficiency     Patient Active Problem List   Diagnosis Date Noted  . Pressure injury of skin 01/28/2017  . ESRD on dialysis ([place])   . Recurrent pleural effusion on right   . Atrial fibrillation ([place]) 12/30/2016  . Atrial fibrillation with RVR ([place]) 11/25/2016  . A-fib ([place]) 10/30/2016  . Allergic rhinitis due to pollen 10/29/2016  . Chronic airway obstruction ([place]) 10/29/2016  . Chronic fatigue syndrome 10/29/2016  . Dependence on renal dialysis ([place]) 10/29/2016  . Diabetic neuropathy ([place]) 10/29/2016  . Diabetic renal disease ([place]) 10/29/2016  . Heart failure ([place]) 10/29/2016  . Hyperglycemia due to type 2 diabetes mellitus ([place]) 10/29/2016  . Incontinence  without sensory awareness 10/29/2016  . Long term current use of insulin ([place]) 10/29/2016  . Obstructive sleep apnea 10/29/2016  . Urinary incontinence 10/29/2016  . Acute respiratory failure ([place]) 10/16/2016  . Anxiety 10/10/2016  . Acute encephalopathy   . HCAP (healthcare-associated pneumonia) 09/14/2016  . Tobacco use 09/03/2016  . Aneurysm ([place]) 08/28/2016  . Pseudoaneurysm following procedure ([place]) 08/26/2016  . Chest tightness 08/20/2016  . GERD without esophagitis 08/20/2016  . NSTEMI (non-ST elevated myocardial infarction) ([place]) 08/20/2016  . Cancer of prostate ([place]) 05/16/2016  . Chronic diastolic CHF (congestive heart failure) ([place]) 05/16/2016  . Diabetic polyneuropathy associated with type 2 diabetes mellitus ([place]) 05/16/2016  . Onychomycosis of toenail 05/13/2016  . Essential hypertension, benign 04/23/2016  . Hyperlipidemia 04/23/2016  . Type 2 diabetes mellitus with chronic kidney disease on chronic dialysis, with long-term current use of insulin ([place]) 04/23/2016  . ESRD (end stage renal disease) ([place]) 04/23/2016    Past Surgical History:  Procedure Laterality Date  . APPENDECTOMY    . AV FISTULA PLACEMENT Left 05/30/2016   Procedure: ARTERIOVENOUS (AV) FISTULA CREATION ( BRACHIOCEPHALIC );  Surgeon: [name], MD;  Location: [facility] ORS;  Service: Vascular;  Laterality: Left;  . CAPD INSERTION N/A 05/30/2016   Procedure: LAPAROSCOPIC INSERTION CONTINUOUS AMBULATORY PERITONEAL DIALYSIS  (CAPD) CATHETER;  Surgeon: [name], MD;  Location: [facility] ORS;  Service: Vascular;  Laterality: N/A;  . DIALYSIS/PERMA CATHETER INSERTION    . DIALYSIS/PERMA CATHETER REMOVAL N/A 08/21/2016  Procedure: Dialysis/Perma Catheter Removal;  Surgeon: [name], MD;  Location: [place] [facility];  Service: Cardiovascular;  Laterality: N/A;  . JOINT REPLACEMENT     right knee   . LEFT HEART CATH AND CORONARY ANGIOGRAPHY N/A 08/21/2016   Procedure: Left Heart Cath and Coronary Angiography  possible PCI;  Surgeon: [name], MD;  Location: [place] [facility];  Service: Cardiovascular;  Laterality: N/A;  . postate removal     . PROSTATE SURGERY    . PSEUDOANERYSM COMPRESSION Right 08/27/2016   Procedure: Pseudoanerysm Compression;  Surgeon: Katha Cabal, MD;  Location: [place] [facility];  Service: Cardiovascular;  Laterality: Right;  . REMOVAL OF A DIALYSIS CATHETER N/A 08/08/2016   Procedure: REMOVAL OF A DIALYSIS CATHETER;  Surgeon: [name], MD;  Location: [facility] ORS;  Service: Vascular;  Laterality: N/A;  . VIDEO ASSISTED THORACOSCOPY (VATS)/THOROCOTOMY Right 01/06/2017   Procedure: PREOPERATIVE BRONCHOSCOPY, RIGHT VIDEO ASSISTED THORACOSCOPY (VATS) WITH TALC PLEURODESIS;  Surgeon: Nestor Lewandowsky, MD;  Location: [facility] ORS;  Service: General;  Laterality: Right;    Allergies Tetracyclines & related  Social History Social History   Tobacco Use  . Smoking status: Former Smoker    Types: Cigarettes    Last attempt to quit: 05/21/1982    Years since quitting: 34.8  . Smokeless tobacco: Never Used  Substance Use Topics  . Alcohol use: Yes    Alcohol/week: 1.8 oz    Types: 3 Glasses of wine per week  . Drug use: No    Review of Systems Constitutional: Negative for fever. Eyes: Negative for vision changes ENT:  Negative for congestion, sore throat Cardiovascular: Negative for chest pain. Respiratory: Positive for shortness of breath Gastrointestinal: Negative for abdominal pain, vomiting and diarrhea. Musculoskeletal: Negative for back pain.  Positive for edema Skin: Negative for rash. Neurological: Positive for generalized weakness  All systems negative/normal/unremarkable except as stated in the HPI  ____________________________________________   PHYSICAL EXAM:  VITAL SIGNS: ED Triage Vitals  Enc Vitals Group     BP 03/15/17 1121 (!) 180/67     Pulse Rate 03/15/17 1121 72     Resp 03/15/17 1121 (!) 22     Temp 03/15/17 1121 97.9 F  (36.6 C)     Temp Source 03/15/17 1121 Oral     SpO2 03/15/17 1121 95 %     Weight 03/15/17 1120 214 lb (97.1 kg)     Height 03/15/17 1120 5\' 8"  (1.727 m)     Head Circumference --      Peak Flow --      Pain Score --      Pain Loc --      Pain Edu? --      Excl. in [place]? --    Constitutional: Alert and oriented. Well appearing and in no distress. Eyes: Conjunctivae are normal. Normal extraocular movements. ENT   Head: Normocephalic and atraumatic.   Nose: No congestion/rhinnorhea.   Mouth/Throat: Mucous membranes are moist.   Neck: No stridor. Cardiovascular: Normal rate, regular rhythm. No murmurs, rubs, or gallops. Respiratory: Normal respiratory effort without tachypnea nor retractions. Breath sounds are clear and equal bilaterally. No wheezes/rales/rhonchi. Gastrointestinal: Soft and nontender. Normal bowel sounds Musculoskeletal: Nontender with normal range of motion in extremities. No lower extremity tenderness nor edema. Neurologic:  Normal speech and language. No gross focal neurologic deficits are appreciated.  Skin:  Skin is warm, dry and intact. No rash noted. Psychiatric: Mood and affect are normal. Speech and  behavior are normal.  ____________________________________________  EKG: Interpreted by me.  Sinus rhythm the rate is 72 bpm, normal PR interval, wide QRS, right bundle branch block, long QT  ____________________________________________  ED COURSE:  Pertinent labs & imaging results that were available during my care of the patient were reviewed by me and considered in my medical decision making (see chart for details). Patient presents for dyspnea, we will assess with labs and imaging as indicated. Clinical Course as of Mar 15 1336  Sat Mar 15, 2017  1233 Patient discussed with nephrology who will arrange for inpatient dialysis  [JW]    Clinical Course User Index [JW] Earleen Newport, MD    Procedures ____________________________________________   LABS (pertinent positives/negatives)  Labs Reviewed  BASIC METABOLIC PANEL - Abnormal; Notable for the following components:      Result Value   Chloride 99 (*)    Glucose, Bld 215 (*)    BUN 62 (*)    Creatinine, Ser 6.08 (*)    GFR calc non Af Amer 8 (*)    GFR calc Af Amer 9 (*)    All other components within normal limits  CBC - Abnormal; Notable for the following components:   RBC 3.03 (*)    Hemoglobin 8.1 (*)    HCT 25.5 (*)    MCHC 31.9 (*)    RDW 19.2 (*)    Platelets 140 (*)    All other components within normal limits  TROPONIN I - Abnormal; Notable for the following components:   Troponin I 0.03 (*)    All other components within normal limits    RADIOLOGY Images were viewed by me  Chest x-ray  IMPRESSION: Increasing right basilar infiltrate and effusion.  Increase in the degree of vascular congestion. ____________________________________________  DIFFERENTIAL DIAGNOSIS   Pulmonary edema, pneumonia, PE, pneumothorax, end-stage renal disease, CHF  FINAL ASSESSMENT AND PLAN  Dyspnea, pulmonary edema, end-stage renal disease on dialysis   Plan: Patient had presented for shortness of breath, particular with exertion. Patient's labs were as expected with end-stage renal disease. Patient's imaging revealed increasing right basilar infiltrate and effusion with pulmonary vascular congestion.  He has very dyspneic with any activity.  I discussed with nephrology who will arrange dialysis for him in the hospital.   Earleen Newport, MD   Note: This note was generated in part or whole with voice recognition software. Voice recognition is usually quite accurate but there are transcription errors that can and very often do occur. I apologize for any typographical errors that were not detected and corrected.     Earleen Newport, MD 03/15/17 [phone]

## 2017-03-15 NOTE — Progress Notes (Signed)
HD TX Ended  

## 2017-03-15 NOTE — ED Notes (Signed)
Admitting Provider at bedside. 

## 2017-03-15 NOTE — Progress Notes (Signed)
HD Post assessment

## 2017-03-15 NOTE — ED Notes (Signed)
Bed not assigned yet. Report given to dialysis RN and patient taken to dialysis

## 2017-03-15 NOTE — ED Notes (Signed)
Patient A&O x4. Pt repeatedly asking for food. MD made aware. MD reports patient can eat. Patient given sandwich tray and drink

## 2017-03-15 NOTE — Progress Notes (Signed)
Pre HD assessment  

## 2017-03-15 NOTE — Progress Notes (Signed)
HD Post Assessment

## 2017-03-15 NOTE — Progress Notes (Signed)
PRE HD   

## 2017-03-15 NOTE — ED Notes (Signed)
Spoke with lab about patients lab results not resulting yet. Lab reports they will run tests now

## 2017-03-15 NOTE — ED Triage Notes (Addendum)
Patient had paracentesis yesterday. Dialysis patient M,W,F and next treatment was due today since having procedure yesterday. Came to hospital for increased SOB with exertion. Lung sounds diminished in lower. HX CHF and COPD. Patient wears 6L continuously. EMS vitals 186/78, 292 blood sugar. A&O x4 upon arrival

## 2017-03-15 NOTE — H&P (Addendum)
[facility] [place] NAME: Calvin Good    MR#:  [id number]  DATE OF BIRTH:  06-11-1940  DATE OF ADMISSION:  03/15/2017  PRIMARY CARE PHYSICIAN: [name], MD   REQUESTING/REFERRING PHYSICIAN: Dr. Lenise Arena  CHIEF COMPLAINT:   Chief Complaint  Patient presents with  . Shortness of Breath    HISTORY OF PRESENT ILLNESS:  Calvin Good  is a 76 y.o. 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 Patient has chronic right pleural effusion status post talc pleurodesis but requiring still thoracentesis. He missed his dialysis yesterday to get to the hospital for thoracentesis. Almost 800 cc pleural fluid was removed. Today he went to dialysis Center but got very short of breath so he was sent to the hospital. Chest x-ray shows return of the effusion, worsening pulmonary edema. And also blood pressure is significantly elevated. Chest x-ray also reveals possible right basilar infiltrate. Patient complains of significant weakness and worsening dyspnea at home with even minimal exertion. Denies any fevers or chills. No nausea or vomiting. According to daughter, his urine output has significantly decreased, some dysuria noted and brownish color and different odor to the urine noted.  PAST MEDICAL HISTORY:   Past Medical History:  Diagnosis Date  . Cancer [facility])    Prostate  . CHF (congestive heart failure) ([place])   . Chronic kidney disease   . Complication of anesthesia    hard to wake up  . COPD (chronic obstructive pulmonary disease) ([place])   . Diabetes mellitus without complication ([place])   . Difficult intubation   . Dyspnea   . ESRD (end stage renal disease) ([place])    on Monday-wednesday- Friday  hemodialysis  . GERD (gastroesophageal reflux disease)   . Hyperlipidemia   . Hypertension   . Pleural effusion   . Renal insufficiency     PAST SURGICAL HISTORY:   Past Surgical History:  Procedure Laterality Date  . APPENDECTOMY    . AV FISTULA PLACEMENT Left 05/30/2016   Procedure: ARTERIOVENOUS (AV) FISTULA CREATION ( BRACHIOCEPHALIC );  Surgeon: [name], MD;  Location: [facility] ORS;  Service: Vascular;  Laterality: Left;  . CAPD INSERTION N/A 05/30/2016   Procedure: LAPAROSCOPIC INSERTION CONTINUOUS AMBULATORY PERITONEAL DIALYSIS  (CAPD) CATHETER;  Surgeon: [name], MD;  Location: [facility] ORS;  Service: Vascular;  Laterality: N/A;  . DIALYSIS/PERMA CATHETER INSERTION    . DIALYSIS/PERMA CATHETER REMOVAL N/A 08/21/2016   Procedure: Dialysis/Perma Catheter Removal;  Surgeon: [name], MD;  Location: [place] CV LAB;  Service: Cardiovascular;  Laterality: N/A;  . JOINT REPLACEMENT     right knee   . LEFT HEART CATH AND CORONARY ANGIOGRAPHY N/A 08/21/2016   Procedure: Left Heart Cath and Coronary Angiography possible PCI;  Surgeon: [name], MD;  Location: [place] [facility] LAB;  Service: Cardiovascular;  Laterality: N/A;  . postate removal     . PROSTATE SURGERY    . PSEUDOANERYSM COMPRESSION Right 08/27/2016   Procedure: Pseudoanerysm Compression;  Surgeon: Katha Cabal, MD;  Location: [place] CV LAB;  Service: Cardiovascular;  Laterality: Right;  . REMOVAL OF A DIALYSIS CATHETER N/A 08/08/2016   Procedure: REMOVAL OF A DIALYSIS CATHETER;  Surgeon: [name], MD;  Location: [facility] ORS;  Service: Vascular;  Laterality: N/A;  . VIDEO ASSISTED THORACOSCOPY (VATS)/THOROCOTOMY Right 01/06/2017   Procedure: PREOPERATIVE BRONCHOSCOPY, RIGHT VIDEO ASSISTED THORACOSCOPY (VATS) WITH TALC PLEURODESIS;  Surgeon: Nestor Lewandowsky, MD;  Location: [facility] ORS;  Service: General;  Laterality: Right;    SOCIAL HISTORY:   Social History   Tobacco Use  . Smoking status: Former  Smoker    Types: Cigarettes    Last attempt to quit: 05/21/1982    Years since quitting: 34.8  . Smokeless tobacco: Never Used  Substance Use Topics  . Alcohol use: Yes    Alcohol/week: 1.8 oz    Types: 3 Glasses of wine per week    FAMILY HISTORY:   Family History  Problem Relation Age of Onset  . Colon cancer Father   . Brain cancer Mother   . CAD Neg Hx   . Diabetes Neg Hx     DRUG ALLERGIES:   Allergies  Allergen Reactions  . Tetracyclines & Related Other (See Comments)    Reaction: Unknown     REVIEW OF SYSTEMS:   Review of Systems  Constitutional: Positive for chills. Negative for fever, malaise/fatigue and weight loss.  HENT: Negative for ear discharge, ear pain, hearing loss and nosebleeds.   Eyes: Negative for blurred vision, double vision and photophobia.  Respiratory: Positive for shortness of breath. Negative for cough, hemoptysis and wheezing.   Cardiovascular: Negative for chest pain, palpitations, orthopnea and leg swelling.  Gastrointestinal: Negative for abdominal pain, constipation, diarrhea, heartburn, melena, nausea and vomiting.  Genitourinary: Positive for dysuria. Negative for frequency and urgency.  Musculoskeletal: Positive for myalgias. Negative for back pain and neck pain.  Skin: Negative for rash.  Neurological: Negative for dizziness, tingling, tremors, sensory change, speech change and focal weakness.  Endo/Heme/Allergies: Does not bruise/bleed easily.  Psychiatric/Behavioral: Negative for depression.    MEDICATIONS AT HOME:   Prior to Admission medications   Medication Sig Start Date End Date Taking? Authorizing Provider  acetaminophen (TYLENOL) 325 MG tablet Take 2 tablets (650 mg total) by mouth every 6 (six) hours as needed for mild pain (or Fever >/= 101). 09/23/16  Yes Gouru, Illene Silver, MD  amiodarone (PACERONE) 200 MG tablet Take 1 tablet (200 mg total) by mouth daily. Patient taking differently: Take 100 mg by mouth daily.  01/15/17   Yes Hillary Bow, MD  aspirin EC 81 MG tablet Take 81 mg by mouth daily.   Yes [provider]  atorvastatin (LIPITOR) 20 MG tablet Take 20 mg by mouth at bedtime.    Yes [provider]  calcium acetate (PHOSLO) 667 MG capsule Take 1,334 mg by mouth 3 (three) times daily with meals.    Yes [provider]  carvedilol (COREG) 3.125 MG tablet Take 1 tablet (3.125 mg total) by mouth 2 (two) times daily. 8 am, 5 pm Patient taking differently: Take 6.25 mg by mouth 2 (two) times daily. 8 am, 5 pm 01/16/17  Yes Sudini, Alveta Heimlich, MD  collagenase (SANTYL) ointment Apply topically daily. 01/29/17  Yes Fritzi Mandes, MD  doxazosin (CARDURA) 8 MG tablet Take 8 mg by mouth every evening.    Yes [provider]  furosemide (LASIX) 40 MG tablet Take 40 mg by mouth 2 (two) times daily.    Yes [provider]  GUAIFENESIN 1200 PO Take 1,200 mg by mouth at bedtime.   Yes [provider]  Hydrocortisone (GERHARDT'S BUTT CREAM) CREA Apply 1 application topically 2 (two) times daily. 01/28/17  Yes Fritzi Mandes, MD  insulin lispro (HUMALOG) 100 UNIT/ML injection Inject 10 Units into the skin 3 (three) times daily before meals.   Yes [provider]  ipratropium-albuterol (DUONEB) 0.5-2.5 (3) MG/3ML SOLN Take 3 mLs by nebulization every 6 (six) hours as needed. 09/23/16  Yes Gouru, Illene Silver, MD  isosorbide mononitrate (IMDUR) 30 MG 24 hr tablet Take 30 mg by mouth daily.  11/28/16  Yes [provider]  Lidocaine-Prilocaine, Bulk, 2.5-2.5 % CREA Apply 1 application topically as needed. Apply to fistula site prior to dialysis treatments as needed   Yes [provider]  lisinopril (PRINIVIL,ZESTRIL) 20 MG tablet Take 1 tablet by mouth daily. 12/10/16 12/10/17 Yes [provider]  omeprazole (PRILOSEC) 20 MG capsule Take 20 mg by mouth 2 (two) times daily before a meal.    Yes [provider]  OXYGEN Inhale 6 L into the lungs  continuous.    Yes [provider]  spironolactone (ALDACTONE) 25 MG tablet Take 25 mg by mouth 2 (two) times daily.    Yes [provider]  traMADol (ULTRAM) 50 MG tablet Take 1 tablet (50 mg total) by mouth every 12 (twelve) hours as needed for moderate pain or severe pain. Patient not taking: Reported on 03/14/2017 01/16/17 01/16/18  Toni Arthurs, NP      VITAL SIGNS:  Blood pressure (!) 164/60, pulse 69, temperature 97.9 F (36.6 C), temperature source Oral, resp. rate 20, height 5\' 8"  (1.727 m), weight 97.1 kg (214 lb), SpO2 100 %.  PHYSICAL EXAMINATION:   Physical Exam  GENERAL:  76 y.o.-year-old elderly patient lying in the bed with no acute distress.  EYES: Pupils equal, round, reactive to light and accommodation. No scleral icterus. Extraocular muscles intact.  HEENT: Head atraumatic, normocephalic. Oropharynx and nasopharynx clear.  NECK:  Supple, no jugular venous distention. No thyroid enlargement, no tenderness.  LUNGS: Normal breath sounds bilaterally except right base, no wheezing, rales,rhonchi or crepitation. Using accessory muscles to breathe with minimal exertion. Bibasilar crackles present  CARDIOVASCULAR: S1, S2 normal. No rubs, or gallops. 2/6 systolic murmur is present ABDOMEN: Soft, nontender, nondistended. Bowel sounds present. No organomegaly or mass.  EXTREMITIES: No cyanosis, or clubbing. 2+ bilateral pedal edema -Left arm AV fistula NEUROLOGIC: Cranial nerves II through XII are intact. Muscle strength 5/5 in all extremities. Sensation intact. Gait not checked. Global weakness noted PSYCHIATRIC: The patient is alert and oriented x 3.  SKIN: No obvious rash, lesion, or ulcer.   LABORATORY PANEL:   CBC Recent Labs  Lab 03/15/17 1124  WBC 7.4  HGB 8.1*  HCT 25.5*  PLT 140*   ------------------------------------------------------------------------------------------------------------------  Chemistries  Recent Labs  Lab  03/15/17 1124  NA 136  K 4.1  CL 99*  CO2 25  GLUCOSE 215*  BUN 62*  CREATININE 6.08*  CALCIUM 9.1   ------------------------------------------------------------------------------------------------------------------  Cardiac Enzymes Recent Labs  Lab 03/15/17 1124  TROPONINI 0.03*   ------------------------------------------------------------------------------------------------------------------  RADIOLOGY:  Dg Chest 1 View  Result Date: 03/14/2017 CLINICAL DATA:  Status post thoracentesis on the right . EXAM: CHEST 1 VIEW COMPARISON:  Ultrasound 03/14/2017.  Chest x-ray 03/14/2017. FINDINGS: Cardiomegaly with pulmonary vascular prominence and bilateral interstitial prominence with bilateral pleural effusions. Findings suggest CHF. Interim decrease in size of right pleural effusion. No evidence of pneumothorax post thoracentesis. IMPRESSION: 1. Interim decrease in right-sided pleural effusion following thoracentesis. No pneumothorax. 2. Congestive heart failure bilateral from interstitial edema and bilateral pleural effusions. Electronically Signed   By: Marcello Moores  Register   On: 03/14/2017 13:47  Dg Chest 2 View  Result Date: 03/15/2017 CLINICAL DATA:  Increase shortness of Breath EXAM: CHEST  2 VIEW COMPARISON:  03/14/2017 FINDINGS: Cardiac shadow is enlarged. Aortic calcifications are again seen. Increase in vascular congestion and interstitial edema is noted. There is also increased density over the right lung base consistent with enlarging effusion as well as focal right lower lobe infiltrate. IMPRESSION: Increasing right basilar infiltrate and effusion. Increase in the degree of vascular congestion. Electronically Signed   By: Inez Catalina M.D.   On: 03/15/2017 11:59   Dg Chest Portable 1 View  Result Date: 03/14/2017 CLINICAL DATA:  Dyspnea, worsening EXAM: PORTABLE CHEST 1 VIEW COMPARISON:  02/14/2017 chest radiograph. FINDINGS: Stable cardiomediastinal silhouette with mild  cardiomegaly and aortic atherosclerosis. No pneumothorax. Small right pleural effusion. No left pleural effusion. New patchy right lung base consolidation. No overt pulmonary edema. IMPRESSION: 1. New patchy right lung base consolidation, worrisome for aspiration or pneumonia. Follow-up chest radiographs recommended to resolution. 2. Stable cardiomegaly without overt pulmonary edema. 3. Small right pleural effusion. Electronically Signed   By: Ilona Sorrel M.D.   On: 03/14/2017 12:34   US Thoracentesis Asp Pleural Space W/img Guide  Result Date: 03/14/2017 CLINICAL DATA:  Recurrent right pleural effusion with dyspnea hypoxia. EXAM: ULTRASOUND GUIDED RIGHT THORACENTESIS COMPARISON:  Chest x-ray earlier today. PROCEDURE: An ultrasound guided thoracentesis was thoroughly discussed with the patient and questions answered. The benefits, risks, alternatives and complications were also discussed. The patient understands and wishes to proceed with the procedure. Written consent was obtained. Ultrasound was performed to localize and mark an adequate pocket of fluid in the right chest. The area was then prepped and draped in the normal sterile fashion. 1% Lidocaine was used for local anesthesia. Under ultrasound guidance a 6 French Safe-T-Centesis catheter was introduced. Thoracentesis was performed. The catheter was removed and a dressing applied. COMPLICATIONS: None FINDINGS: A total of approximately 800 mL of clear, amber fluid was removed. IMPRESSION: Successful ultrasound guided right thoracentesis yielding 800 mL of pleural fluid. Electronically Signed   By: Aletta Edouard M.D.   On: 03/14/2017 14:46    EKG:   Orders placed or performed during the hospital encounter of 03/15/17  . EKG 12-Lead  . EKG 12-Lead  . ED EKG within 10 minutes  . ED EKG within 10 minutes    IMPRESSION AND PLAN:   Calvin Good  is a 76 y.o. 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  : 1. Acute pulmonary edema-secondary to fluid overload, missed dialysis yesterday. -Chest x-ray with return of the pleural effusion and pulmonary edema. -800 cc right pleural fluid removed by thoracentesis on 03/14/2017. Agree with dialysis today and likely tomorrow as well. -Nephrology consulted. -Continue 6 L chronic oxygen at this time. -Also right basilar infiltrate noted on chest x-ray which is likely atelectasis from prior chronic right pleural effusion. His prior x-rays also has right basilar infiltrate but it looks slightly worsened from the past. -Started on Rocephin  for now  2. End-stage renal disease on hemodialysis-nephrology consulted. For dialysis today. -Reevaluate for dialysis tomorrow. -Makes urine at baseline, however has been oliguric with dysuria over the last couple of weeks. Check urine analysis if possible and already on Rocephin.  3. Hypertensive urgency-continue home medications. On Coreg, Cardura, Lasix and  Imdur. Losartan started and stroke lisinopril.  4. Atrial fibrillation-has paroxysmal A. fib. Currently in normal sinus rhythm. Continue amiodarone and Coreg for now. Not on chronic anticoagulation due to history of GI bleed. Continue aspirin for now.  5. DVT prophylaxis-on subcutaneous heparin  Physical therapy consult. -Discussed CODE STATUS with patient and his family at bedside. He wants to be a DNR    All the records are reviewed and case discussed with ED provider. Management plans discussed with the patient, family and they are in agreement.  CODE STATUS: DO NOT RESUSCITATE  TOTAL TIME TAKING CARE OF THIS PATIENT: 50 minutes.    Gladstone Lighter M.D on 03/15/2017 at 2:46 PM  Between 7am to 6pm - Pager - [phone]  After 6pm go to [email or web address]  - password [email or web address] [facility]  Office  [phone]  CC: Primary care physician; [name], MD

## 2017-03-16 DIAGNOSIS — R0602 Shortness of breath: Secondary | ICD-10-CM | POA: Diagnosis not present

## 2017-03-16 DIAGNOSIS — E8779 Other fluid overload: Secondary | ICD-10-CM | POA: Diagnosis not present

## 2017-03-16 LAB — TROPONIN I
TROPONIN I: 0.03 ng/mL — AB (ref <0.03)
TROPONIN I: 0.03 ng/mL — AB (ref <0.03)

## 2017-03-16 LAB — CBC
HEMATOCRIT: 25.4 % — AB (ref 40.0–52.0)
Hemoglobin: 8.3 g/dL — ABNORMAL LOW (ref 13.0–18.0)
MCH: 27 pg (ref 26.0–34.0)
MCHC: 32.6 g/dL (ref 32.0–36.0)
MCV: 83 fL (ref 80.0–100.0)
Platelets: 139 10*3/uL — ABNORMAL LOW (ref 150–440)
RBC: 3.06 MIL/uL — ABNORMAL LOW (ref 4.40–5.90)
RDW: 19.4 % — AB (ref 11.5–14.5)
WBC: 6.3 10*3/uL (ref 3.8–10.6)

## 2017-03-16 LAB — BASIC METABOLIC PANEL
Anion gap: 10 (ref 5–15)
BUN: 36 mg/dL — AB (ref 6–20)
CO2: 31 mmol/L (ref 22–32)
Calcium: 8.7 mg/dL — ABNORMAL LOW (ref 8.9–10.3)
Chloride: 99 mmol/L — ABNORMAL LOW (ref 101–111)
Creatinine, Ser: 3.78 mg/dL — ABNORMAL HIGH (ref 0.61–1.24)
GFR calc Af Amer: 16 mL/min — ABNORMAL LOW (ref >60)
GFR calc non Af Amer: 14 mL/min — ABNORMAL LOW (ref >60)
GLUCOSE: 231 mg/dL — AB (ref 65–99)
POTASSIUM: 3.7 mmol/L (ref 3.5–5.1)
Sodium: 140 mmol/L (ref 135–145)

## 2017-03-16 LAB — GLUCOSE, CAPILLARY
GLUCOSE-CAPILLARY: 184 mg/dL — AB (ref 65–99)
Glucose-Capillary: 230 mg/dL — ABNORMAL HIGH (ref 65–99)

## 2017-03-16 MED ORDER — LOSARTAN POTASSIUM 50 MG PO TABS: 50.0000 mg | ORAL_TABLET | Freq: Every day | ORAL | 1 refills | Status: DC

## 2017-03-16 NOTE — Care Management Note (Signed)
Case Management Note  Patient Details  Name: Afshin Chrystal MRN: [id number] Date of Birth: 05-Jul-1940  Subjective/Objective:                 Chronic  ESRD with HD at [facility]  Patient currently receiving home health services through [facility] and PT, OT. Chronic home oxygen with [facility].  Followed by outpatient [place] palliative.Had talc pleurodesis during last admission but continues to require thoracentesis- 800 cc fluid removed 12/14..  Missed dialysis on Friday.  Patient is now DNR   Action/Plan:   Expected Discharge Date:                  Expected Discharge Plan:     In-House Referral:     Discharge planning Services     Post Acute Care Choice:    Choice offered to:     DME Arranged:    DME Agency:     HH Arranged:    HH Agency:     Status of Service:     If discussed at [employer], dates discussed:    Additional Comments:  Katrina Stack, RN 03/16/2017, 9:46 AM

## 2017-03-16 NOTE — Evaluation (Signed)
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  Assessment includes: Acute pulmonary edema-secondary to fluid overload, 800 cc right pleural fluid removed by thoracentesis on 03/14/2017, ESRD on HD [facility], [facility], and F, HTN, and A-fib.     Clinical Impression  Pt presents with mild deficits in strength, gait, and balance, and moderate to severe deficits in activity tolerance.  Pt found on 4.5LO2/min in room with pt reporting uses 6LO2/min at home.  Pt's baseline SpO2 93% and HR 70 bpm at rest on 4.5LO2/min.  Nursing contacted and approved increasing SpO2 PRN up to 6LO2/min during activity.  After bed mobility, sit to/from stand transfers, and seated therex pt's SpO2 decreased to 87-88% on 4.5LO2/min.  Pt's O2 increased to 6LO2/min and returned quickly to baseline.  Pt ambulated 1 x 50' and 1 x 46' with good stability and without AD.  Pt's SpO2 remained in low 90's with appropriate HR during amb on 6LO2/min.  At end of session pt returned to 4.5LO2/min with SpO2 95% and HR 70 bpm with no adverse symptoms noted other than fatigue, nursing notified.  Pt will benefit from HHPT services upon discharge to safely address above deficits for decreased caregiver assistance and eventual return to PLOF.      Follow Up Recommendations Home health PT    Equipment Recommendations  None recommended by PT    Recommendations for Other Services       Precautions / Restrictions Precautions Precautions: None Restrictions Weight Bearing Restrictions: No      Mobility  Bed Mobility Overal bed mobility: Independent              General bed mobility comments: Good effort and speed with bed mobility tasks  Transfers Overall transfer level: Independent Equipment used: None             General transfer comment: Good stability as well as concentric and eccentric control  Ambulation/Gait Ambulation/Gait assistance: Supervision Ambulation Distance (Feet): 75 Feet Assistive device: None Gait Pattern/deviations: Decreased step length - right;Decreased step length - left;Step-through pattern     General Gait Details: Slow cadence with gait but steady without LOB without AD  Stairs Stairs: (Deferred)          Wheelchair Mobility    Modified Rankin (Stroke Patients Only)       Balance Overall balance assessment: Needs assistance Sitting-balance support: Feet unsupported;Feet supported;No upper extremity supported Sitting balance-Leahy Scale: Normal     Standing balance support: No upper extremity supported;During functional activity Standing balance-Leahy Scale: Good                               Pertinent Vitals/Pain Pain Assessment: No/denies pain    Home Living Family/patient expects to be discharged to:: Private residence Living Arrangements: Children Available Help at Discharge: Family;Available 24 hours/day Type of Home: House Home Access: Stairs to enter Entrance Stairs-Rails: Right;Left;Can reach both(Pt reports uses BUEs on one rail) Entrance Stairs-Number of Steps: 3-4 Home Layout: Two level;Able to live on main level with bedroom/bathroom Home Equipment: Kasandra Knudsen - single point;Shower seat;Grab bars - tub/shower;Grab bars - toilet;Wheelchair - manual  Prior Function Level of Independence: Independent         Comments: Ind with Amb without AD limited community distances, portable O2, no fall history, Ind with ADLs, daughter assists with transportation     Hand Dominance   Dominant Hand: Right    Extremity/Trunk Assessment   Upper Extremity  Assessment Upper Extremity Assessment: Overall WFL for tasks assessed    Lower Extremity Assessment Lower Extremity Assessment: Generalized weakness       Communication   Communication: No difficulties  Cognition Arousal/Alertness: Awake/alert Behavior During Therapy: WFL for tasks assessed/performed Overall Cognitive Status: Within Functional Limits for tasks assessed                                        General Comments      Exercises Total Joint Exercises Ankle Circles/Pumps: AROM;Both;10 reps Hip ABduction/ADduction: AROM;Both;5 reps Long Arc Quad: AROM;Both;10 reps Knee Flexion: AROM;Both;10 reps Marching in Standing: AROM;Both;10 reps(Seated) Other Exercises Other Exercises: Principles of dyspnea spiral discussed Other Exercises: Education provided regarding physiological benefits of activity and activity progression   Assessment/Plan    PT Assessment Patient needs continued PT services  PT Problem List Decreased strength;Decreased activity tolerance;Decreased balance       PT Treatment Interventions DME instruction;Gait training;Stair training;Neuromuscular re-education;Balance training;Functional mobility training;Therapeutic exercise;Therapeutic activities;Patient/family education    PT Goals (Current goals can be found in the Care Plan section)  Acute Rehab PT Goals Patient Stated Goal: To improve endurance PT Goal Formulation: With patient Time For Goal Achievement: 03/29/17 Potential to Achieve Goals: Good    Frequency Min 2X/week   Barriers to discharge        Co-evaluation               AM-PAC PT "6 Clicks" Daily Activity  Outcome Measure Difficulty turning over in bed (including adjusting bedclothes, sheets and blankets)?: None Difficulty moving from lying on back to sitting on the side of the bed? : None Difficulty sitting down on and standing up from a chair with arms (e.g., wheelchair, bedside commode, etc,.)?:  None Help needed moving to and from a bed to chair (including a wheelchair)?: None Help needed walking in hospital room?: A Little Help needed climbing 3-5 steps with a railing? : A Little 6 Click Score: 22    End of Session Equipment Utilized During Treatment: Gait belt;Oxygen Activity Tolerance: Patient limited by fatigue Patient left: in chair;with call bell/phone within reach Nurse Communication: Mobility status;Other (comment)(Spo2 and HR response to activity) PT Visit Diagnosis: Muscle weakness (generalized) (M62.81);Difficulty in walking, not elsewhere classified (R26.2)    Time: 5784-6962 PT Time Calculation (min) (ACUTE ONLY): 40 min   Charges:   PT Evaluation $PT Eval Low Complexity: 1 Low PT Treatments $Therapeutic Exercise: 8-22 mins   PT G Codes:   PT G-Codes **NOT FOR INPATIENT CLASS** Functional Assessment Tool Used: AM-PAC 6 Clicks Basic Mobility Functional Limitation: Mobility: Walking and moving around Mobility: Walking and Moving Around Current Status ([id number]): At least 20 percent but less than 40 percent impaired, limited or restricted Mobility: Walking and Moving Around Goal Status [phone]): 0 percent impaired, limited or restricted    D. Royetta Asal PT, DPT 03/16/17, 12:40 PM

## 2017-03-16 NOTE — Discharge Summary (Addendum)
[place] [facility] [place] NAME: Calvin Good    MR#:  [id number]  DATE OF BIRTH:  10/31/1940  DATE OF ADMISSION:  03/15/2017 ADMITTING PHYSICIAN: Gladstone Lighter, MD  DATE OF DISCHARGE: 03/16/17 PRIMARY CARE PHYSICIAN: [name], MD    ADMISSION DIAGNOSIS:  Acute pulmonary edema ([place]) [J81.0] End stage renal disease on dialysis ([place]) [N18.6, Z99.2]  DISCHARGE DIAGNOSIS:  Acute Pulmonary edema due to missing HD---resolved Recurrent Right pleural effusion s/p thoracentesis 2 days ago  SECONDARY DIAGNOSIS:   Past Medical History:  Diagnosis Date  . Cancer [facility])    Prostate  . CHF (congestive heart failure) ([place])   . Chronic kidney disease   . Complication of anesthesia    hard to wake up  . COPD (chronic obstructive pulmonary disease) ([place])   . Diabetes mellitus without complication ([place])   . Difficult intubation   . Dyspnea   . ESRD (end stage renal disease) ([place])    on Monday-wednesday- Friday hemodialysis  . GERD (gastroesophageal reflux disease)   . Hyperlipidemia   . Hypertension   . Pleural effusion   . 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  1. Acute pulmonary edema-secondary to fluid overload, missed dialysis x1. -Chest x-ray with return of the pleural effusion and pulmonary edema. -800 cc right pleural fluid removed by thoracentesis on 03/14/2017. -HD y'day with UF of >2 liters. Feels better -Nephrology consulted. -Continue6L chronic oxygen at this time. -do not see s/s of pneumonia d/c on Rocephin   2. End-stage renal disease on hemodialysis-nephrology consulted.  -Reevaluate for  dialysis today -Makes urine at baseline, however has been oliguric with dysuria over the last couple of weeks.   3. Hypertensive urgency-continue home medications. - On Coreg, Cardura, Lasix and Imdur. Losartan started   4. Atrial fibrillation-has paroxysmal A. fib. Currently in normal sinus rhythm.  -Continue amiodarone and Coreg for now. Not on chronic anticoagulation due to history of GI bleed. Continue aspirin for now.  5. DVT prophylaxis-on subcutaneous heparin  Physical therapy recommends HHPT/OT and RN  D/c home CONSULTS OBTAINED:  Treatment Team:  Anthonette Legato, MD  DRUG ALLERGIES:   Allergies  Allergen Reactions  . Tetracyclines & Related Other (See Comments)    Reaction: Unknown     DISCHARGE MEDICATIONS:   Allergies as of 03/16/2017      Reactions   Tetracyclines & Related Other (See Comments)   Reaction: Unknown      Medication List    STOP taking these medications   lisinopril 20 MG tablet Commonly known as:  PRINIVIL,ZESTRIL   traMADol 50 MG tablet Commonly known as:  ULTRAM     TAKE these medications   acetaminophen 325 MG tablet Commonly known as:  TYLENOL Take 2 tablets (650 mg total) by mouth every 6 (six) hours as needed for mild pain (or Fever >/= 101).   amiodarone 200 MG tablet Commonly known as:  PACERONE Take 1 tablet (200 mg total) by mouth daily. What changed:  how much to take   aspirin EC 81 MG tablet Take 81 mg by mouth daily.   atorvastatin 20 MG tablet Commonly known as:  LIPITOR Take 20 mg by mouth at bedtime.  calcium acetate 667 MG capsule Commonly known as:  PHOSLO Take 1,334 mg by mouth 3 (three) times daily with meals.   carvedilol 3.125 MG tablet Commonly known as:  COREG Take 1 tablet (3.125 mg total) by mouth 2 (two) times daily. 8 am, 5 pm What changed:    how much to take  additional instructions   collagenase ointment Commonly known as:  SANTYL Apply topically daily.   doxazosin 8 MG  tablet Commonly known as:  CARDURA Take 8 mg by mouth every evening.   furosemide 40 MG tablet Commonly known as:  LASIX Take 40 mg by mouth 2 (two) times daily.   Gerhardt's butt cream Crea Apply 1 application topically 2 (two) times daily.   GUAIFENESIN 1200 PO Take 1,200 mg by mouth at bedtime.   insulin lispro 100 UNIT/ML injection Commonly known as:  HUMALOG Inject 10 Units into the skin 3 (three) times daily before meals.   ipratropium-albuterol 0.5-2.5 (3) MG/3ML Soln Commonly known as:  DUONEB Take 3 mLs by nebulization every 6 (six) hours as needed.   isosorbide mononitrate 30 MG 24 hr tablet Commonly known as:  IMDUR Take 30 mg by mouth daily.   Lidocaine-Prilocaine (Bulk) 2.5-2.5 % Crea Apply 1 application topically as needed. Apply to fistula site prior to dialysis treatments as needed   losartan 50 MG tablet Commonly known as:  COZAAR Take 1 tablet (50 mg total) by mouth at bedtime.   omeprazole 20 MG capsule Commonly known as:  PRILOSEC Take 20 mg by mouth 2 (two) times daily before a meal.   OXYGEN Inhale 6 L into the lungs continuous.   spironolactone 25 MG tablet Commonly known as:  ALDACTONE Take 25 mg by mouth 2 (two) times daily.       If you experience worsening of your admission symptoms, develop shortness of breath, life threatening emergency, suicidal or homicidal thoughts you must seek medical attention immediately by calling 911 or calling your MD immediately  if symptoms less severe.  You Must read complete instructions/literature along with all the possible adverse reactions/side effects for all the Medicines you take and that have been prescribed to you. Take any new Medicines after you have completely understood and accept all the possible adverse reactions/side effects.   Please note  You were cared for by a hospitalist during your hospital stay. If you have any questions about your discharge medications or the care you received  while you were in the hospital after you are discharged, you can call the unit and asked to speak with the hospitalist on call if the hospitalist that took care of you is not available. Once you are discharged, your primary care physician will handle any further medical issues. Please note that NO REFILLS for any discharge medications will be authorized once you are discharged, as it is imperative that you return to your primary care physician (or establish a relationship with a primary care physician if you do not have one) for your aftercare needs so that they can reassess your need for medications and monitor your lab values. Today   SUBJECTIVE   Doing well  VITAL SIGNS:  Blood pressure (!) 143/46, pulse 79, temperature 98.7 F (37.1 C), temperature source Oral, resp. rate 18, height 5\' 8"  (1.727 m), weight 97.1 kg (214 lb 1.1 oz), SpO2 97 %.  I/O:    Intake/Output Summary (Last 24 hours) at 03/16/2017 1445 Last data filed at 03/16/2017 1350 Gross per 24 hour  Intake 240  ml  Output 2436 ml  Net -2196 ml    PHYSICAL EXAMINATION:  GENERAL:  76 y.o.-year-old patient lying in the bed with no acute distress.  EYES: Pupils equal, round, reactive to light and accommodation. No scleral icterus. Extraocular muscles intact.  HEENT: Head atraumatic, normocephalic. Oropharynx and nasopharynx clear.  NECK:  Supple, no jugular venous distention. No thyroid enlargement, no tenderness.  LUNGS: decreased breath sounds bilaterally right >left no wheezing, rales,rhonchi or crepitation. No use of accessory muscles of respiration.  CARDIOVASCULAR: S1, S2 normal. No murmurs, rubs, or gallops.  ABDOMEN: Soft, non-tender, non-distended. Bowel sounds present. No organomegaly or mass.  EXTREMITIES: No pedal edema, cyanosis, or clubbing.  NEUROLOGIC: Cranial nerves II through XII are intact. Muscle strength 5/5 in all extremities. Sensation intact. Gait not checked.  PSYCHIATRIC: The patient is alert and  oriented x 3.  SKIN: No obvious rash, lesion, or ulcer.   DATA REVIEW:   CBC  Recent Labs  Lab 03/16/17 0211  WBC 6.3  HGB 8.3*  HCT 25.4*  PLT 139*    Chemistries  Recent Labs  Lab 03/16/17 0211  NA 140  K 3.7  CL 99*  CO2 31  GLUCOSE 231*  BUN 36*  CREATININE 3.78*  CALCIUM 8.7*    Microbiology Results   Recent Results (from the past 240 hour(s))  MRSA PCR Screening     Status: None   Collection Time: 03/15/17  8:48 PM  Result Value Ref Range Status   MRSA by PCR NEGATIVE NEGATIVE Final    Comment:        The GeneXpert MRSA Assay (FDA approved for NASAL specimens only), is one component of a comprehensive MRSA colonization surveillance program. It is not intended to diagnose MRSA infection nor to guide or monitor treatment for MRSA infections.     RADIOLOGY:  Dg Chest 2 View  Result Date: 03/15/2017 CLINICAL DATA:  Increase shortness of Breath EXAM: CHEST  2 VIEW COMPARISON:  03/14/2017 FINDINGS: Cardiac shadow is enlarged. Aortic calcifications are again seen. Increase in vascular congestion and interstitial edema is noted. There is also increased density over the right lung base consistent with enlarging effusion as well as focal right lower lobe infiltrate. IMPRESSION: Increasing right basilar infiltrate and effusion. Increase in the degree of vascular congestion. Electronically Signed   By: Inez Catalina M.D.   On: 03/15/2017 11:59     Management plans discussed with the patient, family and they are in agreement.  CODE STATUS:     Code Status Orders  (From admission, onward)        Start     Ordered   03/15/17 1457  Do not attempt resuscitation (DNR)  Continuous    Question Answer Comment  In the event of cardiac or respiratory ARREST Do not call a "code blue"   In the event of cardiac or respiratory ARREST Do not perform Intubation, CPR, defibrillation or ACLS   In the event of cardiac or respiratory ARREST Use medication by any route,  position, wound care, and other measures to relive pain and suffering. May use oxygen, suction and manual treatment of airway obstruction as needed for comfort.   Comments nurse may pronounce      03/15/17 1456    Code Status History    Date Active Date Inactive Code Status Order ID Comments User Context   01/27/2017 18:43 01/28/2017 22:02 DNR [id number]  Loletha Grayer, MD ED   01/13/2017 09:29 01/16/2017 16:48 DNR [id number]  Hillary Bow,  MD Inpatient   12/30/2016 19:57 01/13/2017 09:29 Full Code [id number]  Bettey Costa, MD Inpatient   11/25/2016 05:59 11/27/2016 20:08 Full Code [id number]  Harrie Foreman, MD Inpatient   10/31/2016 01:48 10/31/2016 19:29 Full Code [id number]  Saundra Shelling, MD Inpatient   10/16/2016 23:41 10/21/2016 00:21 Full Code [id number]  Vaughan Basta, MD ED   09/14/2016 18:31 09/24/2016 02:43 Full Code [id number]  Nicholes Mango, MD Inpatient   08/26/2016 06:45 08/28/2016 20:11 Full Code [id number]  Saundra Shelling, MD Inpatient   08/20/2016 03:05 08/22/2016 13:01 Full Code [id number]  Lance Coon, MD ED    Advance Directive Documentation     Most Recent Value  Type of Advance Directive  Healthcare Power of Attorney, Living will  Pre-existing out of facility DNR order (yellow form or pink MOST form)  No data  "MOST" Form in Place?  No data      TOTAL TIME TAKING CARE OF THIS PATIENT: *30* minutes.    Fritzi Mandes M.D on 03/16/2017 at 2:45 PM  Between 7am to 6pm - Pager - [phone] After 6pm go to [email or web address] - password [email or web address] [facility]  Office  [phone]  CC: Primary care physician; [name], MD

## 2017-03-16 NOTE — Discharge Instructions (Signed)
Resume your HD MWF

## 2017-03-16 NOTE — Progress Notes (Signed)
[place] at [place] NAME: Calvin Good    MR#:  [id number]  DATE OF BIRTH:  [birth year]  SUBJECTIVE:   Patient came in with increasing shortness of breath and was found to be in respiratory distress secondary to missing hemodialysis since he was busy getting thoracentesis done as outpatient. Daughter urgent hemodialysis yesterday with ultrafiltration of more than 2 L.  He feels a lot better. REVIEW OF SYSTEMS:   Review of Systems  Constitutional: Negative for chills, fever and weight loss.  HENT: Negative for ear discharge, ear pain and nosebleeds.   Eyes: Negative for blurred vision, pain and discharge.  Respiratory: Positive for shortness of breath. Negative for sputum production, wheezing and stridor.   Cardiovascular: Negative for chest pain, palpitations, orthopnea and PND.  Gastrointestinal: Negative for abdominal pain, diarrhea, nausea and vomiting.  Genitourinary: Negative for frequency and urgency.  Musculoskeletal: Negative for back pain and joint pain.  Neurological: Positive for weakness. Negative for sensory change, speech change and focal weakness.  Psychiatric/Behavioral: Negative for depression and hallucinations. The patient is not nervous/anxious.    Tolerating Diet:yes Tolerating PT: pending  DRUG ALLERGIES:   Allergies  Allergen Reactions  . Tetracyclines & Related Other (See Comments)    Reaction: Unknown     VITALS:  Blood pressure (!) 151/55, pulse 70, temperature (!) 97.5 F (36.4 C), temperature source Oral, resp. rate 18, height 5\' 8"  (1.727 m), weight 97.1 kg (214 lb 1.1 oz), SpO2 98 %.  PHYSICAL EXAMINATION:   Physical Exam  GENERAL:  76 y.o.-year-old patient lying in the bed with no acute distress.  EYES: Pupils equal, round, reactive to light and accommodation. No scleral icterus. Extraocular muscles intact.  HEENT: Head atraumatic, normocephalic. Oropharynx and nasopharynx clear.  NECK:   Supple, no jugular venous distention. No thyroid enlargement, no tenderness.  LUNGS: Normal breath sounds bilaterally, no wheezing, rales, rhonchi. No use of accessory muscles of respiration.  CARDIOVASCULAR: S1, S2 normal. No murmurs, rubs, or gallops.  ABDOMEN: Soft, nontender, nondistended. Bowel sounds present. No organomegaly or mass.  EXTREMITIES: No cyanosis, clubbing or edema b/l.    NEUROLOGIC: Cranial nerves II through XII are intact. No focal Motor or sensory deficits b/l.   PSYCHIATRIC:  patient is alert and oriented x 3.  SKIN: No obvious rash, lesion, or ulcer.   LABORATORY PANEL:  CBC Recent Labs  Lab 03/16/17 0211  WBC 6.3  HGB 8.3*  HCT 25.4*  PLT 139*    Chemistries  Recent Labs  Lab 03/16/17 0211  NA 140  K 3.7  CL 99*  CO2 31  GLUCOSE 231*  BUN 36*  CREATININE 3.78*  CALCIUM 8.7*   Cardiac Enzymes Recent Labs  Lab 03/16/17 0820  TROPONINI 0.03*   RADIOLOGY:  Dg Chest 1 View  Result Date: 03/14/2017 CLINICAL DATA:  Status post thoracentesis on the right . EXAM: CHEST 1 VIEW COMPARISON:  Ultrasound 03/14/2017.  Chest x-ray 03/14/2017. FINDINGS: Cardiomegaly with pulmonary vascular prominence and bilateral interstitial prominence with bilateral pleural effusions. Findings suggest CHF. Interim decrease in size of right pleural effusion. No evidence of pneumothorax post thoracentesis. IMPRESSION: 1. Interim decrease in right-sided pleural effusion following thoracentesis. No pneumothorax. 2. Congestive heart failure bilateral from interstitial edema and bilateral pleural effusions. Electronically Signed   By: Marcello Moores  Register   On: 03/14/2017 13:47   Dg Chest 2 View  Result Date: 03/15/2017 CLINICAL DATA:  Increase shortness of Breath EXAM: CHEST  2 VIEW  COMPARISON:  03/14/2017 FINDINGS: Cardiac shadow is enlarged. Aortic calcifications are again seen. Increase in vascular congestion and interstitial edema is noted. There is also increased density over  the right lung base consistent with enlarging effusion as well as focal right lower lobe infiltrate. IMPRESSION: Increasing right basilar infiltrate and effusion. Increase in the degree of vascular congestion. Electronically Signed   By: Inez Catalina M.D.   On: 03/15/2017 11:59   Dg Chest Portable 1 View  Result Date: 03/14/2017 CLINICAL DATA:  Dyspnea, worsening EXAM: PORTABLE CHEST 1 VIEW COMPARISON:  02/14/2017 chest radiograph. FINDINGS: Stable cardiomediastinal silhouette with mild cardiomegaly and aortic atherosclerosis. No pneumothorax. Small right pleural effusion. No left pleural effusion. New patchy right lung base consolidation. No overt pulmonary edema. IMPRESSION: 1. New patchy right lung base consolidation, worrisome for aspiration or pneumonia. Follow-up chest radiographs recommended to resolution. 2. Stable cardiomegaly without overt pulmonary edema. 3. Small right pleural effusion. Electronically Signed   By: Ilona Sorrel M.D.   On: 03/14/2017 12:34   US Thoracentesis Asp Pleural Space W/img Guide  Result Date: 03/14/2017 CLINICAL DATA:  Recurrent right pleural effusion with dyspnea hypoxia. EXAM: ULTRASOUND GUIDED RIGHT THORACENTESIS COMPARISON:  Chest x-ray earlier today. PROCEDURE: An ultrasound guided thoracentesis was thoroughly discussed with the patient and questions answered. The benefits, risks, alternatives and complications were also discussed. The patient understands and wishes to proceed with the procedure. Written consent was obtained. Ultrasound was performed to localize and mark an adequate pocket of fluid in the right chest. The area was then prepped and draped in the normal sterile fashion. 1% Lidocaine was used for local anesthesia. Under ultrasound guidance a 6 French Safe-T-Centesis catheter was introduced. Thoracentesis was performed. The catheter was removed and a dressing applied. COMPLICATIONS: None FINDINGS: A total of approximately 800 mL of clear, amber fluid  was removed. IMPRESSION: Successful ultrasound guided right thoracentesis yielding 800 mL of pleural fluid. Electronically Signed   By: Aletta Edouard M.D.   On: 03/14/2017 14:46   ASSESSMENT AND PLAN:   Calvin Good  is a 76 y.o. 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  1. Acute pulmonary edema-secondary to fluid overload, missed dialysis x1. -Chest x-ray with return of the pleural effusion and pulmonary edema. -800 cc right pleural fluid removed by thoracentesis on 03/14/2017. -HD y'day with UF of >2 liters. Feels better -Nephrology consulted. -Continue 6 L chronic oxygen at this time. -do not see s/s of pneumonia d/c on Rocephin    2. End-stage renal disease on hemodialysis-nephrology consulted.  -Reevaluate for dialysis today -Makes urine at baseline, however has been oliguric with dysuria over the last couple of weeks.   3. Hypertensive urgency-continue home medications. - On Coreg, Cardura, Lasix and Imdur. Losartan started   4. Atrial fibrillation-has paroxysmal A. fib. Currently in normal sinus rhythm.  -Continue amiodarone and Coreg for now. Not on chronic anticoagulation due to history of GI bleed. Continue aspirin for now.  5. DVT prophylaxis-on subcutaneous heparin  Physical therapy consult.  Case discussed with Care Management/Social Worker. Management plans discussed with the patient, family and they are in agreement.  CODE STATUS: DNR  DVT Prophylaxis: heparin  TOTAL TIME TAKING CARE OF THIS PATIENT: *30* minutes.  >50% time spent on counselling and coordination of care  POSSIBLE D/C IN  1-2 DAYS, DEPENDING ON CLINICAL CONDITION.  Note: This dictation was prepared with Dragon dictation along with smaller phrase  technology. Any transcriptional errors that result from this process are unintentional.  Calvin Good M.D on 03/16/2017 at 11:32 AM  Between 7am to 6pm - Pager - [phone]  After 6pm go to [email or web address] - password [email or web address] [facility]  Office  [phone]  CC: Primary care physician; [name], MDPatient ID: Calvin Good, male   DOB: December 07, 1940, 76 y.o.   MRN: [id number]

## 2017-03-16 NOTE — Care Management (Signed)
Informed that patient is discharging.  Obtained order to resume PT SN OT and sent notification to [place]. Requested palliative resumption and sent notification to resume those services in the home.

## 2017-03-16 NOTE — Progress Notes (Signed)
[facility]  ROUNDING NOTE   Subjective:  Patient well-known to [place]. 2 days ago he had a thoracentesis performed. He is also had recent talc pleurodesis. He came back in with shortness of breath. We had set up dialysis for the patient on Saturday but prior to making it he became short of breath as above. Patient did undergo dialysis yesterday and has improved.   Objective:  Vital signs in last 24 hours:  Temp:  [97.5 F (36.4 C)-98 F (36.7 C)] 97.5 F (36.4 C) (12/16 0413) Pulse Rate:  [65-87] 70 (12/16 0413) Resp:  [15-28] 18 (12/16 0413) BP: (151-194)/(54-173) 151/55 (12/16 0413) SpO2:  [94 %-100 %] 98 % (12/16 0413) Weight:  [97.1 kg (214 lb)-97.1 kg (214 lb 1.1 oz)] 97.1 kg (214 lb 1.1 oz) (12/15 1505)  Weight change:  Filed Weights   03/15/17 1120 03/15/17 1505  Weight: 97.1 kg (214 lb) 97.1 kg (214 lb 1.1 oz)    Intake/Output: I/O last 3 completed shifts: In: -  Out: 2436 [Other:2436]   Intake/Output this shift:  No intake/output data recorded.  Physical Exam: General: No acute distress  Head: Normocephalic, atraumatic. Moist oral mucosal membranes  Eyes: Anicteric  Neck: Supple, trachea midline  Lungs:   Diminished at bases, normal effort  Heart: S1S2 irregular  Abdomen:  Soft, nontender, bowel sounds present  Extremities: 1+ peripheral edema.  Neurologic: Awake, alert, following commands  Skin: No lesions  Access: Left upper arm AV fistula    Basic Metabolic Panel: Recent Labs  Lab 03/14/17 1140 03/15/17 1124 03/16/17 0211  NA 138 136 140  K 3.8 4.1 3.7  CL 99* 99* 99*  CO2 28 25 31   GLUCOSE 220* 215* 231*  BUN 47* 62* 36*  CREATININE 5.01* 6.08* 3.78*  CALCIUM 9.0 9.1 8.7*    Liver Function Tests: No results for input(s): AST, ALT, ALKPHOS, BILITOT, PROT, ALBUMIN in the last 168 hours. No results for input(s): LIPASE, AMYLASE in the last 168 hours. No results for input(s): AMMONIA in the last 168 hours.  CBC: Recent Labs   Lab 03/14/17 1140 03/15/17 1124 03/16/17 0211  WBC 6.2 7.4 6.3  NEUTROABS 4.5  --   --   HGB 8.1* 8.1* 8.3*  HCT 25.4* 25.5* 25.4*  MCV 84.5 84.4 83.0  PLT 139* 140* 139*    Cardiac Enzymes: Recent Labs  Lab 03/15/17 1124 03/15/17 1957 03/16/17 0211  TROPONINI 0.03* 0.03* 0.03*    BNP: Invalid input(s): POCBNP  CBG: Recent Labs  Lab 03/15/17 2219 03/16/17 0740  GLUCAP 73* 184*    Microbiology: Results for orders placed or performed during the hospital encounter of 03/15/17  MRSA PCR Screening     Status: None   Collection Time: 03/15/17  8:48 PM  Result Value Ref Range Status   MRSA by PCR NEGATIVE NEGATIVE Final    Comment:        The GeneXpert MRSA Assay (FDA approved for NASAL specimens only), is one component of a comprehensive MRSA colonization surveillance program. It is not intended to diagnose MRSA infection nor to guide or monitor treatment for MRSA infections.     Coagulation Studies: No results for input(s): LABPROT, INR in the last 72 hours.  Urinalysis: No results for input(s): COLORURINE, LABSPEC, PHURINE, GLUCOSEU, HGBUR, BILIRUBINUR, KETONESUR, PROTEINUR, UROBILINOGEN, NITRITE, LEUKOCYTESUR in the last 72 hours.  Invalid input(s): APPERANCEUR    Imaging: Dg Chest 1 View  Result Date: 03/14/2017 CLINICAL DATA:  Status post thoracentesis on the right .  EXAM: CHEST 1 VIEW COMPARISON:  Ultrasound 03/14/2017.  Chest x-ray 03/14/2017. FINDINGS: Cardiomegaly with pulmonary vascular prominence and bilateral interstitial prominence with bilateral pleural effusions. Findings suggest CHF. Interim decrease in size of right pleural effusion. No evidence of pneumothorax post thoracentesis. IMPRESSION: 1. Interim decrease in right-sided pleural effusion following thoracentesis. No pneumothorax. 2. Congestive heart failure bilateral from interstitial edema and bilateral pleural effusions. Electronically Signed   By: Marcello Moores  Register   On: 03/14/2017  13:47   Dg Chest 2 View  Result Date: 03/15/2017 CLINICAL DATA:  Increase shortness of Breath EXAM: CHEST  2 VIEW COMPARISON:  03/14/2017 FINDINGS: Cardiac shadow is enlarged. Aortic calcifications are again seen. Increase in vascular congestion and interstitial edema is noted. There is also increased density over the right lung base consistent with enlarging effusion as well as focal right lower lobe infiltrate. IMPRESSION: Increasing right basilar infiltrate and effusion. Increase in the degree of vascular congestion. Electronically Signed   By: Inez Catalina M.D.   On: 03/15/2017 11:59   Dg Chest Portable 1 View  Result Date: 03/14/2017 CLINICAL DATA:  Dyspnea, worsening EXAM: PORTABLE CHEST 1 VIEW COMPARISON:  02/14/2017 chest radiograph. FINDINGS: Stable cardiomediastinal silhouette with mild cardiomegaly and aortic atherosclerosis. No pneumothorax. Small right pleural effusion. No left pleural effusion. New patchy right lung base consolidation. No overt pulmonary edema. IMPRESSION: 1. New patchy right lung base consolidation, worrisome for aspiration or pneumonia. Follow-up chest radiographs recommended to resolution. 2. Stable cardiomegaly without overt pulmonary edema. 3. Small right pleural effusion. Electronically Signed   By: Ilona Sorrel M.D.   On: 03/14/2017 12:34   US Thoracentesis Asp Pleural Space W/img Guide  Result Date: 03/14/2017 CLINICAL DATA:  Recurrent right pleural effusion with dyspnea hypoxia. EXAM: ULTRASOUND GUIDED RIGHT THORACENTESIS COMPARISON:  Chest x-ray earlier today. PROCEDURE: An ultrasound guided thoracentesis was thoroughly discussed with the patient and questions answered. The benefits, risks, alternatives and complications were also discussed. The patient understands and wishes to proceed with the procedure. Written consent was obtained. Ultrasound was performed to localize and mark an adequate pocket of fluid in the right chest. The area was then prepped and  draped in the normal sterile fashion. 1% Lidocaine was used for local anesthesia. Under ultrasound guidance a 6 French Safe-T-Centesis catheter was introduced. Thoracentesis was performed. The catheter was removed and a dressing applied. COMPLICATIONS: None FINDINGS: A total of approximately 800 mL of clear, amber fluid was removed. IMPRESSION: Successful ultrasound guided right thoracentesis yielding 800 mL of pleural fluid. Electronically Signed   By: Aletta Edouard M.D.   On: 03/14/2017 14:46     Medications:   . cefTRIAXone (ROCEPHIN)  IV     . amiodarone  100 mg Oral Daily  . aspirin EC  81 mg Oral Daily  . atorvastatin  20 mg Oral QHS  . calcium acetate  1,334 mg Oral TID WC  . carvedilol  6.25 mg Oral BID WC  . collagenase   Topical Daily  . doxazosin  8 mg Oral QPM  . furosemide  40 mg Oral BID  . heparin  5,000 Units Subcutaneous Q8H  . insulin aspart  0-5 Units Subcutaneous QHS  . insulin aspart  0-9 Units Subcutaneous TID WC  . isosorbide mononitrate  30 mg Oral Daily  . losartan  50 mg Oral QHS  . pantoprazole  40 mg Oral BID  . spironolactone  25 mg Oral BID   acetaminophen, hydrALAZINE, ipratropium-albuterol, lidocaine-prilocaine, ondansetron **OR** ondansetron (ZOFRAN) IV  Assessment/  Plan:  76 y.o. male with end stage renal disease on hemodialysis, diabetes mellitus type II insulin dependent, hypertension, coronary artery disease, hyperlipidemia, prostate cancer status post prostatectomy, COPD  MWF [facility]  1.  ESRD on HD MWF.  Patient did not receive dialysis on Friday but did have dialysis performed yesterday.  No acute indication for dialysis today.  He will have dialysis tomorrow as an outpatient.  2.  Anemia of chronic kidney disease.  Hemoglobin 8.3.  He will continue on erythropoietin stimulating agents as an outpatient.  3.  Secondary hyperparathyroidism.  Maintain the patient on calcium acetate 2 tablets p.o. 3 times daily with  meals.  4.  Hypertension.  Continue losartan, isosorbide, spironolactone, and carvedilol.      LOS: 1 Yitta Gongaware 12/16/201810:25 AM

## 2017-03-17 LAB — HEMOGLOBIN A1C
Hgb A1c MFr Bld: 7.8 % — ABNORMAL HIGH (ref 4.8–5.6)
Mean Plasma Glucose: 177 mg/dL

## 2017-03-18 ENCOUNTER — Ambulatory Visit: Payer: Medicare Other

## 2017-03-21 ENCOUNTER — Emergency Department: Payer: Medicare Other

## 2017-03-21 ENCOUNTER — Encounter: Payer: Self-pay | Admitting: Emergency Medicine

## 2017-03-21 ENCOUNTER — Emergency Department
Admission: EM | Admit: 2017-03-21 | Discharge: 2017-03-21 | Disposition: A | Discharge status: Home / Self Care | Payer: Medicare Other | Attending: Emergency Medicine | Admitting: Emergency Medicine

## 2017-03-21 ENCOUNTER — Other Ambulatory Visit: Payer: Self-pay

## 2017-03-21 DIAGNOSIS — Z87891 Personal history of nicotine dependence: Secondary | ICD-10-CM | POA: Insufficient documentation

## 2017-03-21 DIAGNOSIS — I482 Chronic atrial fibrillation, unspecified: Secondary | ICD-10-CM

## 2017-03-21 DIAGNOSIS — I132 Hypertensive heart and chronic kidney disease with heart failure and with stage 5 chronic kidney disease, or end stage renal disease: Secondary | ICD-10-CM | POA: Insufficient documentation

## 2017-03-21 DIAGNOSIS — J811 Chronic pulmonary edema: Secondary | ICD-10-CM | POA: Diagnosis not present

## 2017-03-21 DIAGNOSIS — Z992 Dependence on renal dialysis: Secondary | ICD-10-CM | POA: Insufficient documentation

## 2017-03-21 DIAGNOSIS — I252 Old myocardial infarction: Secondary | ICD-10-CM | POA: Diagnosis not present

## 2017-03-21 DIAGNOSIS — N186 End stage renal disease: Secondary | ICD-10-CM | POA: Diagnosis not present

## 2017-03-21 DIAGNOSIS — Z79899 Other long term (current) drug therapy: Secondary | ICD-10-CM | POA: Insufficient documentation

## 2017-03-21 DIAGNOSIS — E119 Type 2 diabetes mellitus without complications: Secondary | ICD-10-CM | POA: Insufficient documentation

## 2017-03-21 DIAGNOSIS — I5032 Chronic diastolic (congestive) heart failure: Secondary | ICD-10-CM | POA: Insufficient documentation

## 2017-03-21 DIAGNOSIS — Z7982 Long term (current) use of aspirin: Secondary | ICD-10-CM | POA: Diagnosis not present

## 2017-03-21 DIAGNOSIS — J449 Chronic obstructive pulmonary disease, unspecified: Secondary | ICD-10-CM | POA: Diagnosis not present

## 2017-03-21 DIAGNOSIS — R531 Weakness: Secondary | ICD-10-CM | POA: Diagnosis present

## 2017-03-21 LAB — BASIC METABOLIC PANEL
ANION GAP: 11 (ref 5–15)
BUN: 19 mg/dL (ref 6–20)
CALCIUM: 8.5 mg/dL — AB (ref 8.9–10.3)
CO2: 31 mmol/L (ref 22–32)
Chloride: 95 mmol/L — ABNORMAL LOW (ref 101–111)
Creatinine, Ser: 2.17 mg/dL — ABNORMAL HIGH (ref 0.61–1.24)
GFR, EST AFRICAN AMERICAN: 32 mL/min — AB (ref >60)
GFR, EST NON AFRICAN AMERICAN: 27 mL/min — AB (ref >60)
Glucose, Bld: 229 mg/dL — ABNORMAL HIGH (ref 65–99)
POTASSIUM: 3.2 mmol/L — AB (ref 3.5–5.1)
Sodium: 137 mmol/L (ref 135–145)

## 2017-03-21 LAB — CBC
HCT: 27.8 % — ABNORMAL LOW (ref 40.0–52.0)
Hemoglobin: 9.1 g/dL — ABNORMAL LOW (ref 13.0–18.0)
MCH: 27.7 pg (ref 26.0–34.0)
MCHC: 32.7 g/dL (ref 32.0–36.0)
MCV: 84.6 fL (ref 80.0–100.0)
PLATELETS: 141 10*3/uL — AB (ref 150–440)
RBC: 3.29 MIL/uL — ABNORMAL LOW (ref 4.40–5.90)
RDW: 19.3 % — AB (ref 11.5–14.5)
WBC: 9.1 10*3/uL (ref 3.8–10.6)

## 2017-03-21 LAB — BRAIN NATRIURETIC PEPTIDE: B NATRIURETIC PEPTIDE 5: 885 pg/mL — AB (ref 0.0–100.0)

## 2017-03-21 LAB — TROPONIN I: Troponin I: <0.03 ng/mL (ref <0.03)

## 2017-03-21 MED ORDER — METOPROLOL TARTRATE 5 MG/5ML IV SOLN
5.0000 mg | Freq: Once | INTRAVENOUS | Status: AC
Start: 2017-03-21 — End: 2017-03-21
  Administered 2017-03-21: 5 mg via INTRAVENOUS
  Filled 2017-03-21: qty 5

## 2017-03-21 MED ORDER — FUROSEMIDE 10 MG/ML IJ SOLN
40.0000 mg | Freq: Once | INTRAMUSCULAR | Status: AC
  Administered 2017-03-21: 40 mg via INTRAVENOUS
  Filled 2017-03-21: qty 4

## 2017-03-21 MED ORDER — SODIUM CHLORIDE 0.9 % IV BOLUS (SEPSIS)
500.0000 mL | Freq: Once | INTRAVENOUS | Status: AC
  Administered 2017-03-21: 500 mL via INTRAVENOUS

## 2017-03-21 NOTE — Discharge Instructions (Signed)
Go to your dialysis on Sunday as scheduled.  Follow-up with your regular doctor within the next week if possible.  Return to the emergency department immediately if you develop new, worsening, or recurrent shortness of breath, generalized weakness, chest pain, worsening swelling in her legs, fever or chills, cough especially with phlegm, or any other new or worsening symptoms that concern you.  Take your medications as prescribed.

## 2017-03-21 NOTE — ED Notes (Signed)
Pt placed in wheelchair for DC, pt on his 02 from home, saturation 95% on 6L prior to DC. Pt's daughter wheeled him out, pt A&O and in NAD at this time.

## 2017-03-21 NOTE — ED Notes (Signed)
Phlebotomist contacted to come draw blood due to numerous unsuccessful attempts at getting blood by RNs

## 2017-03-21 NOTE — ED Triage Notes (Signed)
Patient from home via [facility] complaining of increased weakness since dialysis treatment this afternoon. Patient reports trying to get up stairs to his house and being unable to so called 911. EMS reports initial oxygen saturation in the 70's on 6L [place] upon their arrival. States once they got patient into truck and warmed up, saturation rose to 94% on 6L. Patient wears 6L at baseline. Patient denies CP or SOB. History of CHF and COPD. Alert and oriented x4.

## 2017-03-21 NOTE — ED Notes (Signed)

## 2017-03-21 NOTE — ED Provider Notes (Signed)
[facility] Emergency Department Provider Note ____________________________________________   First MD Initiated Contact with Patient 03/21/17 1846     (approximate)  I have reviewed the triage vital signs and the nursing notes.   HISTORY  Chief Complaint Weakness    HPI Calvin Good is a 76 y.o. male with past medical history as noted below who presents with generalized weakness, acute onset within the last few hours after he completed dialysis, associated with increased shortness of breath from his baseline.  Patient states that his dialysis proceeded normally, however when he got home he felt like he was unable to get out of bed, and when EMS arrived he was significantly hypoxic.  Patient states that he normally uses 6 L of O2 by nasal cannula, but when EMS arrived on the 6 L his saturation was in the 60s-70s.   Past Medical History:  Diagnosis Date  . Cancer [facility])    Prostate  . CHF (congestive heart failure) ([place])   . Chronic kidney disease   . Complication of anesthesia    hard to wake up  . COPD (chronic obstructive pulmonary disease) ([place])   . Diabetes mellitus without complication ([place])   . Difficult intubation   . Dyspnea   . ESRD (end stage renal disease) ([place])    on Monday-wednesday- Friday hemodialysis  . GERD (gastroesophageal reflux disease)   . Hyperlipidemia   . Hypertension   . Pleural effusion   . Renal insufficiency     Patient Active Problem List   Diagnosis Date Noted  . Pulmonary edema 03/15/2017  . Pressure injury of skin 01/28/2017  . ESRD on dialysis ([place])   . Recurrent pleural effusion on right   . Atrial fibrillation ([place]) 12/30/2016  . Atrial fibrillation with RVR ([place]) 11/25/2016  . A-fib ([place]) 10/30/2016  . Allergic rhinitis due to pollen 10/29/2016  . Chronic airway obstruction ([place]) 10/29/2016  . Chronic fatigue syndrome 10/29/2016  . Dependence on renal dialysis ([place]) 10/29/2016  . Diabetic neuropathy  ([place]) 10/29/2016  . Diabetic renal disease ([place]) 10/29/2016  . Heart failure ([place]) 10/29/2016  . Hyperglycemia due to type 2 diabetes mellitus ([place]) 10/29/2016  . Incontinence without sensory awareness 10/29/2016  . Long term current use of insulin ([place]) 10/29/2016  . Obstructive sleep apnea 10/29/2016  . Urinary incontinence 10/29/2016  . Acute respiratory failure ([place]) 10/16/2016  . Anxiety 10/10/2016  . Acute encephalopathy   . HCAP (healthcare-associated pneumonia) 09/14/2016  . Tobacco use 09/03/2016  . Aneurysm ([place]) 08/28/2016  . Pseudoaneurysm following procedure ([place]) 08/26/2016  . Chest tightness 08/20/2016  . GERD without esophagitis 08/20/2016  . NSTEMI (non-ST elevated myocardial infarction) ([place]) 08/20/2016  . Cancer of prostate ([place]) 05/16/2016  . Chronic diastolic CHF (congestive heart failure) ([place]) 05/16/2016  . Diabetic polyneuropathy associated with type 2 diabetes mellitus ([place]) 05/16/2016  . Onychomycosis of toenail 05/13/2016  . Essential hypertension, benign 04/23/2016  . Hyperlipidemia 04/23/2016  . Type 2 diabetes mellitus with chronic kidney disease on chronic dialysis, with long-term current use of insulin ([place]) 04/23/2016  . ESRD (end stage renal disease) ([place]) 04/23/2016    Past Surgical History:  Procedure Laterality Date  . APPENDECTOMY    . AV FISTULA PLACEMENT Left 05/30/2016   Procedure: ARTERIOVENOUS (AV) FISTULA CREATION ( BRACHIOCEPHALIC );  Surgeon: [name], MD;  Location: [facility] ORS;  Service: Vascular;  Laterality: Left;  . CAPD INSERTION N/A 05/30/2016   Procedure: LAPAROSCOPIC INSERTION CONTINUOUS AMBULATORY PERITONEAL DIALYSIS  (CAPD)  CATHETER;  Surgeon: [name], MD;  Location: [facility] ORS;  Service: Vascular;  Laterality: N/A;  . DIALYSIS/PERMA CATHETER INSERTION    . DIALYSIS/PERMA CATHETER REMOVAL N/A 08/21/2016   Procedure: Dialysis/Perma Catheter Removal;  Surgeon: [name], MD;  Location: [place] [facility];  Service:  Cardiovascular;  Laterality: N/A;  . JOINT REPLACEMENT     right knee   . LEFT HEART CATH AND CORONARY ANGIOGRAPHY N/A 08/21/2016   Procedure: Left Heart Cath and Coronary Angiography possible PCI;  Surgeon: [name], MD;  Location: [place] [facility];  Service: Cardiovascular;  Laterality: N/A;  . postate removal     . PROSTATE SURGERY    . PSEUDOANERYSM COMPRESSION Right 08/27/2016   Procedure: Pseudoanerysm Compression;  Surgeon: Katha Cabal, MD;  Location: [place] [facility];  Service: Cardiovascular;  Laterality: Right;  . REMOVAL OF A DIALYSIS CATHETER N/A 08/08/2016   Procedure: REMOVAL OF A DIALYSIS CATHETER;  Surgeon: [name], MD;  Location: [facility] ORS;  Service: Vascular;  Laterality: N/A;  . VIDEO ASSISTED THORACOSCOPY (VATS)/THOROCOTOMY Right 01/06/2017   Procedure: PREOPERATIVE BRONCHOSCOPY, RIGHT VIDEO ASSISTED THORACOSCOPY (VATS) WITH TALC PLEURODESIS;  Surgeon: Nestor Lewandowsky, MD;  Location: [facility] ORS;  Service: General;  Laterality: Right;    Prior to Admission medications   Medication Sig Start Date End Date Taking? Authorizing Provider  acetaminophen (TYLENOL) 325 MG tablet Take 2 tablets (650 mg total) by mouth every 6 (six) hours as needed for mild pain (or Fever >/= 101). 09/23/16   Nicholes Mango, MD  amiodarone (PACERONE) 200 MG tablet Take 1 tablet (200 mg total) by mouth daily. Patient taking differently: Take 100 mg by mouth daily.  01/15/17   Hillary Bow, MD  aspirin EC 81 MG tablet Take 81 mg by mouth daily.    [provider]  atorvastatin (LIPITOR) 20 MG tablet Take 20 mg by mouth at bedtime.     [provider]  calcium acetate (PHOSLO) 667 MG capsule Take 1,334 mg by mouth 3 (three) times daily with meals.     [provider]  carvedilol (COREG) 3.125 MG tablet Take 1 tablet (3.125 mg total) by mouth 2 (two) times daily. 8 am, 5 pm Patient taking differently: Take 6.25 mg by mouth 2 (two) times daily. 8 am, 5 pm  01/16/17   Hillary Bow, MD  collagenase (SANTYL) ointment Apply topically daily. 01/29/17   Fritzi Mandes, MD  doxazosin (CARDURA) 8 MG tablet Take 8 mg by mouth every evening.     [provider]  furosemide (LASIX) 40 MG tablet Take 40 mg by mouth 2 (two) times daily.     [provider]  GUAIFENESIN 1200 PO Take 1,200 mg by mouth at bedtime.    [provider]  Hydrocortisone (GERHARDT'S BUTT CREAM) CREA Apply 1 application topically 2 (two) times daily. 01/28/17   Fritzi Mandes, MD  insulin lispro (HUMALOG) 100 UNIT/ML injection Inject 10 Units into the skin 3 (three) times daily before meals.    [provider]  ipratropium-albuterol (DUONEB) 0.5-2.5 (3) MG/3ML SOLN Take 3 mLs by nebulization every 6 (six) hours as needed. 09/23/16   Nicholes Mango, MD  isosorbide mononitrate (IMDUR) 30 MG 24 hr tablet Take 30 mg by mouth daily.  11/28/16   [provider]  Lidocaine-Prilocaine, Bulk, 2.5-2.5 % CREA Apply 1 application topically as needed. Apply to fistula site prior to dialysis treatments as needed    [provider]  losartan (  COZAAR) 50 MG tablet Take 1 tablet (50 mg total) by mouth at bedtime. 03/16/17   Fritzi Mandes, MD  omeprazole (PRILOSEC) 20 MG capsule Take 20 mg by mouth 2 (two) times daily before a meal.     [provider]  OXYGEN Inhale 6 L into the lungs continuous.     [provider]  spironolactone (ALDACTONE) 25 MG tablet Take 25 mg by mouth 2 (two) times daily.     [provider]    Allergies Tetracyclines & related  Family History  Problem Relation Age of Onset  . Colon cancer Father   . Brain cancer Mother   . CAD Neg Hx   . Diabetes Neg Hx     Social History Social History   Tobacco Use  . Smoking status: Former Smoker    Types: Cigarettes    Last attempt to quit: 05/21/1982    Years since quitting: 34.8  . Smokeless tobacco: Never Used  Substance Use Topics  . Alcohol use: Yes      Alcohol/week: 1.8 oz    Types: 3 Glasses of wine per week  . Drug use: No    Review of Systems  Constitutional: No fever.  Positive for generalized weakness. Eyes: No visual changes. ENT: No sore throat. Cardiovascular: Denies chest pain. Respiratory: Positive for denies shortness of breath. Gastrointestinal: No nausea, no vomiting.  No diarrhea.  Genitourinary: Negative for dysuria or frequency (patient states he makes approximately 1 cup of urine per day).  Musculoskeletal: Negative for back pain. Skin: Negative for rash. Neurological: Negative for headache.   ____________________________________________   PHYSICAL EXAM:  VITAL SIGNS: ED Triage Vitals  Enc Vitals Group     BP 03/21/17 1833 (!) 155/71     Pulse Rate 03/21/17 1833 (!) 110     Resp 03/21/17 1833 (!) 31     Temp 03/21/17 1833 (!) 97.5 F (36.4 C)     Temp Source 03/21/17 1833 Oral     SpO2 03/21/17 1833 97 %     Weight 03/21/17 1831 209 lb (94.8 kg)     Height 03/21/17 1831 5\' 8"  (1.727 m)     Head Circumference --      Peak Flow --      Pain Score --      Pain Loc --      Pain Edu? --      Excl. in [place]? --     Constitutional: Alert and oriented.  Weak appearing, but in no acute distress. Eyes: Conjunctivae are normal.  Head: Atraumatic. Nose: No congestion/rhinnorhea. Mouth/Throat: Mucous membranes are dry.   Neck: Normal range of motion.  Cardiovascular: Tachycardic, regular rhythm. Grossly normal heart sounds.  Good peripheral circulation. Respiratory: Slightly increased respiratory effort.  No respiratory distress no retractions. Lung with decreased breath sounds and trace rhonchi bilaterally. Gastrointestinal: Soft and nontender. No distention.  Genitourinary: No CVA tenderness. Musculoskeletal: No lower extremity edema.  Extremities warm and well perfused.  Neurologic:  Normal speech and language. No gross focal neurologic deficits are appreciated.  Motor and sensory intact in all  extremities.  Normal coordination. Skin:  Skin is warm and dry. No rash noted. Psychiatric: Mood and affect are normal. Speech and behavior are normal.  ____________________________________________   LABS (all labs ordered are listed, but only abnormal results are displayed)  Labs Reviewed  CBC - Abnormal; Notable for the following components:      Result Value   RBC 3.29 (*)  Hemoglobin 9.1 (*)    HCT 27.8 (*)    RDW 19.3 (*)    Platelets 141 (*)    All other components within normal limits  BRAIN NATRIURETIC PEPTIDE - Abnormal; Notable for the following components:   B Natriuretic Peptide 885.0 (*)    All other components within normal limits  BLOOD GAS, VENOUS - Abnormal; Notable for the following components:   Bicarbonate 38.3 (*)    Acid-Base Excess 12.2 (*)    All other components within normal limits  BASIC METABOLIC PANEL - Abnormal; Notable for the following components:   Potassium 3.2 (*)    Chloride 95 (*)    Glucose, Bld 229 (*)    Creatinine, Ser 2.17 (*)    Calcium 8.5 (*)    GFR calc non Af Amer 27 (*)    GFR calc Af Amer 32 (*)    All other components within normal limits  TROPONIN I  URINALYSIS, COMPLETE (UACMP) WITH MICROSCOPIC  CBG MONITORING, ED   ____________________________________________  EKG  ED ECG REPORT I, Arta Silence, the attending physician, personally viewed and interpreted this ECG.  Date: 03/21/2017 EKG Time: 1833 Rate: 121 Rhythm: Atrial fibrillation with RVR QRS Axis: normal Intervals: RBBB, LPFB ST/T Wave abnormalities: Nonspecific ST depression diffusely Narrative Interpretation: no evidence of acute ischemia; other than rate, no significant change when compared to EKG of 03/18/2017  ____________________________________________  RADIOLOGY  CXR: New megaly with vascular congestion and pulmonary edema; cannot rule out infiltrate  ____________________________________________   PROCEDURES  Procedure(s)  performed: No    Critical Care performed: No ____________________________________________   INITIAL IMPRESSION / ASSESSMENT AND PLAN / ED COURSE  Pertinent labs & imaging results that were available during my care of the patient were reviewed by me and considered in my medical decision making (see chart for details).  76 year old male with past medical history as noted above presents with generalized weakness, increased shortness of breath, and resolved hypoxia after dialysis this afternoon which he states proceeded normally.  Past medical records reviewed in Epic; patient was admitted for acute pulmonary edema last week and discharged 5 days ago.  Patient's last cardiac echo in August of this year showed EF of 55%.  On exam, patient is tachycardic, mildly hypertensive, and with increased respiratory rate.  He is afebrile.  He has no acute respiratory distress and the remainder of the exam is as described above.  Differential includes COPD exacerbation, dehydration or hypovolemia related to the dialysis, especially given the patient is in rapid atrial fibrillation appears somewhat clinically dehydrated, pneumonia or acute bronchitis, or less likely fluid overload, or acute CHF or ACS.  Given his tachycardia and clinical dehydration, I will give a small fluid bolus, obtain labs, chest x-ray, and reassess.    ----------------------------------------- 9:28 PM on 03/21/2017 -----------------------------------------  Patient's workup revealed clinical picture more consistent with mild fluid overload rather than COPD exacerbation or hypovolemia as I had initially suspected.  Patient had no significant change in his symptoms after the small fluid bolus, however he also had no improvement and his heart rate remained in the 110s-130s.  I then gave a dose of IV metoprolol, and since patient still makes urine a dose of Lasix to improve the pulmonary edema seen on chest x-ray.  Patient's other labs  are actually mostly improved from recent results, and his VBG shows no concerning findings.  After the metoprolol, patient states that he feels much better.  He was able to ambulate to the commode  with the nurse and had no difficulty doing so, and his O2 sat is in the mid to high 90s on his normal 6 L.  I offered to admit the patient to further observe him and potentially do further diuresis, however patient is adamant that he would like to go home if at all possible.  He states that he feels much better and would not like to stay in the hospital unless it is completely necessary.  Given the patient has no respiratory distress, improved symptoms, and now normal vital signs, there is no indication to keep him in the hospital if he prefers not to stay.    Patient has no fever, elevated white blood cell count, or significant cough to suggest pneumonia; although chest x-ray read cannot completely rule out infiltrate, there is no clinical evidence for pneumonia so no indication for treatment at this time.  I discussed discharge instructions and return precautions with the patient and his family members, and they expressed understanding.  Patient is also getting his dialysis a day early due to Christmas.  They agree to return if he has any new, worsening, recurrent symptoms. ____________________________________________   FINAL CLINICAL IMPRESSION(S) / ED DIAGNOSES  Final diagnoses:  End stage renal disease ([place])  Chronic pulmonary edema  Chronic atrial fibrillation ([facility])      NEW MEDICATIONS STARTED DURING THIS VISIT:  This SmartLink is deprecated. Use AVSMEDLIST instead to display the medication list for a patient.   Note:  This document was prepared using Dragon voice recognition software and may include unintentional dictation errors.    Arta Silence, MD 03/21/17 2133

## 2017-03-21 NOTE — ED Notes (Signed)
Pt ambulated to BR without difficulty, pt states he feels better and states "I want to get home." Pt in NAD at this time, EDP aware.

## 2017-03-21 NOTE — ED Notes (Addendum)
Patient given sandwich tray and diet sprite per MD Siadecki ok to have.

## 2017-03-27 ENCOUNTER — Telehealth: Payer: Self-pay | Admitting: Internal Medicine

## 2017-03-27 ENCOUNTER — Ambulatory Visit: Payer: Medicare Other | Admitting: Podiatry

## 2017-03-27 NOTE — Telephone Encounter (Signed)
Spoke with Calvin Good and then daughter Calvin Good about f/u appt Pt sees Dr Raul Del at [facility] recall

## 2017-03-28 LAB — BLOOD GAS, VENOUS
ACID-BASE EXCESS: 12.2 mmol/L — AB (ref 0.0–2.0)
Bicarbonate: 38.3 mmol/L — ABNORMAL HIGH (ref 20.0–28.0)
PCO2 VEN: 59 mmHg (ref 44.0–60.0)
PH VEN: 7.42 (ref 7.250–7.430)
Patient temperature: 37

## 2017-04-02 ENCOUNTER — Other Ambulatory Visit: Payer: Self-pay | Admitting: Nephrology

## 2017-04-02 DIAGNOSIS — J90 Pleural effusion, not elsewhere classified: Secondary | ICD-10-CM

## 2017-04-03 ENCOUNTER — Ambulatory Visit
Admission: RE | Admit: 2017-04-03 | Discharge: 2017-04-03 | Disposition: A | Discharge status: Home / Self Care | Payer: Medicare Other | Source: Ambulatory Visit | Attending: Interventional Radiology | Admitting: Interventional Radiology

## 2017-04-03 ENCOUNTER — Ambulatory Visit
Admission: RE | Admit: 2017-04-03 | Discharge: 2017-04-03 | Disposition: A | Discharge status: Home / Self Care | Payer: Medicare Other | Source: Ambulatory Visit | Attending: Nephrology | Admitting: Nephrology

## 2017-04-03 DIAGNOSIS — Z9889 Other specified postprocedural states: Secondary | ICD-10-CM

## 2017-04-03 DIAGNOSIS — J90 Pleural effusion, not elsewhere classified: Secondary | ICD-10-CM | POA: Insufficient documentation

## 2017-05-02 DEATH — deceased

## 2017-12-14 IMAGING — CT CT CHEST W/O CM
2 of 3 series · 15 of 36 positions shown, 18 images · non-contrast
Comparison: Chest radiograph 09/14/2016

CLINICAL DATA: Increasing dyspnea.

EXAM:
CT CHEST WITHOUT CONTRAST
TECHNIQUE: Multidetector CT imaging of the chest was performed following the
standard protocol without IV contrast.

[Series 2: thorax · axial · 0.80mm/px · z∈[-152,+136]mm · 12 of 170 slices shown, 15 images]
[im 13/170  mediastinal]
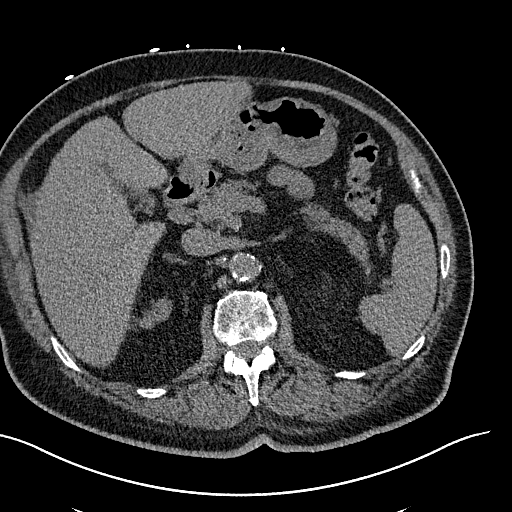
[im 13/170  lung]
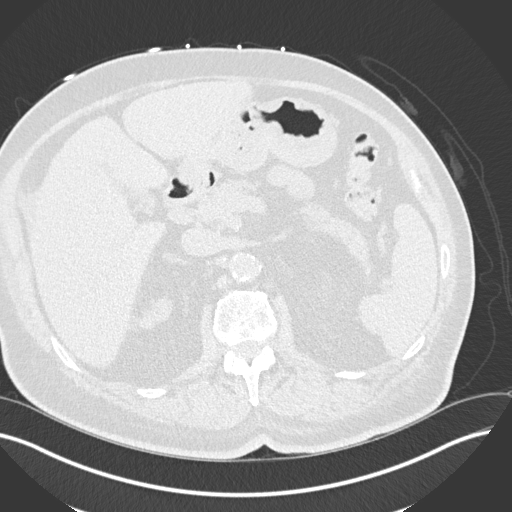
[im 26/170  lung]
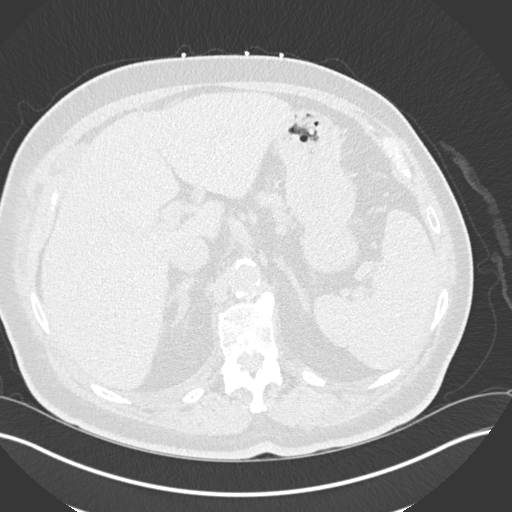
[im 38/170  lung]
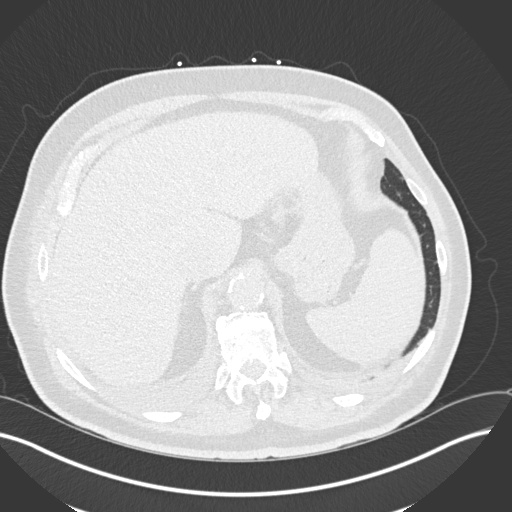
[im 51/170  lung]
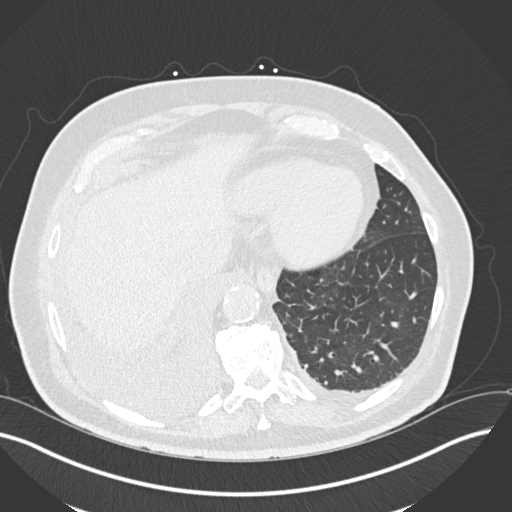
[im 63/170  mediastinal]
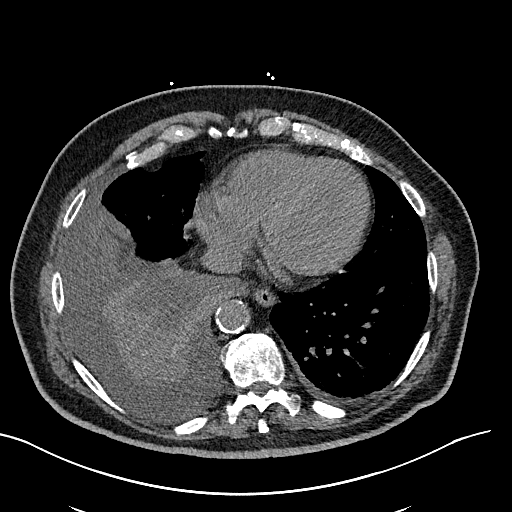
[im 63/170  lung]
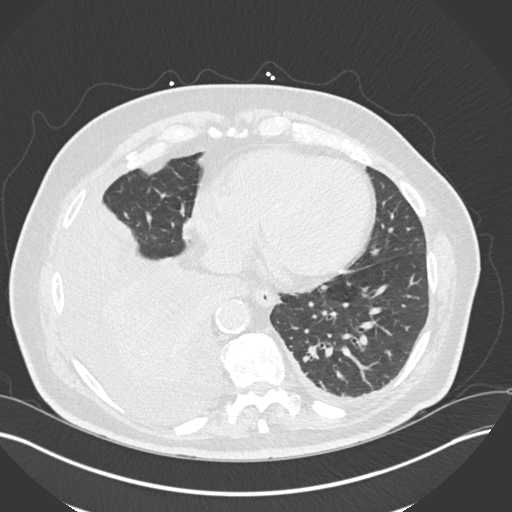
[im 76/170  lung]
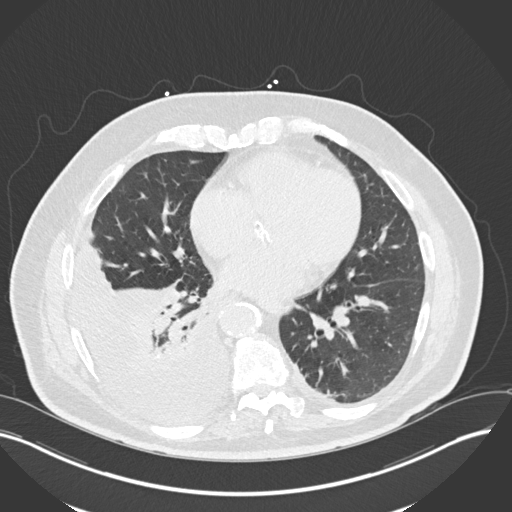
[im 94/170  lung]
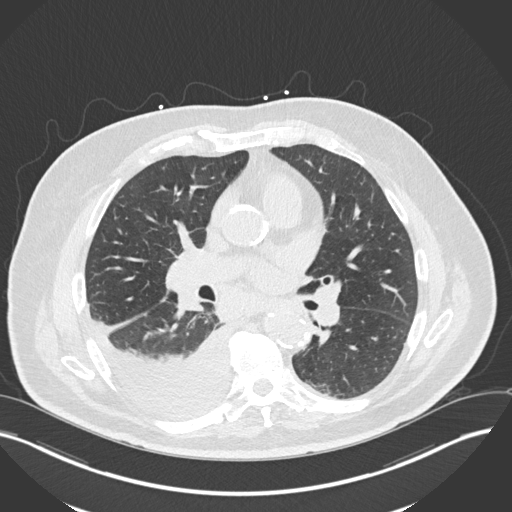
[im 107/170  lung]
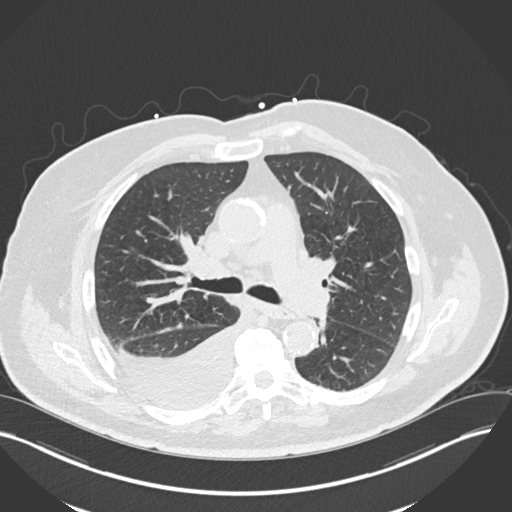
[im 119/170  mediastinal]
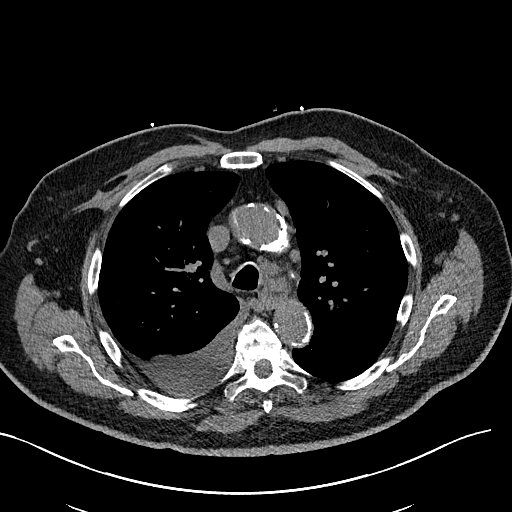
[im 119/170  lung]
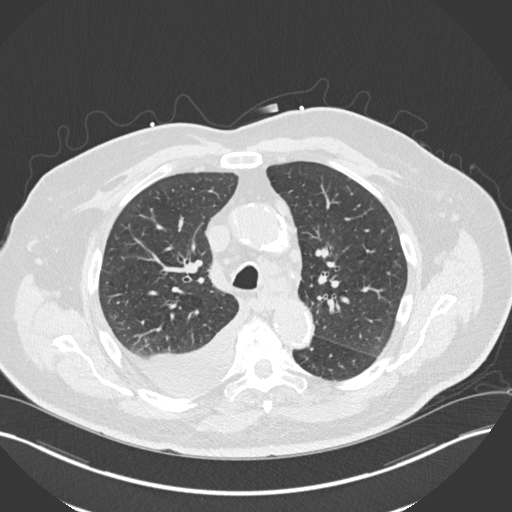
[im 132/170  lung]
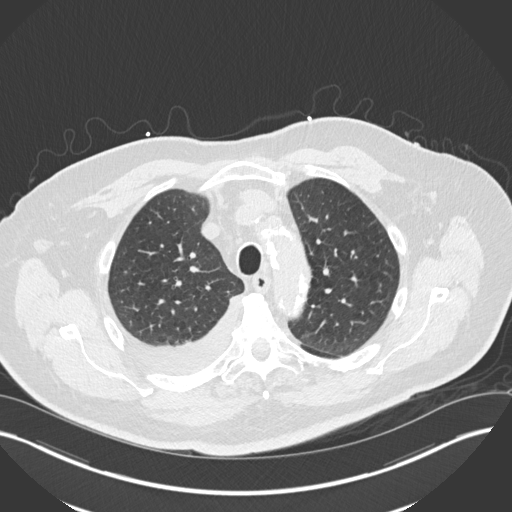
[im 144/170  lung]
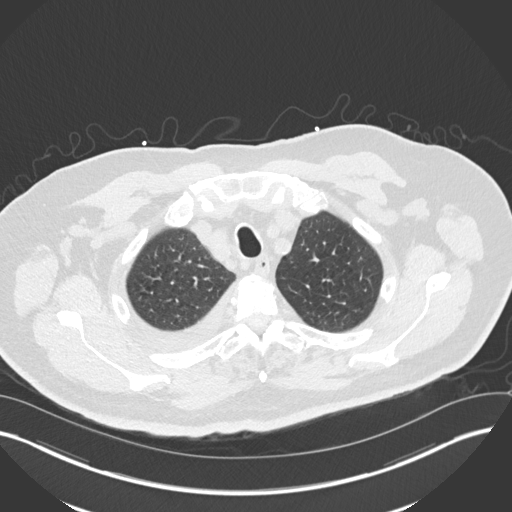
[im 157/170  lung]
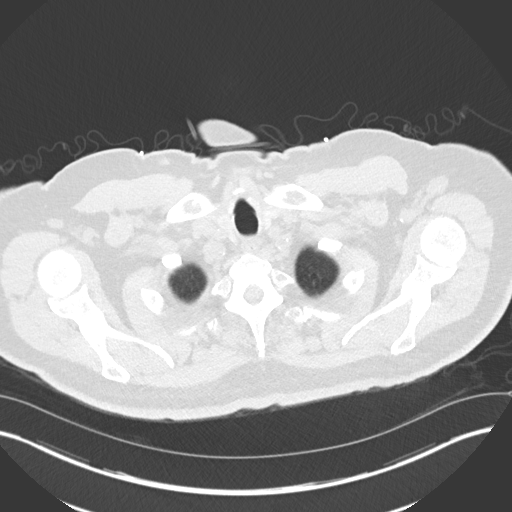

[Series 5: coronal · coronal · 0.71mm/px · 3 of 156 slices shown]
[im 32/156  lung]
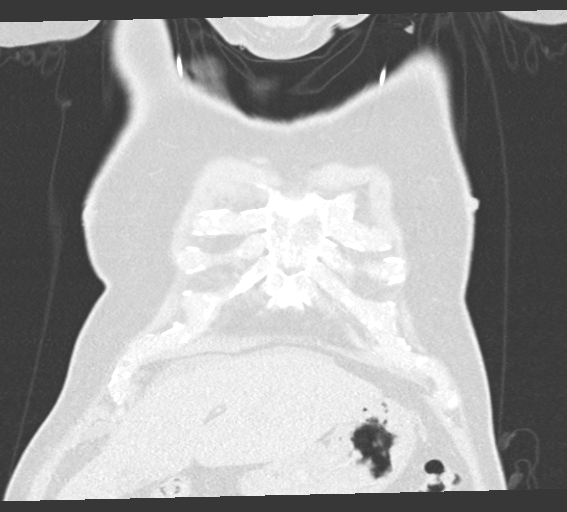
[im 63/156  lung]
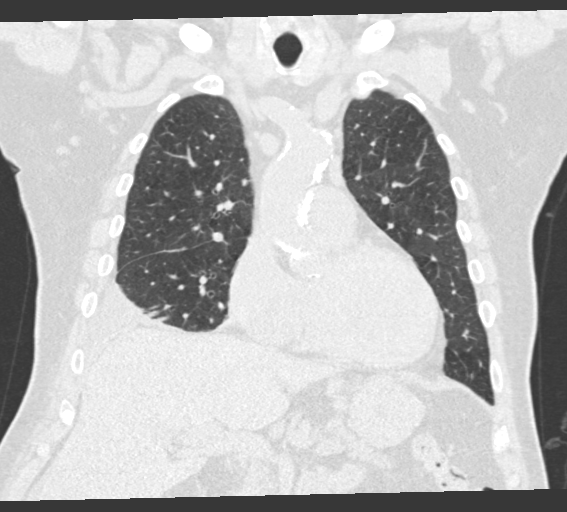
[im 94/156  lung]
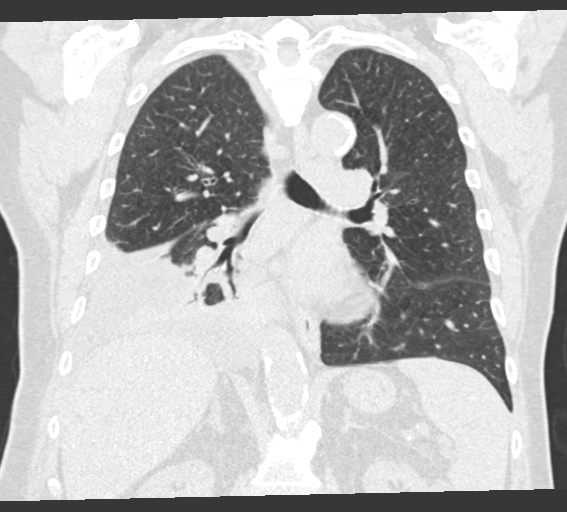

[15 of 36 positions shown; findings below may reference images not displayed]

FINDINGS: Cardiovascular: Mildly enlarged heart size. No pericardial effusion.
Heavy calcific atherosclerotic disease of the aorta and coronary
arteries. Tortuosity of the aorta.

Mediastinum/Nodes: Bilateral mediastinal lymph nodes with borderline
thickening measuring up to 1.5 cm short axis. Similarly mildly
prominent bilateral hilar lymph nodes.

Lungs/Pleura: Large in size right pleural effusion. Right lower lobe
peribronchial airspace consolidation. Minimal left pleural effusion.
Minimal interstitial pulmonary edema.

Upper Abdomen: Cholelithiasis. Visualized portion of the upper
abdominal organs otherwise normal.

Musculoskeletal: No chest wall mass or suspicious bone lesions
identified.
IMPRESSION: Mildly enlarged cardiac silhouette. Calcific atherosclerotic disease
of the coronary arteries.

Large right pleural effusion. Right lower lobe peribronchial
airspace consolidation may represent atelectasis or infectious
consolidation.

Tiny left pleural effusion.

Minimal interstitial pulmonary edema.

Mediastinal and hilar lymphadenopathy, which may be reactive or
malignant.

Aortic Atherosclerosis ([id number]-[id number]).

## 2018-04-28 IMAGING — CR DG CHEST 2V
2 series · 2 of 2 positions shown · non-contrast
Comparison: 01/13/2017

CLINICAL DATA: Chest pain

EXAM:
CHEST  2 VIEW

[chest lat]
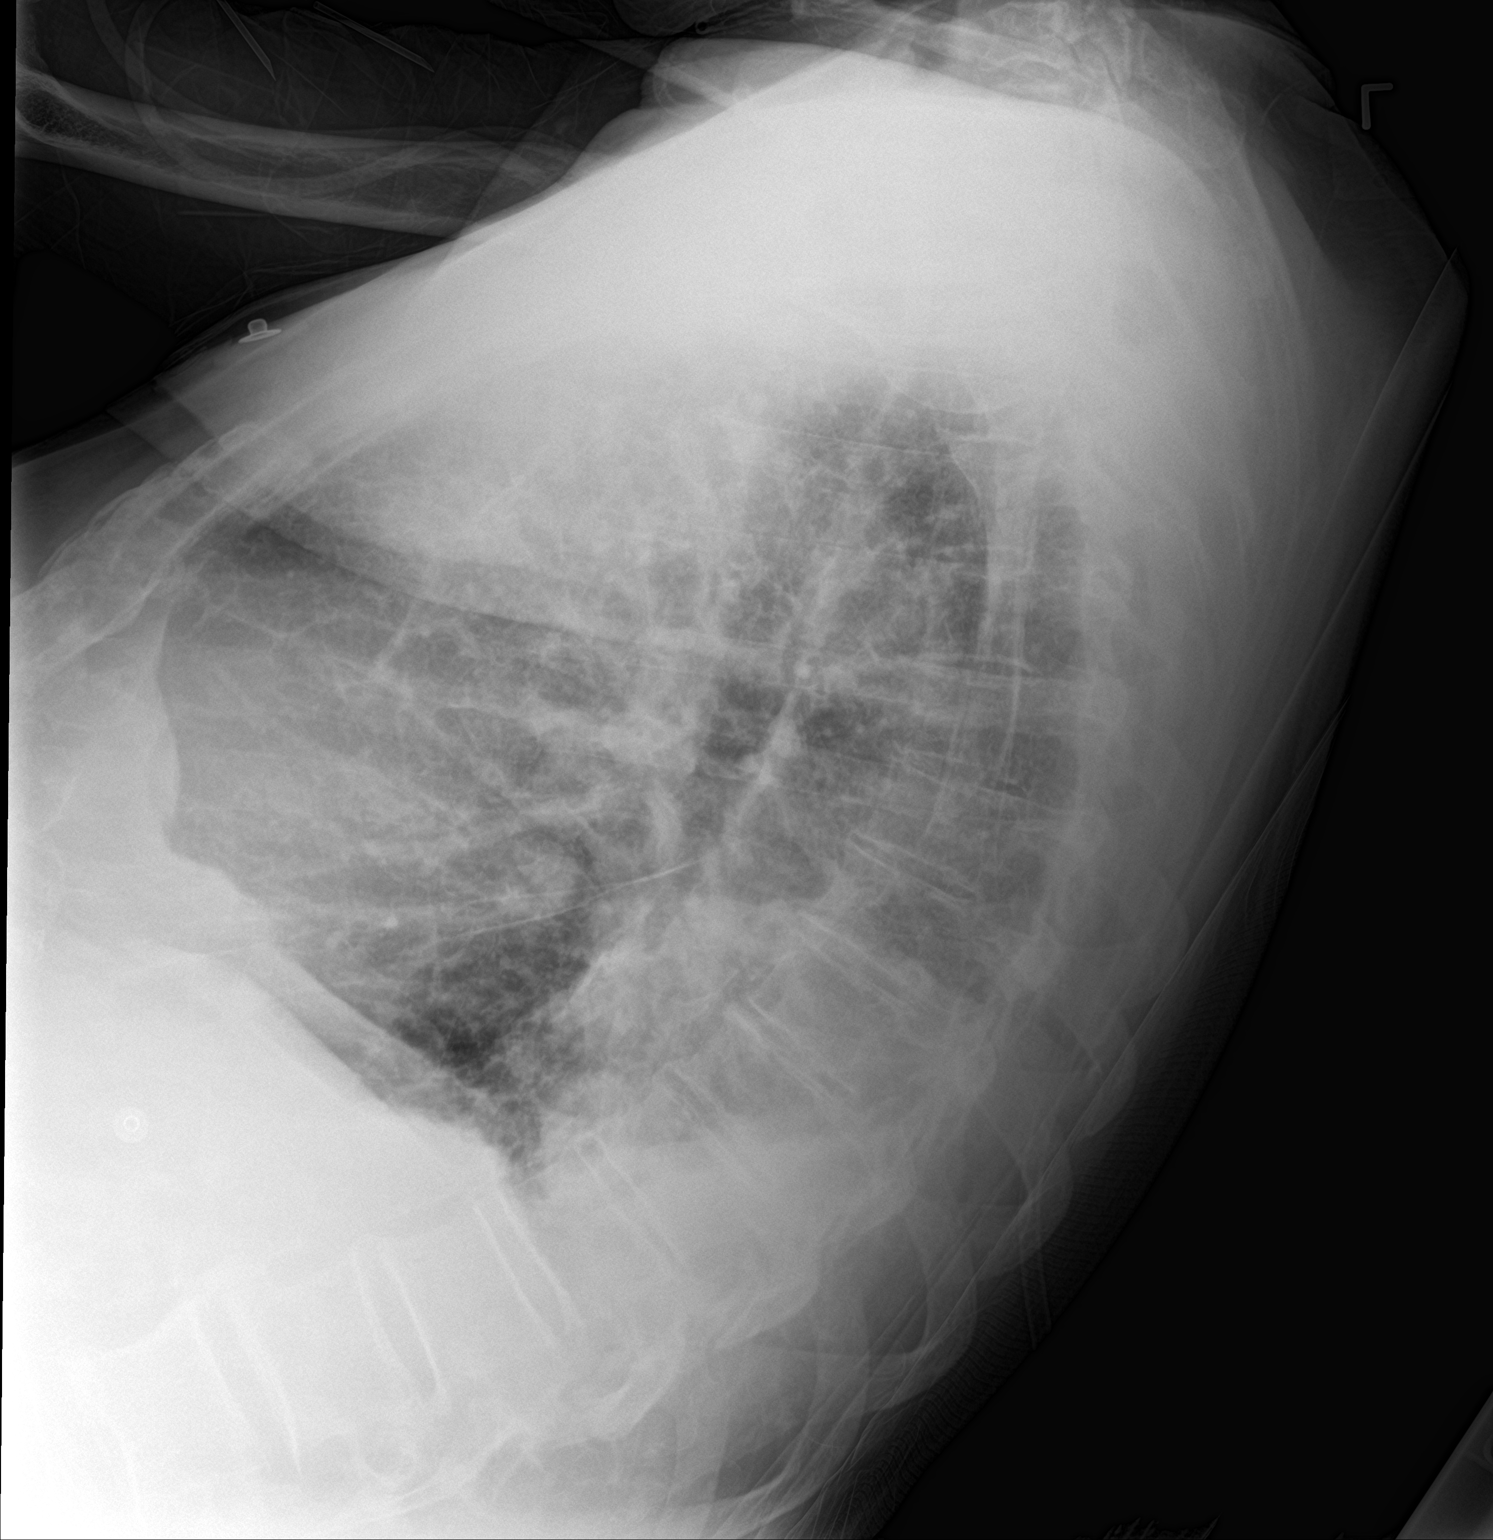

[chest ap]
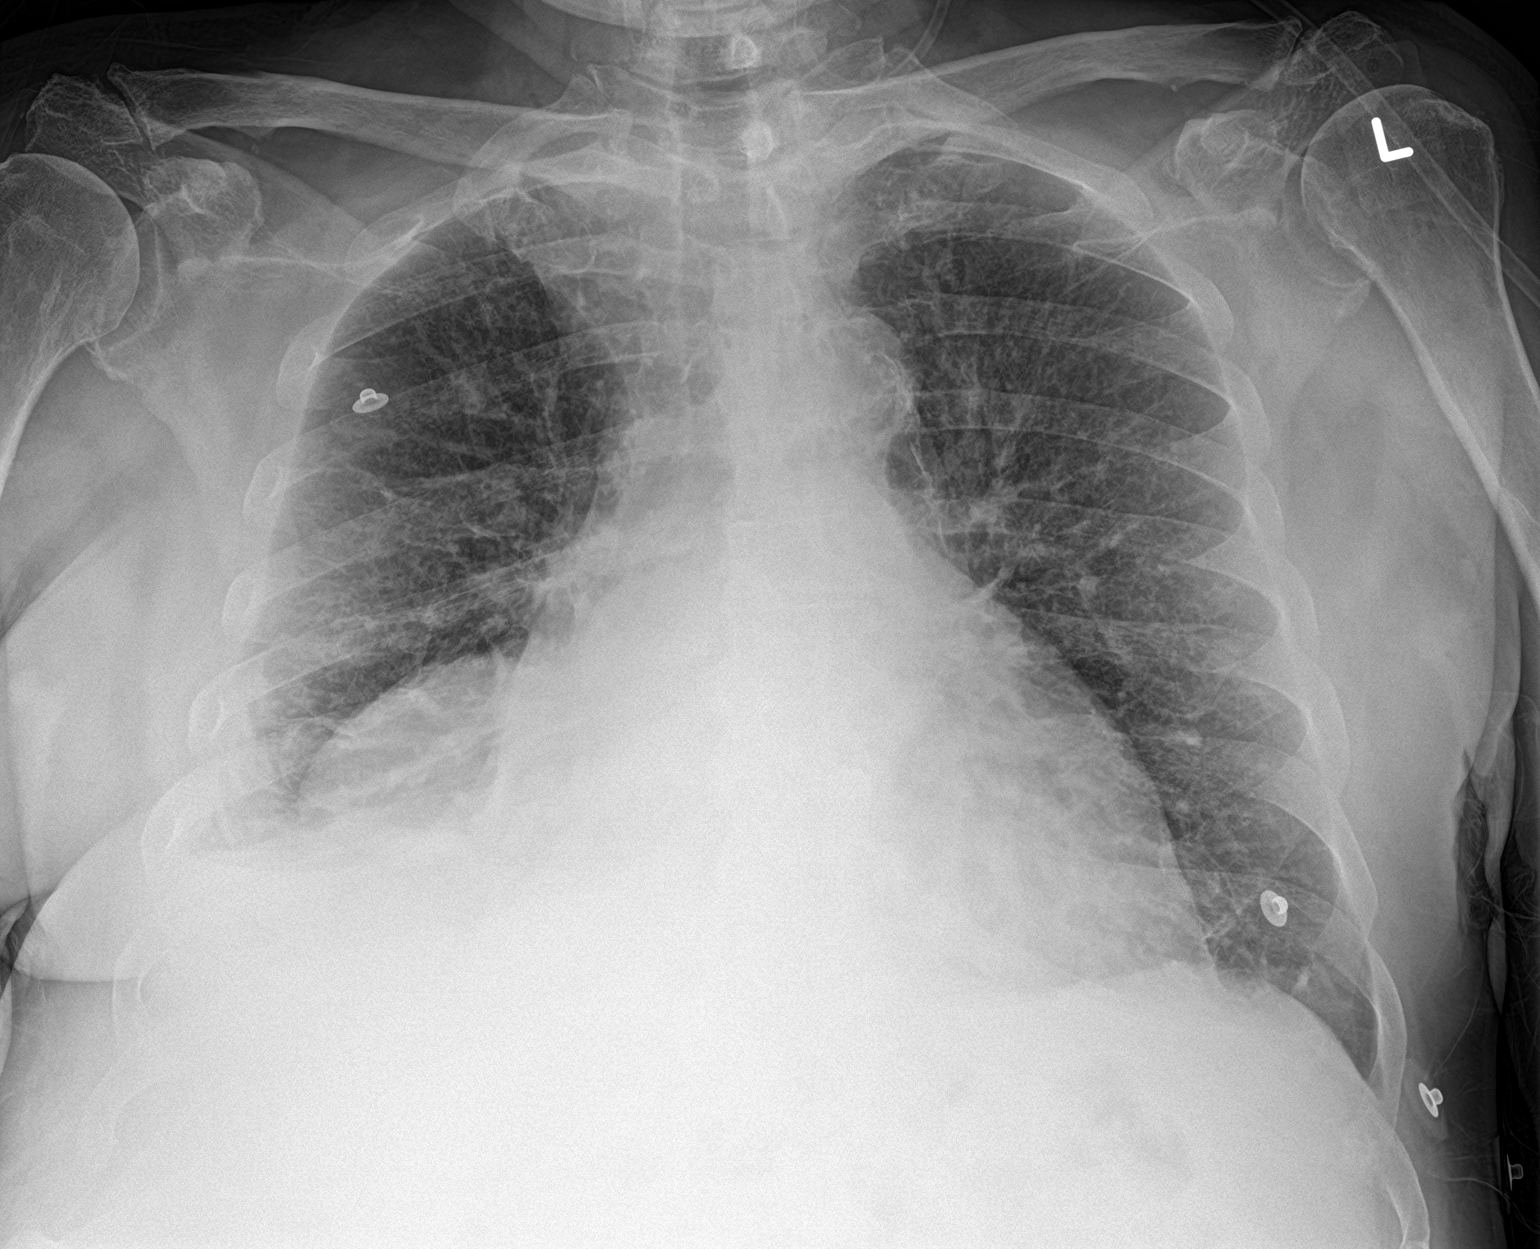

[2 of 2 positions shown; findings below may reference images not displayed]

FINDINGS: Moderate right pleural effusion with probable loculated component
posteriorly. Mild progression since the prior study. No left
effusion. Cardiac enlargement without edema. Bibasilar atelectasis.
IMPRESSION: Moderately large right pleural effusion with loculated component.
Mild progression from the prior study. Negative for edema.

## 2018-04-29 IMAGING — US US THORACENTESIS ASP PLEURAL SPACE W/IMG GUIDE
1 series · 5 of 5 positions shown · non-contrast
Comparison: none

INDICATION: Shortness of breath and right pleural effusion.

[Series 1: us thoracentesis asp pleural space w/img guide · 0.22mm/px · 5 of 5 slices shown]
[im 1/5]
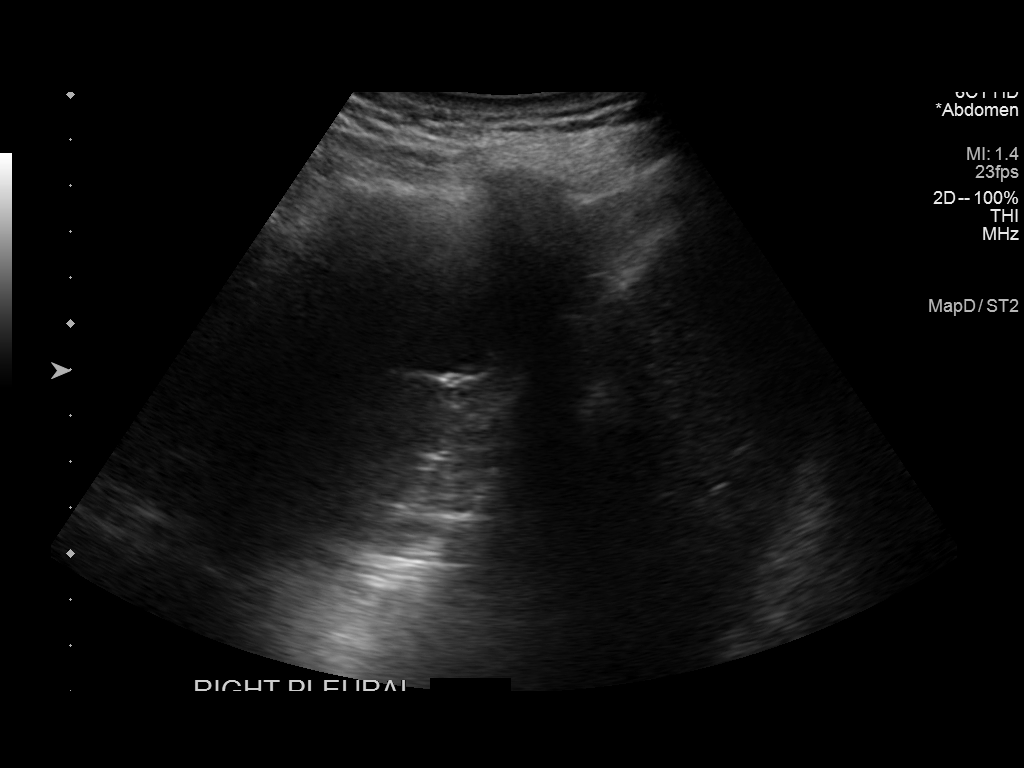
[im 2/5]
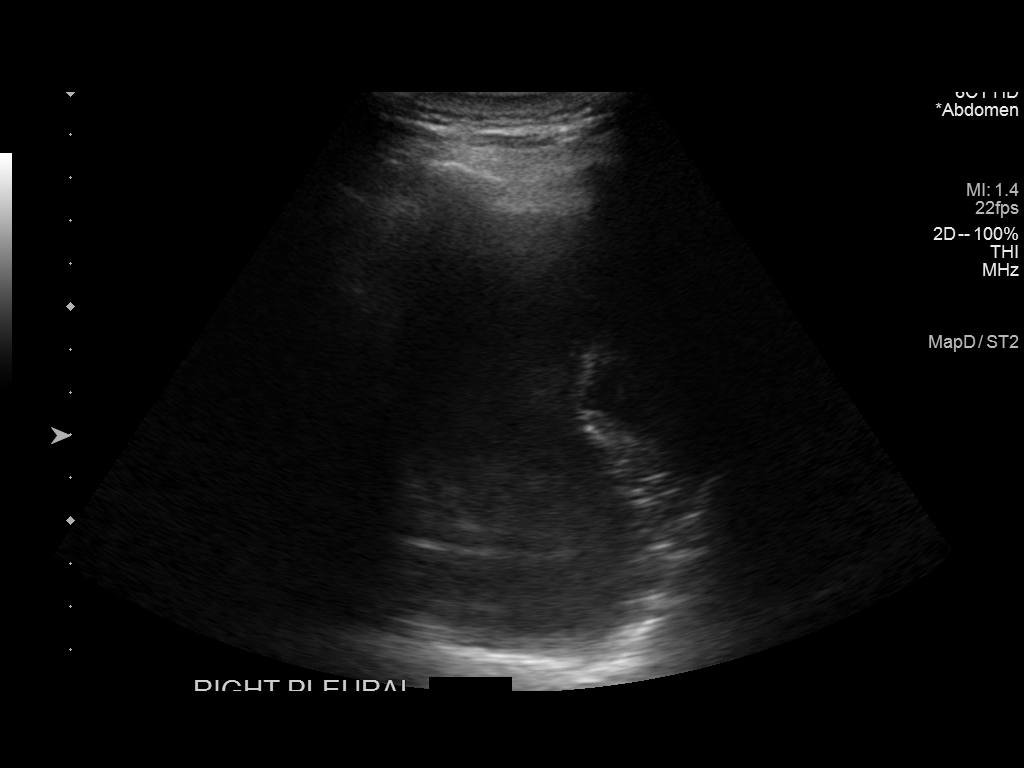
[im 3/5]
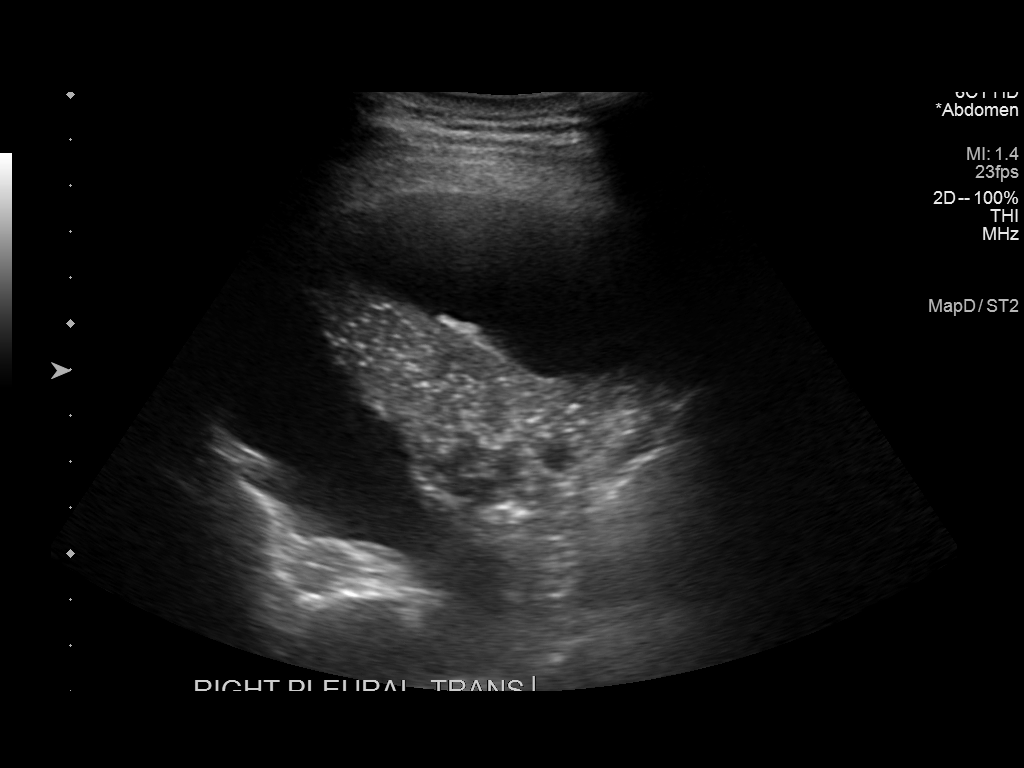
[im 4/5]
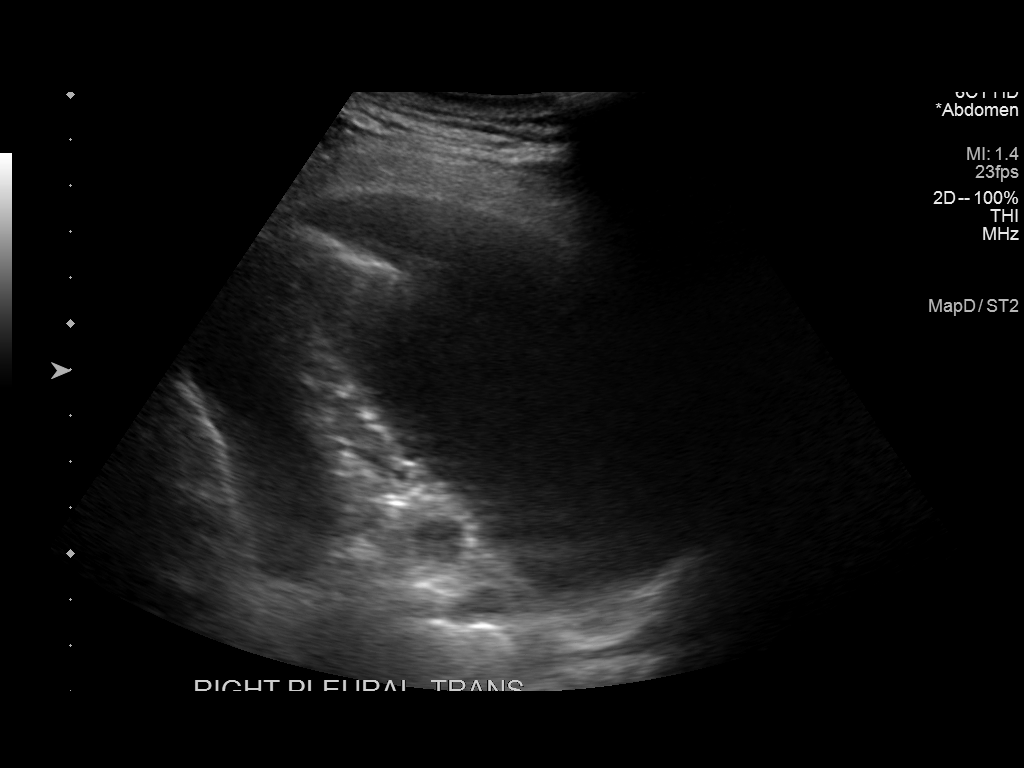
[im 5/5]
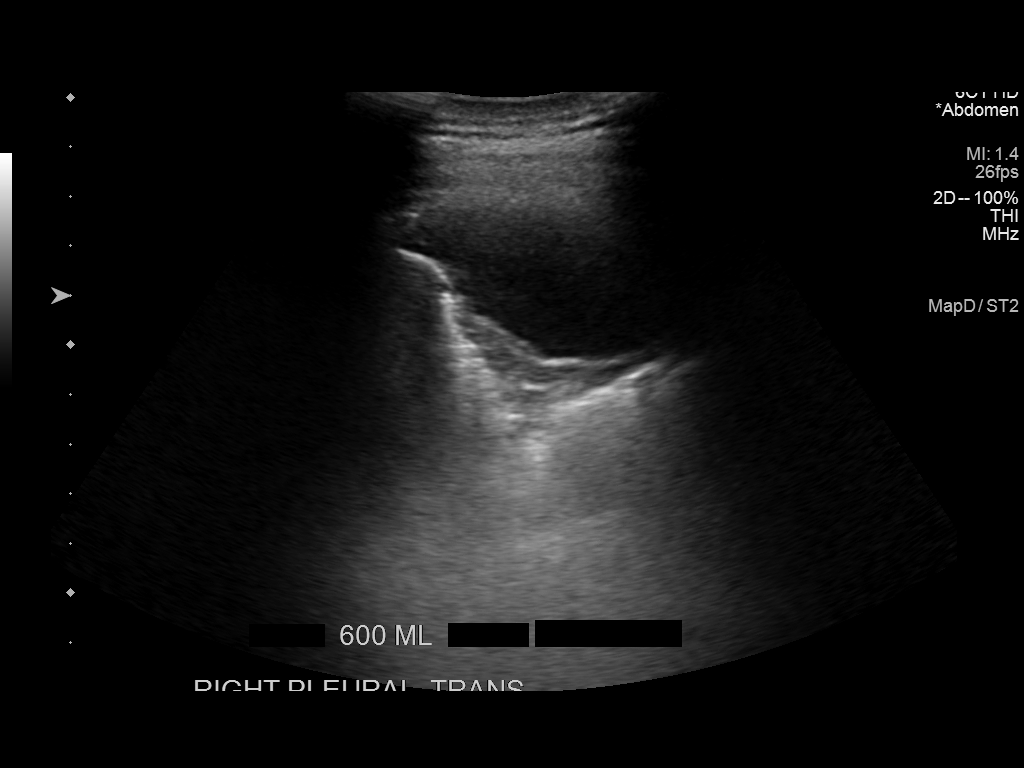

[5 of 5 positions shown; findings below may reference images not displayed]

EXAM:
ULTRASOUND GUIDED RIGHT THORACENTESIS

MEDICATIONS:
None.

COMPLICATIONS:
None immediate.

PROCEDURE:
An ultrasound guided thoracentesis was thoroughly discussed with the
patient and questions answered. The benefits, risks, alternatives
and complications were also discussed. The patient understands and
wishes to proceed with the procedure. Written consent was obtained.

Ultrasound was performed to localize and mark an adequate pocket of
fluid in the right chest. The area was then prepped and draped in
the normal sterile fashion. 1% Lidocaine was used for local
anesthesia. Under ultrasound guidance a 6 Fr Safe-T-Centesis
catheter was introduced. Thoracentesis was performed. The catheter
was removed and a dressing applied.
FINDINGS: A total of approximately 600 mL of yellow fluid was removed.

Small amount of fluid was remaining in the pleural space after the
tube stopped draining. Findings suggest that the pleural collection
is at least mildly loculated.
IMPRESSION: Successful ultrasound guided right thoracentesis yielding 600 mL of
pleural fluid.

Pleural collection appears to be mildly loculated.

## 2018-04-29 IMAGING — CR DG CHEST 1V
1 series · 1 of 1 positions shown · non-contrast
Comparison: Chest radiograph 01/27/2017.  Concurrent sonography.

CLINICAL DATA: Post thoracentesis.

EXAM:
CHEST 1 VIEW

[chest ap]
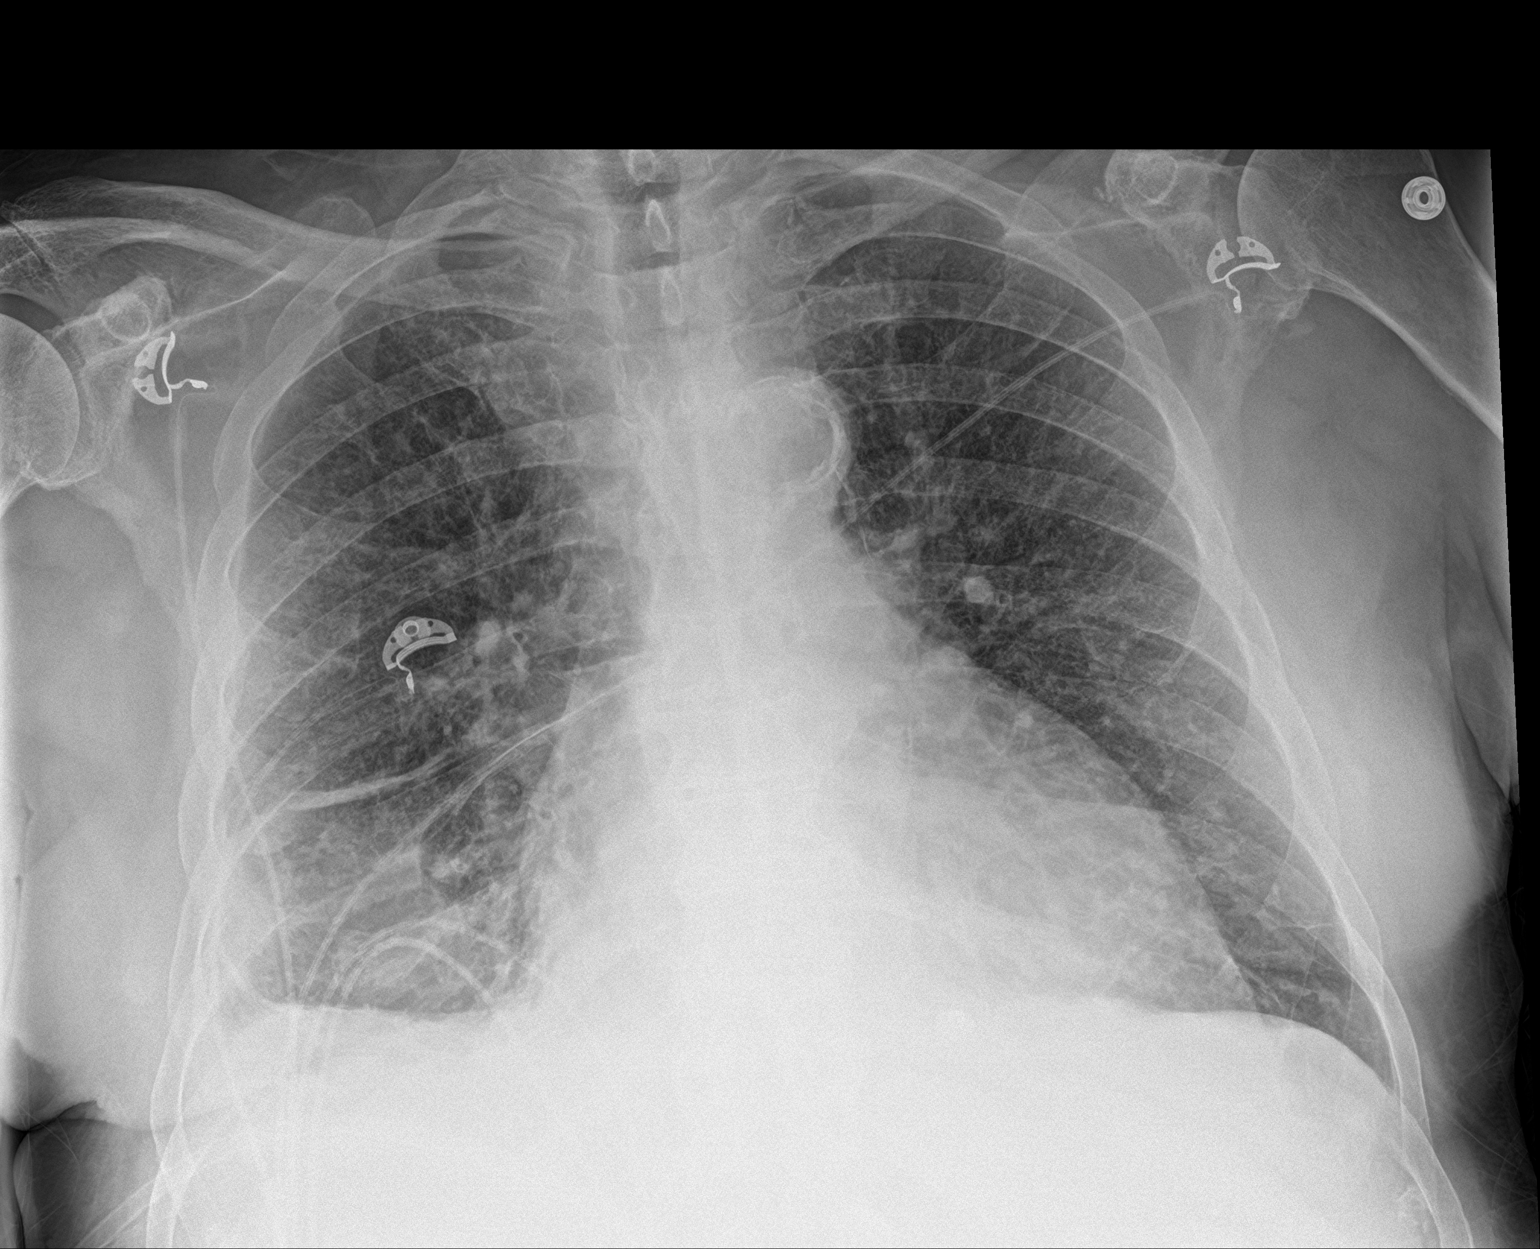

[1 of 1 positions shown; findings below may reference images not displayed]

FINDINGS: The heart remains enlarged. Extensive calcification of the thoracic
aorta. RIGHT pleural effusion is decreased. There is no
pneumothorax. Moderate vascular congestion persists. There is some
atelectasis at the RIGHT base.
IMPRESSION: Decreased RIGHT pleural effusion.  No pneumothorax.
